# Patient Record
Sex: Female | Born: 1960 | Race: White | Hispanic: No | Smoking: Never smoker
Health system: Southern US, Community
[De-identification: ages and names within clinical notes are randomized; demographics above are authoritative.]

## PROBLEM LIST (undated history)

## (undated) DIAGNOSIS — M810 Age-related osteoporosis without current pathological fracture: Secondary | ICD-10-CM

## (undated) DIAGNOSIS — M1990 Unspecified osteoarthritis, unspecified site: Secondary | ICD-10-CM

## (undated) DIAGNOSIS — F332 Major depressive disorder, recurrent severe without psychotic features: Secondary | ICD-10-CM

## (undated) DIAGNOSIS — D649 Anemia, unspecified: Secondary | ICD-10-CM

## (undated) HISTORY — PX: KNEE SURGERY: SHX244

## (undated) HISTORY — PX: HAND SURGERY: SHX662

## (undated) HISTORY — PX: NECK SURGERY: SHX720

## (undated) HISTORY — PX: FOOT SURGERY: SHX648

## (undated) HISTORY — PX: HIP FRACTURE SURGERY: SHX118

---

## 2008-05-29 ENCOUNTER — Other Ambulatory Visit: Payer: Self-pay | Admitting: Emergency Medicine

## 2008-05-29 ENCOUNTER — Ambulatory Visit: Payer: Self-pay | Admitting: Psychiatry

## 2008-05-29 ENCOUNTER — Inpatient Hospital Stay (HOSPITAL_COMMUNITY): Admission: RE | Admit: 2008-05-29 | Discharge: 2008-06-05 | Payer: Self-pay | Admitting: Psychiatry

## 2008-08-20 DIAGNOSIS — F1120 Opioid dependence, uncomplicated: Secondary | ICD-10-CM | POA: Insufficient documentation

## 2009-03-14 ENCOUNTER — Emergency Department (HOSPITAL_BASED_OUTPATIENT_CLINIC_OR_DEPARTMENT_OTHER): Admission: EM | Admit: 2009-03-14 | Discharge: 2009-03-14 | Payer: Self-pay | Admitting: Emergency Medicine

## 2009-03-14 ENCOUNTER — Ambulatory Visit: Payer: Self-pay | Admitting: Radiology

## 2009-08-10 ENCOUNTER — Ambulatory Visit: Payer: Self-pay | Admitting: Diagnostic Radiology

## 2009-08-10 ENCOUNTER — Emergency Department (HOSPITAL_BASED_OUTPATIENT_CLINIC_OR_DEPARTMENT_OTHER): Admission: EM | Admit: 2009-08-10 | Discharge: 2009-08-10 | Payer: Self-pay | Admitting: Emergency Medicine

## 2010-05-05 ENCOUNTER — Ambulatory Visit: Payer: Self-pay | Admitting: Diagnostic Radiology

## 2010-05-05 ENCOUNTER — Emergency Department (HOSPITAL_BASED_OUTPATIENT_CLINIC_OR_DEPARTMENT_OTHER): Admission: EM | Admit: 2010-05-05 | Discharge: 2010-05-06 | Payer: Self-pay | Admitting: Emergency Medicine

## 2010-05-14 ENCOUNTER — Emergency Department (HOSPITAL_BASED_OUTPATIENT_CLINIC_OR_DEPARTMENT_OTHER): Admission: EM | Admit: 2010-05-14 | Discharge: 2010-05-14 | Payer: Self-pay | Admitting: Emergency Medicine

## 2011-01-24 NOTE — H&P (Signed)
NAMEJAMMI, Tara Brown             ACCOUNT NO.:  [id number]   MEDICAL RECORD NO.:  [id number]          PATIENT TYPE:  IPS   LOCATION:  0302                          FACILITY:  [facility]   PHYSICIAN:  Anselm Jungling, MD  DATE OF BIRTH:  10-13-1960   DATE OF ADMISSION:  05/29/2008  DATE OF DISCHARGE:                       PSYCHIATRIC ADMISSION ASSESSMENT   IDENTIFYING INFORMATION:  This is a 50 year old divorced white female.  She presented at [facility] yesterday. She reports that she  started taking her Percocet too often. She has gotten several refills.  She doesn't trust herself and she is requesting a detox. The patient has  severe rheumatoid arthritis.  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   PAST PSYCHIATRIC HISTORY:  She denies having any.   SOCIAL HISTORY:  She reports having two bachelors and an associates  degree. She has been married and divorced once. She has no children.  She was last employed 3/41/2009.  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 She  does get SSI. She states that she is in the process of reworking with  vocational rehab.   FAMILY HISTORY:  Her father drank alcohol. Her own alcohol and drug  history; she was being given Vicodin. She did have a total knee  replacement in June of 2009. She was switched to Percocet and she  reports abusing the Percocet of late.   PRIMARY CARE [place]:  She has just moved to this area. She does not  have a primary care person yet.   MEDICAL PROBLEMS:  She does have severe rheumatoid arthritis. She is  status post right total knee replacement June of 2009 at [facility] in  [place].  She also has osteoporosis and currently some depression.   MEDICATIONS:  As already stated she was prescribed Percocet 5/325 one to  two q8 hours prn. We are going to stop that and Fosamax 70 mg po weekly.  She takes it on  Fridays. She was last due yesterday and so we will need  to give her that.   DRUG ALLERGIES:  No known drug allergies.   POSITIVE PHYSICAL FINDINGS:  She does have multiple joint involvement  with her rheumatoid arthritis in her hands and feet. She has an old scar  in her left hip area and her right knee. She is also status post a  cervical fusion and right knee replacement. She has had an iliac repair  and she has had a broken hip times three, the last being in 1996.  She  is also anemic and hemoglobin today is 9. Her vital signs on admission  to our unit shows she is 67 tall, weighs 142, temperature is 98.4,  blood pressure 126/81 to 134/80, pulse is 69 to 78, respirations are 16.   MENTAL STATUS EXAM:  Today she is alert and oriented.  She is casually  but appropriately groomed, dressed and appears to be adequately hydrated  and nourished. Her speech was soft and slow. Her mood  is appropriate to  the situation. Her affect had a normal range. Her thought processes are  clear, rational and goal oriented. She wants to get back to work.  Judgment and insight are good. Concentration and memory are intact.  Intelligence is at least average.  She denies being suicidal or  homicidal. She denies auditory or visual hallucinations.   Axis I:  Opioid abuse, rule out dependence.  Grief reaction, mother just  died.  Depression from chronic medical illnesses.  Axis II:  Deferred.  Axis III:  Recent total knee surgery June of 2009 at [place] in  [place]. Rheumatoid arthritis with severe joint involvement of hands  and feet.  Osteoporosis. Fracture of left hip times three and cervical  fusion.  Axis IV:  Moderate. She has just moved to this area so she doesn't have  a lot of support although she does have a sister that lives in [place].  Axis V:  60.   The plan is to admit for help to support through detox from opiates.  Toward that end she was started on the Clonidine protocol. She had   already spoken with Dr. Dub Mikes and they had agreed to start some Cymbalta  to help with her depression and pain. Estimated length of stay is five  days.      Mickie Leonarda Salon, P.A.-C.      Anselm Jungling, MD  Electronically Signed    MD/MEDQ  D:  05/30/2008  T:  05/30/2008  Job:  [id number]

## 2011-01-27 NOTE — Discharge Summary (Signed)
NAMEBRISSIA, Tara NO.:  [id number]   MEDICAL RECORD NO.:  [id number]          PATIENT TYPE:  IPS   LOCATION:  0302                          FACILITY:  [facility]   PHYSICIAN:  Anselm Jungling, MD  DATE OF BIRTH:  [birth year]   DATE OF ADMISSION:  05/29/2008  DATE OF DISCHARGE:  06/05/2008                               DISCHARGE SUMMARY   IDENTIFYING DATA/REASON FOR ADMISSION:  The patient is a 50 year old  divorced Caucasian female who had presented at [facility],  reporting that she was overusing her prescription Percocet.  She  requested detoxification.  She had been taking Percocet for rheumatoid  arthritis.  Please refer to the admission note for further details  pertaining to the symptoms, circumstances and history that led to  hospitalization.  She was given an initial Axis I diagnosis of opiate  abuse, rule out dependence.   MEDICAL AND LABORATORY:  The patient was medically and physically  assessed by the psychiatric physician's assistant.  She was continued on  her usual Fosamax, but instructed to stop taking Indocin because of  concern about gastrointestinal bleeding.  At the time of discharge, the  patient was instructed to follow up with her rheumatologist in [facility], with an appointment on November 2.  She was also referred to an  orthopedist, Dr. Lequita Halt at [facility] for followup care  regarding orthopedic issues.   HOSPITAL COURSE:  The patient was admitted to the adult inpatient  psychiatric service.  She presented as a well-nourished, normally-  developed adult female who is alert and oriented.  Her mood was  appropriate and her affect showed normal range.  Her thought processes  were clear and rational.  Judgment and insight were felt to be good.   She was placed on a withdrawal protocol based upon [place].  She was  involved in the therapeutic milieu.  She was a good participant in the  treatment program.  She was  initially seen by Dr. Geoffery Lyons, and  following this, the undersigned assumed her care.  She was continued on  Cymbalta, and tolerated this without side effects.  Case management  worked with the patient closely, and family session occurred involving  the patient and her sister.   By the sixth hospital day, the patient stated that she felt a lot  better.  She also felt encouraged because her sister had been very  supportive in the family meeting.  Sleep and appetite were improving.   The patient was discharged on the eighth hospital day.  At that time she  had completed the detoxification process, and was stable with respect to  mood and thinking.   She was determined to have some low blood indices suggesting anemia, and  the possibility of GI bleeding.  As such, she was instructed to stop  Indocin and referred to appropriate medical followup as referred to  above.   AFTERCARE:  The patient was to follow up for mental health issues with  the [facility] in [facility] with an appointment on September 28.  She was also referred to  [facility], appointment to  be arranged by the patient.   DISCHARGE MEDICATIONS:  1. Cymbalta 30 mg daily.  2. Fosamax 70 mg every Friday.  3. Iron and vitamin supplements daily.  4. Ambien 10 mg at bedtime as needed for sleep.  The patient was      instructed to stop Indocin.   DISCHARGE DIAGNOSES:  Axis I: Opioid abuse, status post opioid  withdrawal.  Axis II: Deferred.  Axis III: Rheumatoid arthritis, status post joint replacement, chronic  pain NOS and anemia, NOS.  Axis IV: Stressors severe.  Axis V: GAF on discharge 60.      Anselm Jungling, MD  Electronically Signed     SPB/MEDQ  D:  06/17/2008  T:  06/17/2008  Job:  [id number]

## 2011-02-23 ENCOUNTER — Ambulatory Visit: Payer: Self-pay | Admitting: Physical Therapy

## 2011-02-28 ENCOUNTER — Ambulatory Visit: Payer: Self-pay | Admitting: Physical Therapy

## 2011-03-02 ENCOUNTER — Emergency Department (INDEPENDENT_AMBULATORY_CARE_PROVIDER_SITE_OTHER): Payer: Medicare Other

## 2011-03-02 ENCOUNTER — Emergency Department (HOSPITAL_BASED_OUTPATIENT_CLINIC_OR_DEPARTMENT_OTHER)
Admission: EM | Admit: 2011-03-02 | Discharge: 2011-03-02 | Disposition: A | Discharge status: Home / Self Care | Payer: Medicare Other | Attending: Emergency Medicine | Admitting: Emergency Medicine

## 2011-03-02 DIAGNOSIS — M79609 Pain in unspecified limb: Secondary | ICD-10-CM

## 2011-03-02 DIAGNOSIS — F341 Dysthymic disorder: Secondary | ICD-10-CM | POA: Insufficient documentation

## 2011-03-02 DIAGNOSIS — M84369A Stress fracture, unspecified tibia and fibula, initial encounter for fracture: Secondary | ICD-10-CM

## 2011-03-02 DIAGNOSIS — X58XXXA Exposure to other specified factors, initial encounter: Secondary | ICD-10-CM

## 2011-03-02 DIAGNOSIS — Z8739 Personal history of other diseases of the musculoskeletal system and connective tissue: Secondary | ICD-10-CM | POA: Insufficient documentation

## 2011-03-02 DIAGNOSIS — M810 Age-related osteoporosis without current pathological fracture: Secondary | ICD-10-CM | POA: Insufficient documentation

## 2011-03-02 DIAGNOSIS — S82209A Unspecified fracture of shaft of unspecified tibia, initial encounter for closed fracture: Secondary | ICD-10-CM | POA: Insufficient documentation

## 2011-06-12 LAB — DIFFERENTIAL
Basophils Absolute: 0
Basophils Relative: 0
Eosinophils Absolute: 0
Eosinophils Relative: 0
Lymphocytes Relative: 15
Lymphs Abs: 1.1
Monocytes Absolute: 0.4
Monocytes Relative: 5
Neutro Abs: 6.1
Neutrophils Relative %: 80 — ABNORMAL HIGH

## 2011-06-12 LAB — BASIC METABOLIC PANEL WITH GFR
CO2: 25
Calcium: 9.5
Chloride: 105
Creatinine, Ser: 0.8
Glucose, Bld: 109 — ABNORMAL HIGH
Sodium: 141

## 2011-06-12 LAB — BASIC METABOLIC PANEL
BUN: 11
GFR calc Af Amer: >60
GFR calc non Af Amer: >60
Potassium: 4.1

## 2011-06-12 LAB — CBC
HCT: 27.3 — ABNORMAL LOW
Hemoglobin: 8.9 — ABNORMAL LOW
MCHC: 32.5
MCV: 70.6 — ABNORMAL LOW
Platelets: 383
RBC: 3.86 — ABNORMAL LOW
RDW: 18 — ABNORMAL HIGH
WBC: 7.6

## 2011-06-12 LAB — POCT TOXICOLOGY PANEL: Opiates: POSITIVE

## 2011-06-12 LAB — ETHANOL: Alcohol, Ethyl (B): <10

## 2013-10-07 ENCOUNTER — Emergency Department (HOSPITAL_BASED_OUTPATIENT_CLINIC_OR_DEPARTMENT_OTHER): Payer: Medicare Other

## 2013-10-07 ENCOUNTER — Emergency Department (HOSPITAL_BASED_OUTPATIENT_CLINIC_OR_DEPARTMENT_OTHER)
Admission: EM | Admit: 2013-10-07 | Discharge: 2013-10-07 | Disposition: A | Discharge status: Home / Self Care | Payer: Medicare Other | Attending: Emergency Medicine | Admitting: Emergency Medicine

## 2013-10-07 ENCOUNTER — Encounter (HOSPITAL_BASED_OUTPATIENT_CLINIC_OR_DEPARTMENT_OTHER): Payer: Self-pay | Admitting: Emergency Medicine

## 2013-10-07 DIAGNOSIS — Z8781 Personal history of (healed) traumatic fracture: Secondary | ICD-10-CM | POA: Insufficient documentation

## 2013-10-07 DIAGNOSIS — M25469 Effusion, unspecified knee: Secondary | ICD-10-CM | POA: Insufficient documentation

## 2013-10-07 DIAGNOSIS — M1990 Unspecified osteoarthritis, unspecified site: Secondary | ICD-10-CM

## 2013-10-07 DIAGNOSIS — M25569 Pain in unspecified knee: Secondary | ICD-10-CM | POA: Insufficient documentation

## 2013-10-07 DIAGNOSIS — M25562 Pain in left knee: Secondary | ICD-10-CM

## 2013-10-07 DIAGNOSIS — M069 Rheumatoid arthritis, unspecified: Secondary | ICD-10-CM | POA: Insufficient documentation

## 2013-10-07 HISTORY — DX: Unspecified osteoarthritis, unspecified site: M19.90

## 2013-10-07 HISTORY — DX: Age-related osteoporosis without current pathological fracture: M81.0

## 2013-10-07 MED ORDER — OXYCODONE-ACETAMINOPHEN 5-325 MG PO TABS: 1.0000 | ORAL_TABLET | ORAL | Status: DC | PRN

## 2013-10-07 MED ORDER — MORPHINE SULFATE 4 MG/ML IJ SOLN
4.0000 mg | Freq: Once | INTRAMUSCULAR | Status: AC
Start: 2013-10-07 — End: 2013-10-07
  Administered 2013-10-07: 4 mg via INTRAMUSCULAR
  Filled 2013-10-07: qty 1

## 2013-10-07 NOTE — ED Notes (Signed)
C/o pain to left knee while walking-feels possible fractured due to hx severe osteoporosis

## 2013-10-07 NOTE — ED Provider Notes (Signed)
TIME SEEN: 4:15 PM  CHIEF COMPLAINT: Left knee pain  HPI: Patient is a 53 y.o. female with a history of severe osteoporosis and rheumatoid arthritis who has had multiple fractures requiring surgery who presents the emergency room with sudden onset left knee pain while walking. No history of injury. She's had swelling to the knee. No redness or warmth. No fevers or chills. No numbness, tingling or focal weakness. She's able to bear weight but it is painful. Pain radiates to the posterior knee. Pain is worse with walking. Better with rest. Described a sharp, throbbing, moderate.  ROS: See HPI Constitutional: no fever  Eyes: no drainage  ENT: no runny nose   Cardiovascular:  no chest pain  Resp: no SOB  GI: no vomiting GU: no dysuria Integumentary: no rash  Allergy: no hives  Musculoskeletal: no leg swelling  Neurological: no slurred speech ROS otherwise negative  PAST MEDICAL HISTORY/PAST SURGICAL HISTORY:  Past Medical History  Diagnosis Date  . Osteoporosis   . Arthritis   . Rheumatoid arthritis     MEDICATIONS:  Prior to Admission medications   Medication Sig Start Date End Date Taking? Authorizing Provider  Escitalopram Oxalate (LEXAPRO PO) Take by mouth.   Yes Historical Provider, MD  Etanercept (ENBREL [place]) Inject into the skin.   Yes Historical Provider, MD  LISINOPRIL PO Take by mouth.   Yes Historical Provider, MD  Omeprazole (PRILOSEC PO) Take by mouth.   Yes Historical Provider, MD  PREDNISONE PO Take by mouth.   Yes Historical Provider, MD  TRAMADOL HCL PO Take by mouth.   Yes Historical Provider, MD    ALLERGIES:  No Known Allergies  SOCIAL HISTORY:  History  Substance Use Topics  . Smoking status: Never Smoker   . Smokeless tobacco: Not on file  . Alcohol Use: No    FAMILY HISTORY: No family history on file.  EXAM: BP 127/66  Pulse 64  Temp(Src) 98.2 F (36.8 C) (Oral)  Resp 18  Ht 5\' 7"  (1.702 m)  Wt 170 lb (77.111 kg)  BMI 26.62 kg/m2  SpO2  97% CONSTITUTIONAL: Alert and oriented and responds appropriately to questions. Well-appearing; well-nourished HEAD: Normocephalic EYES: Conjunctivae clear, PERRL ENT: normal nose; no rhinorrhea; moist mucous membranes; pharynx without lesions noted NECK: Supple, no meningismus, no LAD  CARD: RRR; S1 and S2 appreciated; no murmurs, no clicks, no rubs, no gallops RESP: Normal chest excursion without splinting or tachypnea; breath sounds clear and equal bilaterally; no wheezes, no rhonchi, no rales,  ABD/GI: Normal bowel sounds; non-distended; soft, non-tender, no rebound, no guarding BACK:  The back appears normal and is non-tender to palpation, there is no CVA tenderness EXT: Vision is tenderness palpation of the left anterior knee with moderate joint effusion with no erythema or warmth or induration or fluctuance, no ligamentous laxity, 2+ DP pulses bilaterally, sensation to light touch intact diffusely, patient has full range of motion in the left knee but full extension and flexion cause significant pain, otherwise Normal ROM in all joints; non-tender to palpation; no edema; normal capillary refill; no cyanosis    SKIN: Normal color for age and race; warm NEURO: Moves all extremities equally PSYCH: The patient's mood and manner are appropriate. Grooming and personal hygiene are appropriate.  MEDICAL DECISION MAKING: Patient here with left knee pain. She has severe arthritis and joint effusion but no signs of septic arthritis. X-ray shows no fracture. Patient is very concerned that she may have an acute fracture not seen on x-ray  given her severe osteoporosis. We'll give pain medication and obtain CT scan to evaluate for fracture  ED PROGRESS: Patient CT scan shows tricompartmental also arthritis but no fracture or dislocation. There is also moderate joint effusion. No subcutaneous air. Patient is very pleased she doesn't have a fracture. We'll discharge him with return precautions. She has an  orthopedist for followup. She has crutches at home. We'll give pain medication. Given return precautions. Patient verbalizes understanding and is comfortable with plan.     Layla Maw Rashana Andrew, DO 10/07/13 1724

## 2013-10-07 NOTE — Discharge Instructions (Signed)
Arthritis, Nonspecific Arthritis is inflammation of a joint. This usually means pain, redness, warmth or swelling are present. One or more joints may be involved. There are a number of types of arthritis. Your caregiver may not be able to tell what type of arthritis you have right away. CAUSES  The most common cause of arthritis is the wear and tear on the joint (osteoarthritis). This causes damage to the cartilage, which can break down over time. The knees, hips, back and neck are most often affected by this type of arthritis. Other types of arthritis and common causes of joint pain include:  Sprains and other injuries near the joint. Sometimes minor sprains and injuries cause pain and swelling that develop hours later.  Rheumatoid arthritis. This affects hands, feet and knees. It usually affects both sides of your body at the same time. It is often associated with chronic ailments, fever, weight loss and general weakness.  Crystal arthritis. Gout and pseudo gout can cause occasional acute severe pain, redness and swelling in the foot, ankle, or knee.  Infectious arthritis. Bacteria can get into a joint through a break in overlying skin. This can cause infection of the joint. Bacteria and viruses can also spread through the blood and affect your joints.  Drug, infectious and allergy reactions. Sometimes joints can become mildly painful and slightly swollen with these types of illnesses. SYMPTOMS   Pain is the main symptom.  Your joint or joints can also be red, swollen and warm or hot to the touch.  You may have a fever with certain types of arthritis, or even feel overall ill.  The joint with arthritis will hurt with movement. Stiffness is present with some types of arthritis. DIAGNOSIS  Your caregiver will suspect arthritis based on your description of your symptoms and on your exam. Testing may be needed to find the type of arthritis:  Blood and sometimes urine tests.  X-ray tests  and sometimes CT or MRI scans.  Removal of fluid from the joint (arthrocentesis) is done to check for bacteria, crystals or other causes. Your caregiver (or a specialist) will numb the area over the joint with a local anesthetic, and use a needle to remove joint fluid for examination. This procedure is only minimally uncomfortable.  Even with these tests, your caregiver may not be able to tell what kind of arthritis you have. Consultation with a specialist (rheumatologist) may be helpful. TREATMENT  Your caregiver will discuss with you treatment specific to your type of arthritis. If the specific type cannot be determined, then the following general recommendations may apply. Treatment of severe joint pain includes:  Rest.  Elevation.  Anti-inflammatory medication (for example, ibuprofen) may be prescribed. Avoiding activities that cause increased pain.  Only take over-the-counter or prescription medicines for pain and discomfort as recommended by your caregiver.  Cold packs over an inflamed joint may be used for 10 to 15 minutes every hour. Hot packs sometimes feel better, but do not use overnight. Do not use hot packs if you are diabetic without your caregiver's permission.  A cortisone shot into arthritic joints may help reduce pain and swelling.  Any acute arthritis that gets worse over the next 1 to 2 days needs to be looked at to be sure there is no joint infection. Long-term arthritis treatment involves modifying activities and lifestyle to reduce joint stress jarring. This can include weight loss. Also, exercise is needed to nourish the joint cartilage and remove waste. This helps keep the muscles  around the joint strong. HOME CARE INSTRUCTIONS   Do not take aspirin to relieve pain if gout is suspected. This elevates uric acid levels.  Only take over-the-counter or prescription medicines for pain, discomfort or fever as directed by your caregiver.  Rest the joint as much as  possible.  If your joint is swollen, keep it elevated.  Use crutches if the painful joint is in your leg.  Drinking plenty of fluids may help for certain types of arthritis.  Follow your caregiver's dietary instructions.  Try low-impact exercise such as:  Swimming.  Water aerobics.  Biking.  Walking.  Morning stiffness is often relieved by a warm shower.  Put your joints through regular range-of-motion. SEEK MEDICAL CARE IF:   You do not feel better in 24 hours or are getting worse.  You have side effects to medications, or are not getting better with treatment. SEEK IMMEDIATE MEDICAL CARE IF:   You have a fever.  You develop severe joint pain, swelling or redness.  Many joints are involved and become painful and swollen.  There is severe back pain and/or leg weakness.  You have loss of bowel or bladder control. Document Released: 10/05/2004 Document Revised: 11/20/2011 Document Reviewed: 10/21/2008 [facility] Patient Information 2014 [place].  Knee Effusion The medical term for having fluid in your knee is effusion. This is often due to an internal derangement of the knee. This means something is wrong inside the knee. Some of the causes of fluid in the knee may be torn cartilage, a torn ligament, or bleeding into the joint from an injury. Your knee is likely more difficult to bend and move. This is often because there is increased pain and pressure in the joint. The time it takes for recovery from a knee effusion depends on different factors, including:   Type of injury.  Your age.  Physical and medical conditions.  Rehabilitation Strategies. How long you will be away from your normal activities will depend on what kind of knee problem you have and how much damage is present. Your knee has two types of cartilage. Articular cartilage covers the bone ends and lets your knee bend and move smoothly. Two menisci, thick pads of cartilage that form a rim inside  the joint, help absorb shock and stabilize your knee. Ligaments bind the bones together and support your knee joint. Muscles move the joint, help support your knee, and take stress off the joint itself. CAUSES  Often an effusion in the knee is caused by an injury to one of the menisci. This is often a tear in the cartilage. Recovery after a meniscus injury depends on how much meniscus is damaged and whether you have damaged other knee tissue. Small tears may heal on their own with conservative treatment. Conservative means rest, limited weight bearing activity and muscle strengthening exercises. Your recovery may take up to 6 weeks.  TREATMENT  Larger tears may require surgery. Meniscus injuries may be treated during arthroscopy. Arthroscopy is a procedure in which your surgeon uses a small telescope like instrument to look in your knee. Your caregiver can make a more accurate diagnosis (learning what is wrong) by performing an arthroscopic procedure. If your injury is on the inner margin of the meniscus, your surgeon may trim the meniscus back to a smooth rim. In other cases your surgeon will try to repair a damaged meniscus with stitches (sutures). This may make rehabilitation take longer, but may provide better long term result by helping your knee  keep its shock absorption capabilities. Ligaments which are completely torn usually require surgery for repair. HOME CARE INSTRUCTIONS  Use crutches as instructed.  If a brace is applied, use as directed.  Once you are home, an ice pack applied to your swollen knee may help with discomfort and help decrease swelling.  Keep your knee raised (elevated) when you are not up and around or on crutches.  Only take over-the-counter or prescription medicines for pain, discomfort, or fever as directed by your caregiver.  Your caregivers will help with instructions for rehabilitation of your knee. This often includes strengthening exercises.  You may resume a  normal diet and activities as directed. SEEK MEDICAL CARE IF:   There is increased swelling in your knee.  You notice redness, swelling, or increasing pain in your knee.  An unexplained oral temperature above 102 F (38.9 C) develops. SEEK IMMEDIATE MEDICAL CARE IF:   You develop a rash.  You have difficulty breathing.  You have any allergic reactions from medications you may have been given.  There is severe pain with any motion of the knee. MAKE SURE YOU:   Understand these instructions.  Will watch your condition.  Will get help right away if you are not doing well or get worse. Document Released: 11/18/2003 Document Revised: 11/20/2011 Document Reviewed: 01/22/2008 [facility] Patient Information 2014 [place], [place].  Knee Pain Knee pain can be a result of an injury or other medical conditions. Treatment will depend on the cause of your pain. HOME CARE  Only take medicine as told by your doctor.  Keep a healthy weight. Being overweight can make the knee hurt more.  Stretch before exercising or playing sports.  If there is constant knee pain, change the way you exercise. Ask your doctor for advice.  Make sure shoes fit well. Choose the right shoe for the sport or activity.  Protect your knees. Wear kneepads if needed.  Rest when you are tired. GET HELP RIGHT AWAY IF:   Your knee pain does not stop.  Your knee pain does not get better.  Your knee joint feels hot to the touch.  You have a fever. MAKE SURE YOU:   Understand these instructions.  Will watch this condition.  Will get help right away if you are not doing well or get worse. Document Released: 11/24/2008 Document Revised: 11/20/2011 Document Reviewed: 11/24/2008 [facility] Patient Information 2014 [place], [place]. RICE: Routine Care for Injuries The routine care of many injuries includes Rest, Ice, Compression, and Elevation (RICE). HOME CARE INSTRUCTIONS  Rest is needed to allow your body to  heal. Routine activities can usually be resumed when comfortable. Injured tendons and bones can take up to 6 weeks to heal. Tendons are the cord-like structures that attach muscle to bone.  Ice following an injury helps keep the swelling down and reduces pain.  Put ice in a plastic bag.  Place a towel between your skin and the bag.  Leave the ice on for 15-20 minutes, 03-04 times a day. Do this while awake, for the first 24 to 48 hours. After that, continue as directed by your caregiver.  Compression helps keep swelling down. It also gives support and helps with discomfort. If an elastic bandage has been applied, it should be removed and reapplied every 3 to 4 hours. It should not be applied tightly, but firmly enough to keep swelling down. Watch fingers or toes for swelling, bluish discoloration, coldness, numbness, or excessive pain. If any of these problems occur,  remove the bandage and reapply loosely. Contact your caregiver if these problems continue.  Elevation helps reduce swelling and decreases pain. With extremities, such as the arms, hands, legs, and feet, the injured area should be placed near or above the level of the heart, if possible. SEEK IMMEDIATE MEDICAL CARE IF:  You have persistent pain and swelling.  You develop redness, numbness, or unexpected weakness.  Your symptoms are getting worse rather than improving after several days. These symptoms may indicate that further evaluation or further X-rays are needed. Sometimes, X-rays may not show a small broken bone (fracture) until 1 week or 10 days later. Make a follow-up appointment with your caregiver. Ask when your X-ray results will be ready. Make sure you get your X-ray results. Document Released: 12/10/2000 Document Revised: 11/20/2011 Document Reviewed: 01/27/2011 [facility] Patient Information 2014 [place], [place].

## 2013-12-08 DIAGNOSIS — Z79899 Other long term (current) drug therapy: Secondary | ICD-10-CM | POA: Insufficient documentation

## 2013-12-08 DIAGNOSIS — M65939 Unspecified synovitis and tenosynovitis, unspecified forearm: Secondary | ICD-10-CM | POA: Insufficient documentation

## 2013-12-08 DIAGNOSIS — L4050 Arthropathic psoriasis, unspecified: Secondary | ICD-10-CM | POA: Insufficient documentation

## 2013-12-08 DIAGNOSIS — M659 Synovitis and tenosynovitis, unspecified: Secondary | ICD-10-CM | POA: Insufficient documentation

## 2015-01-08 DIAGNOSIS — M75101 Unspecified rotator cuff tear or rupture of right shoulder, not specified as traumatic: Secondary | ICD-10-CM | POA: Insufficient documentation

## 2015-02-03 DIAGNOSIS — M19011 Primary osteoarthritis, right shoulder: Secondary | ICD-10-CM | POA: Insufficient documentation

## 2015-03-07 ENCOUNTER — Inpatient Hospital Stay (HOSPITAL_BASED_OUTPATIENT_CLINIC_OR_DEPARTMENT_OTHER)
Admission: EM | Admit: 2015-03-07 | Discharge: 2015-03-11 | DRG: 871 | Disposition: A | Discharge status: Other Acute Inpatient Hospital | Payer: Medicare Other | Attending: Internal Medicine | Admitting: Internal Medicine

## 2015-03-07 ENCOUNTER — Encounter (HOSPITAL_BASED_OUTPATIENT_CLINIC_OR_DEPARTMENT_OTHER): Payer: Self-pay | Admitting: Emergency Medicine

## 2015-03-07 ENCOUNTER — Inpatient Hospital Stay (HOSPITAL_BASED_OUTPATIENT_CLINIC_OR_DEPARTMENT_OTHER): Payer: Medicare Other

## 2015-03-07 DIAGNOSIS — M810 Age-related osteoporosis without current pathological fracture: Secondary | ICD-10-CM | POA: Diagnosis present

## 2015-03-07 DIAGNOSIS — D638 Anemia in other chronic diseases classified elsewhere: Secondary | ICD-10-CM | POA: Diagnosis present

## 2015-03-07 DIAGNOSIS — N390 Urinary tract infection, site not specified: Secondary | ICD-10-CM | POA: Diagnosis present

## 2015-03-07 DIAGNOSIS — N3000 Acute cystitis without hematuria: Secondary | ICD-10-CM

## 2015-03-07 DIAGNOSIS — F332 Major depressive disorder, recurrent severe without psychotic features: Secondary | ICD-10-CM | POA: Diagnosis present

## 2015-03-07 DIAGNOSIS — R45851 Suicidal ideations: Secondary | ICD-10-CM | POA: Diagnosis present

## 2015-03-07 DIAGNOSIS — I959 Hypotension, unspecified: Secondary | ICD-10-CM | POA: Diagnosis not present

## 2015-03-07 DIAGNOSIS — Z79899 Other long term (current) drug therapy: Secondary | ICD-10-CM | POA: Diagnosis not present

## 2015-03-07 DIAGNOSIS — F322 Major depressive disorder, single episode, severe without psychotic features: Secondary | ICD-10-CM | POA: Diagnosis not present

## 2015-03-07 DIAGNOSIS — A419 Sepsis, unspecified organism: Secondary | ICD-10-CM | POA: Diagnosis not present

## 2015-03-07 DIAGNOSIS — N179 Acute kidney failure, unspecified: Secondary | ICD-10-CM | POA: Diagnosis present

## 2015-03-07 DIAGNOSIS — L4050 Arthropathic psoriasis, unspecified: Secondary | ICD-10-CM | POA: Diagnosis not present

## 2015-03-07 DIAGNOSIS — R531 Weakness: Secondary | ICD-10-CM | POA: Diagnosis not present

## 2015-03-07 DIAGNOSIS — E43 Unspecified severe protein-calorie malnutrition: Secondary | ICD-10-CM | POA: Diagnosis present

## 2015-03-07 DIAGNOSIS — F329 Major depressive disorder, single episode, unspecified: Secondary | ICD-10-CM | POA: Diagnosis not present

## 2015-03-07 DIAGNOSIS — I10 Essential (primary) hypertension: Secondary | ICD-10-CM | POA: Diagnosis present

## 2015-03-07 DIAGNOSIS — Z6820 Body mass index (BMI) 20.0-20.9, adult: Secondary | ICD-10-CM | POA: Diagnosis not present

## 2015-03-07 DIAGNOSIS — D72819 Decreased white blood cell count, unspecified: Secondary | ICD-10-CM | POA: Diagnosis present

## 2015-03-07 DIAGNOSIS — M19019 Primary osteoarthritis, unspecified shoulder: Secondary | ICD-10-CM | POA: Diagnosis present

## 2015-03-07 DIAGNOSIS — E860 Dehydration: Secondary | ICD-10-CM | POA: Diagnosis present

## 2015-03-07 DIAGNOSIS — G8929 Other chronic pain: Secondary | ICD-10-CM | POA: Diagnosis present

## 2015-03-07 HISTORY — DX: Anemia, unspecified: D64.9

## 2015-03-07 LAB — COMPREHENSIVE METABOLIC PANEL
ALT: 5 U/L — AB (ref 14–54)
ANION GAP: 15 (ref 5–15)
AST: 11 U/L — AB (ref 15–41)
Albumin: 3 g/dL — ABNORMAL LOW (ref 3.5–5.0)
Alkaline Phosphatase: 76 U/L (ref 38–126)
BUN: 9 mg/dL (ref 6–20)
CO2: 24 mmol/L (ref 22–32)
Calcium: 9 mg/dL (ref 8.9–10.3)
Chloride: 95 mmol/L — ABNORMAL LOW (ref 101–111)
Creatinine, Ser: 1.13 mg/dL — ABNORMAL HIGH (ref 0.44–1.00)
GFR calc Af Amer: >60 mL/min (ref >60)
GFR, EST NON AFRICAN AMERICAN: 54 mL/min — AB (ref >60)
GLUCOSE: 113 mg/dL — AB (ref 65–99)
POTASSIUM: 3.6 mmol/L (ref 3.5–5.1)
Sodium: 134 mmol/L — ABNORMAL LOW (ref 135–145)
Total Bilirubin: 0.8 mg/dL (ref 0.3–1.2)
Total Protein: 7.9 g/dL (ref 6.5–8.1)

## 2015-03-07 LAB — I-STAT CG4 LACTIC ACID, ED: LACTIC ACID, VENOUS: 0.85 mmol/L (ref 0.5–2.0)

## 2015-03-07 LAB — URINE MICROSCOPIC-ADD ON

## 2015-03-07 LAB — RAPID URINE DRUG SCREEN, HOSP PERFORMED
Amphetamines: NOT DETECTED
BENZODIAZEPINES: NOT DETECTED
Barbiturates: NOT DETECTED
Cocaine: NOT DETECTED
OPIATES: NOT DETECTED
Tetrahydrocannabinol: NOT DETECTED

## 2015-03-07 LAB — CBC WITH DIFFERENTIAL/PLATELET
Basophils Absolute: 0 10*3/uL (ref 0.0–0.1)
Basophils Relative: 1 % (ref 0–1)
Eosinophils Absolute: 0 10*3/uL (ref 0.0–0.7)
Eosinophils Relative: 0 % (ref 0–5)
HCT: 29.2 % — ABNORMAL LOW (ref 36.0–46.0)
Hemoglobin: 9 g/dL — ABNORMAL LOW (ref 12.0–15.0)
LYMPHS ABS: 1.1 10*3/uL (ref 0.7–4.0)
LYMPHS PCT: 22 % (ref 12–46)
MCH: 25.9 pg — AB (ref 26.0–34.0)
MCHC: 30.8 g/dL (ref 30.0–36.0)
MCV: 83.9 fL (ref 78.0–100.0)
MONO ABS: 0.9 10*3/uL (ref 0.1–1.0)
MONOS PCT: 17 % — AB (ref 3–12)
NEUTROS ABS: 2.9 10*3/uL (ref 1.7–7.7)
Neutrophils Relative %: 60 % (ref 43–77)
Platelets: 486 10*3/uL — ABNORMAL HIGH (ref 150–400)
RBC: 3.48 MIL/uL — ABNORMAL LOW (ref 3.87–5.11)
RDW: 17.5 % — ABNORMAL HIGH (ref 11.5–15.5)
WBC: 4.9 10*3/uL (ref 4.0–10.5)

## 2015-03-07 LAB — URINALYSIS, ROUTINE W REFLEX MICROSCOPIC
GLUCOSE, UA: NEGATIVE mg/dL
Ketones, ur: 15 mg/dL — AB
NITRITE: NEGATIVE
PH: 6 (ref 5.0–8.0)
PROTEIN: 30 mg/dL — AB
Specific Gravity, Urine: 1.017 (ref 1.005–1.030)
UROBILINOGEN UA: 2 mg/dL — AB (ref 0.0–1.0)

## 2015-03-07 LAB — SALICYLATE LEVEL: Salicylate Lvl: <4 mg/dL (ref 2.8–30.0)

## 2015-03-07 LAB — ACETAMINOPHEN LEVEL

## 2015-03-07 LAB — ETHANOL: Alcohol, Ethyl (B): <5 mg/dL (ref <5)

## 2015-03-07 LAB — PREGNANCY, URINE: Preg Test, Ur: NEGATIVE

## 2015-03-07 LAB — PROCALCITONIN: PROCALCITONIN: 0.16 ng/mL

## 2015-03-07 MED ORDER — ACETAMINOPHEN 650 MG RE SUPP: 650.0000 mg | Freq: Four times a day (QID) | RECTAL | Status: DC | PRN

## 2015-03-07 MED ORDER — ZOLPIDEM TARTRATE 5 MG PO TABS
5.0000 mg | ORAL_TABLET | Freq: Every evening | ORAL | Status: DC | PRN
  Administered 2015-03-08 – 2015-03-10 (×4): 5 mg via ORAL
  Filled 2015-03-07 (×4): qty 1

## 2015-03-07 MED ORDER — ENOXAPARIN SODIUM 40 MG/0.4ML ~~LOC~~ SOLN
40.0000 mg | SUBCUTANEOUS | Status: DC
  Administered 2015-03-08 – 2015-03-11 (×4): 40 mg via SUBCUTANEOUS
  Filled 2015-03-07 (×5): qty 0.4

## 2015-03-07 MED ORDER — ACETAMINOPHEN 325 MG PO TABS
650.0000 mg | ORAL_TABLET | Freq: Four times a day (QID) | ORAL | Status: DC | PRN
  Administered 2015-03-08: 650 mg via ORAL
  Filled 2015-03-07: qty 2

## 2015-03-07 MED ORDER — HYDROCORTISONE NA SUCCINATE PF 100 MG IJ SOLR
100.0000 mg | Freq: Once | INTRAMUSCULAR | Status: AC
  Administered 2015-03-07: 100 mg via INTRAVENOUS
  Filled 2015-03-07: qty 2

## 2015-03-07 MED ORDER — CEFTRIAXONE SODIUM 1 G IJ SOLR
INTRAMUSCULAR | Status: AC
  Filled 2015-03-07: qty 10

## 2015-03-07 MED ORDER — PREDNISONE 10 MG PO TABS
10.0000 mg | ORAL_TABLET | Freq: Every day | ORAL | Status: DC
  Administered 2015-03-08 – 2015-03-11 (×4): 10 mg via ORAL
  Filled 2015-03-07 (×4): qty 1

## 2015-03-07 MED ORDER — SODIUM CHLORIDE 0.9 % IV SOLN
INTRAVENOUS | Status: AC
  Administered 2015-03-08: 1000 mL via INTRAVENOUS
  Administered 2015-03-08 (×2): via INTRAVENOUS

## 2015-03-07 MED ORDER — ONDANSETRON HCL 4 MG PO TABS: 4.0000 mg | ORAL_TABLET | Freq: Four times a day (QID) | ORAL | Status: DC | PRN

## 2015-03-07 MED ORDER — HYDROCORTISONE NA SUCCINATE PF 100 MG IJ SOLR
INTRAMUSCULAR | Status: AC
  Filled 2015-03-07: qty 2

## 2015-03-07 MED ORDER — SODIUM CHLORIDE 0.9 % IV BOLUS (SEPSIS)
1000.0000 mL | Freq: Once | INTRAVENOUS | Status: AC
  Administered 2015-03-07: 1000 mL via INTRAVENOUS

## 2015-03-07 MED ORDER — DEXTROSE 5 % IV SOLN
1.0000 g | Freq: Once | INTRAVENOUS | Status: AC
  Administered 2015-03-07: 1 g via INTRAVENOUS

## 2015-03-07 MED ORDER — ONDANSETRON HCL 4 MG/2ML IJ SOLN: 4.0000 mg | Freq: Four times a day (QID) | INTRAMUSCULAR | Status: DC | PRN

## 2015-03-07 MED ORDER — PREDNISONE 10 MG PO TABS
10.0000 mg | ORAL_TABLET | Freq: Once | ORAL | Status: AC
  Administered 2015-03-07: 10 mg via ORAL
  Filled 2015-03-07: qty 1

## 2015-03-07 MED ORDER — OXYCODONE-ACETAMINOPHEN 5-325 MG PO TABS
1.0000 | ORAL_TABLET | Freq: Once | ORAL | Status: AC
  Administered 2015-03-07: 1 via ORAL
  Filled 2015-03-07: qty 1

## 2015-03-07 MED ORDER — OXYCODONE-ACETAMINOPHEN 5-325 MG PO TABS
1.0000 | ORAL_TABLET | ORAL | Status: DC | PRN
  Administered 2015-03-08 – 2015-03-11 (×13): 1 via ORAL
  Filled 2015-03-07 (×13): qty 1

## 2015-03-07 MED ORDER — HYDROCORTISONE NA SUCCINATE PF 100 MG IJ SOLR
50.0000 mg | Freq: Three times a day (TID) | INTRAMUSCULAR | Status: AC
  Administered 2015-03-08 – 2015-03-09 (×4): 50 mg via INTRAVENOUS
  Filled 2015-03-07 (×4): qty 1
  Filled 2015-03-07: qty 2

## 2015-03-07 MED ORDER — FOLIC ACID 1 MG PO TABS
1.0000 mg | ORAL_TABLET | Freq: Every day | ORAL | Status: DC
  Administered 2015-03-08 – 2015-03-11 (×4): 1 mg via ORAL
  Filled 2015-03-07 (×4): qty 1

## 2015-03-07 MED ORDER — LIP MEDEX EX OINT
TOPICAL_OINTMENT | CUTANEOUS | Status: AC
  Administered 2015-03-07: 1
  Filled 2015-03-07: qty 7

## 2015-03-07 MED ORDER — ESCITALOPRAM OXALATE 20 MG PO TABS
20.0000 mg | ORAL_TABLET | Freq: Every day | ORAL | Status: DC
  Administered 2015-03-08 – 2015-03-11 (×4): 20 mg via ORAL
  Filled 2015-03-07 (×4): qty 1

## 2015-03-07 NOTE — ED Provider Notes (Signed)
CSN: [id number]     Arrival date & time 03/07/15  1656 History   First MD Initiated Contact with Patient 03/07/15 1727     Chief Complaint  Patient presents with  . Suicidal     The history is provided by the patient. No language interpreter was used.   Tara Brown presents for evaluation of suicidal thoughts. She has a history of psoriatic arthritis since age of 68. She developed shoulder pain about 2 months ago and has had decreased ability to care for herself since then. She is no longer able to lift her arms over her head and this has resulted in her decreased ability to bathe due to arm pain.  She reports feeling depressed with decreased appetite. She can't encourage herself to eat. If she lost about 20 pounds the last several weeks. She stopped taking her medications a week ago. She states she has suicidal thoughts. She has plans to overdose or hang herself from an normal on her house. She lives alone. She comes in because her sisters brought her in today.  Past Medical History  Diagnosis Date  . Osteoporosis   . Psoriatic arthritis   . Anemia    Past Surgical History  Procedure Laterality Date  . Hip fracture surgery    . Knee surgery    . Foot surgery    . Hand surgery    . Neck surgery     History reviewed. No pertinent family history. History  Substance Use Topics  . Smoking status: Never Smoker   . Smokeless tobacco: Not on file  . Alcohol Use: No   OB History    No data available     Review of Systems  All other systems reviewed and are negative.     Allergies  Review of patient's allergies indicates no known allergies.  Home Medications   Prior to Admission medications   Medication Sig Start Date End Date Taking? Authorizing Provider  Escitalopram Oxalate (LEXAPRO PO) Take by mouth.    Historical Provider, MD  Etanercept (ENBREL [place]) Inject into the skin.    Historical Provider, MD  LISINOPRIL PO Take by mouth.    Historical Provider, MD  Omeprazole  (PRILOSEC PO) Take by mouth.    Historical Provider, MD  oxyCODONE-acetaminophen (PERCOCET/ROXICET) 5-325 MG per tablet Take 1-2 tablets by mouth every 4 (four) hours as needed. 10/07/13   Kristen N Ward, DO  PREDNISONE PO Take by mouth.    Historical Provider, MD  TRAMADOL HCL PO Take by mouth.    Historical Provider, MD   BP 86/58 mmHg  Pulse 102  Temp(Src) 98.6 F (37 C) (Oral)  Resp 18  Ht 5\' 6"  (1.676 m)  Wt 125 lb (56.7 kg)  BMI 20.19 kg/m2  SpO2 100% Physical Exam  Constitutional: She is oriented to person, place, and time. She appears well-developed and well-nourished.  HENT:  Head: Normocephalic and atraumatic.  Cardiovascular: Normal rate and regular rhythm.   No murmur heard. Pulmonary/Chest: Effort normal and breath sounds normal. No respiratory distress.  Abdominal: Soft. There is no tenderness. There is no rebound and no guarding.  Musculoskeletal:  Deformities of bilateral hands, wrists  Neurological: She is alert and oriented to person, place, and time.  Skin: Skin is warm and dry. There is pallor.  Psychiatric:  depressed  Nursing note and vitals reviewed.   ED Course  Procedures (including critical care time) Labs Review Labs Reviewed  CBC WITH DIFFERENTIAL/PLATELET - Abnormal; Notable for the following:  RBC 3.48 (*)    Hemoglobin 9.0 (*)    HCT 29.2 (*)    MCH 25.9 (*)    RDW 17.5 (*)    Platelets 486 (*)    Monocytes Relative 17 (*)    All other components within normal limits  COMPREHENSIVE METABOLIC PANEL - Abnormal; Notable for the following:    Sodium 134 (*)    Chloride 95 (*)    Glucose, Bld 113 (*)    Creatinine, Ser 1.13 (*)    Albumin 3.0 (*)    AST 11 (*)    ALT 5 (*)    GFR calc non Af Amer 54 (*)    All other components within normal limits  URINALYSIS, ROUTINE W REFLEX MICROSCOPIC (NOT AT [facility]) - Abnormal; Notable for the following:    Color, Urine AMBER (*)    APPearance CLOUDY (*)    Hgb urine dipstick LARGE (*)     Bilirubin Urine LARGE (*)    Ketones, ur 15 (*)    Protein, ur 30 (*)    Urobilinogen, UA 2.0 (*)    Leukocytes, UA SMALL (*)    All other components within normal limits  ACETAMINOPHEN LEVEL - Abnormal; Notable for the following:    Acetaminophen (Tylenol), Serum <10 (*)    All other components within normal limits  URINE MICROSCOPIC-ADD ON - Abnormal; Notable for the following:    Squamous Epithelial / LPF FEW (*)    Bacteria, UA MANY (*)    Casts HYALINE CASTS (*)    All other components within normal limits  URINE CULTURE  CULTURE, BLOOD (ROUTINE X 2)  CULTURE, BLOOD (ROUTINE X 2)  MRSA PCR SCREENING  URINE RAPID DRUG SCREEN, HOSP PERFORMED  ETHANOL  PREGNANCY, URINE  SALICYLATE LEVEL  PROCALCITONIN  I-STAT CG4 LACTIC ACID, ED    Imaging Review Dg Chest 2 View  03/07/2015   CLINICAL DATA:  Decreased appetite, depression common 20 lb weight loss in 6 weeks  EXAM: CHEST  2 VIEW  COMPARISON:  Shoulder radiograph 12/23/2014  FINDINGS: Mild cardiac enlargement. Moderate to severe sigmoid scoliosis thoracolumbar spine. Vascular pattern normal. Lungs clear. Multiple bilateral rib deformities suggesting prior fractures. 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 Evidence of bone infarct versus enchondroma proximal right humerus stable.  IMPRESSION: Multiple nonacute findings   Electronically Signed   By: Esperanza Heir M.D.   On: 03/07/2015 21:15     EKG Interpretation None      MDM   Final diagnoses:  Suicidal thoughts  Acute UTI    Patient here for evaluation of suicidal thoughts with plan. Patient has significant depressive symptoms with decreased oral intake, stopped taking her medication a week ago which include prednisone. She was height though tented on ED arrival, repeat blood pressure before intervention was improved. Patient was given her him steroid dose. During her ED stay she did spike a fever. UA is concerning for urinary tract infection,  treated with Rocephin and cultures sent. She does appear mildly dehydrated looking at her BMP. CBC with stable anemia. Discussed with hospitalist regarding admission for dehydration and urinary tract infection. Patient will need psychiatry evaluation given her ongoing suicidal ideation with plan.    Tilden Fossa, MD 03/07/15 [phone]

## 2015-03-07 NOTE — Progress Notes (Signed)
Per pt's request, pt's sister Genevie Cheshire was called and updated on plan of care. Danna Hefty, RN

## 2015-03-07 NOTE — H&P (Signed)
[facility] History and Physical  Odesser Tourangeau VVO:[id number] DOB: [birth year] DOA: 03/07/2015  Referring physician: Dr.Rees. PCP: [facility]. Specialists: Rheumatologist.  Chief Complaint: Depression and suicidal.  HPI: Tara Brown is a 54 y.o. female with history of psoriatic arthritis, hypertension and chronic anemia was brought to the ER at [facility] because of depression and suicidal ideation. As per the patient patient has not been eating well over the last 1 week because of depression as patient states her general medical condition has not been improving with regard to her joints. Patient states last week she stopped taking her Lexapro due to lack of confidence and restarted taking it again 2 days ago. Patient has not been taking her other medications and has not been eating well. Today when patient's family made a visit to her house patient was found to be depressed and weak. Patient was brought to the ER and was found to be hypotensive febrile and urine was showing features consistent with UTI. Patient otherwise denies any chest pain or shortness of breath had mild nausea and one episode of vomiting while coming to the ER. Patient did receive 2 L of normal saline bolus in the ER. Patient also got hydrocortisone stress dose as patient had not taken her prednisone for last 1 week or so. Patient states she has depression and suicidal thoughts but has not attempted or taken any overdose of her medications or any other medications.   Review of Systems: As presented in the history of presenting illness, rest negative.  Past Medical History  Diagnosis Date  . Osteoporosis   . Psoriatic arthritis   . Anemia    Past Surgical History  Procedure Laterality Date  . Hip fracture surgery    . Knee surgery    . Foot surgery    . Hand surgery    . Neck surgery     Social History:  reports that she has never smoked. She does not have any smokeless tobacco  history on file. She reports that she does not drink alcohol or use illicit drugs. Where does patient live home. Can patient participate in ADLs? Yes.  No Known Allergies  Family History:  Family History  Problem Relation Age of Onset  . Stroke Neg Hx       Prior to Admission medications   Medication Sig Start Date End Date Taking? Authorizing Provider  escitalopram (LEXAPRO) 20 MG tablet Take 20 mg by mouth daily.   Yes Historical Provider, MD  folic acid (FOLVITE) 1 MG tablet Take 1 mg by mouth daily.   Yes Historical Provider, MD  lisinopril (PRINIVIL,ZESTRIL) 40 MG tablet Take 40 mg by mouth daily.   Yes Historical Provider, MD  methotrexate (RHEUMATREX) 2.5 MG tablet Take 10 mg by mouth once a week. Caution:Chemotherapy. Protect from light.   Yes Historical Provider, MD  oxyCODONE-acetaminophen (PERCOCET/ROXICET) 5-325 MG per tablet Take 1 tablet by mouth every 4 (four) hours as needed for moderate pain or severe pain.   Yes Historical Provider, MD  predniSONE (DELTASONE) 10 MG tablet Take 10 mg by mouth daily with breakfast.   Yes Historical Provider, MD  traMADol (ULTRAM) 50 MG tablet Take 50 mg by mouth every 6 (six) hours as needed for moderate pain.   Yes Historical Provider, MD  zolpidem (AMBIEN) 5 MG tablet Take 5 mg by mouth at bedtime as needed for sleep.   Yes Historical Provider, MD  Escitalopram Oxalate (LEXAPRO PO) Take by mouth.    Historical  Provider, MD  Etanercept (ENBREL [place]) Inject into the skin.    Historical Provider, MD  LISINOPRIL PO Take by mouth.    Historical Provider, MD  Omeprazole (PRILOSEC PO) Take by mouth.    Historical Provider, MD  oxyCODONE-acetaminophen (PERCOCET/ROXICET) 5-325 MG per tablet Take 1-2 tablets by mouth every 4 (four) hours as needed. 10/07/13   Kristen N Ward, DO  PREDNISONE PO Take by mouth.    Historical Provider, MD  TRAMADOL HCL PO Take by mouth.    Historical Provider, MD    Physical Exam: Filed Vitals:   03/07/15 1920  03/07/15 2103 03/07/15 2155 03/07/15 2237  BP: 99/48 109/62 126/75 119/60  Pulse: 86 75 71 73  Temp: 99.3 F (37.4 C) 100.1 F (37.8 C)  98.8 F (37.1 C)  TempSrc: Oral Oral  Oral  Resp: 18 18 16 20   Height:    5\' 6"  (1.676 m)  Weight:    56.7 kg (125 lb)  SpO2: 97% 97% 95% 95%     General:  Moderately built and nourished.  Eyes: Anicteric no pallor.  ENT: No discharge from the ears eyes nose and mouth.  Neck: No mass felt.  Cardiovascular: S1 and S2 heard.  Respiratory: No rhonchi or crepitations.  Abdomen: Soft nontender bowel sounds present.  Skin: Small skin changes on the left first metacarpal phalangeal joint and lower extremities.  Musculoskeletal: Deformed joints from her psoriatic arthritis.  Psychiatric: Depressed and suicidal.  Neurologic: Alert awake oriented to time place and person. Moves all extremities.  Labs on Admission:  Basic Metabolic Panel:  Recent Labs Lab 03/07/15 1800  NA 134*  K 3.6  CL 95*  CO2 24  GLUCOSE 113*  BUN 9  CREATININE 1.13*  CALCIUM 9.0   Liver Function Tests:  Recent Labs Lab 03/07/15 1800  AST 11*  ALT 5*  ALKPHOS 76  BILITOT 0.8  PROT 7.9  ALBUMIN 3.0*   No results for input(s): LIPASE, AMYLASE in the last 168 hours. No results for input(s): AMMONIA in the last 168 hours. CBC:  Recent Labs Lab 03/07/15 1800  WBC 4.9  NEUTROABS 2.9  HGB 9.0*  HCT 29.2*  MCV 83.9  PLT 486*   Cardiac Enzymes: No results for input(s): CKTOTAL, CKMB, CKMBINDEX, TROPONINI in the last 168 hours.  BNP (last 3 results) No results for input(s): BNP in the last 8760 hours.  ProBNP (last 3 results) No results for input(s): PROBNP in the last 8760 hours.  CBG: No results for input(s): GLUCAP in the last 168 hours.  Radiological Exams on Admission: Dg Chest 2 View  03/07/2015   CLINICAL DATA:  Decreased appetite, depression common 20 lb weight loss in 6 weeks  EXAM: CHEST  2 VIEW  COMPARISON:  Shoulder radiograph  12/23/2014  FINDINGS: Mild cardiac enlargement. Moderate to severe sigmoid scoliosis thoracolumbar spine. Vascular pattern normal. Lungs clear. Multiple bilateral rib deformities suggesting prior fractures. 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 Evidence of bone infarct versus enchondroma proximal right humerus stable.  IMPRESSION: Multiple nonacute findings   Electronically Signed   By: 03/09/2015 M.D.   On: 03/07/2015 21:15     Assessment/Plan Active Problems:   UTI (urinary tract infection)   Suicidal thoughts   Psoriatic arthritis   Hypotension   Chronic anemia   1. Early sepsis from UTI - patient has been placed on ceftriaxone. Follow blood cultures urine cultures procalcitonin levels and lactic acid levels and continue with hydration and hold  antihypertensives for now. I have ordered formal dose of stress dose steroids. 2. Acute renal failure - probably from poor oral intake and hypotension. Continue with hydration and hold lisinopril. Follow metabolic panel closely.  3. Depression with suicidal ideation - patient has been placed with suicide precautions. Consult psychiatry in a.m. 4. Chronic anemia probably from chronic disease - follow CBC. 5. Psoriatic arthritis - holding methotrexate due to #1. Patient is on stress dose steroids followed by prednisone.   DVT Prophylaxis Lovenox.  Code Status: Full code.  Family Communication: Discussed with patient.  Disposition Plan: Admit to inpatient. Likely stay 2 days.    Claudia Greenley N. [facility] Pager [phone].  If 7PM-7AM, please contact night-coverage [email or web address] Password [facility] 03/07/2015, 11:56 PM

## 2015-03-07 NOTE — ED Notes (Addendum)
Pt reports decreased appetite and feeling "depressed about my health".  Reports difficulty with both arms and was recently told she needed shoulder replacement.  Reports difficulties with ADLs.  Reports currently living by herself.  Per sister patient has had 20 lb weight loss in the past 6 weeks.  Reports anorexia.  Patient reports currently plan to hurt herself but that she isn't going to tell anyone.  Patient states she doesn't have thoughts of hurting herself today.  Reports SI for months.

## 2015-03-07 NOTE — ED Notes (Signed)
Pt watching TV, alert, NAD, calm, cooperative, polite, interactive, resps e/u, speaking in clear complete sentences, skin W&D, "feel much better", no dyspnea noted, up to b/r with steady gait with sitter present,  VSS, [facility] here for transport.

## 2015-03-07 NOTE — ED Notes (Signed)
MD at bedside. 

## 2015-03-08 DIAGNOSIS — D72819 Decreased white blood cell count, unspecified: Secondary | ICD-10-CM

## 2015-03-08 DIAGNOSIS — N390 Urinary tract infection, site not specified: Secondary | ICD-10-CM

## 2015-03-08 DIAGNOSIS — A419 Sepsis, unspecified organism: Principal | ICD-10-CM

## 2015-03-08 DIAGNOSIS — E43 Unspecified severe protein-calorie malnutrition: Secondary | ICD-10-CM

## 2015-03-08 DIAGNOSIS — R45851 Suicidal ideations: Secondary | ICD-10-CM

## 2015-03-08 DIAGNOSIS — D638 Anemia in other chronic diseases classified elsewhere: Secondary | ICD-10-CM | POA: Diagnosis present

## 2015-03-08 DIAGNOSIS — F322 Major depressive disorder, single episode, severe without psychotic features: Secondary | ICD-10-CM

## 2015-03-08 LAB — CBC WITH DIFFERENTIAL/PLATELET
BASOS ABS: 0 10*3/uL (ref 0.0–0.1)
Basophils Relative: 0 % (ref 0–1)
Eosinophils Absolute: 0 10*3/uL (ref 0.0–0.7)
Eosinophils Relative: 0 % (ref 0–5)
HCT: 26.1 % — ABNORMAL LOW (ref 36.0–46.0)
Hemoglobin: 8.1 g/dL — ABNORMAL LOW (ref 12.0–15.0)
LYMPHS ABS: 0.7 10*3/uL (ref 0.7–4.0)
Lymphocytes Relative: 25 % (ref 12–46)
MCH: 26 pg (ref 26.0–34.0)
MCHC: 31 g/dL (ref 30.0–36.0)
MCV: 83.9 fL (ref 78.0–100.0)
MONO ABS: 0.1 10*3/uL (ref 0.1–1.0)
MONOS PCT: 4 % (ref 3–12)
Neutro Abs: 1.8 10*3/uL (ref 1.7–7.7)
Neutrophils Relative %: 71 % (ref 43–77)
PLATELETS: 468 10*3/uL — AB (ref 150–400)
RBC: 3.11 MIL/uL — ABNORMAL LOW (ref 3.87–5.11)
RDW: 17.3 % — AB (ref 11.5–15.5)
WBC: 2.6 10*3/uL — ABNORMAL LOW (ref 4.0–10.5)

## 2015-03-08 LAB — COMPREHENSIVE METABOLIC PANEL
ALBUMIN: 2.9 g/dL — AB (ref 3.5–5.0)
ALT: 5 U/L — AB (ref 14–54)
AST: 12 U/L — ABNORMAL LOW (ref 15–41)
Alkaline Phosphatase: 74 U/L (ref 38–126)
Anion gap: 11 (ref 5–15)
BILIRUBIN TOTAL: 0.6 mg/dL (ref 0.3–1.2)
BUN: 8 mg/dL (ref 6–20)
CHLORIDE: 102 mmol/L (ref 101–111)
CO2: 24 mmol/L (ref 22–32)
Calcium: 8.3 mg/dL — ABNORMAL LOW (ref 8.9–10.3)
Creatinine, Ser: 0.96 mg/dL (ref 0.44–1.00)
GFR calc non Af Amer: >60 mL/min (ref >60)
Glucose, Bld: 157 mg/dL — ABNORMAL HIGH (ref 65–99)
POTASSIUM: 4 mmol/L (ref 3.5–5.1)
Sodium: 137 mmol/L (ref 135–145)
Total Protein: 7.3 g/dL (ref 6.5–8.1)

## 2015-03-08 LAB — CBC
HEMATOCRIT: 25.3 % — AB (ref 36.0–46.0)
Hemoglobin: 7.9 g/dL — ABNORMAL LOW (ref 12.0–15.0)
MCH: 26.1 pg (ref 26.0–34.0)
MCHC: 31.2 g/dL (ref 30.0–36.0)
MCV: 83.5 fL (ref 78.0–100.0)
PLATELETS: 427 10*3/uL — AB (ref 150–400)
RBC: 3.03 MIL/uL — ABNORMAL LOW (ref 3.87–5.11)
RDW: 17.5 % — AB (ref 11.5–15.5)
WBC: 3.1 10*3/uL — AB (ref 4.0–10.5)

## 2015-03-08 LAB — CREATININE, SERUM
Creatinine, Ser: 0.98 mg/dL (ref 0.44–1.00)
GFR calc non Af Amer: >60 mL/min (ref >60)

## 2015-03-08 LAB — LACTIC ACID, PLASMA: Lactic Acid, Venous: 0.7 mmol/L (ref 0.5–2.0)

## 2015-03-08 LAB — MRSA PCR SCREENING: MRSA by PCR: NEGATIVE

## 2015-03-08 MED ORDER — ENSURE ENLIVE PO LIQD
237.0000 mL | Freq: Two times a day (BID) | ORAL | Status: DC
  Administered 2015-03-08 – 2015-03-11 (×5): 237 mL via ORAL

## 2015-03-08 MED ORDER — CEFTRIAXONE SODIUM 1 G IJ SOLR
1.0000 g | INTRAMUSCULAR | Status: DC
  Administered 2015-03-08 – 2015-03-10 (×3): 1 g via INTRAVENOUS
  Filled 2015-03-08 (×4): qty 10

## 2015-03-08 NOTE — Care Management Note (Signed)
Case Management Note  Patient Details  Name: Tara Brown MRN: [id number] Date of Birth: 1961-05-23  Subjective/Objective:               overdose     Action/Plan: tbd   Expected Discharge Date:         [id number]         [facility]  [facility]  In-House Referral:  Clinical Social Work  Discharge planning Services  CM Consult  Post Acute Care Choice:  NA Choice offered to:  NA  DME Arranged:    DME Agency:     HH Arranged:    HH Agency:     Status of Service:  In process, will continue to follow  Medicare Important Message Given:    Date Medicare IM Given:    Medicare IM give by:    Date Additional Medicare IM Given:    Additional Medicare Important Message give by:     If discussed at Long Length of Stay Meetings, dates discussed:    Additional Comments:  Golda Acre, RN 03/08/2015, 12:22 PM

## 2015-03-08 NOTE — Consult Note (Addendum)
[name]   Reason for Consult:  Depression and suicidal thoughts Referring Physician:  Dr Charlies Silvers Patient Identification: Tara Brown MRN:  [id number] Principal Diagnosis: Sepsis secondary to UTI, major depressive disorder, severe Diagnosis:   Patient Active Problem List   Diagnosis Date Noted  . Protein-calorie malnutrition, severe [E43] 03/08/2015  . Sepsis secondary to UTI [A41.9, N39.0] 03/08/2015  . Leukopenia [D72.819] 03/08/2015  . Suicidal ideation [R45.851] 03/08/2015  . Depression [F32.9] 03/08/2015  . Anemia of chronic disease [D63.8] 03/08/2015  . Psoriatic arthritis [L40.50] 03/07/2015    Total Time spent with patient: 30 minutes  Subjective:   Tara Brown is a 54 y.o. female patient admitted with depression, suicidal thoughts, generalized weakness and unable to do her ADLs.  She has been not eating and drinking very well.  She was found UTI.  HPI:  Patient seen chart reviewed.  Patient is 54 year old Caucasian, divorced, unemployed female who was admitted on the medical floor after she was brought into the emergency room from [facility].  She was severely depressed and having suicidal thoughts.  She has been not taking her medication, not eating well and not drinking well for past one week.  She is very concerned because of her multiple health issues.  She has severe osteoporosis and arthritis and lately she has noticed more limitation due to pain and health issues.  She lives by herself.  She does not want to be burden to her family.  She admitted lately crying spells, feeling hopeless, helpless and worthless.  Her symptoms started to get worse in recent months.  She expressed that if she cannot handle herself then she rather die.  Though she denies any hallucination or any paranoia but endorsed suicidal plan to hang herself or take overdose on her medication.  She reported that she does not want to live like this.  She has  multiple bone fracture and chronic pain.  Posterior her nephew killed in [place] after jumping from the [employer].  She has been thinking a lot about death.  She endorse poor appetite, lack of energy, crying spells, feeling hopeless, helpless and anhedonia.  She denies any mania, psychosis, hallucination or any aggressive behavior.  She lives by herself.  She has a sister who lives in [place].  She has another sister who is a traveling nurse and some time visit her.  Patient is not seeing any psychiatrist at this time.  She has been on Lexapro for more than 5 years prescribed by her primary care physician.  She is not seeing any therapist.  Patient denies drinking or using any illegal substances.   Past Medical History:  Past Medical History  Diagnosis Date  . Osteoporosis   . Psoriatic arthritis   . Anemia     Past Surgical History  Procedure Laterality Date  . Hip fracture surgery    . Knee surgery    . Foot surgery    . Hand surgery    . Neck surgery     Family History:  Family History  Problem Relation Age of Onset  . Stroke Neg Hx    Social History:  Patient lives by herself.  She married however her marriage and last only for 2 years.  She has no children.  She has a sister who lives in [place]. History  Alcohol Use No     History  Drug Use No    History   Social History  . Marital  Status: Divorced    Spouse Name: N/A  . Number of Children: N/A  . Years of Education: N/A   Social History Main Topics  . Smoking status: Never Smoker   . Smokeless tobacco: Not on file  . Alcohol Use: No  . Drug Use: No  . Sexual Activity: Not on file   Other Topics Concern  . None   Social History Narrative   Additional Social History:    patient has one psychiatric hospitalization in 2009 at [facility] when her mother died.  She did not cope well and remember being in the hospital for 1 week.  Patient denies any history of suicidal attempt in the  past.  She denies any history of drug use.  Patient denies any history of mania or psychosis.   Allergies:  No Known Allergies  Labs:  Results for orders placed or performed during the hospital encounter of 03/07/15 (from the past 48 hour(s))  CBC WITH DIFFERENTIAL     Status: Abnormal   Collection Time: 03/07/15  6:00 PM  Result Value Ref Range   WBC 4.9 4.0 - 10.5 K/uL   RBC 3.48 (L) 3.87 - 5.11 MIL/uL   Hemoglobin 9.0 (L) 12.0 - 15.0 g/dL   HCT 29.2 (L) 36.0 - 46.0 %   MCV 83.9 78.0 - 100.0 fL   MCH 25.9 (L) 26.0 - 34.0 pg   MCHC 30.8 30.0 - 36.0 g/dL   RDW 17.5 (H) 11.5 - 15.5 %   Platelets 486 (H) 150 - 400 K/uL   Neutrophils Relative % 60 43 - 77 %   Neutro Abs 2.9 1.7 - 7.7 K/uL   Lymphocytes Relative 22 12 - 46 %   Lymphs Abs 1.1 0.7 - 4.0 K/uL   Monocytes Relative 17 (H) 3 - 12 %   Monocytes Absolute 0.9 0.1 - 1.0 K/uL   Eosinophils Relative 0 0 - 5 %   Eosinophils Absolute 0.0 0.0 - 0.7 K/uL   Basophils Relative 1 0 - 1 %   Basophils Absolute 0.0 0.0 - 0.1 K/uL  Comprehensive metabolic panel     Status: Abnormal   Collection Time: 03/07/15  6:00 PM  Result Value Ref Range   Sodium 134 (L) 135 - 145 mmol/L   Potassium 3.6 3.5 - 5.1 mmol/L   Chloride 95 (L) 101 - 111 mmol/L   CO2 24 22 - 32 mmol/L   Glucose, Bld 113 (H) 65 - 99 mg/dL   BUN 9 6 - 20 mg/dL   Creatinine, Ser 1.13 (H) 0.44 - 1.00 mg/dL   Calcium 9.0 8.9 - 10.3 mg/dL   Total Protein 7.9 6.5 - 8.1 g/dL   Albumin 3.0 (L) 3.5 - 5.0 g/dL   AST 11 (L) 15 - 41 U/L   ALT 5 (L) 14 - 54 U/L   Alkaline Phosphatase 76 38 - 126 U/L   Total Bilirubin 0.8 0.3 - 1.2 mg/dL   GFR calc non Af Amer 54 (L) >60 mL/min   GFR calc Af Amer >60 >60 mL/min    Comment: (NOTE) The eGFR has been calculated using the CKD EPI equation. This calculation has not been validated in all clinical situations. eGFR's persistently <60 mL/min signify possible Chronic Kidney Disease.    Anion gap 15 5 - 15  Ethanol     Status: None    Collection Time: 03/07/15  6:00 PM  Result Value Ref Range   Alcohol, Ethyl (B) <5 <5 mg/dL  Comment:        LOWEST DETECTABLE LIMIT FOR SERUM ALCOHOL IS 5 mg/dL FOR MEDICAL PURPOSES ONLY   Acetaminophen level     Status: Abnormal   Collection Time: 03/07/15  6:00 PM  Result Value Ref Range   Acetaminophen (Tylenol), Serum <10 (L) 10 - 30 ug/mL    Comment:        THERAPEUTIC CONCENTRATIONS VARY SIGNIFICANTLY. A RANGE OF 10-30 ug/mL MAY BE AN EFFECTIVE CONCENTRATION FOR MANY PATIENTS. HOWEVER, SOME ARE BEST TREATED AT CONCENTRATIONS OUTSIDE THIS RANGE. ACETAMINOPHEN CONCENTRATIONS >150 ug/mL AT 4 HOURS AFTER INGESTION AND >50 ug/mL AT 12 HOURS AFTER INGESTION ARE OFTEN ASSOCIATED WITH TOXIC REACTIONS.   Salicylate level     Status: None   Collection Time: 03/07/15  6:00 PM  Result Value Ref Range   Salicylate Lvl <1.6 2.8 - 30.0 mg/dL  Urine rapid drug screen (hosp performed)not at [facility]     Status: None   Collection Time: 03/07/15  6:32 PM  Result Value Ref Range   Opiates NONE DETECTED NONE DETECTED   Cocaine NONE DETECTED NONE DETECTED   Benzodiazepines NONE DETECTED NONE DETECTED   Amphetamines NONE DETECTED NONE DETECTED   Tetrahydrocannabinol NONE DETECTED NONE DETECTED   Barbiturates NONE DETECTED NONE DETECTED    Comment:        DRUG SCREEN FOR MEDICAL PURPOSES ONLY.  IF CONFIRMATION IS NEEDED FOR ANY PURPOSE, NOTIFY LAB WITHIN 5 DAYS.        LOWEST DETECTABLE LIMITS FOR URINE DRUG SCREEN Drug Class       Cutoff (ng/mL) Amphetamine      1000 Barbiturate      200 Benzodiazepine   109 Tricyclics       604 Opiates          300 Cocaine          300 THC              50   Pregnancy, urine     Status: None   Collection Time: 03/07/15  6:32 PM  Result Value Ref Range   Preg Test, Ur NEGATIVE NEGATIVE    Comment:        THE SENSITIVITY OF THIS METHODOLOGY IS >20 mIU/mL.   Urinalysis, Routine w reflex microscopic (not at [facility])     Status: Abnormal    Collection Time: 03/07/15  6:32 PM  Result Value Ref Range   Color, Urine AMBER (A) YELLOW    Comment: BIOCHEMICALS MAY BE AFFECTED BY COLOR   APPearance CLOUDY (A) CLEAR   Specific Gravity, Urine 1.017 1.005 - 1.030   pH 6.0 5.0 - 8.0   Glucose, UA NEGATIVE NEGATIVE mg/dL   Hgb urine dipstick LARGE (A) NEGATIVE   Bilirubin Urine LARGE (A) NEGATIVE   Ketones, ur 15 (A) NEGATIVE mg/dL   Protein, ur 30 (A) NEGATIVE mg/dL   Urobilinogen, UA 2.0 (H) 0.0 - 1.0 mg/dL   Nitrite NEGATIVE NEGATIVE   Leukocytes, UA SMALL (A) NEGATIVE  Urine microscopic-add on     Status: Abnormal   Collection Time: 03/07/15  6:32 PM  Result Value Ref Range   Squamous Epithelial / LPF FEW (A) RARE   WBC, UA 3-6 <3 WBC/hpf   RBC / HPF 21-50 <3 RBC/hpf   Bacteria, UA MANY (A) RARE   Casts HYALINE CASTS (A) NEGATIVE    Comment: GRANULAR CAST WBC CAST   Urine culture     Status: None (Preliminary result)   Collection Time: 03/07/15  6:32 PM  Result Value Ref Range   Specimen Description URINE, CLEAN CATCH    Special Requests NONE    Culture      TOO YOUNG TO READ Performed at [facility]    Report Status PENDING   I-Stat CG4 Lactic Acid, ED     Status: None   Collection Time: 03/07/15  7:38 PM  Result Value Ref Range   Lactic Acid, Venous 0.85 0.5 - 2.0 mmol/L  Procalcitonin - Baseline     Status: None   Collection Time: 03/07/15  8:37 PM  Result Value Ref Range   Procalcitonin 0.16 ng/mL    Comment:        Interpretation: PCT (Procalcitonin) <= 0.5 ng/mL: Systemic infection (sepsis) is not likely. Local bacterial infection is possible. (NOTE)         ICU PCT Algorithm               Non ICU PCT Algorithm    ----------------------------     ------------------------------         PCT < 0.25 ng/mL                 PCT < 0.1 ng/mL     Stopping of antibiotics            Stopping of antibiotics       strongly encouraged.               strongly encouraged.    ----------------------------      ------------------------------       PCT level decrease by               PCT < 0.25 ng/mL       >= 80% from peak PCT       OR PCT 0.25 - 0.5 ng/mL          Stopping of antibiotics                                             encouraged.     Stopping of antibiotics           encouraged.    ----------------------------     ------------------------------       PCT level decrease by              PCT >= 0.25 ng/mL       < 80% from peak PCT        AND PCT >= 0.5 ng/mL            Continuin g antibiotics                                              encouraged.       Continuing antibiotics            encouraged.    ----------------------------     ------------------------------     PCT level increase compared          PCT > 0.5 ng/mL         with peak PCT AND          PCT >= 0.5 ng/mL             Escalation of antibiotics  strongly encouraged.      Escalation of antibiotics        strongly encouraged. Performed at [facility]   MRSA PCR Screening     Status: None   Collection Time: 03/07/15 10:42 PM  Result Value Ref Range   MRSA by PCR NEGATIVE NEGATIVE    Comment:        The GeneXpert MRSA Assay (FDA approved for NASAL specimens only), is one component of a comprehensive MRSA colonization surveillance program. It is not intended to diagnose MRSA infection nor to guide or monitor treatment for MRSA infections.   Lactic acid, plasma     Status: None   Collection Time: 03/08/15 12:14 AM  Result Value Ref Range   Lactic Acid, Venous 0.7 0.5 - 2.0 mmol/L  CBC     Status: Abnormal   Collection Time: 03/08/15 12:14 AM  Result Value Ref Range   WBC 3.1 (L) 4.0 - 10.5 K/uL   RBC 3.03 (L) 3.87 - 5.11 MIL/uL   Hemoglobin 7.9 (L) 12.0 - 15.0 g/dL   HCT 25.3 (L) 36.0 - 46.0 %   MCV 83.5 78.0 - 100.0 fL   MCH 26.1 26.0 - 34.0 pg   MCHC 31.2 30.0 - 36.0 g/dL   RDW 17.5 (H) 11.5 - 15.5 %   Platelets 427 (H) 150 - 400 K/uL  Creatinine,  serum     Status: None   Collection Time: 03/08/15 12:14 AM  Result Value Ref Range   Creatinine, Ser 0.98 0.44 - 1.00 mg/dL   GFR calc non Af Amer >60 >60 mL/min   GFR calc Af Amer >60 >60 mL/min    Comment: (NOTE) The eGFR has been calculated using the CKD EPI equation. This calculation has not been validated in all clinical situations. eGFR's persistently <60 mL/min signify possible Chronic Kidney Disease.   Comprehensive metabolic panel     Status: Abnormal   Collection Time: 03/08/15  3:50 AM  Result Value Ref Range   Sodium 137 135 - 145 mmol/L   Potassium 4.0 3.5 - 5.1 mmol/L   Chloride 102 101 - 111 mmol/L   CO2 24 22 - 32 mmol/L   Glucose, Bld 157 (H) 65 - 99 mg/dL   BUN 8 6 - 20 mg/dL   Creatinine, Ser 0.96 0.44 - 1.00 mg/dL   Calcium 8.3 (L) 8.9 - 10.3 mg/dL   Total Protein 7.3 6.5 - 8.1 g/dL   Albumin 2.9 (L) 3.5 - 5.0 g/dL   AST 12 (L) 15 - 41 U/L   ALT 5 (L) 14 - 54 U/L   Alkaline Phosphatase 74 38 - 126 U/L   Total Bilirubin 0.6 0.3 - 1.2 mg/dL   GFR calc non Af Amer >60 >60 mL/min   GFR calc Af Amer >60 >60 mL/min    Comment: (NOTE) The eGFR has been calculated using the CKD EPI equation. This calculation has not been validated in all clinical situations. eGFR's persistently <60 mL/min signify possible Chronic Kidney Disease.    Anion gap 11 5 - 15  CBC WITH DIFFERENTIAL     Status: Abnormal   Collection Time: 03/08/15  3:50 AM  Result Value Ref Range   WBC 2.6 (L) 4.0 - 10.5 K/uL   RBC 3.11 (L) 3.87 - 5.11 MIL/uL   Hemoglobin 8.1 (L) 12.0 - 15.0 g/dL   HCT 26.1 (L) 36.0 - 46.0 %   MCV 83.9 78.0 - 100.0 fL   MCH 26.0 26.0 - 34.0 pg  MCHC 31.0 30.0 - 36.0 g/dL   RDW 17.3 (H) 11.5 - 15.5 %   Platelets 468 (H) 150 - 400 K/uL   Neutrophils Relative % 71 43 - 77 %   Neutro Abs 1.8 1.7 - 7.7 K/uL   Lymphocytes Relative 25 12 - 46 %   Lymphs Abs 0.7 0.7 - 4.0 K/uL   Monocytes Relative 4 3 - 12 %   Monocytes Absolute 0.1 0.1 - 1.0 K/uL   Eosinophils  Relative 0 0 - 5 %   Eosinophils Absolute 0.0 0.0 - 0.7 K/uL   Basophils Relative 0 0 - 1 %   Basophils Absolute 0.0 0.0 - 0.1 K/uL    Vitals: Blood pressure 112/82, pulse 77, temperature 98.2 F (36.8 C), temperature source Oral, resp. rate 18, height 5' 6"  (1.676 m), weight 58.7 kg (129 lb 6.6 oz), SpO2 97 %.  Risk to Self: Is patient at risk for suicide?: Yes Risk to Others:   Prior Inpatient Therapy:   Prior Outpatient Therapy:    Current Facility-Administered Medications  Medication Dose Route Frequency Provider Last Rate Last Dose  . 0.9 %  sodium chloride infusion   Intravenous Continuous Rise Patience, MD 100 mL/hr at 03/08/15 [phone]    . acetaminophen (TYLENOL) tablet 650 mg  650 mg Oral Q6H PRN Rise Patience, MD       Or  . acetaminophen (TYLENOL) suppository 650 mg  650 mg Rectal Q6H PRN Rise Patience, MD      . cefTRIAXone (ROCEPHIN) 1 g in dextrose 5 % 50 mL IVPB  1 g Intravenous Q24H Rise Patience, MD      . enoxaparin (LOVENOX) injection 40 mg  40 mg Subcutaneous Q24H Rise Patience, MD   40 mg at 03/08/15 0914  . escitalopram (LEXAPRO) tablet 20 mg  20 mg Oral Daily Rise Patience, MD   20 mg at 03/08/15 0914  . feeding supplement (ENSURE ENLIVE) (ENSURE ENLIVE) liquid 237 mL  237 mL Oral BID BM Rise Patience, MD   237 mL at 03/08/15 0914  . folic acid (FOLVITE) tablet 1 mg  1 mg Oral Daily Rise Patience, MD   1 mg at 03/08/15 0914  . hydrocortisone sodium succinate (SOLU-CORTEF) 100 MG injection 50 mg  50 mg Intravenous Q8H Rise Patience, MD   50 mg at 03/08/15 0556  . ondansetron (ZOFRAN) tablet 4 mg  4 mg Oral Q6H PRN Rise Patience, MD       Or  . ondansetron [facility]) injection 4 mg  4 mg Intravenous Q6H PRN Rise Patience, MD      . oxyCODONE-acetaminophen (PERCOCET/ROXICET) 5-325 MG per tablet 1 tablet  1 tablet Oral Q4H PRN Rise Patience, MD   1 tablet at 03/08/15 1355  . predniSONE (DELTASONE)  tablet 10 mg  10 mg Oral Q breakfast Rise Patience, MD   10 mg at 03/08/15 9563  . zolpidem (AMBIEN) tablet 5 mg  5 mg Oral QHS PRN Rise Patience, MD   5 mg at 03/08/15 0044    Musculoskeletal: Strength & Muscle Tone: decreased and atrophy Gait & Station: Unable to assess as patient was lying on the bed Patient leans: N/A  Psychiatric Specialty Exam: Physical Exam  Constitutional: She is oriented to person, place, and time.  Musculoskeletal:  Joint deformity on her both hand  Neurological: She is alert and oriented to person, place, and time.  Psychiatric:  Suicidal thoughts  and plan to kill herself by hanging or taking overdose    Review of Systems  Constitutional: Positive for malaise/fatigue. Negative for weight loss.  Cardiovascular: Negative for chest pain.  Gastrointestinal: Positive for nausea.  Musculoskeletal: Positive for joint pain.  Skin: Negative for itching and rash.  Neurological: Negative for dizziness, tingling, tremors and headaches.  Psychiatric/Behavioral: Positive for depression and suicidal ideas. Negative for hallucinations and substance abuse. The patient is nervous/anxious and has insomnia.     Blood pressure 112/82, pulse 77, temperature 98.2 F (36.8 C), temperature source Oral, resp. rate 18, height 5' 6"  (1.676 m), weight 58.7 kg (129 lb 6.6 oz), SpO2 97 %.Body mass index is 20.9 kg/(m^2).  General Appearance: Tearful, tired looking  Eye Contact::  Minimal  Speech:  Slow  Volume:  Decreased  Mood:  Depressed, Dysphoric, Hopeless and Worthless  Affect:  Constricted, Depressed and Flat  Thought Process:  Intact  Orientation:  Full (Time, Place, and Person)  Thought Content:  Rumination  Suicidal Thoughts:  Yes.  with intent/plan  Homicidal Thoughts:  No  Memory:  Immediate;   Fair Recent;   Fair Remote;   Fair  Judgement:  Impaired  Insight:  Shallow  Psychomotor Activity:  Decreased  Concentration:  Fair  Recall:  [employer] of  Knowledge:Fair  Language: Fair  Akathisia:  No  Handed:  Right  AIMS (if indicated):     Assets:  Communication Skills Housing  ADL's:  Impaired  Cognition: WNL  Sleep:      Medical Decision Making: New problem, with additional work up planned, Review of Psycho-Social Stressors (1), Review or order clinical lab tests (1), Review and summation of old records (2), Established Problem, Worsening (2), Review of Medication Regimen & Side Effects (2) and Review of New Medication or Change in Dosage (2)  Treatment Plan Summary: Medication management and Plan Continue Lexapro 20 mg daily, add low-dose Remeron 15 mg at bedtime to help sleep, depression and appetite.  Plan:  Recommend psychiatric Inpatient admission when medically cleared., continue sitter for safety. Disposition: Patient will require inpatient psychiatric treatment when she is medically stable.  Ronal Maybury T. 03/08/2015 2:40 PM

## 2015-03-08 NOTE — Progress Notes (Addendum)
Patient ID: Tara Brown, female   DOB: 1961-09-07, 54 y.o.   MRN: [id number] [facility] PROGRESS NOTE  Brylinn Teaney UKG:[id number] DOB: February 20, 1961 DOA: 03/07/2015 [facility]  Brief narrative:    54 y.o. female with history of psoriatic arthritis, hypertension and chronic anemia was presented to [facility] with a depression and suicidal ideations. Per patient, she had a poor by mouth intake for past one week prior to this admission because of depression. She apparently stopped taking Lexapro because of depression. In ER, patient was found to be hypotensive, febrile and urinalysis significant for small leukocytes, negative nitrates and many bacteria. She was started on empiric Rocephin. Please note that the patient was started on stress dose hydrocortisone because she did not take prednisone for last 1 week prior to this admission and usually she takes it for psoriatic arthritis. She was admitted to stepdown unit because sepsis criteria met on the admission.  Assessment/Plan:    Principal problem: Sepsis secondary to urinary tract infection - Sepsis criteria met on the admission with fever, tachycardia, slight tachypnea, hypotension, leukopenia. Source of infection thought to be UTI. Urinalysis on admission showed leukocytes and many bacteria. Lactic acid was within normal limits. - Patient was started on empiric Rocephin. - Patient is currently hemodynamically stable and does not require pressor support. - Follow up urine culture results. - Follow blood culture results. - Patient is medically stable to be transferred out of stepdown unit to telemetry unit.  Active Problems: Depression / suicidal ideations - We resume Lexapro 20 mg daily. - Sitter at bedside. - Psych consulted  Anemia of chronic disease - Secondary to history of psoriatic arthritis - Hemoglobin stable  Psoriatic arthritis - Started on stress dose spirits because patient did  not take prednisone for about 1 week which she normally takes for psoriatic arthritis - She is also on methotrexate but this was placed on hold at the time of the admission.  Essential hypertension - Lisinopril and hold because of hypotension on the admission.  Acute renal failure - Probably from prerenal causes, dehydration versus lisinopril. - Creatinine has improved with IV fluids  Severe protein calorie malnutrition - In the context of depression - Ensure supplements ordered     DVT Prophylaxis  - Lovenox subcutaneous ordered   Code Status: Full.  Family Communication:  plan of care discussed with the patient Disposition Plan: Transfer to telemetry floor today  IV access:  Peripheral IV  Procedures and diagnostic studies:    Dg Chest 2 View 03/07/2015   Multiple bilateral rib deformities suggesting prior fractures. 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 Evidence of bone infarct versus enchondroma proximal right humerus stable.  IMPRESSION: Multiple nonacute findings     Medical Consultants:  Psychiatry  Other Consultants:  None   IAnti-Infectives:   Rocephin 03/07/2015 -->    DEVINE, ALMA, MD  [facility] Pager [phone]  Time spent in minutes: 25 minutes  If 7PM-7AM, please contact night-coverage [email or web address] Password [facility] 03/08/2015, 10:25 AM   LOS: 1 day    HPI/Subjective: No acute overnight events. Patient reports mild headache.  Objective: Filed Vitals:   03/08/15 0500 03/08/15 0600 03/08/15 0753 03/08/15 0800  BP:  128/64  135/70  Pulse: 56 59 73 67  Temp:    97.4 F (36.3 C)  TempSrc:    Oral  Resp: 15 14 19 20   Height:      Weight:      SpO2: 97% 96%  99% 98%    Intake/Output Summary (Last 24 hours) at 03/08/15 1025 Last data filed at 03/08/15 1000  Gross per 24 hour  Intake 3556.67 ml  Output      0 ml  Net 3556.67 ml    Exam:   General:  Pt is alert, follows commands appropriately, not in  acute distress  Cardiovascular: Regular rate and rhythm, S1/S2, no murmurs  Respiratory: Clear to auscultation bilaterally, no wheezing, no crackles, no rhonchi  Abdomen: Soft, non tender, non distended, bowel sounds present  Extremities: No edema, pulses DP and PT palpable bilaterally  Neuro: Grossly nonfocal  Data Reviewed: Basic Metabolic Panel:  Recent Labs Lab 03/07/15 1800 03/08/15 0014 03/08/15 0350  NA 134*  --  137  K 3.6  --  4.0  CL 95*  --  102  CO2 24  --  24  GLUCOSE 113*  --  157*  BUN 9  --  8  CREATININE 1.13* 0.98 0.96  CALCIUM 9.0  --  8.3*   Liver Function Tests:  Recent Labs Lab 03/07/15 1800 03/08/15 0350  AST 11* 12*  ALT 5* 5*  ALKPHOS 76 74  BILITOT 0.8 0.6  PROT 7.9 7.3  ALBUMIN 3.0* 2.9*   No results for input(s): LIPASE, AMYLASE in the last 168 hours. No results for input(s): AMMONIA in the last 168 hours. CBC:  Recent Labs Lab 03/07/15 1800 03/08/15 0014 03/08/15 0350  WBC 4.9 3.1* 2.6*  NEUTROABS 2.9  --  1.8  HGB 9.0* 7.9* 8.1*  HCT 29.2* 25.3* 26.1*  MCV 83.9 83.5 83.9  PLT 486* 427* 468*   Cardiac Enzymes: No results for input(s): CKTOTAL, CKMB, CKMBINDEX, TROPONINI in the last 168 hours. BNP: Invalid input(s): POCBNP CBG: No results for input(s): GLUCAP in the last 168 hours.  Recent Results (from the past 240 hour(s))  MRSA PCR Screening     Status: None   Collection Time: 03/07/15 10:42 PM  Result Value Ref Range Status   MRSA by PCR NEGATIVE NEGATIVE Final     Scheduled Meds: . cefTRIAXone   1 g Intravenous Q24H  . enoxaparin (LOVENOX)   40 mg Subcutaneous Q24H  . escitalopram  20 mg Oral Daily  . feeding supplement   237 mL Oral BID BM  . folic acid  1 mg Oral Daily  . hydrocortisone sod   50 mg Intravenous Q8H  . predniSONE  10 mg Oral Q breakfast   Continuous Infusions: . sodium chloride 100 mL/hr at 03/08/15 [phone]

## 2015-03-08 NOTE — Progress Notes (Signed)
ANTIBIOTIC CONSULT NOTE - INITIAL  Pharmacy Consult for Ceftriaxone Indication: UTI  No Known Allergies  Patient Measurements: Height: 5\' 6"  (167.6 cm) Weight: 125 lb (56.7 kg) IBW/kg (Calculated) : 59.3 Adjusted Body Weight:   Vital Signs: Temp: 98.8 F (37.1 C) (06/26 2237) Temp Source: Oral (06/26 2237) BP: 119/60 mmHg (06/26 2237) Pulse Rate: 73 (06/26 2237) Intake/Output from previous day: 06/26 0701 - 06/27 0700 In: 2050 [IV Piggyback:2050] Out: -  Intake/Output from this shift: Total I/O In: 2050 [IV Piggyback:2050] Out: -   Labs:  Recent Labs  03/07/15 1800  WBC 4.9  HGB 9.0*  PLT 486*  CREATININE 1.13*   Estimated Creatinine Clearance: 50.4 mL/min (by C-G formula based on Cr of 1.13). No results for input(s): VANCOTROUGH, VANCOPEAK, VANCORANDOM, GENTTROUGH, GENTPEAK, GENTRANDOM, TOBRATROUGH, TOBRAPEAK, TOBRARND, AMIKACINPEAK, AMIKACINTROU, AMIKACIN in the last 72 hours.   Microbiology: Recent Results (from the past 720 hour(s))  MRSA PCR Screening     Status: None   Collection Time: 03/07/15 10:42 PM  Result Value Ref Range Status   MRSA by PCR NEGATIVE NEGATIVE Final    Comment:        The GeneXpert MRSA Assay (FDA approved for NASAL specimens only), is one component of a comprehensive MRSA colonization surveillance program. It is not intended to diagnose MRSA infection nor to guide or monitor treatment for MRSA infections.     Medical History: Past Medical History  Diagnosis Date  . Osteoporosis   . Psoriatic arthritis   . Anemia     Medications:  Anti-infectives    Start     Dose/Rate Route Frequency Ordered Stop   03/08/15 1800  cefTRIAXone (ROCEPHIN) 1 g in dextrose 5 % 50 mL IVPB     1 g 100 mL/hr over 30 Minutes Intravenous Every 24 hours 03/08/15 0025     03/07/15 1945  cefTRIAXone (ROCEPHIN) 1 g in dextrose 5 % 50 mL IVPB     1 g 100 mL/hr over 30 Minutes Intravenous  Once 03/07/15 1932 03/07/15 2023   03/07/15 1937   cefTRIAXone (ROCEPHIN) 1 G injection    Comments:  03/09/15   : cabinet override      03/07/15 1937 03/07/15 1949     Assessment: Patient with UTI.  First dose of antibiotics already given.   Goal of Therapy:  Rocephin based on manufacturer dosing recommendations.  Plan: Ceftriaxone 1gm iv q24hr Follow up culture results  03/09/15 [place] 03/08/2015,12:27 AM

## 2015-03-08 NOTE — Progress Notes (Signed)
Initial Nutrition Assessment  DOCUMENTATION CODES:  Severe malnutrition in context of social or environmental circumstances  INTERVENTION: - Continue Ensure Enlive BID, each supplement provides 350 kcal and 20 grams of protein - RD will continue to monitor for needs  NUTRITION DIAGNOSIS:  Malnutrition related to social / environmental circumstances as evidenced by moderate depletions of muscle mass, moderate depletion of body fat, severe depletion of body fat, severe depletion of muscle mass.  GOAL:  Patient will meet greater than or equal to 90% of their needs  MONITOR:  PO intake, Supplement acceptance, Weight trends, Labs  REASON FOR ASSESSMENT:  Malnutrition Screening Tool  ASSESSMENT: 54 y.o. female with history of psoriatic arthritis, hypertension and chronic anemia was brought to the ER at [facility] because of depression and suicidal ideation. As per the patient patient has not been eating well over the last 1 week because of depression as patient states her general medical condition has not been improving with regard to her joints. Patient states last week she stopped taking her Lexapro due to lack of confidence and restarted taking it again 2 days ago. Patient has not been taking her other medications and has not been eating well.   Pt seen for MST. BMI indicates normal weight status. Pt reports UBW of 170 lbs and that she last weighed this at the end of 2015 (December 2015). This indicates 45 lb weight loss (26% body weight) in 7 months which is significant for time frame. Moderate to severe muscle and moderate to severe fat wasting noted.  Pt reports she ate [place] toast, eggs, and had orange juice for breakfast with 50% completion of all items. She was drinking Ensure at time of visit and indicates that she enjoys this supplement. Pt denies any abdominal pain or nausea. She states that PTA she had a poor appetite for a few weeks, then appetite returned and she  "ate everything in sight" and that for the past 1-2 weeks she has again had poor appetite.  Likely not meeting needs PTA. Medications reviewed. Labs reviewed; Ca: 8.3 mg/dL.  Height:  Ht Readings from Last 1 Encounters:  03/07/15 5\' 6"  (1.676 m)    Weight:  Wt Readings from Last 1 Encounters:  03/07/15 125 lb (56.7 kg)    Ideal Body Weight:  59.1 kg (kg)  Wt Readings from Last 10 Encounters:  03/07/15 125 lb (56.7 kg)  10/07/13 170 lb (77.111 kg)    BMI:  Body mass index is 20.19 kg/(m^2).  Estimated Nutritional Needs:  Kcal:  10/09/13  Protein:  65-75 grams  Fluid:  2.2-2.5 L/day  Skin:  Wound (see comment) (L leg abrasion)  Diet Order:  Diet Heart Room service appropriate?: Yes; Fluid consistency:: Thin  EDUCATION NEEDS:  No education needs identified at this time   Intake/Output Summary (Last 24 hours) at 03/08/15 1006 Last data filed at 03/08/15 1000  Gross per 24 hour  Intake 3556.67 ml  Output      0 ml  Net 3556.67 ml    Last BM:  PTA    03/10/15, RD, LDN Inpatient Clinical Dietitian Pager # [phone] After hours/weekend pager # [phone]

## 2015-03-08 NOTE — Progress Notes (Signed)
Date:  March 08, 2015 U.R. performed for needs and level of care. Will continue to follow for Case Management needs.  Anamari Galeas, RN, BSN, CCM   [phone] 

## 2015-03-09 DIAGNOSIS — D638 Anemia in other chronic diseases classified elsewhere: Secondary | ICD-10-CM

## 2015-03-09 DIAGNOSIS — F329 Major depressive disorder, single episode, unspecified: Secondary | ICD-10-CM

## 2015-03-09 LAB — PROCALCITONIN: PROCALCITONIN: 0.26 ng/mL

## 2015-03-09 MED ORDER — CIPROFLOXACIN HCL 500 MG PO TABS: 500.0000 mg | ORAL_TABLET | Freq: Two times a day (BID) | ORAL | Status: DC

## 2015-03-09 NOTE — Discharge Summary (Addendum)
Physician Discharge Summary  Tara Brown QTM:[id number] DOB: 02-26-1961 DOA: 03/07/2015  PCP: [facility]   Admit date: 03/07/2015 Discharge date: 03/09/2015  Recommendations for Outpatient Follow-up:  1. Pt will continue cipro for 5 more days on discharge for UTI  Discharge Diagnoses:  Principal Problem:   Sepsis secondary to UTI Active Problems:   Psoriatic arthritis   Protein-calorie malnutrition, severe   Leukopenia   Suicidal ideation   Depression   Anemia of chronic disease   MDD (major depressive disorder), recurrent episode, severe    Discharge Condition: stable   Diet recommendation: as tolerated   History of present illness:  54 y.o. female with history of psoriatic arthritis, hypertension and chronic anemia was presented to [facility] with a depression and suicidal ideations. Per patient, she had a poor by mouth intake for past one week prior to this admission because of depression. She apparently stopped taking Lexapro because of depression.  In ER, patient was found to be hypotensive, febrile and urinalysis significant for small leukocytes, negative nitrates and many bacteria. She was started on empiric Rocephin. Please note that the patient was started on stress dose hydrocortisone because she did not take prednisone for last 1 week prior to this admission and usually she takes it for psoriatic arthritis.  She was admitted to stepdown unit because sepsis criteria met on the admission. She was transferred out of SDU 03/08/2015.  Hospital Course:   Assessment/Plan:    Principal problem: Sepsis secondary to urinary tract infection / Leukopenia - Sepsis criteria met on the admission with fever, tachycardia, slight tachypnea, hypotension, leukopenia. Source of infection UTI since urinalysis on admission showed leukocytes and many bacteria. Lactic acid was within normal limits. Procalcitonin was mildly elevated at 0.16. - Urine culture  result still no available - Will place on cipro for 5 days on discharge. - She already completed 2 days of IV Rocephin  - Blood cultures obtained on the admission are in process at this time. Will follow up on final results.  - Pt is stable for [facility] placement/discharge today.   Active Problems: Depression / suicidal ideations - Continue Lexapro 20 mg daily. - Sitter at bedside. - Per Psych pt to go to [facility]  Anemia of chronic disease - Secondary to history of psoriatic arthritis - Stable  Psoriatic arthritis - Started on stress dose spirits because patient did not take prednisone for about 1 week which she normally takes for psoriatic arthritis - On discharge she will resume her regular prednisone dose - May resume once a week MTX  Essential hypertension - Lisinopril and hold because of hypotension on the admission. - May resume lisinopril on discharge since BP improved, 140/85  Acute renal failure - Probably from prerenal causes, dehydration versus lisinopril. - Creatinine has normalized with IV fluids  Severe protein calorie malnutrition - In the context of depression - Continue ensure supplements    DVT Prophylaxis  - Lovenox subcutaneous ordered while pt in hospital    Code Status: Full.  Family Communication: plan of care discussed with the patient   IV access:  Peripheral IV  Procedures and diagnostic studies:   Dg Chest 2 View 03/07/2015 Multiple bilateral rib deformities suggesting prior fractures. 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 Evidence of bone infarct versus enchondroma proximal right humerus stable. IMPRESSION: Multiple nonacute findings   Medical Consultants:  Psychiatry  Other Consultants:  None   IAnti-Infectives:   Rocephin 03/07/2015 --> 03/09/2015 Cipro for 5  days on discharge     Signed:  Leisa Lenz, MD  [facility] 03/09/2015, 9:59 AM  Pager #: [phone]  Time spent  in minutes: more than 30 minutes   Discharge Exam: Filed Vitals:   03/09/15 0621  BP: 140/85  Pulse: 69  Temp: 98 F (36.7 C)  Resp: 16   Filed Vitals:   03/08/15 1209 03/08/15 1458 03/08/15 2054 03/09/15 0621  BP: 112/82 110/70 134/57 140/85  Pulse: 77 69 66 69  Temp: 98.2 F (36.8 C) 97.9 F (36.6 C) 98 F (36.7 C) 98 F (36.7 C)  TempSrc: Oral Oral Oral Oral  Resp: 18 18 18 16   Height: 5' 6"  (1.676 m)     Weight: 58.7 kg (129 lb 6.6 oz)     SpO2: 97% 98% 97% 98%    General: Pt is alert, follows commands appropriately, not in acute distress Cardiovascular: Regular rate and rhythm, S1/S2 +, no murmurs Respiratory: Clear to auscultation bilaterally, no wheezing, no crackles, no rhonchi Abdominal: Soft, non tender, non distended, bowel sounds +, no guarding Extremities: no edema, no cyanosis, pulses palpable bilaterally DP and PT Neuro: Grossly nonfocal  Discharge Instructions  Discharge Instructions    Call MD for:  difficulty breathing, headache or visual disturbances    Complete by:  As directed      Call MD for:  persistant nausea and vomiting    Complete by:  As directed      Call MD for:  severe uncontrolled pain    Complete by:  As directed      Diet - low sodium heart healthy    Complete by:  As directed      Discharge instructions    Complete by:  As directed   1. Continue ciprofloxacin on discharge for 5 days for UTI     Increase activity slowly    Complete by:  As directed             Medication List    STOP taking these medications        oxyCODONE-acetaminophen 5-325 MG per tablet  Commonly known as:  PERCOCET/ROXICET     traMADol 50 MG tablet  Commonly known as:  ULTRAM     TRAMADOL HCL PO     zolpidem 5 MG tablet  Commonly known as:  AMBIEN      TAKE these medications        ciprofloxacin 500 MG tablet  Commonly known as:  CIPRO  Take 1 tablet (500 mg total) by mouth 2 (two) times daily.     ENBREL [place]  Inject into the skin.      escitalopram 20 MG tablet  Commonly known as:  LEXAPRO  Take 20 mg by mouth daily.     folic acid 1 MG tablet  Commonly known as:  FOLVITE  Take 1 mg by mouth daily.     lisinopril 40 MG tablet  Commonly known as:  PRINIVIL,ZESTRIL  Take 40 mg by mouth daily.     methotrexate 2.5 MG tablet  Commonly known as:  RHEUMATREX  Take 10 mg by mouth once a week. Caution:Chemotherapy. Protect from light.     predniSONE 10 MG tablet  Commonly known as:  DELTASONE  Take 10 mg by mouth daily with breakfast.     PRILOSEC PO  Take by mouth.           Follow-up Information    Follow up with REESE,BETTI D, MD. Schedule an appointment as  soon as possible for a visit in 2 weeks.   Specialty:  Family Medicine   Why:  Follow up appt after recent hospitalization   Contact information:   [address] [address] [phone]        The results of significant diagnostics from this hospitalization (including imaging, microbiology, ancillary and laboratory) are listed below for reference.    Significant Diagnostic Studies: Dg Chest 2 View  03/07/2015   CLINICAL DATA:  Decreased appetite, depression common 20 lb weight loss in 6 weeks  EXAM: CHEST  2 VIEW  COMPARISON:  Shoulder radiograph 12/23/2014  FINDINGS: Mild cardiac enlargement. Moderate to severe sigmoid scoliosis thoracolumbar spine. Vascular pattern normal. Lungs clear. Multiple bilateral rib deformities suggesting prior fractures. 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 Evidence of bone infarct versus enchondroma proximal right humerus stable.  IMPRESSION: Multiple nonacute findings   Electronically Signed   By: Skipper Cliche M.D.   On: 03/07/2015 21:15    Microbiology: Recent Results (from the past 240 hour(s))  Urine culture     Status: None (Preliminary result)   Collection Time: 03/07/15  6:32 PM  Result Value Ref Range Status   Specimen Description URINE, CLEAN  CATCH  Final   Special Requests NONE  Final   Culture   Final    TOO YOUNG TO READ Performed at [facility]    Report Status PENDING  Incomplete  MRSA PCR Screening     Status: None   Collection Time: 03/07/15 10:42 PM  Result Value Ref Range Status   MRSA by PCR NEGATIVE NEGATIVE Final    Comment:        The GeneXpert MRSA Assay (FDA approved for NASAL specimens only), is one component of a comprehensive MRSA colonization surveillance program. It is not intended to diagnose MRSA infection nor to guide or monitor treatment for MRSA infections.      Labs: Basic Metabolic Panel:  Recent Labs Lab 03/07/15 1800 03/08/15 0014 03/08/15 0350  NA 134*  --  137  K 3.6  --  4.0  CL 95*  --  102  CO2 24  --  24  GLUCOSE 113*  --  157*  BUN 9  --  8  CREATININE 1.13* 0.98 0.96  CALCIUM 9.0  --  8.3*   Liver Function Tests:  Recent Labs Lab 03/07/15 1800 03/08/15 0350  AST 11* 12*  ALT 5* 5*  ALKPHOS 76 74  BILITOT 0.8 0.6  PROT 7.9 7.3  ALBUMIN 3.0* 2.9*   No results for input(s): LIPASE, AMYLASE in the last 168 hours. No results for input(s): AMMONIA in the last 168 hours. CBC:  Recent Labs Lab 03/07/15 1800 03/08/15 0014 03/08/15 0350  WBC 4.9 3.1* 2.6*  NEUTROABS 2.9  --  1.8  HGB 9.0* 7.9* 8.1*  HCT 29.2* 25.3* 26.1*  MCV 83.9 83.5 83.9  PLT 486* 427* 468*   Cardiac Enzymes: No results for input(s): CKTOTAL, CKMB, CKMBINDEX, TROPONINI in the last 168 hours. BNP: BNP (last 3 results) No results for input(s): BNP in the last 8760 hours.  ProBNP (last 3 results) No results for input(s): PROBNP in the last 8760 hours.  CBG: No results for input(s): GLUCAP in the last 168 hours.

## 2015-03-09 NOTE — Care Management (Signed)
Important Message  Patient Details  Name: Tara Brown MRN: [id number] Date of Birth: Jun 06, 1961   Medicare Important Message Given:       Haskell Flirt, [place] 03/09/2015, 3:31 PM

## 2015-03-09 NOTE — Consult Note (Signed)
Follow Up Psychiatry Note    Patient Identification: Tara Brown MRN:  [id number] Principal Diagnosis: Sepsis secondary to UTI, major depressive disorder, severe Diagnosis:   Patient Active Problem List   Diagnosis Date Noted  . Protein-calorie malnutrition, severe [E43] 03/08/2015  . Sepsis secondary to UTI [A41.9, N39.0] 03/08/2015  . Leukopenia [D72.819] 03/08/2015  . Suicidal ideation [R45.851] 03/08/2015  . Depression [F32.9] 03/08/2015  . Anemia of chronic disease [D63.8] 03/08/2015  . MDD (major depressive disorder), recurrent episode, severe [F33.2] 03/08/2015  . Psoriatic arthritis [L40.50] 03/07/2015    Total Time spent with patient: 20 minutes  Subjective:   Tara Brown is a 54 y.o. female patient admitted with depression, suicidal thoughts, generalized weakness and unable to do her ADLs.  She has been not eating and drinking very well.  She was found UTI.  HPI:  Patient seen chart reviewed.  Patient is 54 year old Caucasian, divorced, unemployed female who was admitted on the medical floor after she was brought into the emergency room from [facility].  She was severely depressed and having suicidal thoughts.  She has been not taking her medication, not eating well and not drinking well for past one week.  She is very concerned because of her multiple health issues.  She has severe osteoporosis and arthritis and lately she has noticed more limitation due to pain and health issues.  She lives by herself.  She does not want to be burden to her family.  She admitted lately crying spells, feeling hopeless, helpless and worthless.  Her symptoms started to get worse in recent months.  She expressed that if she cannot handle herself then she rather die.  Though she denies any hallucination or any paranoia but endorsed suicidal plan to hang herself or take overdose on her medication.  She reported that she does not want to live like this.  She has multiple bone  fracture and chronic pain.  Posterior her nephew killed in [place] after jumping from the [place].  She has been thinking a lot about death.  She endorse poor appetite, lack of energy, crying spells, feeling hopeless, helpless and anhedonia.  She denies any mania, psychosis, hallucination or any aggressive behavior.  She lives by herself.  She has a sister who lives in [place].  She has another sister who is a traveling nurse and some time visit her.  Patient is not seeing any psychiatrist at this time.  She has been on Lexapro for more than 5 years prescribed by her primary care physician.  She is not seeing any therapist.  Patient denies drinking or using any illegal substances.   Has the patient been a risk to self in the past 6 months?  Yes.   Has the patient been a risk to self within the distant past?  No. Is the patient a risk to others?  No. Has the patient been a risk to others in the past 6 months?  No. Has the patient been a risk to others within the distant past?  No.  Interval History; (03/09/15) Patient seen chart reviewed.  Patient remains very depressed sad and continued to endorse suicidal thoughts and plan to take overdose or hang herself when she leaves from the hospital.  She admitted racing thoughts and poor sleep.  She feels hopeless, helpless and worthless.  She is concerned about her health issues.  She concerned about her ADLs.  She denies any paranoia or any hallucination but admitted poor self-esteem, anhedonia.  She cannot contract for safety.  She is taking Lexapro and denies any side effects.  She has nausea.  We recommended to add low-dose Remeron if not medically contraindicated however it is not started yet.  There has been no aggressive behavior.  Past Medical History:  Past Medical History  Diagnosis Date  . Osteoporosis   . Psoriatic arthritis   . Anemia     Past Surgical History  Procedure Laterality Date  . Hip fracture surgery    . Knee  surgery    . Foot surgery    . Hand surgery    . Neck surgery     Family History:  Family History  Problem Relation Age of Onset  . Stroke Neg Hx    Social History:  Patient lives by herself.  She married however her marriage and last only for 2 years.  She has no children.  She has a sister who lives in [place]. History  Alcohol Use No     History  Drug Use No    History   Social History  . Marital Status: Divorced    Spouse Name: N/A  . Number of Children: N/A  . Years of Education: N/A   Social History Main Topics  . Smoking status: Never Smoker   . Smokeless tobacco: Not on file  . Alcohol Use: No  . Drug Use: No  . Sexual Activity: Not on file   Other Topics Concern  . None   Social History Narrative   Additional Social History:    patient has one psychiatric hospitalization in 2009 at [place] when her mother died.  She did not cope well and remember being in the hospital for 1 week.  Patient denies any history of suicidal attempt in the past.  She denies any history of drug use.  Patient denies any history of mania or psychosis.   Allergies:  No Known Allergies  Labs:  Results for orders placed or performed during the hospital encounter of 03/07/15 (from the past 48 hour(s))  CBC WITH DIFFERENTIAL     Status: Abnormal   Collection Time: 03/07/15  6:00 PM  Result Value Ref Range   WBC 4.9 4.0 - 10.5 K/uL   RBC 3.48 (L) 3.87 - 5.11 MIL/uL   Hemoglobin 9.0 (L) 12.0 - 15.0 g/dL   HCT 29.2 (L) 36.0 - 46.0 %   MCV 83.9 78.0 - 100.0 fL   MCH 25.9 (L) 26.0 - 34.0 pg   MCHC 30.8 30.0 - 36.0 g/dL   RDW 17.5 (H) 11.5 - 15.5 %   Platelets 486 (H) 150 - 400 K/uL   Neutrophils Relative % 60 43 - 77 %   Neutro Abs 2.9 1.7 - 7.7 K/uL   Lymphocytes Relative 22 12 - 46 %   Lymphs Abs 1.1 0.7 - 4.0 K/uL   Monocytes Relative 17 (H) 3 - 12 %   Monocytes Absolute 0.9 0.1 - 1.0 K/uL   Eosinophils Relative 0 0 - 5 %   Eosinophils Absolute 0.0 0.0  - 0.7 K/uL   Basophils Relative 1 0 - 1 %   Basophils Absolute 0.0 0.0 - 0.1 K/uL  Comprehensive metabolic panel     Status: Abnormal   Collection Time: 03/07/15  6:00 PM  Result Value Ref Range   Sodium 134 (L) 135 - 145 mmol/L   Potassium 3.6 3.5 - 5.1 mmol/L   Chloride 95 (L) 101 - 111 mmol/L   CO2 24 22 -  32 mmol/L   Glucose, Bld 113 (H) 65 - 99 mg/dL   BUN 9 6 - 20 mg/dL   Creatinine, Ser 1.13 (H) 0.44 - 1.00 mg/dL   Calcium 9.0 8.9 - 10.3 mg/dL   Total Protein 7.9 6.5 - 8.1 g/dL   Albumin 3.0 (L) 3.5 - 5.0 g/dL   AST 11 (L) 15 - 41 U/L   ALT 5 (L) 14 - 54 U/L   Alkaline Phosphatase 76 38 - 126 U/L   Total Bilirubin 0.8 0.3 - 1.2 mg/dL   GFR calc non Af Amer 54 (L) >60 mL/min   GFR calc Af Amer >60 >60 mL/min    Comment: (NOTE) The eGFR has been calculated using the CKD EPI equation. This calculation has not been validated in all clinical situations. eGFR's persistently <60 mL/min signify possible Chronic Kidney Disease.    Anion gap 15 5 - 15  Ethanol     Status: None   Collection Time: 03/07/15  6:00 PM  Result Value Ref Range   Alcohol, Ethyl (B) <5 <5 mg/dL    Comment:        LOWEST DETECTABLE LIMIT FOR SERUM ALCOHOL IS 5 mg/dL FOR MEDICAL PURPOSES ONLY   Acetaminophen level     Status: Abnormal   Collection Time: 03/07/15  6:00 PM  Result Value Ref Range   Acetaminophen (Tylenol), Serum <10 (L) 10 - 30 ug/mL    Comment:        THERAPEUTIC CONCENTRATIONS VARY SIGNIFICANTLY. A RANGE OF 10-30 ug/mL MAY BE AN EFFECTIVE CONCENTRATION FOR MANY PATIENTS. HOWEVER, SOME ARE BEST TREATED AT CONCENTRATIONS OUTSIDE THIS RANGE. ACETAMINOPHEN CONCENTRATIONS >150 ug/mL AT 4 HOURS AFTER INGESTION AND >50 ug/mL AT 12 HOURS AFTER INGESTION ARE OFTEN ASSOCIATED WITH TOXIC REACTIONS.   Salicylate level     Status: None   Collection Time: 03/07/15  6:00 PM  Result Value Ref Range   Salicylate Lvl <9.5 2.8 - 30.0 mg/dL  Urine rapid drug screen (hosp performed)not at  [facility]     Status: None   Collection Time: 03/07/15  6:32 PM  Result Value Ref Range   Opiates NONE DETECTED NONE DETECTED   Cocaine NONE DETECTED NONE DETECTED   Benzodiazepines NONE DETECTED NONE DETECTED   Amphetamines NONE DETECTED NONE DETECTED   Tetrahydrocannabinol NONE DETECTED NONE DETECTED   Barbiturates NONE DETECTED NONE DETECTED    Comment:        DRUG SCREEN FOR MEDICAL PURPOSES ONLY.  IF CONFIRMATION IS NEEDED FOR ANY PURPOSE, NOTIFY LAB WITHIN 5 DAYS.        LOWEST DETECTABLE LIMITS FOR URINE DRUG SCREEN Drug Class       Cutoff (ng/mL) Amphetamine      1000 Barbiturate      200 Benzodiazepine   638 Tricyclics       756 Opiates          300 Cocaine          300 THC              50   Pregnancy, urine     Status: None   Collection Time: 03/07/15  6:32 PM  Result Value Ref Range   Preg Test, Ur NEGATIVE NEGATIVE    Comment:        THE SENSITIVITY OF THIS METHODOLOGY IS >20 mIU/mL.   Urinalysis, Routine w reflex microscopic (not at [facility])     Status: Abnormal   Collection Time: 03/07/15  6:32 PM  Result Value Ref  Range   Color, Urine AMBER (A) YELLOW    Comment: BIOCHEMICALS MAY BE AFFECTED BY COLOR   APPearance CLOUDY (A) CLEAR   Specific Gravity, Urine 1.017 1.005 - 1.030   pH 6.0 5.0 - 8.0   Glucose, UA NEGATIVE NEGATIVE mg/dL   Hgb urine dipstick LARGE (A) NEGATIVE   Bilirubin Urine LARGE (A) NEGATIVE   Ketones, ur 15 (A) NEGATIVE mg/dL   Protein, ur 30 (A) NEGATIVE mg/dL   Urobilinogen, UA 2.0 (H) 0.0 - 1.0 mg/dL   Nitrite NEGATIVE NEGATIVE   Leukocytes, UA SMALL (A) NEGATIVE  Urine microscopic-add on     Status: Abnormal   Collection Time: 03/07/15  6:32 PM  Result Value Ref Range   Squamous Epithelial / LPF FEW (A) RARE   WBC, UA 3-6 <3 WBC/hpf   RBC / HPF 21-50 <3 RBC/hpf   Bacteria, UA MANY (A) RARE   Casts HYALINE CASTS (A) NEGATIVE    Comment: GRANULAR CAST WBC CAST   Urine culture     Status: None (Preliminary result)   Collection  Time: 03/07/15  6:32 PM  Result Value Ref Range   Specimen Description URINE, CLEAN CATCH    Special Requests NONE    Culture      TOO YOUNG TO READ Performed at [facility]    Report Status PENDING   I-Stat CG4 Lactic Acid, ED     Status: None   Collection Time: 03/07/15  7:38 PM  Result Value Ref Range   Lactic Acid, Venous 0.85 0.5 - 2.0 mmol/L  Procalcitonin - Baseline     Status: None   Collection Time: 03/07/15  8:37 PM  Result Value Ref Range   Procalcitonin 0.16 ng/mL    Comment:        Interpretation: PCT (Procalcitonin) <= 0.5 ng/mL: Systemic infection (sepsis) is not likely. Local bacterial infection is possible. (NOTE)         ICU PCT Algorithm               Non ICU PCT Algorithm    ----------------------------     ------------------------------         PCT < 0.25 ng/mL                 PCT < 0.1 ng/mL     Stopping of antibiotics            Stopping of antibiotics       strongly encouraged.               strongly encouraged.    ----------------------------     ------------------------------       PCT level decrease by               PCT < 0.25 ng/mL       >= 80% from peak PCT       OR PCT 0.25 - 0.5 ng/mL          Stopping of antibiotics                                             encouraged.     Stopping of antibiotics           encouraged.    ----------------------------     ------------------------------       PCT level decrease by  PCT >= 0.25 ng/mL       < 80% from peak PCT        AND PCT >= 0.5 ng/mL            Continuin g antibiotics                                              encouraged.       Continuing antibiotics            encouraged.    ----------------------------     ------------------------------     PCT level increase compared          PCT > 0.5 ng/mL         with peak PCT AND          PCT >= 0.5 ng/mL             Escalation of antibiotics                                          strongly encouraged.      Escalation of  antibiotics        strongly encouraged. Performed at [facility]   MRSA PCR Screening     Status: None   Collection Time: 03/07/15 10:42 PM  Result Value Ref Range   MRSA by PCR NEGATIVE NEGATIVE    Comment:        The GeneXpert MRSA Assay (FDA approved for NASAL specimens only), is one component of a comprehensive MRSA colonization surveillance program. It is not intended to diagnose MRSA infection nor to guide or monitor treatment for MRSA infections.   Lactic acid, plasma     Status: None   Collection Time: 03/08/15 12:14 AM  Result Value Ref Range   Lactic Acid, Venous 0.7 0.5 - 2.0 mmol/L  CBC     Status: Abnormal   Collection Time: 03/08/15 12:14 AM  Result Value Ref Range   WBC 3.1 (L) 4.0 - 10.5 K/uL   RBC 3.03 (L) 3.87 - 5.11 MIL/uL   Hemoglobin 7.9 (L) 12.0 - 15.0 g/dL   HCT 25.3 (L) 36.0 - 46.0 %   MCV 83.5 78.0 - 100.0 fL   MCH 26.1 26.0 - 34.0 pg   MCHC 31.2 30.0 - 36.0 g/dL   RDW 17.5 (H) 11.5 - 15.5 %   Platelets 427 (H) 150 - 400 K/uL  Creatinine, serum     Status: None   Collection Time: 03/08/15 12:14 AM  Result Value Ref Range   Creatinine, Ser 0.98 0.44 - 1.00 mg/dL   GFR calc non Af Amer >60 >60 mL/min   GFR calc Af Amer >60 >60 mL/min    Comment: (NOTE) The eGFR has been calculated using the CKD EPI equation. This calculation has not been validated in all clinical situations. eGFR's persistently <60 mL/min signify possible Chronic Kidney Disease.   Comprehensive metabolic panel     Status: Abnormal   Collection Time: 03/08/15  3:50 AM  Result Value Ref Range   Sodium 137 135 - 145 mmol/L   Potassium 4.0 3.5 - 5.1 mmol/L   Chloride 102 101 - 111 mmol/L   CO2 24 22 - 32 mmol/L   Glucose, Bld 157 (H) 65 - 99  mg/dL   BUN 8 6 - 20 mg/dL   Creatinine, Ser 0.96 0.44 - 1.00 mg/dL   Calcium 8.3 (L) 8.9 - 10.3 mg/dL   Total Protein 7.3 6.5 - 8.1 g/dL   Albumin 2.9 (L) 3.5 - 5.0 g/dL   AST 12 (L) 15 - 41 U/L   ALT 5 (L) 14 - 54 U/L    Alkaline Phosphatase 74 38 - 126 U/L   Total Bilirubin 0.6 0.3 - 1.2 mg/dL   GFR calc non Af Amer >60 >60 mL/min   GFR calc Af Amer >60 >60 mL/min    Comment: (NOTE) The eGFR has been calculated using the CKD EPI equation. This calculation has not been validated in all clinical situations. eGFR's persistently <60 mL/min signify possible Chronic Kidney Disease.    Anion gap 11 5 - 15  CBC WITH DIFFERENTIAL     Status: Abnormal   Collection Time: 03/08/15  3:50 AM  Result Value Ref Range   WBC 2.6 (L) 4.0 - 10.5 K/uL   RBC 3.11 (L) 3.87 - 5.11 MIL/uL   Hemoglobin 8.1 (L) 12.0 - 15.0 g/dL   HCT 26.1 (L) 36.0 - 46.0 %   MCV 83.9 78.0 - 100.0 fL   MCH 26.0 26.0 - 34.0 pg   MCHC 31.0 30.0 - 36.0 g/dL   RDW 17.3 (H) 11.5 - 15.5 %   Platelets 468 (H) 150 - 400 K/uL   Neutrophils Relative % 71 43 - 77 %   Neutro Abs 1.8 1.7 - 7.7 K/uL   Lymphocytes Relative 25 12 - 46 %   Lymphs Abs 0.7 0.7 - 4.0 K/uL   Monocytes Relative 4 3 - 12 %   Monocytes Absolute 0.1 0.1 - 1.0 K/uL   Eosinophils Relative 0 0 - 5 %   Eosinophils Absolute 0.0 0.0 - 0.7 K/uL   Basophils Relative 0 0 - 1 %   Basophils Absolute 0.0 0.0 - 0.1 K/uL  Procalcitonin     Status: None   Collection Time: 03/09/15  4:34 AM  Result Value Ref Range   Procalcitonin 0.26 ng/mL    Comment:        Interpretation: PCT (Procalcitonin) <= 0.5 ng/mL: Systemic infection (sepsis) is not likely. Local bacterial infection is possible. (NOTE)         ICU PCT Algorithm               Non ICU PCT Algorithm    ----------------------------     ------------------------------         PCT < 0.25 ng/mL                 PCT < 0.1 ng/mL     Stopping of antibiotics            Stopping of antibiotics       strongly encouraged.               strongly encouraged.    ----------------------------     ------------------------------       PCT level decrease by               PCT < 0.25 ng/mL       >= 80% from peak PCT       OR PCT 0.25 - 0.5 ng/mL           Stopping of antibiotics  encouraged.     Stopping of antibiotics           encouraged.    ----------------------------     ------------------------------       PCT level decrease by              PCT >= 0.25 ng/mL       < 80% from peak PCT        AND PCT >= 0.5 ng/mL            Continuin g antibiotics                                              encouraged.       Continuing antibiotics            encouraged.    ----------------------------     ------------------------------     PCT level increase compared          PCT > 0.5 ng/mL         with peak PCT AND          PCT >= 0.5 ng/mL             Escalation of antibiotics                                          strongly encouraged.      Escalation of antibiotics        strongly encouraged.     Vitals: Blood pressure 140/85, pulse 69, temperature 98 F (36.7 C), temperature source Oral, resp. rate 16, height _0  (1.676 m), weight 58.7 kg (129 lb 6.6 oz), SpO2 98 %.  Risk to Self: Is patient at risk for suicide?: Yes Risk to Others:   Prior Inpatient Therapy:   Prior Outpatient Therapy:    Current Facility-Administered Medications  Medication Dose Route Frequency Provider Last Rate Last Dose  . acetaminophen (TYLENOL) tablet 650 mg  650 mg Oral Q6H PRN Rise Patience, MD   650 mg at 03/08/15 2003   Or  . acetaminophen (TYLENOL) suppository 650 mg  650 mg Rectal Q6H PRN Rise Patience, MD      . cefTRIAXone (ROCEPHIN) 1 g in dextrose 5 % 50 mL IVPB  1 g Intravenous Q24H Rise Patience, MD   1 g at 03/08/15 1714  . enoxaparin (LOVENOX) injection 40 mg  40 mg Subcutaneous Q24H Rise Patience, MD   40 mg at 03/09/15 0944  . escitalopram (LEXAPRO) tablet 20 mg  20 mg Oral Daily Rise Patience, MD   20 mg at 03/09/15 0944  . feeding supplement (ENSURE ENLIVE) (ENSURE ENLIVE) liquid 237 mL  237 mL Oral BID BM Rise Patience, MD   237 mL at 03/08/15 0914  .  folic acid (FOLVITE) tablet 1 mg  1 mg Oral Daily Rise Patience, MD   1 mg at 03/09/15 0944  . ondansetron (ZOFRAN) tablet 4 mg  4 mg Oral Q6H PRN Rise Patience, MD       Or  . ondansetron [facility]) injection 4 mg  4 mg Intravenous Q6H PRN Rise Patience, MD      . oxyCODONE-acetaminophen (PERCOCET/ROXICET) 5-325 MG per tablet 1 tablet  1 tablet Oral Q4H  PRN Rise Patience, MD   1 tablet at 03/09/15 [phone]  . predniSONE (DELTASONE) tablet 10 mg  10 mg Oral Q breakfast Rise Patience, MD   10 mg at 03/09/15 0944  . zolpidem (AMBIEN) tablet 5 mg  5 mg Oral QHS PRN Rise Patience, MD   5 mg at 03/08/15 2157    Musculoskeletal: Strength & Muscle Tone: decreased and atrophy Gait & Station: Unable to assess as patient was lying on the bed Patient leans: N/A  Psychiatric Specialty Exam: Physical Exam  Constitutional: She is oriented to person, place, and time.  Musculoskeletal:  Joint deformity on her both hand  Neurological: She is alert and oriented to person, place, and time.  Psychiatric:  Suicidal thoughts and plan to kill herself by hanging or taking overdose    Review of Systems  Constitutional: Positive for malaise/fatigue. Negative for weight loss.  Cardiovascular: Negative for chest pain and palpitations.  Gastrointestinal: Positive for nausea.  Musculoskeletal: Positive for joint pain.  Neurological: Negative for dizziness, tingling and tremors.  Psychiatric/Behavioral: Positive for depression and suicidal ideas. Negative for hallucinations and substance abuse. The patient has insomnia.     Blood pressure 140/85, pulse 69, temperature 98 F (36.7 C), temperature source Oral, resp. rate 16, height _0  (1.676 m), weight 58.7 kg (129 lb 6.6 oz), SpO2 98 %.Body mass index is 20.9 kg/(m^2).  General Appearance: Tearful  Eye Contact::  Minimal  Speech:  Slow  Volume:  Decreased  Mood:  Depressed, Dysphoric, Hopeless and Worthless  Affect:   Constricted, Depressed and Flat  Thought Process:  Intact  Orientation:  Full (Time, Place, and Person)  Thought Content:  Rumination  Suicidal Thoughts:  Yes.  with intent/plan  Homicidal Thoughts:  No  Memory:  Immediate;   Fair Recent;   Fair Remote;   Fair  Judgement:  Impaired  Insight:  Shallow  Psychomotor Activity:  Decreased  Concentration:  Fair  Recall:  [employer] of Knowledge:Fair  Language: Fair  Akathisia:  No  Handed:  Right  AIMS (if indicated):     Assets:  Communication Skills Housing  ADL's:  Impaired  Cognition: WNL  Sleep:      Medical Decision Making: Review of Psycho-Social Stressors (1), Review or order clinical lab tests (1), Established Problem, Worsening (2), Review of Last Therapy Session (1), Review of Medication Regimen & Side Effects (2) and Review of New Medication or Change in Dosage (2)  Treatment Plan Summary: Medication management and Plan Continue Lexapro 20 mg daily, start Remeron 15 mg at bedtime if not medically contraindicated to help sleep, depression and appetite.  Plan:  Recommend psychiatric Inpatient admission when medically cleared., continue sitter for safety. Disposition: Patient is waiting transfer to inpatient psychiatric facility for further treatment and stabilization.    Tara Brown. 03/09/2015 10:23 AM

## 2015-03-09 NOTE — Progress Notes (Addendum)
Clinical Social Work  Per MD, patient medically stable to DC. CSW contacted the following facilities in search for bed availability.  [facility]- admissions is reviewing and will contact CSW if they can accept. (Addendum 1600: Per admissions no available beds)  [facility]) reviewing and will contact CSW if they can accept. (Addendum 1600: Per [facility]) no available beds)  [facility]- no available beds  [facility]- message left with admissions  [facility]- admissions unsure about bed availability. Referral faxed. (Addendum 1600: left message with admissions)  [facility]- available beds. Referral faxed. (Addendum 1600: admissions reports referral was misplaced referral. CSW re-faxed)  CSW will continue to follow.  [place], [place] [phone]

## 2015-03-09 NOTE — Discharge Instructions (Signed)

## 2015-03-10 LAB — URINE CULTURE

## 2015-03-10 NOTE — Progress Notes (Signed)
Please refer to discharge summary completed 03/09/2015. No changes in medical management since 03/09/2015. Appreciate social work assistance with discharge plan.  Manson Passey [facility] [phone]

## 2015-03-10 NOTE — Progress Notes (Signed)
Patient states Percocet decreased pain moderately. Offered lavender eo for aromatherapy. Patient agreed. 2 drops lavender eo placed on 2x2 gauze in medication cup and given to patient for inhalation.  Delford Field, RN

## 2015-03-10 NOTE — Care Management Note (Signed)
Case Management Note  Patient Details  Name: Tara Brown MRN: [id number] Date of Birth: July 28, 1961  Subjective/Objective: Psych-inpt rehab.                  Action/Plan:d/c inpt rehab.   Expected Discharge Date:                  Expected Discharge Plan:  Psychiatric Hospital  In-House Referral:  Clinical Social Work  Discharge planning Services  CM Consult  Post Acute Care Choice:  NA Choice offered to:  NA  DME Arranged:    DME Agency:     HH Arranged:    HH Agency:     Status of Service:  Completed, signed off  Medicare Important Message Given:    Date Medicare IM Given:    Medicare IM give by:    Date Additional Medicare IM Given:    Additional Medicare Important Message give by:     If discussed at Long Length of Stay Meetings, dates discussed:    Additional Comments:  Lanier Clam, RN 03/10/2015, 11:53 AM

## 2015-03-10 NOTE — Progress Notes (Signed)
Clinical Social Work  Update on bed search:  [facility]- admissions discussing case with medical dr to determine if they can admit  [facility]- per [facility]) reports no anticipated DC to be able to accept patient today  [facility]- no available beds  [facility]- no available beds  [facility]- message left with admissions to determine if they can accept  [facility]- admissions reports referral has not been reviewed at this time  [facility]- no available beds.  [facility]- Referral faxed 6/28. CSW left another message with admissions  [facility]- no available beds  [facility]- denied on 6/29  Patient requested update on DC plans. Patient tearful and reports she does not have the money to afford hospital stay and wants to leave. CSW explained psych MD requesting inpatient stay due to patient being unable to contract safety. Patient reports it does not matter if she is still suicidal because she is worried about money. CSW explained that search has been completing and waiting on a bed to become available. Patient understanding of plans and CSW will continue to keep patient updated on plans.  [place], [place] [phone]

## 2015-03-10 NOTE — Progress Notes (Signed)
Clinical Social Work  CSW continues to search for bed placement and contacted the following facilities:  [facility]- admissions will call CSW back  [facility]- per [facility] Inetta Fermo) no beds available at this time but patient on waiting list  [facility]- no available beds  [facility]- no available beds  [facility]- possible beds available this afternoon. Referral faxed.  - unsure of bed status but encouraged CSW to send referral. Referral faxed.  [facility]- no available beds.  [facility]- Referral faxed 6/28. CSW left another message with admissions  [facility]- no available beds  [facility]- denied on 6/29  CSW will continue to follow.  [place], [place] [phone]

## 2015-03-10 NOTE — Clinical Social Work Psych Assess (Signed)
Clinical Social Work Nature conservation officer  Clinical Social Worker:  Boone Master, [place] Date/Time:  03/10/2015, 10:38 AM Referred By:  Physician Date Referred:  03/10/15 Reason for Referral:  Behavioral Health Issues   Presenting Symptoms/Problems  Presenting Symptoms/Problems(in person's/family's own words):  Psych consulted due to SI and depression.   Abuse/Neglect/Trauma History  Abuse/Neglect/Trauma History:  Denies History Abuse/Neglect/Trauma History Comments (indicate dates):  N/A   Psychiatric History  Psychiatric History:  Denies History Psychiatric Medication:  N/A   Current Mental Health Hospitalizations/Previous Mental Health History:  Patient reports she saw a therapist in the past when her mother passed away in 11-21-2007 but no current treatment.   Current Provider:  None currently Place and Date:  N/A  Current Medications:   Scheduled Meds: . cefTRIAXone (ROCEPHIN)  IV  1 g Intravenous Q24H  . enoxaparin (LOVENOX) injection  40 mg Subcutaneous Q24H  . escitalopram  20 mg Oral Daily  . feeding supplement (ENSURE ENLIVE)  237 mL Oral BID BM  . folic acid  1 mg Oral Daily  . predniSONE  10 mg Oral Q breakfast   Continuous Infusions:  PRN Meds:.acetaminophen **OR** acetaminophen, ondansetron **OR** ondansetron (ZOFRAN) IV, oxyCODONE-acetaminophen, zolpidem     Previous Inpatient Admission/Date/Reason:  None reported   Emotional Health/Current Symptoms  Suicide/Self Harm: Has a Plan for Suicide, Suicidal Ideation (ex. "I can't take anymore, I wish I could disappear") Suicide Attempt in Past (date/description):  Patient reports she feels depressed and suicidal. Patient has a plan and is unable to contract for safety.  Other Harmful Behavior (ex. homicidal ideation) (describe):  None reported   Psychotic/Dissociative Symptoms  Psychotic/Dissociative Symptoms: None Reported Other Psychotic/Dissociative Symptoms:   N/A   Attention/Behavioral Symptoms  Attention/Behavioral Symptoms: Within Normal Limits Other Attention/Behavioral Symptoms:  N/A   Cognitive Impairment  Cognitive Impairment:  Within Normal Limits Other Cognitive Impairment:  Patient alert and oriented.   Mood and Adjustment  Mood and Adjustment:  Depression, Flat, Guarded   Stress, Anxiety, Trauma, Any Recent Loss/Stressor  Stress, Anxiety, Trauma, Any Recent Loss/Stressor: Grief/Loss (recent or history) Anxiety (frequency):  N/A  Phobia (specify):  N/A  Compulsive Behavior (specify):  N/A  Obsessive Behavior (specify):  N/A  Other Stress, Anxiety, Trauma, Any Recent Loss/Stressor:  Patient's mother passed away in 2007-11-21.   Substance Abuse/Use  Substance Abuse/Use: None SBIRT Completed (please refer for detailed history): No Self-reported Substance Use (last use and frequency):  Patient denies any substance use.  Urinary Drug Screen Completed: Yes Alcohol Level:  <5   Environment/Housing/Living Arrangement  Environmental/Housing/Living Arrangement: Stable Housing Who is in the Home:  Alone  Emergency Contact:  Sisters   Financial  Financial: Medicare   Patient's Strengths and Goals  Patient's Strengths and Goals (patient's own words):  Patient is aware of depression and requests assistance with managing her symptoms.   Clinical Social Worker's Interpretive Summary  Clinical Social Workers Interpretive Summary:  CSW received referral due to depression and staffed case with psych MD (Dr. Adele Schilder). Medical MD reports that patient is medically stable to DC and psych MD recommending inpatient hospitalization.  CSW reviewed chart and started inpatient placement search.  CSW met with patient at bedside and explained process. Patient open to discussing history of depression and treatment but denies any current treatment. Patient is unable to contract for safety and feels that she needs inpatient placement  prior to returning home. Patient has limited support but does speak of sisters who live in [place]. Patient interested  in placement in [place] but [facility] does not have any beds available and [facility] declined patient. CSW explained process and that [facility] would not be an option at this time.  CSW spoke with [facility] Otila Kluver) at [facility] who requested additional information re: patient completing her own ADLs. Per RN and patient, patient independent with ambulating and does not require any equipment. [facility] will alert CSW if bed becomes available.  Patient has flat affect and reports feeling depressed. Patient reports feeling relieved that she will get some help and is hopeful to have LT care in the community where she can have consistent visits with psychiatrist and therapist.    Disposition  Disposition: Inpatient Referral Made [facility], [facility], [place])   [place], [place] [phone]

## 2015-03-10 NOTE — Consult Note (Signed)
Follow Up Psychiatry Note    Patient Identification: Geniene List MRN:  [id number] Principal Diagnosis: Depression, major depressive disorder, severe Diagnosis:   Patient Active Problem List   Diagnosis Date Noted  . Protein-calorie malnutrition, severe [E43] 03/08/2015  . Sepsis secondary to UTI [A41.9, N39.0] 03/08/2015  . Leukopenia [D72.819] 03/08/2015  . Suicidal ideation [R45.851] 03/08/2015  . Depression [F32.9] 03/08/2015  . Anemia of chronic disease [D63.8] 03/08/2015  . MDD (major depressive disorder), recurrent episode, severe [F33.2] 03/08/2015  . Psoriatic arthritis [L40.50] 03/07/2015    Total Time spent with patient: 20 minutes  Subjective:   Shaya Reddick is a 54 y.o. female patient admitted with depression, suicidal thoughts, generalized weakness and unable to do her ADLs.  She has been not eating and drinking very well.  She was found UTI.  HPI:  Patient seen chart reviewed.  Patient is 54 year old Caucasian, divorced, unemployed female who was admitted on the medical floor after she was brought into the emergency room from [facility].  She was severely depressed and having suicidal thoughts.  She has been not taking her medication, not eating well and not drinking well for past one week.  She is very concerned because of her multiple health issues.  She has severe osteoporosis and arthritis and lately she has noticed more limitation due to pain and health issues.  She lives by herself.  She does not want to be burden to her family.  She admitted lately crying spells, feeling hopeless, helpless and worthless.  Her symptoms started to get worse in recent months.  She expressed that if she cannot handle herself then she rather die.  Though she denies any hallucination or any paranoia but endorsed suicidal plan to hang herself or take overdose on her medication.  She reported that she does not want to live like this.  She has multiple bone fracture and  chronic pain.  Posterior her nephew killed in [place] after jumping from the [place].  She has been thinking a lot about death.  She endorse poor appetite, lack of energy, crying spells, feeling hopeless, helpless and anhedonia.  She denies any mania, psychosis, hallucination or any aggressive behavior.  She lives by herself.  She has a sister who lives in [place].  She has another sister who is a traveling nurse and some time visit her.  Patient is not seeing any psychiatrist at this time.  She has been on Lexapro for more than 5 years prescribed by her primary care physician.  She is not seeing any therapist.  Patient denies drinking or using any illegal substances.   Has the patient been a risk to self in the past 6 months?  Yes.   Has the patient been a risk to self within the distant past?  No. Is the patient a risk to others?  No. Has the patient been a risk to others in the past 6 months?  No. Has the patient been a risk to others within the distant past?  No.  Interval History; (03/09/15) Patient seen chart reviewed.  Patient remains very depressed sad and continued to endorse suicidal thoughts and plan to take overdose or hang herself when she leaves from the hospital.  She admitted racing thoughts and poor sleep.  She feels hopeless, helpless and worthless.  She is concerned about her health issues.  She concerned about her ADLs.  She denies any paranoia or any hallucination but admitted poor self-esteem, anhedonia.  She  cannot contract for safety.  She is taking Lexapro and denies any side effects.  She has nausea.  We recommended to add low-dose Remeron if not medically contraindicated however it is not started yet.  There has been no aggressive behavior.  Interval history; (03/10/15) Patient seen chart reviewed.  She remains depressed however her affect is improved from the past.  She was able to sleep a few hours.  She did not took Remeron because she was concerned about the  Ambien.  She's been taking Ambien for quite some time however it is not helping her sleep.  She still endorsed suicidal thoughts with plan to hang herself or take overdose on her medication.  However she mentioned that she will not do in the hospital but she is more concerned when she leaves the hospital.  She is taking Lexapro and denies any side effects.  She denies any paranoia or any hallucination.  Her energy level remains poor.  She still feels hopeless, helpless and worthless.  There has been no aggression or violence in past 24 hours.  Patient is still waiting for the bed availability in psychiatric facility.  Past Medical History:  Past Medical History  Diagnosis Date  . Osteoporosis   . Psoriatic arthritis   . Anemia     Past Surgical History  Procedure Laterality Date  . Hip fracture surgery    . Knee surgery    . Foot surgery    . Hand surgery    . Neck surgery     Family History:  Family History  Problem Relation Age of Onset  . Stroke Neg Hx    Social History:  Patient lives by herself.  She married however her marriage and last only for 2 years.  She has no children.  She has a sister who lives in [place]. History  Alcohol Use No     History  Drug Use No    History   Social History  . Marital Status: Divorced    Spouse Name: N/A  . Number of Children: N/A  . Years of Education: N/A   Social History Main Topics  . Smoking status: Never Smoker   . Smokeless tobacco: Not on file  . Alcohol Use: No  . Drug Use: No  . Sexual Activity: Not on file   Other Topics Concern  . None   Social History Narrative   Additional Social History:    patient has one psychiatric hospitalization in 2009 at behavioral [facility] when her mother died.  She did not cope well and remember being in the hospital for 1 week.  Patient denies any history of suicidal attempt in the past.  She denies any history of drug use.  Patient denies any history of mania or  psychosis.   Allergies:  No Known Allergies  Labs:  Results for orders placed or performed during the hospital encounter of 03/07/15 (from the past 48 hour(s))  Procalcitonin     Status: None   Collection Time: 03/09/15  4:34 AM  Result Value Ref Range   Procalcitonin 0.26 ng/mL    Comment:        Interpretation: PCT (Procalcitonin) <= 0.5 ng/mL: Systemic infection (sepsis) is not likely. Local bacterial infection is possible. (NOTE)         ICU PCT Algorithm               Non ICU PCT Algorithm    ----------------------------     ------------------------------  PCT < 0.25 ng/mL                 PCT < 0.1 ng/mL     Stopping of antibiotics            Stopping of antibiotics       strongly encouraged.               strongly encouraged.    ----------------------------     ------------------------------       PCT level decrease by               PCT < 0.25 ng/mL       >= 80% from peak PCT       OR PCT 0.25 - 0.5 ng/mL          Stopping of antibiotics                                             encouraged.     Stopping of antibiotics           encouraged.    ----------------------------     ------------------------------       PCT level decrease by              PCT >= 0.25 ng/mL       < 80% from peak PCT        AND PCT >= 0.5 ng/mL            Continuin g antibiotics                                              encouraged.       Continuing antibiotics            encouraged.    ----------------------------     ------------------------------     PCT level increase compared          PCT > 0.5 ng/mL         with peak PCT AND          PCT >= 0.5 ng/mL             Escalation of antibiotics                                          strongly encouraged.      Escalation of antibiotics        strongly encouraged.     Vitals: Blood pressure 111/81, pulse 67, temperature 98.1 F (36.7 C), temperature source Oral, resp. rate 20, height 5\' 6"  (1.676 m), weight 58.7 kg (129 lb 6.6 oz),  SpO2 98 %.  Risk to Self: Is patient at risk for suicide?: Yes Risk to Others:   Prior Inpatient Therapy:   Prior Outpatient Therapy:    Current Facility-Administered Medications  Medication Dose Route Frequency Provider Last Rate Last Dose  . acetaminophen (TYLENOL) tablet 650 mg  650 mg Oral Q6H PRN , MD   650 mg at 03/08/15 2003   Or  . acetaminophen (TYLENOL) suppository 650 mg  650 mg Rectal Q6H PRN 2004, MD      .  cefTRIAXone (ROCEPHIN) 1 g in dextrose 5 % 50 mL IVPB  1 g Intravenous Q24H Eduard Clos, MD   1 g at 03/09/15 1807  . enoxaparin (LOVENOX) injection 40 mg  40 mg Subcutaneous Q24H Eduard Clos, MD   40 mg at 03/10/15 0921  . escitalopram (LEXAPRO) tablet 20 mg  20 mg Oral Daily Eduard Clos, MD   20 mg at 03/10/15 2876  . feeding supplement (ENSURE ENLIVE) (ENSURE ENLIVE) liquid 237 mL  237 mL Oral BID BM Eduard Clos, MD   237 mL at 03/10/15 0921  . folic acid (FOLVITE) tablet 1 mg  1 mg Oral Daily Eduard Clos, MD   1 mg at 03/10/15 8115  . ondansetron (ZOFRAN) tablet 4 mg  4 mg Oral Q6H PRN Eduard Clos, MD       Or  . ondansetron [facility]) injection 4 mg  4 mg Intravenous Q6H PRN Eduard Clos, MD      . oxyCODONE-acetaminophen (PERCOCET/ROXICET) 5-325 MG per tablet 1 tablet  1 tablet Oral Q4H PRN Eduard Clos, MD   1 tablet at 03/10/15 0805  . predniSONE (DELTASONE) tablet 10 mg  10 mg Oral Q breakfast Eduard Clos, MD   10 mg at 03/10/15 0800  . zolpidem (AMBIEN) tablet 5 mg  5 mg Oral QHS PRN Eduard Clos, MD   5 mg at 03/09/15 2341    Musculoskeletal: Strength & Muscle Tone: decreased and atrophy Gait & Station: Unable to assess as patient was lying on the bed Patient leans: N/A  Psychiatric Specialty Exam: Physical Exam  Constitutional: She is oriented to person, place, and time.  Musculoskeletal:  Joint deformity on her both hand  Neurological: She is  alert and oriented to person, place, and time.  Psychiatric:  Suicidal thoughts and plan to kill herself by hanging or taking overdose    Review of Systems  Constitutional: Positive for malaise/fatigue. Negative for weight loss.  Cardiovascular: Negative for chest pain and palpitations.  Gastrointestinal: Positive for nausea.  Musculoskeletal: Positive for joint pain.  Neurological: Negative for dizziness, tingling and tremors.  Psychiatric/Behavioral: Positive for depression and suicidal ideas. Negative for hallucinations and substance abuse. The patient has insomnia.     Blood pressure 111/81, pulse 67, temperature 98.1 F (36.7 C), temperature source Oral, resp. rate 20, height 5\' 6"  (1.676 m), weight 58.7 kg (129 lb 6.6 oz), SpO2 98 %.Body mass index is 20.9 kg/(m^2).  General Appearance: Tearful  Eye Contact::  Minimal  Speech:  Slow  Volume:  Decreased  Mood:  Depressed, Dysphoric, Hopeless and Worthless  Affect:  Constricted, Depressed and Flat  Thought Process:  Intact  Orientation:  Full (Time, Place, and Person)  Thought Content:  Rumination  Suicidal Thoughts:  Yes.  with intent/plan  Homicidal Thoughts:  No  Memory:  Immediate;   Fair Recent;   Fair Remote;   Fair  Judgement:  Impaired  Insight:  Shallow  Psychomotor Activity:  Decreased  Concentration:  Fair  Recall:  [employer] of Knowledge:Fair  Language: Fair  Akathisia:  No  Handed:  Right  AIMS (if indicated):     Assets:  Communication Skills Housing  ADL's:  Impaired  Cognition: WNL  Sleep:      Medical Decision Making: Review of Psycho-Social Stressors (1), Review or order clinical lab tests (1), Established Problem, Worsening (2), Review of Last Therapy Session (1), Review of Medication Regimen & Side Effects (2) and  Review of New Medication or Change in Dosage (2)  Treatment Plan Summary: Medication management and Plan Continue Lexapro 20 mg daily, consider discontinue Ambien since it is not  working.  Start Remeron 15 mg at bedtime if not medically contraindicated to help sleep, depression and appetite.  Plan:  Recommend psychiatric Inpatient admission when medically cleared., continue sitter for safety. Disposition: Patient is waiting transfer to inpatient psychiatric facility for further treatment and stabilization.    Tully Mcinturff T. 03/10/2015 10:03 AM

## 2015-03-10 NOTE — Progress Notes (Signed)
Patient states the lavender eo was effective in relaxing her. Delford Field, RN

## 2015-03-11 ENCOUNTER — Inpatient Hospital Stay
Admit: 2015-03-11 | Discharge: 2015-03-16 | DRG: 885 | Disposition: A | Discharge status: Home / Self Care | Payer: Medicare Other | Source: Intra-hospital | Attending: Psychiatry | Admitting: Psychiatry

## 2015-03-11 DIAGNOSIS — Z79899 Other long term (current) drug therapy: Secondary | ICD-10-CM

## 2015-03-11 DIAGNOSIS — Z9889 Other specified postprocedural states: Secondary | ICD-10-CM | POA: Diagnosis not present

## 2015-03-11 DIAGNOSIS — D638 Anemia in other chronic diseases classified elsewhere: Secondary | ICD-10-CM | POA: Diagnosis present

## 2015-03-11 DIAGNOSIS — N390 Urinary tract infection, site not specified: Secondary | ICD-10-CM | POA: Diagnosis present

## 2015-03-11 DIAGNOSIS — Z823 Family history of stroke: Secondary | ICD-10-CM

## 2015-03-11 DIAGNOSIS — I10 Essential (primary) hypertension: Secondary | ICD-10-CM | POA: Diagnosis present

## 2015-03-11 DIAGNOSIS — M810 Age-related osteoporosis without current pathological fracture: Secondary | ICD-10-CM | POA: Diagnosis present

## 2015-03-11 DIAGNOSIS — L4050 Arthropathic psoriasis, unspecified: Secondary | ICD-10-CM | POA: Diagnosis present

## 2015-03-11 DIAGNOSIS — G4700 Insomnia, unspecified: Secondary | ICD-10-CM | POA: Diagnosis present

## 2015-03-11 DIAGNOSIS — Z808 Family history of malignant neoplasm of other organs or systems: Secondary | ICD-10-CM

## 2015-03-11 DIAGNOSIS — Z803 Family history of malignant neoplasm of breast: Secondary | ICD-10-CM | POA: Diagnosis not present

## 2015-03-11 DIAGNOSIS — F332 Major depressive disorder, recurrent severe without psychotic features: Principal | ICD-10-CM

## 2015-03-11 DIAGNOSIS — Z96659 Presence of unspecified artificial knee joint: Secondary | ICD-10-CM | POA: Diagnosis present

## 2015-03-11 DIAGNOSIS — D72819 Decreased white blood cell count, unspecified: Secondary | ICD-10-CM | POA: Diagnosis present

## 2015-03-11 DIAGNOSIS — R45851 Suicidal ideations: Secondary | ICD-10-CM | POA: Diagnosis present

## 2015-03-11 HISTORY — DX: Major depressive disorder, recurrent severe without psychotic features: F33.2

## 2015-03-11 LAB — PROCALCITONIN: Procalcitonin: <0.1 ng/mL

## 2015-03-11 MED ORDER — ZOLPIDEM TARTRATE 5 MG PO TABS
5.0000 mg | ORAL_TABLET | Freq: Every day | ORAL | Status: DC
  Administered 2015-03-11 – 2015-03-14 (×4): 5 mg via ORAL
  Filled 2015-03-11 (×4): qty 1

## 2015-03-11 MED ORDER — CALCIUM CARBONATE ANTACID 500 MG PO CHEW
1000.0000 mg | CHEWABLE_TABLET | Freq: Every day | ORAL | Status: DC
  Administered 2015-03-12 – 2015-03-16 (×5): 1000 mg via ORAL
  Filled 2015-03-11 (×10): qty 5

## 2015-03-11 MED ORDER — FOLIC ACID 1 MG PO TABS
1.0000 mg | ORAL_TABLET | Freq: Every day | ORAL | Status: DC
  Administered 2015-03-12 – 2015-03-16 (×5): 1 mg via ORAL
  Filled 2015-03-11 (×7): qty 1

## 2015-03-11 MED ORDER — ALUM & MAG HYDROXIDE-SIMETH 200-200-20 MG/5ML PO SUSP
30.0000 mL | ORAL | Status: DC | PRN
Start: 2015-03-11 — End: 2015-03-16

## 2015-03-11 MED ORDER — PREDNISONE 10 MG PO TABS
10.0000 mg | ORAL_TABLET | Freq: Every day | ORAL | Status: DC
  Administered 2015-03-12 – 2015-03-16 (×5): 10 mg via ORAL
  Filled 2015-03-11 (×5): qty 1

## 2015-03-11 MED ORDER — NICOTINE 10 MG IN INHA: 1.0000 | RESPIRATORY_TRACT | Status: DC | PRN

## 2015-03-11 MED ORDER — METHOTREXATE 2.5 MG PO TABS
10.0000 mg | ORAL_TABLET | ORAL | Status: DC
  Administered 2015-03-12: 10 mg via ORAL
  Filled 2015-03-11: qty 4

## 2015-03-11 MED ORDER — TRAZODONE HCL 50 MG PO TABS: 50.0000 mg | ORAL_TABLET | Freq: Every evening | ORAL | Status: DC | PRN

## 2015-03-11 MED ORDER — ENSURE ENLIVE PO LIQD
237.0000 mL | Freq: Three times a day (TID) | ORAL | Status: DC
  Administered 2015-03-11 – 2015-03-16 (×15): 237 mL via ORAL

## 2015-03-11 MED ORDER — TRAMADOL HCL 50 MG PO TABS
100.0000 mg | ORAL_TABLET | Freq: Three times a day (TID) | ORAL | Status: DC
  Administered 2015-03-11 (×2): 100 mg via ORAL
  Filled 2015-03-11 (×2): qty 2

## 2015-03-11 MED ORDER — CIPROFLOXACIN HCL 500 MG PO TABS
500.0000 mg | ORAL_TABLET | Freq: Two times a day (BID) | ORAL | Status: AC
  Administered 2015-03-11 – 2015-03-15 (×9): 500 mg via ORAL
  Filled 2015-03-11 (×9): qty 1

## 2015-03-11 MED ORDER — VENLAFAXINE HCL ER 37.5 MG PO CP24
37.5000 mg | ORAL_CAPSULE | Freq: Every day | ORAL | Status: DC
  Administered 2015-03-12 – 2015-03-13 (×2): 37.5 mg via ORAL
  Filled 2015-03-11 (×4): qty 1

## 2015-03-11 MED ORDER — ACETAMINOPHEN 650 MG RE SUPP
650.0000 mg | Freq: Four times a day (QID) | RECTAL | Status: DC | PRN
  Filled 2015-03-11: qty 1

## 2015-03-11 MED ORDER — POLYETHYLENE GLYCOL 3350 17 G PO PACK: 17.0000 g | PACK | Freq: Every day | ORAL | Status: DC | PRN

## 2015-03-11 MED ORDER — ACETAMINOPHEN 325 MG PO TABS: 650.0000 mg | ORAL_TABLET | Freq: Four times a day (QID) | ORAL | Status: DC | PRN

## 2015-03-11 MED ORDER — MAGNESIUM HYDROXIDE 400 MG/5ML PO SUSP: 30.0000 mL | Freq: Every day | ORAL | Status: DC | PRN

## 2015-03-11 MED ORDER — FERROUS SULFATE 325 (65 FE) MG PO TABS
325.0000 mg | ORAL_TABLET | Freq: Two times a day (BID) | ORAL | Status: DC
  Administered 2015-03-11 – 2015-03-16 (×10): 325 mg via ORAL
  Filled 2015-03-11 (×10): qty 1

## 2015-03-11 MED ORDER — LIDOCAINE 5 % EX PTCH
1.0000 | MEDICATED_PATCH | CUTANEOUS | Status: DC
  Administered 2015-03-11: 1 via TRANSDERMAL
  Filled 2015-03-11 (×2): qty 1

## 2015-03-11 MED ORDER — ENSURE ENLIVE PO LIQD: 237.0000 mL | Freq: Every morning | ORAL | Status: DC

## 2015-03-11 MED ORDER — ESCITALOPRAM OXALATE 10 MG PO TABS
20.0000 mg | ORAL_TABLET | Freq: Every day | ORAL | Status: DC
Start: 2015-03-12 — End: 2015-03-11

## 2015-03-11 MED ORDER — LISINOPRIL 20 MG PO TABS
20.0000 mg | ORAL_TABLET | Freq: Every day | ORAL | Status: DC
  Administered 2015-03-11 – 2015-03-16 (×6): 20 mg via ORAL
  Filled 2015-03-11 (×6): qty 1

## 2015-03-11 MED ORDER — IBUPROFEN 600 MG PO TABS: 600.0000 mg | ORAL_TABLET | Freq: Four times a day (QID) | ORAL | Status: DC | PRN

## 2015-03-11 NOTE — Progress Notes (Signed)
Called report Amy Tiburcio Pea at [facility]. Awaiting transport.  Lenox Ponds, RN

## 2015-03-11 NOTE — [facility] Group Notes (Signed)
[facility] Group Notes:  (Nursing/MHT/Case Management/Adjunct)  Date:  03/11/2015  Time:  2:02PM  Type of Therapy:  Group Therapy  Participation Level:  Did Not Attend  Summary of Progress/Problems:  Tara Brown 03/11/2015, 5:08 PM

## 2015-03-11 NOTE — [facility] Counselor (Signed)
Pt. accepted to [facility]. Accepting physician is Dr. Ardyth Harps. Attending Physician will be Dr. Ardyth Harps. Pt. has been assigned to room 309, by [facility] Charge Nurse Gwynn F. Call report to [phone]. Social Work Heritage manager) have been made aware of it.

## 2015-03-11 NOTE — Progress Notes (Signed)
Received from [facility], a 54 year old female with a diagnosis of depression with suicidal ideation. Patient reports decline in health with decreased ability to perform ADLs independently with resultant increased depressed and anxious mood, suicidal thoughts with vague plans, increased hopelessness, and loss of appetite.  See admission database for complete clinical data. Patient was cooperative with all nursing interventions. A search was done and there was no contraband found. Verifying nurse was Marylu Lund, Charity fundraiser.

## 2015-03-11 NOTE — Progress Notes (Signed)
Clinical Social Work  CSW spoke with [facility] Inetta Fermo) at [facility] who reports unsure if they can accept patient due to staffing issues. CSW spoke with Jerilynn Som at [facility] who reports he will call CSW back within a couple of hours with determination. CSW will continue to follow.  [place], [place] [phone]

## 2015-03-11 NOTE — Progress Notes (Signed)
Clinical Social Work  Patient accepted to [facility] to room 309. RN to call report to [phone]. Patient signed voluntary form which was faxed to [facility] and original copy placed on chart.   CSW met with patient at bedside who reports she is happy to DC to receive [facility] treatment. Patient reports she was tired of waiting and feels she needs more intensive therapy. Patient asked CSW to assist calling her sister Nira Conn) to inform her of DC plans. CSW and patient spoke with sister who is aware of plans and will visit patient after transfer.  CSW arranged transportation via [facility] is signing off but available if needed.  [place], [place] [phone]

## 2015-03-11 NOTE — H&P (Signed)
Psychiatric Admission Assessment Adult  Patient Identification: Tara Brown MRN:  [id number] Date of Evaluation:  03/11/2015 Chief Complaint:  Depression Principal Diagnosis: MDD (major depressive disorder), recurrent episode, severe Diagnosis:   Patient Active Problem List   Diagnosis Date Noted  . Psoriatic arthritis [L40.50] 03/11/2015  . Osteoporosis [M81.0] 03/11/2015  . Sepsis secondary to UTI [A41.9, N39.0] 03/08/2015  . Leukopenia [D72.819] 03/08/2015  . Anemia of chronic disease [D63.8] 03/08/2015  . MDD (major depressive disorder), recurrent episode, severe [F33.2] 03/08/2015   History of Present Illness:  The patient is a 54 year old divorced Caucasian female from [facility] [place]. This patient has a history of depression and severe psoriatic arthritis.  Patient was referred for psychiatric admission by [facility]. The patient was admitted there earlier this week due to UTI and sepsis. Per notes the patient was found in her apartment by her sisters very weak and dehydrated therefore she was taking to the emergency department in [place]. Patient reported that she had not been taking her medications and had not been eating or drinking for a few days due to severe depression. As patient voiced suicidal ideation psychiatry got consulted.  While at [facility] patient reported thoughts of wanting to hang herself or overdose on her medications.  Patient reports a long history of depression for which she has been receiving care by her primary care provider with Lexapro. The patient has been taking Lexapro 20 mg for about 5 years with good response. She explains that 2 months ago she started having severe right shoulder pain.  The pain has limited significantly her ability to perform ADLs as she is right-handed. In addition her left arm has been affected by psoriatic arthritis and in a past fracture.  Patient complains of not being able to watch or brushing her  hair, she is unable to drive however she sees she forces herself to drive short distances but she is aware that she should not be driving as she cannot lift her arms to grab the wheel. Patient has family around her but she does not want to live in her independence and become a burden for her relatives.  Patient currently only has Medicare which is barely enough to cover her for rent and other bills. She states she has a friend who works at [employer] and has told her she does not qualify for any additional services at this time.  Substance abuse history: Patient denies any history of alcohol or drug use patient denies abusing any nicotine products.  HPI Elements:  Severity: sevre Timing: chronic with acute exacerbation Duration: 2 months Context: physical limitation, inability  to perform ADL, worsening pain  Past psychiatry History: This patient had behavior health in [place] in 2009 for depression. Patient states she was hospitalized there after her mother passed away. She was kept in the hospital for about 5 days. She denies any history of suicidal attempts or self-injurious behaviors. Patient reports taking Lexapro prescribed by her primary care provider for about 5 years. In the past she tried Prozac and citalopram and she does not remember what her reaction was to these 2 agents.  Past Medical History: Severe osteoporosis, psoriatic arthritis, anemia of chronic illness.  Patient has had multiple surgical repair of fractures on hip, knee, foot, hand and neck.  Currently she is awaiting conduction status he on her arm due to severe shoulder pain. This is that he will determine whether or not this injury is surgical. Patient just discharged from [place],  due to UTI and sepsis. Past Medical History  Diagnosis Date  . Osteoporosis   . Psoriatic arthritis   . Anemia   . Depression     Past Surgical History  Procedure Laterality Date  . Hip fracture surgery    . Knee surgery    .  Foot surgery    . Hand surgery    . Neck surgery     Family History: Patient has a nephew who committed suicide at age 81 about 2 years ago. His nephew was diagnosed with schizophrenia. The patient also reports having a means (status sister of the nephew who committed suicide) who has attempted suicide as well.  Her mother died of bone and breast cancer. Family History  Problem Relation Age of Onset  . Stroke Neg Hx    Social History: Patient has been divorced since 2009. She does not have any children. 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 She is currently on disability due to her chronic medical conditions.. Patient receives $1140/m on disability. Patient has 3 sisters, 2 sisters live in [place] one of them in [place].   History  Alcohol Use No     History  Drug Use No    History   Social History  . Marital Status: Divorced    Spouse Name: N/A  . Number of Children: N/A  . Years of Education: N/A   Social History Main Topics  . Smoking status: Never Smoker   . Smokeless tobacco: Never Used  . Alcohol Use: No  . Drug Use: No  . Sexual Activity: No   Other Topics Concern  . None   Social History Narrative    Musculoskeletal: Strength & Muscle Tone: spastic Gait & Station: normal Patient leans: N/A  Psychiatric Specialty Exam: Physical Exam  Review of Systems  Constitutional: Negative.   HENT: Negative.   Eyes: Negative.   Respiratory: Negative.   Cardiovascular: Negative.   Gastrointestinal: Negative.   Genitourinary: Positive for frequency.  Musculoskeletal: Positive for joint pain.  Skin: Negative.   Neurological: Negative.   Endo/Heme/Allergies: Negative.   Psychiatric/Behavioral: Positive for depression. Negative for hallucinations, memory loss and substance abuse. The patient has insomnia.        Passive suicidal thoughts    Blood pressure 100/60, pulse 103,  temperature 98 F (36.7 C), temperature source Oral, resp. rate 20, height 5\' 6"  (1.676 m), weight 56.7 kg (125 lb), SpO2 96 %.Body mass index is 20.19 kg/(m^2).  General Appearance: Well Groomed  ::  Good  Speech:  Clear and Coherent  Volume:  Normal  Mood:  Dysphoric  Affect:  Appropriate  Thought Process:  Linear and Logical  Orientation:  Full (Time, Place, and Person)  Thought Content:  Hallucinations: None  Suicidal Thoughts:  No  Homicidal Thoughts:  No  Memory:  Immediate;   Good Recent;   Good Remote;   Good  Judgement:  Fair  Insight:  Good  Psychomotor Activity:  Decreased  Concentration:  Good  Recall:  NA  Fund of Knowledge:Good  Language: Good  Akathisia:  No  Handed:    AIMS (if indicated):     Assets:  [phone] Housing Intimacy Social Support Vocational/Educational  ADL's:  Impaired  Cognition: WNL  Sleep:       Physical examination completed by the hospitalist at [facility]: Pt is alert, follows  commands appropriately, not in acute distress Cardiovascular: Regular rate and rhythm, S1/S2 +, no murmurs Respiratory: Clear to auscultation bilaterally, no wheezing, no crackles, no rhonchi Abdominal: Soft, non tender, non distended, bowel sounds +, no guarding Extremities: no edema, no cyanosis, pulses palpable bilaterally DP and PT Neuro: Grossly nonfocal  Allergies:  KNDA  Lab Results:  Results for orders placed or performed during the hospital encounter of 03/07/15 (from the past 48 hour(s))  Procalcitonin     Status: None   Collection Time: 03/11/15  4:33 AM  Result Value Ref Range   Procalcitonin <0.10 ng/mL    Comment:        Interpretation: PCT (Procalcitonin) <= 0.5 ng/mL: Systemic infection (sepsis) is not likely. Local bacterial infection is possible. (NOTE)         ICU PCT Algorithm               Non ICU PCT Algorithm    ----------------------------      ------------------------------         PCT < 0.25 ng/mL                 PCT < 0.1 ng/mL     Stopping of antibiotics            Stopping of antibiotics       strongly encouraged.               strongly encouraged.    ----------------------------     ------------------------------       PCT level decrease by               PCT < 0.25 ng/mL       >= 80% from peak PCT       OR PCT 0.25 - 0.5 ng/mL          Stopping of antibiotics                                             encouraged.     Stopping of antibiotics           encouraged.    ----------------------------     ------------------------------       PCT level decrease by              PCT >= 0.25 ng/mL       < 80% from peak PCT        AND PCT >= 0.5 ng/mL            Continuin g antibiotics                                              encouraged.       Continuing antibiotics            encouraged.    ----------------------------     ------------------------------     PCT level increase compared          PCT > 0.5 ng/mL         with peak PCT AND          PCT >= 0.5 ng/mL             Escalation of antibiotics  strongly encouraged.      Escalation of antibiotics        strongly encouraged.    Current Medications: Current Facility-Administered Medications  Medication Dose Route Frequency Provider Last Rate Last Dose  . acetaminophen (TYLENOL) tablet 650 mg  650 mg Oral Q6H PRN Jimmy Footman, MD      . alum & mag hydroxide-simeth (MAALOX/MYLANTA) 200-200-20 MG/5ML suspension 30 mL  30 mL Oral Q4H PRN Jimmy Footman, MD      . ciprofloxacin (CIPRO) tablet 500 mg  500 mg Oral BID Jimmy Footman, MD      . feeding supplement (ENSURE ENLIVE) (ENSURE ENLIVE) liquid 237 mL  237 mL Oral TID BM Jimmy Footman, MD      . Melene Muller ON 03/12/2015] folic acid (FOLVITE) tablet 1 mg  1 mg Oral Daily Jimmy Footman, MD      . ibuprofen (ADVIL,MOTRIN) tablet 600  mg  600 mg Oral Q6H PRN Jimmy Footman, MD      . lidocaine (LIDODERM) 5 % 1 patch  1 patch Transdermal Q24H Jimmy Footman, MD      . lisinopril (PRINIVIL,ZESTRIL) tablet 20 mg  20 mg Oral Daily Jimmy Footman, MD      . magnesium hydroxide (MILK OF MAGNESIA) suspension 30 mL  30 mL Oral Daily PRN Jimmy Footman, MD      . Melene Muller ON 03/12/2015] methotrexate (RHEUMATREX) tablet 10 mg  10 mg Oral Q Fri Jimmy Footman, MD      . nicotine (NICOTROL) 10 MG inhaler 1 continuous puffing  1 continuous puffing Inhalation PRN Jimmy Footman, MD      . Melene Muller ON 03/12/2015] predniSONE (DELTASONE) tablet 10 mg  10 mg Oral Q breakfast Jimmy Footman, MD      . traMADol Janean Sark) tablet 100 mg  100 mg Oral TID Jimmy Footman, MD      . venlafaxine XR (EFFEXOR-XR) 24 hr capsule 37.5 mg  37.5 mg Oral Q breakfast Jimmy Footman, MD      . zolpidem (AMBIEN) tablet 5 mg  5 mg Oral QHS Jimmy Footman, MD       PTA Medications: Prescriptions prior to admission  Medication Sig Dispense Refill Last Dose  . ciprofloxacin (CIPRO) 500 MG tablet Take 1 tablet (500 mg total) by mouth 2 (two) times daily. 10 tablet 0 03/11/2015 at Unknown time  . escitalopram (LEXAPRO) 20 MG tablet Take 20 mg by mouth daily.   03/11/2015 at Unknown time  . Etanercept (ENBREL [place]) Inject into the skin.     . folic acid (FOLVITE) 1 MG tablet Take 1 mg by mouth daily.   03/11/2015 at Unknown time  . lisinopril (PRINIVIL,ZESTRIL) 40 MG tablet Take 40 mg by mouth daily.   03/11/2015 at Unknown time  . methotrexate (RHEUMATREX) 2.5 MG tablet Take 10 mg by mouth once a week. Caution:Chemotherapy. Protect from light.   03/05/2015 at unknown  . Omeprazole (PRILOSEC PO) Take by mouth.   03/11/2015 at Unknown time  . predniSONE (DELTASONE) 10 MG tablet Take 10 mg by mouth daily with breakfast.   03/11/2015 at Unknown time      Results for orders  placed or performed during the hospital encounter of 03/07/15 (from the past 72 hour(s))  Procalcitonin     Status: None   Collection Time: 03/09/15  4:34 AM  Result Value Ref Range   Procalcitonin 0.26 ng/mL    Comment:        Interpretation: PCT (Procalcitonin) <= 0.5 ng/mL: Systemic infection (sepsis)  is not likely. Local bacterial infection is possible. (NOTE)         ICU PCT Algorithm               Non ICU PCT Algorithm    ----------------------------     ------------------------------         PCT < 0.25 ng/mL                 PCT < 0.1 ng/mL     Stopping of antibiotics            Stopping of antibiotics       strongly encouraged.               strongly encouraged.    ----------------------------     ------------------------------       PCT level decrease by               PCT < 0.25 ng/mL       >= 80% from peak PCT       OR PCT 0.25 - 0.5 ng/mL          Stopping of antibiotics                                             encouraged.     Stopping of antibiotics           encouraged.    ----------------------------     ------------------------------       PCT level decrease by              PCT >= 0.25 ng/mL       < 80% from peak PCT        AND PCT >= 0.5 ng/mL            Continuin g antibiotics                                              encouraged.       Continuing antibiotics            encouraged.    ----------------------------     ------------------------------     PCT level increase compared          PCT > 0.5 ng/mL         with peak PCT AND          PCT >= 0.5 ng/mL             Escalation of antibiotics                                          strongly encouraged.      Escalation of antibiotics        strongly encouraged.   Procalcitonin     Status: None   Collection Time: 03/11/15  4:33 AM  Result Value Ref Range   Procalcitonin <0.10 ng/mL    Comment:        Interpretation: PCT (Procalcitonin) <= 0.5 ng/mL: Systemic infection (sepsis) is not likely. Local  bacterial infection is possible. (NOTE)         ICU PCT Algorithm  Non ICU PCT Algorithm    ----------------------------     ------------------------------         PCT < 0.25 ng/mL                 PCT < 0.1 ng/mL     Stopping of antibiotics            Stopping of antibiotics       strongly encouraged.               strongly encouraged.    ----------------------------     ------------------------------       PCT level decrease by               PCT < 0.25 ng/mL       >= 80% from peak PCT       OR PCT 0.25 - 0.5 ng/mL          Stopping of antibiotics                                             encouraged.     Stopping of antibiotics           encouraged.    ----------------------------     ------------------------------       PCT level decrease by              PCT >= 0.25 ng/mL       < 80% from peak PCT        AND PCT >= 0.5 ng/mL            Continuin g antibiotics                                              encouraged.       Continuing antibiotics            encouraged.    ----------------------------     ------------------------------     PCT level increase compared          PCT > 0.5 ng/mL         with peak PCT AND          PCT >= 0.5 ng/mL             Escalation of antibiotics                                          strongly encouraged.      Escalation of antibiotics        strongly encouraged.    Treatment Plan Summary: Daily contact with patient to assess and evaluate symptoms and progress in treatment and Medication management   MDD: Patient reports not currently benefiting from Lexapro therefore this medication will be discontinued. I will start the patient on Effexor XR 37.5 mg by mouth daily.  Insomnia: I will continue the patient on Ambien 5 mg by mouth daily at bedtime  Severe shoulder pain: Continue tramadol 100 mg by mouth 3 times a day. I will start the patient on a Lidoderm patch daily.  I will add ibuprofen 400 mg every 6 hours when necessary  UTI:  Continue Cipro 500 mg by mouth twice a day for 4 more days  Hypertension: Continue lisinopril however I will decrease the dose to 20 mg by mouth daily as patient has not been taking her home dose of 40 mg since she was admitted at [facility]  Psoriatic arthritis: Continue prednisone 10 mg by mouth daily and methotrexate 10 mg every weekly (continue folic acid).    Anemia: Patient will be reassessed started on iron supplementation.  Osteoporosis: Continue the patient on calcium   Precautions: Every 15 minute checks  Discharge disposition: Once stable the patient will be discharged back home. Social worker will attempt to connect the patient with home health services.   Medical Decision Making:  Established Problem, Worsening (2)  I certify that inpatient services furnished can reasonably be expected to improve the patient's condition.   Jimmy Footman 6/30/20164:01 PM

## 2015-03-11 NOTE — [facility] Group Notes (Signed)
[facility] Group Notes:  (Nursing/MHT/Case Management/Adjunct)  Date:  03/11/2015  Time:  11:27 PM  Type of Therapy:  Evening Wrap-up Group  Participation Level:  Minimal  Participation Quality:  Appropriate  Affect:  Appropriate  Cognitive:  Appropriate  Insight:  Good  Engagement in Group:  Good  Modes of Intervention:  Activity  Summary of Progress/Problems:  Tomasita Morrow 03/11/2015, 11:27 PM

## 2015-03-11 NOTE — Progress Notes (Signed)
Nutrition Follow-up  DOCUMENTATION CODES:  Severe malnutrition in context of social or environmental circumstances  INTERVENTION: - Will decrease Ensure Enlive to once/day per pt request - RD will continue to monitor for needs  NUTRITION DIAGNOSIS:  Malnutrition related to social / environmental circumstances as evidenced by moderate depletions of muscle mass, moderate depletion of body fat, severe depletion of body fat, severe depletion of muscle mass. -ongoing  GOAL:  Patient will meet greater than or equal to 90% of their needs -met on average  MONITOR:  PO intake, Supplement acceptance, Weight trends, Labs  ASSESSMENT: 53 y.o. female with history of psoriatic arthritis, hypertension and chronic anemia was brought to the ER at [facility] because of depression and suicidal ideation. As per the patient patient has not been eating well over the last 1 week because of depression as patient states her general medical condition has not been improving with regard to her joints. Patient states last week she stopped taking her Lexapro due to lack of confidence and restarted taking it again 2 days ago. Patient has not been taking her other medications and has not been eating well.   Pt eating 100% on average since assessment 6/27. She reports good appetite and eating without difficulty. She drinks a few sips of Ensure Enlive each time she receives it but is interested in order being decreased to once/day.  Meeting needs. Medications reviewed. Labs reviewed; Ca: 8.3 mg/dL.  Height:  Ht Readings from Last 1 Encounters:  03/08/15 5' 6" (1.676 m)    Weight:  Wt Readings from Last 1 Encounters:  03/08/15 129 lb 6.6 oz (58.7 kg)    Ideal Body Weight:  59.1 kg (kg)  Wt Readings from Last 10 Encounters:  03/08/15 129 lb 6.6 oz (58.7 kg)  10/07/13 170 lb (77.111 kg)    BMI:  Body mass index is 20.9 kg/(m^2).  Estimated Nutritional Needs:  Kcal:  1425-1625  Protein:   65-75 grams  Fluid:  2.2-2.5 L/day  Skin:  Wound (see comment) (L leg abrasion)  Diet Order:  Diet - low sodium heart healthy Diet regular Room service appropriate?: Yes; Fluid consistency:: Thin  EDUCATION NEEDS:  No education needs identified at this time   Intake/Output Summary (Last 24 hours) at 03/11/15 0937 Last data filed at 03/11/15 0513  Gross per 24 hour  Intake    770 ml  Output      0 ml  Net    770 ml    Last BM:  PTA     , RD, LDN Inpatient Clinical Dietitian Pager # [phone] After hours/weekend pager # [phone]  

## 2015-03-11 NOTE — Progress Notes (Signed)
ANTIBIOTIC CONSULT NOTE - FOLLOW UP  Pharmacy Consult for ceftriaxone Indication: UTI  No Known Allergies  Patient Measurements: Height: 5\' 6"  (167.6 cm) Weight: 129 lb 6.6 oz (58.7 kg) IBW/kg (Calculated) : 59.3   Vital Signs: Temp: 97.7 F (36.5 C) (06/30 0631) Temp Source: Oral (06/30 0631) BP: 151/80 mmHg (06/30 0631) Pulse Rate: 68 (06/30 0631) Intake/Output from previous day: 06/29 0701 - 06/30 0700 In: 1010 [P.O.:960; IV Piggyback:50] Out: -  Intake/Output from this shift:    Labs: No results for input(s): WBC, HGB, PLT, LABCREA, CREATININE in the last 72 hours. Estimated Creatinine Clearance: 61.4 mL/min (by C-G formula based on Cr of 0.96). No results for input(s): VANCOTROUGH, VANCOPEAK, VANCORANDOM, GENTTROUGH, GENTPEAK, GENTRANDOM, TOBRATROUGH, TOBRAPEAK, TOBRARND, AMIKACINPEAK, AMIKACINTROU, AMIKACIN in the last 72 hours.   Microbiology: Recent Results (from the past 720 hour(s))  Urine culture     Status: None   Collection Time: 03/07/15  6:32 PM  Result Value Ref Range Status   Specimen Description URINE, CLEAN CATCH  Final   Special Requests NONE  Final   Culture   Final    30,000 COLONIES/mL ESCHERICHIA COLI Performed at [facility]    Report Status 03/10/2015 FINAL  Final   Organism ID, Bacteria ESCHERICHIA COLI  Final      Susceptibility   Escherichia coli - MIC*    AMPICILLIN >=32 RESISTANT Resistant     CEFAZOLIN 16 SENSITIVE Sensitive     CEFTRIAXONE <=1 SENSITIVE Sensitive     CIPROFLOXACIN <=0.25 SENSITIVE Sensitive     GENTAMICIN <=1 SENSITIVE Sensitive     IMIPENEM <=0.25 SENSITIVE Sensitive     NITROFURANTOIN <=16 SENSITIVE Sensitive     TRIMETH/SULFA >=320 RESISTANT Resistant     AMPICILLIN/SULBACTAM >=32 RESISTANT Resistant     PIP/TAZO >=128 RESISTANT Resistant     * 30,000 COLONIES/mL ESCHERICHIA COLI  Culture, blood (routine x 2)     Status: None (Preliminary result)   Collection Time: 03/07/15  9:00 PM  Result Value  Ref Range Status   Specimen Description BLOOD RIGHT ANTECUBITAL  Final   Special Requests BOTTLES DRAWN AEROBIC AND ANAEROBIC 10CC EA  Final   Culture   Final    NO GROWTH 2 DAYS Performed at [facility]    Report Status PENDING  Incomplete  Culture, blood (routine x 2)     Status: None (Preliminary result)   Collection Time: 03/07/15  9:20 PM  Result Value Ref Range Status   Specimen Description BLOOD RIGHT FOREARM  Final   Special Requests BOTTLES DRAWN AEROBIC AND ANAEROBIC 10CC EA  Final   Culture   Final    NO GROWTH 2 DAYS Performed at [facility]    Report Status PENDING  Incomplete  MRSA PCR Screening     Status: None   Collection Time: 03/07/15 10:42 PM  Result Value Ref Range Status   MRSA by PCR NEGATIVE NEGATIVE Final    Comment:        The GeneXpert MRSA Assay (FDA approved for NASAL specimens only), is one component of a comprehensive MRSA colonization surveillance program. It is not intended to diagnose MRSA infection nor to guide or monitor treatment for MRSA infections.     Anti-infectives    Start     Dose/Rate Route Frequency Ordered Stop   03/09/15 0000  ciprofloxacin (CIPRO) 500 MG tablet     500 mg Oral 2 times daily 03/09/15 0959     03/08/15 1800  cefTRIAXone (  ROCEPHIN) 1 g in dextrose 5 % 50 mL IVPB     1 g 100 mL/hr over 30 Minutes Intravenous Every 24 hours 03/08/15 0025     03/07/15 1945  cefTRIAXone (ROCEPHIN) 1 g in dextrose 5 % 50 mL IVPB     1 g 100 mL/hr over 30 Minutes Intravenous  Once 03/07/15 1932 03/07/15 2023   03/07/15 1937  cefTRIAXone (ROCEPHIN) 1 G injection    Comments:  Alfonzo Feller   : cabinet override      03/07/15 1937 03/07/15 1949      Assessment: Patient's a  54 y.o F on ceftriaxone day #4 for Ecoli UTI.  Patient remains afebrile.  PCT <0.10   6/27 >>ceftriaxone  >>  6/26 blood:x2: NGTD 6/ 26 urine: 30K e. Coli (R to amp, amp/sulb, pip/tazo, tmp/smz; S to cefazolin, ceftriaxone)  Plan:  -  continue ceftriaxone 1gm IV q24h - pharmacy will sign off since no renal adjustment is needed with ceftriaxone  Leler Brion P 03/11/2015,8:36 AM

## 2015-03-11 NOTE — Care Management (Signed)
Important Message  Patient Details  Name: Tara Brown MRN: [id number] Date of Birth: [birth year]   Medicare Important Message Given:  Yes-second notification given    Renie Ora, LCSW 03/11/2015, 3:09 PM

## 2015-03-11 NOTE — Progress Notes (Addendum)
ANTIBIOTIC CONSULT NOTE - INITIAL  Pharmacy Consult for Cipro Indication: E coli UTI  Not on File  Patient Measurements: Height: 5\' 6"  (167.6 cm) Weight: 125 lb (56.7 kg) IBW/kg (Calculated) : 59.3   Vital Signs: Temp: 98 F (36.7 C) (06/30 1300) Temp Source: Oral (06/30 1300) BP: 100/60 mmHg (06/30 1300) Pulse Rate: 103 (06/30 1300) Intake/Output from previous day:   Intake/Output from this shift:    Labs: No results for input(s): WBC, HGB, PLT, LABCREA, CREATININE in the last 72 hours. Estimated Creatinine Clearance: 59.3 mL/min (by C-G formula based on Cr of 0.96). No results for input(s): VANCOTROUGH, VANCOPEAK, VANCORANDOM, GENTTROUGH, GENTPEAK, GENTRANDOM, TOBRATROUGH, TOBRAPEAK, TOBRARND, AMIKACINPEAK, AMIKACINTROU, AMIKACIN in the last 72 hours.   Microbiology: Recent Results (from the past 720 hour(s))  Urine culture     Status: None   Collection Time: 03/07/15  6:32 PM  Result Value Ref Range Status   Specimen Description URINE, CLEAN CATCH  Final   Special Requests NONE  Final   Culture   Final    30,000 COLONIES/mL ESCHERICHIA COLI Performed at [facility]    Report Status 03/10/2015 FINAL  Final   Organism ID, Bacteria ESCHERICHIA COLI  Final      Susceptibility   Escherichia coli - MIC*    AMPICILLIN >=32 RESISTANT Resistant     CEFAZOLIN 16 SENSITIVE Sensitive     CEFTRIAXONE <=1 SENSITIVE Sensitive     CIPROFLOXACIN <=0.25 SENSITIVE Sensitive     GENTAMICIN <=1 SENSITIVE Sensitive     IMIPENEM <=0.25 SENSITIVE Sensitive     NITROFURANTOIN <=16 SENSITIVE Sensitive     TRIMETH/SULFA >=320 RESISTANT Resistant     AMPICILLIN/SULBACTAM >=32 RESISTANT Resistant     PIP/TAZO >=128 RESISTANT Resistant     * 30,000 COLONIES/mL ESCHERICHIA COLI  Culture, blood (routine x 2)     Status: None (Preliminary result)   Collection Time: 03/07/15  9:00 PM  Result Value Ref Range Status   Specimen Description BLOOD RIGHT ANTECUBITAL  Final   Special  Requests BOTTLES DRAWN AEROBIC AND ANAEROBIC 10CC EA  Final   Culture   Final    NO GROWTH 2 DAYS Performed at [facility]    Report Status PENDING  Incomplete  Culture, blood (routine x 2)     Status: None (Preliminary result)   Collection Time: 03/07/15  9:20 PM  Result Value Ref Range Status   Specimen Description BLOOD RIGHT FOREARM  Final   Special Requests BOTTLES DRAWN AEROBIC AND ANAEROBIC 10CC EA  Final   Culture   Final    NO GROWTH 2 DAYS Performed at [facility]    Report Status PENDING  Incomplete  MRSA PCR Screening     Status: None   Collection Time: 03/07/15 10:42 PM  Result Value Ref Range Status   MRSA by PCR NEGATIVE NEGATIVE Final    Comment:        The GeneXpert MRSA Assay (FDA approved for NASAL specimens only), is one component of a comprehensive MRSA colonization surveillance program. It is not intended to diagnose MRSA infection nor to guide or monitor treatment for MRSA infections.     Medical History: Past Medical History  Diagnosis Date  . Osteoporosis   . Psoriatic arthritis   . Anemia   . Depression     Medications:  Scheduled:  . ciprofloxacin  500 mg Oral BID  . feeding supplement (ENSURE ENLIVE)  237 mL Oral TID BM  . [START ON 03/12/2015]  folic acid  1 mg Oral Daily  . lidocaine  1 patch Transdermal Q24H  . lisinopril  20 mg Oral Daily  . [START ON 03/12/2015] methotrexate  10 mg Oral Q Fri  . [START ON 03/12/2015] predniSONE  10 mg Oral Q breakfast  . traMADol  100 mg Oral TID  . venlafaxine XR  37.5 mg Oral Q breakfast  . zolpidem  5 mg Oral QHS   Infusions:   PRN: acetaminophen **OR** [DISCONTINUED] acetaminophen, alum & mag hydroxide-simeth, ibuprofen, magnesium hydroxide, nicotine Assessment: Patient with E. Coli UTI previously on ceftriaxone at [facility] now admitted to [facility] to begin ciprofloxacin to which the organism is sensitive per micro.   Goal of Therapy:  Resolution of infection  Plan:  Will begin  ciprofloxacin 500 mg po bid x 4 more days per MD note.   Luisa Hart D 03/11/2015,3:55 PM

## 2015-03-11 NOTE — Tx Team (Signed)
Initial Interdisciplinary Treatment Plan   PATIENT STRESSORS: Financial difficulties Health problems   PATIENT STRENGTHS: Average or above average intelligence Supportive family/friends   PROBLEM LIST: Problem List/Patient Goals Date to be addressed Date deferred Reason deferred Estimated date of resolution  depression      Suicidal ideation                                                 DISCHARGE CRITERIA:  Ability to meet basic life and health needs Improved stabilization in mood, thinking, and/or behavior  PRELIMINARY DISCHARGE PLAN: Outpatient therapy Return to previous living arrangement  PATIENT/FAMIILY INVOLVEMENT: This treatment plan has been presented to and reviewed with the patient, Tara Brown, and/or family member. The patient and family have been given the opportunity to ask questions and make suggestions.  Ericberto Padget [place] 03/11/2015, 11:57 AM

## 2015-03-11 NOTE — BHH Suicide Risk Assessment (Signed)
[facility] Admission Suicide Risk Assessment   Nursing information obtained from:  Patient Demographic factors:  Living alone Current Mental Status:  Suicidal ideation indicated by patient Loss Factors:  Decline in physical health Historical Factors:  NA Risk Reduction Factors:  Positive social support Total Time spent with patient: 1 hour Principal Problem: MDD (major depressive disorder), recurrent episode, severe Diagnosis:   Patient Active Problem List   Diagnosis Date Noted  . Psoriatic arthritis [L40.50] 03/11/2015  . Osteoporosis [M81.0] 03/11/2015  . Sepsis secondary to UTI [A41.9, N39.0] 03/08/2015  . Leukopenia [D72.819] 03/08/2015  . Anemia of chronic disease [D63.8] 03/08/2015  . MDD (major depressive disorder), recurrent episode, severe [F33.2] 03/08/2015     Continued Clinical Symptoms:  Alcohol Use Disorder Identification Test Final Score (AUDIT): 0 The "Alcohol Use Disorders Identification Test", Guidelines for Use in Primary Care, Second Edition.  World Science writer [facility]). Score between 0-7:  no or low risk or alcohol related problems. Score between 8-15:  moderate risk of alcohol related problems. Score between 16-19:  high risk of alcohol related problems. Score 20 or above:  warrants further diagnostic evaluation for alcohol dependence and treatment.   CLINICAL FACTORS:   Depression:   Anhedonia Severe Previous Psychiatric Diagnoses and Treatments Medical Diagnoses and Treatments/Surgeries    Psychiatric Specialty Exam: Physical Exam  ROS    COGNITIVE FEATURES THAT CONTRIBUTE TO RISK:  None    SUICIDE RISK:   Moderate:  Frequent suicidal ideation with limited intensity, and duration, some specificity in terms of plans, no associated intent, good self-control, limited dysphoria/symptomatology, some risk factors present, and identifiable protective factors, including available and accessible social support.  PLAN OF CARE: admit to [facility]  Medical  Decision Making:  Established Problem, Worsening (2)  I certify that inpatient services furnished can reasonably be expected to improve the patient's condition.   Jimmy Footman 03/11/2015, 3:59 PM

## 2015-03-11 NOTE — Progress Notes (Signed)
Patient is medically stable for discharge today  She will continue abx as prescribed Please refer to D/C summary done 03/09/2015. No changes in medical management since 6/282016.   Manson Passey [facility] [phone]

## 2015-03-12 LAB — TSH: TSH: 1.337 u[IU]/mL (ref 0.350–4.500)

## 2015-03-12 LAB — VITAMIN B12: Vitamin B-12: 288 pg/mL (ref 180–914)

## 2015-03-12 MED ORDER — WHITE PETROLATUM GEL
Status: DC | PRN
  Filled 2015-03-12: qty 5

## 2015-03-12 MED ORDER — LIDOCAINE 5 % EX PTCH
2.0000 | MEDICATED_PATCH | CUTANEOUS | Status: DC
  Administered 2015-03-12 – 2015-03-13 (×2): 1 via TRANSDERMAL
  Administered 2015-03-14 – 2015-03-15 (×2): 2 via TRANSDERMAL
  Filled 2015-03-12 (×6): qty 2

## 2015-03-12 MED ORDER — TRAMADOL HCL 50 MG PO TABS
100.0000 mg | ORAL_TABLET | Freq: Three times a day (TID) | ORAL | Status: DC
  Administered 2015-03-12 – 2015-03-16 (×17): 100 mg via ORAL
  Filled 2015-03-12 (×17): qty 2

## 2015-03-12 NOTE — Evaluation (Signed)
Occupational Therapy Evaluation Patient Details Name: Clela Hagadorn MRN: [id number] DOB: [birth year] Today's Date: 03/12/2015    History of Present Illness  This patient is a 54 year old female who came to [facility] to the psychiatric floor. She has psoriatic arthritis.    Clinical Impression   This patient is a 54 year old female who came to [facility] to the psychiatric floor. She has psoriatic arthritis. She lives in an apartment and has adapted pretty well but has trouble with the below listed areas. Patient taught regarding assistive devices that may aid in helping her stay independent listed below. Patient has a catalog at home and reports she will look for those items. No further Occupational Therapy  Needed.    Follow Up Recommendations  No OT follow up    Equipment Recommendations   (below suggestions)    Recommendations for Other Services       Precautions / Restrictions Precautions Precautions: None Restrictions Weight Bearing Restrictions: No      Mobility Bed Mobility                  Transfers                      Balance Overall balance assessment: Independent                                          ADL                                         General ADL Comments: Patient reports difficulty with opening jars and cans, door knobs, stove knobs, washing and combing hair. Suggested builty up handles, adaptable knobs, extended sponge and comb/brush, Patient has a catalog at home and reports she will look for those things.     Vision     Perception     Praxis      Pertinent Vitals/Pain       Hand Dominance     Extremity/Trunk Assessment Upper Extremity Assessment Upper Extremity Assessment:  (Severe psoriatic arthritis and severe limitations in ROM)           Communication Communication Communication: No difficulties   Cognition  Arousal/Alertness: Awake/alert Behavior During Therapy: WFL for tasks assessed/performed Overall Cognitive Status: Within Functional Limits for tasks assessed                     General Comments       Exercises       Shoulder Instructions      Home Living Family/patient expects to be discharged to:: Private residence (Appartment) Living Arrangements: Alone                 Bathroom Shower/Tub: Tub/shower unit                    Prior Functioning/Environment Level of Independence:  (mostly Independent, needed some help from neibor to open a can can't reach head to wash or comb hair, diffiuclty with nobs ect.)             OT Diagnosis:  (Psoriatic Arthritis)   OT Problem List: Decreased range of motion;Decreased activity tolerance;Decreased coordination;Decreased knowledge of use of DME or AE  OT Treatment/Interventions:      OT Goals(Current goals can be found in the care plan section) Acute Rehab OT Goals Patient Stated Goal: To go back home - discussed possibly getting a smaller appartment. OT Goal Formulation: With patient Potential to Achieve Goals: Good  OT Frequency:     Barriers to D/C:            Co-evaluation              End of Session Equipment Utilized During Treatment:  (Suggested above listed equipment)  Activity Tolerance: Patient tolerated treatment well Patient left: Other (comment) (Patient left in locked unit with staff all around)   Time: 3557-3220 OT Time Calculation (min): 24 min Charges:  OT General Charges $OT Visit: 1 Procedure OT Evaluation $Initial OT Evaluation Tier I: 1 Procedure OT Treatments $Self Care/Home Management : 8-22 mins G-Codes:    Gwyndolyn Kaufman, MS/OTR/L  03/12/2015, 11:38 AM

## 2015-03-12 NOTE — Tx Team (Signed)
Interdisciplinary Treatment Plan Update (Adult)  Date:  03/12/2015  Time Reviewed: 3:55 PM   Progress in Treatment: Attending groups: Yes. Participating in groups:  Yes. Taking medication as prescribed:  Yes. Tolerating medication:  Yes. Family/Significant othe contact made:  No, not yet  Patient understands diagnosis:  Yes. Discussing patient identified problems/goals with staff: Yes Medical problems stabilized or resolved:  Yes. Denies suicidal/homicidal ideation:  Yes  Issues/concerns per patient self-inventory:  Other:  New problem(s) identified: N/A  Discharge Plan or Barriers: CSW assessing.   Reason for Continuation of Hospitalization: Depression  Suicidal Ideation   Comments: The patient is a 54 year old divorced Caucasian female from [facility]: [place] Tara Brown. This patient has a history of depression and severe psoriatic arthritis. Patient was referred for psychiatric admission by [facility]. The patient was admitted there earlier this week due to UTI and sepsis. Per notes the patient was found in her apartment by her sisters very weak and dehydrated therefore she was taking to the emergency department in [place]. Patient reported that she had not been taking her medications and had not been eating or drinking for a few days due to severe depression. As patient voiced suicidal ideation psychiatry got consulted.While at [facility] patient reported thoughts of wanting to hang herself or overdose on her medications.Patient reports a long history of depression for which she has been receiving care by her primary care provider with Lexapro. The patient has been taking Lexapro 20 mg for about 5 years with good response. She explains that 2 months ago she started having severe right shoulder pain. The pain has limited significantly her ability to perform ADLs as she is right-handed. In addition her left arm has been affected by psoriatic arthritis and in a past fracture.    Estimated length of stay: 5-7 days   New goal(s): NA  Review of initial/current patient goals per problem list:   Attendees: Patient: Tara Brown  6/23/20162:27 PM  Family:   6/23/20162:27 PM  Physician:  Tara Journey, MD 6/23/20162:27 PM  Nursing:     6/23/20162:27 PM  Clinical Social Work:Tara Brown, [place] 6/23/20162:27 PM  Other:  Tara Grist, LCSW 6/23/20162:27 PM  Other:   6/23/20162:27 PM  Other:   6/23/20162:27 PM  Other:   6/23/20162:27 PM  Other:  6/23/20162:27 PM  Other:  6/23/20162:27 PM  Other:  6/23/20162:27 PM  Other:  6/23/20162:27 PM  Other:  6/23/20162:27 PM  Other:  6/23/20162:27 PM  Other:   6/23/20162:27 PM   Scribe for Treatment Team:   Tara Brown, MSW, [facility] 03/12/2015 3:55 PM

## 2015-03-12 NOTE — Plan of Care (Signed)
Problem: Acute Rehab OT Goals (only OT should resolve) Goal: OT Additional ADL Goal #1 Patient to understand assistive devices available to aid in ADL

## 2015-03-12 NOTE — Plan of Care (Signed)
Problem: Alteration in mood Goal: STG-Patient reports thoughts of self-harm to staff Outcome: Progressing Pt endorses passive SI but contracts for safety with staff

## 2015-03-12 NOTE — BHH Group Notes (Signed)
BHH Group Notes:  (Nursing/MHT/Case Management/Adjunct)  Date:  03/12/2015  Time:  11:47 AM  Type of Therapy:  Psychoeducational Skills  Participation Level:  Active  Participation Quality:  Appropriate and Sharing  Affect:  Appropriate  Cognitive:  Appropriate and Oriented  Insight:  Good and Limited  Engagement in Group:  Improving and Limited  Modes of Intervention:  Activity, Discussion, Education and Exploration  Summary of Progress/Problems:  Foy Guadalajara 03/12/2015, 11:47 AM

## 2015-03-12 NOTE — Evaluation (Signed)
Physical Therapy Evaluation Patient Details Name: Tara Brown MRN: [id number] DOB: November 01, 1960 Today's Date: 03/12/2015   History of Present Illness  Patient is a pleasant 54 y/o female that presents to [facility] with major depressive episode and some indications of ideations of self harm. Patient has severe psoriatic arthritis limiting her grip strength and UE functional use.   Clinical Impression  Patient is a 54 y/o female that presents to the behavioral health unit after a depressive episode. Patient lives at home alone and has been able to drive herself recently, and functionally appears at her "baseline". Patient has psoriatic/rheumatoid? Arthritis which has caused significant ulnar drifting and decreased functioning of her digits. Patient has sufficient grip strength for an AD if needed, but does not display any balance deficits today and has not had any reported falls. At this time patient would benefit more from outpatient PT services to address her UE mobility and functional deficits and really does not display any mobility safety deficits that could be addressed in the inpatient setting. As per OT note, patient may benefit more from adaptive equipment, and during this session adaptive steering wheel was discussed. Patient displays adequate RTC strength in neutral flexion at this time, so concern of subluxation is low. Patient was provided with scapular retractions as a preliminary UE strengthening exercise, but to increase her function will need more adaptive equipment and outpatient PT services where more equipment would be available to address her UE functional deficits. Patient has no needs identified from an acute mobility safety perspective and acute PT will sign off.     Follow Up Recommendations Outpatient PT    Equipment Recommendations       Recommendations for Other Services       Precautions / Restrictions Precautions Precautions: None Restrictions Weight Bearing  Restrictions: No      Mobility  Bed Mobility               General bed mobility comments: Patient received in hallway.   Transfers Overall transfer level: Independent                  Ambulation/Gait Ambulation/Gait assistance: Independent Ambulation Distance (Feet): 150 Feet   Gait Pattern/deviations: Drifts right/left;Staggering left     General Gait Details: Patient displays mild staggering/drifting to her left as she is unable to keep her shoulders level (left is below right) secondary to scapulo-thoracic/trunk extensor weakness.   Stairs            Wheelchair Mobility    Modified Rankin (Stroke Patients Only)       Balance Overall balance assessment: Independent                                           Pertinent Vitals/Pain Pain Assessment:  (She reports some lower back pain, but this is mild today. )    Home Living Family/patient expects to be discharged to:: Private residence Living Arrangements: Alone   Type of Home: House Home Access: Level entry     Home Layout: One level Home Equipment: None      Prior Function Level of Independence:  (mostly Independent, needed some help from neibor to open a can can't reach head to wash or comb hair, diffiuclty with nobs ect.)         Comments: Mostly independent though she does require some assistance from a neighbor with hair  hygeine given her limited UE mobility. She is still driving, but fears she may not be able to for much longer given her UE weakness.      Hand Dominance        Extremity/Trunk Assessment   Upper Extremity Assessment:  (Patient is limited in UE to roughly 45 degrees of flexion  secondary to pain/weakness and displays significant UT substitution pattern. Patient displays appropriate WFL rotator cuff strength in shoulder flexion, abd, ext, IR, in 0 degrees of flexion. )           Lower Extremity Assessment: Overall WFL for tasks assessed       Cervical / Trunk Assessment: Normal  Communication   Communication: No difficulties  Cognition Arousal/Alertness: Awake/alert Behavior During Therapy: WFL for tasks assessed/performed Overall Cognitive Status: Within Functional Limits for tasks assessed                      General Comments      Exercises Other Exercises Other Exercises: Scapular retractions x 5 in sitting.       Assessment/Plan    PT Assessment Patient needs continued PT services  PT Diagnosis Difficulty walking   PT Problem List Pain;Decreased mobility;Decreased balance  PT Treatment Interventions     PT Goals (Current goals can be found in the Care Plan section) Acute Rehab PT Goals Patient Stated Goal: To return home safely and continue driving.  PT Goal Formulation: With patient Time For Goal Achievement: 03/26/15 Potential to Achieve Goals: Good    Frequency  (DC in house )   Barriers to discharge        Co-evaluation               End of Session   Activity Tolerance: Patient tolerated treatment well Patient left: in chair           Time: 1345-1400 PT Time Calculation (min) (ACUTE ONLY): 15 min   Charges:   PT Evaluation $Initial PT Evaluation Tier I: 1 Procedure     PT G Codes:       Kerin Ransom, PT, DPT   03/12/2015, 2:15 PM

## 2015-03-12 NOTE — Progress Notes (Signed)
Patient ID: Tara Brown, female   DOB: 1961-05-12, 54 y.o.   MRN: [id number] PER STATE REGULATIONS 482.30  THIS CHART WAS REVIEWED FOR MEDICAL NECESSITY WITH RESPECT TO THE PATIENT'S ADMISSION/DURATION OF STAY.  NEXT REVIEW DATE: 03/15/15  Loura Halt, RN, BSN CASE MANAGER

## 2015-03-12 NOTE — BHH Group Notes (Signed)
[facility] LCSW Aftercare Discharge Planning Group Note   03/12/2015 3:40 PM  Participation Quality:  Did not attend.   Tara Brown L Denny Lave MSW, [address]

## 2015-03-12 NOTE — Progress Notes (Signed)
D: Pt is awake in the dayroom this evening watching TV. Pt mood is depressed and her affect is sad, but she is interacting with her peers and is pleasant and cooperative with staff. Pt endorses passive SI but does contract for safety.  A: Writer provided emotional support and administered medications as prescribed.   R: Pt is taking medications and attending groups.

## 2015-03-12 NOTE — Progress Notes (Signed)
Patient has been awake and alert today.  Affect has been flat. She denies suicidal or homicidal thoughts.  Complains of pain in her lower back a few times and was medicated as ordered.  Will cont to monitor for safety.

## 2015-03-12 NOTE — Progress Notes (Signed)
[facility] MD Progress Note  03/12/2015 10:45 AM Tara Brown  MRN:  [id number]  Subjective:  Patient reports still feeling depressed. She has been reporting passive suicidal ideation to nursing. Today she said she will never hurt herself as she does not want to hurt her family again, patient explains that her nephew committed suicide about 2 years ago and the family had a very difficult time dealing with that. Patient feels she is not gaining any benefit from being in the hospital. "There's nothing to do here". Patient states she did not attend any groups yesterday because she did not enjoy community meeting and also because the chairs were too hard and she couldn't sit on them for very long.  As far as pain patient still complains of having moderate to severe pain on her shoulder and lower back. She continues to request increase the dose of tramadol to a total of 400 mg a day. She also requested another Lidoderm patch to lower back.  Patient is sleep was good last night and she reports good appetite and energy as well.  Patient has restarted treatment with Effexor at this morning she denies any side effects.  Principal Problem: Major depressive disorder, recurrent, severe without psychotic features Diagnosis:   Patient Active Problem List   Diagnosis Date Noted  . Psoriatic arthritis [L40.50] 03/11/2015  . Osteoporosis [M81.0] 03/11/2015  . Major depressive disorder, recurrent, severe without psychotic features [F33.2] 03/11/2015  . Sepsis secondary to UTI [A41.9, N39.0] 03/08/2015  . Leukopenia [D72.819] 03/08/2015  . Anemia of chronic disease [D63.8] 03/08/2015   Total Time spent with patient: 30 minutes   Past Medical History:  Past Medical History  Diagnosis Date  . Osteoporosis   . Psoriatic arthritis   . Anemia   . Depression     Past Surgical History  Procedure Laterality Date  . Hip fracture surgery    . Knee surgery    . Foot surgery    . Hand surgery    . Neck surgery      Family History:  Family History  Problem Relation Age of Onset  . Stroke Neg Hx    Social History:  History  Alcohol Use No     History  Drug Use No    History   Social History  . Marital Status: Divorced    Spouse Name: N/A  . Number of Children: N/A  . Years of Education: N/A   Social History Main Topics  . Smoking status: Never Smoker   . Smokeless tobacco: Never Used  . Alcohol Use: No  . Drug Use: No  . Sexual Activity: No   Other Topics Concern  . None   Social History Narrative   Additional History:    Sleep: Good  Appetite:  Good   Assessment:   Musculoskeletal: Strength & Muscle Tone: spastic Gait & Station: normal Patient leans: N/A   Psychiatric Specialty Exam: Physical Exam  Review of Systems  Constitutional: Negative.   HENT: Negative.   Eyes: Negative.   Respiratory: Negative.   Cardiovascular: Negative.   Gastrointestinal: Negative.   Genitourinary: Negative.   Musculoskeletal: Positive for back pain and joint pain.  Skin: Negative.   Neurological: Negative.   Endo/Heme/Allergies: Negative.   Psychiatric/Behavioral: Positive for depression.       Passive suicidal thoughts    Blood pressure 131/100, pulse 79, temperature 97.9 F (36.6 C), temperature source Oral, resp. rate 20, height 5\' 6"  (1.676 m), weight 56.7 kg (125 lb), SpO2 96 %.  Body mass index is 20.19 kg/(m^2).  General Appearance: Fairly Groomed  Patent attorney::  Fair  Speech:  Normal Rate  Volume:  Normal  Mood:  Dysphoric and Irritable  Affect:  Constricted  Thought Process:  Linear and Logical  Orientation:  Full (Time, Place, and Person)  Thought Content:  Hallucinations: None  Suicidal Thoughts:  No  Homicidal Thoughts:  No  Memory:  Immediate;   Good Recent;   Good Remote;   Good  Judgement:  Fair  Insight:  Good  Psychomotor Activity:  Normal  Concentration:  Good  Recall:  NA  Fund of Knowledge:Good  Language: Good  Akathisia:  no  Handed:  Right  handed  AIMS (if indicated):     Assets:  Communication Skills Desire for Improvement Financial Resources/Insurance Housing Intimacy Social Support Vocational/Educational  ADL's:  Intact  Cognition: WNL  Sleep:  Number of Hours: 6.25     Current Medications: Current Facility-Administered Medications  Medication Dose Route Frequency Provider Last Rate Last Dose  . acetaminophen (TYLENOL) tablet 650 mg  650 mg Oral Q6H PRN Jimmy Footman, MD      . alum & mag hydroxide-simeth (MAALOX/MYLANTA) 200-200-20 MG/5ML suspension 30 mL  30 mL Oral Q4H PRN Jimmy Footman, MD      . calcium carbonate (TUMS - dosed in mg elemental calcium) chewable tablet 1,000 mg of elemental calcium  1,000 mg of elemental calcium Oral Q breakfast Jimmy Footman, MD   1,000 mg of elemental calcium at 03/12/15 0833  . ciprofloxacin (CIPRO) tablet 500 mg  500 mg Oral BID Jimmy Footman, MD   500 mg at 03/12/15 2924  . feeding supplement (ENSURE ENLIVE) (ENSURE ENLIVE) liquid 237 mL  237 mL Oral TID BM Jimmy Footman, MD   237 mL at 03/12/15 0930  . ferrous sulfate tablet 325 mg  325 mg Oral BID WC Jimmy Footman, MD   325 mg at 03/12/15 4628  . folic acid (FOLVITE) tablet 1 mg  1 mg Oral Daily Jimmy Footman, MD      . ibuprofen (ADVIL,MOTRIN) tablet 600 mg  600 mg Oral Q6H PRN Jimmy Footman, MD      . lidocaine (LIDODERM) 5 % 2 patch  2 patch Transdermal Q24H Jimmy Footman, MD      . lisinopril (PRINIVIL,ZESTRIL) tablet 20 mg  20 mg Oral Daily Jimmy Footman, MD   20 mg at 03/12/15 1000  . magnesium hydroxide (MILK OF MAGNESIA) suspension 30 mL  30 mL Oral Daily PRN Jimmy Footman, MD      . methotrexate (RHEUMATREX) tablet 10 mg  10 mg Oral Q Fri Jimmy Footman, MD   10 mg at 03/12/15 1000  . nicotine (NICOTROL) 10 MG inhaler 1 continuous puffing  1 continuous puffing Inhalation PRN  Jimmy Footman, MD      . polyethylene glycol (MIRALAX / GLYCOLAX) packet 17 g  17 g Oral Daily PRN Jimmy Footman, MD      . predniSONE (DELTASONE) tablet 10 mg  10 mg Oral Q breakfast Jimmy Footman, MD   10 mg at 03/12/15 6381  . traMADol (ULTRAM) tablet 100 mg  100 mg Oral TID WC & HS Jimmy Footman, MD      . venlafaxine XR (EFFEXOR-XR) 24 hr capsule 37.5 mg  37.5 mg Oral Q breakfast Jimmy Footman, MD   37.5 mg at 03/12/15 7711  . white petrolatum (VASELINE) gel   Topical PRN Jimmy Footman, MD      . zolpidem [facility]) tablet 5  mg  5 mg Oral QHS Jimmy Footman, MD   5 mg at 03/11/15 2133    Lab Results:  Results for orders placed or performed during the hospital encounter of 03/07/15 (from the past 48 hour(s))  Procalcitonin     Status: None   Collection Time: 03/11/15  4:33 AM  Result Value Ref Range   Procalcitonin <0.10 ng/mL    Comment:        Interpretation: PCT (Procalcitonin) <= 0.5 ng/mL: Systemic infection (sepsis) is not likely. Local bacterial infection is possible. (NOTE)         ICU PCT Algorithm               Non ICU PCT Algorithm    ----------------------------     ------------------------------         PCT < 0.25 ng/mL                 PCT < 0.1 ng/mL     Stopping of antibiotics            Stopping of antibiotics       strongly encouraged.               strongly encouraged.    ----------------------------     ------------------------------       PCT level decrease by               PCT < 0.25 ng/mL       >= 80% from peak PCT       OR PCT 0.25 - 0.5 ng/mL          Stopping of antibiotics                                             encouraged.     Stopping of antibiotics           encouraged.    ----------------------------     ------------------------------       PCT level decrease by              PCT >= 0.25 ng/mL       < 80% from peak PCT        AND PCT >= 0.5 ng/mL             Continuin g antibiotics                                              encouraged.       Continuing antibiotics            encouraged.    ----------------------------     ------------------------------     PCT level increase compared          PCT > 0.5 ng/mL         with peak PCT AND          PCT >= 0.5 ng/mL             Escalation of antibiotics                                          strongly encouraged.  Escalation of antibiotics        strongly encouraged.     Physical Findings: AIMS:  , ,  ,  ,    CIWA:    COWS:     Treatment Plan Summary: Daily contact with patient to assess and evaluate symptoms and progress in treatment and Medication management   MDD: Patient reports not currently benefiting from Lexapro therefore this medication will be discontinued. Patient has been is started on  Effexor XR 37.5 mg by mouth daily.  Insomnia: Continue Ambien 5 mg by mouth daily at bedtime  Severe shoulder pain: Continue tramadol 100 mg but due to severe pain we will change from 3 times a day to 4 times a day. Continue Lidoderm patch but will increase to 2 patches a day one to shoulder in 1 to lower back. I will add ibuprofen 400 mg every 6 hours when necessary  UTI: Continue Cipro 500 mg by mouth twice a day for 3 more days  Hypertension: Continue lisinopril 40 mg by mouth daily.  Psoriatic arthritis: Continue prednisone 10 mg by mouth daily and methotrexate 10 mg every weekly (continue folic acid).   Anemia: Patient will be reassessed started on iron supplementation.  Osteoporosis: Continue the patient on calcium   Precautions: Every 15 minute checks  Labs : will order b12 and TSH today.  PT and OT have been consulted.  Discharge disposition: Once stable the patient will be discharged back home. Social worker will attempt to connect the patient with home health services.   Medical Decision Making:  Established Problem, Stable/Improving (1)     Jimmy Footman 03/12/2015, 10:45 AM

## 2015-03-13 LAB — CULTURE, BLOOD (ROUTINE X 2)
Culture: NO GROWTH
Culture: NO GROWTH

## 2015-03-13 MED ORDER — VENLAFAXINE HCL ER 75 MG PO CP24
75.0000 mg | ORAL_CAPSULE | Freq: Every day | ORAL | Status: DC
  Administered 2015-03-14 – 2015-03-15 (×2): 75 mg via ORAL
  Filled 2015-03-13 (×2): qty 1

## 2015-03-13 NOTE — [facility] Group Notes (Signed)
[facility] LCSW Group Therapy  03/13/2015 3:08 PM  Type of Therapy:  Group Therapy  Participation Level:  Active  Participation Quality:  Attentive  Affect:  Appropriate  Cognitive:  Alert and Appropriate  Insight:  Engaged  Engagement in Therapy:  Engaged  Modes of Intervention:  Discussion, Education, Socialization and Support  Summary of Progress/Problems: Patients were encouraged to define what community means to them. They were encouraged to identify formal and informal support within their community.  Tara Brown states she feels better today compared to yesterday. She states she is more optimistic today. She identified her sisters and a neighbor as her support system. She stated she has found support groups helpful in the past.   Rondall Allegra MSW, LCSWA  03/13/2015, 3:08 PM
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
## 2015-03-13 NOTE — [facility] Group Notes (Signed)
[facility] Group Notes:  (Nursing/MHT/Case Management/Adjunct)  Date:  03/13/2015  Time:  11:54 PM  Type of Therapy:  Group Therapy  Participation Level:  Active  Participation Quality:  Appropriate  Affect:  Appropriate  Cognitive:  Appropriate  Insight:  Appropriate  Engagement in Group:  Engaged  Modes of Intervention:  n/a  Summary of Progress/Problems:  Ekta Dancer Imani Ellijah Leffel 03/13/2015, 11:54 PM 

## 2015-03-13 NOTE — Progress Notes (Signed)
Patient improving in structured environment. She reports decrease in depressed mood and feels more hopeful about the future. Denies SI.  Patient has been visible within the milieu and social with select peers. She has been attending all groups and taking medications as ordered. She has been receptive to teaching, and open to discussing issues with staff.  Will continue current treatment plan as written. Monitor mood, safety, ability to perform ADLs independently. Maintain on unit safety checks.

## 2015-03-13 NOTE — [facility] Group Notes (Signed)
[facility] Group Notes:  (Nursing/MHT/Case Management/Adjunct)  Date:  03/13/2015  Time:  3:30 AM  Type of Therapy:  Group Therapy  Participation Level:  Active  Participation Quality:  Appropriate  Affect:  Appropriate  Cognitive:  Appropriate  Insight:  Good  Engagement in Group:  Engaged  Modes of Intervention:  n/a  Summary of Progress/Problems:  Veva Holes 03/13/2015, 3:30 AM

## 2015-03-13 NOTE — Progress Notes (Signed)
[facility] MD Progress Note  03/13/2015 4:23 PM Ura Yingling  MRN:  [id number]  Subjective:  Ms. Dresden reports much improvement. Her mood is improving, affect is brighter. Her appetite has improved and she is been eating at least 50% of her meals. This is encouraging since the patient reports losing 50 pounds recently. Her sleep is good. She tolerates medications well. She participates in groups. She tells me that on Tuesday at 2:00 she has an appointment with a neurologist in [place] which she would like to keep. This is to establish whether damage in her right shoulder is neurological in nature or something that would require surgery. She had knee replacement surgery in the past and even though she is not thrilled about rehabilitation shoulder dysfunction has been a real problem. She would go for short replacement. She no longer feels suicidal. She's been talking to her family. She understands how suicide of her nephew a year and a half ago affected the family. She would like to start them and is suffering. She is forward thinking and much more optimistic about the future  Principal Problem: Major depressive disorder, recurrent, severe without psychotic features Diagnosis:   Patient Active Problem List   Diagnosis Date Noted  . Psoriatic arthritis [L40.50] 03/11/2015  . Osteoporosis [M81.0] 03/11/2015  . Major depressive disorder, recurrent, severe without psychotic features [F33.2] 03/11/2015  . Sepsis secondary to UTI [A41.9, N39.0] 03/08/2015  . Leukopenia [D72.819] 03/08/2015  . Anemia of chronic disease [D63.8] 03/08/2015   Total Time spent with patient: 20 minutes   Past Medical History:  Past Medical History  Diagnosis Date  . Osteoporosis   . Psoriatic arthritis   . Anemia   . Depression     Past Surgical History  Procedure Laterality Date  . Hip fracture surgery    . Knee surgery    . Foot surgery    . Hand surgery    . Neck surgery     Family History:  Family  History  Problem Relation Age of Onset  . Stroke Neg Hx    Social History:  History  Alcohol Use No     History  Drug Use No    History   Social History  . Marital Status: Divorced    Spouse Name: N/A  . Number of Children: N/A  . Years of Education: N/A   Social History Main Topics  . Smoking status: Never Smoker   . Smokeless tobacco: Never Used  . Alcohol Use: No  . Drug Use: No  . Sexual Activity: No   Other Topics Concern  . None   Social History Narrative   Additional History:    Sleep: Good  Appetite:  Fair   Assessment:   Musculoskeletal: Strength & Muscle Tone: within normal limits Gait & Station: normal Patient leans: N/A   Psychiatric Specialty Exam: Physical Exam  Nursing note and vitals reviewed.   Review of Systems  Musculoskeletal: Positive for joint pain.  All other systems reviewed and are negative.   Blood pressure 138/83, pulse 86, temperature 98.7 F (37.1 C), temperature source Oral, resp. rate 20, height 5\' 6"  (1.676 m), weight 56.7 kg (125 lb), SpO2 96 %.Body mass index is 20.19 kg/(m^2).  General Appearance: Casual  Eye Contact::  Good  Speech:  Clear and Coherent  Volume:  Normal  Mood:  Depressed  Affect:  Constricted  Thought Process:  Goal Directed  Orientation:  Full (Time, Place, and Person)  Thought Content:  WDL  Suicidal  Thoughts:  No  Homicidal Thoughts:  No  Memory:  Immediate;   Fair Recent;   Fair Remote;   Fair  Judgement:  Fair  Insight:  Fair  Psychomotor Activity:  Normal  Concentration:  Fair  Recall:  [employer] of Knowledge:Fair  Language: Fair  Akathisia:  No  Handed:  Right  AIMS (if indicated):     Assets:  Communication Skills Desire for Improvement Financial Resources/Insurance Housing Resilience Social Support  ADL's:  Intact  Cognition: WNL  Sleep:  Number of Hours: 4.25     Current Medications: Current Facility-Administered Medications  Medication Dose Route Frequency  Provider Last Rate Last Dose  . acetaminophen (TYLENOL) tablet 650 mg  650 mg Oral Q6H PRN Jimmy Footman, MD      . alum & mag hydroxide-simeth (MAALOX/MYLANTA) 200-200-20 MG/5ML suspension 30 mL  30 mL Oral Q4H PRN Jimmy Footman, MD      . calcium carbonate (TUMS - dosed in mg elemental calcium) chewable tablet 1,000 mg of elemental calcium  1,000 mg of elemental calcium Oral Q breakfast Jimmy Footman, MD   1,000 mg of elemental calcium at 03/13/15 0846  . ciprofloxacin (CIPRO) tablet 500 mg  500 mg Oral BID Jimmy Footman, MD   500 mg at 03/13/15 0847  . feeding supplement (ENSURE ENLIVE) (ENSURE ENLIVE) liquid 237 mL  237 mL Oral TID BM Jimmy Footman, MD   237 mL at 03/13/15 1422  . ferrous sulfate tablet 325 mg  325 mg Oral BID WC Jimmy Footman, MD   325 mg at 03/13/15 0847  . folic acid (FOLVITE) tablet 1 mg  1 mg Oral Daily Jimmy Footman, MD   1 mg at 03/13/15 0847  . ibuprofen (ADVIL,MOTRIN) tablet 600 mg  600 mg Oral Q6H PRN Jimmy Footman, MD      . lidocaine (LIDODERM) 5 % 2 patch  2 patch Transdermal Q24H Jimmy Footman, MD   1 patch at 03/12/15 1850  . lisinopril (PRINIVIL,ZESTRIL) tablet 20 mg  20 mg Oral Daily Jimmy Footman, MD   20 mg at 03/13/15 0847  . magnesium hydroxide (MILK OF MAGNESIA) suspension 30 mL  30 mL Oral Daily PRN Jimmy Footman, MD      . methotrexate (RHEUMATREX) tablet 10 mg  10 mg Oral Q Fri Jimmy Footman, MD   10 mg at 03/12/15 1000  . nicotine (NICOTROL) 10 MG inhaler 1 continuous puffing  1 continuous puffing Inhalation PRN Jimmy Footman, MD      . polyethylene glycol (MIRALAX / GLYCOLAX) packet 17 g  17 g Oral Daily PRN Jimmy Footman, MD      . predniSONE (DELTASONE) tablet 10 mg  10 mg Oral Q breakfast Jimmy Footman, MD   10 mg at 03/13/15 0847  . traMADol (ULTRAM) tablet 100 mg  100 mg  Oral TID WC & HS Jimmy Footman, MD   100 mg at 03/13/15 1216  . venlafaxine XR (EFFEXOR-XR) 24 hr capsule 37.5 mg  37.5 mg Oral Q breakfast Jimmy Footman, MD   37.5 mg at 03/13/15 0847  . white petrolatum (VASELINE) gel   Topical PRN Jimmy Footman, MD      . zolpidem Remus Loffler) tablet 5 mg  5 mg Oral QHS Jimmy Footman, MD   5 mg at 03/12/15 2158    Lab Results:  Results for orders placed or performed during the hospital encounter of 03/11/15 (from the past 48 hour(s))  TSH     Status: None   Collection Time:  03/12/15 11:27 AM  Result Value Ref Range   TSH 1.337 0.350 - 4.500 uIU/mL  Vitamin B12     Status: None   Collection Time: 03/12/15 11:27 AM  Result Value Ref Range   Vitamin B-12 288 180 - 914 pg/mL    Comment: (NOTE) This assay is not validated for testing neonatal or myeloproliferative syndrome specimens for Vitamin B12 levels. Performed at [facility]     Physical Findings: AIMS:  , ,  ,  ,    CIWA:    COWS:     Treatment Plan Summary: Daily contact with patient to assess and evaluate symptoms and progress in treatment and Medication management   Medical Decision Making:  Established Problem, Stable/Improving (1), Review of Psycho-Social Stressors (1), Review or order clinical lab tests (1), Review of Medication Regimen & Side Effects (2) and Review of New Medication or Change in Dosage (2)   MDD: Patient reports not currently benefiting from Lexapro therefore this medication will be discontinued. Patient has been is started on Effexor XR 37.5 mg by mouth daily. Will increase to 75 mg.  Insomnia: Continue Ambien 5 mg by mouth daily at bedtime. She slept 4.5 hours only.  Severe shoulder pain: Continue tramadol 100 mg but due to severe pain we will change from 3 times a day to 4 times a day. Continue Lidoderm patch but will increase to 2 patches a day one to shoulder in 1 to lower back. I will add ibuprofen 400 mg  every 6 hours when necessary  UTI: Continue Cipro 500 mg by mouth twice a day for 3 more days  Hypertension: Continue lisinopril 40 mg by mouth daily.  Psoriatic arthritis: Continue prednisone 10 mg by mouth daily and methotrexate 10 mg every weekly (continue folic acid).   Anemia: Patient will be reassessed started on iron supplementation.  Osteoporosis: Continue the patient on calcium  Precautions: Every 15 minute checks  Labs : will order b12 and TSH today.  PT and OT have been consulted.  Discharge disposition: Once stable the patient will be discharged back home. Social worker will attempt to connect the patient with home health services.     Zsazsa Bahena 03/13/2015, 4:23 PM

## 2015-03-14 MED ORDER — TEMAZEPAM 15 MG PO CAPS
15.0000 mg | ORAL_CAPSULE | Freq: Once | ORAL | Status: AC
  Administered 2015-03-15: 15 mg via ORAL
  Filled 2015-03-14: qty 1

## 2015-03-14 NOTE — Progress Notes (Signed)
[facility] MD Progress Note  03/14/2015 1:48 PM Tara Brown  MRN:  [id number]  Subjective:  Ms. Olvera reports further improvement. Her mood is good with full affect. She did not sleep last night as she is used to 10 mg of Ambien and only 5 mg are aloud in our hospital.she is forward thinking and much more optimistic about the future. She seems to have a good plan following discharge. She feels much more positive about the upcoming shoulder surgery. She spoke with her sister last night and has plenty of support from the family. She tolerates medications well. She participates in groups. Her appetite has much improved although she has not been taking her ensure. She feels that it's not cold enough and doesn't like the taste. There are no somatic complaints except for the shoulder pain. She would like to be discharged tomorrow so she can attend an appointment with neurologist on Tuesday. She will contact her family for a ride. She denies any suicidal or homicidal thoughts.  Principal Problem: Major depressive disorder, recurrent, severe without psychotic features Diagnosis:   Patient Active Problem List   Diagnosis Date Noted  . Psoriatic arthritis [L40.50] 03/11/2015  . Osteoporosis [M81.0] 03/11/2015  . Major depressive disorder, recurrent, severe without psychotic features [F33.2] 03/11/2015  . Sepsis secondary to UTI [A41.9, N39.0] 03/08/2015  . Leukopenia [D72.819] 03/08/2015  . Anemia of chronic disease [D63.8] 03/08/2015   Total Time spent with patient: 20 minutes   Past Medical History:  Past Medical History  Diagnosis Date  . Osteoporosis   . Psoriatic arthritis   . Anemia   . Depression     Past Surgical History  Procedure Laterality Date  . Hip fracture surgery    . Knee surgery    . Foot surgery    . Hand surgery    . Neck surgery     Family History:  Family History  Problem Relation Age of Onset  . Stroke Neg Hx    Social History:  History  Alcohol Use No      History  Drug Use No    History   Social History  . Marital Status: Divorced    Spouse Name: N/A  . Number of Children: N/A  . Years of Education: N/A   Social History Main Topics  . Smoking status: Never Smoker   . Smokeless tobacco: Never Used  . Alcohol Use: No  . Drug Use: No  . Sexual Activity: No   Other Topics Concern  . None   Social History Narrative   Additional History:    Sleep: Poor  Appetite:  Fair   Assessment:   Musculoskeletal: Strength & Muscle Tone: within normal limits Gait & Station: normal Patient leans: N/A   Psychiatric Specialty Exam: Physical Exam  Nursing note and vitals reviewed.   Review of Systems  Musculoskeletal: Positive for joint pain.    Blood pressure 109/76, pulse 86, temperature 97.5 F (36.4 C), temperature source Oral, resp. rate 20, height 5\' 6"  (1.676 m), weight 56.7 kg (125 lb), SpO2 96 %.Body mass index is 20.19 kg/(m^2).  General Appearance: Casual  Eye Contact::  Good  Speech:  Clear and Coherent  Volume:  Normal  Mood:  Euthymic  Affect:  Appropriate  Thought Process:  Goal Directed, Linear and Logical  Orientation:  Full (Time, Place, and Person)  Thought Content:  WDL  Suicidal Thoughts:  No  Homicidal Thoughts:  No  Memory:  Immediate;   Fair Recent;   Fair  Remote;   Fair  Judgement:  Fair  Insight:  Fair  Psychomotor Activity:  Normal  Concentration:  Fair  Recall:  [employer] of Knowledge:Fair  Language: Fair  Akathisia:  No  Handed:  Right  AIMS (if indicated):     Assets:  Communication Skills Desire for Improvement Financial Resources/Insurance Housing Resilience Social Support  ADL's:  Intact  Cognition: WNL  Sleep:  Number of Hours: 2.5     Current Medications: Current Facility-Administered Medications  Medication Dose Route Frequency Provider Last Rate Last Dose  . acetaminophen (TYLENOL) tablet 650 mg  650 mg Oral Q6H PRN Jimmy Footman, MD      . alum & mag  hydroxide-simeth (MAALOX/MYLANTA) 200-200-20 MG/5ML suspension 30 mL  30 mL Oral Q4H PRN Jimmy Footman, MD      . calcium carbonate (TUMS - dosed in mg elemental calcium) chewable tablet 1,000 mg of elemental calcium  1,000 mg of elemental calcium Oral Q breakfast Jimmy Footman, MD   1,000 mg of elemental calcium at 03/14/15 0853  . ciprofloxacin (CIPRO) tablet 500 mg  500 mg Oral BID Jimmy Footman, MD   500 mg at 03/14/15 0851  . feeding supplement (ENSURE ENLIVE) (ENSURE ENLIVE) liquid 237 mL  237 mL Oral TID BM Jimmy Footman, MD   237 mL at 03/14/15 1319  . ferrous sulfate tablet 325 mg  325 mg Oral BID WC Jimmy Footman, MD   325 mg at 03/14/15 0851  . folic acid (FOLVITE) tablet 1 mg  1 mg Oral Daily Jimmy Footman, MD   1 mg at 03/14/15 0852  . ibuprofen (ADVIL,MOTRIN) tablet 600 mg  600 mg Oral Q6H PRN Jimmy Footman, MD      . lidocaine (LIDODERM) 5 % 2 patch  2 patch Transdermal Q24H Jimmy Footman, MD   1 patch at 03/13/15 1627  . lisinopril (PRINIVIL,ZESTRIL) tablet 20 mg  20 mg Oral Daily Jimmy Footman, MD   20 mg at 03/14/15 0853  . magnesium hydroxide (MILK OF MAGNESIA) suspension 30 mL  30 mL Oral Daily PRN Jimmy Footman, MD      . methotrexate (RHEUMATREX) tablet 10 mg  10 mg Oral Q Fri Jimmy Footman, MD   10 mg at 03/12/15 1000  . nicotine (NICOTROL) 10 MG inhaler 1 continuous puffing  1 continuous puffing Inhalation PRN Jimmy Footman, MD      . polyethylene glycol (MIRALAX / GLYCOLAX) packet 17 g  17 g Oral Daily PRN Jimmy Footman, MD      . predniSONE (DELTASONE) tablet 10 mg  10 mg Oral Q breakfast Jimmy Footman, MD   10 mg at 03/14/15 5400  . traMADol (ULTRAM) tablet 100 mg  100 mg Oral TID WC & HS Jimmy Footman, MD   100 mg at 03/14/15 0852  . venlafaxine XR (EFFEXOR-XR) 24 hr capsule 75 mg  75 mg Oral Q  breakfast Kyesha Balla B Aerial Dilley, MD   75 mg at 03/14/15 0852  . white petrolatum (VASELINE) gel   Topical PRN Jimmy Footman, MD      . zolpidem (AMBIEN) tablet 5 mg  5 mg Oral QHS Shari Prows, MD   5 mg at 03/13/15 2104    Lab Results: No results found for this or any previous visit (from the past 48 hour(s)).  Physical Findings: AIMS:  , ,  ,  ,    CIWA:    COWS:     Treatment Plan Summary: Daily contact with patient to assess and  evaluate symptoms and progress in treatment and Medication management   Medical Decision Making:  Established Problem, Stable/Improving (1), Review of Psycho-Social Stressors (1), Review or order clinical lab tests (1), Review of Medication Regimen & Side Effects (2) and Review of New Medication or Change in Dosage (2)   MDD: Patient reports not currently benefiting from Lexapro therefore this medication will be discontinued. Patient has been is started on Effexor XR 37.5 mg by mouth daily. Will increase to 75 mg.  Insomnia: Continue Ambien 5 mg by mouth daily at bedtime. She slept 4.5 hours only.  Severe shoulder pain: Continue tramadol 100 mg but due to severe pain we will change from 3 times a day to 4 times a day. Continue Lidoderm patch but will increase to 2 patches a day one to shoulder in 1 to lower back. I will add ibuprofen 400 mg every 6 hours when necessary  UTI: Continue Cipro 500 mg by mouth twice a day for 3 more days  Hypertension: Continue lisinopril 40 mg by mouth daily.  Psoriatic arthritis: Continue prednisone 10 mg by mouth daily and methotrexate 10 mg every weekly (continue folic acid).   Anemia: Patient will be reassessed started on iron supplementation.  Osteoporosis: Continue the patient on calcium  Precautions: Every 15 minute checks  Labs : will order b12 and TSH today.  PT and OT have been consulted.  Discharge disposition: Once stable the patient will be discharged back home. Social worker will  attempt to connect the patient with home health services.  03/14/2015 treatment plan and medication list was reviewed with the patient. No medication changes offered today.     Lashona Schaaf 03/14/2015, 1:48 PM

## 2015-03-14 NOTE — [facility] Group Notes (Signed)
[facility] Group Notes:  (Nursing/MHT/Case Management/Adjunct)  Date:  03/14/2015  Time:  11:49 PM  Type of Therapy:  Group Therapy  Participation Level:  None  Participation Quality:  no participation  Affect:  Appropriate  Cognitive:  Appropriate  Insight:  Appropriate  Engagement in Group:  None  Modes of Intervention:  Discussion  Summary of Progress/Problems:  Tara Brown Tara Brown 03/14/2015, 11:49 PM

## 2015-03-14 NOTE — Progress Notes (Signed)
Pt visible on the unit. Attended outside group. Denies suicidal thoughts at this time. Mood less depressed. Med compliant.

## 2015-03-14 NOTE — [facility] Group Notes (Signed)
[facility] LCSW Group Therapy  03/14/2015 3:29 PM  Type of Therapy:  Group Therapy  Participation Level:  Active  Participation Quality:  Appropriate and Attentive  Affect:  Appropriate  Cognitive:  Alert and Appropriate  Insight:  Engaged  Engagement in Therapy:  Engaged  Modes of Intervention:  Discussion, Education, Role-play, Socialization and Support  Summary of Progress/Problems:Positive thoughts: Pt was encouraged to identify distorted thoughts. They will discuss the emotions and behaviors influenced by these thoughts. Pt will discuss ways to recognize distorted thoughts and how to refute them. Lynnley identified "Nothing is ever going to get better" as her negative thought. When she has this thought, she becomes depressed and isolates herself. During group she shared positive things about her life to refute this thought. She was very supportive of other group members.   Kyesha Balla L Mukhtar Shams MSW, LCSWA  03/14/2015, 3:29 PM

## 2015-03-14 NOTE — Progress Notes (Signed)
Patient has been calm and cooperative with unit routine. Compliant with medications. Has not exhibited any inappropriate behaviors. Denies si/hi or avh. Reports that she is feeling much better. No falls or injuries noted. Will cont to monitor and support.

## 2015-03-15 LAB — CREATININE, SERUM
CREATININE: 1 mg/dL (ref 0.44–1.00)
GFR calc non Af Amer: >60 mL/min (ref >60)

## 2015-03-15 MED ORDER — TEMAZEPAM 15 MG PO CAPS
30.0000 mg | ORAL_CAPSULE | Freq: Every evening | ORAL | Status: DC | PRN
Start: 2015-03-15 — End: 2015-03-16
  Administered 2015-03-15: 30 mg via ORAL
  Filled 2015-03-15: qty 2

## 2015-03-15 MED ORDER — VENLAFAXINE HCL ER 75 MG PO CP24
150.0000 mg | ORAL_CAPSULE | Freq: Every day | ORAL | Status: DC
  Administered 2015-03-16: 150 mg via ORAL
  Filled 2015-03-15: qty 2

## 2015-03-15 NOTE — Progress Notes (Signed)
Patient alert and oriented. Visible in milieu and has been  Observed playing cards and presents with a bright affect. She states that she is feeling much better and is looking forward to going home tomorrow.  Denies suicidal or homicidal thoughts.  Will cont to monitor for safety.

## 2015-03-15 NOTE — [facility] Group Notes (Signed)
[facility] LCSW Group Therapy  03/15/2015 1:56 PM  Type of Therapy:  Group Therapy  Participation Level:  Minimal  Participation Quality:  Appropriate and Attentive  Affect:  Appropriate  Cognitive:  Alert, Appropriate and Oriented  Insight:  Engaged  Engagement in Therapy:  Improving  Modes of Intervention:  Education, Socialization and Support  Summary of Progress/Problems:  Patient attended and participated in group discussion minimally. Patient shared that her best Independence Day Celebration memory is "my sister has a big block party and the neighborhood hires guys to do the fireworks and entertain".   Lulu Riding, MSW, LCSWA 03/15/2015, 1:56 PM

## 2015-03-15 NOTE — Progress Notes (Signed)
Pt visible on the unit. Denies suicidal thoughts . Feeling more hopeful. Attended group. Med compliant. Pt unable to sleep. Dr. Jennet Maduro notified orders rec'd. Restoril 15 mg po given as ordered.

## 2015-03-15 NOTE — Progress Notes (Signed)
Pt unable to sleep after taking restoril. Pt states she takes ambien 10 mg at home and it helps her sleep.

## 2015-03-15 NOTE — Progress Notes (Signed)
[facility] MD Progress Note  03/15/2015 1:41 PM Tara Brown  MRN:  [id number]  Subjective:  Tara Brown reports further improvement. She believes that symptoms of depression have resolved. He is not suicidal or homicidal.. There is no excessive anxiety. She accepts medications and tolerates them well. Insomnia remains a problem. At home she takes 10 mg Ambien with excellent results. She is only allowed 5 mg in the hospital and has not been able to sleep. We will substitute Ambien with Restoril tonight. At the patient will likely be discharged tomorrow. Her sister will come to pick her up from [facility]. The patient has neurology appointment at 2:00 on Tuesday. Her appetite is adequate but she refuses to drink ensure. Very good group participation.  Principal Problem: Major depressive disorder, recurrent, severe without psychotic features Diagnosis:   Patient Active Problem List   Diagnosis Date Noted  . Psoriatic arthritis [L40.50] 03/11/2015  . Osteoporosis [M81.0] 03/11/2015  . Major depressive disorder, recurrent, severe without psychotic features [F33.2] 03/11/2015  . Sepsis secondary to UTI [A41.9, N39.0] 03/08/2015  . Leukopenia [D72.819] 03/08/2015  . Anemia of chronic disease [D63.8] 03/08/2015   Total Time spent with patient: 20 minutes   Past Medical History:  Past Medical History  Diagnosis Date  . Osteoporosis   . Psoriatic arthritis   . Anemia   . Depression     Past Surgical History  Procedure Laterality Date  . Hip fracture surgery    . Knee surgery    . Foot surgery    . Hand surgery    . Neck surgery     Family History:  Family History  Problem Relation Age of Onset  . Stroke Neg Hx    Social History:  History  Alcohol Use No     History  Drug Use No    History   Social History  . Marital Status: Divorced    Spouse Name: N/A  . Number of Children: N/A  . Years of Education: N/A   Social History Main Topics  . Smoking status: Never Smoker   .  Smokeless tobacco: Never Used  . Alcohol Use: No  . Drug Use: No  . Sexual Activity: No   Other Topics Concern  . None   Social History Narrative   Additional History:    Sleep: Poor  Appetite:  Fair   Assessment:   Musculoskeletal: Strength & Muscle Tone: within normal limits Gait & Station: normal Patient leans: N/A   Psychiatric Specialty Exam: Physical Exam  Nursing note and vitals reviewed.   Review of Systems  Musculoskeletal: Positive for joint pain.  All other systems reviewed and are negative.   Blood pressure 131/92, pulse 70, temperature 98 F (36.7 C), temperature source Oral, resp. rate 20, height 5' 6"  (1.676 m), weight 56.7 kg (125 lb), SpO2 96 %.Body mass index is 20.19 kg/(m^2).  General Appearance: Casual  Eye Contact::  Good  Speech:  Clear and Coherent  Volume:  Normal  Mood:  Euthymic  Affect:  Appropriate  Thought Process:  Goal Directed  Orientation:  Full (Time, Place, and Person)  Thought Content:  WDL  Suicidal Thoughts:  No  Homicidal Thoughts:  No  Memory:  Immediate;   Fair Recent;   Fair Remote;   Fair  Judgement:  Fair  Insight:  Fair  Psychomotor Activity:  Normal  Concentration:  Fair  Recall:  [employer] of Knowledge:Fair  Language: Fair  Akathisia:  No  Handed:  Right  AIMS (if indicated):     Assets:  Communication Skills Desire for Improvement Financial Resources/Insurance Housing Resilience Social Support  ADL's:  Intact  Cognition: WNL  Sleep:  Number of Hours: 0.25     Current Medications: Current Facility-Administered Medications  Medication Dose Route Frequency Provider Last Rate Last Dose  . acetaminophen (TYLENOL) tablet 650 mg  650 mg Oral Q6H PRN Hildred Priest, MD      . alum & mag hydroxide-simeth (MAALOX/MYLANTA) 200-200-20 MG/5ML suspension 30 mL  30 mL Oral Q4H PRN Hildred Priest, MD      . calcium carbonate (TUMS - dosed in mg elemental calcium) chewable tablet 1,000  mg of elemental calcium  1,000 mg of elemental calcium Oral Q breakfast Hildred Priest, MD   1,000 mg of elemental calcium at 03/15/15 0945  . ciprofloxacin (CIPRO) tablet 500 mg  500 mg Oral BID Hildred Priest, MD   500 mg at 03/15/15 0945  . feeding supplement (ENSURE ENLIVE) (ENSURE ENLIVE) liquid 237 mL  237 mL Oral TID BM Hildred Priest, MD   237 mL at 03/15/15 1307  . ferrous sulfate tablet 325 mg  325 mg Oral BID WC Hildred Priest, MD   325 mg at 03/15/15 0947  . folic acid (FOLVITE) tablet 1 mg  1 mg Oral Daily Hildred Priest, MD   1 mg at 03/15/15 0946  . ibuprofen (ADVIL,MOTRIN) tablet 600 mg  600 mg Oral Q6H PRN Hildred Priest, MD      . lidocaine (LIDODERM) 5 % 2 patch  2 patch Transdermal Q24H Hildred Priest, MD   2 patch at 03/14/15 1652  . lisinopril (PRINIVIL,ZESTRIL) tablet 20 mg  20 mg Oral Daily Hildred Priest, MD   20 mg at 03/15/15 0947  . magnesium hydroxide (MILK OF MAGNESIA) suspension 30 mL  30 mL Oral Daily PRN Hildred Priest, MD      . methotrexate (RHEUMATREX) tablet 10 mg  10 mg Oral Q Fri Hildred Priest, MD   10 mg at 03/12/15 1000  . nicotine (NICOTROL) 10 MG inhaler 1 continuous puffing  1 continuous puffing Inhalation PRN Hildred Priest, MD      . polyethylene glycol (MIRALAX / GLYCOLAX) packet 17 g  17 g Oral Daily PRN Hildred Priest, MD      . predniSONE (DELTASONE) tablet 10 mg  10 mg Oral Q breakfast Hildred Priest, MD   10 mg at 03/15/15 0946  . traMADol (ULTRAM) tablet 100 mg  100 mg Oral TID WC & HS Hildred Priest, MD   100 mg at 03/15/15 1307  . venlafaxine XR (EFFEXOR-XR) 24 hr capsule 75 mg  75 mg Oral Q breakfast Jamariyah Johannsen B Lyal Husted, MD   75 mg at 03/15/15 0948  . white petrolatum (VASELINE) gel   Topical PRN Hildred Priest, MD      . zolpidem (AMBIEN) tablet 5 mg  5 mg Oral QHS Clovis Fredrickson, MD   5 mg at 03/14/15 2110    Lab Results:  Results for orders placed or performed during the hospital encounter of 03/11/15 (from the past 48 hour(s))  Creatinine, serum     Status: None   Collection Time: 03/15/15  6:12 AM  Result Value Ref Range   Creatinine, Ser 1.00 0.44 - 1.00 mg/dL   GFR calc non Af Amer >60 >60 mL/min   GFR calc Af Amer >60 >60 mL/min    Comment: (NOTE) The eGFR has been calculated using the CKD EPI equation. This calculation has not been  validated in all clinical situations. eGFR's persistently <60 mL/min signify possible Chronic Kidney Disease.     Physical Findings: AIMS:  , ,  ,  ,    CIWA:    COWS:     Treatment Plan Summary: Daily contact with patient to assess and evaluate symptoms and progress in treatment and Medication management   Medical Decision Making:  Established Problem, Stable/Improving (1), Review of Psycho-Social Stressors (1), Review or order clinical lab tests (1), Review of Medication Regimen & Side Effects (2) and Review of New Medication or Change in Dosage (2)   MDD: Patient reports not currently benefiting from Lexapro therefore this medication will be discontinued. Patient has been is started on Effexor XR 37.5 mg by mouth daily. Will increase to 150 mg.  Insomnia: We discontinue Ambien and offer Restoril tonight.   Severe shoulder pain: Continue tramadol 100 mg but due to severe pain we will change from 3 times a day to 4 times a day. Continue Lidoderm patch but will increase to 2 patches a day one to shoulder in 1 to lower back. I will add ibuprofen 400 mg every 6 hours when necessary  UTI: Continue Cipro 500 mg by mouth twice a day for 3 more days  Hypertension: Continue lisinopril 40 mg by mouth daily.  Psoriatic arthritis: Continue prednisone 10 mg by mouth daily and methotrexate 10 mg every weekly (continue folic acid).   Anemia: Patient will be reassessed started on iron  supplementation.  Osteoporosis: Continue the patient on calcium  Precautions: Every 15 minute checks  Labs : will order b12 and TSH today.  PT and OT have been consulted.  Discharge disposition: Once stable the patient will be discharged back home. Social worker will attempt to connect the patient with home health services.  03/14/2015 treatment plan and medication list was reviewed with the patient. Anticipated discharge tomorrow.      Juleen Sorrels 03/15/2015, 1:41 PM

## 2015-03-15 NOTE — [facility] Group Notes (Signed)
[facility] Group Notes:  (Nursing/MHT/Case Management/Adjunct)  Date:  03/15/2015  Time:  10:18 PM  Type of Therapy:  Psychoeducational Skills  Participation Level:  Active  Participation Quality:  Appropriate, Attentive and Sharing  Affect:  Appropriate  Cognitive:  Alert, Appropriate and Oriented  Insight:  Good  Engagement in Group:  Engaged  Modes of Intervention:  Discussion and Exploration  Summary of Progress/Problems:  Tara Brown 03/15/2015, 10:18 PM

## 2015-03-16 MED ORDER — LISINOPRIL 20 MG PO TABS: 20.0000 mg | ORAL_TABLET | Freq: Every day | ORAL | Status: DC

## 2015-03-16 MED ORDER — ZOLPIDEM TARTRATE 5 MG PO TABS: 10.0000 mg | ORAL_TABLET | Freq: Every evening | ORAL | Status: DC | PRN

## 2015-03-16 MED ORDER — LIDOCAINE HCL 4 % EX SOLN: Freq: Every day | CUTANEOUS | Status: DC

## 2015-03-16 MED ORDER — CALCIUM CARBONATE ANTACID 500 MG PO CHEW: 1.0000 | CHEWABLE_TABLET | Freq: Every day | ORAL | Status: DC

## 2015-03-16 MED ORDER — VENLAFAXINE HCL ER 150 MG PO CP24: 150.0000 mg | ORAL_CAPSULE | Freq: Every day | ORAL | Status: DC

## 2015-03-16 MED ORDER — ZOLPIDEM TARTRATE 10 MG PO TABS: 10.0000 mg | ORAL_TABLET | Freq: Every evening | ORAL | Status: DC | PRN

## 2015-03-16 MED ORDER — FERROUS SULFATE 325 (65 FE) MG PO TABS
325.0000 mg | ORAL_TABLET | Freq: Two times a day (BID) | ORAL | Status: DC
Start: 2015-03-16 — End: 2015-04-01

## 2015-03-16 MED ORDER — TRAMADOL HCL 50 MG PO TABS: 100.0000 mg | ORAL_TABLET | Freq: Three times a day (TID) | ORAL | Status: DC

## 2015-03-16 MED ORDER — ZOLPIDEM TARTRATE 5 MG PO TABS: 5.0000 mg | ORAL_TABLET | Freq: Every evening | ORAL | Status: DC | PRN

## 2015-03-16 NOTE — [facility] Suicide Risk Assessment (Signed)
[facility] Discharge Suicide Risk Assessment   Demographic Factors:  Caucasian and Living alone  Total Time spent with patient: 30 minutes  Musculoskeletal: Strength & Muscle Tone: spastic Gait & Station: normal Patient leans: N/A  Psychiatric Specialty Exam: Physical Exam  ROS  Blood pressure 104/68, pulse 83, temperature 97.8 F (36.6 C), temperature source Oral, resp. rate 20, height 5\' 6"  (1.676 m), weight 56.7 kg (125 lb), SpO2 96 %.Body mass index is 20.19 kg/(m^2).  General Appearance: Well Groomed  ::  Good  Speech:  Clear and Coherent409  Volume:  Normal  Mood:  Euthymic  Affect:  Constricted  Thought Process:  Linear and Logical  Orientation:  Full (Time, Place, and Person)  Thought Content:  Hallucinations: None  Suicidal Thoughts:  No  Homicidal Thoughts:  No  Memory:  Immediate;   Good Recent;   Good Remote;   Good  Judgement:  Good  Insight:  Good  Psychomotor Activity:  Normal  Concentration:  Good  Recall:  NA  Fund of Knowledge:Good  Language: Good  Akathisia:  No  Handed:    AIMS (if indicated):     Assets:  Communication Skills Desire for Improvement Financial Resources/Insurance Housing Intimacy Social Support Vocational/Educational  Sleep:  Number of Hours: 1  Cognition: WNL  ADL's:  Intact   Have you used any form of tobacco in the last 30 days? (Cigarettes, Smokeless Tobacco, Cigars, and/or Pipes): No  Has this patient used any form of tobacco in the last 30 days? (Cigarettes, Smokeless Tobacco, Cigars, and/or Pipes) No  Mental Status Per Nursing Assessment::   On Admission:  Suicidal ideation indicated by patient  Current Mental Status by Physician: denies SI, HI or A/V. Mooe euthymic, affect is brighter. Attending groups and participating.  Tolerating well meds.  Supportive family  Loss Factors: Decline in physical health  Historical Factors: Family history of suicide  Risk Reduction Factors:   Sense of responsibility  to family and Positive social support  Continued Clinical Symptoms:  Depression:   Impulsivity  Cognitive Features That Contribute To Risk:  None    Suicide Risk:  Minimal: No identifiable suicidal ideation.  Patients presenting with no risk factors but with morbid ruminations; may be classified as minimal risk based on the severity of the depressive symptoms  Principal Problem: Major depressive disorder, recurrent, severe without psychotic features Discharge Diagnoses:  Patient Active Problem List   Diagnosis Date Noted  . Psoriatic arthritis [L40.50] 03/11/2015  . Osteoporosis [M81.0] 03/11/2015  . Major depressive disorder, recurrent, severe without psychotic features [F33.2] 03/11/2015  . Sepsis secondary to UTI [A41.9, N39.0] 03/08/2015  . Leukopenia [D72.819] 03/08/2015  . Anemia of chronic disease [D63.8] 03/08/2015      Plan Of Care/Follow-up recommendations:  Other:  f/u with outpt psychiatry  Is patient on multiple antipsychotic therapies at discharge:  No   Has Patient had three or more failed trials of antipsychotic monotherapy by history:  No  Recommended Plan for Multiple Antipsychotic Therapies: NA    03/10/2015 03/16/2015, 10:41 AM

## 2015-03-16 NOTE — Plan of Care (Signed)
Problem: Ineffective individual coping Goal: LTG: Patient will report a decrease in negative feelings Outcome: Progressing Patient states he has less negative feelings and is prepared to be discharged.  Goal: STG: Patient will remain free from self harm Outcome: Progressing No self-harm.

## 2015-03-16 NOTE — Progress Notes (Signed)
[facility] H&P Discharge Summary faxed to [facility] for hospital follow-up

## 2015-03-16 NOTE — Discharge Summary (Signed)
Physician Discharge Summary Note  Patient:  Tara Brown is an 54 y.o., female MRN:  [id number] DOB:  [birth year] Patient phone:  [phone] (home)  Patient address:   [address] [place] [address],  Total Time spent with patient: 30 minutes  Date of Admission:  03/11/2015 Date of Discharge: 03/16/2015  Reason for Admission:  Depression and SI  Principal Problem: Major depressive disorder, recurrent, severe without psychotic features Discharge Diagnoses: Patient Active Problem List   Diagnosis Date Noted  . Psoriatic arthritis [L40.50] 03/11/2015  . Osteoporosis [M81.0] 03/11/2015  . Major depressive disorder, recurrent, severe without psychotic features [F33.2] 03/11/2015  . Sepsis secondary to UTI [A41.9, N39.0] 03/08/2015  . Leukopenia [D72.819] 03/08/2015  . Anemia of chronic disease [D63.8] 03/08/2015    Musculoskeletal: Strength & Muscle Tone: spastic Gait & Station: normal Patient leans: N/A  Psychiatric Specialty Exam: Physical Exam  Review of Systems  Constitutional: Negative.   HENT: Negative.   Eyes: Negative.   Respiratory: Negative.   Cardiovascular: Negative.   Gastrointestinal: Negative.   Genitourinary: Negative.   Musculoskeletal: Positive for back pain and joint pain.  Skin: Negative.   Neurological: Negative.   Endo/Heme/Allergies: Negative.   Psychiatric/Behavioral: Negative.     Blood pressure 104/68, pulse 83, temperature 97.8 F (36.6 C), temperature source Oral, resp. rate 20, height _0  (1.676 m), weight 56.7 kg (125 lb), SpO2 96 %.Body mass index is 20.19 kg/(m^2).  General Appearance: Well Groomed  Engineer, water::  Good  Speech:  Clear and Coherent and Normal Rate  Volume:  Normal  Mood:  Euthymic  Affect:  Appropriate and Congruent  Thought Process:  Linear and Logical  Orientation:  Full (Time, Place, and Person)  Thought Content:  Hallucinations: None  Suicidal Thoughts:  No  Homicidal Thoughts:  No  Memory:   Immediate;   Good Recent;   Good Remote;   Good  Judgement:  Good  Insight:  Good  Psychomotor Activity:  Normal  Concentration:  Good  Recall:  NA  Fund of Knowledge:Good  Language: Good  Akathisia:  No  Handed:    AIMS (if indicated):     Assets:  Communication Skills Desire for Improvement Financial Resources/Insurance Housing Social Support Talents/Skills Vocational/Educational  ADL's:  Intact  Cognition: WNL  Sleep:  Number of Hours: 1   Have you used any form of tobacco in the last 30 days? (Cigarettes, Smokeless Tobacco, Cigars, and/or Pipes): No  Has this patient used any form of tobacco in the last 30 days? (Cigarettes, Smokeless Tobacco, Cigars, and/or Pipes) No  History of Present Illness:  The patient is a 54 year old divorced Caucasian female from [place]. This patient has a history of depression and severe psoriatic arthritis. Patient was referred for psychiatric admission by [facility]. The patient was admitted there earlier this week due to UTI and sepsis. Per notes the patient was found in her apartment by her sisters very weak and dehydrated therefore she was taking to the emergency department in [place]. Patient reported that she had not been taking her medications and had not been eating or drinking for a few days due to severe depression. As patient voiced suicidal ideation psychiatry got consulted.  While at [facility] patient reported thoughts of wanting to hang herself or overdose on her medications.  Patient reports a long history of depression for which she has been receiving care by her primary care provider with Lexapro. The patient has been taking Lexapro 20  mg for about 5 years with good response. She explains that 2 months ago she started having severe right shoulder pain. The pain has limited significantly her ability to perform ADLs as she is right-handed. In addition her left arm has been affected by psoriatic  arthritis and in a past fracture. Patient complains of not being able to watch or brushing her hair, she is unable to drive however she sees she forces herself to drive short distances but she is aware that she should not be driving as she cannot lift her arms to grab the wheel. Patient has family around her but she does not want to live in her independence and become a burden for her relatives. Patient currently only has Medicare which is barely enough to cover her for rent and other bills. She states she has a friend who works at [employer] and has told her she does not qualify for any additional services at this time.  Substance abuse history: Patient denies any history of alcohol or drug use patient denies abusing any nicotine products.  HPI Elements:  Severity: sevre Timing: chronic with acute exacerbation Duration: 2 months Context: physical limitation, inability to perform ADL, worsening pain  Past psychiatry History: This patient had behavior health in [place] in 2009 for depression. Patient states she was hospitalized there after her mother passed away. She was kept in the hospital for about 5 days. She denies any history of suicidal attempts or self-injurious behaviors. Patient reports taking Lexapro prescribed by her primary care provider for about 5 years. In the past she tried Prozac and citalopram and she does not remember what her reaction was to these 2 agents.  Past Medical History: Severe osteoporosis, psoriatic arthritis, anemia of chronic illness. Patient has had multiple surgical repair of fractures on hip, knee, foot, hand and neck. Currently she is awaiting conduction status he on her arm due to severe shoulder pain. This is that he Brown determine whether or not this injury is surgical. Patient just discharged from [facility], due to UTI and sepsis. Past Medical History  Diagnosis Date  . Osteoporosis   . Psoriatic arthritis   . Anemia   .  Depression     Past Surgical History  Procedure Laterality Date  . Hip fracture surgery    . Knee surgery    . Foot surgery    . Hand surgery    . Neck surgery     Family History: Patient has a nephew who committed suicide at age 43 about 2 years ago. His nephew was diagnosed with schizophrenia. The patient also reports having a means (status sister of the nephew who committed suicide) who has attempted suicide as well. Her mother died of bone and breast cancer. Family History  Problem Relation Age of Onset  . Stroke Neg Hx    Social History: Patient has been divorced since 2009. She does not have any children. 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 She is currently on disability due to her chronic medical conditions.. Patient receives $1140/m on disability. Patient has 3 sisters, 2 sisters live in [place] one of them in [place].  History  Alcohol Use No    History  Drug Use No    History   Social History  . Marital Status: Divorced    Spouse Name: N/A  . Number of Children: N/A  . Years of Education: N/A  Social History Main Topics  . Smoking status: Never Smoker   . Smokeless tobacco: Never Used  . Alcohol Use: No  . Drug Use: No  . Sexual Activity: No   Other Topics Concern  . None         Hospital Course:    MDD: lexapro was d/c due to lack of benefit. She was started on effexor XR. Dose was titrated up to 150 mg po q day which she tolerated well  Insomnia: Continue Ambien 10 mg po qhs prn  Severe shoulder pain: Continue tramadol 100 mg but due to severe pain we Brown change from 3 times a day to 4 times a day. Continue Lidoderm patch but Brown increase to 2 patches a day one to shoulder in 1 to lower back.   UTI: completed cipro  Hypertension: Continue lisinopril 20 mg by mouth  daily.  Psoriatic arthritis: Continue prednisone 10 mg by mouth daily and methotrexate 10 mg every weekly (continue folic acid).   Anemia: Patient Brown be reassessed started on iron supplementation.  Osteoporosis: Continue the patient on calcium  On the day of the discharge the patient appeared much improved. She was no longer hopeless or helpless. Her mood was euthymic 8 year affect was reactive and brighter. During her stay the patient participated in groups and felt that they were beneficial. At the time of the discharge she denied any thoughts of suicide, homicide or having any psychotic symptoms. Patient has a supportive family that is involving her care.  No prior history of suicidal attempts. No access to lethal weapons. Patient has a family member who committed suicide and has reported that she Brown never make her family go through the same pain they went trough with her nephew.  Consults:  OT "General ADL Comments: Patient reports difficulty with opening jars and cans, door knobs, stove knobs, washing and combing hair. Suggested builty up handles, adaptable knobs, extended sponge and comb/brush, Patient has a catalog at home and reports she Brown look for those things."  Significant Diagnostic Studies:  labs: see below  Discharge Vitals:   Blood pressure 104/68, pulse 83, temperature 97.8 F (36.6 C), temperature source Oral, resp. rate 20, height _0  (1.676 m), weight 56.7 kg (125 lb), SpO2 96 %. Body mass index is 20.19 kg/(m^2).  Lab Results:    Results for NYESHIA, MYSLIWIEC (MRN [id number]) as of 03/16/2015 12:04  Ref. Range 03/08/2015 03:50 03/09/2015 04:34 03/11/2015 04:33 03/12/2015 11:27 03/15/2015 06:12  Sodium Latest Ref Range: 135-145 mmol/L 137      Potassium Latest Ref Range: 3.5-5.1 mmol/L 4.0      Chloride Latest Ref Range: 101-111 mmol/L 102      CO2 Latest Ref Range: 22-32 mmol/L 24      BUN Latest Ref Range: 6-20 mg/dL 8      Creatinine Latest Ref Range: 0.44-1.00 mg/dL  0.96    1.00  Calcium Latest Ref Range: 8.9-10.3 mg/dL 8.3 (L)      EGFR (Non-African Amer.) Latest Ref Range: >60 mL/min >60    >60  EGFR (African American) Latest Ref Range: >60 mL/min >60    >60  Glucose Latest Ref Range: 65-99 mg/dL 157 (H)      Anion gap Latest Ref Range: 5-15  11      Alkaline Phosphatase Latest Ref Range: 38-126 U/L 74      Albumin Latest Ref Range: 3.5-5.0 g/dL 2.9 (L)      AST Latest Ref Range: 15-41 U/L 12 (L)  ALT Latest Ref Range: 14-54 U/L 5 (L)      Total Protein Latest Ref Range: 6.5-8.1 g/dL 7.3      Total Bilirubin Latest Ref Range: 0.3-1.2 mg/dL 0.6      Procalcitonin Latest Units: ng/mL  0.26 <0.10    Vitamin B-12 Latest Ref Range: 180-914 pg/mL    288   WBC Latest Ref Range: 4.0-10.5 K/uL 2.6 (L)      RBC Latest Ref Range: 3.87-5.11 MIL/uL 3.11 (L)      Hemoglobin Latest Ref Range: 12.0-15.0 g/dL 8.1 (L)      HCT Latest Ref Range: 36.0-46.0 % 26.1 (L)      MCV Latest Ref Range: 78.0-100.0 fL 83.9      MCH Latest Ref Range: 26.0-34.0 pg 26.0      MCHC Latest Ref Range: 30.0-36.0 g/dL 31.0      RDW Latest Ref Range: 11.5-15.5 % 17.3 (H)      Platelets Latest Ref Range: 150-400 K/uL 468 (H)      Neutrophils Latest Ref Range: 43-77 % 71      Lymphocytes Latest Ref Range: 12-46 % 25      Monocytes Relative Latest Ref Range: 3-12 % 4      Eosinophil Latest Ref Range: 0-5 % 0      Basophil Latest Ref Range: 0-1 % 0      NEUT# Latest Ref Range: 1.7-7.7 K/uL 1.8      Lymphocyte # Latest Ref Range: 0.7-4.0 K/uL 0.7      Monocyte # Latest Ref Range: 0.1-1.0 K/uL 0.1      Eosinophils Absolute Latest Ref Range: 0.0-0.7 K/uL 0.0      Basophils Absolute Latest Ref Range: 0.0-0.1 K/uL 0.0      TSH Latest Ref Range: 0.350-4.500 uIU/mL    1.337    See Psychiatric Specialty Exam and Suicide Risk Assessment completed by Attending Physician prior to discharge.  Discharge destination:  Home  Is patient on multiple antipsychotic therapies at discharge:  No    Has Patient had three or more failed trials of antipsychotic monotherapy by history:  No    Recommended Plan for Multiple Antipsychotic Therapies: NA  Discharge Instructions    Diet - low sodium heart healthy    Complete by:  As directed      Diet - low sodium heart healthy    Complete by:  As directed      Increase activity slowly    Complete by:  As directed             Medication List    STOP taking these medications        ciprofloxacin 500 MG tablet  Commonly known as:  CIPRO     ENBREL [place]     escitalopram 20 MG tablet  Commonly known as:  LEXAPRO      TAKE these medications      Indication   calcium carbonate 500 MG chewable tablet  Commonly known as:  TUMS - dosed in mg elemental calcium  Chew 1 tablet (200 mg of elemental calcium total) by mouth daily.  Notes to Patient:  Low calcium      ferrous sulfate 325 (65 FE) MG tablet  Take 1 tablet (325 mg total) by mouth 2 (two) times daily with a meal.  Notes to Patient:  anemia      folic acid 1 MG tablet  Commonly known as:  FOLVITE  Take 1 mg by mouth daily.  Notes to  Patient:  Nutritional supplement      lidocaine 4 % external solution  Commonly known as:  XYLOCAINE  Apply topically daily. Apply to shoulder and lower back  Notes to Patient:  Pain       lisinopril 20 MG tablet  Commonly known as:  PRINIVIL,ZESTRIL  Take 1 tablet (20 mg total) by mouth daily.  Notes to Patient:  Blood pressure      methotrexate 2.5 MG tablet  Commonly known as:  RHEUMATREX  Take 10 mg by mouth once a week. Caution:Chemotherapy. Protect from light.  Notes to Patient:  arthritis      predniSONE 10 MG tablet  Commonly known as:  DELTASONE  Take 10 mg by mouth daily with breakfast.  Notes to Patient:  arthritis      PRILOSEC PO  Take by mouth.  Notes to Patient:  GERD      traMADol 50 MG tablet  Commonly known as:  ULTRAM  Take 2 tablets (100 mg total) by mouth 4 (four) times daily -  with meals and at  bedtime.  Notes to Patient:  pain      venlafaxine XR 150 MG 24 hr capsule  Commonly known as:  EFFEXOR-XR  Take 1 capsule (150 mg total) by mouth daily with breakfast.  Notes to Patient:  depression   Indication:  Major Depressive Disorder     zolpidem 10 MG tablet  Commonly known as:  AMBIEN  Take 1 tablet (10 mg total) by mouth at bedtime as needed for sleep.  Notes to Patient:  insomnia            Follow-up Information    Schedule an appointment as soon as possible for a visit with [facility].   Why:  Unable to get appointment for Hospital Follow up, Outpatient Medication Management, Therapy prior to discharge.  Please follow up and schedule with them within the next 2 weeks.   Contact information:   [place] or [phone]       Follow-up recommendations:  Other:  f/u with oupt psychiatry  Comments:    Total Discharge Time: 30 minutes  Signed: Hildred Priest 03/16/2015, 12:01 PM

## 2015-03-16 NOTE — Progress Notes (Signed)
Patient is pleasant and cooperative. She notes her only concern as her insomnia. She states she slept well when she had Ambien. Despite an increase in dosage the patient has been unable to successfully sleep on the Remeron. She noted no other distress or needs and notes she is looking forward to going home. She denies current SI, HI, and AVH.

## 2015-03-16 NOTE — [facility] Suicide Risk Assessment (Signed)
[facility] INPATIENT:  Family/Significant Other Suicide Prevention Education  Suicide Prevention Education:  Education Completed; Danielle, Pt's sister,  (name of family member/significant other) has been identified by the patient as the family member/significant other with whom the patient will be residing, and identified as the person(s) who will aid the patient in the event of a mental health crisis (suicidal ideations/suicide attempt).  With written consent from the patient, the family member/significant other has been provided the following suicide prevention education, prior to the and/or following the discharge of the patient.  The suicide prevention education provided includes the following:  Suicide risk factors  Suicide prevention and interventions  National Suicide Hotline telephone number  [facility] assessment telephone number  [facility] Emergency Assistance 911  [facility] and/or Residential Mobile Crisis Unit telephone number  Request made of family/significant other to:  Remove weapons (e.g., guns, rifles, knives), all items previously/currently identified as safety concern.    Remove drugs/medications (over-the-counter, prescriptions, illicit drugs), all items previously/currently identified as a safety concern.  The family member/significant other verbalizes understanding of the suicide prevention education information provided.  The family member/significant other agrees to remove the items of safety concern listed above.  Glennon Mac, MSW, LCSW 03/16/2015, 11:51 AM

## 2015-03-16 NOTE — Progress Notes (Signed)
Pt discharged home. DC instructions provided and explained. Medications reviewed. Rx given. All questions answered. Pt stable at discharged. Denies SI, HI, AVH.

## 2015-03-16 NOTE — [facility] Group Notes (Signed)
[facility] Group Notes:  (Nursing/MHT/Case Management/Adjunct)  Date:  03/16/2015  Time:  12:08 PM  Type of Therapy:  Psychoeducational Skills  Participation Level:  Active  Participation Quality:  Appropriate  Affect:  Appropriate  Cognitive:  Appropriate  Insight:  Appropriate  Engagement in Group:  Engaged and Improving  Modes of Intervention:  Discussion and Education  Summary of Progress/Problems:  Mickey Farber 03/16/2015, 12:08 PM

## 2015-03-26 ENCOUNTER — Encounter: Payer: Self-pay | Admitting: *Deleted

## 2015-03-26 ENCOUNTER — Emergency Department
Admission: EM | Admit: 2015-03-26 | Discharge: 2015-03-26 | Disposition: A | Discharge status: Other Acute Inpatient Hospital | Payer: Medicare Other | Attending: Emergency Medicine | Admitting: Emergency Medicine

## 2015-03-26 ENCOUNTER — Inpatient Hospital Stay
Admission: EM | Admit: 2015-03-26 | Discharge: 2015-04-01 | DRG: 885 | Disposition: A | Discharge status: Home / Self Care | Payer: Medicare Other | Source: Intra-hospital | Attending: Psychiatry | Admitting: Psychiatry

## 2015-03-26 DIAGNOSIS — Z9889 Other specified postprocedural states: Secondary | ICD-10-CM

## 2015-03-26 DIAGNOSIS — F332 Major depressive disorder, recurrent severe without psychotic features: Principal | ICD-10-CM | POA: Diagnosis present

## 2015-03-26 DIAGNOSIS — Z823 Family history of stroke: Secondary | ICD-10-CM | POA: Diagnosis not present

## 2015-03-26 DIAGNOSIS — D638 Anemia in other chronic diseases classified elsewhere: Secondary | ICD-10-CM | POA: Diagnosis present

## 2015-03-26 DIAGNOSIS — F329 Major depressive disorder, single episode, unspecified: Secondary | ICD-10-CM | POA: Diagnosis present

## 2015-03-26 DIAGNOSIS — Z79899 Other long term (current) drug therapy: Secondary | ICD-10-CM | POA: Diagnosis not present

## 2015-03-26 DIAGNOSIS — D72819 Decreased white blood cell count, unspecified: Secondary | ICD-10-CM | POA: Diagnosis present

## 2015-03-26 DIAGNOSIS — F1310 Sedative, hypnotic or anxiolytic abuse, uncomplicated: Secondary | ICD-10-CM | POA: Insufficient documentation

## 2015-03-26 DIAGNOSIS — L4050 Arthropathic psoriasis, unspecified: Secondary | ICD-10-CM | POA: Diagnosis present

## 2015-03-26 DIAGNOSIS — R45851 Suicidal ideations: Secondary | ICD-10-CM | POA: Diagnosis present

## 2015-03-26 DIAGNOSIS — A419 Sepsis, unspecified organism: Secondary | ICD-10-CM | POA: Diagnosis present

## 2015-03-26 DIAGNOSIS — F1610 Hallucinogen abuse, uncomplicated: Secondary | ICD-10-CM | POA: Diagnosis not present

## 2015-03-26 DIAGNOSIS — M810 Age-related osteoporosis without current pathological fracture: Secondary | ICD-10-CM | POA: Diagnosis present

## 2015-03-26 DIAGNOSIS — N390 Urinary tract infection, site not specified: Secondary | ICD-10-CM | POA: Diagnosis present

## 2015-03-26 LAB — COMPREHENSIVE METABOLIC PANEL
ALT: 9 U/L — ABNORMAL LOW (ref 14–54)
AST: 17 U/L (ref 15–41)
Albumin: 3.6 g/dL (ref 3.5–5.0)
Alkaline Phosphatase: 71 U/L (ref 38–126)
Anion gap: 9 (ref 5–15)
BILIRUBIN TOTAL: 0.6 mg/dL (ref 0.3–1.2)
BUN: 15 mg/dL (ref 6–20)
CO2: 23 mmol/L (ref 22–32)
Calcium: 8.8 mg/dL — ABNORMAL LOW (ref 8.9–10.3)
Chloride: 100 mmol/L — ABNORMAL LOW (ref 101–111)
Creatinine, Ser: 1.09 mg/dL — ABNORMAL HIGH (ref 0.44–1.00)
GFR, EST NON AFRICAN AMERICAN: 56 mL/min — AB (ref >60)
Glucose, Bld: 175 mg/dL — ABNORMAL HIGH (ref 65–99)
POTASSIUM: 3.6 mmol/L (ref 3.5–5.1)
Sodium: 132 mmol/L — ABNORMAL LOW (ref 135–145)
TOTAL PROTEIN: 7.9 g/dL (ref 6.5–8.1)

## 2015-03-26 LAB — CBC
HEMATOCRIT: 29.4 % — AB (ref 35.0–47.0)
HEMOGLOBIN: 9.5 g/dL — AB (ref 12.0–16.0)
MCH: 26.2 pg (ref 26.0–34.0)
MCHC: 32.5 g/dL (ref 32.0–36.0)
MCV: 80.5 fL (ref 80.0–100.0)
Platelets: 317 10*3/uL (ref 150–440)
RBC: 3.65 MIL/uL — ABNORMAL LOW (ref 3.80–5.20)
RDW: 19.1 % — AB (ref 11.5–14.5)
WBC: 8.2 10*3/uL (ref 3.6–11.0)

## 2015-03-26 LAB — URINE DRUG SCREEN, QUALITATIVE (ARMC ONLY)
AMPHETAMINES, UR SCREEN: NOT DETECTED
BARBITURATES, UR SCREEN: NOT DETECTED
Benzodiazepine, Ur Scrn: POSITIVE — AB
COCAINE METABOLITE, UR ~~LOC~~: NOT DETECTED
Cannabinoid 50 Ng, Ur ~~LOC~~: NOT DETECTED
MDMA (Ecstasy)Ur Screen: NOT DETECTED
METHADONE SCREEN, URINE: NOT DETECTED
OPIATE, UR SCREEN: NOT DETECTED
Phencyclidine (PCP) Ur S: POSITIVE — AB
TRICYCLIC, UR SCREEN: NOT DETECTED

## 2015-03-26 LAB — ACETAMINOPHEN LEVEL

## 2015-03-26 LAB — SALICYLATE LEVEL: Salicylate Lvl: <4 mg/dL (ref 2.8–30.0)

## 2015-03-26 LAB — ETHANOL: Alcohol, Ethyl (B): <5 mg/dL (ref <5)

## 2015-03-26 MED ORDER — LISINOPRIL 20 MG PO TABS
20.0000 mg | ORAL_TABLET | Freq: Every day | ORAL | Status: DC
  Administered 2015-03-27 – 2015-04-01 (×6): 20 mg via ORAL
  Filled 2015-03-26 (×6): qty 1

## 2015-03-26 MED ORDER — FERROUS SULFATE 325 (65 FE) MG PO TABS
325.0000 mg | ORAL_TABLET | Freq: Every day | ORAL | Status: DC
  Administered 2015-03-27 – 2015-04-01 (×6): 325 mg via ORAL
  Filled 2015-03-26 (×6): qty 1

## 2015-03-26 MED ORDER — PREDNISONE 10 MG PO TABS
10.0000 mg | ORAL_TABLET | Freq: Every day | ORAL | Status: DC
  Administered 2015-03-27 – 2015-04-01 (×6): 10 mg via ORAL
  Filled 2015-03-26 (×6): qty 1

## 2015-03-26 MED ORDER — METHOTREXATE 2.5 MG PO TABS
10.0000 mg | ORAL_TABLET | ORAL | Status: DC
  Administered 2015-03-27: 10 mg via ORAL
  Filled 2015-03-26: qty 4

## 2015-03-26 MED ORDER — TRAMADOL HCL 50 MG PO TABS
50.0000 mg | ORAL_TABLET | Freq: Four times a day (QID) | ORAL | Status: DC | PRN
  Administered 2015-03-26: 50 mg via ORAL
  Filled 2015-03-26 (×2): qty 1

## 2015-03-26 MED ORDER — VENLAFAXINE HCL ER 75 MG PO CP24
150.0000 mg | ORAL_CAPSULE | Freq: Every day | ORAL | Status: DC
  Administered 2015-03-27 – 2015-04-01 (×6): 150 mg via ORAL
  Filled 2015-03-26 (×6): qty 2

## 2015-03-26 MED ORDER — MAGNESIUM HYDROXIDE 400 MG/5ML PO SUSP
30.0000 mL | Freq: Every day | ORAL | Status: DC | PRN
Start: 2015-03-26 — End: 2015-04-01

## 2015-03-26 MED ORDER — ZOLPIDEM TARTRATE 5 MG PO TABS
5.0000 mg | ORAL_TABLET | Freq: Every evening | ORAL | Status: DC | PRN
Start: 2015-03-26 — End: 2015-04-01
  Administered 2015-03-26 – 2015-03-31 (×6): 5 mg via ORAL
  Filled 2015-03-26 (×6): qty 1

## 2015-03-26 MED ORDER — ACETAMINOPHEN 325 MG PO TABS
650.0000 mg | ORAL_TABLET | Freq: Four times a day (QID) | ORAL | Status: DC | PRN
  Administered 2015-03-27: 650 mg via ORAL
  Filled 2015-03-26: qty 2

## 2015-03-26 MED ORDER — ALUM & MAG HYDROXIDE-SIMETH 200-200-20 MG/5ML PO SUSP
30.0000 mL | ORAL | Status: DC | PRN
Start: 2015-03-26 — End: 2015-04-01

## 2015-03-26 NOTE — ED Notes (Signed)
Report called to [facility], RN

## 2015-03-26 NOTE — ED Provider Notes (Signed)
[facility] Emergency Department Provider Note  ____________________________________________  Time seen: 1759  I have reviewed the triage vital signs and the nursing notes.   HISTORY  Chief Complaint Depression     HPI Tara Brown is a 54 y.o. female who has psoriatic arthritis but presents with anxiety and depression. She reports that her physical condition is difficult and worrisome. She feels that she cannot take care of herself and she is anxious about what is to come of her. She feels she cannot burden her family. She has fleeting thoughts of self-harm. She was recently admitted to the hospital, spinning 5 days on a medical floor and 5 days in the psychiatric area. She now returns reporting she thinks she needs to be readmitted.  She reports her usual level of discomfort. She is not in any acute distress. She denies any chest pain or shortness of breath.   Past Medical History  Diagnosis Date  . Osteoporosis   . Psoriatic arthritis   . Anemia   . Depression     Patient Active Problem List   Diagnosis Date Noted  . Psoriatic arthritis 03/11/2015  . Osteoporosis 03/11/2015  . Major depressive disorder, recurrent, severe without psychotic features 03/11/2015  . Sepsis secondary to UTI 03/08/2015  . Leukopenia 03/08/2015  . Anemia of chronic disease 03/08/2015    Past Surgical History  Procedure Laterality Date  . Hip fracture surgery    . Knee surgery    . Foot surgery    . Hand surgery    . Neck surgery      Current Outpatient Rx  Name  Route  Sig  Dispense  Refill  . calcium carbonate (TUMS - DOSED IN MG ELEMENTAL CALCIUM) 500 MG chewable tablet   Oral   Chew 1 tablet (200 mg of elemental calcium total) by mouth daily.   30 tablet   0   . ferrous sulfate 325 (65 FE) MG tablet   Oral   Take 1 tablet (325 mg total) by mouth 2 (two) times daily with a meal.   60 tablet   0   . folic acid (FOLVITE) 1 MG tablet   Oral   Take  1 mg by mouth daily.         Marland Kitchen lidocaine (XYLOCAINE) 4 % external solution   Topical   Apply topically daily. Apply to shoulder and lower back   60 mL   0   . lisinopril (PRINIVIL,ZESTRIL) 20 MG tablet   Oral   Take 1 tablet (20 mg total) by mouth daily.   30 tablet   0   . methotrexate (RHEUMATREX) 2.5 MG tablet   Oral   Take 10 mg by mouth once a week. Caution:Chemotherapy. Protect from light.         . Omeprazole (PRILOSEC PO)   Oral   Take by mouth.         . predniSONE (DELTASONE) 10 MG tablet   Oral   Take 10 mg by mouth daily with breakfast.         . traMADol (ULTRAM) 50 MG tablet   Oral   Take 2 tablets (100 mg total) by mouth 4 (four) times daily -  with meals and at bedtime.   1 tablet   0   . venlafaxine XR (EFFEXOR-XR) 150 MG 24 hr capsule   Oral   Take 1 capsule (150 mg total) by mouth daily with breakfast.   30 capsule   0   .  zolpidem (AMBIEN) 10 MG tablet   Oral   Take 1 tablet (10 mg total) by mouth at bedtime as needed for sleep.   1 tablet   0     Allergies Review of patient's allergies indicates no known allergies.  Family History  Problem Relation Age of Onset  . Stroke Neg Hx     Social History History  Substance Use Topics  . Smoking status: Never Smoker   . Smokeless tobacco: Never Used  . Alcohol Use: No    Review of Systems  Constitutional: Negative for fever. ENT: Negative for sore throat. Cardiovascular: Negative for chest pain. Respiratory: Negative for shortness of breath. Gastrointestinal: Negative for abdominal pain, vomiting and diarrhea. Genitourinary: Negative for dysuria. Musculoskeletal: Complex history of psoriatic arthritis. No acute changes. Skin: Negative for rash. Neurological: Negative for headaches   10-point ROS otherwise negative.  ____________________________________________   PHYSICAL EXAM:  VITAL SIGNS: ED Triage Vitals  Enc Vitals Group     BP 03/26/15 1632 106/68 mmHg      Pulse Rate 03/26/15 1632 85     Resp 03/26/15 1632 16     Temp 03/26/15 1632 98.3 F (36.8 C)     Temp Source 03/26/15 1632 Oral     SpO2 03/26/15 1632 97 %     Weight 03/26/15 1632 125 lb (56.7 kg)     Height 03/26/15 1632 5\' 6"  (1.676 m)     Head Cir --      Peak Flow --      Pain Score 03/26/15 1635 6     Pain Loc --      Pain Edu? --      Excl. in GC? --     Constitutional:  Alert and oriented. Well appearing and in no distress. ENT   Head: Normocephalic and atraumatic.   Nose: No congestion/rhinnorhea.   Mouth/Throat: Mucous membranes are moist. Cardiovascular: Normal rate, regular rhythm, no murmur noted Respiratory:  Normal respiratory effort, no tachypnea.    Breath sounds are clear and equal bilaterally.  Gastrointestinal: Soft and nontender. No distention.  Back: No muscle spasm, no tenderness, no CVA tenderness. Musculoskeletal: Deformity consistent with psoriatic arthritis in both hands. .  No noted edema. Neurologic:  Normal speech and language. No gross focal neurologic deficits are appreciated.  Skin:  Skin is warm, dry. No rash noted. Psychiatric: Pleasant, communicative, interactive, good insight. Somewhat depressed affect. Does report thoughts of self-harm that are fleeting. ____________________________________________    LABS (pertinent positives/negatives)  Labs Reviewed  COMPREHENSIVE METABOLIC PANEL - Abnormal; Notable for the following:    Sodium 132 (*)    Chloride 100 (*)    Glucose, Bld 175 (*)    Creatinine, Ser 1.09 (*)    Calcium 8.8 (*)    ALT 9 (*)    GFR calc non Af Amer 56 (*)    All other components within normal limits  ACETAMINOPHEN LEVEL - Abnormal; Notable for the following:    Acetaminophen (Tylenol), Serum <10 (*)    All other components within normal limits  CBC - Abnormal; Notable for the following:    RBC 3.65 (*)    Hemoglobin 9.5 (*)    HCT 29.4 (*)    RDW 19.1 (*)    All other components within normal limits   URINE DRUG SCREEN, QUALITATIVE ([facility] ONLY) - Abnormal; Notable for the following:    Phencyclidine (PCP) Ur S POSITIVE (*)    Benzodiazepine, Ur Scrn POSITIVE (*)  All other components within normal limits  ETHANOL  SALICYLATE LEVEL     ____________________________________________  INITIAL IMPRESSION / ASSESSMENT AND PLAN / ED COURSE  Pertinent labs & imaging results that were available during my care of the patient were reviewed by me and considered in my medical decision making (see chart for details).  Patient with thoughts of self-harm and feeling overwhelmed. I discussed the case with Dr. Toni Amend will see the patient emergency department.  ----------------------------------------- 7:26 PM on 03/26/2015 -----------------------------------------  Patient has been seen by Dr. Toni Amend. She will be admitted to the behavioral health unit in the hospital.  ____________________________________________   FINAL CLINICAL IMPRESSION(S) / ED DIAGNOSES  Final diagnoses:  Major depressive disorder, recurrent, severe without psychotic features  Psoriatic arthritis      Darien Ramus, MD 03/26/15 1928

## 2015-03-26 NOTE — ED Notes (Signed)
BEHAVIORAL HEALTH ROUNDING Patient sleeping: No. Patient alert and oriented: yes Behavior appropriate: Yes.  ; If no, describe:  Nutrition and fluids offered: Yes  Toileting and hygiene offered: Yes  Sitter present: not applicable Law enforcement present: Yes  

## 2015-03-26 NOTE — BH Assessment (Signed)
Assessment Note  Tara Brown is an 54 y.o. female who presents to the ER via her sister due to increase depression an ongoing thoughts of SI. Per the report of the patient, she was recently inpatient at [facility] due to some of the same symptoms. She states, she was discharged on 03/16/2015 and hasn't seen any improvement. She stop taking her medications and have no motivation to do things. She states her sister helps her with things around her home. Patient lives alone. She currently have no appetite but reports of having no weight lost. Other symptoms of depression includes, tearfulness, isolating, feeling helpless, hopeless, worthless and in disappear.   Her thoughts of suicide includes overdosing on medications. When asked about attempts, she denies having any. She further explains, the thoughts have increased and she is unsure if she will follow through with it. She is unable to contract for safety at this time.  The main reason for her depression, is her failing health. She states, she has to have several surgeries for her arm and hand but the recovery time is too long. The last surgery she had, was on her hand and she states, it didn't heal as fast and as good as it should have.  She denies HI and AV/H. She also denies a history of being involved in the legal system and the use of any mind-altering substances.  Highest level of education, includes two bachelor degrees.  Axis I: Major Depression, Recurrent severe Axis III:  Past Medical History  Diagnosis Date  . Osteoporosis   . Psoriatic arthritis   . Anemia   . Depression    Axis IV: other psychosocial or environmental problems, problems related to social environment and problems with access to health care services  Past Medical History:  Past Medical History  Diagnosis Date  . Osteoporosis   . Psoriatic arthritis   . Anemia   . Depression     Past Surgical History  Procedure Laterality Date  . Hip fracture surgery    .  Knee surgery    . Foot surgery    . Hand surgery    . Neck surgery      Family History:  Family History  Problem Relation Age of Onset  . Stroke Neg Hx     Social History:  reports that she has never smoked. She has never used smokeless tobacco. She reports that she does not drink alcohol or use illicit drugs.  Additional Social History:  Alcohol / Drug Use Pain Medications: None Reported Prescriptions: Tramadol, Percocet - physician prescribed for chronic pain, no abuse, per last assessment Over the Counter: None Reported History of alcohol / drug use?: Yes (None Reported) Longest period of sobriety (when/how long): None Reported Negative Consequences of Use:  (None Reported) Withdrawal Symptoms:  (None Reported)  CIWA: CIWA-Ar BP: 106/68 mmHg Pulse Rate: 85 COWS:    Allergies: No Known Allergies  Home Medications:  (Not in a hospital admission)  OB/GYN Status:  No LMP recorded. Patient is postmenopausal.  General Assessment Data Location of Assessment: [facility] ED TTS Assessment: In system Is this a Tele or Face-to-Face Assessment?: Face-to-Face Is this an Initial Assessment or a Re-assessment for this encounter?: Initial Assessment Marital status: Divorced [place] name: n/a Is patient pregnant?: No Pregnancy Status: No Living Arrangements: Alone Can pt return to current living arrangement?: Yes Admission Status: Voluntary Is patient capable of signing voluntary admission?: Yes Referral Source: Self/Family/Friend Insurance type: Medicare  Medical Screening Exam [facility] Walk-in ONLY)  Medical Exam completed: Yes  Crisis Care Plan Living Arrangements: Alone Name of Psychiatrist: n/a Name of Therapist: n/a  Education Status Is patient currently in school?: No Current Grade: n/a Highest grade of school patient has completed: Two Bachelor Degrees  Name of school: n/a Contact person: n/a  Risk to self with the past 6 months Suicidal Ideation: Yes-Currently  Present Has patient been a risk to self within the past 6 months prior to admission? : Yes Suicidal Intent: Yes-Currently Present Has patient had any suicidal intent within the past 6 months prior to admission? : Yes Is patient at risk for suicide?: Yes Suicidal Plan?: Yes-Currently Present Has patient had any suicidal plan within the past 6 months prior to admission? : Yes Specify Current Suicidal Plan: Overdose on medication Access to Means: Yes Specify Access to Suicidal Means: Patient has medications What has been your use of drugs/alcohol within the last 12 months?: None Reported Previous Attempts/Gestures: No How many times?: 0 Other Self Harm Risks: 0 Triggers for Past Attempts: Other (Comment) (Worsening Health) Intentional Self Injurious Behavior: None (None Reported) Family Suicide History: Unknown Recent stressful life event(s): Other (Comment) (Worsening Health) Persecutory voices/beliefs?: No Depression: Yes Depression Symptoms: Feeling worthless/self pity, Loss of interest in usual pleasures, Guilt, Fatigue, Isolating, Tearfulness, Insomnia, Despondent Substance abuse history and/or treatment for substance abuse?: No Suicide prevention information given to non-admitted patients: Not applicable  Risk to Others within the past 6 months Homicidal Ideation: No Does patient have any lifetime risk of violence toward others beyond the six months prior to admission? : No Thoughts of Harm to Others: No Current Homicidal Intent: No Current Homicidal Plan: No Access to Homicidal Means: No Identified Victim: None Reported History of harm to others?: No Assessment of Violence: None Noted Violent Behavior Description: None Reported Does patient have access to weapons?: No Criminal Charges Pending?: No Does patient have a court date: No Is patient on probation?: No  Psychosis Hallucinations: None noted Delusions: None noted  Mental Status Report Appearance/Hygiene: In  hospital gown, In scrubs, Unremarkable Eye Contact: Good Motor Activity: Freedom of movement, Unremarkable Speech: Logical/coherent Level of Consciousness: Alert Mood: Depressed, Anxious Affect: Appropriate to circumstance Anxiety Level: Minimal Thought Processes: Coherent, Relevant Judgement: Unimpaired Orientation: Person, Place, Time, Situation, Appropriate for developmental age Obsessive Compulsive Thoughts/Behaviors: Severe  Cognitive Functioning Concentration: Normal Memory: Recent Intact, Remote Intact IQ: Average Insight: Poor Impulse Control: Poor Appetite: Fair Weight Loss: 0 Weight Gain: 0 Sleep: No Change Total Hours of Sleep: 8 Vegetative Symptoms: None  ADLScreening [facility] Assessment Services) Patient's cognitive ability adequate to safely complete daily activities?: Yes Patient able to express need for assistance with ADLs?: Yes Independently performs ADLs?: Yes (appropriate for developmental age) (With some assistance)  Prior Inpatient Therapy Prior Inpatient Therapy: Yes Prior Therapy Dates: 03/2015 Prior Therapy Facilty/Provider(s): [facility] Reason for Treatment: Depression  Prior Outpatient Therapy Prior Outpatient Therapy: No Prior Therapy Dates: n/a Prior Therapy Facilty/Provider(s): n/a Reason for Treatment: n/a Does patient have an ACCT team?: No Does patient have Intensive In-House Services?  : No Does patient have [facility] services? : No Does patient have P4CC services?: No  ADL Screening (condition at time of admission) Patient's cognitive ability adequate to safely complete daily activities?: Yes Patient able to express need for assistance with ADLs?: Yes Independently performs ADLs?: Yes (appropriate for developmental age) (With some assistance)       Abuse/Neglect Assessment (Assessment to be complete while patient is alone) Physical Abuse: Denies Verbal Abuse: Denies Sexual  Abuse: Denies Exploitation of patient/patient's resources:  Denies Self-Neglect: Denies Values / Beliefs Cultural Requests During Hospitalization: None Spiritual Requests During Hospitalization: None Consults Spiritual Care Consult Needed: No Social Work Consult Needed: No Merchant navy officer (For Healthcare) Does patient have an advance directive?: No Would patient like information on creating an advanced directive?: No - patient declined information    Additional Information 1:1 In Past 12 Months?: No CIRT Risk: No Elopement Risk: No Does patient have medical clearance?: Yes  Child/Adolescent Assessment Running Away Risk: Denies (Patient is an adult)  Disposition:  Disposition Initial Assessment Completed for this Encounter: Yes Disposition of Patient: Other dispositions (Psych MD to see) Other disposition(s): Other (Comment) (Psych MD to see)  On Site Evaluation by:   Reviewed with Physician:    Lilyan Gilford 03/26/2015 6:58 PM

## 2015-03-26 NOTE — ED Notes (Signed)
Pt states "I am having problems with my depression", related to her health issues. She reports "fleeting thoughts" of hurting herself, no specific plan of SI at this time.

## 2015-03-26 NOTE — ED Notes (Signed)
BEHAVIORAL HEALTH ROUNDING Patient sleeping: No. Patient alert and oriented: yes Behavior appropriate: Yes.  ; If no, describe:   Nutrition and fluids offered: Yes  Toileting and hygiene offered: Yes  Sitter present: not applicable Law enforcement present: Yes  and ODS

## 2015-03-26 NOTE — ED Notes (Signed)
Report called to [facility], RN

## 2015-03-26 NOTE — ED Notes (Signed)
ED BHU PLACEMENT JUSTIFICATION Is the patient under IVC or is there intent for IVC: No. Is the patient medically cleared: Yes.   Is there vacancy in the ED BHU: Yes.   Is the population mix appropriate for patient: Yes.   Is the patient awaiting placement in inpatient or outpatient setting: No. Has the patient had a psychiatric consult: No. Survey of unit performed for contraband, proper placement and condition of furniture, tampering with fixtures in bathroom, shower, and each patient room: Yes.  ; Findings:  APPEARANCE/BEHAVIOR calm, cooperative and adequate rapport can be established NEURO ASSESSMENT Orientation: time, place and person Hallucinations: No.None noted (Hallucinations) Speech: Normal Gait: normal RESPIRATORY ASSESSMENT Normal expansion.  Clear to auscultation.  No rales, rhonchi, or wheezing. CARDIOVASCULAR ASSESSMENT regular rate and rhythm, S1, S2 normal, no murmur, click, rub or gallop GASTROINTESTINAL ASSESSMENT soft, nontender, BS WNL, no r/g EXTREMITIES normal strength, tone, and muscle mass PLAN OF CARE Provide calm/safe environment. Vital signs assessed twice daily. ED BHU Assessment once each 12-hour shift. Collaborate with intake RN daily or as condition indicates. Assure the ED provider has rounded once each shift. Provide and encourage hygiene. Provide redirection as needed. Assess for escalating behavior; address immediately and inform ED provider.  Assess family dynamic and appropriateness for visitation as needed: Yes.  ; If necessary, describe findings:  Educate the patient/family about BHU procedures/visitation: Yes.  ; If necessary, describe findings:  

## 2015-03-26 NOTE — ED Notes (Addendum)

## 2015-03-26 NOTE — ED Notes (Signed)
Dr Clapacs with pt  

## 2015-03-26 NOTE — ED Notes (Addendum)
BEHAVIORAL HEALTH ROUNDING Patient sleeping: No. Patient alert and oriented: yes Behavior appropriate: Yes.  ; If no, describe:   Nutrition and fluids offered: Yes  Toileting and hygiene offered: Yes  Sitter present: not applicable Law enforcement present: Yes  and ODS  ENVIRONMENTAL ASSESSMENT Potentially harmful objects out of patient reach: Yes.   Personal belongings secured: Yes.   Patient dressed in hospital provided attire only: Yes.   Plastic bags out of patient reach: Yes.   Patient care equipment (cords, cables, call bells, lines, and drains) shortened, removed, or accounted for: Yes.   Equipment and supplies removed from bottom of stretcher: Yes.   Potentially toxic materials out of patient reach: Yes.   Sharps container removed or out of patient reach: Yes.

## 2015-03-26 NOTE — ED Notes (Signed)
Pt is here because her sister is "concerned" about pt.  Pt was hospitalized in the BMU from a Thursday through Tuesday about 10 days ago.  Pt was released on medication the pt says "doesn't do anything" for her.  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  She cannot lift her right arm but half normal.  Orthopedic surgeon has told pt that she needs a shoulder replacement, but pt is refusing because she says she has had so many surgeries that she just "can't go through it."  Pt is depressed.

## 2015-03-26 NOTE — Consult Note (Signed)
[facility] Consult   Reason for Consult:  On sulfa this 54 year old woman with major depression and comes back in the hospital because of complaints about suicidality Referring Physician:  Thomasene Lot Patient Identification: Tara Brown MRN:  [id number] Principal Diagnosis: Major depressive disorder, recurrent, severe without psychotic features Diagnosis:   Patient Active Problem List   Diagnosis Date Noted  . Psoriatic arthritis [L40.50] 03/11/2015  . Osteoporosis [M81.0] 03/11/2015  . Major depressive disorder, recurrent, severe without psychotic features [F33.2] 03/11/2015  . Sepsis secondary to UTI [A41.9, N39.0] 03/08/2015  . Leukopenia [D72.819] 03/08/2015  . Anemia of chronic disease [D63.8] 03/08/2015    Total Time spent with patient: 1 hour  Subjective:   Tara Brown is a 54 y.o. female patient admitted with "I feel like a burden to my family". Patient admits to multiple severe symptoms of depression.Tara Brown  HPI:  Patient from the patient and the chart. This 54 year old woman was brought to the emergency room by her sister who came to visit her and found that the patient had not been taking any of her medicines are taking care of herself. Patient states that since she was discharged from the psychiatric ward recently she has become even more hopeless. She intentionally stopped taking all of her medications because she feels like there is no point in trying to treat herself anymore. She is having concerns that she is going to be a chronic burden to her family. She's been having active suicidal thoughts with thoughts of hanging herself or overdosing on medicine. She denies any hallucinations. She is not abusing any substances. She has not been following up with any outpatient treatment. Patient states that her current depression has only been really severe for about 2 months now. She has lost all interest in normal activities. She relates quite a bit of it to her medical  disability but it seems clear that the depression came on after she already had medical disability.  Past psychiatric history: One previous psychiatric hospitalization just recently was discharged only a week or so ago. Diagnosed with severe major depression. Prior to that she had been treated with Lexapro by her primary care doctor for years with probably dubious benefit. No prior suicide attempts. No history of psychosis. No history of mania.  Medical history: Patient has a diagnosis of psoriatic arthritis but she has rather remarkable deformities of her wrists and hands and shoulders. It's clear that she has a lot of difficulty doing even basic tasks. She looks like she hasn't been eating well and appears to be cachectic. Has chronic anemia probably from poor nutrition. She has not been compliant with her treatment for her arthritis.  Social history: Lives by herself. She was married once but divorced 20 years ago never remarried. No children. Closest relative is her sister. Patient has little social life.  Substance abuse history: No alcohol or drug abuse currently or in the past.  Current medications: Noncompliant   HPI Elements:   Quality:  Severe depression with suicidal ideation. Severity:  Very severe life-threatening. Timing:  Depression present for the last couple months worse even now than it was on her last admission. Duration:  Going on for months getting worse. Context:  Chronic medical disability.  Past Medical History:  Past Medical History  Diagnosis Date  . Osteoporosis   . Psoriatic arthritis   . Anemia   . Depression     Past Surgical History  Procedure Laterality Date  . Hip fracture surgery    .  Knee surgery    . Foot surgery    . Hand surgery    . Neck surgery     Family History:  Family History  Problem Relation Age of Onset  . Stroke Neg Hx    Social History:  History  Alcohol Use No     History  Drug Use No    History   Social History  .  Marital Status: Divorced    Spouse Name: N/A  . Number of Children: N/A  . Years of Education: N/A   Social History Main Topics  . Smoking status: Never Smoker   . Smokeless tobacco: Never Used  . Alcohol Use: No  . Drug Use: No  . Sexual Activity: No   Other Topics Concern  . None   Social History Narrative   Additional Social History:    Pain Medications: None Reported Prescriptions: Tramadol, Percocet - physician prescribed for chronic pain, no abuse, per last assessment Over the Counter: None Reported History of alcohol / drug use?: Yes (None Reported) Longest period of sobriety (when/how long): None Reported Negative Consequences of Use:  (None Reported) Withdrawal Symptoms:  (None Reported)                     Allergies:  No Known Allergies  Labs:  Results for orders placed or performed during the hospital encounter of 03/26/15 (from the past 48 hour(s))  Comprehensive metabolic panel     Status: Abnormal   Collection Time: 03/26/15  4:50 PM  Result Value Ref Range   Sodium 132 (L) 135 - 145 mmol/L   Potassium 3.6 3.5 - 5.1 mmol/L   Chloride 100 (L) 101 - 111 mmol/L   CO2 23 22 - 32 mmol/L   Glucose, Bld 175 (H) 65 - 99 mg/dL   BUN 15 6 - 20 mg/dL   Creatinine, Ser 1.09 (H) 0.44 - 1.00 mg/dL   Calcium 8.8 (L) 8.9 - 10.3 mg/dL   Total Protein 7.9 6.5 - 8.1 g/dL   Albumin 3.6 3.5 - 5.0 g/dL   AST 17 15 - 41 U/L   ALT 9 (L) 14 - 54 U/L   Alkaline Phosphatase 71 38 - 126 U/L   Total Bilirubin 0.6 0.3 - 1.2 mg/dL   GFR calc non Af Amer 56 (L) >60 mL/min   GFR calc Af Amer >60 >60 mL/min    Comment: (NOTE) The eGFR has been calculated using the CKD EPI equation. This calculation has not been validated in all clinical situations. eGFR's persistently <60 mL/min signify possible Chronic Kidney Disease.    Anion gap 9 5 - 15  Ethanol (ETOH)     Status: None   Collection Time: 03/26/15  4:50 PM  Result Value Ref Range   Alcohol, Ethyl (B) <5 <5 mg/dL     Comment:        LOWEST DETECTABLE LIMIT FOR SERUM ALCOHOL IS 5 mg/dL FOR MEDICAL PURPOSES ONLY   Salicylate level     Status: None   Collection Time: 03/26/15  4:50 PM  Result Value Ref Range   Salicylate Lvl <1.5 2.8 - 30.0 mg/dL  Acetaminophen level     Status: Abnormal   Collection Time: 03/26/15  4:50 PM  Result Value Ref Range   Acetaminophen (Tylenol), Serum <10 (L) 10 - 30 ug/mL    Comment:        THERAPEUTIC CONCENTRATIONS VARY SIGNIFICANTLY. A RANGE OF 10-30 ug/mL MAY BE AN EFFECTIVE CONCENTRATION FOR MANY  PATIENTS. HOWEVER, SOME ARE BEST TREATED AT CONCENTRATIONS OUTSIDE THIS RANGE. ACETAMINOPHEN CONCENTRATIONS >150 ug/mL AT 4 HOURS AFTER INGESTION AND >50 ug/mL AT 12 HOURS AFTER INGESTION ARE OFTEN ASSOCIATED WITH TOXIC REACTIONS.   CBC     Status: Abnormal   Collection Time: 03/26/15  4:50 PM  Result Value Ref Range   WBC 8.2 3.6 - 11.0 K/uL   RBC 3.65 (L) 3.80 - 5.20 MIL/uL   Hemoglobin 9.5 (L) 12.0 - 16.0 g/dL   HCT 29.4 (L) 35.0 - 47.0 %   MCV 80.5 80.0 - 100.0 fL   MCH 26.2 26.0 - 34.0 pg   MCHC 32.5 32.0 - 36.0 g/dL   RDW 19.1 (H) 11.5 - 14.5 %   Platelets 317 150 - 440 K/uL  Urine Drug Screen, Qualitative ([facility] only)     Status: Abnormal   Collection Time: 03/26/15  4:50 PM  Result Value Ref Range   Tricyclic, Ur Screen NONE DETECTED NONE DETECTED   Amphetamines, Ur Screen NONE DETECTED NONE DETECTED   MDMA (Ecstasy)Ur Screen NONE DETECTED NONE DETECTED   Cocaine Metabolite,Ur [place] NONE DETECTED NONE DETECTED   Opiate, Ur Screen NONE DETECTED NONE DETECTED   Phencyclidine (PCP) Ur S POSITIVE (A) NONE DETECTED   Cannabinoid 50 Ng, Ur [place] NONE DETECTED NONE DETECTED   Barbiturates, Ur Screen NONE DETECTED NONE DETECTED   Benzodiazepine, Ur Scrn POSITIVE (A) NONE DETECTED   Methadone Scn, Ur NONE DETECTED NONE DETECTED    Comment: (NOTE) 003  Tricyclics, urine               Cutoff 1000 ng/mL 200  Amphetamines, urine             Cutoff 1000  ng/mL 300  MDMA (Ecstasy), urine           Cutoff 500 ng/mL 400  Cocaine Metabolite, urine       Cutoff 300 ng/mL 500  Opiate, urine                   Cutoff 300 ng/mL 600  Phencyclidine (PCP), urine      Cutoff 25 ng/mL 700  Cannabinoid, urine              Cutoff 50 ng/mL 800  Barbiturates, urine             Cutoff 200 ng/mL 900  Benzodiazepine, urine           Cutoff 200 ng/mL 1000 Methadone, urine                Cutoff 300 ng/mL 1100 1200 The urine drug screen provides only a preliminary, unconfirmed 1300 analytical test result and should not be used for non-medical 1400 purposes. Clinical consideration and professional judgment should 1500 be applied to any positive drug screen result due to possible 1600 interfering substances. A more specific alternate chemical method 1700 must be used in order to obtain a confirmed analytical result.  1800 Gas chromato graphy / mass spectrometry (GC/MS) is the preferred 1900 confirmatory method.     Vitals: Blood pressure 106/68, pulse 85, temperature 98.3 F (36.8 C), temperature source Oral, resp. rate 16, height 5' 6"  (1.676 m), weight 56.7 kg (125 lb), SpO2 97 %.  Risk to Self: Suicidal Ideation: Yes-Currently Present Suicidal Intent: Yes-Currently Present Is patient at risk for suicide?: Yes Suicidal Plan?: Yes-Currently Present Specify Current Suicidal Plan: Overdose on medication Access to Means: Yes Specify Access to Suicidal Means: Patient has medications What has  been your use of drugs/alcohol within the last 12 months?: None Reported How many times?: 0 Other Self Harm Risks: 0 Triggers for Past Attempts: Other (Comment) (Worsening Health) Intentional Self Injurious Behavior: None (None Reported) Risk to Others: Homicidal Ideation: No Thoughts of Harm to Others: No Current Homicidal Intent: No Current Homicidal Plan: No Access to Homicidal Means: No Identified Victim: None Reported History of harm to others?: No Assessment  of Violence: None Noted Violent Behavior Description: None Reported Does patient have access to weapons?: No Criminal Charges Pending?: No Does patient have a court date: No Prior Inpatient Therapy: Prior Inpatient Therapy: Yes Prior Therapy Dates: 03/2015 Prior Therapy Facilty/Provider(s): [facility] Reason for Treatment: Depression Prior Outpatient Therapy: Prior Outpatient Therapy: No Prior Therapy Dates: n/a Prior Therapy Facilty/Provider(s): n/a Reason for Treatment: n/a Does patient have an ACCT team?: No Does patient have Intensive In-House Services?  : No Does patient have [facility] services? : No Does patient have [facility] services?: No  No current facility-administered medications for this encounter.   Current Outpatient Prescriptions  Medication Sig Dispense Refill  . calcium carbonate (TUMS - DOSED IN MG ELEMENTAL CALCIUM) 500 MG chewable tablet Chew 1 tablet (200 mg of elemental calcium total) by mouth daily. 30 tablet 0  . ferrous sulfate 325 (65 FE) MG tablet Take 1 tablet (325 mg total) by mouth 2 (two) times daily with a meal. 60 tablet 0  . folic acid (FOLVITE) 1 MG tablet Take 1 mg by mouth daily.    Tara Brown lidocaine (XYLOCAINE) 4 % external solution Apply topically daily. Apply to shoulder and lower back 60 mL 0  . lisinopril (PRINIVIL,ZESTRIL) 20 MG tablet Take 1 tablet (20 mg total) by mouth daily. 30 tablet 0  . methotrexate (RHEUMATREX) 2.5 MG tablet Take 10 mg by mouth once a week. Caution:Chemotherapy. Protect from light.    . Omeprazole (PRILOSEC PO) Take by mouth.    . predniSONE (DELTASONE) 10 MG tablet Take 10 mg by mouth daily with breakfast.    . traMADol (ULTRAM) 50 MG tablet Take 2 tablets (100 mg total) by mouth 4 (four) times daily -  with meals and at bedtime. 1 tablet 0  . venlafaxine XR (EFFEXOR-XR) 150 MG 24 hr capsule Take 1 capsule (150 mg total) by mouth daily with breakfast. 30 capsule 0  . zolpidem (AMBIEN) 10 MG tablet Take 1 tablet (10 mg total) by  mouth at bedtime as needed for sleep. 1 tablet 0    Musculoskeletal: Strength & Muscle Tone: abnormal and decreased Gait & Station: unsteady Patient leans: N/A  Psychiatric Specialty Exam: Physical Exam  Constitutional: She appears well-developed and well-nourished. She appears lethargic. She appears cachectic. She has a sickly appearance.  HENT:  Head: Normocephalic and atraumatic.  Eyes: Conjunctivae are normal. Pupils are equal, round, and reactive to light.  Neck: Normal range of motion.  Cardiovascular: Normal heart sounds.   Respiratory: Effort normal.  GI: Soft.  Musculoskeletal: Normal range of motion.       Arms: Neurological: She appears lethargic.  Skin: Skin is warm and dry.  Psychiatric: Her affect is blunt. Her speech is delayed. She is withdrawn. Cognition and memory are impaired. She expresses inappropriate judgment. She exhibits a depressed mood. She expresses suicidal ideation.  Chronically ill-appearing woman who is cooperative with the evaluation. Affect is flat and dysphoric. Mood is stated as depressed. Patient having recent suicidal ideation with intent. Feelings of overwhelming hopelessness. Denies hallucinations.    Review of Systems  Constitutional: Positive for malaise/fatigue.  HENT: Negative.   Eyes: Negative.   Respiratory: Negative.   Cardiovascular: Negative.   Gastrointestinal: Negative.   Musculoskeletal: Positive for joint pain and falls.  Skin: Negative.   Neurological: Positive for focal weakness and weakness.  Psychiatric/Behavioral: Positive for depression and suicidal ideas. The patient is nervous/anxious and has insomnia.     Blood pressure 106/68, pulse 85, temperature 98.3 F (36.8 C), temperature source Oral, resp. rate 16, height 5' 6"  (1.676 m), weight 56.7 kg (125 lb), SpO2 97 %.Body mass index is 20.19 kg/(m^2).  General Appearance: Disheveled  Eye Sport and exercise psychologist::  Fair  Speech:  Slow  Volume:  Decreased  Mood:  Depressed  Affect:   Blunt  Thought Process:  Goal Directed  Orientation:  Full (Time, Place, and Person)  Thought Content:  Negative  Suicidal Thoughts:  Yes.  with intent/plan  Homicidal Thoughts:  No  Memory:  Immediate;   Good Recent;   Good Remote;   Fair  Judgement:  Impaired  Insight:  Shallow  Psychomotor Activity:  Decreased  Concentration:  Poor  Recall:  Poor  Fund of Knowledge:Poor  Language: Fair  Akathisia:  No  Handed:  Right  AIMS (if indicated):     Assets:  Armed forces logistics/support/administrative officer Social Support  ADL's:  Impaired  Cognition: WNL  Sleep:      Medical Decision Making: Review of Psycho-Social Stressors (1), Review or order clinical lab tests (1), Established Problem, Worsening (2), Review of Medication Regimen & Side Effects (2) and Review of New Medication or Change in Dosage (2)  Treatment Plan Summary: Medication management and Plan Patient with active suicidal ideation and major depression requires readmission to the hospital. She was prescribed Effexor 150 mg per day as her primary treatment but has been noncompliant. Patient could be a candidate for ECT but I have not brought this up with her at this time. I will restart all of her medicines including her methotrexate and prednisone that she was on at discharge. Patient is agreeable to the plan. I'm not sure if she is on a commitment but I believe she will be voluntarily admitted to the hospital. Case discussed with emergency room doctor and psychiatry staff.  Plan:  Recommend psychiatric Inpatient admission when medically cleared. Supportive therapy provided about ongoing stressors. Disposition: Admit to psychiatry ward  Alethia Berthold 03/26/2015 7:26 PM

## 2015-03-27 DIAGNOSIS — F332 Major depressive disorder, recurrent severe without psychotic features: Principal | ICD-10-CM

## 2015-03-27 MED ORDER — TRAMADOL HCL 50 MG PO TABS
50.0000 mg | ORAL_TABLET | Freq: Four times a day (QID) | ORAL | Status: DC
  Administered 2015-03-27: 50 mg via ORAL
  Filled 2015-03-27: qty 1

## 2015-03-27 MED ORDER — TRAMADOL HCL 50 MG PO TABS
100.0000 mg | ORAL_TABLET | Freq: Four times a day (QID) | ORAL | Status: DC
  Administered 2015-03-27 – 2015-04-01 (×20): 100 mg via ORAL
  Filled 2015-03-27 (×20): qty 2

## 2015-03-27 MED ORDER — WHITE PETROLATUM GEL
Status: AC
  Administered 2015-03-27: 14:00:00
  Filled 2015-03-27: qty 5

## 2015-03-27 NOTE — Plan of Care (Signed)
Problem: Ineffective individual coping Goal: LTG: Patient will report a decrease in negative feelings Outcome: Not Progressing Continues to profess to depression.  Unable to verbalize any coping mechanisms.  Further states that she does not know what she is gong to do because she lives alone and having difficulty taking care of herself.

## 2015-03-27 NOTE — Progress Notes (Signed)
Patient ID: Tara Brown, female   DOB: Oct 25, 1960, 54 y.o.   MRN: [id number]

## 2015-03-27 NOTE — H&P (Signed)
Tara Brown is an 54 y.o. female.   Chief Complaint: Suicidal Thoughts  HPI:   Patient is a 54 year old female who was admitted due to having suicidal ideations. She  was recently discharged on July 5. She was brought by her sister who found that the patient had not been taking any of her medicines are taking care of herself. Patient states that since she was discharged from the psychiatric ward recently she has become even more hopeless. She intentionally stopped taking all of her medications because she feels like there is no point in trying to treat herself anymore. She is having concerns that she is going to be a chronic burden to her family. She's been having active suicidal thoughts with thoughts of hanging herself or overdosing on medicine. She denies any hallucinations. She is not abusing any substances. She has not been following up with any outpatient treatment. Patient states that her current depression has only been really severe for about 2 months now. She has lost all interest in normal activities. She relates quite a bit of it to her medical disability but it seems clear that the depression came on after she already had medical disability.   She was d/c on July 5th and readmitted due to suicidal thoughts. She stated that she had possible plans of OD on medications. She never followed on outpt. She stated that she has been depressed, hopeless, helpess, and she was started on effexor last time and no change was noted.   Patient reports a long history of depression for which she has been receiving care by her primary care provider with Lexapro.   Substance abuse history: Patient denies any history of alcohol or drug use patient denies abusing any nicotine products.  HPI Elements:  Severity: sevre Timing: chronic with acute exacerbation Duration: 2 months Context: physical limitation, inability to perform ADL, worsening pain  Past psychiatry History: This patient had  behavior health in [place] in 2009 for depression. Patient states she was hospitalized there after her mother passed away. She was kept in the hospital for about 5 days. She denies any history of suicidal attempts or self-injurious behaviors. Patient reports taking Lexapro prescribed by her primary care provider for about 5 years. In the past she tried Prozac and citalopram and she does not remember what her reaction was to these 2 agents.    Past Medical History  Diagnosis Date  . Osteoporosis   . Psoriatic arthritis   . Anemia   . Depression     Past Surgical History  Procedure Laterality Date  . Hip fracture surgery    . Knee surgery    . Foot surgery    . Hand surgery    . Neck surgery      Family History  Problem Relation Age of Onset  . Stroke Neg Hx    Social History:  reports that she has never smoked. She has never used smokeless tobacco. She reports that she does not drink alcohol or use illicit drugs.  Allergies: No Known Allergies  Medications Prior to Admission  Medication Sig Dispense Refill  . calcium carbonate (TUMS - DOSED IN MG ELEMENTAL CALCIUM) 500 MG chewable tablet Chew 1 tablet (200 mg of elemental calcium total) by mouth daily. 30 tablet 0  . ferrous sulfate 325 (65 FE) MG tablet Take 1 tablet (325 mg total) by mouth 2 (two) times daily with a meal. 60 tablet 0  . folic acid (FOLVITE) 1 MG tablet Take 1 mg by  mouth daily.    Marland Kitchen lidocaine (XYLOCAINE) 4 % external solution Apply topically daily. Apply to shoulder and lower back 60 mL 0  . lisinopril (PRINIVIL,ZESTRIL) 20 MG tablet Take 1 tablet (20 mg total) by mouth daily. 30 tablet 0  . methotrexate (RHEUMATREX) 2.5 MG tablet Take 10 mg by mouth once a week. Caution:Chemotherapy. Protect from light.    . Omeprazole (PRILOSEC PO) Take by mouth.    . predniSONE (DELTASONE) 10 MG tablet Take 10 mg by mouth daily with breakfast.    . traMADol (ULTRAM) 50 MG tablet Take 2 tablets (100 mg total) by mouth 4  (four) times daily -  with meals and at bedtime. 1 tablet 0  . venlafaxine XR (EFFEXOR-XR) 150 MG 24 hr capsule Take 1 capsule (150 mg total) by mouth daily with breakfast. 30 capsule 0  . zolpidem (AMBIEN) 10 MG tablet Take 1 tablet (10 mg total) by mouth at bedtime as needed for sleep. 1 tablet 0    Results for orders placed or performed during the hospital encounter of 03/26/15 (from the past 48 hour(s))  Comprehensive metabolic panel     Status: Abnormal   Collection Time: 03/26/15  4:50 PM  Result Value Ref Range   Sodium 132 (L) 135 - 145 mmol/L   Potassium 3.6 3.5 - 5.1 mmol/L   Chloride 100 (L) 101 - 111 mmol/L   CO2 23 22 - 32 mmol/L   Glucose, Bld 175 (H) 65 - 99 mg/dL   BUN 15 6 - 20 mg/dL   Creatinine, Ser 1.09 (H) 0.44 - 1.00 mg/dL   Calcium 8.8 (L) 8.9 - 10.3 mg/dL   Total Protein 7.9 6.5 - 8.1 g/dL   Albumin 3.6 3.5 - 5.0 g/dL   AST 17 15 - 41 U/L   ALT 9 (L) 14 - 54 U/L   Alkaline Phosphatase 71 38 - 126 U/L   Total Bilirubin 0.6 0.3 - 1.2 mg/dL   GFR calc non Af Amer 56 (L) >60 mL/min   GFR calc Af Amer >60 >60 mL/min    Comment: (NOTE) The eGFR has been calculated using the CKD EPI equation. This calculation has not been validated in all clinical situations. eGFR's persistently <60 mL/min signify possible Chronic Kidney Disease.    Anion gap 9 5 - 15  Ethanol (ETOH)     Status: None   Collection Time: 03/26/15  4:50 PM  Result Value Ref Range   Alcohol, Ethyl (B) <5 <5 mg/dL    Comment:        LOWEST DETECTABLE LIMIT FOR SERUM ALCOHOL IS 5 mg/dL FOR MEDICAL PURPOSES ONLY   Salicylate level     Status: None   Collection Time: 03/26/15  4:50 PM  Result Value Ref Range   Salicylate Lvl <8.5 2.8 - 30.0 mg/dL  Acetaminophen level     Status: Abnormal   Collection Time: 03/26/15  4:50 PM  Result Value Ref Range   Acetaminophen (Tylenol), Serum <10 (L) 10 - 30 ug/mL    Comment:        THERAPEUTIC CONCENTRATIONS VARY SIGNIFICANTLY. A RANGE OF 10-30 ug/mL  MAY BE AN EFFECTIVE CONCENTRATION FOR MANY PATIENTS. HOWEVER, SOME ARE BEST TREATED AT CONCENTRATIONS OUTSIDE THIS RANGE. ACETAMINOPHEN CONCENTRATIONS >150 ug/mL AT 4 HOURS AFTER INGESTION AND >50 ug/mL AT 12 HOURS AFTER INGESTION ARE OFTEN ASSOCIATED WITH TOXIC REACTIONS.   CBC     Status: Abnormal   Collection Time: 03/26/15  4:50 PM  Result Value Ref  Range   WBC 8.2 3.6 - 11.0 K/uL   RBC 3.65 (L) 3.80 - 5.20 MIL/uL   Hemoglobin 9.5 (L) 12.0 - 16.0 g/dL   HCT 29.4 (L) 35.0 - 47.0 %   MCV 80.5 80.0 - 100.0 fL   MCH 26.2 26.0 - 34.0 pg   MCHC 32.5 32.0 - 36.0 g/dL   RDW 19.1 (H) 11.5 - 14.5 %   Platelets 317 150 - 440 K/uL  Urine Drug Screen, Qualitative ([facility] only)     Status: Abnormal   Collection Time: 03/26/15  4:50 PM  Result Value Ref Range   Tricyclic, Ur Screen NONE DETECTED NONE DETECTED   Amphetamines, Ur Screen NONE DETECTED NONE DETECTED   MDMA (Ecstasy)Ur Screen NONE DETECTED NONE DETECTED   Cocaine Metabolite,Ur [place] NONE DETECTED NONE DETECTED   Opiate, Ur Screen NONE DETECTED NONE DETECTED   Phencyclidine (PCP) Ur S POSITIVE (A) NONE DETECTED   Cannabinoid 50 Ng, Ur [place] NONE DETECTED NONE DETECTED   Barbiturates, Ur Screen NONE DETECTED NONE DETECTED   Benzodiazepine, Ur Scrn POSITIVE (A) NONE DETECTED   Methadone Scn, Ur NONE DETECTED NONE DETECTED    Comment: (NOTE) 854  Tricyclics, urine               Cutoff 1000 ng/mL 200  Amphetamines, urine             Cutoff 1000 ng/mL 300  MDMA (Ecstasy), urine           Cutoff 500 ng/mL 400  Cocaine Metabolite, urine       Cutoff 300 ng/mL 500  Opiate, urine                   Cutoff 300 ng/mL 600  Phencyclidine (PCP), urine      Cutoff 25 ng/mL 700  Cannabinoid, urine              Cutoff 50 ng/mL 800  Barbiturates, urine             Cutoff 200 ng/mL 900  Benzodiazepine, urine           Cutoff 200 ng/mL 1000 Methadone, urine                Cutoff 300 ng/mL 1100 1200 The urine drug screen provides only a  preliminary, unconfirmed 1300 analytical test result and should not be used for non-medical 1400 purposes. Clinical consideration and professional judgment should 1500 be applied to any positive drug screen result due to possible 1600 interfering substances. A more specific alternate chemical method 1700 must be used in order to obtain a confirmed analytical result.  1800 Gas chromato graphy / mass spectrometry (GC/MS) is the preferred 1900 confirmatory method.    No results found.  Review of Systems  Constitutional: Positive for weight loss and malaise/fatigue.  Eyes: Negative.   Respiratory: Negative.   Gastrointestinal: Negative.   Genitourinary: Negative.   Musculoskeletal: Positive for back pain, joint pain and neck pain.  Neurological: Positive for weakness. Negative for headaches.  Psychiatric/Behavioral: Positive for depression and suicidal ideas. The patient is nervous/anxious and has insomnia.     Blood pressure 98/55, pulse 86, temperature 98.4 F (36.9 C), temperature source Oral, resp. rate 18, height 5' 6"  (1.676 m), weight 119 lb (53.978 kg), SpO2 99 %. Physical Exam    Psychiatric Specialty Exam: Physical Exam  Constitutional: She appears well-developed and well-nourished. She appears lethargic. She appears cachectic. She has a sickly appearance.  HENT:  Head:  Normocephalic and atraumatic.  Eyes: Conjunctivae are normal. Pupils are equal, round, and reactive to light.  Neck: Normal range of motion.  Cardiovascular: Normal heart sounds.  Respiratory: Effort normal.  GI: Soft.  Musculoskeletal: Normal range of motion.   Arms: Neurological: She appears lethargic.  Skin: Skin is warm and dry.  Psychiatric: Her affect is blunt. Her speech is delayed. She is withdrawn. Cognition and memory are impaired. She expresses inappropriate judgment. She exhibits a depressed mood. She expresses suicidal ideation.  Chronically ill-appearing woman who is cooperative  with the evaluation. Affect is flat and dysphoric. Mood is stated as depressed. Patient having recent suicidal ideation with intent. Feelings of overwhelming hopelessness. Denies hallucinations.    Review of Systems  Constitutional: Positive for malaise/fatigue.  HENT: Negative.  Eyes: Negative.  Respiratory: Negative.  Cardiovascular: Negative.  Gastrointestinal: Negative.  Musculoskeletal: Positive for joint pain and falls.  Skin: Negative.  Neurological: Positive for focal weakness and weakness.  Psychiatric/Behavioral: Positive for depression and suicidal ideas. The patient is nervous/anxious and has insomnia.    Blood pressure 106/68, pulse 85, temperature 98.3 F (36.8 C), temperature source Oral, resp. rate 16, height 5' 6"  (1.676 m), weight 56.7 kg (125 lb), SpO2 97 %.Body mass index is 20.19 kg/(m^2).  General Appearance: Disheveled  Eye Sport and exercise psychologist:: Fair  Speech: Slow  Volume: Decreased  Mood: Depressed  Affect: Blunt  Thought Process: Goal Directed  Orientation: Full (Time, Place, and Person)  Thought Content: Negative  Suicidal Thoughts: Yes. with intent/plan  Homicidal Thoughts: No  Memory: Immediate; Good Recent; Good Remote; Fair  Judgement: Impaired  Insight: Shallow  Psychomotor Activity: Decreased  Concentration: Poor  Recall: Poor  Fund of Knowledge:Poor  Language: Fair  Akathisia: No  Handed: Right  AIMS (if indicated):    Assets: Armed forces logistics/support/administrative officer Social Support  ADL's: Impaired  Cognition: WNL  Sleep:     Medical Decision Making: Review of Psycho-Social Stressors (1), Review or order clinical lab tests (1), Established Problem, Worsening (2), Review of Medication Regimen & Side Effects (2) and Review of New Medication or Change in Dosage (2)  Treatment Plan Summary: Medication management and Plan Patient with active suicidal ideation and major depression requires readmission to the  hospital. She was prescribed Effexor 150 mg per day as her primary treatment but has been noncompliant. Patient could be a candidate for ECT but I have not brought this up with her at this time.   I will restart all of her medicines including her methotrexate and prednisone that she was on at discharge.   Patient is agreeable to the plan.         Assessment/Plan Pt will be admitted to inpatient [place] [facility] for stabilization and safety. 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  I will continue with the medications and will adjust them to target the symptoms.  Closely monitor the adverse effects, efficacy and therapeutic response of medication. Pt will attend the group and milieu therapy.  Pt will be evaluated by the treatment team on a regular basis to discuss treatment plan and discharge planning.  SW and other staff to help with disposition.     Rainey Pines 03/27/2015, 12:36 PM

## 2015-03-27 NOTE — [facility] Group Notes (Signed)
[facility] LCSW Group Therapy  03/27/2015 3:35 PM  Type of Therapy: Group Therapy  Participation Level: Minimal  Participation Quality: Attentive  Affect: Appropriate  Cognitive: Alert  Insight:Improving   Engagement in Therapy: Improving  Modes of Intervention: Discussion, Education, Socialization and Support  Summary of Progress/Problems: Patients identify obstacles, self-sabotaging and enabling behaviors. Patients explore aspects of self sabotage and enabling and how to limit these self-destructive behaviors in everyday life. Tara Brown attended group and stayed the entire time. She sat quietly and listened to other group members.   Tara Brown L Lindalou Soltis MSW, LCSWA  03/27/2015, 3:35 PM

## 2015-03-27 NOTE — Progress Notes (Signed)
Patient arrives from the ED after voluntarily arriving at the hospital. Patient notes she continues to be depressed and has become hopeless to the point of having a suicide plan in which she will take multiple medications then hang herself. She notes not having enough energy or desire to maintain med compliance, activities of daily living, or any other healthy activity. She is helpless, hopeless, and feels worthless. She notes she does not want to have surgery because she cannot handle the recovery process. She feels unwanted and a burden to her family. She denies HI, AVH, and does contract for safety.

## 2015-03-27 NOTE — Progress Notes (Signed)
A visual assessment of the patient was conducted upon her arrival to the unit with Tresa Moore, RN, as a witness. The skin was observed, noting scabs on her shins and right top of foot. In addition she has a surgical scar on her right knee. She has multiple freckles. No skin breakdown noted. No contraband was noted during the assessment.

## 2015-03-27 NOTE — [facility] Group Notes (Signed)
[facility] Group Notes:  (Nursing/MHT/Case Management/Adjunct)  Date:  03/27/2015  Time:  10:22 AM  Type of Therapy:  Group Therapy  Participation Level:  Did Not Attend  Summary of Progress/Problems:  Tara Brown 03/27/2015, 10:22 AM

## 2015-03-27 NOTE — Progress Notes (Signed)
Denies SI at this time.  Continues to profess to depression.  Rates mood as a 7.  Continues to struggle with pain due to arthritis.  Medication compliant.  No interaction noted with peers.  Safe environment maintained.

## 2015-03-27 NOTE — Progress Notes (Signed)
Initial Nutrition Assessment       INTERVENTION:   Nutrition Supplement therapy: Will add mightyshake BID for added kcals and protein.   NUTRITION DIAGNOSIS:   Inadequate oral intake related to social / environmental circumstances as evidenced by percent weight loss.    GOAL:   Patient will meet greater than or equal to 90% of their needs    MONITOR:    (Energy intake, Anthropometric)  REASON FOR ASSESSMENT:   Malnutrition Screening Tool    ASSESSMENT:   Pt admitted with major depression  Past Medical History  Diagnosis Date  . Osteoporosis   . Psoriatic arthritis   . Anemia   . Depression     Current Nutrition: eating on average 60% of meals per I and O sheet  Food/Nutrition-Related History: poor po intake prior to admission   Medications: fe sulfate, predinsone  Electrolyte/Renal Profile and Glucose Profile:   Recent Labs Lab 03/26/15 1650  NA 132*  K 3.6  CL 100*  CO2 23  BUN 15  CREATININE 1.09*  CALCIUM 8.8*  GLUCOSE 175*   Protein Profile:  Recent Labs Lab 03/26/15 1650  ALBUMIN 3.6      Nutrition-Focused Physical Exam Findings:  Unable to complete Nutrition-Focused physical exam at this time.     Weight Change: 7% weight loss in the last month (wt encounters reviewed 30% weight loss in the past 7 months   Diet Order:  Diet regular Room service appropriate?: Yes; Fluid consistency:: Thin  Skin:  Reviewed, no issues   Height:   Ht Readings from Last 1 Encounters:  03/26/15 5\' 6"  (1.676 m)    Weight:   Wt Readings from Last 1 Encounters:  03/26/15 119 lb (53.978 kg)    Ideal Body Weight:     Wt Readings from Last 10 Encounters:  03/26/15 119 lb (53.978 kg)  03/26/15 125 lb (56.7 kg)  03/11/15 125 lb (56.7 kg)  03/08/15 129 lb 6.6 oz (58.7 kg)  10/07/13 170 lb (77.111 kg)    BMI:  Body mass index is 19.22 kg/(m^2).  Estimated Nutritional Needs:   Kcal:  1350-1620 kcals/d (25-30 kcals/kg)   Protein:   (1.0-1.2 gm/d) 54-65 gm/d  Fluid:  (25-40ml/kg) 1350-1620 kcals/d  EDUCATION NEEDS:   No education needs identified at this time  MODERATE Care Level  Althia Egolf B. 31m, RD, LDN [phone] (pager)

## 2015-03-27 NOTE — Outcomes Assessment (Signed)
Patient notes she has lost approximately 30 pounds in 6 weeks due to lack of interest in food, along with the inability to get motivated to eat when she thinks about it. She notes she is supposed to have a right shoulder replacement but feels the recovery period is too extensive and she cannot tolerate it. She feels lonely and feels she is / will be a burden to her family. Her sister Tara Brown brought her to the ED because of a decline in mood and thoughts of self harm. She notes home health would be helpful, but she does not have those services. She notes she would hopes to explore that option more. She gets Tree surgeon, but notes she barely has enough money to cover her bills.  She is having trouble sleeping, noting she sleeps rarely, and when she does, it is broken sleep. Her affect is flat, and although she contracts for safety she states she thinks it would be easier if she "was just not here".

## 2015-03-27 NOTE — Tx Team (Addendum)
Initial Interdisciplinary Treatment Plan   PATIENT STRESSORS: Health problems Medication change or noncompliance   PATIENT STRENGTHS: Average or above average intelligence Supportive family/friends   PROBLEM LIST: Problem List/Patient Goals Date to be addressed Date deferred Reason deferred Estimated date of resolution  Suicidal 03/27/15   04/03/15  Depression 03/27/15   04/03/15  Chronic Health Problems 03/27/15   04/03/15  Financial limitations 03/27/15   04/03/15                                 DISCHARGE CRITERIA:  Improved stabilization in mood, thinking, and/or behavior Motivation to continue treatment in a less acute level of care  PRELIMINARY DISCHARGE PLAN: Attend aftercare/continuing care group Outpatient therapy  PATIENT/FAMIILY INVOLVEMENT: This treatment plan has been presented to and reviewed with the patient, Tara Brown, and/or family member.  The patient and family have been given the opportunity to ask questions and make suggestions.  Tara Brown 03/27/2015, 5:25 AM
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
## 2015-03-28 NOTE — Plan of Care (Signed)
Problem: Diagnosis: Increased Risk For Suicide Attempt Goal: STG-Patient Will Comply With Medication Regime Outcome: Progressing Pt is taking medications as prescribed  Problem: Spiritual Needs Goal: Ability to function at adequate level Outcome: Progressing Encouraged pt to utilize Child psychotherapist as a means of finding adequate support/accomodations in the community.

## 2015-03-28 NOTE — [facility] Group Notes (Signed)
[facility] Group Notes:  (Nursing/MHT/Case Management/Adjunct)  Date:  03/28/2015  Time:  9:10 AM  Type of Therapy:  goal setting   Participation Level:  Active  Participation Quality:  Appropriate and Supportive  Affect:  Flat  Cognitive:  Appropriate  Insight:  Appropriate  Engagement in Group:  Supportive  Modes of Intervention:  goal setting   Summary of Progress/Problems:  Tara Brown 03/28/2015, 9:10 AM

## 2015-03-28 NOTE — Progress Notes (Signed)
Patient is alert and oriented x 4. She reports depressed mood. Denies SI and feels safe in the hospital. Says that plan to kill herself was "stupid." No evidence of psychotic thinking. Speech is clear and coherent, thoughts are organized and tight.  Patient has been attending groups and is social with select peers. Taking all medications as directed.  Discussed importance of follow up after discharge with patient. She states she knows to take her medications. She says her problem is mostly her inability to manage ADLs independently and being unable to afford a home care aide. Social work is involved with discharge planning. Plan- continue with current treatment plan, monitor mood, safety. Educate coping skills. Encourage independence as permitted by disability. Maintain 15 minute checks for safety.

## 2015-03-28 NOTE — Progress Notes (Signed)
D: Pt is awake and activ ein the milieu this evening. Pt mood is depressed and her affect is sad. Pt denies SI/HI and AVH. Pt is worried about inability to meet ADL's independently.  A: Writer provided emotional support and encouraged her to work closely with her social worker to find an adequate living arraignment after discharge.  R: Pt states that she is motivated to do what she can to participate in tx plan. Pt would also like her Tramadol rescheduled so that all 4 doses occur before bedtime.

## 2015-03-28 NOTE — Progress Notes (Signed)
[facility] MD Progress Note  03/28/2015 6:24 PM Tara Brown  MRN:  [id number] Subjective:    Patient is a 54 year old female who was admitted due to having suicidal ideations. She was recently discharged on July 5. Patient was seen for follow-up. She reported that she is waiting for the Effexor to start working as she wants to be discharged again from the hospital. She reported that she came in as she was becoming hopeless but now she is feeling that there is no point in coming back to this hospital. She reported that she was able to sleep well with the help of the trazodone. She currently denied having any adverse effects of the medication. She reported that she has sisters who are very supportive and she would like to get help from them. She is also taking tramadol 4 times a day for her pain and reported that it has been helpful.   She appeared very much focused on the doses of the tramadol and reported that they are not being given at the right time   Principal Problem: <principal problem not specified> Diagnosis:   Patient Active Problem List   Diagnosis Date Noted  . Major depression [F32.2] 03/26/2015  . Psoriatic arthritis [L40.50] 03/11/2015  . Osteoporosis [M81.0] 03/11/2015  . Major depressive disorder, recurrent, severe without psychotic features [F33.2] 03/11/2015  . Sepsis secondary to UTI [A41.9, N39.0] 03/08/2015  . Leukopenia [D72.819] 03/08/2015  . Anemia of chronic disease [D63.8] 03/08/2015   Total Time spent with patient: 30 minutes   Past Medical History:  Past Medical History  Diagnosis Date  . Osteoporosis   . Psoriatic arthritis   . Anemia   . Depression     Past Surgical History  Procedure Laterality Date  . Hip fracture surgery    . Knee surgery    . Foot surgery    . Hand surgery    . Neck surgery     Family History:  Family History  Problem Relation Age of Onset  . Stroke Neg Hx    Social History:  History  Alcohol Use No     History  Drug  Use No    History   Social History  . Marital Status: Divorced    Spouse Name: N/A  . Number of Children: N/A  . Years of Education: N/A   Social History Main Topics  . Smoking status: Never Smoker   . Smokeless tobacco: Never Used  . Alcohol Use: No  . Drug Use: No  . Sexual Activity: No   Other Topics Concern  . None   Social History Narrative   Additional History:    Sleep: Fair  Appetite:  Fair   Assessment:   Musculoskeletal: Strength & Muscle Tone: decreased Gait & Station: normal Patient leans: N/A   Psychiatric Specialty Exam: Physical Exam  Review of Systems  Constitutional: Negative.   HENT: Negative.   Eyes: Negative.   Cardiovascular: Negative.   Gastrointestinal: Negative.   Musculoskeletal: Positive for myalgias, back pain and joint pain.  Neurological: Negative.   Endo/Heme/Allergies: Negative.   Psychiatric/Behavioral: Positive for depression. The patient is nervous/anxious and has insomnia.     Blood pressure 100/75, pulse 78, temperature 97.9 F (36.6 C), temperature source Oral, resp. rate 20, height 5\' 6"  (1.676 m), weight 119 lb (53.978 kg), SpO2 99 %.Body mass index is 19.22 kg/(m^2).  General Appearance: Casual and Fairly Groomed  ::  Fair  Speech:  Slow  Volume:  Decreased  Mood:  Anxious and Depressed  Affect:  Congruent  Thought Process:  Coherent  Orientation:  Full (Time, Place, and Person)  Thought Content:  WDL  Suicidal Thoughts:  No  Homicidal Thoughts:  No  Memory:  Immediate;   Fair  Judgement:  Impaired  Insight:  Shallow  Psychomotor Activity:  Psychomotor Retardation  Concentration:  Fair  Recall:  [employer] of Knowledge:Fair  Language: Fair  Akathisia:  No  Handed:  Right  AIMS (if indicated):     Assets:  Communication Skills Desire for Improvement Social Support  ADL's:  Intact  Cognition: WNL  Sleep:  Number of Hours: 4.25     Current Medications: Current Facility-Administered  Medications  Medication Dose Route Frequency Provider Last Rate Last Dose  . acetaminophen (TYLENOL) tablet 650 mg  650 mg Oral Q6H PRN Audery Amel, MD   650 mg at 03/27/15 2213  . alum & mag hydroxide-simeth (MAALOX/MYLANTA) 200-200-20 MG/5ML suspension 30 mL  30 mL Oral Q4H PRN Audery Amel, MD      . ferrous sulfate tablet 325 mg  325 mg Oral Q breakfast Audery Amel, MD   325 mg at 03/28/15 0819  . lisinopril (PRINIVIL,ZESTRIL) tablet 20 mg  20 mg Oral Daily Audery Amel, MD   20 mg at 03/28/15 0933  . magnesium hydroxide (MILK OF MAGNESIA) suspension 30 mL  30 mL Oral Daily PRN Audery Amel, MD      . methotrexate (RHEUMATREX) tablet 10 mg  10 mg Oral Weekly Audery Amel, MD   10 mg at 03/27/15 1000  . predniSONE (DELTASONE) tablet 10 mg  10 mg Oral Q breakfast Audery Amel, MD   10 mg at 03/28/15 0819  . traMADol (ULTRAM) tablet 100 mg  100 mg Oral 4 times per day Brandy Hale, MD   100 mg at 03/28/15 1741  . venlafaxine XR (EFFEXOR-XR) 24 hr capsule 150 mg  150 mg Oral Q breakfast Audery Amel, MD   150 mg at 03/28/15 0819  . zolpidem (AMBIEN) tablet 5 mg  5 mg Oral QHS PRN Audery Amel, MD   5 mg at 03/27/15 2119    Lab Results: No results found for this or any previous visit (from the past 48 hour(s)).  Physical Findings: AIMS: Facial and Oral Movements Muscles of Facial Expression: None, normal Lips and Perioral Area: None, normal Jaw: None, normal Tongue: None, normal,Extremity Movements Upper (arms, wrists, hands, fingers): None, normal Lower (legs, knees, ankles, toes): None, normal, Trunk Movements Neck, shoulders, hips: None, normal, Overall Severity Severity of abnormal movements (highest score from questions above): None, normal Incapacitation due to abnormal movements: None, normal Patient's awareness of abnormal movements (rate only patient's report): No Awareness, Dental Status Current problems with teeth and/or dentures?: No Does patient usually  wear dentures?: No  CIWA:    COWS:     Treatment Plan Summary: Daily contact with patient to assess and evaluate symptoms and progress in treatment and Medication management   Medical Decision Making:  Review of Psycho-Social Stressors (1)   Patient will continue on her medications as prescribed.  I will continue with the medications and will adjust them to target the symptoms.  Closely monitor the adverse effects, efficacy and therapeutic response of medication. Pt will attend the group and milieu therapy.  Pt will be evaluated by the treatment team on a regular basis to discuss treatment plan and discharge planning.  SW and other staff to help  with disposition.   Thank you for allowing me to participate in care of this pt.      Brandy Hale 03/28/2015, 6:24 PM

## 2015-03-28 NOTE — [facility] Group Notes (Signed)
[facility] LCSW Group Therapy  03/28/2015 6:52 PM  Type of Therapy:  Group Therapy  Participation Level:  Active   Participation Quality:  Attentive  Affect:  Appropriate  Cognitive:  Alert  Insight:  Improving  Engagement in Therapy:  Improving  Modes of Intervention:  Discussion, Education, Role-play, Socialization and Support  Summary of Progress/Problems:Communications: Patients identify how individuals communicate with one another appropriately and inappropriately. Patients will be guided to discuss their thoughts, feelings, and behaviors related to barriers when communicating. The group will process together ways to execute positive and appropriate communications. Tara Brown attended group and stayed the entire time. She sat quietly and listened to other group members. She discussed how communication has impacted her relationships.  She was supportive of other group members.   [employer] MSW, LCSWA  03/28/2015, 6:52 PM

## 2015-03-29 NOTE — Progress Notes (Signed)
Denies SI.  Verbalizes that she feels much better.  Further states that she is going to start her water exercises once again to help her with her pain.  Medication and group compliant.  Safety maintained.

## 2015-03-29 NOTE — [facility] Group Notes (Signed)
[facility] Group Notes:  (Nursing/MHT/Case Management/Adjunct)  Date:  03/29/2015  Time:  6:07 PM  Type of Therapy:  Psychoeducational Skills  Participation Level:  Active  Participation Quality:  Appropriate, Attentive, Sharing and Supportive  Affect:  Appropriate and Excited  Cognitive:  Alert, Appropriate and Oriented  Insight:  Good  Engagement in Group:  Engaged  Modes of Intervention:  Discussion and Exploration  Summary of Progress/Problems:  Tara Brown 03/29/2015, 6:07 PM

## 2015-03-29 NOTE — Plan of Care (Signed)
Problem: Potential for Suicide Goal: Demonstrates healthy coping skills Outcome: Progressing More visible in the milieu.  Attending groups.  Up to dayroom watching TV with peer.

## 2015-03-29 NOTE — [facility] Group Notes (Signed)
[facility] LCSW Group Therapy  03/29/2015 3:08 PM  Type of Therapy:  Group Therapy  Participation Level:  Active  Participation Quality:  Appropriate  Affect:  Appropriate  Cognitive:  Appropriate  Insight:  Improving  Engagement in Therapy:  Engaged  Modes of Intervention:  Discussion, Education, Exploration and Support  Summary of Progress/Problems::LCSW introduced group rules. The topic of emotional self regulation was introduced. Patients were asked to reflect on negative experiences and outcomes. A group exercise of "If I was five" what would I do to be happy. Patients shared a lot of personal stories and supported each other. Patients were then asked to reflect on healthier alternatives to cope with their negative emotions. This patient shared the story that day dreaming and thinking about winning the lottery was a great stress reducer for her and always took her to that happy place when she was sad.  Tara Brown M 03/29/2015, 3:08 PM

## 2015-03-29 NOTE — [facility] Group Notes (Signed)
[facility] Group Notes:  (Nursing/MHT/Case Management/Adjunct)  Date:  03/29/2015  Time:  3:45 PM  Type of Therapy:  Psychoeducational Skills  Participation Level:  Active  Participation Quality:  Appropriate, Attentive and Sharing  Affect:  Appropriate  Cognitive:  Appropriate and Oriented  Insight:  Good  Engagement in Group:  Engaged  Modes of Intervention:  Discussion, Exploration and Problem-solving  Summary of Progress/Problems:  Foy Guadalajara 03/29/2015, 3:45 PM

## 2015-03-29 NOTE — Plan of Care (Signed)
Problem: Ineffective individual coping Goal: LTG: Patient will report a decrease in negative feelings Outcome: Progressing Verbalizes that she is feeling much better is ready to go home.

## 2015-03-29 NOTE — [facility] Group Notes (Signed)
[facility] Group Notes:  (Nursing/MHT/Case Management/Adjunct)  Date:  03/29/2015  Time:  1:28 AM  Type of Therapy:  Group Therapy  Participation Level:  Active  Participation Quality:  Appropriate and Attentive  Affect:  Appropriate  Cognitive:  Alert and Appropriate  Insight:  Appropriate and Good  Engagement in Group:  Engaged  Modes of Intervention:  Discussion  Summary of Progress/Problems:  Tara Brown Tara Brown 03/29/2015, 1:28 AM

## 2015-03-29 NOTE — [facility] Group Notes (Signed)
[facility] LCSW Aftercare Discharge Planning Group Note  03/29/2015 10:31 AM  Participation Quality:  Appropriate  Affect:  Appropriate  Cognitive:  Appropriate  Insight:  Developing/Improving  Engagement in Group:  Developing/Improving  Modes of Intervention:  Discussion, Education and Support  Summary of Progress/Problems:Patient wants to come to group and to get well and be discharged  Tara Brown 03/29/2015, 10:31 AM

## 2015-03-29 NOTE — Progress Notes (Signed)
D: Pt is awake and active in the milieu this evening. Pt mood is depressed and her affect is flat. Pt is attending groups and is pleasant and cooperative with staff. Pt denies SI/HI and AVH. Pt is expressing some anxiety regarding discharge plan.  A: Writer provided emotional support and administered medications as prescribed.   R: Pt behavior is appropriate on the unit and she is interacting well with staff and peers. Pt went to bed shortly after sevening snack.

## 2015-03-29 NOTE — Progress Notes (Signed)
[facility] MD Progress Note  03/29/2015 11:59 AM Tara Brown  MRN:  [id number] Subjective:    Patient is a 54 year old female who was admitted due to having suicidal ideations.Pt was seen in her room. Today she stated that she is glad that she came to Hospital and that it was the best thing that ever happened to her. She was recently discharged on July 5. Patient was seen for follow-up. She reported that she is waiting for the Effexor to start working as she wants to be discharged again from the hospital. She reported that she came in as she was becoming hopeless but now she is feeling that there is no point in coming back to this hospital. She reported that she was able to sleep well with the help of the trazodone. She currently denied having any adverse effects of the medication. She reported that she has sisters who are very supportive and she would like to get help from them. She is also taking tramadol 4 times a day for her pain and reported that it has been helpful.   She appeared very much focused on the doses of the tramadol and reported that they are not being given at the right time   Principal Problem: <principal problem not specified> Diagnosis:   Patient Active Problem List   Diagnosis Date Noted  . Major depression [F32.2] 03/26/2015  . Psoriatic arthritis [L40.50] 03/11/2015  . Osteoporosis [M81.0] 03/11/2015  . Major depressive disorder, recurrent, severe without psychotic features [F33.2] 03/11/2015  . Sepsis secondary to UTI [A41.9, N39.0] 03/08/2015  . Leukopenia [D72.819] 03/08/2015  . Anemia of chronic disease [D63.8] 03/08/2015   Total Time spent with patient: 30 minutes   Past Medical History:  Past Medical History  Diagnosis Date  . Osteoporosis   . Psoriatic arthritis   . Anemia   . Depression     Past Surgical History  Procedure Laterality Date  . Hip fracture surgery    . Knee surgery    . Foot surgery    . Hand surgery    . Neck surgery     Family  History:  Family History  Problem Relation Age of Onset  . Stroke Neg Hx    Social History:  History  Alcohol Use No     History  Drug Use No    History   Social History  . Marital Status: Divorced    Spouse Name: N/A  . Number of Children: N/A  . Years of Education: N/A   Social History Main Topics  . Smoking status: Never Smoker   . Smokeless tobacco: Never Used  . Alcohol Use: No  . Drug Use: No  . Sexual Activity: No   Other Topics Concern  . None   Social History Narrative   Additional History:    Sleep: Fair  Appetite:  Fair   Assessment:   Musculoskeletal: Strength & Muscle Tone: decreased Gait & Station: normal Patient leans: N/A   Psychiatric Specialty Exam: Physical Exam  Review of Systems  Constitutional: Negative.   HENT: Negative.   Eyes: Negative.   Cardiovascular: Negative.   Gastrointestinal: Negative.   Musculoskeletal: Positive for myalgias, back pain and joint pain.  Neurological: Negative.   Endo/Heme/Allergies: Negative.   Psychiatric/Behavioral: Positive for depression. The patient is nervous/anxious and has insomnia.     Blood pressure 102/70, pulse 76, temperature 97.5 F (36.4 C), temperature source Oral, resp. rate 20, height 5\' 6"  (1.676 m), weight 53.978 kg (119 lb), SpO2 99 %.  Body mass index is 19.22 kg/(m^2).  General Appearance: Casual and Fairly Groomed  Patent attorney::  Fair  Speech:  Slow  Volume:  Decreased  Mood:  Anxious and Depressed  Affect:  Congruent  Thought Process:  Coherent  Orientation:  Full (Time, Place, and Person)  Thought Content:  WDL  Suicidal Thoughts:  No  Homicidal Thoughts:  No  Memory:  Immediate;   Fair  Judgement:  Impaired  Insight:  Shallow  Psychomotor Activity:  Psychomotor Retardation  Concentration:  Fair  Recall:  [employer] of Knowledge:Fair  Language: Fair  Akathisia:  No  Handed:  Right  AIMS (if indicated):     Assets:  Communication Skills Desire for  Improvement Social Support  ADL's:  Intact  Cognition: WNL  Sleep:  Number of Hours: 6     Current Medications: Current Facility-Administered Medications  Medication Dose Route Frequency Provider Last Rate Last Dose  . acetaminophen (TYLENOL) tablet 650 mg  650 mg Oral Q6H PRN Audery Amel, MD   650 mg at 03/27/15 2213  . alum & mag hydroxide-simeth (MAALOX/MYLANTA) 200-200-20 MG/5ML suspension 30 mL  30 mL Oral Q4H PRN Audery Amel, MD      . ferrous sulfate tablet 325 mg  325 mg Oral Q breakfast Audery Amel, MD   325 mg at 03/29/15 0925  . lisinopril (PRINIVIL,ZESTRIL) tablet 20 mg  20 mg Oral Daily Audery Amel, MD   20 mg at 03/29/15 0926  . magnesium hydroxide (MILK OF MAGNESIA) suspension 30 mL  30 mL Oral Daily PRN Audery Amel, MD      . methotrexate (RHEUMATREX) tablet 10 mg  10 mg Oral Weekly Audery Amel, MD   10 mg at 03/27/15 1000  . predniSONE (DELTASONE) tablet 10 mg  10 mg Oral Q breakfast Audery Amel, MD   10 mg at 03/29/15 0926  . traMADol (ULTRAM) tablet 100 mg  100 mg Oral 4 times per day Brandy Hale, MD   100 mg at 03/29/15 0641  . venlafaxine XR (EFFEXOR-XR) 24 hr capsule 150 mg  150 mg Oral Q breakfast Audery Amel, MD   150 mg at 03/29/15 0925  . zolpidem (AMBIEN) tablet 5 mg  5 mg Oral QHS PRN Audery Amel, MD   5 mg at 03/28/15 2114    Lab Results: No results found for this or any previous visit (from the past 48 hour(s)).  Physical Findings: AIMS: Facial and Oral Movements Muscles of Facial Expression: None, normal Lips and Perioral Area: None, normal Jaw: None, normal Tongue: None, normal,Extremity Movements Upper (arms, wrists, hands, fingers): None, normal Lower (legs, knees, ankles, toes): None, normal, Trunk Movements Neck, shoulders, hips: None, normal, Overall Severity Severity of abnormal movements (highest score from questions above): None, normal Incapacitation due to abnormal movements: None, normal Patient's awareness of  abnormal movements (rate only patient's report): No Awareness, Dental Status Current problems with teeth and/or dentures?: No Does patient usually wear dentures?: No  CIWA:    COWS:     Treatment Plan Summary: Daily contact with patient to assess and evaluate symptoms and progress in treatment and Medication management   Medical Decision Making:  Review of Psycho-Social Stressors (1) Continue current meds and consider discharge in the next dew days.  Patient will continue on her medications as prescribed.  I will continue with the medications and will adjust them to target the symptoms.  Closely monitor the adverse effects, efficacy and  therapeutic response of medication. Pt will attend the group and milieu therapy.  Pt will be evaluated by the treatment team on a regular basis to discuss treatment plan and discharge planning.  SW and other staff to help with disposition.   Thank you for allowing me to participate in care of this pt.      Varsha Knock K 03/29/2015, 11:59 AM

## 2015-03-29 NOTE — Plan of Care (Signed)
Problem: Ineffective individual coping Goal: STG: Patient will remain free from self harm Outcome: Progressing Denies suicidal thoughts.

## 2015-03-30 NOTE — Progress Notes (Signed)
Patient alert and oriented x 4. Patient reports depressed mood significantly improved. Denies SI. No evidence of psychotic thinking. Speech is clear and coherent, thoughts are organized and tight.  Patient has been visible in the milieu and is social with select peers. Compliant with all nursing interventions. Taking medications as ordered without adverse effect. Chronic pain being managed with scheduled Tramadol. Patient anxious for discharge, working on discharge plans with family and social work.

## 2015-03-30 NOTE — Progress Notes (Signed)
[facility] MD Progress Note  03/30/2015 11:32 AM Tara Brown  MRN:  [id number] Subjective:  Pt was seen in her room .  Patient is a 54 year old female who was admitted due to having suicidal ideations.Pt was seen in her room. Today she stated that she is glad that she came to Hospital and that it was the best thing that ever happened to her and reports that she lives by herself and is in touch with her sister who comes and visits her sometimes. She was recently discharged on July 5 and came back for re- admission. Patient was seen for follow-up. She reported that she is waiting for the Effexor to start working as she wants to be discharged again from the hospital after dose is adjusted. She reported that she came in as she was becoming hopeless but now she is feeling that there is no point in coming back to this hospital. She reported that she was able to sleep well with the help of the trazodone. She currently denied having any adverse effects of the medication. She reported that she has sisters who are very supportive and she would like to get help from them. She is also taking tramadol 4 times a day for her pain and reported that it has been helpful.  Pt reports that she lives by herself ad  Her sister comes and helps her and is supportive of her.  She appeared very much focused on the doses of the tramadol and reported that they are not being given at the right time   Principal Problem: <principal problem not specified> Diagnosis:   Patient Active Problem List   Diagnosis Date Noted  . Major depression [F32.2] 03/26/2015  . Psoriatic arthritis [L40.50] 03/11/2015  . Osteoporosis [M81.0] 03/11/2015  . Major depressive disorder, recurrent, severe without psychotic features [F33.2] 03/11/2015  . Sepsis secondary to UTI [A41.9, N39.0] 03/08/2015  . Leukopenia [D72.819] 03/08/2015  . Anemia of chronic disease [D63.8] 03/08/2015   Total Time spent with patient: 30 minutes   Past Medical History:   Past Medical History  Diagnosis Date  . Osteoporosis   . Psoriatic arthritis   . Anemia   . Depression     Past Surgical History  Procedure Laterality Date  . Hip fracture surgery    . Knee surgery    . Foot surgery    . Hand surgery    . Neck surgery     Family History:  Family History  Problem Relation Age of Onset  . Stroke Neg Hx    Social History:  History  Alcohol Use No     History  Drug Use No    History   Social History  . Marital Status: Divorced    Spouse Name: N/A  . Number of Children: N/A  . Years of Education: N/A   Social History Main Topics  . Smoking status: Never Smoker   . Smokeless tobacco: Never Used  . Alcohol Use: No  . Drug Use: No  . Sexual Activity: No   Other Topics Concern  . None   Social History Narrative   Additional History:    Sleep: Fair  Appetite:  Fair   Assessment:   Musculoskeletal: Strength & Muscle Tone: decreased Gait & Station: normal Patient leans: N/A   Psychiatric Specialty Exam: Physical Exam  Nursing note and vitals reviewed.   Review of Systems  Constitutional: Negative.   HENT: Negative.   Eyes: Negative.   Cardiovascular: Negative.   Gastrointestinal: Negative.  Musculoskeletal: Positive for myalgias, back pain and joint pain.  Neurological: Negative.   Endo/Heme/Allergies: Negative.   Psychiatric/Behavioral: Positive for depression. The patient is nervous/anxious and has insomnia.     Blood pressure 109/75, pulse 76, temperature 97.2 F (36.2 C), temperature source Oral, resp. rate 20, height 5\' 6"  (1.676 m), weight 53.978 kg (119 lb), SpO2 99 %.Body mass index is 19.22 kg/(m^2).  General Appearance: Casual and Fairly Groomed  ::  Fair  Speech:  Slow  Volume:  Decreased  Mood:  Anxious and Depressed  Affect:  Congruent  Thought Process:  Coherent  Orientation:  Full (Time, Place, and Person)  Thought Content:  WDL  Suicidal Thoughts:  No  Homicidal Thoughts:  No   Memory:  Immediate;   Fair  Judgement:  Impaired  Insight:  Shallow  Psychomotor Activity:  Psychomotor Retardation  Concentration:  Fair  Recall:  [id number] of Knowledge:Fair  Language: Fair  Akathisia:  No  Handed:  Right  AIMS (if indicated):     Assets:  Communication Skills Desire for Improvement Social Support  ADL's:  Intact  Cognition: WNL  Sleep:  Number of Hours: 5.5     Current Medications: Current Facility-Administered Medications  Medication Dose Route Frequency Provider Last Rate Last Dose  . acetaminophen (TYLENOL) tablet 650 mg  650 mg Oral Q6H PRN [employer], MD   650 mg at 03/27/15 2213  . alum & mag hydroxide-simeth (MAALOX/MYLANTA) 200-200-20 MG/5ML suspension 30 mL  30 mL Oral Q4H PRN 03-20-2001, MD      . ferrous sulfate tablet 325 mg  325 mg Oral Q breakfast Audery Amel, MD   325 mg at 03/30/15 0755  . lisinopril (PRINIVIL,ZESTRIL) tablet 20 mg  20 mg Oral Daily 04/01/15, MD   20 mg at 03/30/15 0755  . magnesium hydroxide (MILK OF MAGNESIA) suspension 30 mL  30 mL Oral Daily PRN 04/01/15, MD      . methotrexate (RHEUMATREX) tablet 10 mg  10 mg Oral Weekly Audery Amel, MD   10 mg at 03/27/15 1000  . predniSONE (DELTASONE) tablet 10 mg  10 mg Oral Q breakfast 03/29/15, MD   10 mg at 03/30/15 0755  . traMADol (ULTRAM) tablet 100 mg  100 mg Oral 4 times per day 04/01/15, MD   100 mg at 03/30/15 04/01/15  . venlafaxine XR (EFFEXOR-XR) 24 hr capsule 150 mg  150 mg Oral Q breakfast 3419, MD   150 mg at 03/30/15 0755  . zolpidem (AMBIEN) tablet 5 mg  5 mg Oral QHS PRN 04/01/15, MD   5 mg at 03/29/15 2136    Lab Results: No results found for this or any previous visit (from the past 48 hour(s)).  Physical Findings: AIMS: Facial and Oral Movements Muscles of Facial Expression: None, normal Lips and Perioral Area: None, normal Jaw: None, normal Tongue: None, normal,Extremity Movements Upper (arms, wrists, hands,  fingers): None, normal Lower (legs, knees, ankles, toes): None, normal, Trunk Movements Neck, shoulders, hips: None, normal, Overall Severity Severity of abnormal movements (highest score from questions above): None, normal Incapacitation due to abnormal movements: None, normal Patient's awareness of abnormal movements (rate only patient's report): No Awareness, Dental Status Current problems with teeth and/or dentures?: No Does patient usually wear dentures?: No  CIWA:    COWS:     Treatment Plan Summary: Daily contact with patient to assess and evaluate symptoms  and progress in treatment and Medication management   Medical Decision Making:  Review of Psycho-Social Stressors (1) Continue current meds and consider discharge in the next dew days. Consider adjusting meds after TT today.  Patient will continue on her medications as prescribed.  I will continue with the medications and will adjust them to target the symptoms.  Closely monitor the adverse effects, efficacy and therapeutic response of medication. Pt will attend the group and milieu therapy.  Pt will be evaluated by the treatment team on a regular basis to discuss treatment plan and discharge planning.  SW and other staff to help with disposition.   Thank you for allowing me to participate in care of this pt and supportive therapy and coping skills discussed.      Margarita Rana K 03/30/2015, 11:32 AM

## 2015-03-30 NOTE — Progress Notes (Signed)
Pt napping at change of shifts. Pt up later states she is feeling fine denies suicidal thoughts and wants to go home and plans never to come back. Med compliant. ambien for sleep. Appears to be resting well.

## 2015-03-30 NOTE — [facility] Group Notes (Signed)
[facility] LCSW Group Therapy  03/30/2015 1:27 PM  Type of Therapy:  Group Therapy  Participation Level:  Active  Participation Quality:  Appropriate  Affect:  Appropriate  Cognitive:  Appropriate  Insight:  Engaged  Engagement in Therapy:  Engaged  Modes of Intervention:  Discussion, Education, Exploration and Support  Summary of Progress/Problems:LCSW introduced group rules and  group topic today was introducing Feelings about their diagnosis. Patients were asked to identify their diagnosis and reflect their feelings and reporting 2 symptoms they experience. With peer to peer role plays patients were asked to explain how they would answer questions to friends and neighbors/co-workers and family once they were discharged. Patients were given scenarios and answered which ever fit best for themselves. This patient was able to recognize her symptoms and stated she suffers with depression. Peers were supportive and patient was clear in her thoughts and feeling   Johnella Moloney, Eloy Fehl M 03/30/2015, 1:27 PM

## 2015-03-31 NOTE — Progress Notes (Signed)
Recreation Therapy Notes  Date: 07.20.16 Time: 3:00 pm Location: Craft Room  Group Topic: Self-expression  Goal Area(s) Addresses: Patient will be able to identify a color that represents each emotion. Patient will verbalize benefit of using art as a means of self-expression. Patient will verbalize one positive emotion experienced while participating in activity.   Behavioral Response: Attentive, Interactive  Intervention: The Colors Within Me  Activity: Patients were given a blank face worksheet and instructed to analyze what emotions they are experiencing, pick a color for each emotion, and color how much of that emotion they are experiencing.  Education: LRT educated patients on different forms of self-expression.   Education Outcome: Acknowledges education/In group clarification offered  Clinical Observations/Feedback: Patient completed the worksheet. Patient contributed to group discussion by stating what emotions she was experiencing and that her emotions are dynamic.  [name], LRT/CTRS 03/31/2015 4:19 PM

## 2015-03-31 NOTE — Plan of Care (Signed)
Problem: Potential for Suicide Goal: Contracts not to harm self Outcome: Progressing Denies SI/HI. Contracts for safety, 15 minutes checks maintained.

## 2015-03-31 NOTE — [facility] Group Notes (Signed)
[facility] Group Notes:  (Nursing/MHT/Case Management/Adjunct)  Date:  03/31/2015  Time:  1:47 PM  Type of Therapy:  Psychoeducational Skills  Participation Level:  Active  Participation Quality:  Appropriate, Attentive and Sharing  Affect:  Appropriate  Cognitive:  Alert and Appropriate  Insight:  Appropriate and Good  Engagement in Group:  Engaged  Modes of Intervention:  Discussion, Education and Support  Summary of Progress/Problems:  Tara Brown 03/31/2015, 1:47 PM

## 2015-03-31 NOTE — Plan of Care (Signed)
Problem: Diagnosis: Increased Risk For Suicide Attempt Goal: LTG-Patient Will Show Positive Response to Medication LTG (by discharge) : Patient will show positive response to medication and will participate in the development of the discharge plan.  Outcome: Progressing Denies SI Goal: LTG-Patient Will Report Improved Mood and Deny Suicidal LTG (by discharge) Patient will report improved mood and deny suicidal ideation.  Outcome: Progressing Pt states shes happy, denies SI. Affect brighter.

## 2015-03-31 NOTE — Progress Notes (Signed)
D: Pt denies SI/HI/AVH. Pt is pleasant and cooperative. Pt appears less anxious and is interacting with peers and staff appropriately.  A: Pt was offered support and encouragement. Pt was given scheduled medications. Pt was encouraged to attend groups. Q 15 minute checks were done for safety.  R:Pt attends groups and interacts well with peers and staff. Pt is taking medication. Pt has no complaints.Pt receptive to treatment and safety maintained on unit.

## 2015-03-31 NOTE — Progress Notes (Signed)
Patient alert and oriented x 4. Patient denies SI. No evidence of psychotic thinking. Speech is clear and coherent, thoughts are organized. Patient has been visible in the milieu. Med and group compliant. Taking medications as ordered without adverse effect. Chronic pain being managed with scheduled Tramadol. Encouragement and support offered. Pt continues to be monitored for safety.

## 2015-03-31 NOTE — Progress Notes (Signed)
[facility] MD Progress Note  03/31/2015 12:33 PM Tara Brown  MRN:  [id number] Subjective:  Pt was seen in her room .  Patient is a 54 year old female who was admitted due to having suicidal ideations.Pt was seen in her room. Today she stated that she is glad that she came to Hospital and that it was the best thing that ever happened to her and reports that she lives by herself and is in touch with her sister who comes and visits her sometimes. She was recently discharged on July 5 and came back for re- admission. Patient was discussed in TT and will probably be considered for D/C before end of this week and she was glad to know that.She reported that she came in as she was becoming hopeless but now she is feeling that there is no point in coming back to this hospital. She reported that she was able to sleep well with the help of the trazodone. She currently denied having any adverse effects of the medication. She reported that she has sisters who are very supportive and she would like to get help from them. She is also taking tramadol 4 times a day for her pain and reported that it has been helpful.  Pt reports that she lives by herself ad  Her sister comes and helps her and is supportive of her.  She appeared very much focused on the doses of the tramadol and reported that they are not being given at the right time   Principal Problem: <principal problem not specified> Diagnosis:   Patient Active Problem List   Diagnosis Date Noted  . Major depression [F32.2] 03/26/2015  . Psoriatic arthritis [L40.50] 03/11/2015  . Osteoporosis [M81.0] 03/11/2015  . Major depressive disorder, recurrent, severe without psychotic features [F33.2] 03/11/2015  . Sepsis secondary to UTI [A41.9, N39.0] 03/08/2015  . Leukopenia [D72.819] 03/08/2015  . Anemia of chronic disease [D63.8] 03/08/2015   Total Time spent with patient: 30 minutes   Past Medical History:  Past Medical History  Diagnosis Date  .  Osteoporosis   . Psoriatic arthritis   . Anemia   . Depression     Past Surgical History  Procedure Laterality Date  . Hip fracture surgery    . Knee surgery    . Foot surgery    . Hand surgery    . Neck surgery     Family History:  Family History  Problem Relation Age of Onset  . Stroke Neg Hx    Social History:  History  Alcohol Use No     History  Drug Use No    History   Social History  . Marital Status: Divorced    Spouse Name: N/A  . Number of Children: N/A  . Years of Education: N/A   Social History Main Topics  . Smoking status: Never Smoker   . Smokeless tobacco: Never Used  . Alcohol Use: No  . Drug Use: No  . Sexual Activity: No   Other Topics Concern  . None   Social History Narrative   Additional History:    Sleep: Fair  Appetite:  Fair   Assessment: Stable and improving.  Musculoskeletal: Strength & Muscle Tone: decreased Gait & Station: normal Patient leans: N/A   Psychiatric Specialty Exam: Physical Exam  Nursing note and vitals reviewed.   Review of Systems  Constitutional: Negative.   HENT: Negative.   Eyes: Negative.   Cardiovascular: Negative.   Gastrointestinal: Negative.   Musculoskeletal: Positive for myalgias,  back pain and joint pain.  Neurological: Negative.   Endo/Heme/Allergies: Negative.   Psychiatric/Behavioral: Positive for depression. The patient is nervous/anxious and has insomnia.   All other systems reviewed and are negative.   Blood pressure 112/76, pulse 72, temperature 98.2 F (36.8 C), temperature source Oral, resp. rate 20, height 5\' 6"  (1.676 m), weight 53.978 kg (119 lb), SpO2 99 %.Body mass index is 19.22 kg/(m^2).  General Appearance: Casual and Fairly Groomed  ::  Fair  Speech:  Slow  Volume:  Decreased  Mood:  Anxious and Depressed  Affect:  Congruent  Thought Process:  Coherent  Orientation:  Full (Time, Place, and Person)  Thought Content:  WDL  Suicidal Thoughts:  No   Homicidal Thoughts:  No  Memory:  Immediate;   Fair  Judgement:  Impaired  Insight:  Shallow  Psychomotor Activity:  Psychomotor Retardation  Concentration:  Fair  Recall:  [id number] of Knowledge:Fair  Language: Fair  Akathisia:  No  Handed:  Right  AIMS (if indicated):     Assets:  Communication Skills Desire for Improvement Social Support  ADL's:  Intact  Cognition: WNL  Sleep:  Number of Hours: 7.25     Current Medications: Current Facility-Administered Medications  Medication Dose Route Frequency Provider Last Rate Last Dose  . acetaminophen (TYLENOL) tablet 650 mg  650 mg Oral Q6H PRN [employer], MD   650 mg at 03/27/15 2213  . alum & mag hydroxide-simeth (MAALOX/MYLANTA) 200-200-20 MG/5ML suspension 30 mL  30 mL Oral Q4H PRN 03-20-2001, MD      . ferrous sulfate tablet 325 mg  325 mg Oral Q breakfast Audery Amel, MD   325 mg at 03/31/15 0827  . lisinopril (PRINIVIL,ZESTRIL) tablet 20 mg  20 mg Oral Daily 04/02/15, MD   20 mg at 03/31/15 0827  . magnesium hydroxide (MILK OF MAGNESIA) suspension 30 mL  30 mL Oral Daily PRN 04/02/15, MD      . methotrexate (RHEUMATREX) tablet 10 mg  10 mg Oral Weekly Audery Amel, MD   10 mg at 03/27/15 1000  . predniSONE (DELTASONE) tablet 10 mg  10 mg Oral Q breakfast 03/29/15, MD   10 mg at 03/31/15 0827  . traMADol (ULTRAM) tablet 100 mg  100 mg Oral 4 times per day 04/02/15, MD   100 mg at 03/31/15 0651  . venlafaxine XR (EFFEXOR-XR) 24 hr capsule 150 mg  150 mg Oral Q breakfast 04/02/15, MD   150 mg at 03/31/15 0827  . zolpidem (AMBIEN) tablet 5 mg  5 mg Oral QHS PRN 04/02/15, MD   5 mg at 03/30/15 2132    Lab Results: No results found for this or any previous visit (from the past 48 hour(s)).  Physical Findings: AIMS: Facial and Oral Movements Muscles of Facial Expression: None, normal Lips and Perioral Area: None, normal Jaw: None, normal Tongue: None, normal,Extremity  Movements Upper (arms, wrists, hands, fingers): None, normal Lower (legs, knees, ankles, toes): None, normal, Trunk Movements Neck, shoulders, hips: None, normal, Overall Severity Severity of abnormal movements (highest score from questions above): None, normal Incapacitation due to abnormal movements: None, normal Patient's awareness of abnormal movements (rate only patient's report): No Awareness, Dental Status Current problems with teeth and/or dentures?: No Does patient usually wear dentures?: No  CIWA:    COWS:     Treatment Plan Summary: Daily contact with patient to assess  and evaluate symptoms and progress in treatment and Medication management   Medical Decision Making:  Review of Psycho-Social Stressors (1) Continue current meds and consider discharge in the next dew days. Consider adjusting meds after TT today.  Patient will continue on her medications as prescribed.  I will continue with the medications and will adjust them to target the symptoms.  Closely monitor the adverse effects, efficacy and therapeutic response of medication. Pt will attend the group and milieu therapy.  Pt will be evaluated by the treatment team on a regular basis to discuss treatment plan and discharge planning.  SW and other staff to help with disposition.  Pt is being considered for D/C in the next few days.     Beau Fanny 03/31/2015, 12:33 PM

## 2015-04-01 ENCOUNTER — Encounter: Payer: Self-pay | Admitting: Psychiatry

## 2015-04-01 MED ORDER — VENLAFAXINE HCL ER 150 MG PO CP24: 150.0000 mg | ORAL_CAPSULE | Freq: Every day | ORAL | Status: DC

## 2015-04-01 MED ORDER — LISINOPRIL 20 MG PO TABS
20.0000 mg | ORAL_TABLET | Freq: Every day | ORAL | Status: DC
Start: 2015-04-01 — End: 2015-08-07

## 2015-04-01 MED ORDER — PREDNISONE 10 MG PO TABS: 10.0000 mg | ORAL_TABLET | Freq: Every day | ORAL | Status: DC

## 2015-04-01 MED ORDER — ZOLPIDEM TARTRATE 5 MG PO TABS: 5.0000 mg | ORAL_TABLET | Freq: Every evening | ORAL | Status: DC | PRN

## 2015-04-01 NOTE — Progress Notes (Signed)
AVS H&P Discharge Summary faxed to [facility] for hospital follow-up °

## 2015-04-01 NOTE — Plan of Care (Signed)
Problem: Ineffective individual coping Goal: STG: Patient will remain free from self harm Outcome: Progressing Denies SI/HI.

## 2015-04-01 NOTE — BHH Suicide Risk Assessment (Signed)
[facility] Discharge Suicide Risk Assessment   Demographic Factors:  Caucasian female  lives by herself in an apt and sister vists her often.  Total Time spent with patient: 45 minutes  Musculoskeletal: Strength & Muscle Tone: Arthritic. Gait & Station: normal Patient leans: N/A  Psychiatric Specialty Exam: Physical Exam  Nursing note and vitals reviewed.   ROS  Blood pressure 136/64, pulse 61, temperature 97.9 F (36.6 C), temperature source Oral, resp. rate 20, height 5\' 6"  (1.676 m), weight 53.978 kg (119 lb), SpO2 99 %.Body mass index is 19.22 kg/(m^2).  General Appearance: Casual  Eye Contact::  Good  Speech:  Normal Rate409  Volume:  Normal  Mood:  Euthymic  Affect:  Appropriate  Thought Process:  Negative  Orientation:  Full (Time, Place, and Person)  Thought Content:  WDL  Suicidal Thoughts:  No  Homicidal Thoughts:  No  Memory:  Immediate;   Fair Recent;   Fair Remote;   Fair adequate  Judgement:  Intact  Insight:  Fair  Psychomotor Activity:  Normal  Concentration:  Fair  Recall:  [id number] of Knowledge:Fair  Language: Fair  Akathisia:  No  Handed:  Right  AIMS (if indicated):     Assets:  Communication Skills Desire for Improvement Housing Social Support Vocational/Educational  Sleep:  Number of Hours: 5.5  Cognition: WNL  ADL's:  Intact   Have you used any form of tobacco in the last 30 days? (Cigarettes, Smokeless Tobacco, Cigars, and/or Pipes): No  Has this patient used any form of tobacco in the last 30 days? (Cigarettes, Smokeless Tobacco, Cigars, and/or Pipes) No  Mental Status Per Nursing Assessment::   On Admission:  Suicide plan  Current Mental Status by Physician: Pt is alert and ox3 Calm and co-operative. No agitation. No psychosis. Denies s/h ideas ior plans and contracts for safetry and is eager to go home and get help from home health care and sister will vist her often and help her as needed.  Loss Factors: NA  Historical  Factors: Please review H/P.  Risk Reduction Factors:   Positive coping skills or problem solving skills  Continued Clinical Symptoms: Resolved and pt is doing better and feeling better.   Cognitive Features That Contribute To Risk:  None    Suicide Risk:  Minimal: No identifiable suicidal ideation.  Patients presenting with no risk factors but with morbid ruminations; may be classified as minimal risk based on the severity of the depressive symptoms  Principal Problem: <principal problem not specified> Discharge Diagnoses:  Patient Active Problem List   Diagnosis Date Noted  . Major depression [F32.2] 03/26/2015  . Psoriatic arthritis [L40.50] 03/11/2015  . Osteoporosis [M81.0] 03/11/2015  . Major depressive disorder, recurrent, severe without psychotic features [F33.2] 03/11/2015  . Sepsis secondary to UTI [A41.9, N39.0] 03/08/2015  . Leukopenia [D72.819] 03/08/2015  . Anemia of chronic disease [D63.8] 03/08/2015    Follow-up Information    Follow up with [facility]. Go on 04/08/2015.   Why:  11:30am , Hospital Follow up, Outpatient Medication Management, Therapy, Please take your photo ID and Insurance card, Please reschedule if unable to make appointment.   Contact information:   [address] [place] Phone:[phone] or [phone] [phone]      Plan Of Care/Follow-up recommendations:  Activity:  as tolerated  Is patient on multiple antipsychotic therapies at discharge:  No   Has Patient had three or more failed trials of antipsychotic monotherapy by history:  No  Recommended Plan for Multiple Antipsychotic Therapies: NA    Hendel Gatliff K 04/01/2015, 11:37 AM

## 2015-04-01 NOTE — Progress Notes (Signed)
D: Pt denies SI/HI/AVH. Pt is pleasant and cooperative. Pt stated she feels better from interacting with peers and attending group meeting   A: Pt was offered support and encouragement. Pt was given scheduled medications. Pt was encouraged to attend groups. Q 15 minute checks were done for safety.  R:Pt attends groups and interacts well with peers and staff. Pt is taking medication. Pt has no complaints.Pt receptive to treatment and safety maintained on unit.

## 2015-04-01 NOTE — BHH Group Notes (Signed)
BHH Group Notes:  (Nursing/MHT/Case Management/Adjunct)  Date:  04/01/2015  Time:  2:26 AM  Type of Therapy:  Group Therapy  Participation Level:  Active  Participation Quality:  Appropriate and Attentive  Affect:  Appropriate  Cognitive:  Alert and Appropriate  Insight:  Appropriate  Engagement in Group:  Engaged  Modes of Intervention:  Discussion  Summary of Progress/Problems:  Jayle Solarz Joy Jalene Lacko 04/01/2015, 2:26 AM

## 2015-04-01 NOTE — Progress Notes (Signed)
Pt discharged home. DC instructions provided and explained, medications reviewed. Rx given. All questions answered. Pt stable at discharge. Denies SI, HI, AVH.

## 2015-04-01 NOTE — Discharge Summary (Signed)
Physician Discharge Summary Note  Patient:  Tara Brown is an 54 y.o., female MRN:  [id number] DOB:  11-Jun-1961 Patient phone:  [phone] (home)  Patient address:   [place] [address],  Total Time spent with patient: 45 minutes  Date of Admission:  03/26/2015 Date of Discharge: 04/01/2015  Reason for Admission:  Pt lives by herself and was getting depressed with suicidal ideas.  Principal Problem: <principal problem not specified> Discharge Diagnoses: Patient Active Problem List   Diagnosis Date Noted  . Major depression [F32.2] 03/26/2015  . Psoriatic arthritis [L40.50] 03/11/2015  . Osteoporosis [M81.0] 03/11/2015  . Major depressive disorder, recurrent, severe without psychotic features [F33.2] 03/11/2015  . Sepsis secondary to UTI [A41.9, N39.0] 03/08/2015  . Leukopenia [D72.819] 03/08/2015  . Anemia of chronic disease [D63.8] 03/08/2015    Musculoskeletal: Strength & Muscle Tone: decreased due to arthritis. Gait & Station: normal Patient leans: N/A  Psychiatric Specialty Exam: Physical Exam  Nursing note and vitals reviewed.   ROS  Blood pressure 136/64, pulse 61, temperature 97.9 F (36.6 C), temperature source Oral, resp. rate 20, height 5' 6"  (1.676 m), weight 53.978 kg (119 lb), SpO2 99 %.Body mass index is 19.22 kg/(m^2).  Have you used any form of tobacco in the last 30 days? (Cigarettes, Smokeless Tobacco, Cigars, and/or Pipes): No  Has this patient used any form of tobacco in the last 30 days? (Cigarettes, Smokeless Tobacco, Cigars, and/or Pipes) See H/P. Please review SRA. ie Suicide Risk Assessment.  Past Medical History:  Past Medical History  Diagnosis Date  . Osteoporosis   . Psoriatic arthritis   . Anemia   . Depression     Past Surgical History  Procedure Laterality Date  . Hip fracture surgery    . Knee surgery    . Foot surgery    . Hand surgery    . Neck surgery     Family History:  Family History   Problem Relation Age of Onset  . Stroke Neg Hx    Social History:  History  Alcohol Use No     History  Drug Use No    History   Social History  . Marital Status: Divorced    Spouse Name: N/A  . Number of Children: N/A  . Years of Education: N/A   Social History Main Topics  . Smoking status: Never Smoker   . Smokeless tobacco: Never Used  . Alcohol Use: No  . Drug Use: No  . Sexual Activity: No   Other Topics Concern  . None   Social History Narrative    Past Psychiatric History: Hospitalizations:  Outpatient Care:  Substance Abuse Care:  Self-Mutilation:  Suicidal Attempts:  Violent Behaviors:   Risk to Self: Is patient at risk for suicide?: Yes Risk to Others:   Prior Inpatient Therapy:   Prior Outpatient Therapy:    Level of Care:  OP  Hospital Course:  Improved.  Consults:  None  Significant Diagnostic Studies:  None  Discharge Vitals:   Blood pressure 136/64, pulse 61, temperature 97.9 F (36.6 C), temperature source Oral, resp. rate 20, height 5' 6"  (1.676 m), weight 53.978 kg (119 lb), SpO2 99 %. Body mass index is 19.22 kg/(m^2). Lab Results:   No results found for this or any previous visit (from the past 72 hour(s)).  Physical Findings: AIMS: Facial and Oral Movements Muscles of Facial Expression: None, normal Lips and Perioral Area: None, normal Jaw: None, normal Tongue: None,  normal,Extremity Movements Upper (arms, wrists, hands, fingers): None, normal Lower (legs, knees, ankles, toes): None, normal, Trunk Movements Neck, shoulders, hips: None, normal, Overall Severity Severity of abnormal movements (highest score from questions above): None, normal Incapacitation due to abnormal movements: None, normal Patient's awareness of abnormal movements (rate only patient's report): No Awareness, Dental Status Current problems with teeth and/or dentures?: No Does patient usually wear dentures?: No  CIWA:    COWS:      See Psychiatric  Specialty Exam and Suicide Risk Assessment completed by Attending Physician prior to discharge.  Discharge destination:  Home  Is patient on multiple antipsychotic therapies at discharge:  No   Has Patient had three or more failed trials of antipsychotic monotherapy by history:  No    Recommended Plan for Multiple Antipsychotic Therapies: NA     Medication List    STOP taking these medications        calcium carbonate 500 MG chewable tablet  Commonly known as:  TUMS - dosed in mg elemental calcium     CIMZIA PREFILLED 2 X 200 MG/ML Kit  Generic drug:  Certolizumab Pegol     ferrous sulfate 325 (65 FE) MG tablet     lidocaine 4 % external solution  Commonly known as:  XYLOCAINE      TAKE these medications      Indication   escitalopram 20 MG tablet  Commonly known as:  LEXAPRO  Take 20 mg by mouth daily.      folic acid 1 MG tablet  Commonly known as:  FOLVITE  Take 1 mg by mouth daily.      lisinopril 20 MG tablet  Commonly known as:  PRINIVIL,ZESTRIL  Take 1 tablet (20 mg total) by mouth daily.      predniSONE 10 MG tablet  Commonly known as:  DELTASONE  Take 1 tablet (10 mg total) by mouth daily with breakfast.      venlafaxine XR 150 MG 24 hr capsule  Commonly known as:  EFFEXOR-XR  Take 1 capsule (150 mg total) by mouth daily with breakfast.      zolpidem 5 MG tablet  Commonly known as:  AMBIEN  Take 1 tablet (5 mg total) by mouth at bedtime as needed for sleep.       ASK your doctor about these medications      Indication   methotrexate 2.5 MG tablet  Commonly known as:  RHEUMATREX  Take 10 mg by mouth once a week. Caution:Chemotherapy. Protect from light.      PRILOSEC PO  Take by mouth.      traMADol 50 MG tablet  Commonly known as:  ULTRAM  Take 2 tablets (100 mg total) by mouth 4 (four) times daily -  with meals and at bedtime.            Follow-up Information    Follow up with [facility]. Go on 04/08/2015.    Why:  11:30am , Hospital Follow up, Outpatient Medication Management, Therapy, Please take your photo ID and Insurance card, Please reschedule if unable to make appointment.   Contact information:   [place] or [phone] Fax:[phone]      Follow-up recommendations:  Other:  as tolerated  Comments:  Pt will get help from [facility] and will be complaint with meds and follow up appts.  Total Discharge Time: 45 minutes  Signed: Dewain Penning 04/01/2015, 11:44 AM

## 2015-04-01 NOTE — Progress Notes (Signed)
  [facility] Adult Case Management Discharge Plan :  Will you be returning to the same living situation after discharge:  Yes,  Home  At discharge, do you have transportation home?: Yes,  Sister  Do you have the ability to pay for your medications: Yes,  Insurance   Release of information consent forms completed and in the chart;  Patient's signature needed at discharge.  Patient to Follow up at: Follow-up Information    Follow up with [facility]. Go on 04/08/2015.   Why:  11:30am , Hospital Follow up, Outpatient Medication Management, Therapy, Please take your photo ID and Insurance card, Please reschedule if unable to make appointment.   Contact information:   [address] [place] Phone:[phone] or [phone] [phone]      Patient denies SI/HI: Yes,  Yes ]    Safety Planning and Suicide Prevention discussed: Yes,  With sister and patient   Have you used any form of tobacco in the last 30 days? (Cigarettes, Smokeless Tobacco, Cigars, and/or Pipes): No  Has patient been referred to the Quitline?: N/A patient is not a smoker  [employer] MSW, LCSWA  04/01/2015, 4:21 PM

## 2015-04-02 NOTE — Progress Notes (Signed)
Patient ID: Tara Brown, female   DOB: 08/24/1961, 54 y.o.   MRN: [id number]  CSW accessed Pt's chart to obtain information needed for home health needs. Information faxed to [facility].   Rondall Allegra, MSW, [facility]  04/02/2015 1:46 PM

## 2015-08-07 ENCOUNTER — Encounter (HOSPITAL_BASED_OUTPATIENT_CLINIC_OR_DEPARTMENT_OTHER): Payer: Self-pay | Admitting: Emergency Medicine

## 2015-08-07 ENCOUNTER — Emergency Department (EMERGENCY_DEPARTMENT_HOSPITAL)
Admission: EM | Admit: 2015-08-07 | Discharge: 2015-08-09 | Disposition: A | Discharge status: Other Acute Inpatient Hospital | Payer: Medicare Other | Source: Home / Self Care | Attending: Emergency Medicine | Admitting: Emergency Medicine

## 2015-08-07 DIAGNOSIS — N390 Urinary tract infection, site not specified: Secondary | ICD-10-CM

## 2015-08-07 DIAGNOSIS — M79672 Pain in left foot: Secondary | ICD-10-CM | POA: Diagnosis not present

## 2015-08-07 DIAGNOSIS — E876 Hypokalemia: Secondary | ICD-10-CM

## 2015-08-07 DIAGNOSIS — F332 Major depressive disorder, recurrent severe without psychotic features: Secondary | ICD-10-CM | POA: Insufficient documentation

## 2015-08-07 DIAGNOSIS — L4050 Arthropathic psoriasis, unspecified: Secondary | ICD-10-CM

## 2015-08-07 DIAGNOSIS — D649 Anemia, unspecified: Secondary | ICD-10-CM

## 2015-08-07 DIAGNOSIS — R45851 Suicidal ideations: Secondary | ICD-10-CM

## 2015-08-07 LAB — PREGNANCY, URINE: PREG TEST UR: NEGATIVE

## 2015-08-07 LAB — CBC WITH DIFFERENTIAL/PLATELET
Basophils Absolute: 0 10*3/uL (ref 0.0–0.1)
Basophils Relative: 0 %
EOS PCT: 1 %
Eosinophils Absolute: 0.1 10*3/uL (ref 0.0–0.7)
HCT: 27.8 % — ABNORMAL LOW (ref 36.0–46.0)
Hemoglobin: 8.3 g/dL — ABNORMAL LOW (ref 12.0–15.0)
LYMPHS ABS: 1.6 10*3/uL (ref 0.7–4.0)
LYMPHS PCT: 23 %
MCH: 22.4 pg — AB (ref 26.0–34.0)
MCHC: 29.9 g/dL — ABNORMAL LOW (ref 30.0–36.0)
MCV: 75.1 fL — AB (ref 78.0–100.0)
MONOS PCT: 7 %
Monocytes Absolute: 0.5 10*3/uL (ref 0.1–1.0)
Neutro Abs: 4.7 10*3/uL (ref 1.7–7.7)
Neutrophils Relative %: 69 %
PLATELETS: 387 10*3/uL (ref 150–400)
RBC: 3.7 MIL/uL — AB (ref 3.87–5.11)
RDW: 17.4 % — ABNORMAL HIGH (ref 11.5–15.5)
WBC: 6.9 10*3/uL (ref 4.0–10.5)

## 2015-08-07 LAB — RAPID URINE DRUG SCREEN, HOSP PERFORMED
Amphetamines: NOT DETECTED
Barbiturates: NOT DETECTED
Benzodiazepines: NOT DETECTED
Cocaine: NOT DETECTED
Opiates: NOT DETECTED
Tetrahydrocannabinol: NOT DETECTED

## 2015-08-07 LAB — URINE MICROSCOPIC-ADD ON

## 2015-08-07 LAB — COMPREHENSIVE METABOLIC PANEL
ALBUMIN: 3 g/dL — AB (ref 3.5–5.0)
ALT: 6 U/L — ABNORMAL LOW (ref 14–54)
AST: 16 U/L (ref 15–41)
Alkaline Phosphatase: 73 U/L (ref 38–126)
Anion gap: 11 (ref 5–15)
BUN: 10 mg/dL (ref 6–20)
CHLORIDE: 101 mmol/L (ref 101–111)
CO2: 23 mmol/L (ref 22–32)
CREATININE: 0.96 mg/dL (ref 0.44–1.00)
Calcium: 9 mg/dL (ref 8.9–10.3)
GFR calc Af Amer: >60 mL/min (ref >60)
GFR calc non Af Amer: >60 mL/min (ref >60)
Glucose, Bld: 192 mg/dL — ABNORMAL HIGH (ref 65–99)
POTASSIUM: 3.1 mmol/L — AB (ref 3.5–5.1)
Sodium: 135 mmol/L (ref 135–145)
Total Bilirubin: 0.5 mg/dL (ref 0.3–1.2)
Total Protein: 7.8 g/dL (ref 6.5–8.1)

## 2015-08-07 LAB — URINALYSIS, ROUTINE W REFLEX MICROSCOPIC
Glucose, UA: NEGATIVE mg/dL
KETONES UR: 15 mg/dL — AB
NITRITE: NEGATIVE
Protein, ur: 100 mg/dL — AB
SPECIFIC GRAVITY, URINE: 1.021 (ref 1.005–1.030)
pH: 6 (ref 5.0–8.0)

## 2015-08-07 LAB — ETHANOL: Alcohol, Ethyl (B): <5 mg/dL (ref <5)

## 2015-08-07 LAB — TROPONIN I

## 2015-08-07 MED ORDER — ZOLPIDEM TARTRATE 5 MG PO TABS
5.0000 mg | ORAL_TABLET | Freq: Every evening | ORAL | Status: DC | PRN
  Administered 2015-08-07 – 2015-08-09 (×2): 5 mg via ORAL
  Filled 2015-08-07 (×3): qty 1

## 2015-08-07 MED ORDER — POTASSIUM CHLORIDE CRYS ER 20 MEQ PO TBCR
40.0000 meq | EXTENDED_RELEASE_TABLET | Freq: Two times a day (BID) | ORAL | Status: DC
  Administered 2015-08-07 – 2015-08-08 (×3): 40 meq via ORAL
  Filled 2015-08-07 (×4): qty 2

## 2015-08-07 MED ORDER — TRAMADOL HCL 50 MG PO TABS
50.0000 mg | ORAL_TABLET | Freq: Four times a day (QID) | ORAL | Status: DC | PRN
  Administered 2015-08-07 – 2015-08-08 (×5): 50 mg via ORAL
  Filled 2015-08-07 (×5): qty 1

## 2015-08-07 MED ORDER — CEPHALEXIN 500 MG PO CAPS
500.0000 mg | ORAL_CAPSULE | Freq: Three times a day (TID) | ORAL | Status: DC
  Administered 2015-08-07 – 2015-08-08 (×5): 500 mg via ORAL
  Filled 2015-08-07 (×4): qty 1
  Filled 2015-08-07: qty 2

## 2015-08-07 MED ORDER — ESCITALOPRAM OXALATE 10 MG PO TABS
20.0000 mg | ORAL_TABLET | Freq: Every day | ORAL | Status: DC
  Administered 2015-08-07 – 2015-08-08 (×2): 20 mg via ORAL
  Filled 2015-08-07 (×2): qty 2
  Filled 2015-08-07: qty 1

## 2015-08-07 MED ORDER — LISINOPRIL 10 MG PO TABS
10.0000 mg | ORAL_TABLET | Freq: Every day | ORAL | Status: DC
  Administered 2015-08-08: 10 mg via ORAL
  Filled 2015-08-07 (×2): qty 1

## 2015-08-07 NOTE — ED Notes (Signed)
Pt on monitor with TTS Emily at present time.

## 2015-08-07 NOTE — ED Notes (Signed)
Report called to Georganna Skeans RN at [facility] ED .

## 2015-08-07 NOTE — ED Notes (Signed)
Called staffing around 11:04am to attempt to get a sitter for patient. There was no sitter available per patient.

## 2015-08-07 NOTE — ED Provider Notes (Signed)
Filed Vitals:   08/07/15 1744 08/07/15 1901  BP: 113/65 98/76  Pulse: 77 99  Temp:  99.2 F (37.3 C)  Resp: 16 15   Patient is transferred from [facility] [place]. She presents with suicidal ideations. She is currently awaiting inpatient placement. She also has some chronic health issues including chronic anemia. 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 She states this is making it difficult for her to home. She is also having difficulty with ambulation. She lives at home by herself in an apartment. 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 The wounds are currently dressed. She is on Keflex for UTI. She currently is denying any complaints.  Rolan Bucco, MD 08/07/15 [phone]

## 2015-08-07 NOTE — Progress Notes (Signed)
Disposition CSW has completed patient referrals to the following inpatient Geri[facility]Psych facilities:  [facility], [place] [facility]. [facility]  CSW will continue to follow patient for placement needs.  Seward Speck [facility] Disposition CSW [phone]

## 2015-08-07 NOTE — ED Notes (Signed)
Pt reports with foot ulcer to left followed by dr. Sherilyn Cooter in [place], pt states that she plans to just starve herself to death, per family she is half the size she was 6 months ago

## 2015-08-07 NOTE — ED Notes (Signed)
Patient pleasant and cooperative on assessment. Pt states she is depressed about her declining health status. Pt states "I quit eating because I don't want to live like this". Pt with dull, flat affect and endorsing depression. Pt compliant with HS medications. Dr. Gilmore Laroche in room to speak to patient.

## 2015-08-07 NOTE — ED Provider Notes (Signed)
CSN: [id number]     Arrival date & time 08/07/15  0907 History   First MD Initiated Contact with Patient 08/07/15 [phone]     Chief Complaint  Patient presents with  . Depression  . Suicidal     (Consider location/radiation/quality/duration/timing/severity/associated sxs/prior Treatment) Patient is a 54 y.o. female presenting with mental health disorder.  Mental Health Problem Presenting symptoms: suicidal thoughts   Presenting symptoms: no hallucinations, no homicidal ideas and no suicide attempt   Degree of incapacity (severity):  Severe Onset quality:  Gradual Duration:  4 months Timing:  Constant Progression:  Worsening Chronicity:  Recurrent Context: not noncompliant, not recent medication change and not stressful life event   Context comment:  Worsening psoriatic arthritis and ability to care for self at home Treatment compliance:  All of the time Relieved by:  Nothing Exacerbated by: realization she is having more difficulty raising arms  Associated symptoms: no abdominal pain, no chest pain and no headaches     Past Medical History  Diagnosis Date  . Osteoporosis   . Psoriatic arthritis (HCC)   . Anemia   . Depression    Past Surgical History  Procedure Laterality Date  . Hip fracture surgery    . Knee surgery    . Foot surgery    . Hand surgery    . Neck surgery     Family History  Problem Relation Age of Onset  . Stroke Neg Hx    Social History  Substance Use Topics  . Smoking status: Never Smoker   . Smokeless tobacco: Never Used  . Alcohol Use: No   OB History    No data available     Review of Systems  Constitutional: Negative for fever.  HENT: Negative for sore throat.   Eyes: Negative for visual disturbance.  Respiratory: Negative for cough and shortness of breath.   Cardiovascular: Negative for chest pain.  Gastrointestinal: Negative for nausea, vomiting, abdominal pain and diarrhea.  Genitourinary: Negative for difficulty urinating.   Musculoskeletal: Positive for arthralgias (chronic). Negative for back pain and neck pain.  Skin: Positive for wound (chronic, has been seeing podiatry, last appt was 3 wks ago). Negative for rash.  Neurological: Negative for syncope and headaches.  Psychiatric/Behavioral: Positive for suicidal ideas and dysphoric mood. Negative for homicidal ideas and hallucinations.      Allergies  Review of patient's allergies indicates no known allergies.  Home Medications   Prior to Admission medications   Medication Sig Start Date End Date Taking? Authorizing Provider  escitalopram (LEXAPRO) 20 MG tablet Take 20 mg by mouth daily.   Yes Historical Provider, MD  traMADol (ULTRAM) 50 MG tablet Take by mouth every 6 (six) hours as needed.   Yes Historical Provider, MD  zolpidem (AMBIEN) 5 MG tablet Take 1 tablet (5 mg total) by mouth at bedtime as needed for sleep. 04/01/15   Beau Fanny, MD   BP 104/61 mmHg  Pulse 70  Temp(Src) 97.6 F (36.4 C) (Oral)  Resp 16  Ht 5\' 6"  (1.676 m)  Wt 101 lb 4.8 oz (45.949 kg)  BMI 16.36 kg/m2  SpO2 98% Physical Exam  Constitutional: She is oriented to person, place, and time. She appears well-developed and well-nourished. No distress.  HENT:  Head: Normocephalic and atraumatic.  Eyes: Conjunctivae and EOM are normal.  Neck: Normal range of motion.  Cardiovascular: Normal rate, regular rhythm, normal heart sounds and intact distal pulses.  Exam reveals no gallop and no friction rub.  No murmur heard. Pulmonary/Chest: Effort normal and breath sounds normal. No respiratory distress. She has no wheezes. She has no rales.  Abdominal: Soft. She exhibits no distension. There is no tenderness. There is no guarding.  Musculoskeletal: She exhibits no edema or tenderness.  Chronic bilateral deformities secondary to inflammatory arthritis Ulcer MTP of left foot, no surrounding cellulitis, no fluctuance, no purulent drainage  Neurological: She is alert and  oriented to person, place, and time.  Skin: Skin is warm and dry. No rash noted. She is not diaphoretic. No erythema.  Nursing note and vitals reviewed.   ED Course  Procedures (including critical care time) Labs Review Labs Reviewed  COMPREHENSIVE METABOLIC PANEL - Abnormal; Notable for the following:    Potassium 3.1 (*)    Glucose, Bld 192 (*)    Albumin 3.0 (*)    ALT 6 (*)    All other components within normal limits  CBC WITH DIFFERENTIAL/PLATELET - Abnormal; Notable for the following:    RBC 3.70 (*)    Hemoglobin 8.3 (*)    HCT 27.8 (*)    MCV 75.1 (*)    MCH 22.4 (*)    MCHC 29.9 (*)    RDW 17.4 (*)    All other components within normal limits  URINALYSIS, ROUTINE W REFLEX MICROSCOPIC (NOT AT [facility]) - Abnormal; Notable for the following:    Color, Urine AMBER (*)    APPearance CLOUDY (*)    Hgb urine dipstick LARGE (*)    Bilirubin Urine MODERATE (*)    Ketones, ur 15 (*)    Protein, ur 100 (*)    Leukocytes, UA LARGE (*)    All other components within normal limits  URINE MICROSCOPIC-ADD ON - Abnormal; Notable for the following:    Squamous Epithelial / LPF 6-30 (*)    Bacteria, UA MANY (*)    Casts HYALINE CASTS (*)    All other components within normal limits  URINE CULTURE  ETHANOL  URINE RAPID DRUG SCREEN, HOSP PERFORMED  PREGNANCY, URINE  TROPONIN I    Imaging Review No results found. I have personally reviewed and evaluated these images and lab results as part of my medical decision-making.   EKG Interpretation   Date/Time:  Saturday August 07 2015 09:56:52 EST Ventricular Rate:  76 PR Interval:  140 QRS Duration: 84 QT Interval:  440 QTC Calculation: 495 R Axis:   38 Text Interpretation:  Normal sinus rhythm ST \\T \ Twave  abnormalitiy   Prolonged QT Abnormal ECG No previous ECGs available Confirmed by  [facility] MD, Sunjai Levandoski ([id number]) on 08/07/2015 3:46:12 PM      MDM   Final diagnoses:  Severe episode of recurrent major depressive  disorder, without psychotic features (HCC)  Suicidal ideation  Chronic anemia  Hypokalemia  UTI (lower urinary tract infection)  Psoriatic arthritis (HCC)   54 year old female with a history of psoriatic arthritis, anemia of chronic disease, major depression, chronic ulcer of left foot seen by podiatry, presents with concern of worsening depression and suicidal ideation with plan for possible overdose.  Patient reports worsening depression in setting of worsening chronic medical issues and inability to use her arms secondary to complications of psoriatic arthritis.  She has a chronic foot ulcer, without signs of overlying cellulitis or abscess, which she reports is unchanged, and is appropriate for continued outpatient evaluation by podiatry.  She has difficulty moving both arms, and has been seeing orthopedics and is scheduled to see an neurology, however no hx of  acute pathology.  Labs showed a hemoglobin of 8.3, which is consistent with patient's prior hemoglobin levels with history of anemia of chronic disease.  CMP showed a potassium of 3.1, and patient was ordered by mouth potassium to be taken today and tomorrow. EKG shows prolonged QTC and ST abnormalities (no prior) and troponin checked and negative. No cp/sob and doubt ACS.   Urinalysis is concerning for possible urinary tract infection, and patient was ordered Keflex 3 times a day for 1 week.  Patient is hemodynamically stable, afebrile, and appropriate for outpt treatment of UTI, continued outpt evaluation of psoriatic arthritis and chronic foot ulcer.  She is medically cleared.  Patient's acute problem today is suicidal ideation and worsening depression for which TTS was consulted and recommend inpt treatment. Pt currently awaiting placement. Home medications ordered (except Cimzia which pt reports she takes every 2wks and is not on formulary.)   Alvira Monday, MD 08/07/15 1728

## 2015-08-07 NOTE — ED Notes (Signed)
TTS in process 

## 2015-08-07 NOTE — BH Assessment (Addendum)
Tele Assessment Note   Tara Brown is an 54 y.o. female who presents brought in by her sister reporting symptoms of depression and suicidal ideation. Pt has a history of depression since she has been physically declining with her arthritis and osteoporosis, and she is realizing she can not live on her own much longer. She recently lost the ability to raise her arms up above her shoulders, and states she had her first panic attack this week because she was so upset and frustrated when she could not get a DVD in the DVD player. 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 Pt states that she has lost 70 lbs in the past year and denies trying to starve herself, but states she has no appetite. Pt's sister was very concerned about pt not eating, so she brought her in.  Pt reports medication compliance with Lexapro, prescribed by her PCP, But had to stop Effexor after she was unable to F/U on OP treatment after her DC from [place]. Pt reports current suicidal ideation with plans of overdosing, "I just think that things would be so much easier if I was not around--my family can't take care of me because my sister is the only one who lives here, and she has a family of her own".   Pt denies past attempts.  Pt acknowledges symptoms including crying spells, social withdrawal, loss of interest in usual pleasures, decreased concentration, fatigue, irritability, increased sleep, decreased appetite and feelings of hopelessness. PT denies homicidal ideation or history of violence. Pt denies auditory or visual hallucinations or other psychotic symptoms. Pt denies alcohol or substance abuse.  Pt states current stressors include her physical consdition and financial problems due to inability to work. Pt lives alone, and supports include her siblings and a neighbor . Pt denies history of abuse and trauma. Pt reports there is a family history of suicide--her 69 yo  nephew committed suicide a couple of years ago. Pt has fair insight and judgement. Pt denies memory problems.   IP history includes 2 admissions at [facility] this past summer (July), and one admission to [facility] in 15-Jan-2008 after her mother died.  Pt is casually dressed, alert, oriented x4 with normal speech and normal motor behavior. Eye contact is good.  Pt's mood is depressed and affect is depressed and blunted. Affect is congruent with mood. Thought process is coherent and relevant. There is no indication Pt is currently responding to internal stimuli or experiencing delusional thought content. Pt was cooperative throughout assessment. Pt is currently unable to contract for safety outside the hospital and wants inpatient psychiatric treatment.  Disposition: Fransisca Kaufmann, NP recommends IP. TTS will seek placement. Dr. Dalene Seltzer, EDP agrees with disposition.  Diagnosis: MDD, recurrent, severe  Past Medical History:  Past Medical History  Diagnosis Date  . Osteoporosis   . Psoriatic arthritis (HCC)   . Anemia   . Depression     Past Surgical History  Procedure Laterality Date  . Hip fracture surgery    . Knee surgery    . Foot surgery    . Hand surgery    . Neck surgery      Family History:  Family History  Problem Relation Age of Onset  . Stroke Neg Hx     Social History:  reports that she has never smoked. She has never used smokeless tobacco. She reports that she does not drink alcohol or  use illicit drugs.  Additional Social History:  Alcohol / Drug Use Pain Medications: denies Prescriptions: denies Over the Counter: denies History of alcohol / drug use?: No history of alcohol / drug abuse Longest period of sobriety (when/how long): denies Negative Consequences of Use:  (denies)  CIWA: CIWA-Ar BP: 106/74 mmHg Pulse Rate: 73 COWS:    PATIENT STRENGTHS: (choose at least two) Ability for insight Average or above average intelligence Capable of independent  living Communication skills General fund of knowledge Motivation for treatment/growth Supportive family/friends  Allergies: No Known Allergies  Home Medications:  (Not in a hospital admission)  OB/GYN Status:  No LMP recorded. Patient is postmenopausal.  General Assessment Data Location of Assessment:  ([facility]) TTS Assessment: In system Is this a Tele or Face-to-Face Assessment?: Tele Assessment Is this an Initial Assessment or a Re-assessment for this encounter?: Initial Assessment Marital status: Divorced Maiden name: Leavelle Is patient pregnant?: No Pregnancy Status: No Living Arrangements: Alone Can pt return to current living arrangement?: Yes (wants assisted living) Admission Status: Voluntary Is patient capable of signing voluntary admission?: Yes Referral Source: Self/Family/Friend Insurance type:  [facility])     Crisis Care Plan Living Arrangements: Alone Name of Psychiatrist: none Name of Therapist: none  Education Status Is patient currently in school?: No  Risk to self with the past 6 months Suicidal Ideation: Yes-Currently Present Has patient been a risk to self within the past 6 months prior to admission? : No Suicidal Intent:  (possibly) Has patient had any suicidal intent within the past 6 months prior to admission? : No Is patient at risk for suicide?: Yes Suicidal Plan?: Yes-Currently Present Has patient had any suicidal plan within the past 6 months prior to admission? : Yes Specify Current Suicidal Plan: overdose Access to Means: Yes Specify Access to Suicidal Means: OTC medication What has been your use of drugs/alcohol within the last 12 months?: denies Previous Attempts/Gestures: No Other Self Harm Risks: none known Triggers for Past Attempts: None known Intentional Self Injurious Behavior: None Family Suicide History: Yes (nephew committed suicide) Recent stressful life event(s): Recent negative physical changes, Financial Problems Persecutory  voices/beliefs?: No Depression: Yes Depression Symptoms: Tearfulness, Despondent, Fatigue, Isolating, Loss of interest in usual pleasures, Guilt, Feeling worthless/self pity, Feeling angry/irritable Substance abuse history and/or treatment for substance abuse?: No Suicide prevention information given to non-admitted patients: Not applicable  Risk to Others within the past 6 months Homicidal Ideation: No Does patient have any lifetime risk of violence toward others beyond the six months prior to admission? : No Thoughts of Harm to Others: No Current Homicidal Intent: No Current Homicidal Plan: No Access to Homicidal Means: No History of harm to others?: No Assessment of Violence: None Noted Does patient have access to weapons?: No Criminal Charges Pending?: No Does patient have a court date: No Is patient on probation?: No  Psychosis Hallucinations: None noted Delusions: None noted  Mental Status Report Appearance/Hygiene: In scrubs, Unremarkable Eye Contact: Good Motor Activity: Unremarkable Speech: Logical/coherent Level of Consciousness: Alert Mood: Depressed, Sad Affect: Depressed, Sad Anxiety Level: Panic Attacks Panic attack frequency:  (had her first panic attack this week) Most recent panic attack:  (this week) Thought Processes: Coherent, Relevant Judgement: Unimpaired Orientation: Person, Place, Time, Situation, Appropriate for developmental age, Not oriented Obsessive Compulsive Thoughts/Behaviors: None  Cognitive Functioning Concentration: Fair Memory: Recent Intact, Remote Intact IQ: Average Insight: Good Impulse Control: Good Appetite: Poor Weight Loss:  (70 lbs in past year) Weight Gain: 0 Sleep: Increased Total  Hours of Sleep: 12 Vegetative Symptoms: Decreased grooming (due to physical limitations)  ADLScreening [facility] Assessment Services) Patient's cognitive ability adequate to safely complete daily activities?: Yes Patient able to express need for  assistance with ADLs?: Yes Independently performs ADLs?: No  Prior Inpatient Therapy Prior Inpatient Therapy: Yes Prior Therapy Dates: 2x in 01-20-15, 1x in 20-Jan-2008 Prior Therapy Facilty/Provider(s): [place] Jan 20, 2015, [facility] 01-20-08 Reason for Treatment: depression 01/20/15. mom died, 01-20-2008  Prior Outpatient Therapy Prior Outpatient Therapy: No Does patient have an ACCT team?: No Does patient have Intensive In-House Services?  : No Does patient have [facility] services? : No Does patient have P4CC services?: No  ADL Screening (condition at time of admission) Patient's cognitive ability adequate to safely complete daily activities?: Yes Is the patient deaf or have difficulty hearing?: No Does the patient have difficulty seeing, even when wearing glasses/contacts?: No Does the patient have difficulty concentrating, remembering, or making decisions?: No Patient able to express need for assistance with ADLs?: Yes Does the patient have difficulty dressing or bathing?: Yes Independently performs ADLs?: No Communication: Independent Dressing (OT): Needs assistance (possibly) Is this a change from baseline?: Pre-admission baseline Grooming: Needs assistance (brushing hair) Feeding: Independent Bathing: Needs assistance (shampooing hair) Toileting: Independent In/Out Bed: Independent Walks in Home: Independent (sometimes unsteady due to weakness and ulcer) Does the patient have difficulty walking or climbing stairs?: No Weakness of Legs: Both Weakness of Arms/Hands: Both  Home Assistive Devices/Equipment Home Assistive Devices/Equipment: Shower chair without back, Grab bars around toilet, Grab bars in shower, Hand-held shower hose    Abuse/Neglect Assessment (Assessment to be complete while patient is alone) Physical Abuse: Denies Verbal Abuse: Denies Sexual Abuse: Denies Exploitation of patient/patient's resources: Denies Self-Neglect: Denies Values / Beliefs Cultural Requests During Hospitalization:  None Spiritual Requests During Hospitalization: None   Advance Directives (For Healthcare) Does patient have an advance directive?: No Would patient like information on creating an advanced directive?: No - patient declined information    Additional Information 1:1 In Past 12 Months?: No CIRT Risk: No Elopement Risk: No Does patient have medical clearance?: Yes     Disposition:  Disposition Initial Assessment Completed for this Encounter: Yes Disposition of Patient: Inpatient treatment program  [facility] 08/07/2015 12:12 PM

## 2015-08-07 NOTE — ED Notes (Signed)
Patient ambulated without difficulty to the bathroom. Pateint reports she can perform her on ADLs at home.

## 2015-08-08 DIAGNOSIS — F332 Major depressive disorder, recurrent severe without psychotic features: Secondary | ICD-10-CM

## 2015-08-08 MED ORDER — ACETAMINOPHEN 325 MG PO TABS
650.0000 mg | ORAL_TABLET | Freq: Four times a day (QID) | ORAL | Status: DC | PRN
  Administered 2015-08-08: 650 mg via ORAL

## 2015-08-08 MED ORDER — FERROUS SULFATE 325 (65 FE) MG PO TABS
325.0000 mg | ORAL_TABLET | Freq: Every day | ORAL | Status: DC
Start: 2015-08-09 — End: 2015-08-09
  Administered 2015-08-08: 325 mg via ORAL
  Filled 2015-08-08: qty 1

## 2015-08-08 MED ORDER — CHOLECALCIFEROL 10 MCG (400 UNIT) PO TABS
400.0000 [IU] | ORAL_TABLET | Freq: Every day | ORAL | Status: DC
  Administered 2015-08-08: 400 [IU] via ORAL
  Filled 2015-08-08: qty 1

## 2015-08-08 MED ORDER — ACETAMINOPHEN 325 MG PO TABS
650.0000 mg | ORAL_TABLET | Freq: Once | ORAL | Status: DC
  Filled 2015-08-08: qty 2

## 2015-08-08 MED ORDER — FOLIC ACID 1 MG PO TABS
1.0000 mg | ORAL_TABLET | Freq: Every day | ORAL | Status: DC
  Administered 2015-08-08: 1 mg via ORAL
  Filled 2015-08-08: qty 1

## 2015-08-08 MED ORDER — PREDNISONE 20 MG PO TABS
10.0000 mg | ORAL_TABLET | Freq: Every day | ORAL | Status: DC
  Administered 2015-08-08: 10 mg via ORAL
  Filled 2015-08-08: qty 1

## 2015-08-08 MED ORDER — SILVER SULFADIAZINE 1 % EX CREA
1.0000 "application " | TOPICAL_CREAM | Freq: Every day | CUTANEOUS | Status: DC
  Filled 2015-08-08: qty 85

## 2015-08-08 NOTE — ED Notes (Signed)
Pt is pleasant and cooperative this morning.  She contracts for safety.  Walked to bathroom with patient and she was steady on her feet.  She does however c/o weakness.  15 minute checks and video monitoring continue.

## 2015-08-08 NOTE — ED Notes (Signed)
Report given to [facility].  Pt is alert and oriented.  Denies SI and HI and pt. Contracts for safety. 15 minute checks and video monitoring continue.

## 2015-08-08 NOTE — BH Assessment (Signed)
Patient accepted to [facility] adult unit by Dr. Tenny Craw and Julieanne Cotton, NP. Room assignment is 405-1. Nursing report [phone]. Support paperwork completed and patient to be transported to [facility] via [place].

## 2015-08-08 NOTE — Consult Note (Signed)
[facility] Face-to-Face Psychiatry Consult   Reason for Consult:  Major Depressive disorder, recurrent  Severe, feeling hopelessness, helplessness Referring Physician:  EDP Patient Identification: Tara Brown MRN:  [id number] Principal Diagnosis: Major depressive disorder, recurrent, severe without psychotic features ([place]) Diagnosis:   Patient Active Problem List   Diagnosis Date Noted  . Major depressive disorder, recurrent, severe without psychotic features ([place]) [F33.2] 03/11/2015    Priority: High  . Major depression ([place]) [F32.9] 03/26/2015  . Psoriatic arthritis ([place]) [L40.50] 03/11/2015  . Osteoporosis [M81.0] 03/11/2015  . Sepsis secondary to UTI ([place]) [A41.9, N39.0] 03/08/2015  . Leukopenia [D72.819] 03/08/2015  . Anemia of chronic disease [D63.8] 03/08/2015    Total Time spent with patient: 45 minutes  Subjective:   Tara Brown is a 54 y.o. female patient admitted with Major Depressive disorder, recurrent  Severe, feeling hopelessness, helplessness  HPI:  Caucasian female, 54 years old was evaluated for severe depression, hopelessness and helplessness.   She was brought in by her sister for concern that she is starving herself to death.  Patient reports that she is very depressed due to her chronic medical issues including crippling Arthritis.  Patient reports that she is unable to perform her ADLS due to pain.  Patient has been hospitalized twice for treatment of depression.  Patient was hospitalized last at [facility] in July this year.  Patient stopped taking medications or eating food.   She has lost about 70 lbs weight in 6 months and her family is concerned about her. Patient reports that life is no longer worth living.  Patient is not able to contract for safety.  Patient denies HI/AVH.  She has been accepted for admission and we will be seeking placement at any facility with available bed.  Past Psychiatric History: MDD  Risk to Self: Suicidal Ideation:  Yes-Currently Present Suicidal Intent:  (possibly) Is patient at risk for suicide?: Yes Suicidal Plan?: Yes-Currently Present Specify Current Suicidal Plan: overdose Access to Means: Yes Specify Access to Suicidal Means: OTC medication What has been your use of drugs/alcohol within the last 12 months?: denies Other Self Harm Risks: none known Triggers for Past Attempts: None known Intentional Self Injurious Behavior: None Risk to Others: Homicidal Ideation: No Thoughts of Harm to Others: No Current Homicidal Intent: No Current Homicidal Plan: No Access to Homicidal Means: No History of harm to others?: No Assessment of Violence: None Noted Does patient have access to weapons?: No Criminal Charges Pending?: No Does patient have a court date: No Prior Inpatient Therapy: Prior Inpatient Therapy: Yes Prior Therapy Dates: 2x in 2014/11/27, 1x in November 28, 2007 Prior Therapy Facilty/Provider(s): [place] 2014/11/27, [facility] 2007-11-28 Reason for Treatment: depression 2014/11/27. mom died, 2007/11/28 Prior Outpatient Therapy: Prior Outpatient Therapy: No Does patient have an ACCT team?: No Does patient have Intensive In-House Services?  : No Does patient have [facility] services? : No Does patient have P4CC services?: No  Past Medical History:  Past Medical History  Diagnosis Date  . Osteoporosis   . Psoriatic arthritis ([place])   . Anemia   . Depression     Past Surgical History  Procedure Laterality Date  . Hip fracture surgery    . Knee surgery    . Foot surgery    . Hand surgery    . Neck surgery     Family History:  Family History  Problem Relation Age of Onset  . Stroke Neg Hx    Family Psychiatric  History:  Denies Social History:  History  Alcohol Use No     History  Drug Use No    Social History   Social History  . Marital Status: Divorced    Spouse Name: N/A  . Number of Children: N/A  . Years of Education: N/A   Social History Main Topics  . Smoking status: Never Smoker   . Smokeless tobacco:  Never Used  . Alcohol Use: No  . Drug Use: No  . Sexual Activity: No   Other Topics Concern  . None   Social History Narrative   Additional Social History:    Pain Medications: denies Prescriptions: denies Over the Counter: denies History of alcohol / drug use?: No history of alcohol / drug abuse Longest period of sobriety (when/how long): denies Negative Consequences of Use:  (denies)   Allergies:  No Known Allergies  Labs:  Results for orders placed or performed during the hospital encounter of 08/07/15 (from the past 48 hour(s))  Comprehensive metabolic panel     Status: Abnormal   Collection Time: 08/07/15  9:50 AM  Result Value Ref Range   Sodium 135 135 - 145 mmol/L   Potassium 3.1 (L) 3.5 - 5.1 mmol/L   Chloride 101 101 - 111 mmol/L   CO2 23 22 - 32 mmol/L   Glucose, Bld 192 (H) 65 - 99 mg/dL   BUN 10 6 - 20 mg/dL   Creatinine, Ser 0.96 0.44 - 1.00 mg/dL   Calcium 9.0 8.9 - 10.3 mg/dL   Total Protein 7.8 6.5 - 8.1 g/dL   Albumin 3.0 (L) 3.5 - 5.0 g/dL   AST 16 15 - 41 U/L   ALT 6 (L) 14 - 54 U/L   Alkaline Phosphatase 73 38 - 126 U/L   Total Bilirubin 0.5 0.3 - 1.2 mg/dL   GFR calc non Af Amer >60 >60 mL/min   GFR calc Af Amer >60 >60 mL/min    Comment: (NOTE) The eGFR has been calculated using the CKD EPI equation. This calculation has not been validated in all clinical situations. eGFR's persistently <60 mL/min signify possible Chronic Kidney Disease.    Anion gap 11 5 - 15  Ethanol     Status: None   Collection Time: 08/07/15  9:50 AM  Result Value Ref Range   Alcohol, Ethyl (B) <5 <5 mg/dL    Comment:        LOWEST DETECTABLE LIMIT FOR SERUM ALCOHOL IS 5 mg/dL FOR MEDICAL PURPOSES ONLY   CBC with Diff     Status: Abnormal   Collection Time: 08/07/15  9:50 AM  Result Value Ref Range   WBC 6.9 4.0 - 10.5 K/uL   RBC 3.70 (L) 3.87 - 5.11 MIL/uL   Hemoglobin 8.3 (L) 12.0 - 15.0 g/dL   HCT 27.8 (L) 36.0 - 46.0 %   MCV 75.1 (L) 78.0 - 100.0 fL    MCH 22.4 (L) 26.0 - 34.0 pg   MCHC 29.9 (L) 30.0 - 36.0 g/dL   RDW 17.4 (H) 11.5 - 15.5 %   Platelets 387 150 - 400 K/uL   Neutrophils Relative % 69 %   Neutro Abs 4.7 1.7 - 7.7 K/uL   Lymphocytes Relative 23 %   Lymphs Abs 1.6 0.7 - 4.0 K/uL   Monocytes Relative 7 %   Monocytes Absolute 0.5 0.1 - 1.0 K/uL   Eosinophils Relative 1 %   Eosinophils Absolute 0.1 0.0 - 0.7 K/uL   Basophils Relative 0 %   Basophils Absolute 0.0 0.0 - 0.1  K/uL  Troponin I     Status: None   Collection Time: 08/07/15  9:50 AM  Result Value Ref Range   Troponin I <0.03 <0.031 ng/mL    Comment:        NO INDICATION OF MYOCARDIAL INJURY.   Urine rapid drug screen (hosp performed)not at [facility]     Status: None   Collection Time: 08/07/15 10:20 AM  Result Value Ref Range   Opiates NONE DETECTED NONE DETECTED   Cocaine NONE DETECTED NONE DETECTED   Benzodiazepines NONE DETECTED NONE DETECTED   Amphetamines NONE DETECTED NONE DETECTED   Tetrahydrocannabinol NONE DETECTED NONE DETECTED   Barbiturates NONE DETECTED NONE DETECTED    Comment:        DRUG SCREEN FOR MEDICAL PURPOSES ONLY.  IF CONFIRMATION IS NEEDED FOR ANY PURPOSE, NOTIFY LAB WITHIN 5 DAYS.        LOWEST DETECTABLE LIMITS FOR URINE DRUG SCREEN Drug Class       Cutoff (ng/mL) Amphetamine      1000 Barbiturate      200 Benzodiazepine   417 Tricyclics       408 Opiates          300 Cocaine          300 THC              50   Urinalysis, Routine w reflex microscopic (not at [facility])     Status: Abnormal   Collection Time: 08/07/15 10:20 AM  Result Value Ref Range   Color, Urine AMBER (A) YELLOW    Comment: BIOCHEMICALS MAY BE AFFECTED BY COLOR   APPearance CLOUDY (A) CLEAR   Specific Gravity, Urine 1.021 1.005 - 1.030   pH 6.0 5.0 - 8.0   Glucose, UA NEGATIVE NEGATIVE mg/dL   Hgb urine dipstick LARGE (A) NEGATIVE   Bilirubin Urine MODERATE (A) NEGATIVE   Ketones, ur 15 (A) NEGATIVE mg/dL   Protein, ur 100 (A) NEGATIVE mg/dL   Nitrite  NEGATIVE NEGATIVE   Leukocytes, UA LARGE (A) NEGATIVE  Pregnancy, urine     Status: None   Collection Time: 08/07/15 10:20 AM  Result Value Ref Range   Preg Test, Ur NEGATIVE NEGATIVE    Comment:        THE SENSITIVITY OF THIS METHODOLOGY IS >20 mIU/mL.   Urine microscopic-add on     Status: Abnormal   Collection Time: 08/07/15 10:20 AM  Result Value Ref Range   Squamous Epithelial / LPF 6-30 (A) NONE SEEN    Comment: Please note change in reference range.   WBC, UA 6-30 0 - 5 WBC/hpf    Comment: Please note change in reference range.   RBC / HPF 6-30 0 - 5 RBC/hpf    Comment: Please note change in reference range.   Bacteria, UA MANY (A) NONE SEEN    Comment: Please note change in reference range.   Casts HYALINE CASTS (A) NEGATIVE    Comment: GRANULAR CAST WBC CAST    Urine-Other MUCOUS PRESENT     Comment: YEAST PRESENT  Urine culture     Status: None (Preliminary result)   Collection Time: 08/07/15 10:20 AM  Result Value Ref Range   Specimen Description URINE, RANDOM    Special Requests NONE    Culture      CULTURE REINCUBATED FOR BETTER GROWTH Performed at [facility]    Report Status PENDING     Current Facility-Administered Medications  Medication Dose Route Frequency Provider Last  Rate Last Dose  . cephALEXin (KEFLEX) capsule 500 mg  500 mg Oral 3 times per day Gareth Morgan, MD   500 mg at 08/08/15 0841  . cholecalciferol (VITAMIN D) tablet 400 Units  400 Units Oral Daily Sherwood Gambler, MD      . escitalopram (LEXAPRO) tablet 20 mg  20 mg Oral Daily Gareth Morgan, MD   20 mg at 08/08/15 1020  . [START ON 08/09/2015] ferrous sulfate tablet 325 mg  325 mg Oral Q breakfast Sherwood Gambler, MD      . folic acid (FOLVITE) tablet 1 mg  1 mg Oral Daily Sherwood Gambler, MD      . lisinopril (PRINIVIL,ZESTRIL) tablet 10 mg  10 mg Oral Daily Gareth Morgan, MD      . potassium chloride SA (K-DUR,KLOR-CON) CR tablet 40 mEq  40 mEq Oral BID Gareth Morgan, MD    40 mEq at 08/08/15 1019  . [START ON 08/09/2015] predniSONE (DELTASONE) tablet 10 mg  10 mg Oral Q breakfast Sherwood Gambler, MD      . silver sulfADIAZINE (SILVADENE) 1 % cream 1 application  1 application Topical Daily Sherwood Gambler, MD      . traMADol (ULTRAM) tablet 50 mg  50 mg Oral Q6H PRN Gareth Morgan, MD   50 mg at 08/08/15 0200  . zolpidem (AMBIEN) tablet 5 mg  5 mg Oral QHS PRN Gareth Morgan, MD   5 mg at 08/07/15 2006   Current Outpatient Prescriptions  Medication Sig Dispense Refill  . Cholecalciferol (VITAMIN D PO) Take 1 capsule by mouth daily.    Marland Kitchen CIMZIA PREFILLED 2 X 200 MG/ML KIT Inject 1 Dose as directed every 14 (fourteen) days.  0  . escitalopram (LEXAPRO) 20 MG tablet Take 20 mg by mouth daily.    . ferrous sulfate 325 (65 FE) MG tablet Take 325 mg by mouth daily with breakfast.    . folic acid (FOLVITE) 1 MG tablet Take 1 mg by mouth daily.    Marland Kitchen lisinopril (PRINIVIL,ZESTRIL) 40 MG tablet Take 40 mg by mouth daily.    . methotrexate (RHEUMATREX) 2.5 MG tablet Take 10 mg by mouth once a week.     . predniSONE (DELTASONE) 10 MG tablet Take 10 mg by mouth daily with breakfast.    . SSD 1 % cream Apply 1 application topically daily.  0  . traMADol (ULTRAM) 50 MG tablet Take 100 mg by mouth every 6 (six) hours as needed for moderate pain.     Marland Kitchen zolpidem (AMBIEN) 10 MG tablet Take 10 mg by mouth at bedtime as needed for sleep.      Musculoskeletal: Strength & Muscle Tone: seen in bed lying down Gait & Station: was seen in bed Patient leans: see above  Psychiatric Specialty Exam: Review of Systems  Constitutional: Negative.   HENT: Negative.   Eyes: Negative.   Respiratory: Negative.   Cardiovascular: Negative.   Gastrointestinal: Negative.   Musculoskeletal: Positive for back pain and joint pain.       Hx of Arthritis with severe mobility issues and pain.  Skin: Negative.   Neurological: Negative.   Endo/Heme/Allergies: Negative.     Blood pressure  119/72, pulse 67, temperature 98.7 F (37.1 C), temperature source Oral, resp. rate 16, height 5' 6"  (1.676 m), weight 45.949 kg (101 lb 4.8 oz), SpO2 98 %.Body mass index is 16.36 kg/(m^2).  General Appearance: Casual and Disheveled  Eye Contact::  Fair  Speech:  Clear and Coherent and Normal  Rate  Volume:  Normal  Mood:  Anxious, Depressed and Dysphoric  Affect:  Congruent, Depressed, Flat and Tearful  Thought Process:  Coherent, Goal Directed and Intact  Orientation:  Full (Time, Place, and Person)  Thought Content:  WDL  Suicidal Thoughts:  No  Homicidal Thoughts:  No  Memory:  Immediate;   Good Recent;   Good Remote;   Good  Judgement:  Poor  Insight:  Shallow  Psychomotor Activity:  Psychomotor Retardation  Concentration:  Good  Recall:  [place] of Knowledge:Good  Language: Good  Akathisia:  NA  Handed:  Right  AIMS (if indicated):     Assets:  Desire for Improvement  ADL's:  Impaired  Cognition: WNL  Sleep:      Treatment Plan Summary: Daily contact with patient to assess and evaluate symptoms and progress in treatment and Medication management  Disposition:  Accepted for admission and we will be seeking placement at any facility with available  Bed.  We have resumed all home medications.  Delfin Gant   PMHNP-BC 08/08/2015 12:31 PM   Patient seen and I agree with treatment and plan  Griffin Dakin.D.

## 2015-08-09 ENCOUNTER — Inpatient Hospital Stay (HOSPITAL_COMMUNITY)
Admission: AD | Admit: 2015-08-09 | Discharge: 2015-08-12 | DRG: 885 | Disposition: A | Discharge status: Other Acute Inpatient Hospital | Payer: Medicare Other | Source: Intra-hospital | Attending: Internal Medicine | Admitting: Internal Medicine

## 2015-08-09 ENCOUNTER — Encounter (HOSPITAL_COMMUNITY): Payer: Self-pay

## 2015-08-09 DIAGNOSIS — G8929 Other chronic pain: Secondary | ICD-10-CM | POA: Diagnosis present

## 2015-08-09 DIAGNOSIS — M86172 Other acute osteomyelitis, left ankle and foot: Secondary | ICD-10-CM

## 2015-08-09 DIAGNOSIS — D638 Anemia in other chronic diseases classified elsewhere: Secondary | ICD-10-CM | POA: Diagnosis present

## 2015-08-09 DIAGNOSIS — Z8249 Family history of ischemic heart disease and other diseases of the circulatory system: Secondary | ICD-10-CM

## 2015-08-09 DIAGNOSIS — M1990 Unspecified osteoarthritis, unspecified site: Secondary | ICD-10-CM | POA: Diagnosis present

## 2015-08-09 DIAGNOSIS — R45851 Suicidal ideations: Secondary | ICD-10-CM | POA: Diagnosis present

## 2015-08-09 DIAGNOSIS — D509 Iron deficiency anemia, unspecified: Secondary | ICD-10-CM | POA: Diagnosis present

## 2015-08-09 DIAGNOSIS — M869 Osteomyelitis, unspecified: Secondary | ICD-10-CM | POA: Diagnosis not present

## 2015-08-09 DIAGNOSIS — E876 Hypokalemia: Secondary | ICD-10-CM | POA: Diagnosis present

## 2015-08-09 DIAGNOSIS — N390 Urinary tract infection, site not specified: Secondary | ICD-10-CM | POA: Diagnosis present

## 2015-08-09 DIAGNOSIS — M810 Age-related osteoporosis without current pathological fracture: Secondary | ICD-10-CM | POA: Diagnosis present

## 2015-08-09 DIAGNOSIS — L4050 Arthropathic psoriasis, unspecified: Secondary | ICD-10-CM | POA: Diagnosis present

## 2015-08-09 DIAGNOSIS — M659 Synovitis and tenosynovitis, unspecified: Secondary | ICD-10-CM | POA: Diagnosis present

## 2015-08-09 DIAGNOSIS — G4700 Insomnia, unspecified: Secondary | ICD-10-CM | POA: Diagnosis present

## 2015-08-09 DIAGNOSIS — L089 Local infection of the skin and subcutaneous tissue, unspecified: Secondary | ICD-10-CM | POA: Diagnosis not present

## 2015-08-09 DIAGNOSIS — F332 Major depressive disorder, recurrent severe without psychotic features: Principal | ICD-10-CM

## 2015-08-09 DIAGNOSIS — I10 Essential (primary) hypertension: Secondary | ICD-10-CM | POA: Diagnosis present

## 2015-08-09 DIAGNOSIS — M86272 Subacute osteomyelitis, left ankle and foot: Secondary | ICD-10-CM

## 2015-08-09 DIAGNOSIS — R8299 Other abnormal findings in urine: Secondary | ICD-10-CM | POA: Diagnosis not present

## 2015-08-09 LAB — URINE CULTURE

## 2015-08-09 MED ORDER — MIRTAZAPINE 7.5 MG PO TABS
7.5000 mg | ORAL_TABLET | Freq: Every day | ORAL | Status: DC
  Administered 2015-08-09 – 2015-08-10 (×2): 7.5 mg via ORAL
  Filled 2015-08-09 (×5): qty 1

## 2015-08-09 MED ORDER — PREDNISONE 10 MG PO TABS
10.0000 mg | ORAL_TABLET | Freq: Every day | ORAL | Status: DC
  Administered 2015-08-09 – 2015-08-12 (×4): 10 mg via ORAL
  Filled 2015-08-09 (×2): qty 1
  Filled 2015-08-09: qty 2
  Filled 2015-08-09 (×4): qty 1

## 2015-08-09 MED ORDER — TRAMADOL HCL 50 MG PO TABS
100.0000 mg | ORAL_TABLET | Freq: Three times a day (TID) | ORAL | Status: DC | PRN
  Administered 2015-08-09: 50 mg via ORAL
  Administered 2015-08-10 – 2015-08-12 (×7): 100 mg via ORAL
  Filled 2015-08-09 (×5): qty 2
  Filled 2015-08-09: qty 1
  Filled 2015-08-09 (×2): qty 2

## 2015-08-09 MED ORDER — CHOLECALCIFEROL 10 MCG (400 UNIT) PO TABS
400.0000 [IU] | ORAL_TABLET | Freq: Every day | ORAL | Status: DC
  Administered 2015-08-09 – 2015-08-12 (×4): 400 [IU] via ORAL
  Filled 2015-08-09 (×7): qty 1

## 2015-08-09 MED ORDER — CEPHALEXIN 500 MG PO CAPS
500.0000 mg | ORAL_CAPSULE | Freq: Three times a day (TID) | ORAL | Status: DC
  Administered 2015-08-09 – 2015-08-12 (×11): 500 mg via ORAL
  Filled 2015-08-09: qty 2
  Filled 2015-08-09 (×16): qty 1

## 2015-08-09 MED ORDER — LISINOPRIL 10 MG PO TABS
10.0000 mg | ORAL_TABLET | Freq: Every day | ORAL | Status: DC
  Administered 2015-08-09 – 2015-08-12 (×4): 10 mg via ORAL
  Filled 2015-08-09 (×6): qty 1

## 2015-08-09 MED ORDER — FERROUS SULFATE 325 (65 FE) MG PO TABS
325.0000 mg | ORAL_TABLET | Freq: Every day | ORAL | Status: DC
  Administered 2015-08-09 – 2015-08-12 (×4): 325 mg via ORAL
  Filled 2015-08-09 (×7): qty 1

## 2015-08-09 MED ORDER — FOLIC ACID 1 MG PO TABS
1.0000 mg | ORAL_TABLET | Freq: Every day | ORAL | Status: DC
  Administered 2015-08-09 – 2015-08-12 (×4): 1 mg via ORAL
  Filled 2015-08-09 (×6): qty 1

## 2015-08-09 MED ORDER — ACETAMINOPHEN 325 MG PO TABS
650.0000 mg | ORAL_TABLET | Freq: Four times a day (QID) | ORAL | Status: DC | PRN
  Administered 2015-08-10: 650 mg via ORAL
  Filled 2015-08-09: qty 2

## 2015-08-09 MED ORDER — POTASSIUM CHLORIDE CRYS ER 20 MEQ PO TBCR
40.0000 meq | EXTENDED_RELEASE_TABLET | Freq: Two times a day (BID) | ORAL | Status: AC
  Administered 2015-08-09: 40 meq via ORAL
  Filled 2015-08-09 (×2): qty 2

## 2015-08-09 MED ORDER — ESCITALOPRAM OXALATE 20 MG PO TABS
20.0000 mg | ORAL_TABLET | Freq: Every day | ORAL | Status: DC
  Administered 2015-08-09: 20 mg via ORAL
  Filled 2015-08-09 (×3): qty 1
  Filled 2015-08-09: qty 2

## 2015-08-09 MED ORDER — TRAMADOL HCL 50 MG PO TABS
50.0000 mg | ORAL_TABLET | Freq: Four times a day (QID) | ORAL | Status: DC | PRN
  Administered 2015-08-09: 50 mg via ORAL
  Filled 2015-08-09: qty 1

## 2015-08-09 MED ORDER — ZOLPIDEM TARTRATE 5 MG PO TABS
5.0000 mg | ORAL_TABLET | Freq: Every evening | ORAL | Status: DC | PRN
  Administered 2015-08-10 – 2015-08-11 (×2): 5 mg via ORAL
  Filled 2015-08-09 (×2): qty 1

## 2015-08-09 MED ORDER — SILVER SULFADIAZINE 1 % EX CREA
1.0000 | TOPICAL_CREAM | Freq: Every day | CUTANEOUS | Status: DC
Start: 2015-08-09 — End: 2015-08-12
  Administered 2015-08-09 – 2015-08-12 (×3): 1 via TOPICAL
  Filled 2015-08-09: qty 85

## 2015-08-09 MED ORDER — MIRTAZAPINE 15 MG PO TABS
15.0000 mg | ORAL_TABLET | Freq: Every day | ORAL | Status: DC
  Filled 2015-08-09: qty 1
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
## 2015-08-09 NOTE — Tx Team (Signed)
Initial Interdisciplinary Treatment Plan   PATIENT STRESSORS: Health problems Loss of strength in hands from rheumatoid arthritis   PATIENT STRENGTHS: Ability for insight Supportive family/friends   PROBLEM LIST: Problem List/Patient Goals Date to be addressed Date deferred Reason deferred Estimated date of resolution  Depression 08/09/2015     SI 08/09/2015     Declining health 08/09/2015                 "Help myself to learn to adapt physically to taking care of myself."                          DISCHARGE CRITERIA:  Adequate post-discharge living arrangements Improved stabilization in mood, thinking, and/or behavior  PRELIMINARY DISCHARGE PLAN: Return to previous living arrangement  PATIENT/FAMIILY INVOLVEMENT: This treatment plan has been presented to and reviewed with the patient, Eldonna Neuenfeldt, and/or family member.  The patient and family have been given the opportunity to ask questions and make suggestions.  Floyce Stakes 08/09/2015, 3:18 AM

## 2015-08-09 NOTE — [facility] Group Notes (Signed)
[facility] LCSW Group Therapy  08/09/2015 1:15pm  Type of Therapy:  Group Therapy vercoming Obstacles  Pt did not attend, declined invitation.   Chad Cordial, LCSWA 08/09/2015 3:26 PM

## 2015-08-09 NOTE — H&P (Addendum)
Psychiatric Admission Assessment Adult  Patient Identification: Tara Brown MRN:  [id number] Date of Evaluation:  08/09/2015 Chief Complaint:   " I just can't function the way I used to " Principal Diagnosis:  Major Depression, Recurrent, without psychotic features, versus Depression secondary to Chronic Illness  Diagnosis:   Patient Active Problem List   Diagnosis Date Noted  . Severe episode of recurrent major depressive disorder, without psychotic features ([place]) [F33.2]   . Major depression ([place]) [F32.9] 03/26/2015  . Psoriatic arthritis ([place]) [L40.50] 03/11/2015  . Osteoporosis [M81.0] 03/11/2015  . Major depressive disorder, recurrent, severe without psychotic features ([place]) [F33.2] 03/11/2015  . Sepsis secondary to UTI ([place]) [A41.9, N39.0] 03/08/2015  . Leukopenia [D72.819] 03/08/2015  . Anemia of chronic disease [D63.8] 03/08/2015   History of Present Illness:: 54 year old divorced female. Patient reports worsening depression in the context of increased difficulties in daily functioning and being able to live independently, due to ongoing progression of her chronic /debilitating psoriatic  arthritis. This illness has been severe, and has led to worsening joint pain, deformity, decreased range of motion. Patient states she has had arthritis for many years, but that it has tended to worsen over recent months, leading to difficulties functioning independently. This , in turn, has led to increased depression, sadness, and patient states she feels she is becoming a burden to family/ others. She reports significant depression, and neuro-vegetative symptoms of depression as below. Patient presented to ED with sister due to depression, passive suicidal ideations. As per initial ED notes, patient had developed suicidal ideations of starving self , and she  has lost very significant weight over recent months to year ( up to 70 lbs ) . At this time, however, denies intent to hurt self this  way, and states her anorexia is related to depression and medical illness / lack of appetite. Denies any psychotic symptoms  Associated Signs/Symptoms: Depression Symptoms:  depressed mood, anhedonia, insomnia, recurrent thoughts of death, loss of energy/fatigue, weight loss, decreased appetite, decreased sense of self esteem (Hypo) Manic Symptoms:  denies  Anxiety Symptoms:  some anxious ruminations about not being able to function as she used to and describes recent panic type episode when unable to use DVD player as she had in the past . Overall, however, denies panic, agoraphobia, social anxiety Psychotic Symptoms:  denies  PTSD Symptoms: denies  Total Time spent with patient: 45 minutes   Past Psychiatric History: this is her first psychiatric admission, has not attempted suicide in the past, denies history of self cutting , denies history of psychosis, denies history of mania. States she has been on Lexapro for a period of time, denies side effects.   Risk to Self: Is patient at risk for suicide?: Yes Risk to Others:   Prior Inpatient Therapy:   Prior Outpatient Therapy:    Alcohol Screening:   Substance Abuse History in the last 12 months:   Denies  Consequences of Substance Abuse:  denies  Previous Psychotropic Medications: has been on Lexapro for a period of months to years  Psychological Evaluations: No  Past Medical History: on Keflex for a UTI. 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 Of note, had surgery to correct toe deviation/deformity at that site years ago, as per her report.  * Of note, patient has been on Ultram 100 mgrs Q 6 hours for years,(  in combination with Lexapro over a period of months to years) - she denies  any seizures , denies any symptoms of serotonin syndrome, denies any other side effects Past Medical History  Diagnosis Date  . Osteoporosis   . Psoriatic arthritis ([place])   . Anemia   . Depression     Past  Surgical History  Procedure Laterality Date  . Hip fracture surgery    . Knee surgery    . Foot surgery    . Hand surgery    . Neck surgery     Family History:  Mother deceased, father alive , has younger sisters, states father may be alcoholic, does not endorse mental illness in family, but a nephew committed suicide . Family History  Problem Relation Age of Onset  . Stroke Neg Hx    Family Psychiatric  History:see above . Social History:  Patient is divorced, has no children, is on disability, lives alone . No legal issues. As noted, major stressor at this time is worsening ability to function independently due to progression of her rheumatologic disease . History  Alcohol Use No     History  Drug Use No    Social History   Social History  . Marital Status: Divorced    Spouse Name: N/A  . Number of Children: N/A  . Years of Education: N/A   Social History Main Topics  . Smoking status: Never Smoker   . Smokeless tobacco: Never Used  . Alcohol Use: No  . Drug Use: No  . Sexual Activity: No   Other Topics Concern  . None   Social History Narrative   Additional Social History:  Allergies:  No Known Allergies Lab Results: No results found for this or any previous visit (from the past 48 hour(s)).  Metabolic Disorder Labs:  No results found for: HGBA1C, MPG No results found for: PROLACTIN No results found for: CHOL, TRIG, HDL, CHOLHDL, VLDL, LDLCALC  Current Medications: Current Facility-Administered Medications  Medication Dose Route Frequency Provider Last Rate Last Dose  . acetaminophen (TYLENOL) tablet 650 mg  650 mg Oral Q6H PRN Delfin Gant, NP      . cephALEXin (KEFLEX) capsule 500 mg  500 mg Oral 3 times per day Delfin Gant, NP   500 mg at 08/09/15 5726  . cholecalciferol (VITAMIN D) tablet 400 Units  400 Units Oral Daily Delfin Gant, NP   400 Units at 08/09/15 2035  . escitalopram (LEXAPRO) tablet 20 mg  20 mg Oral Daily Delfin Gant, NP   20 mg at 08/09/15 0931  . ferrous sulfate tablet 325 mg  325 mg Oral Q breakfast Delfin Gant, NP   325 mg at 08/09/15 5974  . folic acid (FOLVITE) tablet 1 mg  1 mg Oral Daily Delfin Gant, NP   1 mg at 08/09/15 0939  . lisinopril (PRINIVIL,ZESTRIL) tablet 10 mg  10 mg Oral Daily Delfin Gant, NP   10 mg at 08/09/15 1638  . predniSONE (DELTASONE) tablet 10 mg  10 mg Oral Q breakfast Delfin Gant, NP   10 mg at 08/09/15 0936  . silver sulfADIAZINE (SILVADENE) 1 % cream 1 application  1 application Topical Daily Delfin Gant, NP   1 application at 45/36/46 0940  . traMADol (ULTRAM) tablet 50 mg  50 mg Oral Q6H PRN Delfin Gant, NP      . zolpidem (AMBIEN) tablet 5 mg  5 mg Oral QHS PRN Delfin Gant, NP       PTA Medications: Prescriptions prior to admission  Medication Sig Dispense Refill Last Dose  . Cholecalciferol (VITAMIN D PO) Take 1 capsule by mouth daily.   08/08/2015 at Unknown time  . CIMZIA PREFILLED 2 X 200 MG/ML KIT Inject 1 Dose as directed every 14 (fourteen) days.  0 Past Week at Unknown time  . escitalopram (LEXAPRO) 20 MG tablet Take 20 mg by mouth daily.   08/09/2015 at Unknown time  . ferrous sulfate 325 (65 FE) MG tablet Take 325 mg by mouth daily with breakfast.   08/09/2015 at Unknown time  . folic acid (FOLVITE) 1 MG tablet Take 1 mg by mouth daily.   08/09/2015 at Unknown time  . lisinopril (PRINIVIL,ZESTRIL) 40 MG tablet Take 40 mg by mouth daily.   08/09/2015 at Unknown time  . predniSONE (DELTASONE) 10 MG tablet Take 10 mg by mouth daily with breakfast.   08/09/2015 at Unknown time  . SSD 1 % cream Apply 1 application topically daily.  0 Past Week at Unknown time  . traMADol (ULTRAM) 50 MG tablet Take 100 mg by mouth every 6 (six) hours as needed for moderate pain.    08/09/2015 at Unknown time  . zolpidem (AMBIEN) 10 MG tablet Take 10 mg by mouth at bedtime as needed for sleep.   08/09/2015 at Unknown time     Musculoskeletal: Strength & Muscle Tone: within normal limits- impaired range of motion related to chronic arthritis  Gait & Station: normal Patient leans: N/A  Psychiatric Specialty Exam: Physical Exam  Review of Systems  Constitutional: Positive for weight loss and malaise/fatigue.  HENT: Negative.   Eyes: Negative.   Respiratory: Negative.   Cardiovascular: Negative.   Gastrointestinal: Negative for nausea and vomiting.  Genitourinary: Negative.   Musculoskeletal: Positive for joint pain.       Chronic arthritis affecting multiple joints    Skin: Negative.   Neurological: Negative for seizures.  Endo/Heme/Allergies: Negative.   Psychiatric/Behavioral: Positive for depression and suicidal ideas.    Blood pressure 133/83, pulse 80, temperature 98.1 F (36.7 C), temperature source Oral, resp. rate 16, height _0  (1.676 m), weight 104 lb (47.174 kg).Body mass index is 16.79 kg/(m^2).  General Appearance: Fairly Groomed  Engineer, water::  Good  Speech:  Normal Rate  Volume:  soft   Mood:  Depressed  Affect:  constricted but does smile at times appropriately  Thought Process:  Linear  Orientation:  Full (Time, Place, and Person)  Thought Content:  no hallucinations, no delusions, ruminative about decline in functional level related to her chronic illness   Suicidal Thoughts:  No- today denies any suicidal plan or intention and contracts for safety on the unit   Homicidal Thoughts:  No  Memory:  recent and remote grossly intact   Judgement:  Fair  Insight:  Present  Psychomotor Activity:  Normal-  Concentration:  Good  Recall:  Good  Fund of Knowledge:Good  Language: Good  Akathisia:  Negative  Handed:  Right  AIMS (if indicated):     Assets:  Communication Skills Resilience  ADL's:   Fair   Cognition: WNL  Sleep:  Number of Hours: 3.25     Treatment Plan Summary: Daily contact with patient to assess and evaluate symptoms and progress in treatment, Medication  management, Plan inpatient admission and medications as below   Observation Level/Precautions:  15 minute checks  Laboratory:  as needed - recheck BMP , follow K+ level  Psychotherapy:  Milieu, support   Medications:  For now discontinue  Lexapro 20 mgrs  QDAY- due to concern about elevated QTc - will repeat EKG to monitor and reassess  Add Remeron 7.5 mgrs QHS initially to further address depression and also help with insomnia and appetite- side effects and rationale discussed . Continue Ultram PRNs - as noted, patient denies having side effects or drug drug interactions   Consultations:  Will request hospitalist consultation regarding best antibiotic management   Discharge Concerns:  Patient interested in going to an [place]  Estimated LOS: 5-6 days   Other:     I certify that inpatient services furnished can reasonably be expected to improve the patient's condition.   COBOS, FERNANDO 11/28/201611:46 AM

## 2015-08-09 NOTE — BHH Suicide Risk Assessment (Signed)
[facility] Admission Suicide Risk Assessment   Nursing information obtained from:  Patient Demographic factors:  Caucasian, Living alone, Unemployed Current Mental Status:  Self-harm thoughts Loss Factors:  Decline in physical health Historical Factors:  NA Risk Reduction Factors:  Positive social support Total Time spent with patient: 45 minutes Principal Problem:  Major Depression, Severe, No psychotic features  Diagnosis:   Patient Active Problem List   Diagnosis Date Noted  . Severe episode of recurrent major depressive disorder, without psychotic features ([facility]) [F33.2]   . Major depression ([facility]) [F32.9] 03/26/2015  . Psoriatic arthritis ([facility]) [L40.50] 03/11/2015  . Osteoporosis [M81.0] 03/11/2015  . Major depressive disorder, recurrent, severe without psychotic features ([facility]) [F33.2] 03/11/2015  . Sepsis secondary to UTI ([facility]) [A41.9, N39.0] 03/08/2015  . Leukopenia [D72.819] 03/08/2015  . Anemia of chronic disease [D63.8] 03/08/2015     Continued Clinical Symptoms:    The "Alcohol Use Disorders Identification Test", Guidelines for Use in Primary Care, Second Edition.  World Science writer [facility]). Score between 0-7:  no or low risk or alcohol related problems. Score between 8-15:  moderate risk of alcohol related problems. Score between 16-19:  high risk of alcohol related problems. Score 20 or above:  warrants further diagnostic evaluation for alcohol dependence and treatment.   CLINICAL FACTORS:   54 year old female, chronically medically ill, with severe psoriatic arthritis, which has caused increased difficulty in her ability to function independently. Presents depressed, sad, with significant neuro-vegetative symptoms and weight loss, and passive SI.     Psychiatric Specialty Exam: Physical Exam  ROS  Blood pressure 133/83, pulse 80, temperature 98.1 F (36.7 C), temperature source Oral, resp. rate 16, height 5\' 6"  (1.676 m), weight 104 lb (47.174 kg).Body mass  index is 16.79 kg/(m^2).   see admit note MSE                                                        COGNITIVE FEATURES THAT CONTRIBUTE TO RISK:  Closed-mindedness and Loss of executive function    SUICIDE RISK:   Moderate:  Frequent suicidal ideation with limited intensity, and duration, some specificity in terms of plans, no associated intent, good self-control, limited dysphoria/symptomatology, some risk factors present, and identifiable protective factors, including available and accessible social support.  PLAN OF CARE: Patient will be admitted to inpatient psychiatric unit for stabilization and safety. Will provide and encourage milieu participation. Provide medication management and maked adjustments as needed.  Will follow daily.    Medical Decision Making:  Review of Psycho-Social Stressors (1), Review or order clinical lab tests (1), Established Problem, Worsening (2) and Review of New Medication or Change in Dosage (2)  I certify that inpatient services furnished can reasonably be expected to improve the patient's condition.   COBOS, FERNANDO 08/09/2015, 4:01 PM

## 2015-08-09 NOTE — Progress Notes (Signed)
Adult Psychoeducational Group Note  Date:  08/09/2015 Time:  9:39 PM  Group Topic/Focus:  Wrap-Up Group:   The focus of this group is to help patients review their daily goal of treatment and discuss progress on daily workbooks.  Participation Level:  Active  Participation Quality:  Appropriate and Attentive  Affect:  Appropriate  Cognitive:  Alert and Appropriate  Insight: Appropriate and Good  Engagement in Group:  Engaged  Modes of Intervention:  Education  Additional Comments:  Pt states she had a good day. Pt goal for tomorrow is to get some xray's taken and get ready for discharge.   Merlinda Frederick 08/09/2015, 9:39 PM

## 2015-08-09 NOTE — BHH Group Notes (Signed)
[facility] LCSW Aftercare Discharge Planning Group Note  08/09/2015 8:45 AM  Pt did not attend, declined invitation.   Chad Cordial, LCSWA 08/09/2015 10:01 AM

## 2015-08-09 NOTE — Progress Notes (Signed)
Patient ID: Tara Brown, female   DOB: 02-18-1961, 54 y.o.   MRN: [id number] D: Patient's main complaint is physical pain from her arthritis.  Per Dr. Jama Flavors, her tramadol was increased to 100 mg.  Gave patient a 2nd 50 mg when order was placed and explained to patient she can have another dose in 8 hours.  Patient has a CT order for the left foot, however, CT was unable to schedule it for today.  Advised staff to call back tomorrow to schedule appointment.  Patient rates her depression as a 6; hopelessness as a 5; anxiety as a 3.  She denies SI/HI/AVH.  She presents with sad, depressed mood.  She is calm and cooperative.  Her goal is "to feel better."  Patient also has order for EKGs daily starting in the am. A: Continue to monitor medication management and MD orders.  Safety checks completed every 15 minutes per protocol.  Offer support and encouragement as needed. R: Patient's is receptive to staff and pleasant upon approach.

## 2015-08-09 NOTE — Tx Team (Signed)
Interdisciplinary Treatment Plan Update (Adult) Date: 08/09/2015   Date: 08/09/2015 4:02 PM  Progress in Treatment:  Attending groups: Pt is new to milieu, continuing to assess  Participating in groups: Pt is new to milieu, continuing to assess  Taking medication as prescribed: Yes  Tolerating medication: Yes  Family/Significant othe contact made: No, CSW attempting to make contact with family Patient understands diagnosis: Yes Discussing patient identified problems/goals with staff: Yes  Medical problems stabilized or resolved: Yes  Denies suicidal/homicidal ideation: Pt recently admitted with passive SI Patient has not harmed self or Others: Yes   New problem(s) identified: None identified at this time.   Discharge Plan or Barriers: CSW will assess for appropriate discharge plan and relevant barriers.   Additional comments: n/a   Reason for Continuation of Hospitalization:  Depression Medical Issues Medication stabilization Suicidal ideation  Estimated length of stay: 3-5 days  Review of initial/current patient goals per problem list:   1.  Goal(s): Patient will participate in aftercare plan  Met:  No  Target date: 3-5 days from date of admission   As evidenced by: Patient will participate within aftercare plan AEB aftercare provider and housing plan at discharge being identified.  08/09/15: CSW to work with Pt to assess for appropriate discharge plan and faciliate appointments and referrals as needed prior to d/c.  2.  Goal (s): Patient will exhibit decreased depressive symptoms and suicidal ideations.  Met:  No  Target date: 3-5 days from date of admission   As evidenced by: Patient will utilize self rating of depression at 3 or below and demonstrate decreased signs of depression or be deemed stable for discharge by MD. 08/09/15: Pt was admitted with symptoms of depression, rating 10/10. Pt continues to present with flat affect and depressive symptoms.  Pt will  demonstrate decreased symptoms of depression and rate depression at 3/10 or lower prior to discharge. Endorsing passive SI.  Attendees:  Patient:    Family:    Physician: Dr. Parke Poisson, MD  08/09/2015 4:02 PM  Nursing: Lars Pinks, RN Case manager  08/09/2015 4:02 PM  Clinical Social Worker Peri Maris, [place] 08/09/2015 4:02 PM  Other: Tilden Fossa, LCSWA 08/09/2015 4:02 PM  Clinical:  Ventura Bruns, RN 08/09/2015 4:02 PM  Other: , RN Charge Nurse 08/09/2015 4:02 PM  Other: Hilda Lias, [place], [place] Social Work [phone]

## 2015-08-09 NOTE — Progress Notes (Signed)
Patient ID: Tara Brown, female   DOB: Jan 10, 1961, 54 y.o.   MRN: [id number] PER STATE REGULATIONS 482.30  THIS CHART WAS REVIEWED FOR MEDICAL NECESSITY WITH RESPECT TO THE PATIENT'S ADMISSION/DURATION OF STAY.  NEXT REVIEW DATE:08/13/15  Loura Halt, RN, BSN CASE MANAGER

## 2015-08-09 NOTE — Consult Note (Signed)
[facility] Medical Consultation  Tara Brown ZPH:[id number] DOB: 01-29-1961 DOA: 08/09/2015 PCP: Tara Medicus, MD   Requesting physician: Dr. Parke Poisson Date of consultation: 08/09/2015 Reason for consultation: toe infection   Impression/Recommendations Principal Problem:   Major depressive disorder, recurrent, severe without psychotic features [facility])  Chief Complaint: toe infection   HPI:  54 year old female with past medical history of hypertension, depression, has had hardware place for fracture in ankle in past. She has followed with podiatrist in [place] and was told back in July that her hardware needs to get out but once the infection on left great toe heals. She has apparently followed up regularly and has noticed pus draining from the wound on the left great toe. No fever or chills. No loss of sensation. No other complaints.   Assessment and Plan:  Depression - Per psychiatry   UTI - Urine culture with multiple species none predominant - Would recollect if symptoms of dysuria or hematuria or if pt has fever  - Now on cephalexin   Left toe infection - Concerning for osteomyelitis however she has no fevers or leukocytosis  - Will get CT scan for further evaluation - Currently on cephalexin which was started for UTI - WOC input appreciated   Iron deficiency anemia - Continue ferrous sulfate supplementation   Hypokalemia - Supplemented  Essential hypertension - Continue lisinopril    I will followup again tomorrow. Please contact me if I can be of assistance in the meanwhile. Thank you for this consultation.   Tara Brown [facility] [phone]    Review of Systems:  Constitutional: Negative for fever, chills, diaphoresis, activity change, appetite change and fatigue.  HENT: Negative for ear pain, nosebleeds, congestion, facial swelling, rhinorrhea, neck pain, neck stiffness and ear discharge.   Eyes: Negative for pain, discharge, redness, itching and  visual disturbance.  Respiratory: Negative for cough, choking, chest tightness, shortness of breath, wheezing and stridor.   Cardiovascular: Negative for chest pain, palpitations and leg swelling.  Gastrointestinal: Negative for abdominal distention.  Genitourinary: Negative for dysuria, urgency, frequency, hematuria, flank pain, decreased urine volume, difficulty urinating and dyspareunia.  Musculoskeletal: Negative for back pain, joint swelling, arthralgias and gait problem.  Neurological: Negative for dizziness, tremors, seizures, syncope, facial asymmetry, speech difficulty, weakness, light-headedness, numbness and headaches.  Hematological: Negative for adenopathy. Does not bruise/bleed easily.  Psychiatric/Behavioral: Negative for hallucinations, behavioral problems, confusion, dysphoric mood, decreased concentration and agitation.     Past Medical History  Diagnosis Date  . Osteoporosis   . Psoriatic arthritis ([place])   . Anemia   . Depression    Past Surgical History  Procedure Laterality Date  . Hip fracture surgery    . Knee surgery    . Foot surgery    . Hand surgery    . Neck surgery     Social History:  reports that she has never smoked. She has never used smokeless tobacco. She reports that she does not drink alcohol or use illicit drugs.  No Known Allergies Family History  Problem Relation Age of Onset  . Hypertension  Mother      Prior to Admission medications   Medication Sig Start Date End Date Taking? Authorizing Provider  Cholecalciferol (VITAMIN D PO) Take 1 capsule by mouth daily.   Yes Historical Provider, MD  CIMZIA PREFILLED 2 X 200 MG/ML KIT Inject 1 Dose as directed every 14 (fourteen) days. 06/15/15  Yes Historical Provider, MD  escitalopram (LEXAPRO) 20 MG tablet Take 20 mg by  mouth daily.   Yes Historical Provider, MD  ferrous sulfate 325 (65 FE) MG tablet Take 325 mg by mouth daily with breakfast.   Yes Historical Provider, MD  folic acid (FOLVITE)  1 MG tablet Take 1 mg by mouth daily.   Yes Historical Provider, MD  lisinopril (PRINIVIL,ZESTRIL) 40 MG tablet Take 40 mg by mouth daily. 02/03/15  Yes Historical Provider, MD  predniSONE (DELTASONE) 10 MG tablet Take 10 mg by mouth daily with breakfast.   Yes Historical Provider, MD  SSD 1 % cream Apply 1 application topically daily. 05/25/15  Yes Historical Provider, MD  traMADol (ULTRAM) 50 MG tablet Take 100 mg by mouth every 6 (six) hours as needed for moderate pain.    Yes Historical Provider, MD  zolpidem (AMBIEN) 10 MG tablet Take 10 mg by mouth at bedtime as needed for sleep.   Yes Historical Provider, MD   Physical Exam: Blood pressure 133/83, pulse 80, temperature 98.1 F (36.7 C), temperature source Oral, resp. rate 16, height 5' 6"  (1.676 m), weight 47.174 kg (104 lb). Filed Vitals:   08/09/15 0036 08/09/15 0925  BP: 101/69 133/83  Pulse: 101 80  Temp:    Resp: 16     Physical Exam  Constitutional: Appears well-developed and well-nourished. No distress.  HENT: Normocephalic. External right and left ear normal. Oropharynx is clear and moist.  Eyes: Conjunctivae are normal. No scleral icterus.  Neck: Normal ROM. Neck supple. No JVD. No tracheal deviation. No thyromegaly.  CVS: RRR, S1/S2 +, no murmurs, no gallops, no carotid bruit.  Pulmonary: Effort and breath sounds normal, no stridor, rhonchi, wheezes, rales.  Abdominal: Soft. BS +,  no distension, tenderness, rebound or guarding.  Musculoskeletal: Normal range of motion. No edema and no tenderness.  Lymphadenopathy: No lymphadenopathy noted, cervical, inguinal. Neuro: Alert. Normal reflexes, muscle tone coordination. No cranial nerve deficit. Skin: Skin is warm and dry. Left great toe wound (+) Psychiatric: Normal mood and affect.     Labs on Admission:  Basic Metabolic Panel:  Recent Labs Lab 08/07/15 0950  NA 135  K 3.1*  CL 101  CO2 23  GLUCOSE 192*  BUN 10  CREATININE 0.96  CALCIUM 9.0   Liver  Function Tests:  Recent Labs Lab 08/07/15 0950  AST 16  ALT 6*  ALKPHOS 73  BILITOT 0.5  PROT 7.8  ALBUMIN 3.0*   No results for input(s): LIPASE, AMYLASE in the last 168 hours. No results for input(s): AMMONIA in the last 168 hours. CBC:  Recent Labs Lab 08/07/15 0950  WBC 6.9  NEUTROABS 4.7  HGB 8.3*  HCT 27.8*  MCV 75.1*  PLT 387   Cardiac Enzymes:  Recent Labs Lab 08/07/15 0950  TROPONINI <0.03   BNP: Invalid input(s): POCBNP CBG: No results for input(s): GLUCAP in the last 168 hours.  Radiological Exams on Admission: No results found.  Time spent: 50 minutes  Tara Brown [facility] Pager [phone]  If 7PM-7AM, please contact night-coverage [email or web address] Password [facility] 08/09/2015, 4:56 PM

## 2015-08-09 NOTE — Progress Notes (Signed)
Patient voluntarily admitted due to depression resulting from her declining health. Patient has arthritis which is affecting her strength in her hands which is causing difficulty for her to care for self. She has a wound on her left upper great toe she reports is caused from hardware that has been in her toe for years that has become infected. She reports that she has lost a significant amount of weight in a short period of time. She reports fleeting thoughts of suicide but contracts for safety, denies hi/a/v hallucinations. Medical hx include osteoporosis, leukopenia, anemic, and arthritis. Patient oriented to unit, meal offered but declined. Patient will need to be moved closer to nursing station in handicap room. Safety maintained on unit with 15 min checks.

## 2015-08-10 ENCOUNTER — Ambulatory Visit (HOSPITAL_COMMUNITY): Admit: 2015-08-10 | Payer: Medicare Other

## 2015-08-10 ENCOUNTER — Inpatient Hospital Stay (HOSPITAL_COMMUNITY)
Admission: AD | Admit: 2015-08-10 | Discharge: 2015-08-10 | Disposition: A | Discharge status: Home / Self Care | Payer: Medicare Other | Source: Intra-hospital | Attending: Internal Medicine | Admitting: Internal Medicine

## 2015-08-10 ENCOUNTER — Other Ambulatory Visit (HOSPITAL_COMMUNITY): Payer: Self-pay

## 2015-08-10 DIAGNOSIS — M86172 Other acute osteomyelitis, left ankle and foot: Secondary | ICD-10-CM

## 2015-08-10 LAB — BASIC METABOLIC PANEL
ANION GAP: 9 (ref 5–15)
BUN: 14 mg/dL (ref 6–20)
CALCIUM: 10 mg/dL (ref 8.9–10.3)
CHLORIDE: 106 mmol/L (ref 101–111)
CO2: 27 mmol/L (ref 22–32)
CREATININE: 1.16 mg/dL — AB (ref 0.44–1.00)
GFR calc Af Amer: 60 mL/min — ABNORMAL LOW (ref >60)
GFR calc non Af Amer: 52 mL/min — ABNORMAL LOW (ref >60)
GLUCOSE: 90 mg/dL (ref 65–99)
Potassium: 4.6 mmol/L (ref 3.5–5.1)
Sodium: 142 mmol/L (ref 135–145)

## 2015-08-10 MED ORDER — IOHEXOL 300 MG/ML  SOLN
100.0000 mL | Freq: Once | INTRAMUSCULAR | Status: AC | PRN
  Administered 2015-08-10: 100 mL via INTRAVENOUS

## 2015-08-10 NOTE — Consult Note (Signed)
WOC wound consult note Reason for Consult: chronic wounds (2) to left toe.  Patient has hardware in this foot from a surgical attempt to correct the alignment of toes due to arthritis several years ago.  Two wounds have not healed and are chronic, non-healing. Suspicious for osteomyelitis. Wound type: Infectious Pressure Ulcer POA: No Measurement: anterior aspect measures 1.5cm x 1.5cm x 0.2cm and presents as light pink, moist and non-granulating.  Posterior aspect measures 0.4cm round x 0.2cm deep and light pink, non-granulating. Wound bed:As described above. Drainage (amount, consistency, odor) small amount of dried serum on wounds and surrounding skin from old dressing Periwound:intact, erythematous, no induration, no warmth Dressing procedure/placement/frequency: I will implement a conservative POC for topical care that includes cleansing daily and placement of a moisture retentive dressing (white petrolatum).  Wound dressing will be secured with a gauze wrap and tape and changed daily. WOC nursing team will not follow, but will remain available to this patient, the nursing and medical teams.  Please re-consult if needed. Thanks, Ladona Mow, MSN, RN, GNP, [place]  Pager# [phone]

## 2015-08-10 NOTE — Progress Notes (Signed)
D:Affet is flat/sad,mood is depressed. Pt was seen by and was cooperative with WOC Services RN Ladona Mow for consult regarding chronic wounds to left toe. (see consult note) Pt with no further complaints and is NPO at this time awaiting CT scan scheduled for 16:30 today. [facility] called for transport and will be here at 16:15 for pt. A:Support and encouragement offered. R:Receptive. No complaints of pain or problems at this time.

## 2015-08-10 NOTE — Consult Note (Addendum)
  [facility] PROGRESS NOTE  Ashara Lounsbury GJF:[id number] DOB: July 07, 1961 DOA: 08/09/2015 PCP: Francee Gentile, MD  Brief narrative:    54 year old female with past medical history of hypertension, depression, has had hardware place for fracture in ankle in past. She has followed with podiatrist in [place] and was told back in July that her hardware needs to get out but once the infection on left great toe heals. She has apparently followed up regularly and has noticed pus draining from the wound on the left great toe.   Assessment/Plan:    Left toe infection - Awaiting results from CT scan  - Seen by WOC. Pt has chronic wounds (2) to left toe. She has hardware in this foot from a surgical attempt to correct the alignment of toes due to arthritis several years ago.Two wounds have not healed and are chronic, non-healing. Suspicious for osteomyelitis. - Dressing changes per WOC recommendations.  - If this is the case on CT scan she needs referal to ortho and would then consult ID for recommendations on abx choice   UTI - Urine culture with multiple species none predominant - On cephalexin   Essential hypertension - Continue lisinopril    Manson Passey, MD  [facility] Pager [phone]

## 2015-08-10 NOTE — Progress Notes (Signed)
[facility] MD Progress Note  08/10/2015 4:59 PM Tara Brown  MRN:  [id number] Subjective:  Patient reports some improvement compared to admission. Reports chronic pain associated with her arthritis, for which she takes Ultram. Does not endorse medication side effects at this time. Objective : I have discussed case with treatment team and patient seen. Patient has continued to present with some depression, sad affect, but has been more visible on unit, and has been going to some groups. Appreciate Hospitalist and Wound Care evaluations, regarding her chronic ulcer on L toe . Today scheduled for CT scan for further diagnostic work up. She is not endorsing medication side effects- tolerated Remeron trial well thus far   Principal Problem: Major depressive disorder, recurrent, severe without psychotic features ([place]) Diagnosis:   Patient Active Problem List   Diagnosis Date Noted  . Osteomyelitis ([place]) [M86.9]   . Hypokalemia [E87.6]   . Anemia, iron deficiency [D50.9]   . Essential hypertension [I10]   . Severe episode of recurrent major depressive disorder, without psychotic features ([place]) [F33.2]   . Major depression ([place]) [F32.9] 03/26/2015  . Psoriatic arthritis ([place]) [L40.50] 03/11/2015  . Osteoporosis [M81.0] 03/11/2015  . Major depressive disorder, recurrent, severe without psychotic features ([place]) [F33.2] 03/11/2015  . Sepsis secondary to UTI ([place]) [A41.9, N39.0] 03/08/2015  . Leukopenia [D72.819] 03/08/2015  . Anemia of chronic disease [D63.8] 03/08/2015   Total Time spent with patient: 20 minutes     Past Medical History:  Past Medical History  Diagnosis Date  . Osteoporosis   . Psoriatic arthritis ([place])   . Anemia   . Depression     Past Surgical History  Procedure Laterality Date  . Hip fracture surgery    . Knee surgery    . Foot surgery    . Hand surgery    . Neck surgery     Family History:  Family History  Problem Relation Age of Onset  . Stroke Neg Hx      Social History:  History  Alcohol Use No     History  Drug Use No    Social History   Social History  . Marital Status: Divorced    Spouse Name: N/A  . Number of Children: N/A  . Years of Education: N/A   Social History Main Topics  . Smoking status: Never Smoker   . Smokeless tobacco: Never Used  . Alcohol Use: No  . Drug Use: No  . Sexual Activity: No   Other Topics Concern  . None   Social History Narrative   Additional Social History:   Sleep: improved   Appetite:  Fair  Current Medications: Current Facility-Administered Medications  Medication Dose Route Frequency Provider Last Rate Last Dose  . acetaminophen (TYLENOL) tablet 650 mg  650 mg Oral Q6H PRN Delfin Gant, NP      . cephALEXin (KEFLEX) capsule 500 mg  500 mg Oral 3 times per day Delfin Gant, NP   500 mg at 08/10/15 4235  . cholecalciferol (VITAMIN D) tablet 400 Units  400 Units Oral Daily Delfin Gant, NP   400 Units at 08/10/15 0843  . ferrous sulfate tablet 325 mg  325 mg Oral Q breakfast Delfin Gant, NP   325 mg at 08/10/15 0844  . folic acid (FOLVITE) tablet 1 mg  1 mg Oral Daily Delfin Gant, NP   1 mg at 08/10/15 0844  . lisinopril (PRINIVIL,ZESTRIL) tablet 10 mg  10 mg Oral Daily Josephine C  Onuoha, NP   10 mg at 08/10/15 0843  . mirtazapine (REMERON) tablet 7.5 mg  7.5 mg Oral QHS Myer Peer Cobos, MD   7.5 mg at 08/09/15 2104  . predniSONE (DELTASONE) tablet 10 mg  10 mg Oral Q breakfast Delfin Gant, NP   10 mg at 08/10/15 0844  . silver sulfADIAZINE (SILVADENE) 1 % cream 1 application  1 application Topical Daily Delfin Gant, NP   Stopped at 08/10/15 0844  . traMADol (ULTRAM) tablet 100 mg  100 mg Oral Q8H PRN Jenne Campus, MD   100 mg at 08/10/15 1937  . zolpidem (AMBIEN) tablet 5 mg  5 mg Oral QHS PRN Delfin Gant, NP        Lab Results:  Results for orders placed or performed during the hospital encounter of 08/09/15 (from the  past 48 hour(s))  Basic metabolic panel     Status: Abnormal   Collection Time: 08/10/15  6:50 AM  Result Value Ref Range   Sodium 142 135 - 145 mmol/L   Potassium 4.6 3.5 - 5.1 mmol/L   Chloride 106 101 - 111 mmol/L   CO2 27 22 - 32 mmol/L   Glucose, Bld 90 65 - 99 mg/dL   BUN 14 6 - 20 mg/dL   Creatinine, Ser 1.16 (H) 0.44 - 1.00 mg/dL   Calcium 10.0 8.9 - 10.3 mg/dL   GFR calc non Af Amer 52 (L) >60 mL/min   GFR calc Af Amer 60 (L) >60 mL/min    Comment: (NOTE) The eGFR has been calculated using the CKD EPI equation. This calculation has not been validated in all clinical situations. eGFR's persistently <60 mL/min signify possible Chronic Kidney Disease.    Anion gap 9 5 - 15    Comment: Performed at [facility]    Physical Findings: AIMS: Facial and Oral Movements Muscles of Facial Expression: None, normal Lips and Perioral Area: None, normal Jaw: None, normal Tongue: None, normal,Extremity Movements Upper (arms, wrists, hands, fingers): None, normal Lower (legs, knees, ankles, toes): None, normal, Trunk Movements Neck, shoulders, hips: None, normal, Overall Severity Severity of abnormal movements (highest score from questions above): None, normal Incapacitation due to abnormal movements: None, normal Patient's awareness of abnormal movements (rate only patient's report): No Awareness, Dental Status Current problems with teeth and/or dentures?: No Does patient usually wear dentures?: No  CIWA:    COWS:     Musculoskeletal: Strength & Muscle Tone: within normal limits- range of motion  Affected by arthritis  Gait & Station: normal Patient leans: N/A  Psychiatric Specialty Exam: ROS chronic joint pain related to chronic arthritis, chronic ulceration with drainage and erythema on L toe, no fever, no chills   Blood pressure 131/78, pulse 86, temperature 97.5 F (36.4 C), temperature source Oral, resp. rate 16, height 5' 6"  (1.676 m), weight 104 lb  (47.174 kg).Body mass index is 16.79 kg/(m^2).  General Appearance: Fairly Groomed  Engineer, water::  Good  Speech:  Normal Rate  Volume:  Normal  Mood:  Depressed  Affect:  constricted but reactive affect   Thought Process:  Linear  Orientation:  Other:  fully alert and attentive   Thought Content:  no hallucinations, no delusions, not internally preoccupied   Suicidal Thoughts:  No denies plan or intention of hurting self or of SI  Homicidal Thoughts:  No  Memory:  recent and remote grossly intact   Judgement:  Other:  improving  Insight:  Present  Psychomotor Activity:  Decreased  Concentration:  Good  Recall:  good  Fund of Knowledge:Good  Language: Good  Akathisia:  Negative  Handed:  Right  AIMS (if indicated):     Assets:  Communication Skills Desire for Improvement Resilience  ADL's:   Fair   Cognition: WNL  Sleep:  Number of Hours: 6.75  Assessment - patient remains depressed, sad, but not suicidal , not psychotic, and has tolerated Remeron trial well . She is being worked up for chronic infection/ulceration on L foot .  Treatment Plan Summary: Daily contact with patient to assess and evaluate symptoms and progress in treatment, Medication management, Plan inpatient admission and medications as below Continue to encourage milieu, group participation to work on coping skills and symptom reduction Continue Remeron 7.5 mgrs QHS for depression, and to help with insomnia Continue Ultram  100 mgrs Q 8 hours PRNs for chronic pain Continue work up, management for chronic infection COBOS, [place] 08/10/2015, 4:59 PM

## 2015-08-10 NOTE — Progress Notes (Signed)
Recreation Therapy Notes Animal-Assisted Activity (AAA) Program Checklist/Progress Notes Patient Eligibility Criteria Checklist & Daily Group note for Rec Tx Intervention  Date: 11.29.2016 Time: 2:45pm Location: [address], applications    AAA/T Program Assumption of Risk Form signed by Patient/ or Parent Legal Guardian yes  Patient is free of allergies or sever asthma yes  Patient reports no fear of animals yes  Patient reports no history of cruelty to animals yes  Patient understands his/her participation is voluntary yes  Patient washes hands before animal contact yes  Patient washes hands after animal contact yes  Behavioral Response: Appropriate   Education: Hand Washing, Appropriate Animal Interaction   Education Outcome: Acknowledges education.   Clinical Observations/Feedback: Patient engaged appropriately with therapy dog and peers in session.    [name] Karleigh Bunte, LRT/CTRS  Orissa Arreaga L 08/10/2015 3:14 PM

## 2015-08-10 NOTE — [facility] Group Notes (Signed)
[facility] LCSW Group Therapy 08/10/2015 1:15 PM  Type of Therapy: Group Therapy- Feelings about Diagnosis  Pt did not attend, declined invitation.  Chad Cordial, LCSWA 08/10/2015 3:33 PM

## 2015-08-10 NOTE — [facility] Group Notes (Signed)
Adult Psychoeducational Group Note  Date:  08/10/2015 Time:  9:15 PM  Group Topic/Focus:  Wrap-Up Group:   The focus of this group is to help patients review their daily goal of treatment and discuss progress on daily workbooks.  Participation Level:  Minimal  Participation Quality:  Attentive  Affect:  Flat  Cognitive:  Alert  Insight: Good  Engagement in Group:  Limited  Modes of Intervention:  Discussion  Additional Comments:  Patient stated her day was okay.  She had "a bunch of tests done and took a shower".  She expressed that she will start discharge planning tomorrow.  Caroll Rancher A 08/10/2015, 9:15 PM

## 2015-08-10 NOTE — [facility] Suicide Risk Assessment (Signed)
[facility] INPATIENT:  Family/Significant Other Suicide Prevention Education  Suicide Prevention Education:  Education Completed; Tara Brown, Tara Brown, [phone],  (name of family member/significant other) has been identified by the patient as the family member/significant other with whom the patient will be residing, and identified as the person(s) who will aid the patient in the event of a mental health crisis (suicidal ideations/suicide attempt).  With written consent from the patient, the family member/significant other has been provided the following suicide prevention education, prior to the and/or following the discharge of the patient.  The suicide prevention education provided includes the following:  Suicide risk factors  Suicide prevention and interventions  National Suicide Hotline telephone number  [facility] assessment telephone number  [facility] Emergency Assistance 911  [facility] and/or Residential Mobile Crisis Unit telephone number  Request made of family/significant other to:  Remove weapons (e.g., guns, rifles, knives), all items previously/currently identified as safety concern.    Remove drugs/medications (over-the-counter, prescriptions, illicit drugs), all items previously/currently identified as a safety concern.  The family member/significant other verbalizes understanding of the suicide prevention education information provided.  The family member/significant other agrees to remove the items of safety concern listed above.  CSW spoke w Tara Brown, feels pt needs to enter "some kind of assisted living", wants patient to apply for Special Assistance Medicaid in order to qualify.  Does not see that patient will be able to return home to apartment.  Says patient "will not get out of bed", has discussed realities of budget w patient "repeatedly."  Says patient's finances are strained.  CSW reviewed SPE w Tara Brown, Tara Brown concerned that patient is  facing loss of independence and needs to make plan to transition to living arrangement w additional support, family cannot provide level of support patient currently requires.  Tara Brown aware of risk factors, warning signs and procedures for suicidal crisis.  CSW provided support and encouragement to Tara Brown - validated her concerns re increased care needs of patient, outlined possible plan for referrals to [facility] for disability housing assistance.  Tara Brown 08/10/2015, 2:43 PM

## 2015-08-10 NOTE — Progress Notes (Signed)
NUTRITION ASSESSMENT  Pt identified as at risk on the Malnutrition Screen Tool  INTERVENTION: 1. Supplements: Ensure Enlive po BID, each supplement provides 350 kcal and 20 grams of protein 2. Encourage PO intake  NUTRITION DIAGNOSIS: Unintentional weight loss related to sub-optimal intake as evidenced by pt report.   Goal: Pt to meet >/= 90% of their estimated nutrition needs.  Monitor:  PO intake  Assessment:  Pt admitted with depression. Pt with history of osteomyelitis. Pt reports 70 lb weight loss over the past year. Pt reports really poor appetite from her depression. Pt is underweight. Pt willing to try Ensure supplements, RD to order.   Height: Ht Readings from Last 1 Encounters:  08/09/15 5\' 6"  (1.676 m)    Weight: Wt Readings from Last 1 Encounters:  08/09/15 104 lb (47.174 kg)    Weight Hx: Wt Readings from Last 10 Encounters:  08/09/15 104 lb (47.174 kg)  08/07/15 101 lb 4.8 oz (45.949 kg)  03/26/15 119 lb (53.978 kg)  03/26/15 125 lb (56.7 kg)  03/11/15 125 lb (56.7 kg)  03/08/15 129 lb 6.6 oz (58.7 kg)  10/07/13 170 lb (77.111 kg)    BMI:  Body mass index is 16.79 kg/(m^2). Pt meets criteria for underweight based on current BMI.  Estimated Nutritional Needs: Kcal: 25-30 kcal/kg Protein: > 1 gram protein/kg Fluid: 1 ml/kcal  Diet Order:   Pt is also offered choice of unit snacks mid-morning and mid-afternoon.  Pt is eating as desired.   Lab results and medications reviewed.   10/09/13, MS, RD, LDN Pager: [phone] After Hours Pager: [phone]

## 2015-08-10 NOTE — Progress Notes (Signed)
D:  Tara Brown reports that she had an ok day, but continues to have depression.  She reports that she is concerned with medication for sleep and asks multiple questions regarding medications and procedures.  She denies SI/HI/AVH and is attending groups.  A:  Safety checks q 15 minutes.  Emotional support provided.  Medications administered as ordered.  R:  Safety maintained on unit.

## 2015-08-11 MED ORDER — MIRTAZAPINE 15 MG PO TABS
15.0000 mg | ORAL_TABLET | Freq: Every day | ORAL | Status: DC
  Administered 2015-08-11: 15 mg via ORAL
  Filled 2015-08-11 (×3): qty 1

## 2015-08-11 NOTE — B[facility] Group Notes (Signed)
[facility] LCSW Aftercare Discharge Planning Group Note  08/11/2015 8:45 AM  Participation Quality: Alert, Appropriate and Oriented  Mood/Affect: Flat  Depression Rating: 5   Anxiety Rating: 5  Thoughts of Suicide: Pt denies SI/HI  Will you contract for safety? Yes  Current AVH: Pt denies  Plan for Discharge/Comments: Pt attended discharge planning group and actively participated in group. CSW discussed suicide prevention education with the group and encouraged them to discuss discharge planning and any relevant barriers. Pt is pleasant in group and inquired about community resources. CSW provided her with a list of ALF facilities in [facility]  Transportation Means: Pt reports access to transportation  Supports: No supports mentioned at this time  Chad Cordial, LCSWA 08/11/2015 9:16 AM

## 2015-08-11 NOTE — Progress Notes (Signed)
Patient ID: Tara Brown, female   DOB: [birth year], 54 y.o.   MRN: [id number] [facility] MD Progress Note  08/11/2015 1:54 PM Tara Brown  MRN:  [id number] Subjective:  Patient states she is improving , feels better compared to admission. Still depressed, but to lesser degree. Denies suicidal ideations , does not endorse medication side effects. Objective : I have discussed case with treatment team and patient seen. Patient reporting partial improvement of mood , less  Severely depressed. At this time states she is feeling better, and denies any suicidal ideations. She is more future oriented, and states her hope is to go home with home services , or to an ALF type environment where she can get the help she needs for daily functioning . She has difficulty with some ADLs, such as showering, bathing, dressing , due to severity of arthritis and range of motion compromise . She had foot imaging, results noted, Hospitalist following to determine best antibiotherapy/ treatment course .  Pain is chronic , but currently adequately controlled with ultram, which she tolerates well. Denies medication side effects. Visible on unit, going to groups, behavior calm, in good control.   Principal Problem: Major depressive disorder, recurrent, severe without psychotic features ([place]) Diagnosis:   Patient Active Problem List   Diagnosis Date Noted  . Osteomyelitis (B and E) [M86.9]   . Hypokalemia [E87.6]   . Anemia, iron deficiency [D50.9]   . Essential hypertension [I10]   . Severe episode of recurrent major depressive disorder, without psychotic features ([place]) [F33.2]   . Major depression ([place]) [F32.9] 03/26/2015  . Psoriatic arthritis ([name]) [L40.50] 03/11/2015  . Osteoporosis [M81.0] 03/11/2015  . Major depressive disorder, recurrent, severe without psychotic features ([place]) [F33.2] 03/11/2015  . Sepsis secondary to UTI ([place]) [A41.9, N39.0] 03/08/2015  . Leukopenia [D72.819] 03/08/2015  . Anemia of chronic  disease [D63.8] 03/08/2015   Total Time spent with patient: 20 minutes     Past Medical History:  Past Medical History  Diagnosis Date  . Osteoporosis   . Psoriatic arthritis ([place])   . Anemia   . Depression     Past Surgical History  Procedure Laterality Date  . Hip fracture surgery    . Knee surgery    . Foot surgery    . Hand surgery    . Neck surgery     Family History:  Family History  Problem Relation Age of Onset  . Stroke Neg Hx     Social History:  History  Alcohol Use No     History  Drug Use No    Social History   Social History  . Marital Status: Divorced    Spouse Name: N/A  . Number of Children: N/A  . Years of Education: N/A   Social History Main Topics  . Smoking status: Never Smoker   . Smokeless tobacco: Never Used  . Alcohol Use: No  . Drug Use: No  . Sexual Activity: No   Other Topics Concern  . None   Social History Narrative   Additional Social History:   Sleep: improved   Appetite:  Fair  Current Medications: Current Facility-Administered Medications  Medication Dose Route Frequency Provider Last Rate Last Dose  . acetaminophen (TYLENOL) tablet 650 mg  650 mg Oral Q6H PRN Delfin Gant, NP   650 mg at 08/10/15 2118  . cephALEXin (KEFLEX) capsule 500 mg  500 mg Oral 3 times per day Delfin Gant, NP   500 mg at 08/11/15 1761  . cholecalciferol (  VITAMIN D) tablet 400 Units  400 Units Oral Daily Delfin Gant, NP   400 Units at 08/11/15 0910  . ferrous sulfate tablet 325 mg  325 mg Oral Q breakfast Delfin Gant, NP   325 mg at 08/11/15 0910  . folic acid (FOLVITE) tablet 1 mg  1 mg Oral Daily Delfin Gant, NP   1 mg at 08/11/15 0911  . lisinopril (PRINIVIL,ZESTRIL) tablet 10 mg  10 mg Oral Daily Delfin Gant, NP   10 mg at 08/11/15 0910  . mirtazapine (REMERON) tablet 15 mg  15 mg Oral QHS Floreine Kingdon A Blu Mcglaun, MD      . predniSONE (DELTASONE) tablet 10 mg  10 mg Oral Q breakfast Delfin Gant,  NP   10 mg at 08/11/15 0910  . silver sulfADIAZINE (SILVADENE) 1 % cream 1 application  1 application Topical Daily Delfin Gant, NP   1 application at 78/29/56 0911  . traMADol (ULTRAM) tablet 100 mg  100 mg Oral Q8H PRN Jenne Campus, MD   100 mg at 08/11/15 2130  . zolpidem (AMBIEN) tablet 5 mg  5 mg Oral QHS PRN Delfin Gant, NP   5 mg at 08/10/15 2118    Lab Results:  Results for orders placed or performed during the hospital encounter of 08/09/15 (from the past 48 hour(s))  Basic metabolic panel     Status: Abnormal   Collection Time: 08/10/15  6:50 AM  Result Value Ref Range   Sodium 142 135 - 145 mmol/L   Potassium 4.6 3.5 - 5.1 mmol/L   Chloride 106 101 - 111 mmol/L   CO2 27 22 - 32 mmol/L   Glucose, Bld 90 65 - 99 mg/dL   BUN 14 6 - 20 mg/dL   Creatinine, Ser 1.16 (H) 0.44 - 1.00 mg/dL   Calcium 10.0 8.9 - 10.3 mg/dL   GFR calc non Af Amer 52 (L) >60 mL/min   GFR calc Af Amer 60 (L) >60 mL/min    Comment: (NOTE) The eGFR has been calculated using the CKD EPI equation. This calculation has not been validated in all clinical situations. eGFR's persistently <60 mL/min signify possible Chronic Kidney Disease.    Anion gap 9 5 - 15    Comment: Performed at [facility]    Physical Findings: AIMS: Facial and Oral Movements Muscles of Facial Expression: None, normal Lips and Perioral Area: None, normal Jaw: None, normal Tongue: None, normal,Extremity Movements Upper (arms, wrists, hands, fingers): None, normal Lower (legs, knees, ankles, toes): None, normal, Trunk Movements Neck, shoulders, hips: None, normal, Overall Severity Severity of abnormal movements (highest score from questions above): None, normal Incapacitation due to abnormal movements: None, normal Patient's awareness of abnormal movements (rate only patient's report): No Awareness, Dental Status Current problems with teeth and/or dentures?: No Does patient usually wear  dentures?: No  CIWA:    COWS:     Musculoskeletal: Strength & Muscle Tone: within normal limits- range of motion  Affected by arthritis  Gait & Station: normal Patient leans: N/A  Psychiatric Specialty Exam: ROS chronic joint pain related to chronic arthritis, chronic ulceration with drainage and erythema on L toe, no fever, no chills   Blood pressure 128/91, pulse 85, temperature 97.2 F (36.2 C), temperature source Oral, resp. rate 16, height _0  (1.676 m), weight 104 lb (47.174 kg).Body mass index is 16.79 kg/(m^2).  General Appearance: Fairly Groomed  Engineer, water::  Good  Speech:  Normal  Rate  Volume:  Normal  Mood:  Depressed, but improving compared to admission  Affect:  Still constricted, but does smile briefly at times   Thought Process:  Linear  Orientation:  Other:  fully alert and attentive   Thought Content:  no hallucinations, no delusions, not internally preoccupied   Suicidal Thoughts:  No denies plan or intention of hurting self or of SI  Homicidal Thoughts:  No  Memory:  recent and remote grossly intact   Judgement:  Other:  improving  Insight:  Present  Psychomotor Activity:  More visible on unit, motor activity difficulted by her severe arthritis   Concentration:  Good  Recall:  good  Fund of Knowledge:Good  Language: Good  Akathisia:  Negative  Handed:  Right  AIMS (if indicated):     Assets:  Communication Skills Desire for Improvement Resilience  ADL's:   Fair   Cognition: WNL  Sleep:  Number of Hours: 6.75  Assessment - partial improvement of mood and affect. Still depressed, but to lesser degree, and at this time not suicidal and future oriented, focusing more on disposition options. Hospitalist following for management of toe / foot infection. Pain generally well controlled on Ultram, which she tolerates well. Slept better on Remeron and is tolerating this medication well thus far.  Treatment Plan Summary: Daily contact with patient to assess  and evaluate symptoms and progress in treatment, Medication management, Plan inpatient admission and medications as below Continue to encourage milieu, group participation to work on coping skills and symptom reduction Increase  Remeron to 15  mgrs QHS for depression, and to help with insomnia Continue Ultram  100 mgrs Q 8 hours PRNs for chronic pain Continue work up, management for chronic infection As per CSW, will obtain PT / OT consult to help determine functional capacity/ best level of care post discharge  Ameya Kutz, [place] 08/11/2015, 1:54 PM

## 2015-08-11 NOTE — NC FL2 (Signed)
[place] MEDICAID FL2 LEVEL OF CARE SCREENING TOOL     IDENTIFICATION  Patient Name: Tara Brown Birthdate: 1961/02/28 Sex: female Admission Date (Current Location): 08/09/2015  [facility] and [place] Number: Producer, television/film/video and Address:  [facility], [address] [place], [place] [address]      Provider Number: [id number]  Attending Physician Name and Address:  Craige Cotta, MD  Relative Name and Phone Number:       Current Level of Care: Hospital Recommended Level of Care: Skilled Nursing Facility Prior Approval Number:    Date Approved/Denied:   PASRR Number:    Discharge Plan: SNF    Current Diagnoses: Patient Active Problem List   Diagnosis Date Noted  . Osteomyelitis (HCC)   . Hypokalemia   . Anemia, iron deficiency   . Essential hypertension   . Severe episode of recurrent major depressive disorder, without psychotic features (HCC)   . Major depression (HCC) 03/26/2015  . Psoriatic arthritis (HCC) 03/11/2015  . Osteoporosis 03/11/2015  . 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 Sepsis secondary to UTI (HCC) 03/08/2015  . Leukopenia 03/08/2015  . Anemia of chronic disease 03/08/2015    Orientation ACTIVITIES/SOCIAL BLADDER RESPIRATION    Self, Time, Situation, Place    Continent Normal  BEHAVIORAL SYMPTOMS/MOOD NEUROLOGICAL BOWEL NUTRITION STATUS      Continent Diet  PHYSICIAN VISITS COMMUNICATION OF NEEDS Height & Weight Skin    Verbally 5\' 6"  (167.6 cm) 104 lbs. Bruising          AMBULATORY STATUS RESPIRATION    Assist independent Normal      Personal Care Assistance Level of Assistance  Bathing Bathing Assistance: Limited assistance          Functional Limitations Info  Contractures       Contractures Info: Adequate     SPECIAL CARE FACTORS FREQUENCY  OT (By licensed OT), PT (By licensed PT)     PT Frequency: 5x/week OT Frequency:  5x/week           Additional Factors Info  Psychotropic     Psychotropic Info: see MAR         Current Medications (08/11/2015):  This is the current hospital active medication list Current Facility-Administered Medications  Medication Dose Route Frequency Provider Last Rate Last Dose  . acetaminophen (TYLENOL) tablet 650 mg  650 mg Oral Q6H PRN 08/13/2015, NP   650 mg at 08/10/15 2118  . cephALEXin (KEFLEX) capsule 500 mg  500 mg Oral 3 times per day 2119, NP   500 mg at 08/11/15 08/13/15  . cholecalciferol (VITAMIN D) tablet 400 Units  400 Units Oral Daily 0076, NP   400 Units at 08/11/15 0910  . ferrous sulfate tablet 325 mg  325 mg Oral Q breakfast 08/13/15, NP   325 mg at 08/11/15 0910  . folic acid (FOLVITE) tablet 1 mg  1 mg Oral Daily 08/13/15, NP   1 mg at 08/11/15 0911  . lisinopril (PRINIVIL,ZESTRIL) tablet 10 mg  10 mg Oral Daily 08/13/15, NP   10 mg at 08/11/15 0910  . mirtazapine (REMERON) tablet 7.5 mg  7.5 mg Oral QHS 08/13/15 Cobos, MD   7.5 mg at 08/10/15 2118  . predniSONE (DELTASONE) tablet 10 mg  10 mg Oral Q breakfast 2119, NP   10 mg at 08/11/15 0910  . silver  sulfADIAZINE (SILVADENE) 1 % cream 1 application  1 application Topical Daily Earney Navy, NP   1 application at 08/11/15 0911  . traMADol (ULTRAM) tablet 100 mg  100 mg Oral Q8H PRN Craige Cotta, MD   100 mg at 08/11/15 5465  . zolpidem (AMBIEN) tablet 5 mg  5 mg Oral QHS PRN Earney Navy, NP   5 mg at 08/10/15 2118     Discharge Medications: Please see discharge summary for a list of discharge medications.  Relevant Imaging Results:  Relevant Lab Results:  Recent Labs    Additional Information    Sallee Lange, LCSW

## 2015-08-11 NOTE — [facility] Group Notes (Signed)
[facility] LCSW Group Therapy 08/11/2015 1:15 PM  Type of Therapy: Group Therapy- Emotion Regulation  Participation Level: Minimal  Participation Quality:  Reserved  Affect: Appropriate  Cognitive: Alert and Oriented   Insight:  Developing/Improving  Engagement in Therapy: Developing/Improving and Engaged   Modes of Intervention: Clarification, Confrontation, Discussion, Education, Exploration, Limit-setting, Orientation, Problem-solving, Rapport Building, Dance movement psychotherapist, Socialization and Support  Summary of Progress/Problems: The topic for group today was emotional regulation. This group focused on both positive and negative emotion identification and allowed group members to process ways to identify feelings, regulate negative emotions, and find healthy ways to manage internal/external emotions. Group members were asked to reflect on a time when their reaction to an emotion led to a negative outcome and explored how alternative responses using emotion regulation would have benefited them. Group members were also asked to discuss a time when emotion regulation was utilized when a negative emotion was experienced. Pt was reserved in group discussion but did identify journaling as a coping skill she hasn't used because she is always concerned that someone will find it and read it. Otherwise, Pt did not contribute to discussion but was observed to be attentive.    Chad Cordial, LCSWA 08/11/2015 3:34 PM

## 2015-08-11 NOTE — Plan of Care (Signed)
Problem: Diagnosis: Increased Risk For Suicide Attempt Goal: LTG-Patient Will Report Improved Mood and Deny Suicidal LTG (by discharge) Patient will report improved mood and deny suicidal ideation.  Outcome: Progressing Pt denied SI and contracted for safety.     

## 2015-08-11 NOTE — Progress Notes (Signed)
D: Patient alert and oriented x 4. Patient denies SI/HI/AVH. Patient complained of pain 8/10 Tylenol was given with effective results.  A: Staff to monitor Q 15 mins for safety. Encouragement and support offered. Scheduled medications administered per orders. R: Patient remains safe on the unit. Patient attended group tonight. Patient visible on hte unit and interacting with peers. Patient taking administered medications.

## 2015-08-11 NOTE — Progress Notes (Signed)
Recreation Therapy Notes  Date: 11.30.2016 Time: 9:30am Location: 300 Hall Group Room  Group Topic: Stress Management  Goal Area(s) Addresses:  Patient will actively participate in stress management techniques presented during session.   Behavioral Response: Did not attend.   [name] Retina Bernardy, LRT/CTRS        Jearl Klinefelter 08/11/2015 7:28 PM

## 2015-08-11 NOTE — Progress Notes (Signed)
D. Pt present with flat affect and depressed mood on the unit today. Pt stated that she had a good night sleep, but had a hard time getting up in the morning due to arthritis. Pt complained of pain but did not want anything for pain this morning.  Her depression is 4, hopelessness is a 0 and anxiety is a 4. Pt has been cooperative with care and her medications, her goal is to work towards her discharge. Pt's safety ensured with 15 minute and environmental checks. Pt currently denies SI/HI and A/V hallucinations. Pt verbally agrees to seek staff if SI/HI or A/VH occurs and to consult with staff before acting on these thoughts. Will continue POC.

## 2015-08-12 ENCOUNTER — Inpatient Hospital Stay (HOSPITAL_COMMUNITY)
Admission: AD | Admit: 2015-08-12 | Discharge: 2015-08-13 | DRG: 546 | Disposition: A | Discharge status: Home / Self Care | Payer: Medicare Other | Source: Ambulatory Visit | Attending: Internal Medicine | Admitting: Internal Medicine

## 2015-08-12 DIAGNOSIS — N183 Chronic kidney disease, stage 3 unspecified: Secondary | ICD-10-CM | POA: Diagnosis present

## 2015-08-12 DIAGNOSIS — I129 Hypertensive chronic kidney disease with stage 1 through stage 4 chronic kidney disease, or unspecified chronic kidney disease: Secondary | ICD-10-CM | POA: Diagnosis present

## 2015-08-12 DIAGNOSIS — M869 Osteomyelitis, unspecified: Secondary | ICD-10-CM | POA: Diagnosis not present

## 2015-08-12 DIAGNOSIS — E876 Hypokalemia: Secondary | ICD-10-CM | POA: Diagnosis present

## 2015-08-12 DIAGNOSIS — D509 Iron deficiency anemia, unspecified: Secondary | ICD-10-CM | POA: Diagnosis present

## 2015-08-12 DIAGNOSIS — L4050 Arthropathic psoriasis, unspecified: Secondary | ICD-10-CM | POA: Diagnosis present

## 2015-08-12 DIAGNOSIS — M1990 Unspecified osteoarthritis, unspecified site: Secondary | ICD-10-CM | POA: Diagnosis present

## 2015-08-12 DIAGNOSIS — Z79899 Other long term (current) drug therapy: Secondary | ICD-10-CM | POA: Diagnosis not present

## 2015-08-12 DIAGNOSIS — F332 Major depressive disorder, recurrent severe without psychotic features: Secondary | ICD-10-CM | POA: Diagnosis present

## 2015-08-12 DIAGNOSIS — M659 Synovitis and tenosynovitis, unspecified: Secondary | ICD-10-CM

## 2015-08-12 DIAGNOSIS — N390 Urinary tract infection, site not specified: Secondary | ICD-10-CM | POA: Diagnosis present

## 2015-08-12 DIAGNOSIS — M810 Age-related osteoporosis without current pathological fracture: Secondary | ICD-10-CM | POA: Diagnosis present

## 2015-08-12 DIAGNOSIS — L97529 Non-pressure chronic ulcer of other part of left foot with unspecified severity: Secondary | ICD-10-CM | POA: Diagnosis present

## 2015-08-12 DIAGNOSIS — D638 Anemia in other chronic diseases classified elsewhere: Secondary | ICD-10-CM | POA: Diagnosis present

## 2015-08-12 DIAGNOSIS — R45851 Suicidal ideations: Secondary | ICD-10-CM | POA: Diagnosis present

## 2015-08-12 DIAGNOSIS — S91332D Puncture wound without foreign body, left foot, subsequent encounter: Secondary | ICD-10-CM

## 2015-08-12 DIAGNOSIS — M79672 Pain in left foot: Secondary | ICD-10-CM | POA: Diagnosis present

## 2015-08-12 DIAGNOSIS — M86172 Other acute osteomyelitis, left ankle and foot: Secondary | ICD-10-CM | POA: Diagnosis not present

## 2015-08-12 DIAGNOSIS — L089 Local infection of the skin and subcutaneous tissue, unspecified: Secondary | ICD-10-CM | POA: Diagnosis not present

## 2015-08-12 DIAGNOSIS — R8299 Other abnormal findings in urine: Secondary | ICD-10-CM | POA: Diagnosis not present

## 2015-08-12 DIAGNOSIS — I10 Essential (primary) hypertension: Secondary | ICD-10-CM | POA: Diagnosis not present

## 2015-08-12 HISTORY — DX: Major depressive disorder, recurrent severe without psychotic features: F33.2

## 2015-08-12 LAB — CBC WITH DIFFERENTIAL/PLATELET
Basophils Absolute: 0 10*3/uL (ref 0.0–0.1)
Basophils Relative: 0 %
Eosinophils Absolute: 0 10*3/uL (ref 0.0–0.7)
Eosinophils Relative: 0 %
HEMATOCRIT: 26.5 % — AB (ref 36.0–46.0)
HEMOGLOBIN: 7.9 g/dL — AB (ref 12.0–15.0)
LYMPHS ABS: 2 10*3/uL (ref 0.7–4.0)
Lymphocytes Relative: 35 %
MCH: 22.8 pg — AB (ref 26.0–34.0)
MCHC: 29.8 g/dL — AB (ref 30.0–36.0)
MCV: 76.6 fL — AB (ref 78.0–100.0)
MONO ABS: 0.4 10*3/uL (ref 0.1–1.0)
MONOS PCT: 7 %
NEUTROS ABS: 3.3 10*3/uL (ref 1.7–7.7)
NEUTROS PCT: 58 %
Platelets: 409 10*3/uL — ABNORMAL HIGH (ref 150–400)
RBC: 3.46 MIL/uL — ABNORMAL LOW (ref 3.87–5.11)
RDW: 17.9 % — AB (ref 11.5–15.5)
WBC: 5.8 10*3/uL (ref 4.0–10.5)

## 2015-08-12 LAB — COMPREHENSIVE METABOLIC PANEL
ALBUMIN: 3.1 g/dL — AB (ref 3.5–5.0)
ALK PHOS: 71 U/L (ref 38–126)
ALT: 7 U/L — ABNORMAL LOW (ref 14–54)
ANION GAP: 5 (ref 5–15)
AST: 12 U/L — ABNORMAL LOW (ref 15–41)
BILIRUBIN TOTAL: 0.4 mg/dL (ref 0.3–1.2)
BUN: 25 mg/dL — ABNORMAL HIGH (ref 6–20)
CALCIUM: 8.6 mg/dL — AB (ref 8.9–10.3)
CO2: 29 mmol/L (ref 22–32)
Chloride: 104 mmol/L (ref 101–111)
Creatinine, Ser: 1.24 mg/dL — ABNORMAL HIGH (ref 0.44–1.00)
GFR, EST AFRICAN AMERICAN: 55 mL/min — AB (ref >60)
GFR, EST NON AFRICAN AMERICAN: 48 mL/min — AB (ref >60)
GLUCOSE: 109 mg/dL — AB (ref 65–99)
POTASSIUM: 3.7 mmol/L (ref 3.5–5.1)
Sodium: 138 mmol/L (ref 135–145)
TOTAL PROTEIN: 7.6 g/dL (ref 6.5–8.1)

## 2015-08-12 LAB — APTT: APTT: 34 s (ref 24–37)

## 2015-08-12 LAB — TROPONIN I

## 2015-08-12 LAB — TYPE AND SCREEN
ABO/RH(D): O POS
Antibody Screen: NEGATIVE

## 2015-08-12 LAB — C-REACTIVE PROTEIN: CRP: 5.7 mg/dL — ABNORMAL HIGH (ref <1.0)

## 2015-08-12 LAB — SEDIMENTATION RATE: SED RATE: 90 mm/h — AB (ref 0–22)

## 2015-08-12 LAB — PROTIME-INR
INR: 1.06 (ref 0.00–1.49)
PROTHROMBIN TIME: 14 s (ref 11.6–15.2)

## 2015-08-12 MED ORDER — DOXYCYCLINE HYCLATE 100 MG PO TABS
100.0000 mg | ORAL_TABLET | Freq: Two times a day (BID) | ORAL | Status: DC
  Administered 2015-08-12: 100 mg via ORAL
  Filled 2015-08-12 (×3): qty 1

## 2015-08-12 MED ORDER — SODIUM CHLORIDE 0.9 % IJ SOLN
3.0000 mL | Freq: Two times a day (BID) | INTRAMUSCULAR | Status: DC
  Administered 2015-08-12: 3 mL via INTRAVENOUS

## 2015-08-12 MED ORDER — ALUM & MAG HYDROXIDE-SIMETH 200-200-20 MG/5ML PO SUSP: 30.0000 mL | Freq: Four times a day (QID) | ORAL | Status: DC | PRN

## 2015-08-12 MED ORDER — AMLODIPINE BESYLATE 5 MG PO TABS
5.0000 mg | ORAL_TABLET | Freq: Every day | ORAL | Status: DC
  Administered 2015-08-12 – 2015-08-13 (×2): 5 mg via ORAL
  Filled 2015-08-12 (×2): qty 1

## 2015-08-12 MED ORDER — SODIUM CHLORIDE 0.9 % IV SOLN: 250.0000 mL | INTRAVENOUS | Status: DC | PRN

## 2015-08-12 MED ORDER — ONDANSETRON HCL 4 MG PO TABS: 4.0000 mg | ORAL_TABLET | Freq: Four times a day (QID) | ORAL | Status: DC | PRN

## 2015-08-12 MED ORDER — ENOXAPARIN SODIUM 40 MG/0.4ML ~~LOC~~ SOLN
40.0000 mg | SUBCUTANEOUS | Status: DC
  Filled 2015-08-12: qty 0.4

## 2015-08-12 MED ORDER — HYDRALAZINE HCL 20 MG/ML IJ SOLN: 5.0000 mg | INTRAMUSCULAR | Status: DC | PRN

## 2015-08-12 MED ORDER — HYDROXYZINE HCL 50 MG/ML IM SOLN
25.0000 mg | Freq: Four times a day (QID) | INTRAMUSCULAR | Status: DC | PRN
  Filled 2015-08-12: qty 0.5

## 2015-08-12 MED ORDER — SILVER SULFADIAZINE 1 % EX CREA
1.0000 "application " | TOPICAL_CREAM | Freq: Every day | CUTANEOUS | Status: DC
  Administered 2015-08-13: 1 via TOPICAL
  Filled 2015-08-12: qty 85

## 2015-08-12 MED ORDER — SODIUM CHLORIDE 0.9 % IV SOLN
INTRAVENOUS | Status: DC
  Administered 2015-08-12: 23:00:00 via INTRAVENOUS

## 2015-08-12 MED ORDER — HEPARIN SODIUM (PORCINE) 5000 UNIT/ML IJ SOLN
5000.0000 [IU] | Freq: Three times a day (TID) | INTRAMUSCULAR | Status: DC
  Administered 2015-08-12 – 2015-08-13 (×3): 5000 [IU] via SUBCUTANEOUS
  Filled 2015-08-12 (×5): qty 1

## 2015-08-12 MED ORDER — VANCOMYCIN HCL IN DEXTROSE 1-5 GM/200ML-% IV SOLN
1000.0000 mg | Freq: Once | INTRAVENOUS | Status: AC
  Administered 2015-08-12: 1000 mg via INTRAVENOUS
  Filled 2015-08-12: qty 200

## 2015-08-12 MED ORDER — FERROUS SULFATE 325 (65 FE) MG PO TABS
325.0000 mg | ORAL_TABLET | Freq: Every day | ORAL | Status: DC
  Filled 2015-08-12 (×2): qty 1

## 2015-08-12 MED ORDER — ZOLPIDEM TARTRATE 10 MG PO TABS: 10.0000 mg | ORAL_TABLET | Freq: Every evening | ORAL | Status: DC | PRN

## 2015-08-12 MED ORDER — TRAMADOL HCL 50 MG PO TABS
100.0000 mg | ORAL_TABLET | Freq: Four times a day (QID) | ORAL | Status: DC | PRN
  Administered 2015-08-12 – 2015-08-13 (×2): 100 mg via ORAL
  Filled 2015-08-12 (×2): qty 2

## 2015-08-12 MED ORDER — POLYETHYLENE GLYCOL 3350 17 G PO PACK
17.0000 g | PACK | Freq: Every day | ORAL | Status: DC
  Filled 2015-08-12: qty 1

## 2015-08-12 MED ORDER — ACETAMINOPHEN 650 MG RE SUPP: 650.0000 mg | Freq: Four times a day (QID) | RECTAL | Status: DC | PRN

## 2015-08-12 MED ORDER — ONDANSETRON HCL 4 MG/2ML IJ SOLN: 4.0000 mg | Freq: Four times a day (QID) | INTRAMUSCULAR | Status: DC | PRN

## 2015-08-12 MED ORDER — ACETAMINOPHEN 325 MG PO TABS: 650.0000 mg | ORAL_TABLET | Freq: Four times a day (QID) | ORAL | Status: DC | PRN

## 2015-08-12 MED ORDER — ZOLPIDEM TARTRATE 5 MG PO TABS
5.0000 mg | ORAL_TABLET | Freq: Every evening | ORAL | Status: DC | PRN
  Administered 2015-08-13: 5 mg via ORAL
  Filled 2015-08-12: qty 1

## 2015-08-12 MED ORDER — MIRTAZAPINE 15 MG PO TBDP
15.0000 mg | ORAL_TABLET | Freq: Every day | ORAL | Status: DC
  Administered 2015-08-12: 15 mg via ORAL
  Filled 2015-08-12 (×2): qty 1

## 2015-08-12 MED ORDER — ESCITALOPRAM OXALATE 20 MG PO TABS
20.0000 mg | ORAL_TABLET | Freq: Every day | ORAL | Status: DC
Start: 2015-08-12 — End: 2015-08-13
  Administered 2015-08-12 – 2015-08-13 (×2): 20 mg via ORAL
  Filled 2015-08-12 (×2): qty 1

## 2015-08-12 MED ORDER — SODIUM CHLORIDE 0.9 % IJ SOLN: 3.0000 mL | Freq: Two times a day (BID) | INTRAMUSCULAR | Status: DC

## 2015-08-12 MED ORDER — POLYETHYLENE GLYCOL 3350 17 G PO PACK: 17.0000 g | PACK | Freq: Every day | ORAL | Status: DC | PRN

## 2015-08-12 MED ORDER — PIPERACILLIN-TAZOBACTAM 3.375 G IVPB
3.3750 g | Freq: Three times a day (TID) | INTRAVENOUS | Status: DC
  Administered 2015-08-12 – 2015-08-13 (×2): 3.375 g via INTRAVENOUS
  Filled 2015-08-12 (×3): qty 50

## 2015-08-12 MED ORDER — VANCOMYCIN HCL 500 MG IV SOLR
500.0000 mg | Freq: Two times a day (BID) | INTRAVENOUS | Status: DC
  Administered 2015-08-13: 500 mg via INTRAVENOUS
  Filled 2015-08-12: qty 500

## 2015-08-12 MED ORDER — FOLIC ACID 1 MG PO TABS
1.0000 mg | ORAL_TABLET | Freq: Every day | ORAL | Status: DC
  Administered 2015-08-13: 1 mg via ORAL
  Filled 2015-08-12: qty 1

## 2015-08-12 MED ORDER — SODIUM CHLORIDE 0.9 % IJ SOLN: 3.0000 mL | INTRAMUSCULAR | Status: DC | PRN

## 2015-08-12 MED ORDER — VITAMIN D3 25 MCG (1000 UNIT) PO TABS
1000.0000 [IU] | ORAL_TABLET | Freq: Every day | ORAL | Status: DC
  Administered 2015-08-13: 1000 [IU] via ORAL
  Filled 2015-08-12: qty 1

## 2015-08-12 NOTE — Progress Notes (Addendum)
Progress Note   Tara Brown VPX:[id number] DOB: 02/16/1961 DOA: 08/09/2015 PCP: Hermelinda Medicus, MD   Brief Narrative:   Tara Brown is an 54 y.o. female with past medical history of hypertension, depression, has had hardware place for fracture in ankle in past. She has followed with podiatrist in [place] and was told back in July that her hardware needs to come out once the infection of left great toe heals. She has apparently followed up regularly and has noticed pus draining from the wound on the left great toe.   Assessment/Plan:   Principal Problem:   Major depressive disorder, recurrent, severe without psychotic features ([place]) - Management per psychiatry.  Active Problems:   Cellulitis and synovitis of left first MTP joint with associated arthropathy, r/o osteomyelitis ([place]) - CT findings as noted below. Will transfer to [facility] for orthopedic consult. - Continue wound care per wound RN recommendations. - Recommend orthopedic consultation to determine if I&D needed. ID already consulted per Dr. Parke Poisson. - On Keflex. Would change to IV antibiotics with Zosyn/Vancomycin to include MRSA coverage.  - Check ESR/CRP.    Hypokalemia - Repleted.    Anemia, iron deficiency - Continue iron supplementation.    Essential hypertension - Continue Lisinopril.   Procedures and diagnostic studies:   Ct Foot Left W Contrast  08/11/2015  CLINICAL DATA:  Left great toe open sores and erythema for 1 week. Possible hardware rejection. Evaluate for osteomyelitis. Initial encounter. EXAM: CT OF THE LEFT FOOT WITH CONTRAST TECHNIQUE: Multidetector CT imaging was performed following the standard protocol during bolus administration of intravenous contrast. CONTRAST:  176m OMNIPAQUE IOHEXOL 300 MG/ML  SOLN COMPARISON:  None. FINDINGS: The entire foot and ankle were imaged. The bones are diffusely demineralized. 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 There is some bone resorption surrounding the prosthetic components within the first metatarsal head and proximal phalangeal base. In addition, there is dorsal and lateral dislocation at the first MTP joint. The interphalangeal joint of the great toe may be partially ankylosed. Advanced arthropathic changes are present at the additional metatarsal phalangeal heads with flattening. There has been possible previous resection or erosion of the second metatarsal head. There is also dorsal lateral dislocation at the second MTP joint. Milder arthropathic changes are present throughout the midfoot without subluxation at the Lisfranc joint. The subtalar and tibiotalar joints demonstrate no significant findings. There is possible chronic posttraumatic deformity of the distal fibula. There is generalized atrophy of the visualized foot and lower leg musculature. The ankle tendons appear intact. There is nonspecific soft tissue swelling in the great toe without evidence of focal fluid collection, foreign body or soft tissue emphysema. There is an effusion and probable synovial thickening at the first MTP joint. IMPRESSION: 1. Nonspecific soft tissue thickening, fusion and probable synovitis of the first MTP joint. No focal soft tissue abscess identified. 2. Diffuse arthropathic changes throughout the forefoot status post 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 dislocated. There is bony resorption around the first MTP prosthetic components which is nonspecific, although more likely due to particle disease or the patient's underlying arthropathy than superimposed osteomyelitis. Correlation with prior radiographs would be helpful to assess the chronicity of these findings. Electronically Signed   By: WRichardean SaleM.D.   On: 08/11/2015 07:54    Anti-Infectives:   Anti-infectives    Start     Dose/Rate Route Frequency Ordered Stop  08/09/15  0115  cephALEXin (KEFLEX) capsule 500 mg     500 mg Oral 3 times per day 08/09/15 0114 08/14/15 1359      Subjective:   Tara Brown reports drainage to superior and inferior aspect of left great toe MTP joint.  The joint is tender.  No N/V/D.  Objective:    Filed Vitals:   08/10/15 0601 08/10/15 0843 08/11/15 0629 08/11/15 0630  BP: 129/89 131/78 138/90 128/91  Pulse: 86  74 85  Temp:   97.2 F (36.2 C)   TempSrc:   Oral   Resp:      Height:      Weight:       No intake or output data in the 24 hours ending 08/12/15 0710 Filed Weights   08/09/15 0035  Weight: 47.174 kg (104 lb)    Exam: Gen:  NAD Cardiovascular:  RRR, No M/R/G Respiratory:  Lungs CTAB Gastrointestinal:  Abdomen soft, NT/ND, + BS Extremities:  Left MTP with intense erythema and purulent drainage superior and inferior aspects of joint highly suspicious of underlying osteomyelitis given retained hardware.   Data Reviewed:    Labs: Basic Metabolic Panel:  Recent Labs Lab 08/07/15 0950 08/10/15 0650  NA 135 142  K 3.1* 4.6  CL 101 106  CO2 23 27  GLUCOSE 192* 90  BUN 10 14  CREATININE 0.96 1.16*  CALCIUM 9.0 10.0   GFR Estimated Creatinine Clearance: 40.4 mL/min (by C-G formula based on Cr of 1.16). Liver Function Tests:  Recent Labs Lab 08/07/15 0950  AST 16  ALT 6*  ALKPHOS 73  BILITOT 0.5  PROT 7.8  ALBUMIN 3.0*   CBC:  Recent Labs Lab 08/07/15 0950  WBC 6.9  NEUTROABS 4.7  HGB 8.3*  HCT 27.8*  MCV 75.1*  PLT 387   Cardiac Enzymes:  Recent Labs Lab 08/07/15 0950  TROPONINI <0.03   Microbiology Recent Results (from the past 240 hour(s))  Urine culture     Status: None   Collection Time: 08/07/15 10:20 AM  Result Value Ref Range Status   Specimen Description URINE, RANDOM  Final   Special Requests NONE  Final   Culture   Final    MULTIPLE SPECIES PRESENT, SUGGEST RECOLLECTION Performed at [facility]    Report Status 08/09/2015 FINAL  Final      Medications:   . cephALEXin  500 mg Oral 3 times per day  . cholecalciferol  400 Units Oral Daily  . ferrous sulfate  325 mg Oral Q breakfast  . folic acid  1 mg Oral Daily  . lisinopril  10 mg Oral Daily  . mirtazapine  15 mg Oral QHS  . predniSONE  10 mg Oral Q breakfast  . silver sulfADIAZINE  1 application Topical Daily   Continuous Infusions:   Time spent: 1 hour.   LOS: 3 days   [facility] Pager [phone]. If unable to reach me by pager, please call my cell phone at [phone].  *Please refer to [email or web address], password [email or web address] to get updated schedule on who will round on this patient, as hospitalists switch teams weekly. If 7PM-7AM, please contact night-coverage at [email or web address], password [email or web address] for any overnight needs.  08/12/2015, 7:10 AM

## 2015-08-12 NOTE — Progress Notes (Signed)
D: Patient alert and oriented x 4. Patient denies SI/HI/AVH. Patient states pain at the time of assessment was a 4 and refused medication.  Patient spoke to this writer about possibility getting a PICC line to start receiving abt treatment here in the near future. This Clinical research associate reasurred patient was it would be ok.  A: Staff to monitor Q 15 mins for safety. Encouragement and support offered. Scheduled medications administered per orders. R: Patient remains safe on the unit. Patient attended group tonight. Patient visible on hte unit and interacting with peers. Patient taking administered medications.

## 2015-08-12 NOTE — H&P (Addendum)
[facility] History and Physical  Tara Brown GGY:[id number] DOB: 05/15/1961 DOA: 08/12/2015  Referring physician: ED physician PCP: Hermelinda Medicus, MD  Specialists:   Chief Complaint: left foot pain  HPI: Tara Brown is a 54 y.o. 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  Patient is transfered from [facility] unit. She was recently hospitalized to [facility] on 11/27 due to severe recurrent depression and suicidal ideation. She was treated with Lexapro and Remeron with significant improvement. Because of chronic left foot infection, patient is transferred to medical service.  Patient reports that she had hardware placed to left first MTP joint 30 years ago. She developed infection in that joint in July. She has been followed by Dr. Claiborne Billings in [facility]. She was treated with 4 antibiotics without improvement. She still has redness, tenderness and warmth in left 1st MTP. She was seen in [facility] in July and culture swab showed MSSA. She has small openings in anterior and posterior of left 1st MTP with purulent discharge. Patient does not have fever, chills, nausea, vomiting. No chest pain, shortness of breath, abdominal pain, diarrhea, unilateral weakness. She denies symptoms of UTI.   She had CT-left foot which did not show osteomyelitis.ID was consulted. Dr. Linus Salmons thought that more antibiotics will not cure this, particularly with hardware in that foot.He recommended surgergical approach for healing and continue on Keflex for 7 more days pending evaluation by surgeon.There is no lab test were done today.  CT-left foot 08/10/15:  1. Nonspecific soft tissue thickening, fusion and probable synovitis of the first MTP joint. No focal soft tissue abscess identified. 2. Diffuse arthropathic changes throughout the forefoot status post  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 dislocated. There is bony resorption around the first MTP prosthetic components which is nonspecific, although more likely due to particle disease or the patient's underlying arthropathy than superimposed osteomyelitis.  Where does patient live?   At home   Can patient participate in ADLs?  Yes   Review of Systems:   General: no fevers, chills, no changes in body weight, has fatigue HEENT: no blurry vision, hearing changes or sore throat Pulm: no dyspnea, coughing, wheezing CV: no chest pain, palpitations Abd: no nausea, vomiting, abdominal pain, diarrhea, constipation GU: no dysuria, burning on urination, increased urinary frequency, hematuria  Ext: no leg edema Neuro: no unilateral weakness, numbness, or tingling, no vision change or hearing loss Skin: There are small openings in the left first MTP anteriorly and posteriorly.  MSK: No muscle spasm, no deformity, no limitation of range of movement in spin Heme: No easy bruising.  Travel history: No recent long distant travel.  Allergy: No Known Allergies  Past Medical History  Diagnosis Date  . Osteoporosis   . Psoriatic arthritis ([place])   . Anemia   . Depression     Past Surgical History  Procedure Laterality Date  . Hip fracture surgery    . Knee surgery    . Foot surgery    . Hand surgery    . Neck surgery      Social History:  reports that she has never smoked. She has never used smokeless tobacco. She reports that she does not drink alcohol or use illicit drugs.  Family History:  Family History  Problem Relation Age of Onset  . Stroke Neg Hx  Prior to Admission medications   Medication Sig Start Date End Date Taking? Authorizing Provider  Cholecalciferol (VITAMIN D PO) Take 1 capsule by mouth daily.    Historical Provider, MD  ferrous sulfate 325 (65 FE) MG tablet Take 325 mg by mouth daily with breakfast.    Historical Provider, MD  folic  acid (FOLVITE) 1 MG tablet Take 1 mg by mouth daily.    Historical Provider, MD  lisinopril (PRINIVIL,ZESTRIL) 40 MG tablet Take 40 mg by mouth daily. 02/03/15   Historical Provider, MD  predniSONE (DELTASONE) 10 MG tablet Take 10 mg by mouth daily with breakfast.    Historical Provider, MD  SSD 1 % cream Apply 1 application topically daily. 05/25/15   Historical Provider, MD  traMADol (ULTRAM) 50 MG tablet Take 100 mg by mouth every 6 (six) hours as needed for moderate pain.     Historical Provider, MD  zolpidem (AMBIEN) 10 MG tablet Take 10 mg by mouth at bedtime as needed for sleep.    Historical Provider, MD    Physical Exam: Filed Vitals:   08/12/15 2022 08/13/15 0507  BP: 142/80 140/75  Pulse: 89 62  Temp: 98 F (36.7 C) 98 F (36.7 C)  TempSrc: Oral Oral  Resp: 20 17  SpO2: 100% 98%   General: Not in acute distress HEENT:       Eyes: PERRL, EOMI, no scleral icterus.       ENT: No discharge from the ears and nose, no pharynx injection, no tonsillar enlargement.        Neck: No JVD, no bruit, no mass felt. Heme: No neck lymph node enlargement. Cardiac: S1/S2, RRR, No murmurs, No gallops or rubs. Pulm: No rales, wheezing, rhonchi or rubs. Abd: Soft, nondistended, nontender, no rebound pain, no organomegaly, BS present. Ext: No pitting leg edema bilaterally. 2+DP/PT pulse bilaterally. Musculoskeletal: No joint deformities, No joint redness or warmth, no limitation of ROM in spin. Skin: No rashes. There are a small openings in the left first MTP anteriorly and posteriorly. The left first MTP is tender, red, warm. Neuro: Alert, oriented X3, cranial nerves II-XII grossly intact, muscle strength 5/5 in all extremities, sensation to light touch intact. Psych: Patient is not psychotic, no suicidal or hemocidal ideation.  Labs on Admission:  Basic Metabolic Panel:  Recent Labs Lab 08/07/15 0950 08/10/15 0650 08/12/15 2254 08/13/15 0326  NA 135 142 138 140  K 3.1* 4.6 3.7 3.7   CL 101 106 104 107  CO2 23 27 29 25   GLUCOSE 192* 90 109* 99  BUN 10 14 25* 23*  CREATININE 0.96 1.16* 1.24* 1.11*  CALCIUM 9.0 10.0 8.6* 8.4*   Liver Function Tests:  Recent Labs Lab 08/07/15 0950 08/12/15 2254 08/13/15 0326  AST 16 12* 12*  ALT 6* 7* 6*  ALKPHOS 73 71 58  BILITOT 0.5 0.4 0.4  PROT 7.8 7.6 7.0  ALBUMIN 3.0* 3.1* 2.8*   No results for input(s): LIPASE, AMYLASE in the last 168 hours. No results for input(s): AMMONIA in the last 168 hours. CBC:  Recent Labs Lab 08/07/15 0950 08/12/15 2254 08/13/15 0326  WBC 6.9 5.8 5.4  NEUTROABS 4.7 3.3  --   HGB 8.3* 7.9* 7.7*  HCT 27.8* 26.5* 25.7*  MCV 75.1* 76.6* 76.5*  PLT 387 409* 350   Cardiac Enzymes:  Recent Labs Lab 08/07/15 0950 08/12/15 2254 08/13/15 0326  TROPONINI <0.03 <0.03 <0.03    BNP (last 3 results) No results for input(s): BNP in the  last 8760 hours.  ProBNP (last 3 results) No results for input(s): PROBNP in the last 8760 hours.  CBG: No results for input(s): GLUCAP in the last 168 hours.  Radiological Exams on Admission: No results found.  EKG: Independently reviewed.  QTC 477, no ischemic change.  Assessment/Plan Principal Problem:   Left foot infection Active Problems:   Severe episode of recurrent major depressive disorder, without psychotic features (HCC)   Anemia, iron deficiency   Essential hypertension   UTI (urinary tract infection)   Left foot infection in 1st MTP joint in the setting of presence of hardware : CT scan did not show osteomyelitis. Cannot do MRI due to presence of hardware. Patient is not septic on admission. ID was consulted, Dr. Linus Salmons recommended surgical management. ESR 90.  - will admit to tele bed - pt was started with vancomycin and Zosyn per pharmacy by accepting doctor, will continue. - PRN hydroxyzine for nausea, tramadol for pain - Blood cultures x 2  - CRP - wound care consult - INR/PTT/type & screen - CBC and CMP -Consulted  ortho in AM, Dr. Percell Miller will see pt.  Severe episode of recurrent major depressive disorder, without psychotic features ([place]) and suicidal ideation: Admitted to [facility] on 08/07/18. Patient was treated with Remeron and Lexapro with significant improvement. Patient feels much better. No suicidal or homicidal ideations. -Continue Remeron and Lexapro  Anemia, iron deficiency: Hgb 8.3. -continue iron supplement -Follow-up with CBC  Essential hypertension: -Switched lisinopril to amlodipine since her BMP showed mild mild AKi with cre 1.16 on 08/10/15.  -IV hydralazine when necessary -Follow-up renal function may be met  Possible UTI (urinary tract infection): Patient denies symptoms of UTI, but her urinalysis has large amount of leukocyte on 08/07/15.  -On vancomycin and Zosyn  -Follow-up urine culture.    DVT ppx: SCD  Code Status: Full code Family Communication: None at bed side.  Disposition Plan: Admit to inpatient   Date of Service 08/13/2015    Ivor Costa [facility] Pager [phone]  If 7PM-7AM, please contact night-coverage [email or web address] Password [email or web address] 08/13/2015, 5:09 AM

## 2015-08-12 NOTE — [facility] Group Notes (Signed)
[facility] Group Notes:  (Nursing/MHT/Case Management/Adjunct)  Date:  08/12/2015  Time:  4:03 PM Type of Therapy:  Psychoeducational Skills  Participation Level:  Did Not Attend  Participation Quality:  DID NOT ATTEND  Affect:  DID NOT ATTEND  Cognitive:  DID NOT ATTEND  Insight:  None  Engagement in Group:  DID NOT ATTEND  Modes of Intervention:  DID NOT ATTEND  Summary of Progress/Problems: Pt did not attend patient self inventory group.     Bethann Punches 08/12/2015, 4:03 PM

## 2015-08-12 NOTE — Progress Notes (Addendum)
Patient ID: Tara Brown, female   DOB: [birth year], 54 y.o.   MRN: [id number] [facility] MD Progress Note  08/12/2015 5:42 PM Tara Brown  MRN:  [id number] Subjective:  Patient reports mood is improving, feels less depressed, does not endorse medication side effects. Objective : I have discussed case with treatment team and patient seen. Compared to admission, her mood is improved, and affect is fuller in range, she denies any suicidal ideations. At this time her focus is on her foot infection, and on disposition planning . As noted, patient feels she can no longer function independently at home due to the limitations related to her Arthritis . She would be willing to go to an Assisted Living Facility. In the event that she needs chronic IV antibiotics for foot infection CSW reports that she could be referred to a SNF. Patient aware and in agreement . Pain is well controlled on Ultram. Patient visible on unit, behavior in good control.    Principal Problem: Major depressive disorder, recurrent, severe without psychotic features (HCC) Diagnosis:   Patient Active Problem List   Diagnosis Date Noted  . Penetrating foot wound [S91.339A] 08/12/2015  . Osteomyelitis (HCC) [M86.9]   . Hypokalemia [E87.6]   . Anemia, iron deficiency [D50.9]   . Essential hypertension [I10]   . Severe episode of recurrent major depressive disorder, without psychotic features (HCC) [F33.2]   . Major depression (HCC) [F32.9] 03/26/2015  . Psoriatic arthritis (HCC) [L40.50] 03/11/2015  . Osteoporosis [M81.0] 03/11/2015  . Major depressive disorder, recurrent, severe without psychotic features (HCC) [F33.2] 03/11/2015  . Sepsis secondary to UTI (HCC) [A41.9, N39.0] 03/08/2015  . Leukopenia [D72.819] 03/08/2015  . Anemia of chronic disease [D63.8] 03/08/2015   Total Time spent with patient: 20 minutes     Past Medical History:  Past Medical History  Diagnosis Date  . Osteoporosis   . Psoriatic arthritis  (HCC)   . Anemia   . Depression     Past Surgical History  Procedure Laterality Date  . Hip fracture surgery    . Knee surgery    . Foot surgery    . Hand surgery    . Neck surgery     Family History:  Family History  Problem Relation Age of Onset  . Stroke Neg Hx     Social History:  History  Alcohol Use No     History  Drug Use No    Social History   Social History  . Marital Status: Divorced    Spouse Name: N/A  . Number of Children: N/A  . Years of Education: N/A   Social History Main Topics  . Smoking status: Never Smoker   . Smokeless tobacco: Never Used  . Alcohol Use: No  . Drug Use: No  . Sexual Activity: No   Other Topics Concern  . None   Social History Narrative   Additional Social History:   Sleep: improved   Appetite:   Improving   Current Medications: Current Facility-Administered Medications  Medication Dose Route Frequency Provider Last Rate Last Dose  . acetaminophen (TYLENOL) tablet 650 mg  650 mg Oral Q6H PRN Earney Navy, NP   650 mg at 08/10/15 2118  . cephALEXin (KEFLEX) capsule 500 mg  500 mg Oral 3 times per day Earney Navy, NP   500 mg at 08/12/15 1440  . cholecalciferol (VITAMIN D) tablet 400 Units  400 Units Oral Daily Earney Navy, NP   400 Units at 08/12/15 0800  .  doxycycline (VIBRA-TABS) tablet 100 mg  100 mg Oral Q12H Christina P Rama, MD      . ferrous sulfate tablet 325 mg  325 mg Oral Q breakfast Earney Navy, NP   325 mg at 08/12/15 0800  . folic acid (FOLVITE) tablet 1 mg  1 mg Oral Daily Earney Navy, NP   1 mg at 08/12/15 0800  . lisinopril (PRINIVIL,ZESTRIL) tablet 10 mg  10 mg Oral Daily Earney Navy, NP   10 mg at 08/12/15 0800  . mirtazapine (REMERON) tablet 15 mg  15 mg Oral QHS Craige Cotta, MD   15 mg at 08/11/15 2109  . predniSONE (DELTASONE) tablet 10 mg  10 mg Oral Q breakfast Earney Navy, NP   10 mg at 08/12/15 0800  . silver sulfADIAZINE (SILVADENE) 1 %  cream 1 application  1 application Topical Daily Earney Navy, NP   1 application at 08/12/15 0800  . traMADol (ULTRAM) tablet 100 mg  100 mg Oral Q8H PRN Craige Cotta, MD   100 mg at 08/12/15 1441  . zolpidem (AMBIEN) tablet 5 mg  5 mg Oral QHS PRN Earney Navy, NP   5 mg at 08/11/15 2109    Lab Results:  No results found for this or any previous visit (from the past 48 hour(s)).  Physical Findings: AIMS: Facial and Oral Movements Muscles of Facial Expression: None, normal Lips and Perioral Area: None, normal Jaw: None, normal Tongue: None, normal,Extremity Movements Upper (arms, wrists, hands, fingers): None, normal Lower (legs, knees, ankles, toes): None, normal, Trunk Movements Neck, shoulders, hips: None, normal, Overall Severity Severity of abnormal movements (highest score from questions above): None, normal Incapacitation due to abnormal movements: None, normal Patient's awareness of abnormal movements (rate only patient's report): No Awareness, Dental Status Current problems with teeth and/or dentures?: No Does patient usually wear dentures?: No  CIWA:    COWS:     Musculoskeletal: Strength & Muscle Tone: within normal limits- range of motion  Affected by arthritis  Gait & Station: normal Patient leans: N/A  Psychiatric Specialty Exam: ROS chronic joint pain related to chronic arthritis, chronic ulceration with drainage and erythema on L toe, no fever, no chills   Blood pressure 119/84, pulse 98, temperature 97.5 F (36.4 C), temperature source Oral, resp. rate 20, height 5\' 6"  (1.676 m), weight 104 lb (47.174 kg).Body mass index is 16.79 kg/(m^2).  General Appearance: Fairly Groomed  ::  Good  Speech:  Normal Rate  Volume:  Normal  Mood:   Improving and today states she feels better, less depressed   Affect:   More reactive, brighter   Thought Process:  Linear  Orientation:  Other:  fully alert and attentive   Thought Content:  no  hallucinations, no delusions, not internally preoccupied   Suicidal Thoughts:  No denies plan or intention of hurting self or of SI  Homicidal Thoughts:  No  Memory:  recent and remote grossly intact   Judgement:  Other:  improving  Insight:  Present  Psychomotor Activity:  More visible on unit, motor activity difficulted by her severe arthritis   Concentration:  Good  Recall:  good  Fund of Knowledge:Good  Language: Good  Akathisia:  Negative  Handed:  Right  AIMS (if indicated):     Assets:  Communication Skills Desire for Improvement Resilience  ADL's:   Fair   Cognition: WNL  Sleep:  Number of Hours: 6.75  Assessment - presents with  ongoing improvement of mood and affect.  The focus at this time relates to best disposition plan, and this in turn may hinge on best treatment for her foot infection. She is tolerating Remeron well, and it does seem to be helping to improve sleep, appetite. Chronic foot / toe infection , as above  * Of note, have reviewed EKG report with Dr. Darnelle Catalan- patient has no chest pain, no SOB, no diaphoresis, no pallor, no cardiac symptoms, and does not appear to be in any discomfort or distress. Dr. Darnelle Catalan requested repeat EKG for follow up but it is not felt patient is having an acute cardiac event at this time.   Treatment Plan Summary: Daily contact with patient to assess and evaluate symptoms and progress in treatment, Medication management, Plan inpatient admission and medications as below Continue to encourage milieu, group participation to work on coping skills and symptom reduction Continue Remeron 15  mgrs QHS for depression, and to help with insomnia Continue Ultram  100 mgrs Q 8 hours PRNs for chronic pain  I have discussed case with hospitalist and with ID consultant  Regarding ongoing management of toe infection. As reviewed with Hospitalist, Dr. Darnelle Catalan, patient may need transfer to medical unit in order to get appropriate treatment and orthopedic  evaluation.  COBOS, FERNANDO 08/12/2015, 5:42 PM

## 2015-08-12 NOTE — Progress Notes (Signed)
ANTIBIOTIC CONSULT NOTE - INITIAL  Pharmacy Consult for vancomycin/zosyn Indication: osteomyelitis  No Known Allergies  Patient Measurements:   Adjusted Body Weight:   Vital Signs: Temp: 98 F (36.7 C) (12/01 2022) Temp Source: Oral (12/01 2022) BP: 142/80 mmHg (12/01 2022) Pulse Rate: 89 (12/01 2022) Intake/Output from previous day:   Intake/Output from this shift:   Labs:  Recent Labs  08/10/15 0650  CREATININE 1.16*   Estimated Creatinine Clearance: 40.4 mL/min (by C-G formula based on Cr of 1.16). No results for input(s): VANCOTROUGH, VANCOPEAK, VANCORANDOM, GENTTROUGH, GENTPEAK, GENTRANDOM, TOBRATROUGH, TOBRAPEAK, TOBRARND, AMIKACINPEAK, AMIKACINTROU, AMIKACIN in the last 72 hours.   Microbiology: Recent Results (from the past 720 hour(s))  Urine culture     Status: None   Collection Time: 08/07/15 10:20 AM  Result Value Ref Range Status   Specimen Description URINE, RANDOM  Final   Special Requests NONE  Final   Culture   Final    MULTIPLE SPECIES PRESENT, SUGGEST RECOLLECTION Performed at [facility]    Report Status 08/09/2015 FINAL  Final    Medical History: Past Medical History  Diagnosis Date  . Osteoporosis   . Psoriatic arthritis (HCC)   . Anemia   . Depression    Assessment: 59 YOF transferred from [facility], has osteomyelitis of R toe with hardware. Seen by ID who recommended continue cephalexin and consult orthopedic surgery for hardware removal and debridement.  July culture was MSSA at [facility].   Upon transfer to [facility], antibiotics were changed to vancomycin/zosyn.  12/1 >>vancomycin  >> 12/1 >> pip/tazo >>    No cultures  Renal: SCr slightly elevated 11/29, normalized CrCl = 23ml/min WBC WNL No fevers  Dose changes/levels:  Goal of Therapy:  Vancomycin trough level 15-20 mcg/ml  Plan:   Vancomycin 1gm x 1 then 500mg  IV q12h  Check vancomycin trough  Zosyn 3.375gm IV q8h over 4h infusion  , PharmD, BCPS.    Pager: Juliette Alcide 08/12/2015 8:57 PM

## 2015-08-12 NOTE — Progress Notes (Signed)
Met with pt at beginning of shift.  She denies SI/HI and verbally contracts for safety.  She denies needs and concerns prior to transfer.  She was aware that she would be transferring to [facility].  Report given to RN on 5E at [facility].  Belongings returned to pt from locker 28.  Pt transferred to [place] with MHT via [place].

## 2015-08-12 NOTE — Progress Notes (Signed)
DAR NOTE: Patient presents with bright affect and jovial  mood.  Denies pain, auditory and visual hallucinations.  Rates depression at 5, hopelessness at 5, and anxiety at 2.  Maintained on routine safety checks.  Medications given as prescribed.  Support and encouragement offered as needed. Did not  Attended the group.  States goal for today is "talk to the Child psychotherapist."  Patient observed socializing with peers in the dayroom. Pt's safety ensured with 15 minute and environmental checks. Pt currently denies SI/HI and A/V hallucinations. Pt verbally agrees to seek staff if SI/HI or A/VH occurs and to consult with staff before acting on these thoughts. Will continue POC.

## 2015-08-12 NOTE — Evaluation (Signed)
Physical Therapy Evaluation Patient Details Name: Tara Brown MRN: [id number] DOB: 08-15-1961 Today's Date: 08/12/2015   History of Present Illness  54 year old divorced female. Patient reports worsening depression in the context of increased difficulties in daily functioning and being able to live independently, due to ongoing progression of her chronic /debilitating psoriatic arthritis  Clinical Impression  Patient evaluated by Physical Therapy with no further acute PT needs identified. All education has been completed and the patient has no further questions.  See below for any follow-up Physical Therapy or equipment needs. PT is signing off. Thank you for this referral. Will messenger over foam grips for pt to use with utensils, toothbrush etc;     Follow Up Recommendations  (will send over foam grips for pt utensils as needed)    Equipment Recommendations  None recommended by PT    Recommendations for Other Services       Precautions / Restrictions        Mobility  Bed Mobility Overal bed mobility: Independent                Transfers Overall transfer level: Independent                  Ambulation/Gait Ambulation/Gait assistance: Independent Ambulation Distance (Feet): 250 Feet            Stairs            Wheelchair Mobility    Modified Rankin (Stroke Patients Only)       Balance     Sitting balance-Leahy Scale: Normal Sitting balance - Comments: pt dons socks I'ly, reaches to floor from sitting without difficulty     Standing balance-Leahy Scale: Good               High level balance activites: Side stepping;Backward walking;Direction changes;Turns High Level Balance Comments: no LOB with above; pt does state some days she feels that she needs to use a cane, which she has at home             Pertinent Vitals/Pain Pain Assessment: No/denies pain    Home Living Family/patient expects to be discharged to::  Private residence Living Arrangements: Alone   Type of Home: Apartment Home Access: Level entry     Home Layout: One level Home Equipment: Cane - single point;Bedside commode      Prior Function           Comments: has difficulty with stove, has made many compensations on her own as well as having had PT/OT in past      Hand Dominance        Extremity/Trunk Assessment   Upper Extremity Assessment: RUE deficits/detail;LUE deficits/detail RUE Deficits / Details: multiple flexion deformities bil fingers/hands/wrists; shoulder flexion to ~ 70* bil with a great deal of crepitus     LUE Deficits / Details: see above   Lower Extremity Assessment: Generalized weakness (diffuse atrophy but AROM WFL; grossly at least 3+/5)         Communication      Cognition Arousal/Alertness: Awake/alert Behavior During Therapy: WFL for tasks assessed/performed Overall Cognitive Status: Within Functional Limits for tasks assessed                      General Comments      Exercises        Assessment/Plan    PT Assessment Patent does not need any further PT services  PT Diagnosis Difficulty walking  PT Problem List    PT Treatment Interventions     PT Goals (Current goals can be found in the Care Plan section) Acute Rehab PT Goals Patient Stated Goal: to do as much as possible for herself PT Goal Formulation: All assessment and education complete, DC therapy    Frequency     Barriers to discharge        Co-evaluation               End of Session   Activity Tolerance: Patient tolerated treatment well Patient left: in bed      Functional Assessment Tool Used: clinical judgement Functional Limitation: Mobility: Walking and moving around Mobility: Walking and Moving Around Current Status (F0932): 0 percent impaired, limited or restricted Mobility: Walking and Moving Around Goal Status [phone]): 0 percent impaired, limited or restricted Mobility:  Walking and Moving Around Discharge Status [phone]): 0 percent impaired, limited or restricted    Time: 4270-6237 PT Time Calculation (min) (ACUTE ONLY): 21 min   Charges:   PT Evaluation $Initial PT Evaluation Tier I: 1 Procedure     PT G Codes:   PT G-Codes **NOT FOR INPATIENT CLASS** Functional Assessment Tool Used: clinical judgement Functional Limitation: Mobility: Walking and moving around Mobility: Walking and Moving Around Current Status (S2831): 0 percent impaired, limited or restricted Mobility: Walking and Moving Around Goal Status (D1761): 0 percent impaired, limited or restricted Mobility: Walking and Moving Around Discharge Status (Y0737): 0 percent impaired, limited or restricted    [id number] 08/12/2015, 10:13 AM

## 2015-08-12 NOTE — [facility] Group Notes (Signed)
[facility] Group Therapy 08/12/2015 1:15pm  Type of Therapy: [facility] Presentation  Pt did not attend, declined invitation.   Chad Cordial, LCSWA 08/12/2015 1:31 PM

## 2015-08-12 NOTE — Consult Note (Addendum)
[facility]       Reason for Consult: right MTP synovitis    Referring Physician: Dr. Jama Flavors  Principal Problem:   Major depressive disorder, recurrent, severe without psychotic features (HCC) Active Problems:   Osteomyelitis (HCC)   Hypokalemia   Anemia, iron deficiency   Essential hypertension   . cephALEXin  500 mg Oral 3 times per day  . cholecalciferol  400 Units Oral Daily  . doxycycline  100 mg Oral Q12H  . ferrous sulfate  325 mg Oral Q breakfast  . folic acid  1 mg Oral Daily  . lisinopril  10 mg Oral Daily  . mirtazapine  15 mg Oral QHS  . predniSONE  10 mg Oral Q breakfast  . silver sulfADIAZINE  1 application Topical Daily    Recommendations: She needs surgical management with her podiatrist or an orthopedic surgeon, likely needs hardware removed, debridement She can continue on Keflex for 7 more days pending evaluation by surgeon  Routine HIV and HCV testing  Assessment: She has pus and synovitis of joint without osteomyelitis noted on CT.  Needs surgergical approach for healing, more antibiotics will not cure this, particularly with hardware in joint.    No dysuria and UA c/w contaminated sample.  No indication to treat.  Also noted a history in Care Everywhere of psoriatic arthritis so this may be non-infectious.   Antibiotics: keflex  HPI: Tara Brown is a 54 y.o. female with HTN, depression and hardware in toe done 30 years ago who is found in [facility] due to depression and noted to have an erythematous right MTP, some warmth and a small opening anterior and posterior.  This has been an ongoing problem since July and is followed by Dr. Tresa Endo in [facility]. No fever, no chills.  She was seen by IM and CT was done and not c/w osteomyelitis.  She has been on multiple courses of antibiotics with no improvement.  She was seen in [facility] in July and culture swab done with MSSA.  Some pain but able to walk on it.   CT  independently reviewed, no pus noted.   No dysuria  Review of Systems:  Constitutional: negative for fatigue and malaise Gastrointestinal: negative for diarrhea Musculoskeletal: positive for arthralgias All other systems reviewed and are negative   Past Medical History  Diagnosis Date  . Osteoporosis   . Psoriatic arthritis (HCC)   . Anemia   . Depression     Social History  Substance Use Topics  . Smoking status: Never Smoker   . Smokeless tobacco: Never Used  . Alcohol Use: No    Family History  Problem Relation Age of Onset  . Stroke Neg Hx     No Known Allergies  Physical Exam: Constitutional: in no apparent distress and alert  Filed Vitals:   08/12/15 1200 08/12/15 1201  BP: 135/86 119/84  Pulse: 88 98  Temp: 97.5 F (36.4 C)   Resp: 20    EYES: anicteric Cardiovascular: Cor RRR Respiratory: clear; normal respiratory effort GI: Bowel sounds are normal, liver is not enlarged, spleen is not enlarged Musculoskeletal: right MTP with deformity, erythema, minimal warmth, two area open with minimal drainage.  Minimal tenderness Skin: negatives: no rash Hematologic: no cervical lad  Lab Results  Component Value Date   WBC 6.9 08/07/2015   HGB 8.3* 08/07/2015   HCT 27.8* 08/07/2015   MCV 75.1* 08/07/2015   PLT 387 08/07/2015  Lab Results  Component Value Date   CREATININE 1.16* 08/10/2015   BUN 14 08/10/2015   NA 142 08/10/2015   K 4.6 08/10/2015   CL 106 08/10/2015   CO2 27 08/10/2015    Lab Results  Component Value Date   ALT 6* 08/07/2015   AST 16 08/07/2015   ALKPHOS 73 08/07/2015     Microbiology: Recent Results (from the past 240 hour(s))  Urine culture     Status: None   Collection Time: 08/07/15 10:20 AM  Result Value Ref Range Status   Specimen Description URINE, RANDOM  Final   Special Requests NONE  Final   Culture   Final    MULTIPLE SPECIES PRESENT, SUGGEST RECOLLECTION Performed at [facility]    Report Status  08/09/2015 FINAL  Final    Staci Righter, MD [email or web address] [phone] pager  [phone] cell 08/12/2015, 4:21 PM

## 2015-08-13 ENCOUNTER — Encounter (HOSPITAL_COMMUNITY): Payer: Self-pay | Admitting: *Deleted

## 2015-08-13 DIAGNOSIS — N183 Chronic kidney disease, stage 3 unspecified: Secondary | ICD-10-CM | POA: Diagnosis present

## 2015-08-13 DIAGNOSIS — R8299 Other abnormal findings in urine: Secondary | ICD-10-CM

## 2015-08-13 DIAGNOSIS — F332 Major depressive disorder, recurrent severe without psychotic features: Secondary | ICD-10-CM

## 2015-08-13 DIAGNOSIS — M86172 Other acute osteomyelitis, left ankle and foot: Secondary | ICD-10-CM

## 2015-08-13 DIAGNOSIS — M659 Synovitis and tenosynovitis, unspecified: Secondary | ICD-10-CM

## 2015-08-13 DIAGNOSIS — L4050 Arthropathic psoriasis, unspecified: Principal | ICD-10-CM

## 2015-08-13 DIAGNOSIS — D509 Iron deficiency anemia, unspecified: Secondary | ICD-10-CM

## 2015-08-13 DIAGNOSIS — L089 Local infection of the skin and subcutaneous tissue, unspecified: Secondary | ICD-10-CM

## 2015-08-13 DIAGNOSIS — I10 Essential (primary) hypertension: Secondary | ICD-10-CM

## 2015-08-13 LAB — CBC
HCT: 25.7 % — ABNORMAL LOW (ref 36.0–46.0)
HEMOGLOBIN: 7.7 g/dL — AB (ref 12.0–15.0)
MCH: 22.9 pg — AB (ref 26.0–34.0)
MCHC: 30 g/dL (ref 30.0–36.0)
MCV: 76.5 fL — ABNORMAL LOW (ref 78.0–100.0)
PLATELETS: 350 10*3/uL (ref 150–400)
RBC: 3.36 MIL/uL — AB (ref 3.87–5.11)
RDW: 17.8 % — ABNORMAL HIGH (ref 11.5–15.5)
WBC: 5.4 10*3/uL (ref 4.0–10.5)

## 2015-08-13 LAB — COMPREHENSIVE METABOLIC PANEL
ALK PHOS: 58 U/L (ref 38–126)
ALT: 6 U/L — AB (ref 14–54)
ANION GAP: 8 (ref 5–15)
AST: 12 U/L — ABNORMAL LOW (ref 15–41)
Albumin: 2.8 g/dL — ABNORMAL LOW (ref 3.5–5.0)
BILIRUBIN TOTAL: 0.4 mg/dL (ref 0.3–1.2)
BUN: 23 mg/dL — ABNORMAL HIGH (ref 6–20)
CALCIUM: 8.4 mg/dL — AB (ref 8.9–10.3)
CO2: 25 mmol/L (ref 22–32)
CREATININE: 1.11 mg/dL — AB (ref 0.44–1.00)
Chloride: 107 mmol/L (ref 101–111)
GFR, EST NON AFRICAN AMERICAN: 54 mL/min — AB (ref >60)
Glucose, Bld: 99 mg/dL (ref 65–99)
Potassium: 3.7 mmol/L (ref 3.5–5.1)
SODIUM: 140 mmol/L (ref 135–145)
TOTAL PROTEIN: 7 g/dL (ref 6.5–8.1)

## 2015-08-13 LAB — GLUCOSE, CAPILLARY: GLUCOSE-CAPILLARY: 80 mg/dL (ref 65–99)

## 2015-08-13 LAB — ABO/RH: ABO/RH(D): O POS

## 2015-08-13 LAB — TROPONIN I: Troponin I: <0.03 ng/mL (ref <0.031)

## 2015-08-13 LAB — C-REACTIVE PROTEIN: CRP: 5.8 mg/dL — ABNORMAL HIGH (ref <1.0)

## 2015-08-13 MED ORDER — MIRTAZAPINE 15 MG PO TBDP: 15.0000 mg | ORAL_TABLET | Freq: Every day | ORAL | Status: DC

## 2015-08-13 MED ORDER — ESCITALOPRAM OXALATE 20 MG PO TABS: 20.0000 mg | ORAL_TABLET | Freq: Every day | ORAL | Status: DC

## 2015-08-13 MED ORDER — ZOLPIDEM TARTRATE 10 MG PO TABS: 10.0000 mg | ORAL_TABLET | Freq: Every evening | ORAL | Status: DC | PRN

## 2015-08-13 NOTE — Care Management Note (Signed)
Case Management Note  Patient Details  Name: Puneet Selden MRN: [id number] Date of Birth: [birth year]  Subjective/Objective:          osteomyelitis of the left foot          Action/Plan:Date: August 13, 2015 Chart reviewed for concurrent status and case management needs. Will continue to follow patient for changes and needs: Marcelle Smiling, RN, BSN, [place]   [phone]  Expected Discharge Date:                  [facility]  In-House Referral:  Clinical Social Work  Discharge planning Services  CM Consult  Post Acute Care Choice:  NA Choice offered to:  NA  DME Arranged:  N/A DME Agency:  NA  HH Arranged:  NA HH Agency:  NA  Status of Service:  In process, will continue to follow  Medicare Important Message Given:    Date Medicare IM Given:    Medicare IM give by:    Date Additional Medicare IM Given:    Additional Medicare Important Message give by:     If discussed at Long Length of Stay Meetings, dates discussed:    Additional Comments:  Golda Acre, RN 08/13/2015, 10:47 AM

## 2015-08-13 NOTE — Progress Notes (Signed)
Jolayne Rohleder to be D/C'd Home per MD order.  Discussed prescriptions and follow up appointments with the patient. Prescriptions given to patient, medication list explained in detail. Pt verbalized understanding.    Medication List    TAKE these medications        escitalopram 20 MG tablet  Commonly known as:  LEXAPRO  Take 1 tablet (20 mg total) by mouth daily.     ferrous sulfate 325 (65 FE) MG tablet  Take 325 mg by mouth daily with breakfast.     folic acid 1 MG tablet  Commonly known as:  FOLVITE  Take 1 mg by mouth daily.     lisinopril 40 MG tablet  Commonly known as:  PRINIVIL,ZESTRIL  Take 40 mg by mouth daily.     mirtazapine 15 MG disintegrating tablet  Commonly known as:  REMERON SOL-TAB  Take 1 tablet (15 mg total) by mouth at bedtime.     predniSONE 10 MG tablet  Commonly known as:  DELTASONE  Take 10 mg by mouth daily with breakfast.     SSD 1 % cream  Generic drug:  silver sulfADIAZINE  Apply 1 application topically daily.     traMADol 50 MG tablet  Commonly known as:  ULTRAM  Take 100 mg by mouth every 6 (six) hours as needed for moderate pain.     VITAMIN D PO  Take 1 capsule by mouth daily.     zolpidem 10 MG tablet  Commonly known as:  AMBIEN  Take 1 tablet (10 mg total) by mouth at bedtime as needed for sleep.        Filed Vitals:   08/13/15 0507 08/13/15 1530  BP: 140/75 128/62  Pulse: 62 60  Temp: 98 F (36.7 C) 98.6 F (37 C)  Resp: 17 18    Skin clean, dry and intact without evidence of skin break down, no evidence of skin tears noted. IV catheter discontinued intact. Site without signs and symptoms of complications. Dressing and pressure applied. Pt denies pain at this time. No complaints noted.  An After Visit Summary was printed and given to the patient. Patient escorted via WC, and D/C home via private auto.  Rondel Jumbo 08/13/2015 7:19 PM

## 2015-08-13 NOTE — Progress Notes (Signed)
CSW met with the Pt at the bedside to discuss d/c plans.   Pt has difficulty with transportation. CSW spoke with the Pt about the importance of Pt returning to her opt appt with Dr. Sharol Given on Monday at 2:15 pm. Pt voiced understanding of appt time and the need for contacting family to assist with transportation to the appt.   Pt explained "she plans on coming to the appt if she can get a ride."   CSW relayed all information to the covering PA and MD. Pt's sister will be picking the Pt up at the time of d/c.   No further needs at this time.   Pete Pelt [facility]  [phone]

## 2015-08-13 NOTE — Consult Note (Signed)
ORTHOPAEDIC CONSULTATION  REQUESTING PHYSICIAN: Maryruth Bun Rama, MD  Chief Complaint: L great toe chronic infection  HPI: Tara Brown is a 54 y.o. female who complains of pain in the L great toe from a chronic infection.  Per the patient she had hardware placed in the toe years ago by a podiatrist.  In July she started developing an ulcer on the plantar aspect of the toe.  The podiatrist has treated her with a number of antibiotics, but the ulcer continues to drain. She has also developed a second ulcer on the the toe in the past month.  Culture of the wound has come back positive for MSSA. Vanc and zosyn have been started this admission.  CT scan does not suggest osteomyelitis.  ID was consulted and they do not feel there is any indication for further indication for abx.    The patient has a hx of psoriatic arthritis that severely limits her mobility.  She has been able to continue to ambulate, but has considerable pain due to the toe infection.  Given her limited ROM and use of her extremities, she is unable to safely drive a car.  Her only family is in [place], and is limited in ability to provide assistance.  The patient has stated that she will not be able to follow up with doctors as outpatient or get transportation to outpatient surgery.  She "needs to have her toe handled while hospitalize".   Past Medical History  Diagnosis Date  . Osteoporosis   . Psoriatic arthritis (HCC)   . Anemia   . Depression   . Major depressive disorder, recurrent, severe without psychotic features (HCC) 03/11/2015   Past Surgical History  Procedure Laterality Date  . Hip fracture surgery    . Knee surgery    . Foot surgery    . Hand surgery    . Neck surgery     Social History   Social History  . Marital Status: Divorced    Spouse Name: N/A  . Number of Children: N/A  . Years of Education: N/A   Social History Main Topics  . Smoking status: Never Smoker   . Smokeless tobacco:  Never Used  . Alcohol Use: No  . Drug Use: No  . Sexual Activity: No   Other Topics Concern  . None   Social History Narrative   Family History  Problem Relation Age of Onset  . Stroke Neg Hx    No Known Allergies Prior to Admission medications   Medication Sig Start Date End Date Taking? Authorizing Provider  Cholecalciferol (VITAMIN D PO) Take 1 capsule by mouth daily.    Historical Provider, MD  ferrous sulfate 325 (65 FE) MG tablet Take 325 mg by mouth daily with breakfast.    Historical Provider, MD  folic acid (FOLVITE) 1 MG tablet Take 1 mg by mouth daily.    Historical Provider, MD  lisinopril (PRINIVIL,ZESTRIL) 40 MG tablet Take 40 mg by mouth daily. 02/03/15   Historical Provider, MD  predniSONE (DELTASONE) 10 MG tablet Take 10 mg by mouth daily with breakfast.    Historical Provider, MD  SSD 1 % cream Apply 1 application topically daily. 05/25/15   Historical Provider, MD  traMADol (ULTRAM) 50 MG tablet Take 100 mg by mouth every 6 (six) hours as needed for moderate pain.     Historical Provider, MD  zolpidem (AMBIEN) 10 MG tablet Take 10 mg by mouth at bedtime as needed for sleep.  Historical Provider, MD   No results found.  Positive ROS: All other systems have been reviewed and were otherwise negative with the exception of those mentioned in the HPI and as above.  Labs cbc  Recent Labs  08/12/15 2254 08/13/15 0326  WBC 5.8 5.4  HGB 7.9* 7.7*  HCT 26.5* 25.7*  PLT 409* 350    Labs inflam  Recent Labs  08/12/15 1835 08/12/15 2243  CRP 5.7* 5.8*    Labs coag  Recent Labs  08/12/15 2243  INR 1.06     Recent Labs  08/12/15 2254 08/13/15 0326  NA 138 140  K 3.7 3.7  CL 104 107  CO2 29 25  GLUCOSE 109* 99  BUN 25* 23*  CREATININE 1.24* 1.11*  CALCIUM 8.6* 8.4*    Physical Exam: Filed Vitals:   08/12/15 2022 08/13/15 0507  BP: 142/80 140/75  Pulse: 89 62  Temp: 98 F (36.7 C) 98 F (36.7 C)  Resp: 20 17   General: Alert, no  acute distress Cardiovascular: No pedal edema Respiratory: No cyanosis, no use of accessory musculature GI: No organomegaly, abdomen is soft and non-tender Skin: No lesions in the area of chief complaint other than those listed below in MSK exam.  Neurologic: Sensation intact distally Psychiatric: Patient is competent for consent with normal mood and affect Lymphatic: No axillary or cervical lymphadenopathy  MUSCULOSKELETAL:  Patient is resting comfortably in the bed.  She has limited ROM and use of her upper extremities due to the psoriatic arthritis.  Left great toe is swollen and erythematous.  Warmth to the touch.  Two actively draining ulcers of the toe that can express purulent fluid.  Pain with ROM.Sensation intact. Palpable pulses.   Other extremities are atraumatic with painless ROM and NVI.  Assessment: L great toe chronic ulcer that has been draining since July with a hx of hardware placement >10years ago.  Condition is complicated by psoriatic arthritis that limits ROM of her joints.   Plan: Discussed condition with the patient at length.  Patient likely has infection of the great toe that has failed to respond to oral abx.  She will likely require hardware removal with possible fusion of the toe.  No indication for urgent surgical intervention, as the patient is not septic.  Have discussed this case with Dr. Lajoyce Corners, and he is agreeable to seeing the patient for evaluation as outpatient early next week. The patient has stated that she has limited access to transportation, so she needs to have this handled while she is admitted.   After speaking with the medicine team, they agree that there is no indication to hold discharge.  I have arranged for the patient to be seen by Dr. Lajoyce Corners at 2:15pm on 08/16/15.  She acknowledges the importance of this appointment and states she will make arrangements to be seen.  She can continue to be WBAT in the L foot. No abx at discharge per ID recommendation.     Ishmael Holter Cell [phone]   08/13/2015 3:40 PM

## 2015-08-13 NOTE — Progress Notes (Signed)
OT Cancellation Note  Patient Details Name: Lynnda Wiersma MRN: [id number] DOB: 06-May-1961   Cancelled Treatment:    Reason Eval/Treat Not Completed: Other (comment).  Ortho was consulted/possible sx.    Will check back.  Claudina Oliphant 08/13/2015, 7:57 AM  Marica Otter, OTR/L [phone] 08/13/2015

## 2015-08-13 NOTE — Progress Notes (Signed)
PT Cancellation Note  Patient Details Name: Tara Brown MRN: [id number] DOB: Jun 18, 1961   Cancelled Treatment:    Reason Eval/Treat Not Completed: Other (comment) (ortho consulted, possible surgery, will follow. )   Tamala Ser 08/13/2015, 8:36 AM [phone]

## 2015-08-13 NOTE — Discharge Summary (Signed)
Physician Discharge Summary  Tara Brown DBZ:[id number] DOB: 05-Dec-1960 DOA: 08/12/2015  PCP: Hermelinda Medicus, MD  Admit date: 08/12/2015 Discharge date: 08/13/2015   Recommendations for Outpatient Follow-Up:   1. The patient will follow-up with Dr. Sharol Given for her non-healing left foot wound. 2. PCP: Please follow-up on final blood culture results.   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    Discharge Condition: Stable.  Diet recommendation: Regular.  Wound care: Keep wounds clean and dry.   History of Present Illness:   Tara Brown is an 54 y.o. female with PMH of depression, hypertension, osteoporosis, s/p of arthroplasty of the first metatarsal phalangeal joint, chronic left foot infection in 1st MTP, who was admitted from [facility] with concerns for osteomyelitis. Patient reports that she had hardware placed to left first MTP joint 30 years ago. She developed infection in that joint in July. She has been followed by Dr. Claiborne Billings (podiatrist) in [facility]. She was treated with 4 antibiotics without improvement. She still has redness, tenderness and warmth in left 1st MTP. She was seen in [facility] in July and culture swab showed MSSA. She has small openings in anterior and posterior of left 1st MTP with purulent discharge. She had CT-left foot which did not show osteomyelitis.ID was consulted. Dr. Linus Salmons thought that more antibiotics will not cure this, particularly with hardware in that foot.   Hospital Course by Problem:   Principal problem:  Left foot infection in 1st MTP joint in the setting of  presence of hardware worrisome for osteomyelitis - Although CT scan did not show osteomyelitis, persistent infection in the setting of hardware suspicious for early osteo. - Cannot do MRI due to presence of hardware. - Dr. Linus Salmons of ID evaluated the patient 08/12/15, recommended surgical management. ESR 90. CRP pending. - Initially placed on vancomycin/Zosyn but ID feels no further antibiotics needed. - F/U Blood cultures x 2.  - Evaluated by Dr. Percell Miller who will arrange outpatient follow up with Dr. Sharol Given.  Active problems:  Psoriatic arthritis - On chronic prednisone.  Stage III chronic kidney disease - Creatinine stable.  Severe episode of recurrent major depressive disorder, without psychotic features ([place]) and suicidal ideation - Admitted to [facility] on 08/07/18. Patient was treated with Remeron and Lexapro with significant improvement.  - Patient feels much better. No suicidal or homicidal ideations. - Continue Remeron and Lexapro.  Anemia, iron deficiency - Hgb 7.7. Continue iron supplementation.  Essential hypertension: - Resume lisinopril at discharge.    Medical Consultants:    Dr. Linus Salmons, ID  Dr. Percell Miller, Orthopedic Surgery   Discharge Exam:   Filed Vitals:   08/13/15 0507 08/13/15 1530  BP: 140/75 128/62  Pulse: 62 60  Temp: 98 F (36.7 C) 98.6 F (37 C)  Resp: 17 18   Filed Vitals:   08/12/15 2022 08/13/15 0507 08/13/15 1530  BP: 142/80 140/75 128/62  Pulse: 89 62 60  Temp: 98 F (36.7 C) 98 F (36.7 C) 98.6 F (37 C)  TempSrc: Oral Oral Oral  Resp: 20 17 18   SpO2: 100% 98% 100%    Gen:  NAD Cardiovascular:  RRR,  No M/R/G Respiratory: Lungs CTAB Gastrointestinal: Abdomen soft, NT/ND with normal active bowel sounds. Extremities: Foot wound left MTP as pictured below:       The results of significant diagnostics from this hospitalization (including imaging, microbiology, ancillary and laboratory) are listed below for reference.      Procedures and Diagnostic Studies:   No results found.   Labs:   Basic Metabolic Panel:  Recent Labs Lab 08/07/15 0950 08/10/15 0650 08/12/15 2254 08/13/15 0326  NA 135 142 138 140  K 3.1* 4.6 3.7 3.7  CL 101 106 104 107  CO2 23 27 29 25   GLUCOSE 192* 90 109* 99  BUN 10 14 25* 23*  CREATININE 0.96 1.16* 1.24* 1.11*  CALCIUM 9.0 10.0 8.6* 8.4*   GFR Estimated Creatinine Clearance: 42.2 mL/min (by C-G formula based on Cr of 1.11). Liver Function Tests:  Recent Labs Lab 08/07/15 0950 08/12/15 2254 08/13/15 0326  AST 16 12* 12*  ALT 6* 7* 6*  ALKPHOS 73 71 58  BILITOT 0.5 0.4 0.4  PROT 7.8 7.6 7.0  ALBUMIN 3.0* 3.1* 2.8*   No results for input(s): LIPASE, AMYLASE in the last 168 hours. No results for input(s): AMMONIA in the last 168 hours. Coagulation profile  Recent Labs Lab 08/12/15 2243  INR 1.06    CBC:  Recent Labs Lab 08/07/15 0950 08/12/15 2254 08/13/15 0326  WBC 6.9 5.8 5.4  NEUTROABS 4.7 3.3  --   HGB 8.3* 7.9* 7.7*  HCT 27.8* 26.5* 25.7*  MCV 75.1* 76.6* 76.5*  PLT 387 409* 350   Cardiac Enzymes:  Recent Labs Lab 08/07/15 0950 08/12/15 2254 08/13/15 0326 08/13/15 0936  TROPONINI <0.03 <0.03 <0.03 <0.03   BNP: Invalid input(s): POCBNP CBG:  Recent Labs Lab 08/13/15 0743  GLUCAP 80   Microbiology Recent Results (from the past 240 hour(s))  Urine culture     Status: None   Collection Time: 08/07/15 10:20 AM  Result Value Ref Range Status   Specimen Description URINE, RANDOM  Final   Special Requests NONE  Final   Culture   Final    MULTIPLE SPECIES PRESENT, SUGGEST RECOLLECTION Performed at [facility]    Report Status 08/09/2015 FINAL  Final  Culture, blood (routine x 2)     Status: None (Preliminary result)   Collection Time: 08/12/15 10:43 PM  Result Value Ref Range Status   Specimen Description BLOOD RIGHT ARM  Final   Special Requests   Final    BOTTLES DRAWN AEROBIC ONLY 5CC Performed at [facility]    Culture PENDING  Incomplete   Report Status PENDING  Incomplete  Culture, blood (routine x 2)     Status: None (Preliminary result)   Collection Time: 08/12/15 10:54 PM  Result Value Ref Range Status   Specimen Description BLOOD RIGHT HAND  Final   Special Requests   Final    BOTTLES DRAWN AEROBIC AND ANAEROBIC 5CC Performed at [facility]    Culture PENDING  Incomplete   Report Status PENDING  Incomplete     Discharge Instructions:   Discharge Instructions    Call MD for:  temperature >100.4    Complete by:  As directed      Diet general    Complete by:  As directed      Discharge wound care:    Complete by:  As directed   Cover wounds with clean, dry dressings.     Increase activity slowly    Complete  by:  As directed             Medication List    TAKE these medications        escitalopram 20 MG tablet  Commonly known as:  LEXAPRO  Take 1 tablet (20 mg total) by mouth daily.     ferrous sulfate 325 (65 FE) MG tablet  Take 325 mg by mouth daily with breakfast.     folic acid 1 MG tablet  Commonly known as:  FOLVITE  Take 1 mg by mouth daily.     lisinopril 40 MG tablet  Commonly known as:  PRINIVIL,ZESTRIL  Take 40 mg by mouth daily.     mirtazapine 15 MG disintegrating tablet  Commonly known as:  REMERON SOL-TAB  Take 1 tablet (15 mg total) by mouth at bedtime.     predniSONE 10 MG tablet  Commonly known as:  DELTASONE  Take 10 mg by mouth daily with breakfast.     SSD 1 % cream  Generic drug:  silver sulfADIAZINE  Apply 1 application topically daily.     traMADol 50 MG tablet  Commonly known as:  ULTRAM  Take 100 mg by mouth every 6 (six) hours as needed for moderate pain.     VITAMIN D PO  Take 1 capsule by mouth daily.     zolpidem 10 MG tablet  Commonly known as:  AMBIEN  Take 1 tablet (10 mg total) by mouth at bedtime as needed for sleep.           Follow-up Information    Follow up with Newt Minion,  MD.   Specialty:  Orthopedic Surgery   Contact information:   [place] [address] [phone]        Time coordinating discharge: 35 minutes.  Signed:  Nikitia Asbill  Pager [phone] [facility] 08/13/2015, 4:08 PM

## 2015-08-13 NOTE — Progress Notes (Signed)
Progress Note   Tara Brown KNL:[id number] DOB: 03-17-1961 DOA: 08/12/2015 PCP: Hermelinda Medicus, MD   Brief Narrative:   Tara Brown is an 54 y.o. female with PMH of depression, hypertension, osteoporosis, s/p of arthroplasty of the first metatarsal phalangeal joint, chronic left foot infection in 1st MTP, who was admitted from [facility] with concerns for osteomyelitis. Patient reports that she had hardware placed to left first MTP joint 30 years ago. She developed infection in that joint in July. She has been followed by Dr. Claiborne Billings (podiatrist) in [facility]. She was treated with 4 antibiotics without improvement. She still has redness, tenderness and warmth in left 1st MTP. She was seen in [facility] in July and culture swab showed MSSA. She has small openings in anterior and posterior of left 1st MTP with purulent discharge. She had CT-left foot which did not show osteomyelitis.ID was consulted. Dr. Linus Salmons thought that more antibiotics will not cure this, particularly with hardware in that foot.  Assessment/Plan:   Principal problem:  Left foot infection in 1st MTP joint in the setting of presence of hardware worrisome for osteomyelitis - Although CT scan did not show osteomyelitis, persistent infection in the setting of hardware suspicious for early osteo. - Cannot do MRI due to presence of hardware. - Dr. Linus Salmons of ID evaluated the patient 08/12/15, recommended surgical management. ESR 90. CRP pending. - Continue vancomycin and Zosyn per pharmacy for now. - F/U Blood cultures x 2.  - Wound care consult requested. - Consulted ortho, Dr. Percell Miller will see pt.  Active problems:  Psoriatic arthritis - On chronic prednisone.  Stage III chronic kidney disease - Monitor creatinine periodically while on vancomycin.  Severe episode of recurrent major depressive disorder, without psychotic features ([place]) and suicidal ideation - Admitted to [facility] on 08/07/18. Patient was treated  with Remeron and Lexapro with significant improvement.  - Patient feels much better. No suicidal or homicidal ideations. - Continue Remeron and Lexapro.  Anemia, iron deficiency - Hgb 7.7. Continue iron supplementation.  Essential hypertension: - Switched lisinopril to amlodipine since her BMP showed mild mild AKI with cre 1.16 on 08/10/15.  - Continue IV hydralazine when necessary. - Creatinine trending down.  DVT prophylaxis - SCDs ordered.   Family Communication/Anticipated D/C date and plan/Code Status   Family Communication: No family at the bedside. Disposition Plan: Home when stable. Anticipated D/C date:   Depends on whether or not orthopedic surgery plans to operate now or at a later date. Code Status: Full code.   IV Access:    Peripheral IV   Procedures and diagnostic studies:   Ct Foot Left W Contrast  08/11/2015  CLINICAL DATA:  Left great toe open sores and erythema for 1 week. Possible hardware rejection. Evaluate for osteomyelitis. Initial encounter. EXAM: CT OF THE LEFT FOOT WITH CONTRAST TECHNIQUE: Multidetector CT imaging was performed following the standard protocol during bolus administration of intravenous contrast. CONTRAST:  170m OMNIPAQUE IOHEXOL 300 MG/ML  SOLN COMPARISON:  None. FINDINGS: The entire foot and ankle were imaged. The bones are diffusely demineralized. There are arthropathic change throughout the forefoot status post total arthroplasty of the first metatarsal phalangeal joint. There is some bone resorption surrounding the prosthetic components within the first metatarsal head and proximal phalangeal base. In addition, there is dorsal and lateral dislocation at the first MTP joint. The interphalangeal joint of the great toe may be partially ankylosed. Advanced arthropathic changes are present at the additional metatarsal phalangeal heads with flattening.  There has been possible previous resection or erosion of the second metatarsal head.  There is also dorsal lateral dislocation at the second MTP joint. Milder arthropathic changes are present throughout the midfoot without subluxation at the Lisfranc joint. The subtalar and tibiotalar joints demonstrate no significant findings. There is possible chronic posttraumatic deformity of the distal fibula. There is generalized atrophy of the visualized foot and lower leg musculature. The ankle tendons appear intact. There is nonspecific soft tissue swelling in the great toe without evidence of focal fluid collection, foreign body or soft tissue emphysema. There is an effusion and probable synovial thickening at the first MTP joint. IMPRESSION: 1. Nonspecific soft tissue thickening, fusion and probable synovitis of the first MTP joint. No focal soft tissue abscess identified. 2. Diffuse arthropathic changes throughout the forefoot status post 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 dislocated. There is bony resorption around the first MTP prosthetic components which is nonspecific, although more likely due to particle disease or the patient's underlying arthropathy than superimposed osteomyelitis. Correlation with prior radiographs would be helpful to assess the chronicity of these findings. Electronically Signed   By: Richardean Sale M.D.   On: 08/11/2015 07:54     Medical Consultants:    Orthopedic Surgery  Anti-Infectives:    Vancomycin 08/12/15--->  Zosyn 08/12/15--->  Subjective:   Tara Brown denies any fever or chills. Appetite is good. No dyspnea or cough.  Objective:    Filed Vitals:   08/12/15 2022 08/13/15 0507  BP: 142/80 140/75  Pulse: 89 62  Temp: 98 F (36.7 C) 98 F (36.7 C)  TempSrc: Oral Oral  Resp: 20 17  SpO2: 100% 98%    Intake/Output Summary (Last 24 hours) at 08/13/15 8841 Last data filed at 08/13/15 6606  Gross per 24 hour  Intake      0 ml  Output   2150 ml  Net  -2150 ml   There were no vitals filed for this  visit.  Exam: Gen:  NAD Cardiovascular:  RRR, No M/R/G Respiratory:  Lungs CTAB Gastrointestinal:  Abdomen soft, NT/ND, + BS Extremities:  No C/E/C, left great toe wounds as pictured below       Data Reviewed:    Labs: Basic Metabolic Panel:  Recent Labs Lab 08/07/15 0950 08/10/15 0650 08/12/15 2254 08/13/15 0326  NA 135 142 138 140  K 3.1* 4.6 3.7 3.7  CL 101 106 104 107  CO2 23 27 29 25   GLUCOSE 192* 90 109* 99  BUN 10 14 25* 23*  CREATININE 0.96 1.16* 1.24* 1.11*  CALCIUM 9.0 10.0 8.6* 8.4*   GFR Estimated Creatinine Clearance: 42.2 mL/min (by C-G formula based on Cr of 1.11). Liver Function Tests:  Recent Labs Lab 08/07/15 0950 08/12/15 2254 08/13/15 0326  AST 16 12* 12*  ALT 6* 7* 6*  ALKPHOS 73 71 58  BILITOT 0.5 0.4 0.4  PROT 7.8 7.6 7.0  ALBUMIN 3.0* 3.1* 2.8*   Coagulation profile  Recent Labs Lab 08/12/15 2243  INR 1.06    CBC:  Recent Labs Lab 08/07/15 0950 08/12/15 2254 08/13/15 0326  WBC 6.9 5.8 5.4  NEUTROABS 4.7 3.3  --   HGB 8.3* 7.9* 7.7*  HCT 27.8* 26.5* 25.7*  MCV 75.1* 76.6* 76.5*  PLT 387 409* 350   Cardiac Enzymes:  Recent Labs Lab 08/07/15 0950 08/12/15 2254 08/13/15 0326  TROPONINI <0.03 <0.03 <0.03   CBG:  Recent Labs Lab 08/13/15 0743  GLUCAP 80  Microbiology Recent Results (from the past 240 hour(s))  Urine culture     Status: None   Collection Time: 08/07/15 10:20 AM  Result Value Ref Range Status   Specimen Description URINE, RANDOM  Final   Special Requests NONE  Final   Culture   Final    MULTIPLE SPECIES PRESENT, SUGGEST RECOLLECTION Performed at [facility]    Report Status 08/09/2015 FINAL  Final     Medications:   . amLODipine  5 mg Oral Daily  . cholecalciferol  1,000 Units Oral Daily  . escitalopram  20 mg Oral Daily  . ferrous sulfate  325 mg Oral Q breakfast  . folic acid  1 mg Oral Daily  . heparin  5,000 Units Subcutaneous 3 times per day  . mirtazapine  15  mg Oral QHS  . piperacillin-tazobactam (ZOSYN)  IV  3.375 g Intravenous 3 times per day  . polyethylene glycol  17 g Oral Daily  . silver sulfADIAZINE  1 application Topical Daily  . sodium chloride  3 mL Intravenous Q12H  . vancomycin  500 mg Intravenous Q12H   Continuous Infusions: . sodium chloride 125 mL/hr at 08/12/15 2302    Time spent: 25 minutes.   LOS: 1 day   RAMA,CHRISTINA  [facility] Pager [phone]. If unable to reach me by pager, please call my cell phone at [phone].  *Please refer to [email or web address], password [email or web address] to get updated schedule on who will round on this patient, as hospitalists switch teams weekly. If 7PM-7AM, please contact night-coverage at [email or web address], password [email or web address] for any overnight needs.  08/13/2015, 8:07 AM

## 2015-08-13 NOTE — Progress Notes (Signed)
    [facility]   Reason for visit: Follow up on synovitis  Interval History: I saw her yesterday in [facility] and recommended evaluation by her podiatrist or orthopedist so she was sent her for admission.  She was placed on vancomycin and zosyn by the primary team and orthopedics is going to see her today.  No fever, no chills.   she has a history of psoriatic arthritis and previously has been on remicaid, humira, methotrexate, gold, prednisone.   Physical Exam: Constitutional:  Filed Vitals:   08/12/15 2022 08/13/15 0507  BP: 142/80 140/75  Pulse: 89 62  Temp: 98 F (36.7 C) 98 F (36.7 C)  Resp: 20 17   patient appears in NAD Respiratory: Normal respiratory effort; CTA B Cardiovascular: RRR MS: right great toe with erythema, mild warmth, no significant pain  Review of Systems: Constitutional: negative for fevers, chills and fatigue Gastrointestinal: negative for diarrhea Musculoskeletal: negative for myalgias, arthralgias in other joints  Lab Results  Component Value Date   WBC 5.4 08/13/2015   HGB 7.7* 08/13/2015   HCT 25.7* 08/13/2015   MCV 76.5* 08/13/2015   PLT 350 08/13/2015    Lab Results  Component Value Date   CREATININE 1.11* 08/13/2015   BUN 23* 08/13/2015   NA 140 08/13/2015   K 3.7 08/13/2015   CL 107 08/13/2015   CO2 25 08/13/2015    Lab Results  Component Value Date   ALT 6* 08/13/2015   AST 12* 08/13/2015   ALKPHOS 58 08/13/2015     Microbiology: Recent Results (from the past 240 hour(s))  Urine culture     Status: None   Collection Time: 08/07/15 10:20 AM  Result Value Ref Range Status   Specimen Description URINE, RANDOM  Final   Special Requests NONE  Final   Culture   Final    MULTIPLE SPECIES PRESENT, SUGGEST RECOLLECTION Performed at [facility]    Report Status 08/09/2015 FINAL  Final  Culture, blood (routine x 2)     Status: None (Preliminary result)   Collection Time: 08/12/15 10:43 PM  Result  Value Ref Range Status   Specimen Description BLOOD RIGHT ARM  Final   Special Requests   Final    BOTTLES DRAWN AEROBIC ONLY 5CC Performed at [facility]    Culture PENDING  Incomplete   Report Status PENDING  Incomplete  Culture, blood (routine x 2)     Status: None (Preliminary result)   Collection Time: 08/12/15 10:54 PM  Result Value Ref Range Status   Specimen Description BLOOD RIGHT HAND  Final   Special Requests   Final    BOTTLES DRAWN AEROBIC AND ANAEROBIC 5CC Performed at [facility]    Culture PENDING  Incomplete   Report Status PENDING  Incomplete    Impression/Plan:  1. Synovitis - unclear if this is inflammatory or infectious.  She does have two small openings and drainage but I do not see any obvious pus.  CT does not suggest osteomyelitis.  Did not synovitis. She has a history of psoriatic arthritis. No indication for antibiotics and I stopped the vancomycin and zosyn.    2. Asymptomatic pyuria - no indication for treatment for leukocytes in urine since she is asymptomatic.  Likely a poor urinary collection

## 2015-08-14 LAB — URINE CULTURE: Culture: NO GROWTH

## 2015-08-18 LAB — CULTURE, BLOOD (ROUTINE X 2)
CULTURE: NO GROWTH
Culture: NO GROWTH

## 2015-08-24 NOTE — Discharge Summary (Signed)
  Patient evaluated by Internal Medicine , Hospitalist Service due to chronic foot infection. It was felt by consultant that infection required transfer to medical unit for appropriate antibiotic management and orthopedic consultation, management. As such patient was transferred from [facility] to [facility] under the care of Dr. Darnelle Catalan for ongoing medical care. Ongoing Psychiatric care to be managed by Psychiatric Consultant .   Nehemiah Massed, MD

## 2015-08-31 ENCOUNTER — Encounter (HOSPITAL_BASED_OUTPATIENT_CLINIC_OR_DEPARTMENT_OTHER): Payer: Self-pay | Admitting: *Deleted

## 2015-08-31 ENCOUNTER — Emergency Department (HOSPITAL_BASED_OUTPATIENT_CLINIC_OR_DEPARTMENT_OTHER)
Admission: EM | Admit: 2015-08-31 | Discharge: 2015-09-02 | Disposition: A | Discharge status: Other Acute Inpatient Hospital | Payer: Medicare Other | Attending: Emergency Medicine | Admitting: Emergency Medicine

## 2015-08-31 DIAGNOSIS — F332 Major depressive disorder, recurrent severe without psychotic features: Secondary | ICD-10-CM | POA: Diagnosis present

## 2015-08-31 DIAGNOSIS — N183 Chronic kidney disease, stage 3 (moderate): Secondary | ICD-10-CM | POA: Diagnosis not present

## 2015-08-31 DIAGNOSIS — I129 Hypertensive chronic kidney disease with stage 1 through stage 4 chronic kidney disease, or unspecified chronic kidney disease: Secondary | ICD-10-CM | POA: Diagnosis not present

## 2015-08-31 DIAGNOSIS — Z01818 Encounter for other preprocedural examination: Secondary | ICD-10-CM

## 2015-08-31 DIAGNOSIS — R45851 Suicidal ideations: Secondary | ICD-10-CM | POA: Diagnosis not present

## 2015-08-31 LAB — CBC WITH DIFFERENTIAL/PLATELET
BASOS PCT: 0 %
Basophils Absolute: 0 10*3/uL (ref 0.0–0.1)
EOS ABS: 0 10*3/uL (ref 0.0–0.7)
EOS PCT: 1 %
HCT: 27.7 % — ABNORMAL LOW (ref 36.0–46.0)
Hemoglobin: 8.2 g/dL — ABNORMAL LOW (ref 12.0–15.0)
LYMPHS PCT: 35 %
Lymphs Abs: 1.6 10*3/uL (ref 0.7–4.0)
MCH: 21.9 pg — ABNORMAL LOW (ref 26.0–34.0)
MCHC: 29.6 g/dL — ABNORMAL LOW (ref 30.0–36.0)
MCV: 74.1 fL — ABNORMAL LOW (ref 78.0–100.0)
MONOS PCT: 10 %
Monocytes Absolute: 0.5 10*3/uL (ref 0.1–1.0)
NEUTROS PCT: 54 %
Neutro Abs: 2.4 10*3/uL (ref 1.7–7.7)
PLATELETS: 341 10*3/uL (ref 150–400)
RBC: 3.74 MIL/uL — ABNORMAL LOW (ref 3.87–5.11)
RDW: 18.5 % — AB (ref 11.5–15.5)
WBC: 4.5 10*3/uL (ref 4.0–10.5)

## 2015-08-31 LAB — COMPREHENSIVE METABOLIC PANEL
ALBUMIN: 3.1 g/dL — AB (ref 3.5–5.0)
ALT: 10 U/L — AB (ref 14–54)
AST: 19 U/L (ref 15–41)
Alkaline Phosphatase: 69 U/L (ref 38–126)
Anion gap: 8 (ref 5–15)
BUN: 14 mg/dL (ref 6–20)
CHLORIDE: 102 mmol/L (ref 101–111)
CO2: 24 mmol/L (ref 22–32)
CREATININE: 1.04 mg/dL — AB (ref 0.44–1.00)
Calcium: 8.9 mg/dL (ref 8.9–10.3)
GFR calc Af Amer: >60 mL/min (ref >60)
GFR calc non Af Amer: 59 mL/min — ABNORMAL LOW (ref >60)
GLUCOSE: 111 mg/dL — AB (ref 65–99)
POTASSIUM: 3.9 mmol/L (ref 3.5–5.1)
Sodium: 134 mmol/L — ABNORMAL LOW (ref 135–145)
Total Bilirubin: 0.3 mg/dL (ref 0.3–1.2)
Total Protein: 7.9 g/dL (ref 6.5–8.1)

## 2015-08-31 LAB — RAPID URINE DRUG SCREEN, HOSP PERFORMED
Amphetamines: NOT DETECTED
BENZODIAZEPINES: NOT DETECTED
Barbiturates: NOT DETECTED
COCAINE: NOT DETECTED
OPIATES: NOT DETECTED
Tetrahydrocannabinol: NOT DETECTED

## 2015-08-31 LAB — ACETAMINOPHEN LEVEL

## 2015-08-31 LAB — ETHANOL: Alcohol, Ethyl (B): <5 mg/dL (ref <5)

## 2015-08-31 LAB — SALICYLATE LEVEL: Salicylate Lvl: <4 mg/dL (ref 2.8–30.0)

## 2015-08-31 MED ORDER — ZOLPIDEM TARTRATE 10 MG PO TABS
10.0000 mg | ORAL_TABLET | Freq: Every evening | ORAL | Status: DC | PRN
  Administered 2015-08-31 – 2015-09-01 (×2): 10 mg via ORAL
  Filled 2015-08-31 (×3): qty 1

## 2015-08-31 MED ORDER — FERROUS SULFATE 325 (65 FE) MG PO TABS
325.0000 mg | ORAL_TABLET | Freq: Every day | ORAL | Status: DC
  Administered 2015-09-01 – 2015-09-02 (×2): 325 mg via ORAL
  Filled 2015-08-31 (×3): qty 1

## 2015-08-31 MED ORDER — TRAMADOL HCL 50 MG PO TABS
100.0000 mg | ORAL_TABLET | Freq: Once | ORAL | Status: AC
  Administered 2015-08-31: 100 mg via ORAL
  Filled 2015-08-31: qty 2

## 2015-08-31 MED ORDER — FOLIC ACID 1 MG PO TABS
1.0000 mg | ORAL_TABLET | Freq: Every day | ORAL | Status: DC
  Administered 2015-08-31 – 2015-09-02 (×3): 1 mg via ORAL
  Filled 2015-08-31 (×4): qty 1

## 2015-08-31 MED ORDER — CHOLECALCIFEROL 10 MCG (400 UNIT) PO TABS
400.0000 [IU] | ORAL_TABLET | Freq: Every day | ORAL | Status: DC
  Administered 2015-08-31 – 2015-09-02 (×3): 400 [IU] via ORAL
  Filled 2015-08-31 (×4): qty 1

## 2015-08-31 MED ORDER — LORAZEPAM 1 MG PO TABS
1.0000 mg | ORAL_TABLET | Freq: Three times a day (TID) | ORAL | Status: DC | PRN
  Administered 2015-09-01: 1 mg via ORAL
  Filled 2015-08-31: qty 1

## 2015-08-31 MED ORDER — LISINOPRIL 40 MG PO TABS
40.0000 mg | ORAL_TABLET | Freq: Every day | ORAL | Status: DC
  Administered 2015-08-31 – 2015-09-02 (×2): 40 mg via ORAL
  Filled 2015-08-31: qty 1
  Filled 2015-08-31: qty 4
  Filled 2015-08-31: qty 1

## 2015-08-31 MED ORDER — ESCITALOPRAM OXALATE 10 MG PO TABS
20.0000 mg | ORAL_TABLET | Freq: Every day | ORAL | Status: DC
  Administered 2015-08-31 – 2015-09-02 (×3): 20 mg via ORAL
  Filled 2015-08-31 (×2): qty 2
  Filled 2015-08-31: qty 1
  Filled 2015-08-31 (×2): qty 2

## 2015-08-31 NOTE — ED Notes (Signed)
Sitter at bedside,   Micro meal given

## 2015-08-31 NOTE — ED Notes (Signed)
[facility] called for transport. 

## 2015-08-31 NOTE — ED Notes (Signed)
Patient arrive to [facility] unit via ambulatory with a unsteady gait escorted by [employer].

## 2015-08-31 NOTE — ED Provider Notes (Signed)
CSN: [id number]     Arrival date & time 08/31/15  1650 History   First MD Initiated Contact with Patient 08/31/15 1706     Chief Complaint  Patient presents with  . Medical Clearance     (Consider location/radiation/quality/duration/timing/severity/associated sxs/prior Treatment) HPI  54 year old female with a history of depression, osteoporosis, and arthritis presents with worsening depression. She is having suicidal ideation and wants to kill herself with an overdose of medicine. Patient has chronic pain and contractures from her arthritis since she was age 33 and states there is no point living anymore. She has chronic foot problems from a prior infection, states this is not worsening but not improving either. No fevers. No new injuries or new pain. She states she is eating and drinking much less because she has no appetite. She has been losing weight for months.  Past Medical History  Diagnosis Date  . Osteoporosis   . Psoriatic arthritis (HCC)   . Anemia   . Depression   . Major depressive disorder, recurrent, severe without psychotic features (HCC) 03/11/2015   Past Surgical History  Procedure Laterality Date  . Hip fracture surgery    . Knee surgery    . Foot surgery    . Hand surgery    . Neck surgery     Family History  Problem Relation Age of Onset  . Stroke Neg Hx    Social History  Substance Use Topics  . Smoking status: Never Smoker   . Smokeless tobacco: Never Used  . Alcohol Use: No   OB History    No data available     Review of Systems  Constitutional: Negative for fever.  Gastrointestinal: Negative for vomiting.  Musculoskeletal: Positive for arthralgias.  Psychiatric/Behavioral: Positive for suicidal ideas. Negative for self-injury.  All other systems reviewed and are negative.     Allergies  Review of patient's allergies indicates no known allergies.  Home Medications   Prior to Admission medications   Medication Sig Start Date End Date  Taking? Authorizing Provider  Cholecalciferol (VITAMIN D PO) Take 1 capsule by mouth daily.    Historical Provider, MD  escitalopram (LEXAPRO) 20 MG tablet Take 1 tablet (20 mg total) by mouth daily. 08/13/15   Maryruth Bun Rama, MD  ferrous sulfate 325 (65 FE) MG tablet Take 325 mg by mouth daily with breakfast.    Historical Provider, MD  folic acid (FOLVITE) 1 MG tablet Take 1 mg by mouth daily.    Historical Provider, MD  lisinopril (PRINIVIL,ZESTRIL) 40 MG tablet Take 40 mg by mouth daily. 02/03/15   Historical Provider, MD  mirtazapine (REMERON SOL-TAB) 15 MG disintegrating tablet Take 1 tablet (15 mg total) by mouth at bedtime. 08/13/15   Maryruth Bun Rama, MD  predniSONE (DELTASONE) 10 MG tablet Take 10 mg by mouth daily with breakfast.    Historical Provider, MD  SSD 1 % cream Apply 1 application topically daily. 05/25/15   Historical Provider, MD  traMADol (ULTRAM) 50 MG tablet Take 100 mg by mouth every 6 (six) hours as needed for moderate pain.     Historical Provider, MD  zolpidem (AMBIEN) 10 MG tablet Take 1 tablet (10 mg total) by mouth at bedtime as needed for sleep. 08/13/15   Maryruth Bun Rama, MD   BP 101/79 mmHg  Pulse 82  Temp(Src) 98 F (36.7 C) (Oral)  Resp 18  Ht 5\' 6"  (1.676 m)  Wt 101 lb (45.813 kg)  BMI 16.31 kg/m2  SpO2 100% Physical  Exam  Constitutional: She is oriented to person, place, and time. She appears well-developed. She appears cachectic.  Contractures in all 4 extremities.  HENT:  Head: Normocephalic and atraumatic.  Right Ear: External ear normal.  Left Ear: External ear normal.  Nose: Nose normal.  Eyes: Right eye exhibits no discharge. Left eye exhibits no discharge.  Cardiovascular: Normal rate, regular rhythm and normal heart sounds.   Pulmonary/Chest: Effort normal and breath sounds normal.  Abdominal: Soft. There is no tenderness.  Neurological: She is alert and oriented to person, place, and time.  Skin: Skin is warm and dry.  Psychiatric: She  expresses suicidal ideation. She expresses suicidal plans.  Nursing note and vitals reviewed.   ED Course  Procedures (including critical care time) Labs Review Labs Reviewed  ACETAMINOPHEN LEVEL - Abnormal; Notable for the following:    Acetaminophen (Tylenol), Serum <10 (*)    All other components within normal limits  COMPREHENSIVE METABOLIC PANEL - Abnormal; Notable for the following:    Sodium 134 (*)    Glucose, Bld 111 (*)    Creatinine, Ser 1.04 (*)    Albumin 3.1 (*)    ALT 10 (*)    GFR calc non Af Amer 59 (*)    All other components within normal limits  CBC WITH DIFFERENTIAL/PLATELET - Abnormal; Notable for the following:    RBC 3.74 (*)    Hemoglobin 8.2 (*)    HCT 27.7 (*)    MCV 74.1 (*)    MCH 21.9 (*)    MCHC 29.6 (*)    RDW 18.5 (*)    All other components within normal limits  ETHANOL  SALICYLATE LEVEL  URINE RAPID DRUG SCREEN, HOSP PERFORMED    Imaging Review No results found. I have personally reviewed and evaluated these images and lab results as part of my medical decision-making.   EKG Interpretation None      MDM   Final diagnoses:  Suicidal ideation    Patient with recurrent SI from her chronic arthritis. Patient has a plan to overdose herself. No acute emergent medical conditions are apparent at this time, thus her disposition will be determined by psychiatry. They're recommending admission but currently have no beds available. Due to staffing issues she will be transferred to [facility] long for behavioral health monitoring and waiting placement. Discussed with EDP at [facility], Dr. Clarice Pole, who accepts in transfer.    Pricilla Loveless, MD 08/31/15 [phone]

## 2015-08-31 NOTE — BH Assessment (Signed)
Tele Assessment Note   Sharline Lehane is an 54 y.o. female, Caucasian, divorced, who presents to [facility] ER with a history of depression, osteoporosis, and arthritis presents with worsening depression. She is having suicidal ideation and wants to kill herself with an overdose of medicine. Patient has chronic pain and contractures from her arthritis since she was age 39 and states there is no point living anymore. She has chronic foot problems from a prior infection, states this is not worsening but not improving either. No fevers. No new injuries or new pain. She states she is eating and drinking much less because she has no appetite. She has been losing weight for months.   Patient acknowledges current SI with plan [unspecified, but acknowledges means at home], and history of depression. Patient denies substance abuse history. Patient denies current or past history of AVH. Patient acknowledges past history of inpatient psychiatric care with most recent in 2016 at [facility] for depression. Patient denies current or past history of outpatient psychiatric care. Patient reports not eating and losing up to 70 lbs. In past months, and reports sleeping up to 3 hours per night.  Patient is dressed in scrubs and is alert and oriented x4. Patient speech was within normal limits and motor behavior appeared normal. Patient thought process is coherent. Patient does not appear to be responding to internal stimuli. Patient was cooperative throughout the assessment and states that she is agreeable to inpatient psychiatric treatment.   Diagnosis: 296.33 [F33.2] Major Depressive Disorder, Recurrent Episode, Severe; F50.1 Anorexia Nervosa, Restricting Type  Past Medical History:  Past Medical History  Diagnosis Date  . Osteoporosis   . Psoriatic arthritis (HCC)   . Anemia   . Depression   . Major depressive disorder, recurrent, severe without psychotic features (HCC) 03/11/2015    Past Surgical  History  Procedure Laterality Date  . Hip fracture surgery    . Knee surgery    . Foot surgery    . Hand surgery    . Neck surgery      Family History:  Family History  Problem Relation Age of Onset  . Stroke Neg Hx     Social History:  reports that she has never smoked. She has never used smokeless tobacco. She reports that she does not drink alcohol or use illicit drugs.  Additional Social History:  Alcohol / Drug Use Pain Medications: SEE MAR Prescriptions: SEE MAR Over the Counter: SEE MAR History of alcohol / drug use?: No history of alcohol / drug abuse Longest period of sobriety (when/how long): NA  CIWA: CIWA-Ar BP: 95/69 mmHg Pulse Rate: 70 COWS:    PATIENT STRENGTHS: (choose at least two) Ability for insight Average or above average intelligence Communication skills  Allergies: No Known Allergies  Home Medications:  (Not in a hospital admission)  OB/GYN Status:  No LMP recorded. Patient is postmenopausal.  General Assessment Data Location of Assessment: [facility] ([facility]) TTS Assessment: In system Is this a Tele or Face-to-Face Assessment?: Tele Assessment Is this an Initial Assessment or a Re-assessment for this encounter?: Initial Assessment Marital status: Divorced Horace name: unspecified Is patient pregnant?: No Pregnancy Status: No Living Arrangements: Alone Can pt return to current living arrangement?: Yes Admission Status: Voluntary Is patient capable of signing voluntary admission?: Yes Referral Source: Self/Family/Friend Insurance type: Medicare     Crisis Care Plan Living Arrangements: Alone Name of Psychiatrist: none Name of Therapist: none  Education Status Is patient  currently in school?: No Current Grade: NA Highest grade of school patient has completed: AA in Paralegal, 2 Bachelors degrees in criminal justice related fields Name of school: unknown Contact person: unspecified  Risk to self  with the past 6 months Suicidal Ideation: Yes-Currently Present Has patient been a risk to self within the past 6 months prior to admission? : Yes (depressive episode with anorexia) Suicidal Intent: Yes-Currently Present Has patient had any suicidal intent within the past 6 months prior to admission? : No Is patient at risk for suicide?: Yes Suicidal Plan?: Yes-Currently Present Has patient had any suicidal plan within the past 6 months prior to admission? : Yes Specify Current Suicidal Plan: not specicifed, but pt. expresses unspecified means at home Access to Means: Yes Specify Access to Suicidal Means: unknown What has been your use of drugs/alcohol within the last 12 months?: none Previous Attempts/Gestures: No How many times?: 0 Other Self Harm Risks: self-neglect via not eating Triggers for Past Attempts: None known Intentional Self Injurious Behavior: None Family Suicide History: Yes Recent stressful life event(s): Loss (Comment), Recent negative physical changes (death of friend x1 month ago) Persecutory voices/beliefs?: No Depression: Yes Depression Symptoms: Despondent, Insomnia, Tearfulness, Isolating, Fatigue, Guilt, Loss of interest in usual pleasures, Feeling worthless/self pity Substance abuse history and/or treatment for substance abuse?: No Suicide prevention information given to non-admitted patients: Yes  Risk to Others within the past 6 months Homicidal Ideation: No Does patient have any lifetime risk of violence toward others beyond the six months prior to admission? : No Thoughts of Harm to Others: No Current Homicidal Intent: No Current Homicidal Plan: No Access to Homicidal Means: No Identified Victim: NA History of harm to others?: No Assessment of Violence: None Noted Violent Behavior Description: NA Does patient have access to weapons?: No Criminal Charges Pending?: No Does patient have a court date: No Is patient on probation?:  No  Psychosis Hallucinations: None noted Delusions: None noted  Mental Status Report Appearance/Hygiene: Disheveled Eye Contact: Fair Motor Activity: Unremarkable Speech: Soft Level of Consciousness: Alert Mood: Depressed, Apprehensive, Despair, Fearful, Guilty, Helpless, Sad Affect: Depressed Anxiety Level: Moderate Panic attack frequency: unknown Most recent panic attack: 2 days ago, 1st occurence Thought Processes: Coherent, Relevant Judgement: Unimpaired Orientation: Person, Place, Time, Situation, Appropriate for developmental age, Not oriented Obsessive Compulsive Thoughts/Behaviors: None  Cognitive Functioning Concentration: Normal Memory: Recent Intact, Remote Intact IQ: Average Insight: Fair Impulse Control: Good Appetite: Poor Weight Loss: 70 Weight Gain: 0 Sleep: Decreased Total Hours of Sleep: 2 Vegetative Symptoms: Staying in bed, Decreased grooming  ADLScreening [facility] Assessment Services) Patient's cognitive ability adequate to safely complete daily activities?: Yes Patient able to express need for assistance with ADLs?: Yes Independently performs ADLs?: No (SEE MAR health issues)  Prior Inpatient Therapy Prior Inpatient Therapy: Yes Prior Therapy Dates: 2x in Jan 14, 2015, 1x in 01-14-08 Prior Therapy Facilty/Provider(s): [facility] 2015-01-14, [facility] 01-14-08 Reason for Treatment: depression Jan 14, 2015. mom died, Jan 14, 2008  Prior Outpatient Therapy Prior Outpatient Therapy: No Prior Therapy Dates: NA Prior Therapy Facilty/Provider(s): NA Reason for Treatment: NA Does patient have an ACCT team?: No Does patient have Intensive In-House Services?  : No Does patient have [facility] services? : Unknown Does patient have P4CC services?: Unknown  ADL Screening (condition at time of admission) Patient's cognitive ability adequate to safely complete daily activities?: Yes Is the patient deaf or have difficulty hearing?: No Does the patient have difficulty seeing, even when wearing  glasses/contacts?: No Does the patient have difficulty concentrating, remembering, or  making decisions?: No Patient able to express need for assistance with ADLs?: Yes Does the patient have difficulty dressing or bathing?: Yes Independently performs ADLs?: No (SEE MAR health issues) Communication: Independent Dressing (OT): Needs assistance Is this a change from baseline?: Pre-admission baseline Grooming: Needs assistance Is this a change from baseline?: Pre-admission baseline Feeding: Independent Bathing: Needs assistance Is this a change from baseline?: Pre-admission baseline Toileting: Needs assistance Is this a change from baseline?: Pre-admission baseline In/Out Bed: Needs assistance Is this a change from baseline?: Pre-admission baseline Walks in Home: Needs assistance Is this a change from baseline?: Pre-admission baseline Does the patient have difficulty walking or climbing stairs?: Yes Weakness of Legs: Both (SEE MAR health issues) Weakness of Arms/Hands: Both (SEE MAR health issues)  Home Assistive Devices/Equipment Home Assistive Devices/Equipment: Cane (specify quad or straight) (cane present unsure type)    Abuse/Neglect Assessment (Assessment to be complete while patient is alone) Physical Abuse: Denies Verbal Abuse: Denies Sexual Abuse: Denies Exploitation of patient/patient's resources: Denies Self-Neglect: Yes, present (Comment) (anorexia/ recently pt has ate but mostly does not / loss of weight more than 50 lbs.) Values / Beliefs Cultural Requests During Hospitalization: None Spiritual Requests During Hospitalization: None   Advance Directives (For Healthcare) Does patient have an advance directive?: No Would patient like information on creating an advanced directive?: No - patient declined information Nutrition Screen- [facility] Adult/WL/AP Patient's home diet: Regular  Additional Information 1:1 In Past 12 Months?: No CIRT Risk: No Elopement Risk: No Does  patient have medical clearance?: Yes     Disposition: Per Karleen Hampshire, PA meets inpatient psychiatric care criteria. Disposition Initial Assessment Completed for this Encounter: Yes Disposition of Patient: Inpatient treatment program Type of inpatient treatment program: Adult  Hipolito Bayley 08/31/2015 9:09 PM

## 2015-08-31 NOTE — Progress Notes (Addendum)
Patient meets inpatient criteria, per [facility] PA.  Patient was referred at: [facility] - demographics given to [facility]. Vernona Rieger care coordinator unavailable to inquire about beds.Education officer, environmental for Vernona Rieger at [facility] about patient and provided call back number.   [facility] - per [place], fax referral for review. [facility] - left voicemail.  Melbourne Abts, LCSWA Disposition staff 08/31/2015 10:44 PM

## 2015-08-31 NOTE — ED Notes (Signed)
Bed: SHU83 Expected date:  Expected time:  Means of arrival:  Comments: Hold for patient from [facility]

## 2015-08-31 NOTE — ED Notes (Signed)
States she cannot physically do any thing to care for herself. She feels no point in living. She plans to overdose on her medication. Denies suicidal ideations. A friend dropped her off.

## 2015-09-01 ENCOUNTER — Emergency Department (HOSPITAL_COMMUNITY): Payer: Medicare Other

## 2015-09-01 DIAGNOSIS — R45851 Suicidal ideations: Secondary | ICD-10-CM | POA: Diagnosis not present

## 2015-09-01 DIAGNOSIS — F332 Major depressive disorder, recurrent severe without psychotic features: Secondary | ICD-10-CM

## 2015-09-01 LAB — C-REACTIVE PROTEIN: CRP: 12.3 mg/dL — ABNORMAL HIGH (ref <1.0)

## 2015-09-01 LAB — SEDIMENTATION RATE: SED RATE: 92 mm/h — AB (ref 0–22)

## 2015-09-01 MED ORDER — TRAMADOL HCL 50 MG PO TABS
50.0000 mg | ORAL_TABLET | Freq: Four times a day (QID) | ORAL | Status: DC | PRN
  Administered 2015-09-01 (×2): 50 mg via ORAL
  Filled 2015-09-01 (×2): qty 1

## 2015-09-01 NOTE — Progress Notes (Signed)
Tara Brown at [facility] eating disorder clinic states she received a referral for pt last night, however, pt is out of age range requirement to be considered at [facility] (accepts ages 15-30).   Ilean Skill, MSW, LCSW Clinical Social Work, Disposition  09/01/2015 [phone]

## 2015-09-01 NOTE — BH Assessment (Signed)
[facility] Assessment Progress Note  The following facilities have been contacted to seek placement for this pt, with results as noted:  Beds available, information sent, decision pending:  [facility] [place] [facility] [place]. [facility] [place]   At capacity but accepting referrals for future consideration; information sent:  [facility]   At capacity:  [facility] [place], [place] Triage Specialist [phone]

## 2015-09-01 NOTE — ED Notes (Addendum)
Patient denies SI, HI and AVH at this time. Patients admits to severe depression due to the decline in her health. Patient reports increase in symptoms a week ago. Patient oriented to unit. Patient given apple juice. Plan of care discussed with patient. Patient voices no complaints or concerns at this time. Encouragement and support provided and safety maintain. Q 15 min safety checks in place.

## 2015-09-01 NOTE — ED Notes (Signed)
Dr. Iva Knapp at bedside. 

## 2015-09-01 NOTE — ED Notes (Signed)
Patient requesting pain medication.  One time order of tramadol complete.  No other pain meds ordered at this time.  Informed patient that provider will be rounding soon and we can address it then.

## 2015-09-01 NOTE — ED Provider Notes (Signed)
By signing my name below, I, Emmanuella Mensah, attest that this documentation has been prepared under the direction and in the presence of Devoria Albe, MD. Electronically Signed: Angelene Giovanni, ED Scribe. 09/01/2015. 12:31 AM.   HPI Comments: Tara Brown is a 54 y.o. female who presents to the Emergency Department complaining of gradually worsening depression onset several weeks ago. She reports that she is unable to perform activities of daily living due to her arthritis and osteoporosis and that makes her frustrated. She states that she was seen here 3 week ago for depression and she was supposed to have surgery on her foot but the Orthopedics was unable to come in. She adds that she was given resources to follow up with the orthopedics but cancelled the appointment because she was unable to get a ride. She denies getting any medication for her foot. Patient states "I need medication to make me feel better about my situation".  PCP: Dr. Sharmon Revere. Pt states that she does not plan on seeing her anymore but will need a referral for another PCP.    Patient has flat affect, she is alert and cooperative. Left foot: redness ans welling on the NTP joint if her great toe with an area of drainage on the dorsum and the palmar side.  Extremities: a lot of deformities of fingers and toes.   Review of patient's prior visit shows she was seen by an orthopedic PA and was discussed with Dr. Lajoyce Corners. She had a follow-up appointment with him on December 5 which she states she missed. They were going to discuss removing her hardware. She also was seen by Dr. Luciana Axe, infectious disease on December 2, he stopped her antibiotics because he did not feel like she had an active infection.   Results for orders placed or performed during the hospital encounter of 08/31/15  Acetaminophen level  Result Value Ref Range   Acetaminophen (Tylenol), Serum <10 (L) 10 - 30 ug/mL  Comprehensive metabolic panel  Result Value  Ref Range   Sodium 134 (L) 135 - 145 mmol/L   Potassium 3.9 3.5 - 5.1 mmol/L   Chloride 102 101 - 111 mmol/L   CO2 24 22 - 32 mmol/L   Glucose, Bld 111 (H) 65 - 99 mg/dL   BUN 14 6 - 20 mg/dL   Creatinine, Ser 9.41 (H) 0.44 - 1.00 mg/dL   Calcium 8.9 8.9 - 74.0 mg/dL   Total Protein 7.9 6.5 - 8.1 g/dL   Albumin 3.1 (L) 3.5 - 5.0 g/dL   AST 19 15 - 41 U/L   ALT 10 (L) 14 - 54 U/L   Alkaline Phosphatase 69 38 - 126 U/L   Total Bilirubin 0.3 0.3 - 1.2 mg/dL   GFR calc non Af Amer 59 (L) >60 mL/min   GFR calc Af Amer >60 >60 mL/min   Anion gap 8 5 - 15  Ethanol  Result Value Ref Range   Alcohol, Ethyl (B) <5 <5 mg/dL  Salicylate level  Result Value Ref Range   Salicylate Lvl <4.0 2.8 - 30.0 mg/dL  CBC with Differential  Result Value Ref Range   WBC 4.5 4.0 - 10.5 K/uL   RBC 3.74 (L) 3.87 - 5.11 MIL/uL   Hemoglobin 8.2 (L) 12.0 - 15.0 g/dL   HCT 81.4 (L) 48.1 - 85.6 %   MCV 74.1 (L) 78.0 - 100.0 fL   MCH 21.9 (L) 26.0 - 34.0 pg   MCHC 29.6 (L) 30.0 - 36.0 g/dL   RDW  18.5 (H) 11.5 - 15.5 %   Platelets 341 150 - 400 K/uL   Neutrophils Relative % 54 %   Lymphocytes Relative 35 %   Monocytes Relative 10 %   Eosinophils Relative 1 %   Basophils Relative 0 %   Neutro Abs 2.4 1.7 - 7.7 K/uL   Lymphs Abs 1.6 0.7 - 4.0 K/uL   Monocytes Absolute 0.5 0.1 - 1.0 K/uL   Eosinophils Absolute 0.0 0.0 - 0.7 K/uL   Basophils Absolute 0.0 0.0 - 0.1 K/uL   RBC Morphology ELLIPTOCYTES   Urine rapid drug screen (hosp performed)  Result Value Ref Range   Opiates NONE DETECTED NONE DETECTED   Cocaine NONE DETECTED NONE DETECTED   Benzodiazepines NONE DETECTED NONE DETECTED   Amphetamines NONE DETECTED NONE DETECTED   Tetrahydrocannabinol NONE DETECTED NONE DETECTED   Barbiturates NONE DETECTED NONE DETECTED  Sedimentation rate  Result Value Ref Range   Sed Rate 92 (H) 0 - 22 mm/hr    Dg Foot Complete Left  09/01/2015  CLINICAL DATA:  54 year old female with redness and swelling and  drainage around the first MTP joint. EXAM: LEFT FOOT - COMPLETE 3+ VIEW COMPARISON:  CT dated 08/10/2015 FINDINGS: There is total arthroplasty of the first MTP joint with stable appearing lateral subluxation of the first and second MTP joints. There is severe arthropathy at the first MTP joint with resorption of the bone adjacent to the orthopedic hardware. There is advanced diffuse osteopenia with degenerative changes of the joints. No definite acute fracture identified. There is soft tissue swelling at the first MTP joint which may represent synovitis or superimposed infection. No soft tissue gas identified. IMPRESSION: No acute fracture. First MTP arthroplasty with stable subluxation and arthropathy. There is soft tissue swelling surrounding the first MTP joint. Clinical correlation is recommended to evaluate for synovitis or infection. No soft tissue gas identified. Electronically Signed   By: Elgie Collard M.D.   On: 09/01/2015 01:27   Ct Foot Left W Contrast  08/11/2015  CLINICAL DATA:  Left great toe open sores and erythema for 1 week. Possible hardware rejection. Evaluate for osteomyelitis. Initial encounter. Marland Kitchen IMPRESSION: 1. Nonspecific soft tissue thickening, fusion and probable synovitis of the first MTP joint. No focal soft tissue abscess identified. 2. Diffuse arthropathic changes throughout the forefoot status post 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 dislocated. There is bony resorption around the first MTP prosthetic components which is nonspecific, although more likely due to particle disease or the patient's underlying arthropathy than superimposed osteomyelitis. Correlation with prior radiographs would be helpful to assess the chronicity of these findings. Electronically Signed   By: Carey Bullocks M.D.   On: 08/11/2015 07:54     Diagnoses that have been ruled out:  None  Diagnoses that are still under consideration:  None  Final diagnoses:  Suicidal  ideation    Disposition pending   Devoria Albe, MD, Concha Pyo, MD 09/01/15 [phone]

## 2015-09-01 NOTE — ED Notes (Signed)
Patient seen by provider and NP.  She states she is unable to complete her daily activities due to physical limitations.  Patient appears sad and depressed.  She states that is she had the medications to overdose, she would.  Patient has limited to no support from family or any outside assistance.  Patient would like to remain independent, however, is not able to properly care for herself.  EDP consult requested for patient to review medications and assess patient.  Patient needs referrals to facilities that will be able to assist her.  Patient given 50 mg tramadol for pain.

## 2015-09-01 NOTE — Progress Notes (Signed)
Pt discussed in [facility] progression meeting - possible need of community Cm  CM reviewed EPIC notes and pt listed pcp Cm spoke with pt who agrees to have CM check to see if she is eligible for [facility] CM services  Pt states she wants to stop seeing her pcp but will see the pa [place] there and will accept a list of other medicare providers [place] [id number]

## 2015-09-01 NOTE — Progress Notes (Signed)
Pt to be referred to [facility] geri-psych pending response Pt confirmed with CM that she has used [facility] home health previously but their services ended related no further medical need for services Cm will not proceed with consulting for these services or contacting pcp for possible CAPs/PDNs services through pcp until final d/c plan confirmed Updated [facility] NP/ TTS Pt states she is able to get her food and medications but would not tell Cm who assists her Confirmed with [facility] staff that pt is able to feed self with help but needs assist with all other ADLs including bathing, dressing, toileting etc  Cm entered in d/c instructions for pt  Please use the list of medicare providers within zip code [address] to assist with finding another family doctor as needed   As needed This list of providers from medicare.gov given to you be the [facility] ED case manager can assist with finding a new doctor  doctors that visit in the home -some charge a travel expense fee fro the visit Call  [facility] [address] [place], [place] [address] Dr Florentina Jenny --[phone] fax [phone] to [facility] referral intake [facility] [address] [place] [address] [phone] Accepts MCR, [facility]  [employer] [place] [phone] from [place]  [phone] from all other areas Provides rides for seniors age 74 & over who are ambulatory to medical appointments in [facility] & to regional medical facilities. Individu  pharmacies that deliver in [place]  Call As needed [facility] [address] [place], [place] [address] [phone]   [facility] [address] [place], [place] [address] [phone]  [facility] [address], [address] [place], [place] [phone]

## 2015-09-01 NOTE — Consult Note (Signed)
[facility] Face-to-Face Psychiatry Consult   Reason for Consult:  depression, anxiety, hopelessness and helplessness. Referring Physician:  EDP Patient Identification: Tara Brown MRN:  [id number] Principal Diagnosis: Major depressive disorder, recurrent, severe without psychotic features [facility]) Diagnosis:   Patient Active Problem List   Diagnosis Date Noted  . Major depressive disorder, recurrent, severe without psychotic features ([place]) [F33.2] 03/11/2015    Priority: High  . Stage III chronic kidney disease [N18.3] 08/13/2015  . Penetrating foot wound [S91.339A] 08/12/2015  . Left foot infection [L08.9] 08/12/2015  . Osteomyelitis ([place]) [M86.9]   . Hypokalemia [E87.6]   . Anemia, iron deficiency [D50.9]   . Essential hypertension [I10]   . Severe episode of recurrent major depressive disorder, without psychotic features ([place]) [F33.2]   . Psoriatic arthritis ([place]) [L40.50] 03/11/2015  . Osteoporosis [M81.0] 03/11/2015  . Anemia of chronic disease [D63.8] 03/08/2015    Total Time spent with patient: 45 minutes  Subjective:   Tara Brown is a 54 y.o. female patient admitted with depression, anxiety, hopelessness and helplessness.Marland Kitchen  HPI:  Caucasian female, 54 years old was evaluated for feeling suicidal.  Patient reports her stressors includes not being able to take care of herself due to multiple medical illness.  Patient is having difficulty with mobility due to Osteoporosis and Psoriatic Arthritis.  Patient was hospitalized last Month for same reason at [facility] and was later sent to the medical floor.  Patient  Plans to OD on her medications.   Patient lives alone and reports that she feels lonely all the time.  Patient has surgery to her right foot that got infected.  Patient states that as long as she is having all these medical conditions she will be depressed and not wanting to live.  She is unable to contract for safety and have been accepted for admission.  We will seek placement at  any facility with available bed.  Past Psychiatric History:  MDD,   Risk to Self: Suicidal Ideation: Yes-Currently Present Suicidal Intent: Yes-Currently Present Is patient at risk for suicide?: Yes Suicidal Plan?: Yes-Currently Present Specify Current Suicidal Plan: not specicifed, but pt. expresses unspecified means at home Access to Means: Yes Specify Access to Suicidal Means: unknown What has been your use of drugs/alcohol within the last 12 months?: none How many times?: 0 Other Self Harm Risks: self-neglect via not eating Triggers for Past Attempts: None known Intentional Self Injurious Behavior: None Risk to Others: Homicidal Ideation: No Thoughts of Harm to Others: No Current Homicidal Intent: No Current Homicidal Plan: No Access to Homicidal Means: No Identified Victim: NA History of harm to others?: No Assessment of Violence: None Noted Violent Behavior Description: NA Does patient have access to weapons?: No Criminal Charges Pending?: No Does patient have a court date: No Prior Inpatient Therapy: Prior Inpatient Therapy: Yes Prior Therapy Dates: 2x in 12/01/14, 1x in 2007/12/02 Prior Therapy Facilty/Provider(s): [facility] 2016, [facility] 02-Dec-2007 Reason for Treatment: depression Dec 01, 2014. mom died, 02-Dec-2007 Prior Outpatient Therapy: Prior Outpatient Therapy: No Prior Therapy Dates: NA Prior Therapy Facilty/Provider(s): NA Reason for Treatment: NA Does patient have an ACCT team?: No Does patient have Intensive In-House Services?  : No Does patient have [facility] services? : Unknown Does patient have P4CC services?: Unknown  Past Medical History:  Past Medical History  Diagnosis Date  . Osteoporosis   . Psoriatic arthritis ([place])   . Anemia   . Depression   . Major depressive disorder, recurrent, severe without psychotic features ([place]) 03/11/2015    Past Surgical  History  Procedure Laterality Date  . Hip fracture surgery    . Knee surgery    . Foot surgery    . Hand surgery    . Neck  surgery     Family History:  Family History  Problem Relation Age of Onset  . Stroke Neg Hx    Family Psychiatric  History: Denies Social History:  History  Alcohol Use No     History  Drug Use No    Social History   Social History  . Marital Status: Divorced    Spouse Name: N/A  . Number of Children: N/A  . Years of Education: N/A   Social History Main Topics  . Smoking status: Never Smoker   . Smokeless tobacco: Never Used  . Alcohol Use: No  . Drug Use: No  . Sexual Activity: No   Other Topics Concern  . None   Social History Narrative   Additional Social History:    Pain Medications: SEE MAR Prescriptions: SEE MAR Over the Counter: SEE MAR History of alcohol / drug use?: No history of alcohol / drug abuse Longest period of sobriety (when/how long): NA   Allergies:  No Known Allergies  Labs:  Results for orders placed or performed during the hospital encounter of 08/31/15 (from the past 48 hour(s))  Urine rapid drug screen (hosp performed)     Status: None   Collection Time: 08/31/15  5:14 PM  Result Value Ref Range   Opiates NONE DETECTED NONE DETECTED   Cocaine NONE DETECTED NONE DETECTED   Benzodiazepines NONE DETECTED NONE DETECTED   Amphetamines NONE DETECTED NONE DETECTED   Tetrahydrocannabinol NONE DETECTED NONE DETECTED   Barbiturates NONE DETECTED NONE DETECTED    Comment:        DRUG SCREEN FOR MEDICAL PURPOSES ONLY.  IF CONFIRMATION IS NEEDED FOR ANY PURPOSE, NOTIFY LAB WITHIN 5 DAYS.        LOWEST DETECTABLE LIMITS FOR URINE DRUG SCREEN Drug Class       Cutoff (ng/mL) Amphetamine      1000 Barbiturate      200 Benzodiazepine   035 Tricyclics       465 Opiates          300 Cocaine          300 THC              50   Acetaminophen level     Status: Abnormal   Collection Time: 08/31/15  5:31 PM  Result Value Ref Range   Acetaminophen (Tylenol), Serum <10 (L) 10 - 30 ug/mL    Comment:        THERAPEUTIC CONCENTRATIONS  VARY SIGNIFICANTLY. A RANGE OF 10-30 ug/mL MAY BE AN EFFECTIVE CONCENTRATION FOR MANY PATIENTS. HOWEVER, SOME ARE BEST TREATED AT CONCENTRATIONS OUTSIDE THIS RANGE. ACETAMINOPHEN CONCENTRATIONS >150 ug/mL AT 4 HOURS AFTER INGESTION AND >50 ug/mL AT 12 HOURS AFTER INGESTION ARE OFTEN ASSOCIATED WITH TOXIC REACTIONS.   Comprehensive metabolic panel     Status: Abnormal   Collection Time: 08/31/15  5:31 PM  Result Value Ref Range   Sodium 134 (L) 135 - 145 mmol/L   Potassium 3.9 3.5 - 5.1 mmol/L   Chloride 102 101 - 111 mmol/L   CO2 24 22 - 32 mmol/L   Glucose, Bld 111 (H) 65 - 99 mg/dL   BUN 14 6 - 20 mg/dL   Creatinine, Ser 1.04 (H) 0.44 - 1.00 mg/dL   Calcium 8.9 8.9 - 10.3 mg/dL  Total Protein 7.9 6.5 - 8.1 g/dL   Albumin 3.1 (L) 3.5 - 5.0 g/dL   AST 19 15 - 41 U/L   ALT 10 (L) 14 - 54 U/L   Alkaline Phosphatase 69 38 - 126 U/L   Total Bilirubin 0.3 0.3 - 1.2 mg/dL   GFR calc non Af Amer 59 (L) >60 mL/min   GFR calc Af Amer >60 >60 mL/min    Comment: (NOTE) The eGFR has been calculated using the CKD EPI equation. This calculation has not been validated in all clinical situations. eGFR's persistently <60 mL/min signify possible Chronic Kidney Disease.    Anion gap 8 5 - 15  Ethanol     Status: None   Collection Time: 08/31/15  5:31 PM  Result Value Ref Range   Alcohol, Ethyl (B) <5 <5 mg/dL    Comment:        LOWEST DETECTABLE LIMIT FOR SERUM ALCOHOL IS 5 mg/dL FOR MEDICAL PURPOSES ONLY   Salicylate level     Status: None   Collection Time: 08/31/15  5:31 PM  Result Value Ref Range   Salicylate Lvl <8.9 2.8 - 30.0 mg/dL  CBC with Differential     Status: Abnormal   Collection Time: 08/31/15  5:31 PM  Result Value Ref Range   WBC 4.5 4.0 - 10.5 K/uL   RBC 3.74 (L) 3.87 - 5.11 MIL/uL   Hemoglobin 8.2 (L) 12.0 - 15.0 g/dL   HCT 27.7 (L) 36.0 - 46.0 %   MCV 74.1 (L) 78.0 - 100.0 fL   MCH 21.9 (L) 26.0 - 34.0 pg   MCHC 29.6 (L) 30.0 - 36.0 g/dL   RDW 18.5  (H) 11.5 - 15.5 %   Platelets 341 150 - 400 K/uL   Neutrophils Relative % 54 %   Lymphocytes Relative 35 %   Monocytes Relative 10 %   Eosinophils Relative 1 %   Basophils Relative 0 %   Neutro Abs 2.4 1.7 - 7.7 K/uL   Lymphs Abs 1.6 0.7 - 4.0 K/uL   Monocytes Absolute 0.5 0.1 - 1.0 K/uL   Eosinophils Absolute 0.0 0.0 - 0.7 K/uL   Basophils Absolute 0.0 0.0 - 0.1 K/uL   RBC Morphology ELLIPTOCYTES     Comment: HYPOCHROMIA  Sedimentation rate     Status: Abnormal   Collection Time: 09/01/15  1:20 AM  Result Value Ref Range   Sed Rate 92 (H) 0 - 22 mm/hr  C-reactive protein     Status: Abnormal   Collection Time: 09/01/15  1:20 AM  Result Value Ref Range   CRP 12.3 (H) <1.0 mg/dL    Comment: Performed at [facility]    Current Facility-Administered Medications  Medication Dose Route Frequency Provider Last Rate Last Dose  . cholecalciferol (VITAMIN D) tablet 400 Units  400 Units Oral Daily Sherwood Gambler, MD   400 Units at 09/01/15 0830  . escitalopram (LEXAPRO) tablet 20 mg  20 mg Oral Daily Sherwood Gambler, MD   20 mg at 09/01/15 0831  . ferrous sulfate tablet 325 mg  325 mg Oral Q breakfast Sherwood Gambler, MD   325 mg at 09/01/15 0829  . folic acid (FOLVITE) tablet 1 mg  1 mg Oral Daily Sherwood Gambler, MD   1 mg at 09/01/15 0830  . lisinopril (PRINIVIL,ZESTRIL) tablet 40 mg  40 mg Oral Daily Sherwood Gambler, MD   Stopped at 09/01/15 [phone]  . LORazepam (ATIVAN) tablet 1 mg  1 mg Oral Q8H PRN  Sherwood Gambler, MD   1 mg at 09/01/15 [phone]  . traMADol (ULTRAM) tablet 50 mg  50 mg Oral Q6H PRN Delfin Gant, NP   50 mg at 09/01/15 0945  . zolpidem (AMBIEN) tablet 10 mg  10 mg Oral QHS PRN Sherwood Gambler, MD   10 mg at 08/31/15 2344   Current Outpatient Prescriptions  Medication Sig Dispense Refill  . Cholecalciferol (VITAMIN D PO) Take 1 capsule by mouth daily.    Marland Kitchen escitalopram (LEXAPRO) 20 MG tablet Take 1 tablet (20 mg total) by mouth daily. 30 tablet 0  . ferrous  sulfate 325 (65 FE) MG tablet Take 325 mg by mouth daily with breakfast.    . folic acid (FOLVITE) 1 MG tablet Take 1 mg by mouth daily.    Marland Kitchen lisinopril (PRINIVIL,ZESTRIL) 40 MG tablet Take 40 mg by mouth daily.    . traMADol (ULTRAM) 50 MG tablet Take 100 mg by mouth every 6 (six) hours as needed for moderate pain.     Marland Kitchen zolpidem (AMBIEN) 10 MG tablet Take 1 tablet (10 mg total) by mouth at bedtime as needed for sleep. 30 tablet 0  . mirtazapine (REMERON SOL-TAB) 15 MG disintegrating tablet Take 1 tablet (15 mg total) by mouth at bedtime. 30 tablet 0    Musculoskeletal: Strength & Muscle Tone: seen lying in bed Gait & Station: seen lying in bed Patient leans: see above  Psychiatric Specialty Exam: Review of Systems  Constitutional: Negative.   HENT: Negative.   Eyes: Negative.   Respiratory: Negative.   Cardiovascular: Negative.   Gastrointestinal: Negative.   Genitourinary:       Hx kidney disease, stage 3  Musculoskeletal:       Hx Osteoporosis, osteomyelitis and Psoriatic Arthritis.  Skin: Negative.   Neurological: Negative.   Endo/Heme/Allergies: Negative.     Blood pressure 97/53, pulse 88, temperature 98.7 F (37.1 C), temperature source Oral, resp. rate 18, height 5' 6"  (1.676 m), weight 45.813 kg (101 lb), SpO2 97 %.Body mass index is 16.31 kg/(m^2).  General Appearance: Casual  Eye Contact::  Good  Speech:  Clear and Coherent and Normal Rate  Volume:  Normal  Mood:  Anxious and Depressed  Affect:  Congruent, Depressed and Flat  Thought Process:  Coherent, Goal Directed and Intact  Orientation:  Full (Time, Place, and Person)  Thought Content:  WDL  Suicidal Thoughts:  Yes.  with intent/plan  Homicidal Thoughts:  No  Memory:  Immediate;   Good Recent;   Good Remote;   Good  Judgement:  Fair  Insight:  Good  Psychomotor Activity:  Psychomotor Retardation  Concentration:  Good  Recall:  Good  Fund of Knowledge:Good  Language: Good  Akathisia:  NA  Handed:   Right  AIMS (if indicated):     Assets:  Desire for Improvement  ADL's:  Intact  Cognition: WNL  Sleep:      Treatment Plan Summary: Daily contact with patient to assess and evaluate symptoms and progress in treatment and Medication management  Disposition:  Accepted for admission, will be seeking placement at any facility with available bed.  We will resume all home medications.   We will order EKG and Chest Xray for preadmission to [facility]   PMHNP-BC 09/01/2015 1:49 PM

## 2015-09-01 NOTE — ED Notes (Signed)
Patient states she takes 10 mg predisone every day.  Relayed to NP.  Consult requested from EDP.

## 2015-09-02 ENCOUNTER — Inpatient Hospital Stay (HOSPITAL_COMMUNITY)
Admission: AD | Admit: 2015-09-02 | Discharge: 2015-09-07 | DRG: 885 | Disposition: A | Discharge status: Other Acute Inpatient Hospital | Payer: Medicare Other | Source: Intra-hospital | Attending: Psychiatry | Admitting: Psychiatry

## 2015-09-02 ENCOUNTER — Encounter (HOSPITAL_COMMUNITY): Payer: Self-pay

## 2015-09-02 DIAGNOSIS — R45851 Suicidal ideations: Secondary | ICD-10-CM | POA: Diagnosis present

## 2015-09-02 DIAGNOSIS — L03032 Cellulitis of left toe: Secondary | ICD-10-CM | POA: Diagnosis not present

## 2015-09-02 DIAGNOSIS — F419 Anxiety disorder, unspecified: Secondary | ICD-10-CM | POA: Diagnosis present

## 2015-09-02 DIAGNOSIS — G4700 Insomnia, unspecified: Secondary | ICD-10-CM | POA: Diagnosis present

## 2015-09-02 DIAGNOSIS — L4050 Arthropathic psoriasis, unspecified: Secondary | ICD-10-CM | POA: Diagnosis present

## 2015-09-02 DIAGNOSIS — T847XXA Infection and inflammatory reaction due to other internal orthopedic prosthetic devices, implants and grafts, initial encounter: Secondary | ICD-10-CM

## 2015-09-02 DIAGNOSIS — G8929 Other chronic pain: Secondary | ICD-10-CM | POA: Diagnosis present

## 2015-09-02 DIAGNOSIS — I129 Hypertensive chronic kidney disease with stage 1 through stage 4 chronic kidney disease, or unspecified chronic kidney disease: Secondary | ICD-10-CM | POA: Diagnosis present

## 2015-09-02 DIAGNOSIS — M810 Age-related osteoporosis without current pathological fracture: Secondary | ICD-10-CM | POA: Diagnosis present

## 2015-09-02 DIAGNOSIS — D638 Anemia in other chronic diseases classified elsewhere: Secondary | ICD-10-CM | POA: Diagnosis present

## 2015-09-02 DIAGNOSIS — M86672 Other chronic osteomyelitis, left ankle and foot: Secondary | ICD-10-CM | POA: Diagnosis not present

## 2015-09-02 DIAGNOSIS — M1990 Unspecified osteoarthritis, unspecified site: Secondary | ICD-10-CM | POA: Diagnosis present

## 2015-09-02 DIAGNOSIS — D509 Iron deficiency anemia, unspecified: Secondary | ICD-10-CM | POA: Diagnosis not present

## 2015-09-02 DIAGNOSIS — F332 Major depressive disorder, recurrent severe without psychotic features: Principal | ICD-10-CM | POA: Diagnosis present

## 2015-09-02 DIAGNOSIS — L97529 Non-pressure chronic ulcer of other part of left foot with unspecified severity: Secondary | ICD-10-CM | POA: Diagnosis present

## 2015-09-02 DIAGNOSIS — Z23 Encounter for immunization: Secondary | ICD-10-CM | POA: Diagnosis not present

## 2015-09-02 DIAGNOSIS — N183 Chronic kidney disease, stage 3 (moderate): Secondary | ICD-10-CM | POA: Diagnosis present

## 2015-09-02 DIAGNOSIS — I10 Essential (primary) hypertension: Secondary | ICD-10-CM | POA: Diagnosis not present

## 2015-09-02 MED ORDER — LISINOPRIL 40 MG PO TABS
40.0000 mg | ORAL_TABLET | Freq: Every day | ORAL | Status: DC
  Administered 2015-09-03 – 2015-09-07 (×4): 40 mg via ORAL
  Filled 2015-09-02 (×4): qty 1
  Filled 2015-09-02: qty 2
  Filled 2015-09-02 (×3): qty 1

## 2015-09-02 MED ORDER — PREDNISONE 20 MG PO TABS
10.0000 mg | ORAL_TABLET | Freq: Every day | ORAL | Status: DC
  Administered 2015-09-02: 10 mg via ORAL
  Filled 2015-09-02: qty 1

## 2015-09-02 MED ORDER — ACETAMINOPHEN 325 MG PO TABS: 650.0000 mg | ORAL_TABLET | Freq: Four times a day (QID) | ORAL | Status: DC | PRN

## 2015-09-02 MED ORDER — MAGNESIUM HYDROXIDE 400 MG/5ML PO SUSP: 30.0000 mL | Freq: Every day | ORAL | Status: DC | PRN

## 2015-09-02 MED ORDER — ESCITALOPRAM OXALATE 20 MG PO TABS
20.0000 mg | ORAL_TABLET | Freq: Every day | ORAL | Status: DC
  Administered 2015-09-03: 20 mg via ORAL
  Filled 2015-09-02 (×3): qty 1

## 2015-09-02 MED ORDER — ZOLPIDEM TARTRATE 5 MG PO TABS
5.0000 mg | ORAL_TABLET | Freq: Every evening | ORAL | Status: DC | PRN
  Administered 2015-09-02: 5 mg via ORAL
  Filled 2015-09-02: qty 1

## 2015-09-02 MED ORDER — TRAMADOL HCL 50 MG PO TABS
100.0000 mg | ORAL_TABLET | Freq: Four times a day (QID) | ORAL | Status: DC | PRN
  Administered 2015-09-02 – 2015-09-07 (×14): 100 mg via ORAL
  Filled 2015-09-02 (×14): qty 2

## 2015-09-02 MED ORDER — TRAMADOL HCL 50 MG PO TABS
100.0000 mg | ORAL_TABLET | Freq: Four times a day (QID) | ORAL | Status: DC | PRN
  Administered 2015-09-02 (×2): 100 mg via ORAL
  Filled 2015-09-02: qty 2

## 2015-09-02 MED ORDER — ALUM & MAG HYDROXIDE-SIMETH 200-200-20 MG/5ML PO SUSP
30.0000 mL | ORAL | Status: DC | PRN
Start: 2015-09-02 — End: 2015-09-07

## 2015-09-02 MED ORDER — HYDROXYZINE HCL 25 MG PO TABS
25.0000 mg | ORAL_TABLET | Freq: Three times a day (TID) | ORAL | Status: DC | PRN
Start: 2015-09-02 — End: 2015-09-07
  Administered 2015-09-02 – 2015-09-06 (×5): 25 mg via ORAL
  Filled 2015-09-02 (×5): qty 1

## 2015-09-02 NOTE — ED Notes (Signed)
Patient transferred to [facility].  Left the unit in a wheelchair and was escorted out to the [facility].  Patient was given a dose of Tramadol prior to leaving the unit.  Report given to [facility].

## 2015-09-02 NOTE — BH Assessment (Signed)
[facility] Assessment Progress Note  Per Carolanne Grumbling, MD, this pt requires psychiatric hospitalization at this time.  Rosey Bath, RN, [facility] has assigned pt to [facility] Rm 400-2, however, other patients need to be moved out of this room and it will then need to be cleaned.  She will call when they are ready to receive the pt.  Pt has signed Voluntary Admission and Consent for Treatment, as well as Consent to Release Information to her PCP, and signed forms have been faxed to [facility].  Pt's nurse, Rudean Hitt, has been notified, and agrees to send original paperwork along with pt via J[facility]row, and to call report to [phone].  Doylene Canning, MA Triage Specialist [phone]
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
## 2015-09-02 NOTE — ED Notes (Signed)
Spoke with Amy from [facility] for possible placement for this patient. Amy stated she would get back with concerning placement.

## 2015-09-02 NOTE — Progress Notes (Signed)
Patient accepted to [facility], room 400-2. Rosey Bath, RN

## 2015-09-02 NOTE — Progress Notes (Signed)
Patient ID: Tara Brown, female   DOB: 10/09/1960, 54 y.o.   MRN: [id number] Patient was admitted to [facility] due to increased depression and SI due to decreased ability to care for self.  Pateint endorses PDA with no plan to commit suicide while in the hospital.  Patient was able to complete admission and acknowledge the acceptance of treatment.  Patient contracted for safety.

## 2015-09-02 NOTE — Progress Notes (Signed)
Pt participated in a game for wrap-up group.  

## 2015-09-02 NOTE — BH Assessment (Signed)
BHH Assessment Progress Note   Amy from [facility] called back to say that they are declining patient due to concerns over eating d/o.  They cannot address this type of diagnosis, so they are declining due to medical condition.

## 2015-09-02 NOTE — Tx Team (Signed)
Initial Interdisciplinary Treatment Plan   PATIENT STRESSORS Health problems     PATIENT STRENGTHS: Ability for insight Supportive family/friends   PROBLEM LIST: Problem List/Patient Goals Date to be addressed Date deferred Reason deferred Estimated date of resolution  "I was going to overdose on pills:      "I see no reason for living"                                                 DISCHARGE CRITERIA:  Adequate post-discharge living arrangements Improved stabilization in mood, thinking, and/or behavior Safe-care adequate arrangements made  PRELIMINARY DISCHARGE PLAN: Attend aftercare/continuing care group  PATIENT/FAMIILY INVOLVEMENT: This treatment plan has been presented to and reviewed with the patient, Tara Brown, and/or family member, The patient and family have been given the opportunity to ask questions and make suggestions.  Jerrye Bushy 09/02/2015, 7:33 PM

## 2015-09-03 ENCOUNTER — Inpatient Hospital Stay (HOSPITAL_COMMUNITY): Payer: Medicare Other

## 2015-09-03 ENCOUNTER — Encounter (HOSPITAL_COMMUNITY): Payer: Self-pay | Admitting: Psychiatry

## 2015-09-03 DIAGNOSIS — F332 Major depressive disorder, recurrent severe without psychotic features: Principal | ICD-10-CM

## 2015-09-03 DIAGNOSIS — R45851 Suicidal ideations: Secondary | ICD-10-CM

## 2015-09-03 LAB — CBC WITH DIFFERENTIAL/PLATELET
BASOS ABS: 0 10*3/uL (ref 0.0–0.1)
Basophils Relative: 0 %
Eosinophils Absolute: 0.1 10*3/uL (ref 0.0–0.7)
Eosinophils Relative: 1 %
HEMATOCRIT: 24.5 % — AB (ref 36.0–46.0)
HEMOGLOBIN: 7.3 g/dL — AB (ref 12.0–15.0)
LYMPHS PCT: 38 %
Lymphs Abs: 1.7 10*3/uL (ref 0.7–4.0)
MCH: 22.4 pg — ABNORMAL LOW (ref 26.0–34.0)
MCHC: 29.8 g/dL — ABNORMAL LOW (ref 30.0–36.0)
MCV: 75.2 fL — AB (ref 78.0–100.0)
Monocytes Absolute: 0.4 10*3/uL (ref 0.1–1.0)
Monocytes Relative: 10 %
NEUTROS ABS: 2.3 10*3/uL (ref 1.7–7.7)
Neutrophils Relative %: 51 %
Platelets: 334 10*3/uL (ref 150–400)
RBC: 3.26 MIL/uL — AB (ref 3.87–5.11)
RDW: 18 % — ABNORMAL HIGH (ref 11.5–15.5)
WBC: 4.6 10*3/uL (ref 4.0–10.5)

## 2015-09-03 LAB — SEDIMENTATION RATE: Sed Rate: 116 mm/hr — ABNORMAL HIGH (ref 0–22)

## 2015-09-03 MED ORDER — INFLUENZA VAC SPLIT QUAD 0.5 ML IM SUSY
0.5000 mL | PREFILLED_SYRINGE | INTRAMUSCULAR | Status: DC
  Filled 2015-09-03: qty 0.5

## 2015-09-03 MED ORDER — KETOROLAC TROMETHAMINE 60 MG/2ML IM SOLN
60.0000 mg | Freq: Once | INTRAMUSCULAR | Status: DC
  Filled 2015-09-03: qty 2

## 2015-09-03 MED ORDER — ENSURE ENLIVE PO LIQD
237.0000 mL | Freq: Two times a day (BID) | ORAL | Status: DC
  Administered 2015-09-03 – 2015-09-07 (×6): 237 mL via ORAL

## 2015-09-03 MED ORDER — MELOXICAM 7.5 MG PO TABS
7.5000 mg | ORAL_TABLET | Freq: Every day | ORAL | Status: DC
  Administered 2015-09-04: 7.5 mg via ORAL
  Filled 2015-09-03 (×2): qty 1

## 2015-09-03 MED ORDER — SERTRALINE HCL 25 MG PO TABS
25.0000 mg | ORAL_TABLET | Freq: Every day | ORAL | Status: DC
  Administered 2015-09-03 – 2015-09-04 (×2): 25 mg via ORAL
  Filled 2015-09-03 (×4): qty 1

## 2015-09-03 MED ORDER — ZOLPIDEM TARTRATE 10 MG PO TABS
10.0000 mg | ORAL_TABLET | Freq: Every evening | ORAL | Status: DC | PRN
  Administered 2015-09-03 – 2015-09-06 (×4): 10 mg via ORAL
  Filled 2015-09-03 (×4): qty 1

## 2015-09-03 NOTE — BHH Group Notes (Signed)
BHH LCSW Group Therapy 09/03/2015 1:15pm  Type of Therapy: Group Therapy- Feelings Around Relapse and Recovery  Pt did not attend, declined invitation.    Chad Cordial, Theresia Majors [phone] 09/03/2015 2:12 PM

## 2015-09-03 NOTE — Progress Notes (Signed)
NUTRITION ASSESSMENT  Pt identified as at risk on the Malnutrition Screen Tool  INTERVENTION: 1. Educated patient on the importance of nutrition and encouraged intake of food and beverages. 2. Discussed weight goals. 3. Supplements: will order Ensure Enlive po BID, each supplement provides 350 kcal and 20 grams of protein  NUTRITION DIAGNOSIS: Unintentional weight loss related to sub-optimal intake as evidenced by pt report.   Goal: Pt to meet >/= 90% of their estimated nutrition needs.  Monitor:  PO intake  Assessment:  Pt seen for MST. Pt admitted for worsening depression. She developed an infection in July. Appetite has been decreased over the past few months with associated weight loss. Per chart review, pt has lost 23 lbs (18% body weight) in the past 6 months which is significant for time frame.  Will order Ensure Enlive BID to assist. Notes indicate pt attended wrap-up group last night; d/c is likely to occur very soon.  54 y.o. female  Height: Ht Readings from Last 1 Encounters:  09/02/15 5' 4.5" (1.638 m)    Weight: Wt Readings from Last 1 Encounters:  09/02/15 102 lb (46.267 kg)    Weight Hx: Wt Readings from Last 10 Encounters:  09/02/15 102 lb (46.267 kg)  08/31/15 101 lb (45.813 kg)  08/09/15 104 lb (47.174 kg)  08/07/15 101 lb 4.8 oz (45.949 kg)  03/26/15 119 lb (53.978 kg)  03/26/15 125 lb (56.7 kg)  03/11/15 125 lb (56.7 kg)  03/08/15 129 lb 6.6 oz (58.7 kg)  10/07/13 170 lb (77.111 kg)    BMI:  Body mass index is 17.24 kg/(m^2). Pt meets criteria for underweight based on current BMI.  Estimated Nutritional Needs: Kcal: 25-30 kcal/kg Protein: > 1 gram protein/kg Fluid: 1 ml/kcal  Diet Order: Diet regular Room service appropriate?: Yes; Fluid consistency:: Thin Pt is also offered choice of unit snacks mid-morning and mid-afternoon.  Pt is eating as desired.   Lab results and medications reviewed.       [name] Gammon, RD,  LDN Inpatient Clinical Dietitian Pager # [phone] After hours/weekend pager # [phone]

## 2015-09-03 NOTE — Progress Notes (Signed)
Adult Psychoeducational Group Note  Date:  09/03/2015 Time:  2015  Group Topic/Focus:  Wrap-Up Group:   The focus of this group is to help patients review their daily goal of treatment and discuss progress on daily workbooks.  Participation Level:  Did Not Attend  Additional Comments:  Pt did not attend group due to being at [facility] for an MRI.  Samir Ishaq Chanel 09/03/2015, 9:57 PM

## 2015-09-03 NOTE — BHH Suicide Risk Assessment (Signed)
[facility] Admission Suicide Risk Assessment   Nursing information obtained from:    Demographic factors:    Current Mental Status:    Loss Factors:    Historical Factors:    Risk Reduction Factors:    Total Time spent with patient: 30 minutes Principal Problem: Major depressive disorder, recurrent, severe without psychotic features (HCC) Diagnosis:   Patient Active Problem List   Diagnosis Date Noted  . Encounter for preadmission testing [Z01.818]   . Stage III chronic kidney disease [N18.3] 08/13/2015  . Penetrating foot wound [S91.339A] 08/12/2015  . Left foot infection [L08.9] 08/12/2015  . Osteomyelitis (HCC) [M86.9]   . Hypokalemia [E87.6]   . Anemia, iron deficiency [D50.9]   . Essential hypertension [I10]   . Psoriatic arthritis (HCC) [L40.50] 03/11/2015  . Osteoporosis [M81.0] 03/11/2015  . Major depressive disorder, recurrent, severe without psychotic features (HCC) [F33.2] 03/11/2015  . Anemia of chronic disease [D63.8] 03/08/2015     Continued Clinical Symptoms:  Alcohol Use Disorder Identification Test Final Score (AUDIT): 0 The "Alcohol Use Disorders Identification Test", Guidelines for Use in Primary Care, Second Edition.  World Science writer [facility]). Score between 0-7:  no or low risk or alcohol related problems. Score between 8-15:  moderate risk of alcohol related problems. Score between 16-19:  high risk of alcohol related problems. Score 20 or above:  warrants further diagnostic evaluation for alcohol dependence and treatment.   CLINICAL FACTORS:   Depression:   Hopelessness Medical Diagnoses and Treatments/Surgeries   Musculoskeletal: Strength & Muscle Tone: within normal limits Gait & Station: normal Patient leans: N/A  Psychiatric Specialty Exam: Physical Exam  Musculoskeletal:  Left foot - draining wound - redness and swelling and pus drainage    Review of Systems  Skin:       Wound left foot- red and swollen   Psychiatric/Behavioral:  Positive for depression.  All other systems reviewed and are negative.   Blood pressure 107/74, pulse 83, temperature 97.8 F (36.6 C), temperature source Oral, resp. rate 16, height 5' 4.5" (1.638 m), weight 46.267 kg (102 lb).Body mass index is 17.24 kg/(m^2).  General Appearance: Fairly Groomed  Patent attorney::  Fair  Speech:  Normal Rate  Volume:  Decreased  Mood:  Depressed  Affect:  Appropriate  Thought Process:  Goal Directed  Orientation:  Full (Time, Place, and Person)  Thought Content:  Rumination  Suicidal Thoughts:  No  Homicidal Thoughts:  No  Memory:  Immediate;   Fair Recent;   Fair Remote;   Fair  Judgement:  Impaired  Insight:  Fair  Psychomotor Activity:  Decreased  Concentration:  Fair  Recall:  [employer] of Knowledge:Fair  Language: Fair  Akathisia:  No  Handed:  Right  AIMS (if indicated):     Assets:  Communication Skills Desire for Improvement  Sleep:  Number of Hours: 4  Cognition: WNL  ADL's:  Intact     COGNITIVE FEATURES THAT CONTRIBUTE TO RISK:  Closed-mindedness, Polarized thinking and Thought constriction (tunnel vision)    SUICIDE RISK:   Mild:  Suicidal ideation of limited frequency, intensity, duration, and specificity.  There are no identifiable plans, no associated intent, mild dysphoria and related symptoms, good self-control (both objective and subjective assessment), few other risk factors, and identifiable protective factors, including available and accessible social support.  PLAN OF CARE: Patient will benefit from inpatient treatment and stabilization.  Estimated length of stay is 5-7 days.  Reviewed past medical records,treatment plan.  Will DC Lexapro for  lack of efficacy. Will start Zoloft 25 mg po daily for affective sx. Will add Ambien 10 mg po qhs for sleep- pt has had good effect from it in the past. Will add Vistaril 25 mg po q6h prn for anxiety sx. Will place wound consult as well as order wound culture. Discussed plan  with conrad NP who will follow up on this. Reviewed Xray done in ED- of left foot- pt has had this for a long time.  Will continue to monitor vitals ,medication compliance and treatment side effects while patient is here.  Will monitor for medical issues as well as call consult as needed.  Reviewed labs ,will order tsh, wound culture, cbc with diff. CSW will start working on disposition.  Patient to participate in therapeutic milieu .       Medical Decision Making:  Review of Psycho-Social Stressors (1), Review or order clinical lab tests (1), Decision to obtain old records (1), Review and summation of old records (2), Established Problem, Worsening (2), New Problem, with no additional work-up planned (3), Review or order medicine tests (1) and Review of New Medication or Change in Dosage (2)  I certify that inpatient services furnished can reasonably be expected to improve the patient's condition.   Aubree Doody MD 09/03/2015, 2:33 PM

## 2015-09-03 NOTE — Tx Team (Signed)
Interdisciplinary Treatment Plan Update (Adult) Date: 09/03/2015   Date: 09/03/2015 11:19 AM  Progress in Treatment:  Attending groups: Yes  Participating in groups: Yes  Taking medication as prescribed: Yes  Tolerating medication: Yes  Family/Significant othe contact made: No, CSW assessing for appropriate contacts Patient understands diagnosis: Yes AEB seeking help with depression Discussing patient identified problems/goals with staff: Yes  Medical problems stabilized or resolved: Yes  Denies suicidal/homicidal ideation: Yes Patient has not harmed self or Others: Yes   New problem(s) identified: None identified at this time.   Discharge Plan or Barriers: Home with in-home aid v. ALF placement.   Additional comments:  Patient and CSW reviewed pt's identified goals and treatment plan. Patient verbalized understanding and agreed to treatment plan. CSW reviewed [facility] "Discharge Process and Patient Involvement" Form. Pt verbalized understanding of information provided and signed form.   Reason for Continuation of Hospitalization:  Anxiety Depression Medication stabilization Suicidal ideation  Estimated length of stay: 3-5 days  Review of initial/current patient goals per problem list:   1.  Goal(s): Patient will participate in aftercare plan  Met:  No  Target date: 3-5 days from date of admission   As evidenced by: Patient will participate within aftercare plan AEB aftercare provider and housing plan at discharge being identified.   09/03/15: CSW is assessing for appropriate plan. Home with in-home aid v. ALF placement. Pt will need referral for outpatient services  2.  Goal (s): Patient will exhibit decreased depressive symptoms and suicidal ideations.  Met:  No  Target date: 3-5 days from date of admission   As evidenced by: Patient will utilize self rating of depression at 3 or below and demonstrate decreased signs of depression or be deemed stable for discharge by  MD. 09/03/15: Pt was admitted with symptoms of depression, rating 6/10. Pt continues to present with flat affect and depressive symptoms.  Pt will demonstrate decreased symptoms of depression and rate depression at 3/10 or lower prior to discharge.  3.  Goal(s): Patient will demonstrate decreased signs and symptoms of anxiety.  Met:  No  Target date: 3-5 days from date of admission   As evidenced by: Patient will utilize self rating of anxiety at 3 or below and demonstrated decreased signs of anxiety, or be deemed stable for discharge by MD  09/03/15: Pt rates anxiety at 6/10, presents with withdrawn demeanor  Attendees:  Patient:    Family:    Physician: Dr. Shea Evans, MD  09/03/2015 11:19 AM  Nursing: Lars Pinks, RN Case manager  09/03/2015 11:19 AM  Clinical Social Worker Peri Maris, [place] 09/03/2015 11:19 AM  Other: Erasmo Downer Drinkard, LCSWA 09/03/2015 11:19 AM  Clinical: Idell Pickles, RN 09/03/2015 11:19 AM  Other: , RN Charge Nurse 09/03/2015 11:19 AM  Other: Hilda Lias, [place], [place] Work [phone]

## 2015-09-03 NOTE — Consult Note (Signed)
WOC wound consult note Reason for Consult:  Right great toe wound Wound type: infected hardware, old surgical site Pressure Ulcer POA:No Measurement: 0.3 cm x 0.3cm x 0.2cm plantar surface; clean, pink  anterior surface of the right great toe 0.1cm x 0.1cm x 0.2cm -this wound has exposed hardware Wound bed:  Drainage (amount, consistency, odor) thick, yellow but I feel this is typical for her.  I marked the redness on her foot. To make sure we do not see any changes.  Periwound: intact, with some erythema  Dressing procedure/placement/frequency: Cover the areas with silver hydrofiber, top with dressing, wrap with conform. Change every other day.  Discussed POC with patient and bedside nurse.  Re consult if needed, will not follow at this time. Thanks  Tondalaya Perren [employer], CWOCN [phone])

## 2015-09-03 NOTE — Progress Notes (Signed)
Patient ID: Tara Brown, female   DOB: 1961-05-18, 54 y.o.   MRN: [id number] Per State regulations 482.30 this chart was reviewed for medical necessity with respect to the patient's admission/duration of stay.    Next review date: 09/06/15  Thurman Coyer, BSN, RN-BC  Case Manager

## 2015-09-03 NOTE — Progress Notes (Signed)
Discussed case with NP. Would add MRI of affected area to assess for chronic osteo. WBC and afebrile status is reassuring. Should patient have an active infection please consult for formal evaluation and further recommendations.  Inri Sobieski, [employer]

## 2015-09-03 NOTE — Progress Notes (Signed)
Patient ID: Tara Brown, female   DOB: Oct 11, 1960, 54 y.o.   MRN: [id number] D: Client is visible on the floor, reports increased depression with not being able to care for self. Client also reports on admission to SI, but currently denies, noting she feels safe here. Client has scabbed sore on great toe plantar and dorsal area, slightly red but no tenderness reported, bandage applied for protection. A: Writer provided emotional supported, assessed complaint about foot, client revealed "a piece of metal was put in there about 30 years ago to help straightened out the toes" Staff will monitor q11min for safety. R: Client is safe on the unit, attended group.

## 2015-09-03 NOTE — BHH Group Notes (Signed)
[facility] LCSW Aftercare Discharge Planning Group Note  09/03/2015 8:45 AM  Participation Quality: Alert, Appropriate and Oriented  Mood/Affect: Flat  Depression Rating: 6  Anxiety Rating: 6  Thoughts of Suicide: Pt denies SI/HI  Will you contract for safety? Yes  Current AVH: Pt denies  Plan for Discharge/Comments: Pt attended discharge planning group and actively participated in group. CSW discussed suicide prevention education with the group and encouraged them to discuss discharge planning and any relevant barriers. Pt presents with flat affect and expresses that her depression has worsened. Pt prefers to DC home with in-home health. Considering placement in ALF.  Transportation Means: Pt reports access to transportation  Supports: No supports mentioned at this time  Chad Cordial, LCSWA 09/03/2015 9:24 AM

## 2015-09-03 NOTE — H&P (Signed)
Psychiatric Admission Assessment Adult  Patient Identification: Tara Brown MRN:  [id number] Date of Evaluation:  09/03/2015 Chief Complaint:   "I can't even take care of myself properly. I was sent home too soon." Principal Diagnosis: Major depressive disorder, recurrent, severe without psychotic features (Tara Brown) secondary to Chronic Illness  Diagnosis:   Patient Active Problem List   Diagnosis Date Noted  . Encounter for preadmission testing [Z01.818]   . Stage III chronic kidney disease [N18.3] 08/13/2015  . Penetrating foot wound [S91.339A] 08/12/2015  . Left foot infection [L08.9] 08/12/2015  . Osteomyelitis ([place]) [M86.9]   . Hypokalemia [E87.6]   . Anemia, iron deficiency [D50.9]   . Essential hypertension [I10]   . Psoriatic arthritis ([place]) [L40.50] 03/11/2015  . Osteoporosis [M81.0] 03/11/2015  . Major depressive disorder, recurrent, severe without psychotic features ([place]) [F33.2] 03/11/2015  . Anemia of chronic disease [D63.8] 03/08/2015   History of Present Illness: I have reviewed and modified HPI elements from ED, as follows, with today's current data: Tara Brown is an 54 y.o. female, Caucasian, divorced, who presents to [facility] ER with a history of depression, osteoporosis, and arthritis presents with worsening depression. She is having suicidal ideation and wants to kill herself with an overdose of medicine. Patient has chronic pain and contractures from her arthritis since she was age 72 and states there is no point living anymore. She has chronic foot problems from a prior infection, states this is not worsening but not improving either. No fevers. No new injuries or new pain. She states she is eating and drinking much less because she has no appetite. She has been losing weight for months.  Patient acknowledges current SI with plan [unspecified, but acknowledges means at home], and history of depression. Patient denies substance abuse history.  Patient denies current or past history of AVH. Patient acknowledges past history of inpatient psychiatric care with most recent in 2016 at [facility] for depression. Patient denies current or past history of outpatient psychiatric care. Patient reports not eating and losing up to 70 lbs. In past months, and reports sleeping up to 3 hours per night.   Patient is dressed in scrubs and is alert and oriented x4. Patient speech was within normal limits and motor behavior appeared normal. Patient thought process is coherent. Patient does not appear to be responding to internal stimuli. Patient was cooperative throughout the assessment and states that she is agreeable to inpatient psychiatric treatment.  Today, on 09/03/2015, pt seen and chart reviewed for H&P. Pt seen and chart reviewed. Pt is alert/oriented x4, calm, cooperative, and appropriate to situation. Pt denies homicidal ideation and psychosis and does not appear to be responding to internal stimuli. Pt reports that she has been feeling suicidal secondary to chronic health problems including 2 small (less than dime-size) ulcers to the distal portion of her left foot near the great phalanx (superior and inferior) with scant, non-malodorous, mucopurulent drainage, NTTP, mild erythema without surrounding tissue involvement (will circle to monitor for cellulitis), and denies pain with this. Pt reports that she has a plate here from "years and years ago" to correct degenerative joint changes due to autoimmune arthritic factors. DG Left foot was performed in the ED: negative for soft tissue gas, negative for fracture, positive for severe arthropathy at first MTP joint with resorption of bone adjacent to orthopedic hardware, positive for diffuse osteopenia with degenerative changes.   Pt reports increasing depression over the past few months secondary to difficulty in performing ADL's  and managing her finances in addition to the above medical concerns. Pt was here  on 08/09/15, then transferred to the medical floor for evaluation on 08/12/15, then discharged home. Pt reports that she was not given follow-up for counseling/psychiatry and that she "left too soon." Pt would like medication management and titration in addition to adjustment of her sleep medications.   Associated Signs/Symptoms: Depression Symptoms:  depressed mood, anhedonia, insomnia, recurrent thoughts of death, loss of energy/fatigue, weight loss, decreased appetite, decreased sense of self esteem (Hypo) Manic Symptoms:  denies  Anxiety Symptoms:  Anxious rumination about difficulties in caring for herself for ADL's Psychotic Symptoms:  denies  PTSD Symptoms: denies  Total Time spent with patient: 45 minutes   Past Psychiatric History: this is her second psychiatric admission, has not attempted suicide in the past, denies history of self cutting , denies history of psychosis, denies history of mania. States she has been on Lexapro for a period of time, denies side effects. She was here on 08/09/15, sent to medical floor on 08/12/15, then sent home without known follow-up.   Risk to Self: Is patient at risk for suicide?: No Risk to Others:   Prior Inpatient Therapy:   Prior Outpatient Therapy:    Alcohol Screening: 1. How often do you have a drink containing alcohol?: Never 9. Have you or someone else been injured as a result of your drinking?: No 10. Has a relative or friend or a doctor or another health worker been concerned about your drinking or suggested you cut down?: No Alcohol Use Disorder Identification Test Final Score (AUDIT): 0 Brief Intervention: AUDIT score less than 7 or less-screening does not suggest unhealthy drinking-brief intervention not indicated Substance Abuse History in the last 12 months:   Denies  Consequences of Substance Abuse:  denies  Previous Psychotropic Medications: has been on Lexapro for a period of months to years, and Ambien 66m at  home Psychological Evaluations: No  Past Medical History: on Keflex for a UTI. 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 Of note, had surgery to correct toe deviation/deformity at that site years ago, as per her report.  * Of note, patient has been on Ultram 100 mgrs Q 6 hours for years,(  in combination with Lexapro over a period of months to years) - she denies any seizures , denies any symptoms of serotonin syndrome, denies any other side effects. [facility] then sent on 08/12/15 to the hospital floor, then discharged without psychiatric follow-up, [facility] Pt and staff are concerned about the reported lack of referrals and support given to the patient during her medical inpatient stay and this may be contributory to her sub-30 day readmission.   Past Medical History  Diagnosis Date  . Osteoporosis   . Psoriatic arthritis ([place]   . Anemia   . Depression   . Major depressive disorder, recurrent, severe without psychotic features ([place] 03/11/2015    Past Surgical History  Procedure Laterality Date  . Hip fracture surgery    . Knee surgery    . Foot surgery    . Hand surgery    . Neck surgery     Family History:  Mother deceased, father alive , has younger sisters, states father may be alcoholic, does not endorse mental illness in family, but a nephew committed suicide . Family History  Problem Relation Age of Onset  . Stroke Neg Hx  Family Psychiatric  History:see above . Social History:  Patient is divorced, has no children, is on disability, lives alone . No legal issues. As noted, major stressor at this time is worsening ability to function independently due to progression of her rheumatologic disease . History  Alcohol Use No     History  Drug Use No    Social History   Social History  . Marital Status: Divorced    Spouse Name: N/A  . Number of Children: N/A  . Years of Education: N/A   Social  History Main Topics  . Smoking status: Never Smoker   . Smokeless tobacco: Never Used  . Alcohol Use: No  . Drug Use: No  . Sexual Activity: No   Other Topics Concern  . None   Social History Narrative   Additional Social History:  Allergies:  No Known Allergies Lab Results: No results found for this or any previous visit (from the past 48 hour(s)).  Metabolic Disorder Labs:  No results found for: HGBA1C, MPG No results found for: PROLACTIN No results found for: CHOL, TRIG, HDL, CHOLHDL, VLDL, LDLCALC  Current Medications: Current Facility-Administered Medications  Medication Dose Route Frequency Provider Last Rate Last Dose  . acetaminophen (TYLENOL) tablet 650 mg  650 mg Oral Q6H PRN Harriet Butte, NP      . alum & mag hydroxide-simeth (MAALOX/MYLANTA) 200-200-20 MG/5ML suspension 30 mL  30 mL Oral Q4H PRN Harriet Butte, NP      . escitalopram (LEXAPRO) tablet 20 mg  20 mg Oral Daily Harriet Butte, NP   20 mg at 09/03/15 0824  . feeding supplement (ENSURE ENLIVE) (ENSURE ENLIVE) liquid 237 mL  237 mL Oral BID BM Maricela Bo [place], RD   237 mL at 09/03/15 1116  . hydrOXYzine (ATARAX/VISTARIL) tablet 25 mg  25 mg Oral TID PRN Harriet Butte, NP   25 mg at 09/02/15 2127  . lisinopril (PRINIVIL,ZESTRIL) tablet 40 mg  40 mg Oral Daily Harriet Butte, NP   40 mg at 09/03/15 2878  . magnesium hydroxide (MILK OF MAGNESIA) suspension 30 mL  30 mL Oral Daily PRN Harriet Butte, NP      . traMADol (ULTRAM) tablet 100 mg  100 mg Oral Q6H PRN Harriet Butte, NP   100 mg at 09/03/15 0631  . zolpidem (AMBIEN) tablet 5 mg  5 mg Oral QHS PRN Harriet Butte, NP   5 mg at 09/02/15 2127   PTA Medications: Prescriptions prior to admission  Medication Sig Dispense Refill Last Dose  . Cholecalciferol (VITAMIN D PO) Take 1 capsule by mouth daily.   Past Month at Unknown time  . escitalopram (LEXAPRO) 20 MG tablet Take 1 tablet (20 mg total) by mouth daily. 30 tablet 0 Past Month at  Unknown time  . ferrous sulfate 325 (65 FE) MG tablet Take 325 mg by mouth daily with breakfast.   Past Month at Unknown time  . folic acid (FOLVITE) 1 MG tablet Take 1 mg by mouth daily.   Past Month at Unknown time  . lisinopril (PRINIVIL,ZESTRIL) 40 MG tablet Take 40 mg by mouth daily.   Past Month at Unknown time  . mirtazapine (REMERON SOL-TAB) 15 MG disintegrating tablet Take 1 tablet (15 mg total) by mouth at bedtime. 30 tablet 0   . traMADol (ULTRAM) 50 MG tablet Take 100 mg by mouth every 6 (six) hours as needed for moderate pain.    08/31/2015 at 1200  .  zolpidem (AMBIEN) 10 MG tablet Take 1 tablet (10 mg total) by mouth at bedtime as needed for sleep. 30 tablet 0 Past Month at Unknown time    Musculoskeletal: Strength & Muscle Tone: within normal limits- impaired range of motion related to chronic arthritis  Gait & Station: Mild change in gait to account for mild edema in left foot Patient leans: N/A  Psychiatric Specialty Exam: Physical Exam  Review of Systems  Constitutional: Positive for weight loss and malaise/fatigue.  HENT: Negative.   Eyes: Negative.   Respiratory: Negative.   Cardiovascular: Negative.   Gastrointestinal: Negative for nausea and vomiting.  Genitourinary: Negative.   Musculoskeletal: Positive for joint pain.       Chronic arthritis affecting multiple joints    Skin: Negative.   Neurological: Negative for seizures.  Endo/Heme/Allergies: Negative.   Psychiatric/Behavioral: Positive for depression and suicidal ideas (although minimizing). Negative for hallucinations and substance abuse. The patient is nervous/anxious and has insomnia.     Blood pressure 107/74, pulse 83, temperature 97.8 F (36.6 C), temperature source Oral, resp. rate 16, height 5' 4.5" (1.638 m), weight 46.267 kg (102 lb).Body mass index is 17.24 kg/(m^2).  General Appearance: Fairly Groomed  Engineer, water::  Good  Speech:  Normal Rate  Volume:  Normal  Mood:  Depressed  Affect:   Appropriate, Congruent and Depressed  Thought Process:  Coherent, Goal Directed and Linear  Orientation:  Full (Time, Place, and Person)  Thought Content:  no hallucinations, no delusions, ruminative about decline in functional level related to her chronic illness   Suicidal Thoughts:  Yes, although minimizing  Homicidal Thoughts:  No  Memory:  Immediate;   Fair Recent;   Fair Remote;   Fair  Judgement:  Fair  Insight:  Present  Psychomotor Activity:  Normal-  Concentration:  Good  Recall:  Good  Fund of Knowledge:Good  Language: Good  Akathisia:  Negative  Handed:  Right  AIMS (if indicated):     Assets:  Communication Skills Resilience  ADL's:   Fair   Cognition: WNL  Sleep:  Number of Hours: 4    Treatment Plan Summary: Daily contact with patient to assess and evaluate symptoms and progress in treatment, Medication management, Plan inpatient admission and medications as below   Medications: -Discontinue Lexapro -Discontinue Remeron -Modify Ambien to 46m qhs prn insomnia -Start Zoloft 216mdaily for MDD (consider titration to 5069mf tolerated) -Continue Vistaril 46m66md prn anxiety -Continue Lisinopril 40mg9mly for HTN  Labs/Tests/Consults:  -Reviewed UDS (negative) CBC (unremarkable), CMP (unremarkable), ESR (elevated), CRP (elevated, 12.3), DG Left foot (warrants close monitoring, no fracture, no gas, no osteomyelitis, will continue to follow and monitor as below) -ESR (repeat)  -CRP (repeat)  -CBC w/diff (repeat) -MRI (left foot) -Wound consult (left foot) -Wound culture (left foot) -Hospitalist consult (to follow the left foot)  Observation Level/Precautions:  15 minute checks  Laboratory:  Labs resulted, reviewed, and stable at this time.   Psychotherapy:  Group therapy, individual therapy, psychoeducation  Medications:  See MAR above  Consultations: None    Discharge Concerns: None    Estimated LOS: 5-7 days  Other:  N/A    I certify that  inpatient services furnished can reasonably be expected to improve the patient's condition.    Alane Hanssen C, FNP-BC 12/23/201611:18 AM

## 2015-09-03 NOTE — Progress Notes (Addendum)
Patient ID: Tara Brown, female   DOB: 09/09/1961, 54 y.o.   MRN: [id number] D: Client returns from [facility] after having an MRI completed, no complaints verbalized. Client visible in dayroom watching TV. A: Medications reviewed and administered, no side effects noted. Staff will monitor q52min for safety. R: client is safe on the unit.

## 2015-09-04 DIAGNOSIS — S91339S Puncture wound without foreign body, unspecified foot, sequela: Secondary | ICD-10-CM

## 2015-09-04 LAB — TSH: TSH: 2.08 u[IU]/mL (ref 0.350–4.500)

## 2015-09-04 MED ORDER — MELOXICAM 15 MG PO TABS
15.0000 mg | ORAL_TABLET | Freq: Every day | ORAL | Status: DC
  Administered 2015-09-05 – 2015-09-06 (×2): 15 mg via ORAL
  Filled 2015-09-04 (×4): qty 1
  Filled 2015-09-04: qty 2

## 2015-09-04 MED ORDER — SERTRALINE HCL 50 MG PO TABS
50.0000 mg | ORAL_TABLET | Freq: Every day | ORAL | Status: DC
  Administered 2015-09-05 – 2015-09-06 (×2): 50 mg via ORAL
  Filled 2015-09-04 (×4): qty 1

## 2015-09-04 MED ORDER — KETOROLAC TROMETHAMINE 60 MG/2ML IM SOLN
60.0000 mg | Freq: Once | INTRAMUSCULAR | Status: DC
  Filled 2015-09-04: qty 2

## 2015-09-04 MED ORDER — LIDOCAINE 5 % EX PTCH
2.0000 | MEDICATED_PATCH | Freq: Every day | CUTANEOUS | Status: DC
  Administered 2015-09-04 – 2015-09-07 (×4): 2 via TRANSDERMAL
  Filled 2015-09-04 (×7): qty 2

## 2015-09-04 MED ORDER — SERTRALINE HCL 25 MG PO TABS
25.0000 mg | ORAL_TABLET | Freq: Once | ORAL | Status: AC
  Administered 2015-09-04: 25 mg via ORAL
  Filled 2015-09-04: qty 1

## 2015-09-04 MED ORDER — KETOROLAC TROMETHAMINE 60 MG/2ML IM SOLN
60.0000 mg | Freq: Once | INTRAMUSCULAR | Status: AC
  Administered 2015-09-04: 60 mg via INTRAMUSCULAR
  Filled 2015-09-04: qty 2

## 2015-09-04 NOTE — [facility] Group Notes (Signed)
[facility] Group Notes: (Clinical Social Work)   09/04/2015      Type of Therapy:  Group Therapy   Participation Level:  Did Not Attend despite MHT prompting   Ambrose Mantle, LCSW 09/04/2015, 12:27 PM

## 2015-09-04 NOTE — Progress Notes (Signed)
D: Patient met lying down in her room awake. Patient stated "I am good. I just want to rest". Denies pain, SI, AH/VH at this time. Made no new complaint. Cooperative and compliant with her medications and treatment plans.  A: Offered support and encouragement as needed. Due medications given as ordered. Every 15 minutes check for safety maintained. Will continue to monitor patient for safety and stability.  R: Patient remains safe.

## 2015-09-04 NOTE — Progress Notes (Signed)
Pt visible in the milieu. Interacting appropriately with staff and peers. Pt's concern verbalized was talking with MD about medications.  Pt also requested medication informational sheets.  Needs assessed. Pt denied. Support and encouragement provided. Pt receptive. Fifteen minute checks in progress for patient safety. Pt safe on unit.

## 2015-09-04 NOTE — Consult Note (Signed)
  54 year old female with past medical history of hypertension, depression, has had hardware placed for fracture in ankle. She has followed with podiatrist in [place] which we know from her last hospitalization to [facility] and she was told her hardware needs to get out. Pt needs to follow up with podiatry and/or surgery to evaluate the prosthesis. No convincing evidence of osteomyelitis on MRI so would defer abx treatment for now until she is seen outpt.  Manson Passey [facility] [phone]

## 2015-09-04 NOTE — Progress Notes (Signed)
Patient ID: Tara Brown, female   DOB: [birth year], 54 y.o.   MRN: [id number] [facility] MD Progress Note  09/04/2015  Donnajean Chesnut  MRN:  [id number] Subjective: Pt states: "I'm feeling better but still a little depressed. I'm glad my foot MRI wasn't too concerning.  Objective: Pt seen and chart reviewed. Pt is alert/oriented x4, calm, cooperative, and appropriate to situation. Pt denies suicidal/homicidal ideation and psychosis and does not appear to be responding to internal stimuli. Pt reports that she continues to feel depressed although slightly better today. She reports an improvement in her sleep wth the increase in Ambien. Denies any activating features from the Zoloft.    Principal Problem: Major depressive disorder, recurrent, severe without psychotic features ([place]) Diagnosis:   Patient Active Problem List   Diagnosis Date Noted  . Encounter for preadmission testing [Z01.818]   . Stage III chronic kidney disease [N18.3] 08/13/2015  . Penetrating foot wound [S91.339A] 08/12/2015  . Left foot infection [L08.9] 08/12/2015  . Osteomyelitis ([place]) [M86.9]   . Hypokalemia [E87.6]   . Anemia, iron deficiency [D50.9]   . Essential hypertension [I10]   . Psoriatic arthritis ([place]) [L40.50] 03/11/2015  . Osteoporosis [M81.0] 03/11/2015  . Major depressive disorder, recurrent, severe without psychotic features ([place]) [F33.2] 03/11/2015  . Anemia of chronic disease [D63.8] 03/08/2015   Total Time spent with patient: 15 minutes   Past Medical History:  Past Medical History  Diagnosis Date  . Osteoporosis   . Psoriatic arthritis ([place])   . Anemia   . Depression   . Major depressive disorder, recurrent, severe without psychotic features ([place]) 03/11/2015    Past Surgical History  Procedure Laterality Date  . Hip fracture surgery    . Knee surgery    . Foot surgery    . Hand surgery    . Neck surgery     Family History:  Family History  Problem Relation Age of Onset  . Stroke Neg  Hx     Social History:  History  Alcohol Use No     History  Drug Use No    Social History   Social History  . Marital Status: Divorced    Spouse Name: N/A  . Number of Children: N/A  . Years of Education: N/A   Social History Main Topics  . Smoking status: Never Smoker   . Smokeless tobacco: Never Used  . Alcohol Use: No  . Drug Use: No  . Sexual Activity: No   Other Topics Concern  . None   Social History Narrative   Additional Social History:   Sleep: improved   Appetite:  Fair  Current Medications: Current Facility-Administered Medications  Medication Dose Route Frequency Provider Last Rate Last Dose  . acetaminophen (TYLENOL) tablet 650 mg  650 mg Oral Q6H PRN Harriet Butte, NP      . alum & mag hydroxide-simeth (MAALOX/MYLANTA) 200-200-20 MG/5ML suspension 30 mL  30 mL Oral Q4H PRN Harriet Butte, NP      . feeding supplement (ENSURE ENLIVE) (ENSURE ENLIVE) liquid 237 mL  237 mL Oral BID BM Maricela Bo Ostheim, RD   237 mL at 09/03/15 1439  . hydrOXYzine (ATARAX/VISTARIL) tablet 25 mg  25 mg Oral TID PRN Harriet Butte, NP   25 mg at 09/02/15 2127  . Influenza vac split quadrivalent PF (FLUARIX) injection 0.5 mL  0.5 mL Intramuscular Tomorrow-1000 Fernando A Cobos, MD      . ketorolac (TORADOL) injection 60 mg  60 mg Intramuscular  Once Benjamine Mola, FNP   60 mg at 09/03/15 1930  . lidocaine (LIDODERM) 5 % 2 patch  2 patch Transdermal Daily Benjamine Mola, FNP      . lisinopril (PRINIVIL,ZESTRIL) tablet 40 mg  40 mg Oral Daily Harriet Butte, NP   40 mg at 09/03/15 8295  . magnesium hydroxide (MILK OF MAGNESIA) suspension 30 mL  30 mL Oral Daily PRN Harriet Butte, NP      . meloxicam (MOBIC) tablet 7.5 mg  7.5 mg Oral Daily Benjamine Mola, FNP   7.5 mg at 09/04/15 0839  . sertraline (ZOLOFT) tablet 25 mg  25 mg Oral Once Benjamine Mola, FNP      . [START ON 09/05/2015] sertraline (ZOLOFT) tablet 50 mg  50 mg Oral Daily Benjamine Mola, FNP      . traMADol  Veatrice Bourbon) tablet 100 mg  100 mg Oral Q6H PRN Harriet Butte, NP   100 mg at 09/04/15 6213  . zolpidem (AMBIEN) tablet 10 mg  10 mg Oral QHS PRN Ursula Alert, MD   10 mg at 09/03/15 2127    Lab Results:  Results for orders placed or performed during the hospital encounter of 09/02/15 (from the past 48 hour(s))  CBC with Differential/Platelet     Status: Abnormal   Collection Time: 09/03/15  6:08 PM  Result Value Ref Range   WBC 4.6 4.0 - 10.5 K/uL   RBC 3.26 (L) 3.87 - 5.11 MIL/uL   Hemoglobin 7.3 (L) 12.0 - 15.0 g/dL   HCT 24.5 (L) 36.0 - 46.0 %   MCV 75.2 (L) 78.0 - 100.0 fL   MCH 22.4 (L) 26.0 - 34.0 pg   MCHC 29.8 (L) 30.0 - 36.0 g/dL   RDW 18.0 (H) 11.5 - 15.5 %   Platelets 334 150 - 400 K/uL   Neutrophils Relative % 51 %   Neutro Abs 2.3 1.7 - 7.7 K/uL   Lymphocytes Relative 38 %   Lymphs Abs 1.7 0.7 - 4.0 K/uL   Monocytes Relative 10 %   Monocytes Absolute 0.4 0.1 - 1.0 K/uL   Eosinophils Relative 1 %   Eosinophils Absolute 0.1 0.0 - 0.7 K/uL   Basophils Relative 0 %   Basophils Absolute 0.0 0.0 - 0.1 K/uL    Comment: Performed at [facility]  Sedimentation rate     Status: Abnormal   Collection Time: 09/03/15  6:08 PM  Result Value Ref Range   Sed Rate 116 (H) 0 - 22 mm/hr    Comment: Performed at [facility]  TSH     Status: None   Collection Time: 09/04/15  6:17 AM  Result Value Ref Range   TSH 2.080 0.350 - 4.500 uIU/mL    Comment: Performed at [facility]    Physical Findings: AIMS: Facial and Oral Movements Muscles of Facial Expression: None, normal Lips and Perioral Area: None, normal Jaw: None, normal Tongue: None, normal,Extremity Movements Upper (arms, wrists, hands, fingers): None, normal Lower (legs, knees, ankles, toes): None, normal, Trunk Movements Neck, shoulders, hips: None, normal, Overall Severity Severity of abnormal movements (highest score from questions above): None,  normal Incapacitation due to abnormal movements: None, normal Patient's awareness of abnormal movements (rate only patient's report): No Awareness, Dental Status Current problems with teeth and/or dentures?: No Does patient usually wear dentures?: No  CIWA:    COWS:     Musculoskeletal: Strength & Muscle Tone: within normal  limits- range of motion  Affected by arthritis  Gait & Station: normal Patient leans: N/A  Psychiatric Specialty Exam: Review of Systems  Musculoskeletal: Positive for myalgias, back pain and joint pain.  Psychiatric/Behavioral: Positive for depression. Negative for suicidal ideas, hallucinations and substance abuse. The patient is nervous/anxious and has insomnia.   All other systems reviewed and are negative.  chronic joint pain related to chronic arthritis, chronic ulceration with drainage and erythema on L toe, no fever, no chills   Blood pressure 84/55, pulse 86, temperature 98 F (36.7 C), temperature source Oral, resp. rate 16, height 5' 4.5" (1.638 m), weight 46.267 kg (102 lb).Body mass index is 17.24 kg/(m^2).  General Appearance: Fairly Groomed  Engineer, water::  Good  Speech:  Normal Rate  Volume:  Normal  Mood:  Depressed, but improving  Affect:  Congruent, depressed  Thought Process:  Linear  Orientation:  Other:  fully alert and attentive   Thought Content:  no hallucinations, no delusions, not internally preoccupied   Suicidal Thoughts:  No denies plan or intention of hurting self or of SI  Homicidal Thoughts:  No  Memory:  recent and remote grossly intact   Judgement:  Other:  improving  Insight:  Present  Psychomotor Activity:  Good, yet hindered by arthritis  Concentration:  Good  Recall:  good  Fund of Knowledge:Good  Language: Good  Akathisia:  Negative  Handed:  Right  AIMS (if indicated):     Assets:  Communication Skills Desire for Improvement Resilience  ADL's:   Fair   Cognition: WNL  Sleep:  Number of Hours: 6   Treatment  Plan Summary: Daily contact with patient to assess and evaluate symptoms and progress in treatment, Medication management, Plan inpatient admission and medications as below  Medications: -Continue Ambien to 5m qhs prn insomnia -Increase Zoloft to 548mdaily for MDD -Continue Vistaril 2524mid prn anxiety -Continue Lisinopril 68m42mily for HTN -Mobic 7.5mg 68mly for chronic pain (this morning -Toradol 60mg 32m 1 dose (for breakthrough pain, once, last night)  Labs/Tests/Consults:  -Reviewed UDS (negative) CBC (unremarkable), CMP (unremarkable), ESR (elevated), CRP (elevated, 12.3), DG Left foot (warrants close monitoring, no fracture, no gas, no osteomyelitis, will continue to follow and monitor as below) -ESR (repeat) (increased slightly, yet afebrile and WBC WNL) -CRP (repeat 09/04/15) -CBC w/diff (repeat) (MCV very low, suspicious for iron-deficiency anemia, will order Iron and TIBC  -TSH (WNL) -MRI (left foot), ruled out abscess, states "unlikely for osteomyelitis", no pockets of fluid; will continue to monitor for febrile state, increase in WBC, or spreading erythema or tachycardia -Wound consult (left foot) PENDING -Wound culture (left foot) PENDING   WithroBenjamine MolaBC 09/04/2015, 11:22 AM

## 2015-09-04 NOTE — Plan of Care (Signed)
Problem: Ineffective individual coping Goal: STG: Patient will remain free from self harm Outcome: Progressing Client is safe on the unit AEB q36min safety checks, medication compliance and no reports of self harm.

## 2015-09-05 LAB — IRON AND TIBC
IRON: 16 ug/dL — AB (ref 28–170)
Saturation Ratios: 8 % — ABNORMAL LOW (ref 10.4–31.8)
TIBC: 204 ug/dL — ABNORMAL LOW (ref 250–450)
UIBC: 188 ug/dL

## 2015-09-05 LAB — C-REACTIVE PROTEIN: CRP: 12.4 mg/dL — AB (ref <1.0)

## 2015-09-05 MED ORDER — PREDNISONE 10 MG PO TABS
10.0000 mg | ORAL_TABLET | Freq: Every day | ORAL | Status: DC
Start: 2015-09-09 — End: 2015-09-07
  Filled 2015-09-05: qty 1

## 2015-09-05 MED ORDER — PREDNISONE 5 MG (21) PO TBPK: 5.0000 mg | ORAL_TABLET | Freq: Three times a day (TID) | ORAL | Status: DC

## 2015-09-05 MED ORDER — PREDNISONE 5 MG PO TABS
15.0000 mg | ORAL_TABLET | Freq: Every day | ORAL | Status: DC
Start: 2015-09-08 — End: 2015-09-07
  Filled 2015-09-05: qty 3

## 2015-09-05 MED ORDER — PREDNISONE 5 MG (21) PO TBPK: 5.0000 mg | ORAL_TABLET | ORAL | Status: DC

## 2015-09-05 MED ORDER — PREDNISONE 5 MG (21) PO TBPK: 10.0000 mg | ORAL_TABLET | Freq: Every evening | ORAL | Status: DC

## 2015-09-05 MED ORDER — PREDNISONE 20 MG PO TABS
20.0000 mg | ORAL_TABLET | Freq: Every day | ORAL | Status: AC
  Administered 2015-09-07: 20 mg via ORAL
  Filled 2015-09-05: qty 1

## 2015-09-05 MED ORDER — PREDNISONE 10 MG PO TABS
30.0000 mg | ORAL_TABLET | Freq: Once | ORAL | Status: AC
  Administered 2015-09-05: 30 mg via ORAL
  Filled 2015-09-05: qty 6
  Filled 2015-09-05: qty 3

## 2015-09-05 MED ORDER — PREDNISONE 5 MG (21) PO TBPK: 5.0000 mg | ORAL_TABLET | Freq: Four times a day (QID) | ORAL | Status: DC

## 2015-09-05 MED ORDER — PREDNISONE 5 MG PO TABS
25.0000 mg | ORAL_TABLET | Freq: Every day | ORAL | Status: AC
  Administered 2015-09-06: 25 mg via ORAL
  Filled 2015-09-05: qty 5

## 2015-09-05 MED ORDER — PREDNISONE 5 MG PO TABS: 5.0000 mg | ORAL_TABLET | Freq: Every day | ORAL | Status: DC

## 2015-09-05 MED ORDER — PREDNISONE 5 MG (21) PO TBPK
10.0000 mg | ORAL_TABLET | Freq: Every morning | ORAL | Status: DC
  Filled 2015-09-05 (×2): qty 21

## 2015-09-05 MED ORDER — FERROUS SULFATE 325 (65 FE) MG PO TABS
325.0000 mg | ORAL_TABLET | Freq: Every day | ORAL | Status: DC
  Administered 2015-09-05 – 2015-09-07 (×3): 325 mg via ORAL
  Filled 2015-09-05 (×7): qty 1

## 2015-09-05 NOTE — Progress Notes (Signed)
Patient ID: Tara Brown, female   DOB: Mar 10, 1961, 54 y.o.   MRN: [id number] [facility] MD Progress Note  09/05/2015  Tara Brown  MRN:  [id number] Subjective: Pt states: "I'm feeling much better today in terms of anxiety and depression but I'm hurting so bad. I just hurt all over and I need something for pain besides narcotics."  Objective: Pt seen and chart reviewed. Pt is alert/oriented x4, calm, cooperative, and appropriate to situation. Pt denies suicidal/homicidal ideation and psychosis and does not appear to be responding to internal stimuli. Pt reports her anxiety and depression are improving but that she still feels somewhat depressed, mostly due to her chronic pain which she feels is worsening in her lower back and knees despite the lidoderm patches. Pt would like to try something non-narcotic and has responded well to prednisone in the past without activating features. Will start low-dose 5 day pak.    Principal Problem: Major depressive disorder, recurrent, severe without psychotic features ([place]) Diagnosis:   Patient Active Problem List   Diagnosis Date Noted  . Major depressive disorder, recurrent, severe without psychotic features ([place]) [F33.2] 03/11/2015    Priority: High  . Penetrating foot wound [S91.339A] 08/12/2015    Priority: Medium  . Encounter for preadmission testing [Z01.818]   . Stage III chronic kidney disease [N18.3] 08/13/2015  . Left foot infection [L08.9] 08/12/2015  . Osteomyelitis ([place]) [M86.9]   . Hypokalemia [E87.6]   . Anemia, iron deficiency [D50.9]   . Essential hypertension [I10]   . Psoriatic arthritis ([place]) [L40.50] 03/11/2015  . Osteoporosis [M81.0] 03/11/2015  . Anemia of chronic disease [D63.8] 03/08/2015   Total Time spent with patient: 15 minutes   Past Medical History:  Past Medical History  Diagnosis Date  . Osteoporosis   . Psoriatic arthritis ([place])   . Anemia   . Depression   . Major depressive disorder, recurrent, severe  without psychotic features ([place]) 03/11/2015    Past Surgical History  Procedure Laterality Date  . Hip fracture surgery    . Knee surgery    . Foot surgery    . Hand surgery    . Neck surgery     Family History:  Family History  Problem Relation Age of Onset  . Stroke Neg Hx     Social History:  History  Alcohol Use No     History  Drug Use No    Social History   Social History  . Marital Status: Divorced    Spouse Name: N/A  . Number of Children: N/A  . Years of Education: N/A   Social History Main Topics  . Smoking status: Never Smoker   . Smokeless tobacco: Never Used  . Alcohol Use: No  . Drug Use: No  . Sexual Activity: No   Other Topics Concern  . None   Social History Narrative   Additional Social History:   Sleep: Good  Appetite:  Good  Current Medications: Current Facility-Administered Medications  Medication Dose Route Frequency Provider Last Rate Last Dose  . acetaminophen (TYLENOL) tablet 650 mg  650 mg Oral Q6H PRN Harriet Butte, NP      . alum & mag hydroxide-simeth (MAALOX/MYLANTA) 200-200-20 MG/5ML suspension 30 mL  30 mL Oral Q4H PRN Harriet Butte, NP      . feeding supplement (ENSURE ENLIVE) (ENSURE ENLIVE) liquid 237 mL  237 mL Oral BID BM Maricela Bo Ostheim, RD   237 mL at 09/04/15 1333  . hydrOXYzine (ATARAX/VISTARIL) tablet 25 mg  25 mg Oral TID PRN Harriet Butte, NP   25 mg at 09/04/15 2136  . Influenza vac split quadrivalent PF (FLUARIX) injection 0.5 mL  0.5 mL Intramuscular Tomorrow-1000 Myer Peer Cobos, MD   0.5 mL at 09/04/15 1400  . lidocaine (LIDODERM) 5 % 2 patch  2 patch Transdermal Daily Benjamine Mola, FNP   2 patch at 09/05/15 [phone]  . lisinopril (PRINIVIL,ZESTRIL) tablet 40 mg  40 mg Oral Daily Harriet Butte, NP   40 mg at 09/05/15 7048  . magnesium hydroxide (MILK OF MAGNESIA) suspension 30 mL  30 mL Oral Daily PRN Harriet Butte, NP      . meloxicam (MOBIC) tablet 15 mg  15 mg Oral Daily Benjamine Mola, FNP   15  mg at 09/05/15 8891  . sertraline (ZOLOFT) tablet 50 mg  50 mg Oral Daily Benjamine Mola, FNP   50 mg at 09/05/15 6945  . traMADol (ULTRAM) tablet 100 mg  100 mg Oral Q6H PRN Harriet Butte, NP   100 mg at 09/05/15 0644  . zolpidem (AMBIEN) tablet 10 mg  10 mg Oral QHS PRN Ursula Alert, MD   10 mg at 09/04/15 2136    Lab Results:  Results for orders placed or performed during the hospital encounter of 09/02/15 (from the past 48 hour(s))  CBC with Differential/Platelet     Status: Abnormal   Collection Time: 09/03/15  6:08 PM  Result Value Ref Range   WBC 4.6 4.0 - 10.5 K/uL   RBC 3.26 (L) 3.87 - 5.11 MIL/uL   Hemoglobin 7.3 (L) 12.0 - 15.0 g/dL   HCT 24.5 (L) 36.0 - 46.0 %   MCV 75.2 (L) 78.0 - 100.0 fL   MCH 22.4 (L) 26.0 - 34.0 pg   MCHC 29.8 (L) 30.0 - 36.0 g/dL   RDW 18.0 (H) 11.5 - 15.5 %   Platelets 334 150 - 400 K/uL   Neutrophils Relative % 51 %   Neutro Abs 2.3 1.7 - 7.7 K/uL   Lymphocytes Relative 38 %   Lymphs Abs 1.7 0.7 - 4.0 K/uL   Monocytes Relative 10 %   Monocytes Absolute 0.4 0.1 - 1.0 K/uL   Eosinophils Relative 1 %   Eosinophils Absolute 0.1 0.0 - 0.7 K/uL   Basophils Relative 0 %   Basophils Absolute 0.0 0.0 - 0.1 K/uL    Comment: Performed at [facility]  Sedimentation rate     Status: Abnormal   Collection Time: 09/03/15  6:08 PM  Result Value Ref Range   Sed Rate 116 (H) 0 - 22 mm/hr    Comment: Performed at [facility]  TSH     Status: None   Collection Time: 09/04/15  6:17 AM  Result Value Ref Range   TSH 2.080 0.350 - 4.500 uIU/mL    Comment: Performed at [facility]  Iron and TIBC     Status: Abnormal   Collection Time: 09/04/15  6:30 PM  Result Value Ref Range   Iron 16 (L) 28 - 170 ug/dL   TIBC 204 (L) 250 - 450 ug/dL   Saturation Ratios 8 (L) 10.4 - 31.8 %   UIBC 188 ug/dL    Comment: Performed at [facility]  C-reactive protein     Status: Abnormal   Collection Time:  09/04/15  6:30 PM  Result Value Ref Range   CRP 12.4 (H) <1.0 mg/dL    Comment: Performed  at [facility]    Physical Findings: AIMS: Facial and Oral Movements Muscles of Facial Expression: None, normal Lips and Perioral Area: None, normal Jaw: None, normal Tongue: None, normal,Extremity Movements Upper (arms, wrists, hands, fingers): None, normal Lower (legs, knees, ankles, toes): None, normal, Trunk Movements Neck, shoulders, hips: None, normal, Overall Severity Severity of abnormal movements (highest score from questions above): None, normal Incapacitation due to abnormal movements: None, normal Patient's awareness of abnormal movements (rate only patient's report): No Awareness, Dental Status Current problems with teeth and/or dentures?: No Does patient usually wear dentures?: No  CIWA:    COWS:     Musculoskeletal: Strength & Muscle Tone: within normal limits- range of motion  Affected by arthritis  Gait & Station: normal Patient leans: N/A  Psychiatric Specialty Exam: Review of Systems  Musculoskeletal: Positive for myalgias, back pain and joint pain.  Psychiatric/Behavioral: Positive for depression. Negative for suicidal ideas, hallucinations and substance abuse. The patient is nervous/anxious and has insomnia.   All other systems reviewed and are negative.  chronic joint pain related to chronic arthritis, chronic ulceration with drainage and erythema on L toe, no fever, no chills   Blood pressure 84/55, pulse 86, temperature 98 F (36.7 C), temperature source Oral, resp. rate 16, height 5' 4.5" (1.638 m), weight 46.267 kg (102 lb).Body mass index is 17.24 kg/(m^2).  General Appearance: Fairly Groomed  Engineer, water::  Good  Speech:  Normal Rate  Volume:  Normal  Mood:  Depressed, but improving  Affect:  Congruent, depressed  Thought Process:  Linear  Orientation:  Other:  fully alert and attentive   Thought Content:  no hallucinations, no delusions, not  internally preoccupied   Suicidal Thoughts:  No denies plan or intention of hurting self or of SI  Homicidal Thoughts:  No  Memory:  recent and remote grossly intact   Judgement:  Other:  improving  Insight:  Present  Psychomotor Activity:  Good, yet hindered by arthritis  Concentration:  Good  Recall:  good  Fund of Knowledge:Good  Language: Good  Akathisia:  Negative  Handed:  Right  AIMS (if indicated):     Assets:  Communication Skills Desire for Improvement Resilience  ADL's:   Fair   Cognition: WNL  Sleep:  Number of Hours: 6   Treatment Plan Summary: Daily contact with patient to assess and evaluate symptoms and progress in treatment, Medication management, Plan inpatient admission and medications as below  Medications: -Continue Ambien to 33m qhs prn insomnia -Continue Zoloft to 523mdaily for MDD -Continue Vistaril 2563mid prn anxiety -Continue Lisinopril 48m34mily for HTN -Mobic 15mg67mly for chronic pain  -LOW-dose prednisone dose-pak for severe arthritis pain; pt would like to try this first as she has had good results in the past; denies any agitation/insomnia/anxiety from taking this long-term; will do a short dose-pak at lose dose.   Labs/Tests/Consults:  -Reviewed UDS (negative) CBC (unremarkable), CMP (unremarkable), ESR (elevated), CRP (elevated, 12.3), DG Left foot (warrants close monitoring, no fracture, no gas, no osteomyelitis, will continue to follow and monitor as below) -ESR (repeat) (increased slightly, yet afebrile and WBC WNL) -CRP (repeat 09/04/15); reviewed, remains high but stable -CBC w/diff (repeat) (MCV very low, suspicious for iron-deficiency anemia, will order Iron and TIBC  -Iron resulted very low at 16, will treat with Ferrous 325mg 55my wc -Iron TIBC also resulted as low at 204; see above; this may be contributory to her fatigue, depression, and overall low energy level -TSH (  WNL) -MRI (left foot), ruled out abscess, states  "unlikely for osteomyelitis", no pockets of fluid; will continue to monitor for febrile state, increase in WBC, or spreading erythema or tachycardia -Wound consult (left foot)  PENDING -Wound culture (left foot)  PENDING   Angelee Bahr C, FNP-BC 09/05/2015, 10:00 AM

## 2015-09-05 NOTE — Progress Notes (Signed)
Patient ID: Tara Brown, female   DOB: 10-23-1960, 54 y.o.   MRN: [id number]   D: Pt has been very flat and depressed on the unit today, pt has also been very isolative. Pt remained in her room most of the day. Pt reported that she just did not feel well. Pt requested help with bath and washing hair, MHT assisted with ADL's. Pt was seen by doctor for wound to left foot, wound culture done and sent to lab. Pt reported that her depression was a 6, her hopelessness was a 6, and her anxiety was a 2. Pt reported being negative SI/HI, no AH/VH noted. A: 15 min checks continued for patient safety. R: Pt safety maintained.

## 2015-09-06 MED ORDER — SERTRALINE HCL 25 MG PO TABS
75.0000 mg | ORAL_TABLET | Freq: Every day | ORAL | Status: DC
  Administered 2015-09-07: 75 mg via ORAL
  Filled 2015-09-06 (×3): qty 3

## 2015-09-06 NOTE — Progress Notes (Signed)
Recreation Therapy Notes  Date: 12.26.2016 Time: 9:30am  Location: [address] Dayroom   Group Topic: Stress Management  Goal Area(s) Addresses:  Patient will actively participate in stress management techniques presented during session.   Behavioral Response: Did not attend.   [name] Yarelli Decelles, LRT/CTRS         Cami Delawder L 09/06/2015 12:08 PM

## 2015-09-06 NOTE — Progress Notes (Signed)
D:  Patient's self inventory sheet, patient sleeps good, sleep medication is helpful.  Good appetite, low energy level, good concentration.  Rated depression and hopeless 6, anxiety 2.  Denied withdrawals.  Denied SI.  Physical problems, pain, all over, pain medication is helpful.  Does not have discharge plans. A:  Medications administered per MD orders.  Emotional support and encouragement given patient. R:  Denied SI and HI, contracts for safety.  Denied A/V hallucinations.  Safety maintained with 15 minute checks.

## 2015-09-06 NOTE — BHH Group Notes (Signed)
   [facility] LCSW Aftercare Discharge Planning Group Note  09/06/2015  8:45 AM   Participation Quality: Alert, Appropriate and Oriented  Mood/Affect: Depressed and flat  Depression Rating: 6  Anxiety Rating: 2  Thoughts of Suicide: Pt denies SI/HI  Will you contract for safety? Yes  Current AVH: Pt denies  Plan for Discharge/Comments: Pt attended discharge planning group and actively participated in group. CSW provided pt with today's workbook. Patient plans to return home to [facility] to follow up with outpatient services. She is interested in having home health care services come out to her home to assist her with her ADL's.   Transportation Means: Pt reports access to transportation  Supports: No supports mentioned at this time  Samuella Bruin, MSW, [employer] Clinical Social Worker [employer] [phone]

## 2015-09-06 NOTE — Progress Notes (Signed)
D   Pt is pleasant on approach and does endorse some anxiety    She is compliant with treatment and attends groups She currently denies suicidal ideation  A   Verbal support given   Medications administered and effectiveness monitored   Q 15 min checks   R   Pt safe at present

## 2015-09-06 NOTE — Progress Notes (Signed)
Adult Psychoeducational Group Note  Date:  09/06/2015 Time:  10:29 PM  Group Topic/Focus:  Wrap-Up Group:   The focus of this group is to help patients review their daily goal of treatment and discuss progress on daily workbooks.  Participation Level:  Active  Participation Quality:  Appropriate and Attentive  Affect:  Appropriate  Cognitive:  Alert, Appropriate and Oriented  Insight: Appropriate  Engagement in Group:  Engaged  Modes of Intervention:  Discussion and Education  Additional Comments:  Pt attended and participated in group. Pt stated she had a good day and has been working on her discharge planning.   Berlin Hun 09/06/2015, 10:29 PM

## 2015-09-06 NOTE — Progress Notes (Signed)
Will continue Prednisone taper, stop Mobic and place on Tramadol. Review of records indicate that no ABX have been recommended as it was already attempted in the recent past and has not helped. Will reassess wound in AM and decide if pt needs to come to [facility] for ortho to see here. BMP, CRP. ESR in AM. If trending up will likely need ortho consult in [facility].   Faye Ramsay, MD  [facility] Pager [phone]  If 7PM-7AM, please contact night-coverage [email or web address] Password [email or web address]

## 2015-09-06 NOTE — Progress Notes (Signed)
Patient ID: Tara Brown, female   DOB: 18-Jul-1961, 54 y.o.   MRN: [id number] [facility] MD Progress Note  09/06/2015  Tara Brown  MRN:  [id number] Subjective:  Patient reports partially improved mood , but still depressed . Reports ongoing physical symptoms, to include ongoing purulent discharge from toe lesion, chronic arthritic pain. Denies medication side effects. Objective: Pt seen and chart reviewed.  Patient is known to me from prior admission to unit. She has a history of severe arthritis which has caused chronic pain, joint deformity, limited range of motion, and made it progressively more difficult for her to function independently. Another chronic medical issue is L toe infection with chronic purulent drainage  and possible osteomyelitis , as well as chronic anemia.  After most recent discharge from hospital patient returned home, but states " same as before, I just can't function well without support anymore", contributing to depression. At this time states mood is improving .  I have discussed case with Hospitalist to review case- they are consulting . Denies medication side effects.   Principal Problem: Major depressive disorder, recurrent, severe without psychotic features (HCC) Diagnosis:   Patient Active Problem List   Diagnosis Date Noted  . Encounter for preadmission testing [Z01.818]   . Stage III chronic kidney disease [N18.3] 08/13/2015  . Penetrating foot wound [S91.339A] 08/12/2015  . Left foot infection [L08.9] 08/12/2015  . Osteomyelitis (HCC) [M86.9]   . Hypokalemia [E87.6]   . Anemia, iron deficiency [D50.9]   . Essential hypertension [I10]   . Psoriatic arthritis (HCC) [L40.50] 03/11/2015  . Osteoporosis [M81.0] 03/11/2015  . Major depressive disorder, recurrent, severe without psychotic features (HCC) [F33.2] 03/11/2015  . Anemia of chronic disease [D63.8] 03/08/2015   Total Time spent with patient:  25 minutes    Past Medical History:  Past Medical  History  Diagnosis Date  . Osteoporosis   . Psoriatic arthritis (HCC)   . Anemia   . Depression   . Major depressive disorder, recurrent, severe without psychotic features (HCC) 03/11/2015    Past Surgical History  Procedure Laterality Date  . Hip fracture surgery    . Knee surgery    . Foot surgery    . Hand surgery    . Neck surgery     Family History:  Family History  Problem Relation Age of Onset  . Stroke Neg Hx     Social History:  History  Alcohol Use No     History  Drug Use No    Social History   Social History  . Marital Status: Divorced    Spouse Name: N/A  . Number of Children: N/A  . Years of Education: N/A   Social History Main Topics  . Smoking status: Never Smoker   . Smokeless tobacco: Never Used  . Alcohol Use: No  . Drug Use: No  . Sexual Activity: No   Other Topics Concern  . None   Social History Narrative   Additional Social History:   Sleep: Good  Appetite:   Fair   Current Medications: Current Facility-Administered Medications  Medication Dose Route Frequency Provider Last Rate Last Dose  . acetaminophen (TYLENOL) tablet 650 mg  650 mg Oral Q6H PRN Worthy Flank, NP      . alum & mag hydroxide-simeth (MAALOX/MYLANTA) 200-200-20 MG/5ML suspension 30 mL  30 mL Oral Q4H PRN Worthy Flank, NP      . feeding supplement (ENSURE ENLIVE) (ENSURE ENLIVE) liquid 237 mL  237 mL Oral BID  BM Anderson [place], RD   237 mL at 09/06/15 1417  . ferrous sulfate tablet 325 mg  325 mg Oral Q breakfast Beau Fanny, FNP   325 mg at 09/06/15 3762  . hydrOXYzine (ATARAX/VISTARIL) tablet 25 mg  25 mg Oral TID PRN Worthy Flank, NP   25 mg at 09/06/15 1444  . Influenza vac split quadrivalent PF (FLUARIX) injection 0.5 mL  0.5 mL Intramuscular Tomorrow-1000 Rockey Situ Kyrra Prada, MD   0.5 mL at 09/04/15 1400  . lidocaine (LIDODERM) 5 % 2 patch  2 patch Transdermal Daily Beau Fanny, FNP   2 patch at 09/06/15 [phone]  . lisinopril (PRINIVIL,ZESTRIL)  tablet 40 mg  40 mg Oral Daily Worthy Flank, NP   40 mg at 09/06/15 0753  . magnesium hydroxide (MILK OF MAGNESIA) suspension 30 mL  30 mL Oral Daily PRN Worthy Flank, NP      . meloxicam (MOBIC) tablet 15 mg  15 mg Oral Daily Beau Fanny, FNP   15 mg at 09/06/15 0753  . [START ON 09/07/2015] predniSONE (DELTASONE) tablet 20 mg  20 mg Oral Daily Craige Cotta, MD       Followed by  . [START ON 09/08/2015] predniSONE (DELTASONE) tablet 15 mg  15 mg Oral Daily Craige Cotta, MD       Followed by  . [START ON 09/09/2015] predniSONE (DELTASONE) tablet 10 mg  10 mg Oral Daily Craige Cotta, MD       Followed by  . [START ON 09/10/2015] predniSONE (DELTASONE) tablet 5 mg  5 mg Oral Daily Craige Cotta, MD      . Melene Muller ON 09/07/2015] sertraline (ZOLOFT) tablet 75 mg  75 mg Oral Daily Craige Cotta, MD      . traMADol (ULTRAM) tablet 100 mg  100 mg Oral Q6H PRN Worthy Flank, NP   100 mg at 09/06/15 1441  . zolpidem (AMBIEN) tablet 10 mg  10 mg Oral QHS PRN Jomarie Longs, MD   10 mg at 09/05/15 2125    Lab Results:  Results for orders placed or performed during the hospital encounter of 09/02/15 (from the past 48 hour(s))  Iron and TIBC     Status: Abnormal   Collection Time: 09/04/15  6:30 PM  Result Value Ref Range   Iron 16 (L) 28 - 170 ug/dL   TIBC 176 (L) 160 - 737 ug/dL   Saturation Ratios 8 (L) 10.4 - 31.8 %   UIBC 188 ug/dL    Comment: Performed at [facility]  C-reactive protein     Status: Abnormal   Collection Time: 09/04/15  6:30 PM  Result Value Ref Range   CRP 12.4 (H) <1.0 mg/dL    Comment: Performed at [facility]  Wound culture     Status: None (Preliminary result)   Collection Time: 09/05/15  1:42 PM  Result Value Ref Range   Specimen Description      FOOT LEFT Performed at [facility]    Special Requests      NONE Performed at [facility]    Gram Stain PENDING    Culture       ABUNDANT STAPHYLOCOCCUS AUREUS Note: RIFAMPIN AND GENTAMICIN SHOULD NOT BE USED AS SINGLE DRUGS FOR TREATMENT OF STAPH INFECTIONS. Performed at [facility]    Report Status PENDING     Physical Findings: AIMS: Facial and Oral Movements Muscles of Facial  Expression: None, normal Lips and Perioral Area: None, normal Jaw: None, normal Tongue: None, normal,Extremity Movements Upper (arms, wrists, hands, fingers): None, normal Lower (legs, knees, ankles, toes): None, normal, Trunk Movements Neck, shoulders, hips: None, normal, Overall Severity Severity of abnormal movements (highest score from questions above): None, normal Incapacitation due to abnormal movements: None, normal Patient's awareness of abnormal movements (rate only patient's report): No Awareness, Dental Status Current problems with teeth and/or dentures?: No Does patient usually wear dentures?: No  CIWA:  CIWA-Ar Total: 1 COWS:  COWS Total Score: 2  Musculoskeletal: Strength & Muscle Tone: within normal limits- range of motion  Affected by arthritis  Gait & Station: normal Patient leans: N/A  Psychiatric Specialty Exam: Review of Systems  Musculoskeletal: Positive for myalgias, back pain and joint pain.  Psychiatric/Behavioral: Positive for depression. Negative for suicidal ideas, hallucinations and substance abuse. The patient is nervous/anxious and has insomnia.   All other systems reviewed and are negative.  chronic joint pain related to chronic arthritis, chronic ulceration with drainage and erythema on L toe, no fever, no chills   Blood pressure 114/97, pulse 88, temperature 98.2 F (36.8 C), temperature source Oral, resp. rate 18, height 5' 4.5" (1.638 m), weight 102 lb (46.267 kg).Body mass index is 17.24 kg/(m^2).  General Appearance: Fairly Groomed  Patent attorney::  Good  Speech:  Normal Rate  Volume:  Normal  Mood:   Less depressed, feeling better   Affect:  Constricted   Thought Process:  Linear   Orientation:  Other:  fully alert and attentive   Thought Content:  no hallucinations, no delusions, not internally preoccupied   Suicidal Thoughts:  No denies plan or intention of hurting self or of SI  Homicidal Thoughts:  No  Memory:  recent and remote grossly intact   Judgement:  Other:  improving  Insight:  Present  Psychomotor Activity:  Limited by chronic arthritis   Concentration:  Good  Recall:  good  Fund of Knowledge:Good  Language: Good  Akathisia:  Negative  Handed:  Right  AIMS (if indicated):     Assets:  Communication Skills Desire for Improvement Resilience  ADL's:   Fair   Cognition: WNL  Sleep:  Number of Hours: 6  Assessment - at this time patient reports improved mood , but still depressed. Denies any SI. Chronic medical issues are significant and include severe arthritis, chronic foot infection, chronic anemia.  Depression is significantly related to her medical condition and decreased ability to function independently. Tolerating Zoloft/Ambien well at this time . Treatment Plan Summary: Daily contact with patient to assess and evaluate symptoms and progress in treatment, Medication management, Plan inpatient admission and medications as below Encourage group and milieu participation to work on coping skills and symptom reduction Have reviewed case with Hospitalist, for assistance in management of chronic infection, monitoring of anemia, best management of chronic steroid management ( continue steroid taper versus continue ongoing low dose steroid dose she had been taking )  Medications: -Continue Ambien to 10mg  qhs prn insomnia -Continue Zoloft to 50mg  daily for MDD -Continue Vistaril 25mg  tid prn anxiety  Anubis Fundora, , MD  09/06/2015, 10:00 AM

## 2015-09-06 NOTE — Plan of Care (Signed)
Problem: Consults Goal: Suicide Risk Patient Education (See Patient Education module for education specifics)  Outcome: Progressing Nurse discussed suicidal thoughts/depression and coping skills with patient.        

## 2015-09-07 ENCOUNTER — Inpatient Hospital Stay (HOSPITAL_COMMUNITY)
Admission: AD | Admit: 2015-09-07 | Discharge: 2015-09-11 | DRG: 560 | Disposition: A | Discharge status: Other Acute Inpatient Hospital | Payer: Medicare Other | Source: Ambulatory Visit | Attending: Family Medicine | Admitting: Family Medicine

## 2015-09-07 ENCOUNTER — Encounter (HOSPITAL_COMMUNITY): Payer: Self-pay | Admitting: *Deleted

## 2015-09-07 DIAGNOSIS — M86672 Other chronic osteomyelitis, left ankle and foot: Secondary | ICD-10-CM | POA: Diagnosis present

## 2015-09-07 DIAGNOSIS — Z79899 Other long term (current) drug therapy: Secondary | ICD-10-CM

## 2015-09-07 DIAGNOSIS — L4050 Arthropathic psoriasis, unspecified: Secondary | ICD-10-CM | POA: Diagnosis present

## 2015-09-07 DIAGNOSIS — T8459XA Infection and inflammatory reaction due to other internal joint prosthesis, initial encounter: Principal | ICD-10-CM | POA: Diagnosis present

## 2015-09-07 DIAGNOSIS — M00072 Staphylococcal arthritis, left ankle and foot: Secondary | ICD-10-CM | POA: Diagnosis present

## 2015-09-07 DIAGNOSIS — D509 Iron deficiency anemia, unspecified: Secondary | ICD-10-CM

## 2015-09-07 DIAGNOSIS — I10 Essential (primary) hypertension: Secondary | ICD-10-CM | POA: Diagnosis not present

## 2015-09-07 DIAGNOSIS — N183 Chronic kidney disease, stage 3 unspecified: Secondary | ICD-10-CM | POA: Diagnosis present

## 2015-09-07 DIAGNOSIS — Z8249 Family history of ischemic heart disease and other diseases of the circulatory system: Secondary | ICD-10-CM

## 2015-09-07 DIAGNOSIS — B9561 Methicillin susceptible Staphylococcus aureus infection as the cause of diseases classified elsewhere: Secondary | ICD-10-CM | POA: Diagnosis not present

## 2015-09-07 DIAGNOSIS — D638 Anemia in other chronic diseases classified elsewhere: Secondary | ICD-10-CM | POA: Diagnosis not present

## 2015-09-07 DIAGNOSIS — I129 Hypertensive chronic kidney disease with stage 1 through stage 4 chronic kidney disease, or unspecified chronic kidney disease: Secondary | ICD-10-CM | POA: Diagnosis present

## 2015-09-07 DIAGNOSIS — D631 Anemia in chronic kidney disease: Secondary | ICD-10-CM | POA: Diagnosis not present

## 2015-09-07 DIAGNOSIS — R45851 Suicidal ideations: Secondary | ICD-10-CM | POA: Diagnosis not present

## 2015-09-07 DIAGNOSIS — M810 Age-related osteoporosis without current pathological fracture: Secondary | ICD-10-CM | POA: Diagnosis present

## 2015-09-07 DIAGNOSIS — F332 Major depressive disorder, recurrent severe without psychotic features: Secondary | ICD-10-CM | POA: Diagnosis present

## 2015-09-07 DIAGNOSIS — Y838 Other surgical procedures as the cause of abnormal reaction of the patient, or of later complication, without mention of misadventure at the time of the procedure: Secondary | ICD-10-CM | POA: Diagnosis present

## 2015-09-07 DIAGNOSIS — Z23 Encounter for immunization: Secondary | ICD-10-CM

## 2015-09-07 DIAGNOSIS — M79672 Pain in left foot: Secondary | ICD-10-CM | POA: Diagnosis present

## 2015-09-07 LAB — CBC
HEMATOCRIT: 25.6 % — AB (ref 36.0–46.0)
HEMATOCRIT: 24.3 % — AB (ref 36.0–46.0)
HEMOGLOBIN: 7.9 g/dL — AB (ref 12.0–15.0)
HEMOGLOBIN: 7.3 g/dL — AB (ref 12.0–15.0)
MCH: 22.6 pg — ABNORMAL LOW (ref 26.0–34.0)
MCH: 22.3 pg — AB (ref 26.0–34.0)
MCHC: 30.9 g/dL (ref 30.0–36.0)
MCHC: 30 g/dL (ref 30.0–36.0)
MCV: 73.1 fL — AB (ref 78.0–100.0)
MCV: 74.3 fL — ABNORMAL LOW (ref 78.0–100.0)
Platelets: 408 10*3/uL — ABNORMAL HIGH (ref 150–400)
Platelets: 455 10*3/uL — ABNORMAL HIGH (ref 150–400)
RBC: 3.5 MIL/uL — ABNORMAL LOW (ref 3.87–5.11)
RBC: 3.27 MIL/uL — ABNORMAL LOW (ref 3.87–5.11)
RDW: 18.2 % — AB (ref 11.5–15.5)
RDW: 18.3 % — AB (ref 11.5–15.5)
WBC: 6.6 10*3/uL (ref 4.0–10.5)
WBC: 7.6 10*3/uL (ref 4.0–10.5)

## 2015-09-07 LAB — CREATININE, SERUM
Creatinine, Ser: 1.23 mg/dL — ABNORMAL HIGH (ref 0.44–1.00)
GFR calc Af Amer: 56 mL/min — ABNORMAL LOW (ref >60)
GFR, EST NON AFRICAN AMERICAN: 48 mL/min — AB (ref >60)

## 2015-09-07 LAB — BASIC METABOLIC PANEL
ANION GAP: 9 (ref 5–15)
BUN: 30 mg/dL — ABNORMAL HIGH (ref 6–20)
CALCIUM: 8.9 mg/dL (ref 8.9–10.3)
CHLORIDE: 105 mmol/L (ref 101–111)
CO2: 26 mmol/L (ref 22–32)
Creatinine, Ser: 1.2 mg/dL — ABNORMAL HIGH (ref 0.44–1.00)
GFR calc non Af Amer: 50 mL/min — ABNORMAL LOW (ref >60)
GFR, EST AFRICAN AMERICAN: 57 mL/min — AB (ref >60)
GLUCOSE: 117 mg/dL — AB (ref 65–99)
Potassium: 4.3 mmol/L (ref 3.5–5.1)
Sodium: 140 mmol/L (ref 135–145)

## 2015-09-07 LAB — WOUND CULTURE

## 2015-09-07 LAB — C-REACTIVE PROTEIN: CRP: 7.8 mg/dL — ABNORMAL HIGH (ref <1.0)

## 2015-09-07 LAB — SEDIMENTATION RATE: SED RATE: 89 mm/h — AB (ref 0–22)

## 2015-09-07 MED ORDER — ONDANSETRON HCL 4 MG PO TABS: 4.0000 mg | ORAL_TABLET | Freq: Four times a day (QID) | ORAL | Status: DC | PRN

## 2015-09-07 MED ORDER — SODIUM CHLORIDE 0.9 % IV SOLN
INTRAVENOUS | Status: DC
Start: 2015-09-07 — End: 2015-09-09
  Administered 2015-09-07 – 2015-09-08 (×2): via INTRAVENOUS

## 2015-09-07 MED ORDER — MORPHINE SULFATE (PF) 2 MG/ML IV SOLN
1.0000 mg | INTRAVENOUS | Status: DC | PRN
  Administered 2015-09-08 – 2015-09-10 (×9): 1 mg via INTRAVENOUS
  Filled 2015-09-07 (×9): qty 1

## 2015-09-07 MED ORDER — FERROUS SULFATE 325 (65 FE) MG PO TABS
325.0000 mg | ORAL_TABLET | Freq: Every day | ORAL | Status: DC
  Administered 2015-09-08 – 2015-09-11 (×4): 325 mg via ORAL
  Filled 2015-09-07 (×6): qty 1

## 2015-09-07 MED ORDER — VANCOMYCIN HCL IN DEXTROSE 750-5 MG/150ML-% IV SOLN: 750.0000 mg | INTRAVENOUS | Status: DC

## 2015-09-07 MED ORDER — ENOXAPARIN SODIUM 40 MG/0.4ML ~~LOC~~ SOLN
40.0000 mg | SUBCUTANEOUS | Status: DC
  Administered 2015-09-07: 40 mg via SUBCUTANEOUS
  Filled 2015-09-07: qty 0.4

## 2015-09-07 MED ORDER — ONDANSETRON HCL 4 MG/2ML IJ SOLN: 4.0000 mg | Freq: Four times a day (QID) | INTRAMUSCULAR | Status: DC | PRN

## 2015-09-07 MED ORDER — LISINOPRIL 40 MG PO TABS
40.0000 mg | ORAL_TABLET | Freq: Every day | ORAL | Status: DC
  Administered 2015-09-08 – 2015-09-11 (×4): 40 mg via ORAL
  Filled 2015-09-07 (×4): qty 1

## 2015-09-07 MED ORDER — ZOLPIDEM TARTRATE 5 MG PO TABS
5.0000 mg | ORAL_TABLET | Freq: Every evening | ORAL | Status: DC | PRN
  Administered 2015-09-07 – 2015-09-11 (×4): 5 mg via ORAL
  Filled 2015-09-07 (×4): qty 1

## 2015-09-07 MED ORDER — FOLIC ACID 1 MG PO TABS
1.0000 mg | ORAL_TABLET | Freq: Every day | ORAL | Status: DC
  Administered 2015-09-07 – 2015-09-11 (×5): 1 mg via ORAL
  Filled 2015-09-07 (×5): qty 1

## 2015-09-07 MED ORDER — SERTRALINE HCL 25 MG PO TABS
75.0000 mg | ORAL_TABLET | Freq: Every day | ORAL | Status: DC
  Administered 2015-09-08 – 2015-09-11 (×4): 75 mg via ORAL
  Filled 2015-09-07 (×4): qty 1

## 2015-09-07 MED ORDER — TRAMADOL HCL 50 MG PO TABS
50.0000 mg | ORAL_TABLET | Freq: Four times a day (QID) | ORAL | Status: DC | PRN
  Administered 2015-09-07 – 2015-09-11 (×6): 50 mg via ORAL
  Filled 2015-09-07 (×6): qty 1

## 2015-09-07 MED ORDER — LORAZEPAM 0.5 MG PO TABS
0.5000 mg | ORAL_TABLET | Freq: Four times a day (QID) | ORAL | Status: DC | PRN
  Administered 2015-09-07 – 2015-09-11 (×9): 0.5 mg via ORAL
  Filled 2015-09-07 (×9): qty 1

## 2015-09-07 MED ORDER — LORAZEPAM 0.5 MG PO TABS
0.5000 mg | ORAL_TABLET | Freq: Four times a day (QID) | ORAL | Status: DC | PRN
  Administered 2015-09-07: 0.5 mg via ORAL
  Filled 2015-09-07: qty 1

## 2015-09-07 MED ORDER — VANCOMYCIN HCL IN DEXTROSE 1-5 GM/200ML-% IV SOLN
1000.0000 mg | Freq: Once | INTRAVENOUS | Status: AC
  Administered 2015-09-07: 1000 mg via INTRAVENOUS
  Filled 2015-09-07: qty 200

## 2015-09-07 MED ORDER — INFLUENZA VAC SPLIT QUAD 0.5 ML IM SUSY
0.5000 mL | PREFILLED_SYRINGE | INTRAMUSCULAR | Status: AC
  Administered 2015-09-08: 0.5 mL via INTRAMUSCULAR
  Filled 2015-09-07 (×2): qty 0.5

## 2015-09-07 NOTE — Progress Notes (Signed)
D   Pt is pleasant on approach and does endorse some anxiety    She is compliant with treatment and attends groups She currently denies suicidal ideation    Pt has a wound on her foot that has a piece of metal in it and she wanted tweezers to take it out  A   Verbal support given   Medications administered and effectiveness monitored   Q 15 min checks  Pt was instructed to leave the area alone and wait for her consult doctor to see it R   Pt safe at present

## 2015-09-07 NOTE — Progress Notes (Signed)
Patient ID: Tara [facility], female   DOB: 12-22-1960, 54 y.o.   MRN: [id number]  Patient received her belongings back from her locker and bedside. She was transported to [facility] orthopedic floor. Patient discharged from [facility] and taken by [facility] transportation to [facility]. MHT rode with patient for patient safety. Q15 minute safety checks maintained until discharge. Patient verbalized understanding of transport and left with no distress.

## 2015-09-07 NOTE — [facility] Group Notes (Signed)
[facility] Group Notes:  (Nursing/MHT/Case Management/Adjunct)  Date:  09/07/2015  Time:  11:06 AM  Type of Therapy:  Psychoeducational Skills  Participation Level:  Minimal  Participation Quality:  Inattentive  Affect:  Irritable  Cognitive:  Appropriate  Insight:  Limited  Engagement in Group:  Poor  Modes of Intervention:  Discussion and Education  Summary of Progress/Problems: Patient came to group however sat with arm folded most of the time. Appeared irritable and disengaged, did not speak.   Daun Rens E 09/07/2015, 11:06 AM

## 2015-09-07 NOTE — Plan of Care (Signed)
Problem: Diagnosis: Increased Risk For Suicide Attempt Goal: STG-Patient Will Comply With Medication Regime Outcome: Progressing Compliant at this time

## 2015-09-07 NOTE — Progress Notes (Signed)
CSW notified social work Haematologist at [employer] transfer and provided handoff of Pt needs moving forward.   Chad Cordial, LCSWA Clinical Social Work [phone]

## 2015-09-07 NOTE — Progress Notes (Signed)
ANTIBIOTIC CONSULT NOTE - INITIAL  Pharmacy Consult for vancomycin Indication:osteomylitis  No Known Allergies  Patient Measurements:   Body Weight: 46.3kg  Vital Signs: Temp: 98.3 F (36.8 C) (12/27 1700) Temp Source: Oral (12/27 1700) BP: 117/57 mmHg (12/27 1700) Pulse Rate: 79 (12/27 1700) Intake/Output from previous day:   Intake/Output from this shift:    Labs:  Recent Labs  09/07/15 0650  WBC 6.6  HGB 7.9*  PLT 408*  CREATININE 1.20*   Estimated Creatinine Clearance: 38.3 mL/min (by C-G formula based on Cr of 1.2). No results for input(s): VANCOTROUGH, VANCOPEAK, VANCORANDOM, GENTTROUGH, GENTPEAK, GENTRANDOM, TOBRATROUGH, TOBRAPEAK, TOBRARND, AMIKACINPEAK, AMIKACINTROU, AMIKACIN in the last 72 hours.   Microbiology: Recent Results (from the past 720 hour(s))  Culture, blood (routine x 2)     Status: None   Collection Time: 08/12/15 10:43 PM  Result Value Ref Range Status   Specimen Description BLOOD RIGHT ARM  Final   Special Requests BOTTLES DRAWN AEROBIC ONLY 5CC  Final   Culture   Final    NO GROWTH 5 DAYS Performed at [facility]    Report Status 08/18/2015 FINAL  Final  Culture, blood (routine x 2)     Status: None   Collection Time: 08/12/15 10:54 PM  Result Value Ref Range Status   Specimen Description BLOOD RIGHT HAND  Final   Special Requests BOTTLES DRAWN AEROBIC AND ANAEROBIC 5CC  Final   Culture   Final    NO GROWTH 5 DAYS Performed at [facility]    Report Status 08/18/2015 FINAL  Final  Urine culture     Status: None   Collection Time: 08/13/15 12:22 AM  Result Value Ref Range Status   Specimen Description URINE, RANDOM  Final   Special Requests NONE  Final   Culture   Final    NO GROWTH 1 DAY Performed at [facility]    Report Status 08/14/2015 FINAL  Final  Wound culture     Status: None   Collection Time: 09/05/15  1:42 PM  Result Value Ref Range Status   Specimen Description   Final    FOOT  LEFT Performed at [facility]    Special Requests   Final    NONE Performed at [facility]    Gram Stain   Final    FEW WBC PRESENT, PREDOMINANTLY PMN NO SQUAMOUS EPITHELIAL CELLS SEEN MODERATE GRAM POSITIVE COCCI IN PAIRS Performed at [facility]    Culture   Final    ABUNDANT STAPHYLOCOCCUS AUREUS Note: RIFAMPIN AND GENTAMICIN SHOULD NOT BE USED AS SINGLE DRUGS FOR TREATMENT OF STAPH INFECTIONS. Performed at [facility]    Report Status 09/07/2015 FINAL  Final   Organism ID, Bacteria STAPHYLOCOCCUS AUREUS  Final      Susceptibility   Staphylococcus aureus - MIC*    CLINDAMYCIN <=0.25 SENSITIVE Sensitive     ERYTHROMYCIN 0.5 SENSITIVE Sensitive     GENTAMICIN <=0.5 SENSITIVE Sensitive     LEVOFLOXACIN 0.25 SENSITIVE Sensitive     OXACILLIN 0.5 SENSITIVE Sensitive     RIFAMPIN <=0.5 SENSITIVE Sensitive     TRIMETH/SULFA <=10 SENSITIVE Sensitive     VANCOMYCIN <=0.5 SENSITIVE Sensitive     TETRACYCLINE >=16 RESISTANT Resistant     MOXIFLOXACIN <=0.25 SENSITIVE Sensitive     * ABUNDANT STAPHYLOCOCCUS AUREUS    Medical History: Past Medical History  Diagnosis Date  . Osteoporosis   . Psoriatic arthritis (HCC)   .  Anemia   . Depression   . Major depressive disorder, recurrent, severe without psychotic features (HCC) 03/11/2015    Assessment: 54 y.o. Female with a history of depression, osteoporosis, and arthritis presents with worsening depression.   Pt has wound on right great toe, has hardware in foot/ankle.  Transferring from [facility] to [facility] to treat for osteomyelitis.  Goal of Therapy:  Vancomycin trough level 15-20 mcg/ml  Plan:  Vancomycin 1gm IV x 1 then Vancomycin 750mg  IV q24h Follow renal function, LOT, vanc trough as needed  RPh 09/07/2015, 6:43 PM Pager [phone]

## 2015-09-07 NOTE — Progress Notes (Addendum)
Patient ID: Tara Brown, female   DOB: Mar 30, 1961, 54 y.o.   MRN: [id number] [facility] MD Progress Note  09/07/2015  Tyniesha Howald  MRN:  [id number] Subjective:  Patient reports ongoing depression and also describes feeling of frustration due to the chronicity of her medical issues, to include L foot infection. Denies medication side effects. Objective: Pt seen and chart reviewed. Case discussed with staff. Patient presents depressed and vaguely irritable, although polite and behavior in good control. As above, she describes significant frustration and irritability due to the chronicity of her illnesses, and the negative impact that these have had on her ability to function independently. She denies suicidal ideations. Of note, she states that " a wire is coming out of my foot", and on inspection, hardware is visible through L toe wound . As discussed with staff, patient had requested for tweezers to " pull the wire out ". I discussed this with her and she expressed understanding of the likely complications  that would arise from this and the importance of avoiding touching this lesion/wound  .  She reports she does not intend to touch wire or wound. I have discussed case with Hospitalist- appreciate hospitalist involvement - as discussed, inflammatory markers such as CRP and Sed Rate are elevated, although to a lesser degree than most recent . Denies medication side effects.   Principal Problem: Major depressive disorder, recurrent, severe without psychotic features ([place]) Diagnosis:   Patient Active Problem List   Diagnosis Date Noted  . Encounter for preadmission testing [Z01.818]   . Stage III chronic kidney disease [N18.3] 08/13/2015  . Penetrating foot wound [S91.339A] 08/12/2015  . Left foot infection [L08.9] 08/12/2015  . Osteomyelitis ([place]) [M86.9]   . Hypokalemia [E87.6]   . Anemia, iron deficiency [D50.9]   . Essential hypertension [I10]   . Psoriatic arthritis ([place]) [L40.50]  03/11/2015  . Osteoporosis [M81.0] 03/11/2015  . Major depressive disorder, recurrent, severe without psychotic features ([place]) [F33.2] 03/11/2015  . Anemia of chronic disease [D63.8] 03/08/2015   Total Time spent with patient:  25 minutes    Past Medical History:  Past Medical History  Diagnosis Date  . Osteoporosis   . Psoriatic arthritis ([place])   . Anemia   . Depression   . Major depressive disorder, recurrent, severe without psychotic features ([place]) 03/11/2015    Past Surgical History  Procedure Laterality Date  . Hip fracture surgery    . Knee surgery    . Foot surgery    . Hand surgery    . Neck surgery     Family History:  Family History  Problem Relation Age of Onset  . Stroke Neg Hx     Social History:  History  Alcohol Use No     History  Drug Use No    Social History   Social History  . Marital Status: Divorced    Spouse Name: N/A  . Number of Children: N/A  . Years of Education: N/A   Social History Main Topics  . Smoking status: Never Smoker   . Smokeless tobacco: Never Used  . Alcohol Use: No  . Drug Use: No  . Sexual Activity: No   Other Topics Concern  . None   Social History Narrative   Additional Social History:   Sleep: Good  Appetite:   Fair   Current Medications: Current Facility-Administered Medications  Medication Dose Route Frequency Provider Last Rate Last Dose  . acetaminophen (TYLENOL) tablet 650 mg  650 mg Oral Q6H PRN  Harriet Butte, NP      . alum & mag hydroxide-simeth (MAALOX/MYLANTA) 200-200-20 MG/5ML suspension 30 mL  30 mL Oral Q4H PRN Harriet Butte, NP      . feeding supplement (ENSURE ENLIVE) (ENSURE ENLIVE) liquid 237 mL  237 mL Oral BID BM Maricela Bo Ost[place], RD   237 mL at 09/07/15 0830  . ferrous sulfate tablet 325 mg  325 mg Oral Q breakfast Benjamine Mola, FNP   325 mg at 09/07/15 0810  . Influenza vac split quadrivalent PF (FLUARIX) injection 0.5 mL  0.5 mL Intramuscular Tomorrow-1000 Myer Peer Cobos,  MD   0.5 mL at 09/04/15 1400  . lidocaine (LIDODERM) 5 % 2 patch  2 patch Transdermal Daily Benjamine Mola, FNP   2 patch at 09/07/15 1257  . lisinopril (PRINIVIL,ZESTRIL) tablet 40 mg  40 mg Oral Daily Harriet Butte, NP   40 mg at 09/07/15 0810  . LORazepam (ATIVAN) tablet 0.5 mg  0.5 mg Oral Q6H PRN Jenne Campus, MD   0.5 mg at 09/07/15 1255  . magnesium hydroxide (MILK OF MAGNESIA) suspension 30 mL  30 mL Oral Daily PRN Harriet Butte, NP      . Derrill Memo ON 09/08/2015] predniSONE (DELTASONE) tablet 15 mg  15 mg Oral Daily Jenne Campus, MD       Followed by  . [START ON 09/09/2015] predniSONE (DELTASONE) tablet 10 mg  10 mg Oral Daily Jenne Campus, MD       Followed by  . [START ON 09/10/2015] predniSONE (DELTASONE) tablet 5 mg  5 mg Oral Daily Myer Peer Cobos, MD      . sertraline (ZOLOFT) tablet 75 mg  75 mg Oral Daily Jenne Campus, MD   75 mg at 09/07/15 0810  . traMADol (ULTRAM) tablet 100 mg  100 mg Oral Q6H PRN Harriet Butte, NP   100 mg at 09/07/15 1255  . zolpidem (AMBIEN) tablet 10 mg  10 mg Oral QHS PRN Ursula Alert, MD   10 mg at 09/06/15 2129    Lab Results:  Results for orders placed or performed during the hospital encounter of 09/02/15 (from the past 48 hour(s))  CBC     Status: Abnormal   Collection Time: 09/07/15  6:50 AM  Result Value Ref Range   WBC 6.6 4.0 - 10.5 K/uL   RBC 3.50 (L) 3.87 - 5.11 MIL/uL   Hemoglobin 7.9 (L) 12.0 - 15.0 g/dL   HCT 25.6 (L) 36.0 - 46.0 %   MCV 73.1 (L) 78.0 - 100.0 fL   MCH 22.6 (L) 26.0 - 34.0 pg   MCHC 30.9 30.0 - 36.0 g/dL   RDW 18.2 (H) 11.5 - 15.5 %   Platelets 408 (H) 150 - 400 K/uL    Comment: Performed at [place] metabolic panel     Status: Abnormal   Collection Time: 09/07/15  6:50 AM  Result Value Ref Range   Sodium 140 135 - 145 mmol/L   Potassium 4.3 3.5 - 5.1 mmol/L   Chloride 105 101 - 111 mmol/L   CO2 26 22 - 32 mmol/L   Glucose, Bld 117 (H) 65 - 99 mg/dL   BUN  30 (H) 6 - 20 mg/dL   Creatinine, Ser 1.20 (H) 0.44 - 1.00 mg/dL   Calcium 8.9 8.9 - 10.3 mg/dL   GFR calc non Af Amer 50 (L) >60 mL/min   GFR calc Af Amer 57 (  L) >60 mL/min    Comment: (NOTE) The eGFR has been calculated using the CKD EPI equation. This calculation has not been validated in all clinical situations. eGFR's persistently <60 mL/min signify possible Chronic Kidney Disease.    Anion gap 9 5 - 15    Comment: Performed at [facility]  C-reactive protein     Status: Abnormal   Collection Time: 09/07/15  6:50 AM  Result Value Ref Range   CRP 7.8 (H) <1.0 mg/dL    Comment: Performed at [facility]  Sedimentation rate     Status: Abnormal   Collection Time: 09/07/15  6:50 AM  Result Value Ref Range   Sed Rate 89 (H) 0 - 22 mm/hr    Comment: Performed at [facility]    Physical Findings: AIMS: Facial and Oral Movements Muscles of Facial Expression: None, normal Lips and Perioral Area: None, normal Jaw: None, normal Tongue: None, normal,Extremity Movements Upper (arms, wrists, hands, fingers): None, normal Lower (legs, knees, ankles, toes): None, normal, Trunk Movements Neck, shoulders, hips: None, normal, Overall Severity Severity of abnormal movements (highest score from questions above): None, normal Incapacitation due to abnormal movements: None, normal Patient's awareness of abnormal movements (rate only patient's report): No Awareness, Dental Status Current problems with teeth and/or dentures?: No Does patient usually wear dentures?: No  CIWA:  CIWA-Ar Total: 1 COWS:  COWS Total Score: 2  Musculoskeletal: Strength & Muscle Tone: within normal limits- range of motion  Affected by arthritis  Gait & Station: normal Patient leans: N/A  Psychiatric Specialty Exam: Review of Systems  Musculoskeletal: Positive for myalgias, back pain and joint pain.  Psychiatric/Behavioral: Positive for depression. Negative for  suicidal ideas, hallucinations and substance abuse. The patient is nervous/anxious and has insomnia.   All other systems reviewed and are negative.  chronic joint pain related to chronic arthritis, chronic ulceration with drainage and erythema on L toe, no fever, no chills   Blood pressure 110/67, pulse 83, temperature 98 F (36.7 C), temperature source Oral, resp. rate 16, height 5' 4.5" (1.638 m), weight 102 lb (46.267 kg).Body mass index is 17.24 kg/(m^2).  General Appearance: Fairly Groomed  Engineer, water::  Good  Speech:  Normal Rate  Volume:  Normal  Mood:   Depressed, but improved compared to admission  Affect:  Constricted - vaguely irritable   Thought Process:  Linear  Orientation:  Other:  fully alert and attentive   Thought Content:  no hallucinations, no delusions, not internally preoccupied   Suicidal Thoughts:  No denies plan or intention of hurting self or of SI  Homicidal Thoughts:  No  Memory:  recent and remote grossly intact   Judgement:  Other:  improving  Insight:  Present  Psychomotor Activity:  Limited by chronic arthritis   Concentration:  Good  Recall:  good  Fund of Knowledge:Good  Language: Good  Akathisia:  Negative  Handed:  Right  AIMS (if indicated):     Assets:  Communication Skills Desire for Improvement Resilience  ADL's:   Fair   Cognition: WNL  Sleep:  Number of Hours: 5.5  Assessment - patient remains depressed and frustrated regarding her chronic medical illnesses. She is not suicidal and she does feel Zoloft is helping - denies medication side effects. Exposed hardware on L toe wound , and elevated inflammatory markers . Marland Kitchen Treatment Plan Summary: Daily contact with patient to assess and evaluate symptoms and progress in treatment, Medication management, Plan inpatient admission and  medications as below Encourage group and milieu participation to work on coping skills and symptom reduction As discussed with Hospitalist, transfer to [place] bed warranted for appropriate management of L toe lesion. Patient aware and in agreement with transfer . Medications: -Continue Ambien to 37m qhs prn insomnia -Continue Zoloft  Now at 75 mg daily for MDD -Continue Vistaril 27mtid prn anxiety  COBOS, FEFelicita GageMD  09/07/2015, 10:00 AM

## 2015-09-07 NOTE — Progress Notes (Signed)
Patient ID: Tara Brown, female   DOB: 1961/08/03, 54 y.o.   MRN: [id number]  Report called for room 1512, RN Receiving Daisy. [facility] transport called and transportation is on the way.

## 2015-09-07 NOTE — H&P (Addendum)
[facility] History and Physical  Tara Brown EXH:[id number] DOB: 1961-02-06 DOA: 09/07/2015  Referring physici Dr. Parke Poisson - psychiatry  PCP: Hermelinda Medicus, MD  Chief Complaint: Possible foot infection   HPI:  54 y.o. 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 Patient reports that she had hardware placed to left first MTP joint 30 years ago. She developed infection in that joint in July and she has been followed by Dr. Claiborne Billings (podiatrist) in [facility]. Her most recent MRI did not show clear evidence of osteomyelitis but concern was that she has exposed hardware requiring orthopedic  surgery evaluation. Pt reports pain in the left foot to be controlled. She is able to bear weight on this foot. No associated fevers or chills.  Patient was hemodynamically stable on the admission. Blood work demonstrated a hemoglobin of 7.9 and creatinine of 1.2. She was started on empiric vancomycin.  Assessment & Plan    Principal problem:  Left foot infection in 1st MTP joint in the setting of presence of hardware worrisome for osteomyelitis - Has had recent MRI of the foot to rule out osteomyelitis and no clear evidence there is an osteomyelitis - Will consult ortho in am for input - Will start empiric vanco until pt seen by ortho  Active problems:  Stage III chronic kidney disease - Creatinine stable.  Severe episode of recurrent major depressive disorder, without psychotic features ([place]) and suicidal ideation - Admitted to [facility]  - Transferred to [facility] for evaluation of possible osteomyelitis - Continue Zoloft   Anemia, iron deficiency / Anemia of chronic renal failure stage 3 - Hgb 7.3 (baseline 7.7) - Continue iron supplementation.  Essential hypertension: - Continue lisinopril   DVT prophylaxis:  -  SCD's bilaterally   Radiological Exams on Admission: No results found.  Code Status: Full Family Communication: Plan of care discussed with the patient  Disposition Plan: Admit for further evaluation  Leisa Lenz, MD  [facility] Pager [phone]  Time spent in minutes: 55 minutes  Review of Systems:  Constitutional: Negative for fever, chills and malaise/fatigue. Negative for diaphoresis.  HENT: Negative for hearing loss, ear pain, nosebleeds, congestion, sore throat, neck pain, tinnitus and ear discharge.   Eyes: Negative for blurred vision, double vision, photophobia, pain, discharge and redness.  Respiratory: Negative for cough, hemoptysis, sputum production, shortness of breath, wheezing and stridor.   Cardiovascular: Negative for chest pain, palpitations, orthopnea, claudication and leg swelling.  Gastrointestinal: Negative for nausea, vomiting and abdominal pain. Negative for heartburn, constipation, blood in stool and melena.  Genitourinary: Negative for dysuria, urgency, frequency, hematuria and flank pain.  Musculoskeletal: Negative for myalgias, back pain, joint pain and falls.  Skin: per HPI Neurological: Negative for dizziness and weakness. Negative for tingling, tremors, sensory change, speech change, focal weakness, loss of consciousness and headaches.  Endo/Heme/Allergies: Negative for environmental allergies and polydipsia. Does not bruise/bleed easily.  Psychiatric/Behavioral: Negative for suicidal ideas. The patient is not nervous/anxious.      Past Medical History  Diagnosis Date  . Osteoporosis   . Psoriatic arthritis ([place])   . Anemia   . Depression   . Major depressive disorder, recurrent, severe without psychotic features ([place]) 03/11/2015   Past Surgical History  Procedure Laterality Date  . Hip fracture surgery    . Knee surgery    . Foot surgery    .  Hand surgery    . Neck surgery     Social History:  reports that she has never smoked. She  has never used smokeless tobacco. She reports that she drinks alcohol. She reports that she does not use illicit drugs.  No Known Allergies  Family History:  Family History  Problem Relation Age of Onset  . Hypertension  Mother       Prior to Admission medications   Medication Sig Start Date End Date Taking? Authorizing Provider  Cholecalciferol (VITAMIN D PO) Take 1 capsule by mouth daily.   Yes Historical Provider, MD  CIMZIA PREFILLED 2 X 200 MG/ML KIT Inject as directed every 14 (fourteen) days.  08/13/15  Yes Historical Provider, MD  ferrous sulfate 325 (65 FE) MG tablet Take 325 mg by mouth daily with breakfast.   Yes Historical Provider, MD  folic acid (FOLVITE) 1 MG tablet Take 1 mg by mouth daily.   Yes Historical Provider, MD  lisinopril (PRINIVIL,ZESTRIL) 40 MG tablet Take 40 mg by mouth daily. 02/03/15  Yes Historical Provider, MD  LORazepam (ATIVAN) 0.5 MG tablet Take 0.5 mg by mouth every 6 (six) hours as needed for anxiety.   Yes Historical Provider, MD  sertraline (ZOLOFT) 50 MG tablet Take 75 mg by mouth daily.   Yes Historical Provider, MD  traMADol (ULTRAM) 50 MG tablet Take 100 mg by mouth twice daily as needed for pain 05/06/14  Yes Historical Provider, MD  zolpidem (AMBIEN) 10 MG tablet Take 1 tablet (10 mg total) by mouth at bedtime as needed for sleep. 08/13/15  Yes Venetia Maxon Rama, MD   Physical Exam: Filed Vitals:   09/07/15 1700  BP: 117/57  Pulse: 79  Temp: 98.3 F (36.8 C)  TempSrc: Oral  Resp: 18  SpO2: 99%    Physical Exam  Constitutional: Appears well-developed and well-nourished. No distress.  HENT: Normocephalic. No tonsillar erythema or exudates Eyes: Conjunctivae are normal. No scleral icterus.  Neck: Normal ROM. Neck supple. No JVD. No tracheal deviation. No thyromegaly.  CVS: RRR, S1/S2 +, no murmurs, no gallops, no carotid bruit.  Pulmonary: Effort and breath sounds normal, no stridor, rhonchi, wheezes, rales.  Abdominal: Soft. BS +,  no  distension, tenderness, rebound or guarding.  Musculoskeletal: Normal range of motion. No edema and no tenderness. Left foot piece of hardware sticking out, no significant erythema, no active drainage from the site  Lymphadenopathy: No lymphadenopathy noted, cervical, inguinal. Neuro: Alert. Normal reflexes, muscle tone coordination. No focal neurologic deficits. Skin: Skin is warm and dry. No rash noted.  No erythema. No pallor.  Psychiatric: Normal mood and affect. Behavior, judgment, thought content normal.   Labs on Admission:  Basic Metabolic Panel:  Recent Labs Lab 09/07/15 0650  NA 140  K 4.3  CL 105  CO2 26  GLUCOSE 117*  BUN 30*  CREATININE 1.20*  CALCIUM 8.9   Liver Function Tests: No results for input(s): AST, ALT, ALKPHOS, BILITOT, PROT, ALBUMIN in the last 168 hours. No results for input(s): LIPASE, AMYLASE in the last 168 hours. No results for input(s): AMMONIA in the last 168 hours. CBC:  Recent Labs Lab 09/03/15 1808 09/07/15 0650  WBC 4.6 6.6  NEUTROABS 2.3  --   HGB 7.3* 7.9*  HCT 24.5* 25.6*  MCV 75.2* 73.1*  PLT 334 408*   Cardiac Enzymes: No results for input(s): CKTOTAL, CKMB, CKMBINDEX, TROPONINI in the last 168 hours. BNP: Invalid input(s): POCBNP CBG: No results for input(s): GLUCAP in the last  168 hours.  If 7PM-7AM, please contact night-coverage [email or web address] Password [facility] 09/07/2015, 6:06 PM

## 2015-09-07 NOTE — Progress Notes (Signed)
Patient ID: Tara Brown, female   DOB: July 13, 1961, 54 y.o.   MRN: [id number]  Writer contacted MD Myer about bed placement. Per Dr. Tyrell Antonio patient is to be transferred to [facility] for bed placement. Verbalized with read-back over telephone.

## 2015-09-08 DIAGNOSIS — T8459XA Infection and inflammatory reaction due to other internal joint prosthesis, initial encounter: Secondary | ICD-10-CM | POA: Diagnosis not present

## 2015-09-08 LAB — CBC
HEMATOCRIT: 24.2 % — AB (ref 36.0–46.0)
HEMOGLOBIN: 7.3 g/dL — AB (ref 12.0–15.0)
MCH: 22.5 pg — ABNORMAL LOW (ref 26.0–34.0)
MCHC: 30.2 g/dL (ref 30.0–36.0)
MCV: 74.5 fL — ABNORMAL LOW (ref 78.0–100.0)
Platelets: 333 10*3/uL (ref 150–400)
RBC: 3.25 MIL/uL — ABNORMAL LOW (ref 3.87–5.11)
RDW: 18.2 % — ABNORMAL HIGH (ref 11.5–15.5)
WBC: 5.9 10*3/uL (ref 4.0–10.5)

## 2015-09-08 LAB — BASIC METABOLIC PANEL
ANION GAP: 6 (ref 5–15)
BUN: 28 mg/dL — ABNORMAL HIGH (ref 6–20)
CO2: 27 mmol/L (ref 22–32)
Calcium: 8.6 mg/dL — ABNORMAL LOW (ref 8.9–10.3)
Chloride: 110 mmol/L (ref 101–111)
Creatinine, Ser: 1.07 mg/dL — ABNORMAL HIGH (ref 0.44–1.00)
GFR calc Af Amer: >60 mL/min (ref >60)
GFR, EST NON AFRICAN AMERICAN: 57 mL/min — AB (ref >60)
Glucose, Bld: 97 mg/dL (ref 65–99)
POTASSIUM: 4.4 mmol/L (ref 3.5–5.1)
SODIUM: 143 mmol/L (ref 135–145)

## 2015-09-08 MED ORDER — CEFAZOLIN SODIUM 1-5 GM-% IV SOLN
1.0000 g | Freq: Three times a day (TID) | INTRAVENOUS | Status: DC
  Administered 2015-09-08 – 2015-09-11 (×10): 1 g via INTRAVENOUS
  Filled 2015-09-08 (×11): qty 50

## 2015-09-08 NOTE — Progress Notes (Signed)
Patient ID: Tara Brown, female   DOB: 1961-05-11, 54 y.o.   MRN: [id number] [facility] PROGRESS NOTE  Tenita Cue VGK:[id number] DOB: Aug 04, 1961 DOA: 09/07/2015 PCP: Francee Gentile, MD  Brief narrative:    54 y.o. 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 Patient reports that she had hardware placed to left first MTP joint 30 years ago. She developed infection in that joint in July and she has been followed by Dr. Tresa Endo (podiatrist) in [facility]. Her most recent MRI did not show clear evidence of osteomyelitis but concern was that she has exposed hardware requiring orthopedic  surgery evaluation. Pt reports pain in the left foot to be controlled. She is able to bear weight on this foot. No associated fevers or chills.  Patient was hemodynamically stable on the admission. Blood work demonstrated a hemoglobin of 7.9 and creatinine of 1.2. She was started on empiric vancomycin.  Assessment/Plan:    Principal problem:  Left foot infection in 1st MTP joint in the setting of presence of hardware worrisome for osteomyelitis - Recent MRI of the foot to rule out osteomyelitis and no clear evidence there is an osteomyelitis - Wound culture from 12/25 showed staph aureus species - Change vanco to ancef today  - Appreciate ortho input  Active problems:  Stage III chronic kidney disease - Creatinine stable and improving since admission   Severe episode of recurrent major depressive disorder, without psychotic features ([facility]) and suicidal ideation - Continue Zoloft   Anemia, iron deficiency / Anemia of chronic renal failure stage 3 - Hgb 7.3 (baseline 7.7) - Continue iron supplementation.  Essential hypertension: - Continue lisinopril  - BP 143/71  DVT prophylaxis:  - SCD's  Code Status: Full.   Family Communication:  plan of care discussed with the patient Disposition Plan: to [facility] once eval by ortho, likely by 12/29  IV access:  Peripheral IV  Procedures and diagnostic studies:    No results found.  Medical Consultants:  Ortho  Other Consultants:  None   IAnti-Infectives:   vanco 09/07/2015 --> 09/08/2015 Ancef 09/08/2015 -->   Manson Passey, MD  [facility] Pager [phone]  Time spent in minutes: 25 minutes  If 7PM-7AM, please contact night-coverage [email or web address] Password [facility] 09/08/2015, 9:43 AM   LOS: 1 day    HPI/Subjective: No acute overnight events. Patient reports pain is controlled.  Objective: Filed Vitals:   09/07/15 1700 09/07/15 1830 09/07/15 2125 09/08/15 0522  BP: 117/57  121/79 143/71  Pulse: 79  71 59  Temp: 98.3 F (36.8 C)  98.1 F (36.7 C) 97.8 F (36.6 C)  TempSrc: Oral  Oral Oral  Resp: 18  18 18   Height:  5\' 2"  (1.575 m)    Weight:  46.267 kg (102 lb)    SpO2: 99%  98% 99%    Intake/Output Summary (Last 24 hours) at 09/08/15 0943 Last data filed at 09/08/15 0600  Gross per 24 hour  Intake 1051.25 ml  Output      0 ml  Net 1051.25 ml    Exam:   General:  Pt is alert, follows commands appropriately, not in acute distress  Cardiovascular: Regular rate and rhythm, S1/S2, no murmurs  Respiratory: Clear to auscultation bilaterally, no wheezing, no crackles, no rhonchi  Abdomen: Soft, non tender, non distended, bowel sounds present  Extremities: No edema, pulses DP  and PT palpable bilaterally; left toe exposed hardware  Neuro: Grossly nonfocal  Data Reviewed: Basic Metabolic Panel:  Recent Labs Lab 09/07/15 0650 09/07/15 1857 09/08/15 0502  NA 140  --  143  K 4.3  --  4.4  CL 105  --  110  CO2 26  --  27  GLUCOSE 117*  --  97  BUN 30*  --  28*  CREATININE 1.20* 1.23* 1.07*  CALCIUM 8.9  --  8.6*   Liver Function Tests: No results for input(s): AST, ALT, ALKPHOS, BILITOT, PROT, ALBUMIN in the  last 168 hours. No results for input(s): LIPASE, AMYLASE in the last 168 hours. No results for input(s): AMMONIA in the last 168 hours. CBC:  Recent Labs Lab 09/03/15 1808 09/07/15 0650 09/07/15 1857 09/08/15 0502  WBC 4.6 6.6 7.6 5.9  NEUTROABS 2.3  --   --   --   HGB 7.3* 7.9* 7.3* 7.3*  HCT 24.5* 25.6* 24.3* 24.2*  MCV 75.2* 73.1* 74.3* 74.5*  PLT 334 408* 455* 333   Cardiac Enzymes: No results for input(s): CKTOTAL, CKMB, CKMBINDEX, TROPONINI in the last 168 hours. BNP: Invalid input(s): POCBNP CBG: No results for input(s): GLUCAP in the last 168 hours.  Recent Results (from the past 240 hour(s))  Wound culture     Status: None   Collection Time: 09/05/15  1:42 PM  Result Value Ref Range Status   Specimen Description   Final    FOOT LEFT Performed at [facility]    Special Requests   Final    NONE Performed at [facility]    Gram Stain   Final    FEW WBC PRESENT, PREDOMINANTLY PMN NO SQUAMOUS EPITHELIAL CELLS SEEN MODERATE GRAM POSITIVE COCCI IN PAIRS Performed at [facility]    Culture   Final    ABUNDANT STAPHYLOCOCCUS AUREUS Note: RIFAMPIN AND GENTAMICIN SHOULD NOT BE USED AS SINGLE DRUGS FOR TREATMENT OF STAPH INFECTIONS. Performed at [facility]    Report Status 09/07/2015 FINAL  Final   Organism ID, Bacteria STAPHYLOCOCCUS AUREUS  Final      Susceptibility   Staphylococcus aureus - MIC*    CLINDAMYCIN <=0.25 SENSITIVE Sensitive     ERYTHROMYCIN 0.5 SENSITIVE Sensitive     GENTAMICIN <=0.5 SENSITIVE Sensitive     LEVOFLOXACIN 0.25 SENSITIVE Sensitive     OXACILLIN 0.5 SENSITIVE Sensitive     RIFAMPIN <=0.5 SENSITIVE Sensitive     TRIMETH/SULFA <=10 SENSITIVE Sensitive     VANCOMYCIN <=0.5 SENSITIVE Sensitive     TETRACYCLINE >=16 RESISTANT Resistant     MOXIFLOXACIN <=0.25 SENSITIVE Sensitive     * ABUNDANT STAPHYLOCOCCUS AUREUS     Scheduled Meds: .  ceFAZolin (ANCEF) IV  1 g  Intravenous Q8H  . ferrous sulfate  325 mg Oral Q breakfast  . folic acid  1 mg Oral Daily  . Influenza vac split quadrivalent PF  0.5 mL Intramuscular Tomorrow-1000  . lisinopril  40 mg Oral Daily  . sertraline  75 mg Oral Daily   Continuous Infusions: . sodium chloride 75 mL/hr at 09/08/15 [phone]

## 2015-09-08 NOTE — Progress Notes (Addendum)
Nutrition Brief Note  Patient identified on the Malnutrition Screening Tool (MST) Report  Wt Readings from Last 15 Encounters:  09/07/15 102 lb (46.267 kg)  09/02/15 102 lb (46.267 kg)  08/31/15 101 lb (45.813 kg)  08/09/15 104 lb (47.174 kg)  08/07/15 101 lb 4.8 oz (45.949 kg)  03/26/15 119 lb (53.978 kg)  03/26/15 125 lb (56.7 kg)  03/11/15 125 lb (56.7 kg)  03/08/15 129 lb 6.6 oz (58.7 kg)  10/07/13 170 lb (77.111 kg)    Body mass index is 18.65 kg/(m^2). Patient meets criteria for normal weight based on current BMI.   Spoke with pt at bedside. Pt was short with RD - refused NFPA, did not want any ONS. Pt reported she lost a large amount of weight PTA, "40-50#" because she wasn't eating much, sometimes at all. Pt has had issues with access previously it appears. Per chart, pt transferred from [facility]  Pt is awaiting removal of hardware from [place].  Pt stated she is eating fine now, but has caffeine and chocolate listed as an allergy - and she is not allergic to these substances. Per chart review this is not the case, but mentioned to her nurse.  Current diet order is regular, patient is consuming approximately 25% of meals at this time. Labs and medications reviewed.   No nutrition interventions warranted at this time. If nutrition issues arise, please consult RD.   Tara Ano. Seleta Hovland, MS, RD LDN After Hours/Weekend Pager [phone]

## 2015-09-08 NOTE — Care Management Note (Signed)
Case Management Note  Patient Details  Name: Tara Brown MRN: [id number] Date of Birth: 1960-12-08  Subjective/Objective:    Poss. Osteo of the foot                Action/Plan:Date: September 08, 2015 Chart reviewed for concurrent status and case management needs. Will continue to follow patient for changes and needs: Marcelle Smiling, RN, BSN, [place]   [phone]  Expected Discharge Date:   (unknown)               [facility]  [facility]  In-House Referral:  Clinical Social Work  Discharge planning Services  CM Consult  Post Acute Care Choice:  NA Choice offered to:  NA  DME Arranged:    DME Agency:     HH Arranged:    HH Agency:     Status of Service:  In process, will continue to follow  Medicare Important Message Given:    Date Medicare IM Given:    Medicare IM give by:    Date Additional Medicare IM Given:    Additional Medicare Important Message give by:     If discussed at Long Length of Stay Meetings, dates discussed:    Additional Comments:  Golda Acre, RN 09/08/2015, 12:12 PM

## 2015-09-09 DIAGNOSIS — L03032 Cellulitis of left toe: Secondary | ICD-10-CM

## 2015-09-09 MED ORDER — CEPHALEXIN 500 MG PO CAPS: 500.0000 mg | ORAL_CAPSULE | Freq: Two times a day (BID) | ORAL | Status: DC

## 2015-09-09 NOTE — Clinical Social Work Psych Note (Addendum)
09/09/15 10:30am- CSW referred pt to [facility] for outpatient follow-up. Per Dr. Elisabeth Pigeon, pt has been cleared psychiatrically and is ready for d/c home.   09/09/15- 3:30pm- Morrie Sheldon, RN informed CSW that Dr. Elisabeth Pigeon is now recommending inpatient behavioral health treatment for pt. CSW consulted with Julieanne Cotton at [facility] to see if pt will need to be evaluated by a psychiatrist prior to being accepted. AC will updated CSW ASAP. CSW will continue to follow.   Etta Quill, LCSW [phone] Hospital psychiatric & 5E, 5W 31-35 Licensed Clinical Social Worker

## 2015-09-09 NOTE — Progress Notes (Deleted)
Report called to [facility] 

## 2015-09-09 NOTE — Discharge Instructions (Signed)
Cephalexin tablets or capsules  What is this medicine?  CEPHALEXIN (sef a LEX in) is a cephalosporin antibiotic. It is used to treat certain kinds of bacterial infections It will not work for colds, flu, or other viral infections.  This medicine may be used for other purposes; ask your health care provider or pharmacist if you have questions.  What should I tell my health care provider before I take this medicine?  They need to know if you have any of these conditions:  -kidney disease  -stomach or intestine problems, especially colitis  -an unusual or allergic reaction to cephalexin, other cephalosporins, penicillins, other antibiotics, medicines, foods, dyes or preservatives  -pregnant or trying to get pregnant  -breast-feeding  How should I use this medicine?  Take this medicine by mouth with a full glass of water. Follow the directions on the prescription label. This medicine can be taken with or without food. Take your medicine at regular intervals. Do not take your medicine more often than directed. Take all of your medicine as directed even if you think you are better. Do not skip doses or stop your medicine early.  Talk to your pediatrician regarding the use of this medicine in children. While this drug may be prescribed for selected conditions, precautions do apply.  Overdosage: If you think you have taken too much of this medicine contact a poison control center or emergency room at once.  NOTE: This medicine is only for you. Do not share this medicine with others.  What if I miss a dose?  If you miss a dose, take it as soon as you can. If it is almost time for your next dose, take only that dose. Do not take double or extra doses. There should be at least 4 to 6 hours between doses.  What may interact with this medicine?  -probenecid  -some other antibiotics  This list may not describe all possible interactions. Give your health care provider a list of all the medicines, herbs, non-prescription drugs, or  dietary supplements you use. Also tell them if you smoke, drink alcohol, or use illegal drugs. Some items may interact with your medicine.  What should I watch for while using this medicine?  Tell your doctor or health care professional if your symptoms do not begin to improve in a few days.  Do not treat diarrhea with over the counter products. Contact your doctor if you have diarrhea that lasts more than 2 days or if it is severe and watery.  If you have diabetes, you may get a false-positive result for sugar in your urine. Check with your doctor or health care professional.  What side effects may I notice from receiving this medicine?  Side effects that you should report to your doctor or health care professional as soon as possible:  -allergic reactions like skin rash, itching or hives, swelling of the face, lips, or tongue  -breathing problems  -pain or trouble passing urine  -redness, blistering, peeling or loosening of the skin, including inside the mouth  -severe or watery diarrhea  -unusually weak or tired  -yellowing of the eyes, skin  Side effects that usually do not require medical attention (report to your doctor or health care professional if they continue or are bothersome):  -gas or heartburn  -genital or anal irritation  -headache  -joint or muscle pain  -nausea, vomiting  This list may not describe all possible side effects. Call your doctor for medical advice about side effects.   You may report side effects to FDA at 1-800-FDA-1088.  Where should I keep my medicine?  Keep out of the reach of children.  Store at room temperature between 59 and 86 degrees F (15 and 30 degrees C). Throw away any unused medicine after the expiration date.  NOTE: This sheet is a summary. It may not cover all possible information. If you have questions about this medicine, talk to your doctor, pharmacist, or health care provider.     © 2016, Elsevier/Gold Standard. (2007-12-02 17:09:13)

## 2015-09-09 NOTE — Discharge Summary (Signed)
Physician Discharge Summary  Tara Brown NAT:[id number] DOB: 12-02-1960 DOA: 09/07/2015  PCP: Hermelinda Medicus, MD  Admit date: 09/07/2015 Discharge date: 09/09/2015  Recommendations for Outpatient Follow-up:  Continue Keflex as prescribed Follow-up with orthopedic surgery, Dr. Sharol Given, phone number in d/csection Follow-up with podiatrist per scheduled appointment  Discharge Diagnoses:  Principal Problem:   Chronic osteomyelitis ([place]) Active Problems:   Anemia of chronic disease   Major depressive disorder, recurrent, severe without psychotic features ([place])   Anemia, iron deficiency   Essential hypertension   Stage III chronic kidney disease    Discharge Condition: stable   Diet recommendation: as tolerated   History of present illness:  54 y.o. 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 Patient reports that she had hardware placed to left first MTP joint 30 years ago. She developed infection in that joint in July and she has been followed by Dr. Claiborne Billings (podiatrist) in [facility] Her most recent MRI did not show clear evidence of osteomyelitis but concern was that she has exposed hardware requiring orthopedic surgery evaluation. Pt reports pain in the left foot to be controlled. She is able to bear weight on this foot. No associated fevers or chills.  Patient was hemodynamically stable on the admission. Blood work demonstrated a hemoglobin of 7.9 and creatinine of 1.2. She was started on empiric vancomycin which was subsequently changed to Ancef based on sensitivity report from blood cultures on 09/05/2015.   Hospital Course:   Assessment/Plan:    Principal problem:  Left foot infection in 1st MTP joint in the setting of presence of hardware worrisome for osteomyelitis - Recent MRI of the  foot to rule out osteomyelitis and no clear evidence there is an osteomyelitis - Wound culture from 12/25 showed staph aureus species - Changed vanco to ancef12/28/2016. - I spoke with Dr. Percell Miller who recommended that patient needs to follow with Dr. Sharol Given on outpatient basis. This is not an emergency procedure and does not need to be done in the hospital. - I explained this to the patient and she said she will follow-up with her podiatrist and Dr. Sharol Given once she is discharged from behavioral health - Prescription provided for Keflex for 2 weeks on discharge.  Active problems:  Stage III chronic kidney disease - Creatinine stable and improved since admission   Severe episode of recurrent major depressive disorder, without psychotic features ([place]) and suicidal ideation - Continue Zoloft   Anemia, iron deficiency / Anemia of chronic renal failure stage 3 - Hgb 7.3 (baseline 7.7) - Continue iron supplementation.  Essential hypertension: - Continue lisinopril   DVT prophylaxis:  - SCD's  Code Status: Full.  Family Communication: plan of care discussed with the patient   IV access:  Peripheral IV  Procedures and diagnostic studies:   No results found.  Medical Consultants:  Ortho  Other Consultants:  None   IAnti-Infectives:   vanco 09/07/2015 --> 09/08/2015 Ancef 09/08/2015 --> 09/09/2015   Signed:  Leisa Lenz, MD  [facility] 09/09/2015, 1:07 PM  Pager #: [phone]  Time spent in minutes: less than 30 minutes  Discharge Exam: Filed Vitals:   09/08/15 2141 09/09/15 0434  BP: 125/73 128/64  Pulse: 67 68  Temp: 98.1 F (36.7 C) 97.8 F (36.6 C)  Resp: 16 16   Filed Vitals:   09/08/15 1021 09/08/15 1351 09/08/15 2141 09/09/15  0434  BP: 128/73 138/71 125/73 128/64  Pulse:  70 67 68  Temp:  98.1 F (36.7 C) 98.1 F (36.7 C) 97.8 F (36.6 C)  TempSrc:  Oral Oral Oral  Resp:  _0 Height:      Weight:      SpO2:  100%  100% 98%    General: Pt is alert, follows commands appropriately, not in acute distress Cardiovascular: Regular rate and rhythm, S1/S2 +, no murmurs Respiratory: Clear to auscultation bilaterally, no wheezing, no crackles, no rhonchi Abdominal: Soft, non tender, non distended, bowel sounds +, no guarding Extremities: no edema, pulses palpable bilaterally DP and PT Neuro: Grossly nonfocal  Discharge Instructions  Discharge Instructions    Call MD for:  difficulty breathing, headache or visual disturbances    Complete by:  As directed      Call MD for:  persistant nausea and vomiting    Complete by:  As directed      Call MD for:  severe uncontrolled pain    Complete by:  As directed      Diet - low sodium heart healthy    Complete by:  As directed      Discharge instructions    Complete by:  As directed   Continue Keflex as prescribed Follow-up with orthopedic surgery, Dr. Sharol Given, phone number in d/csection Follow-up with podiatrist per scheduled appointment     Increase activity slowly    Complete by:  As directed             Medication List    STOP taking these medications        escitalopram 20 MG tablet  Commonly known as:  LEXAPRO     mirtazapine 15 MG disintegrating tablet  Commonly known as:  REMERON SOL-TAB      TAKE these medications        cephALEXin 500 MG capsule  Commonly known as:  KEFLEX  Take 1 capsule (500 mg total) by mouth 2 (two) times daily.     CIMZIA PREFILLED 2 X 200 MG/ML Kit  Generic drug:  Certolizumab Pegol  Inject as directed every 14 (fourteen) days.     ferrous sulfate 325 (65 FE) MG tablet  Take 325 mg by mouth daily with breakfast.     folic acid 1 MG tablet  Commonly known as:  FOLVITE  Take 1 mg by mouth daily.     lisinopril 40 MG tablet  Commonly known as:  PRINIVIL,ZESTRIL  Take 40 mg by mouth daily.     LORazepam 0.5 MG tablet  Commonly known as:  ATIVAN  Take 0.5 mg by mouth every 6 (six) hours as needed for  anxiety.     sertraline 50 MG tablet  Commonly known as:  ZOLOFT  Take 75 mg by mouth daily.     traMADol 50 MG tablet  Commonly known as:  ULTRAM  Take 100 mg by mouth twice daily as needed for pain     VITAMIN D PO  Take 1 capsule by mouth daily.     zolpidem 10 MG tablet  Commonly known as:  AMBIEN  Take 1 tablet (10 mg total) by mouth at bedtime as needed for sleep.           Follow-up Information    Schedule an appointment as soon as possible for a visit with [facility].   Specialty:  Behavioral Health   Why:  Follow-up after behavioral health hospitalization; Walk-in clinic open Monday-Friday  8:00am-3:00pm   Contact information:   [address] [place] [address] [phone]       Schedule an appointment as soon as possible for a visit with Newt Minion, MD.   Specialty:  Orthopedic Surgery   Why:  Follow up appt after recent hospitalization   Contact information:   [place] [address] [phone]        The results of significant diagnostics from this hospitalization (including imaging, microbiology, ancillary and laboratory) are listed below for reference.    Significant Diagnostic Studies: Dg Chest 2 View  09/01/2015  CLINICAL DATA:  Pre-admission testing EXAM: CHEST  2 VIEW COMPARISON:  03/07/2015 FINDINGS: Cardiomediastinal silhouette is stable. Significant thoracic dextroscoliosis again noted again noted severe fracture deformity of left proximal humerus with glenohumeral arthritis. Sclerotic changes right proximal humerus are stable. Old bilateral rib fractures. No acute infiltrate or pulmonary edema. IMPRESSION: No infiltrate or pulmonary edema. Bilateral old rib fractures are again noted. Again noted old fracture deformity of left proximal humerus with left glenohumeral arthritis. Stable sclerotic changes proximal right humerus. Severe dextroscoliosis of thoracic spine. Electronically Signed   By: Lahoma Crocker M.D.   On: 09/01/2015  12:47   Ct Foot Left W Contrast  08/11/2015  CLINICAL DATA:  Left great toe open sores and erythema for 1 week. Possible hardware rejection. Evaluate for osteomyelitis. Initial encounter. EXAM: CT OF THE LEFT FOOT WITH CONTRAST TECHNIQUE: Multidetector CT imaging was performed following the standard protocol during bolus administration of intravenous contrast. CONTRAST:  17m OMNIPAQUE IOHEXOL 300 MG/ML  SOLN COMPARISON:  None. FINDINGS: The entire foot and ankle were imaged. The bones are diffusely demineralized. There are arthropathic change throughout the forefoot status post total arthroplasty of the first metatarsal phalangeal joint. There is some bone resorption surrounding the prosthetic components within the first metatarsal head and proximal phalangeal base. In addition, there is dorsal and lateral dislocation at the first MTP joint. The interphalangeal joint of the great toe may be partially ankylosed. Advanced arthropathic changes are present at the additional metatarsal phalangeal heads with flattening. There has been possible previous resection or erosion of the second metatarsal head. There is also dorsal lateral dislocation at the second MTP joint. Milder arthropathic changes are present throughout the midfoot without subluxation at the Lisfranc joint. The subtalar and tibiotalar joints demonstrate no significant findings. There is possible chronic posttraumatic deformity of the distal fibula. There is generalized atrophy of the visualized foot and lower leg musculature. The ankle tendons appear intact. There is nonspecific soft tissue swelling in the great toe without evidence of focal fluid collection, foreign body or soft tissue emphysema. There is an effusion and probable synovial thickening at the first MTP joint. IMPRESSION: 1. Nonspecific soft tissue thickening, fusion and probable synovitis of the first MTP joint. No focal soft tissue abscess identified. 2. Diffuse arthropathic changes  throughout the forefoot status post 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 dislocated. There is bony resorption around the first MTP prosthetic components which is nonspecific, although more likely due to particle disease or the patient's underlying arthropathy than superimposed osteomyelitis. Correlation with prior radiographs would be helpful to assess the chronicity of these findings. Electronically Signed   By: WRichardean SaleM.D.   On: 08/11/2015 07:54   Mr Foot Left Wo Contrast  09/04/2015  CLINICAL DATA:  Nonhealing wound on the left foot since July, 2016. Two ulcers on the great toe. History of psoriatic arthritis.  EXAM: MRI OF THE LEFT FOREFOOT WITHOUT CONTRAST TECHNIQUE: Multiplanar, multisequence MR imaging was performed. No intravenous contrast was administered. COMPARISON:  CT of the left foot 08/10/2015. Plain films left foot 09/01/2015. FINDINGS: A prosthesis of the first MTP joint is in place and results in artifact on the examination. All imaged bones demonstrate low level edema. Multiple erosions and joint subluxations consistent with the patient's history of inflammatory arthropathy are seen as on plain films. Subcutaneous edema is identified over the dorsum of the foot. No focal fluid collection is identified. Intrinsic musculature the foot appears atrophied. No muscle or tendon tear is identified. IMPRESSION: Evaluation of the great toe is limited due to artifact from the patient's first MTP prosthesis. Low level edema in all imaged bones is likely related the patient's arthropathy and stress change. Diffuse osteomyelitis is possible but thought unlikely. Negative for abscess. Changes of chronic inflammatory arthropathy. Electronically Signed   By: Inge Rise M.D.   On: 09/04/2015 08:26   Dg Foot Complete Left  09/01/2015  CLINICAL DATA:  54 year old female with redness and swelling and drainage around the first MTP joint. EXAM: LEFT FOOT - COMPLETE 3+  VIEW COMPARISON:  CT dated 08/10/2015 FINDINGS: There is total arthroplasty of the first MTP joint with stable appearing lateral subluxation of the first and second MTP joints. There is severe arthropathy at the first MTP joint with resorption of the bone adjacent to the orthopedic hardware. There is advanced diffuse osteopenia with degenerative changes of the joints. No definite acute fracture identified. There is soft tissue swelling at the first MTP joint which may represent synovitis or superimposed infection. No soft tissue gas identified. IMPRESSION: No acute fracture. First MTP arthroplasty with stable subluxation and arthropathy. There is soft tissue swelling surrounding the first MTP joint. Clinical correlation is recommended to evaluate for synovitis or infection. No soft tissue gas identified. Electronically Signed   By: Anner Crete M.D.   On: 09/01/2015 01:27    Microbiology: Recent Results (from the past 240 hour(s))  Wound culture     Status: None   Collection Time: 09/05/15  1:42 PM  Result Value Ref Range Status   Specimen Description   Final    FOOT LEFT Performed at [facility] [place] Requests   Final    NONE Performed at [facility]    Gram Stain   Final    FEW WBC PRESENT, PREDOMINANTLY PMN NO SQUAMOUS EPITHELIAL CELLS SEEN MODERATE GRAM POSITIVE COCCI IN PAIRS Performed at [facility]    Culture   Final    ABUNDANT STAPHYLOCOCCUS AUREUS Note: RIFAMPIN AND GENTAMICIN SHOULD NOT BE USED AS SINGLE DRUGS FOR TREATMENT OF STAPH INFECTIONS. Performed at [facility]    Report Status 09/07/2015 FINAL  Final   Organism ID, Bacteria STAPHYLOCOCCUS AUREUS  Final      Susceptibility   Staphylococcus aureus - MIC*    CLINDAMYCIN <=0.25 SENSITIVE Sensitive     ERYTHROMYCIN 0.5 SENSITIVE Sensitive     GENTAMICIN <=0.5 SENSITIVE Sensitive     LEVOFLOXACIN 0.25 SENSITIVE Sensitive     OXACILLIN 0.5 SENSITIVE  Sensitive     RIFAMPIN <=0.5 SENSITIVE Sensitive     TRIMETH/SULFA <=10 SENSITIVE Sensitive     VANCOMYCIN <=0.5 SENSITIVE Sensitive     TETRACYCLINE >=16 RESISTANT Resistant     MOXIFLOXACIN <=0.25 SENSITIVE Sensitive     * ABUNDANT STAPHYLOCOCCUS AUREUS  Culture, blood (routine x 2)  Status: None (Preliminary result)   Collection Time: 09/07/15  6:43 PM  Result Value Ref Range Status   Specimen Description BLOOD RIGHT ANTECUBITAL  Final   Special Requests BOTTLES DRAWN AEROBIC AND ANAEROBIC 10CC EACH  Final   Culture   Final    NO GROWTH 2 DAYS Performed at [facility]    Report Status PENDING  Incomplete  Culture, blood (routine x 2)     Status: None (Preliminary result)   Collection Time: 09/07/15  6:53 PM  Result Value Ref Range Status   Specimen Description BLOOD LEFT ANTECUBITAL  Final   Special Requests BOTTLES DRAWN AEROBIC AND ANAEROBIC 10CC EACH  Final   Culture   Final    NO GROWTH 2 DAYS Performed at [facility]    Report Status PENDING  Incomplete  Wound culture     Status: None (Preliminary result)   Collection Time: 09/08/15  8:16 PM  Result Value Ref Range Status   Specimen Description TOE  Final   Special Requests NONE  Final   Gram Stain   Final    RARE WBC PRESENT, PREDOMINANTLY PMN NO SQUAMOUS EPITHELIAL CELLS SEEN NO ORGANISMS SEEN Performed at [facility]    Culture PENDING  Incomplete   Report Status PENDING  Incomplete     Labs: Basic Metabolic Panel:  Recent Labs Lab 09/07/15 0650 09/07/15 1857 09/08/15 0502  NA 140  --  143  K 4.3  --  4.4  CL 105  --  110  CO2 26  --  27  GLUCOSE 117*  --  97  BUN 30*  --  28*  CREATININE 1.20* 1.23* 1.07*  CALCIUM 8.9  --  8.6*   Liver Function Tests: No results for input(s): AST, ALT, ALKPHOS, BILITOT, PROT, ALBUMIN in the last 168 hours. No results for input(s): LIPASE, AMYLASE in the last 168 hours. No results for input(s): AMMONIA in the last 168  hours. CBC:  Recent Labs Lab 09/03/15 1808 09/07/15 0650 09/07/15 1857 09/08/15 0502  WBC 4.6 6.6 7.6 5.9  NEUTROABS 2.3  --   --   --   HGB 7.3* 7.9* 7.3* 7.3*  HCT 24.5* 25.6* 24.3* 24.2*  MCV 75.2* 73.1* 74.3* 74.5*  PLT 334 408* 455* 333   Cardiac Enzymes: No results for input(s): CKTOTAL, CKMB, CKMBINDEX, TROPONINI in the last 168 hours. BNP: BNP (last 3 results) No results for input(s): BNP in the last 8760 hours.  ProBNP (last 3 results) No results for input(s): PROBNP in the last 8760 hours.  CBG: No results for input(s): GLUCAP in the last 168 hours.

## 2015-09-10 DIAGNOSIS — M8668 Other chronic osteomyelitis, other site: Secondary | ICD-10-CM

## 2015-09-10 NOTE — Progress Notes (Signed)
Patient is medically stable to be discharged to behavioral health. I spoke with Dr. Jama Flavors of psychiatry over the phone 12/29 who recommended that patient returns to behavioral for further treatment of depression. At this time we are awaiting psychiatry consult. Please refer to discharge summary completed 09/09/2015. No changes in medical management since 09/09/2015. Manson Passey [facility] [phone]

## 2015-09-10 NOTE — Care Management Important Message (Signed)
Important Message  Patient Details  Name: Tara Brown MRN: [id number] Date of Birth: [birth year]   Medicare Important Message Given:  Yes    Haskell Flirt 09/10/2015, 11:52 AMImportant Message  Patient Details  Name: Tara Brown MRN: [id number] Date of Birth: 1961/02/02   Medicare Important Message Given:  Yes    Haskell Flirt 09/10/2015, 11:52 AM

## 2015-09-10 NOTE — Care Management Note (Signed)
Case Management Note  Patient Details  Name: Nare Gaspari MRN: [id number] Date of Birth: Nov 02, 1960  Subjective/Objective:                    Action/Plan:d/c IP Psych.   Expected Discharge Date:   (unknown)               [facility] Dis[facility] Plan:  [facility]  In-House Referral:  Clinical Social Work  Discharge planning Services  CM Consult  Post Acute Care Choice:  NA Choice offered to:  NA  DME Arranged:    DME Agency:     HH Arranged:    HH Agency:     Status of Service:  Completed, signed off  Medicare Important Message Given:  Yes Date Medicare IM Given:    Medicare IM give by:    Date Additional Medicare IM Given:    Additional Medicare Important Message give by:     If discussed at Long Length of Stay Meetings, dates discussed:    Additional Comments:  Lanier Clam, RN 09/10/2015, 2:26 PM

## 2015-09-10 NOTE — Clinical Social Work Note (Signed)
CSW spoke to [facility] who states pt has been calling [facility] to see when she can return. Per [facility], pt seems anxious. CSW confirmed that pt will need to be evaluated by psych prior to being discharged.    Etta Quill, LCSW [phone] Hospital psychiatric & 5E, 5W 31-35 Licensed Clinical Social Worker

## 2015-09-11 ENCOUNTER — Inpatient Hospital Stay (HOSPITAL_COMMUNITY)
Admission: AD | Admit: 2015-09-11 | Discharge: 2015-09-17 | DRG: 885 | Disposition: A | Discharge status: Home / Self Care | Payer: Medicare Other | Source: Intra-hospital | Attending: Psychiatry | Admitting: Psychiatry

## 2015-09-11 ENCOUNTER — Encounter (HOSPITAL_COMMUNITY): Payer: Self-pay | Admitting: *Deleted

## 2015-09-11 DIAGNOSIS — G4700 Insomnia, unspecified: Secondary | ICD-10-CM | POA: Diagnosis present

## 2015-09-11 DIAGNOSIS — D638 Anemia in other chronic diseases classified elsewhere: Secondary | ICD-10-CM | POA: Diagnosis present

## 2015-09-11 DIAGNOSIS — E538 Deficiency of other specified B group vitamins: Secondary | ICD-10-CM | POA: Diagnosis present

## 2015-09-11 DIAGNOSIS — D509 Iron deficiency anemia, unspecified: Secondary | ICD-10-CM | POA: Diagnosis not present

## 2015-09-11 DIAGNOSIS — F419 Anxiety disorder, unspecified: Secondary | ICD-10-CM | POA: Diagnosis present

## 2015-09-11 DIAGNOSIS — M069 Rheumatoid arthritis, unspecified: Secondary | ICD-10-CM | POA: Diagnosis present

## 2015-09-11 DIAGNOSIS — E611 Iron deficiency: Secondary | ICD-10-CM | POA: Diagnosis present

## 2015-09-11 DIAGNOSIS — M1990 Unspecified osteoarthritis, unspecified site: Secondary | ICD-10-CM | POA: Diagnosis present

## 2015-09-11 DIAGNOSIS — G8929 Other chronic pain: Secondary | ICD-10-CM | POA: Diagnosis present

## 2015-09-11 DIAGNOSIS — M86672 Other chronic osteomyelitis, left ankle and foot: Secondary | ICD-10-CM | POA: Diagnosis present

## 2015-09-11 DIAGNOSIS — R45851 Suicidal ideations: Secondary | ICD-10-CM | POA: Diagnosis present

## 2015-09-11 DIAGNOSIS — N183 Chronic kidney disease, stage 3 (moderate): Secondary | ICD-10-CM | POA: Diagnosis present

## 2015-09-11 DIAGNOSIS — I129 Hypertensive chronic kidney disease with stage 1 through stage 4 chronic kidney disease, or unspecified chronic kidney disease: Secondary | ICD-10-CM | POA: Diagnosis present

## 2015-09-11 DIAGNOSIS — F332 Major depressive disorder, recurrent severe without psychotic features: Principal | ICD-10-CM | POA: Diagnosis present

## 2015-09-11 DIAGNOSIS — I10 Essential (primary) hypertension: Secondary | ICD-10-CM | POA: Diagnosis not present

## 2015-09-11 DIAGNOSIS — K219 Gastro-esophageal reflux disease without esophagitis: Secondary | ICD-10-CM | POA: Diagnosis present

## 2015-09-11 DIAGNOSIS — L03032 Cellulitis of left toe: Secondary | ICD-10-CM | POA: Diagnosis not present

## 2015-09-11 LAB — WOUND CULTURE

## 2015-09-11 MED ORDER — HYDROXYZINE HCL 25 MG PO TABS: 25.0000 mg | ORAL_TABLET | Freq: Four times a day (QID) | ORAL | Status: DC | PRN

## 2015-09-11 MED ORDER — ZOLPIDEM TARTRATE 5 MG PO TABS
5.0000 mg | ORAL_TABLET | Freq: Every evening | ORAL | Status: DC | PRN
Start: 2015-09-11 — End: 2015-09-11

## 2015-09-11 MED ORDER — SERTRALINE HCL 50 MG PO TABS
50.0000 mg | ORAL_TABLET | Freq: Every day | ORAL | Status: DC
  Filled 2015-09-11 (×2): qty 1

## 2015-09-11 MED ORDER — MELOXICAM 15 MG PO TABS
15.0000 mg | ORAL_TABLET | Freq: Every day | ORAL | Status: DC
  Administered 2015-09-11 – 2015-09-17 (×7): 15 mg via ORAL
  Filled 2015-09-11 (×2): qty 1
  Filled 2015-09-11: qty 2
  Filled 2015-09-11 (×4): qty 1
  Filled 2015-09-11: qty 2
  Filled 2015-09-11 (×3): qty 1

## 2015-09-11 MED ORDER — TRAMADOL HCL 50 MG PO TABS
100.0000 mg | ORAL_TABLET | ORAL | Status: DC
Start: 2015-09-11 — End: 2015-09-11
  Administered 2015-09-11: 100 mg via ORAL
  Filled 2015-09-11: qty 2

## 2015-09-11 MED ORDER — FOLIC ACID 1 MG PO TABS
1.0000 mg | ORAL_TABLET | Freq: Every day | ORAL | Status: DC
  Administered 2015-09-12 – 2015-09-17 (×6): 1 mg via ORAL
  Filled 2015-09-11 (×9): qty 1

## 2015-09-11 MED ORDER — LORAZEPAM 0.5 MG PO TABS: 0.5000 mg | ORAL_TABLET | Freq: Four times a day (QID) | ORAL | Status: DC | PRN

## 2015-09-11 MED ORDER — LISINOPRIL 40 MG PO TABS
40.0000 mg | ORAL_TABLET | Freq: Every day | ORAL | Status: DC
  Administered 2015-09-12 – 2015-09-17 (×6): 40 mg via ORAL
  Filled 2015-09-11 (×9): qty 1
  Filled 2015-09-11 (×2): qty 2

## 2015-09-11 MED ORDER — ESCITALOPRAM OXALATE 20 MG PO TABS
20.0000 mg | ORAL_TABLET | Freq: Every day | ORAL | Status: DC
  Filled 2015-09-11 (×2): qty 1

## 2015-09-11 MED ORDER — LORAZEPAM 0.5 MG PO TABS
0.5000 mg | ORAL_TABLET | Freq: Four times a day (QID) | ORAL | Status: DC | PRN
  Administered 2015-09-11 – 2015-09-17 (×9): 0.5 mg via ORAL
  Filled 2015-09-11 (×10): qty 1

## 2015-09-11 MED ORDER — PREDNISONE 10 MG PO TABS
10.0000 mg | ORAL_TABLET | Freq: Every day | ORAL | Status: DC
  Filled 2015-09-11: qty 1

## 2015-09-11 MED ORDER — ZOLPIDEM TARTRATE 10 MG PO TABS
10.0000 mg | ORAL_TABLET | Freq: Every evening | ORAL | Status: DC | PRN
  Administered 2015-09-11 – 2015-09-14 (×4): 10 mg via ORAL
  Filled 2015-09-11 (×4): qty 1

## 2015-09-11 MED ORDER — CHOLECALCIFEROL 10 MCG (400 UNIT) PO TABS
400.0000 [IU] | ORAL_TABLET | Freq: Every day | ORAL | Status: DC
  Administered 2015-09-11 – 2015-09-17 (×7): 400 [IU] via ORAL
  Filled 2015-09-11 (×11): qty 1

## 2015-09-11 MED ORDER — SERTRALINE HCL 50 MG PO TABS
75.0000 mg | ORAL_TABLET | Freq: Every day | ORAL | Status: DC
  Administered 2015-09-12 – 2015-09-13 (×2): 75 mg via ORAL
  Filled 2015-09-11 (×4): qty 1

## 2015-09-11 MED ORDER — CEPHALEXIN 500 MG PO CAPS
500.0000 mg | ORAL_CAPSULE | Freq: Two times a day (BID) | ORAL | Status: DC
  Administered 2015-09-11 – 2015-09-17 (×12): 500 mg via ORAL
  Filled 2015-09-11: qty 1
  Filled 2015-09-11: qty 2
  Filled 2015-09-11 (×9): qty 1
  Filled 2015-09-11: qty 2
  Filled 2015-09-11 (×6): qty 1

## 2015-09-11 MED ORDER — FERROUS SULFATE 325 (65 FE) MG PO TABS
325.0000 mg | ORAL_TABLET | Freq: Every day | ORAL | Status: DC
  Administered 2015-09-12: 325 mg via ORAL
  Filled 2015-09-11 (×4): qty 1

## 2015-09-11 MED ORDER — TRAMADOL HCL 50 MG PO TABS
100.0000 mg | ORAL_TABLET | Freq: Four times a day (QID) | ORAL | Status: DC | PRN
  Administered 2015-09-11 – 2015-09-17 (×18): 100 mg via ORAL
  Filled 2015-09-11 (×19): qty 2

## 2015-09-11 NOTE — Progress Notes (Signed)
Writer gave report to "Brittney" at [facility].

## 2015-09-11 NOTE — Progress Notes (Signed)
Patient ID: Tara Brown, female   DOB: Feb 08, 1961, 54 y.o.   MRN: [id number] PER STATE REGULATIONS 482.30  THIS CHART WAS REVIEWED FOR MEDICAL NECESSITY WITH RESPECT TO THE PATIENT'S ADMISSION/ DURATION OF STAY.  NEXT REVIEW DATE: 09/15/2015  Willa Rough, RN, BSN CASE MANAGER'

## 2015-09-11 NOTE — Progress Notes (Addendum)
Per chart review, pt is medically stable for discharge to return to [facility]. Per MD notes, waiting for psych consult, however Dr. Jama Flavors recommendedon 12/29 patient to return to [facility]. CSW spoke with [facility] who is adding pt back to the consult list. Pt to be seen by pscyh today.   Olga Coaster, LCSW  Clinical Social Work  Covering [phone]

## 2015-09-11 NOTE — Clinical Social Work Psych Assess (Signed)
Clinical Social Work Programme researcher, broadcasting/film/video Social Worker:  Duke Salvia, LCSW Date/Time:  09/11/2015, 12:29 PM Referred By:  Clinical Social Work Date Referred:  09/10/15 Reason for Referral:  Behavioral Health Issues   Presenting Symptoms/Problems  Presenting Symptoms/Problems(in person's/family's own words):  Pt presents from [facility], medical admission secondary to behavioral admission at [facility]. Patient shared she has been increasingly hopless and depressed. She shares that she has no support at home to help her with ADLs and due to contractures and physical disability she is unable to wash her hair or completes activities of daily living easily. Pt shared she has not been able to receive home health services due to not current medical needs, and she is not home bound. Pt shared she has applied for medicaid in the past but was inellgible due to income amount. Pt shares her sister is supportive, and is helping her to find a more afordable apartment so she can hire some help however that may not be until march when her lease is up. She reports wanting to die, feeling helpless, no history of suicide attempts. Pt unable to contract for safety at this time and very tearful.    Abuse/Neglect/Trauma History  Abuse/Neglect/Trauma History:  Denies History Abuse/Neglect/Trauma History Comments (indicate dates):     Psychiatric History  Psychiatric History:  Inpatient/Hospitalization Psychiatric Medication:  zoloft   Current Mental Health Hospitalizations/Previous Mental Health History:  No current provider   Current Provider:  Place and Date:    Current Medications:     Previous Inpatient Admission/Date/Reason:     Emotional Health/Current Symptoms  Suicide/Self Harm: Suicidal Ideation (ex. "I can't take anymore, I wish I could disappear") Suicide Attempt in Past (date/description):    Other Harmful Behavior (ex. homicidal ideation) (describe):      Psychotic/Dissociative Symptoms  Psychotic/Dissociative Symptoms:   Other Psychotic/Dissociative Symptoms:     Attention/Behavioral Symptoms  Attention/Behavioral Symptoms: Withdrawn Other Attention/Behavioral Symptoms:     Cognitive Impairment  Cognitive Impairment:  Within Normal Limits Other Cognitive Impairment:     Mood and Adjustment  Mood and Adjustment:  Depression   Stress, Anxiety, Trauma, Any Recent Loss/Stressor  Stress, Anxiety, Trauma, Any Recent Loss/Stressor: Anxiety Anxiety (frequency):  Anxious, calling to [facility] to make sure she can return   Phobia (specify):    Compulsive Behavior (specify):    Obsessive Behavior (specify):    Other Stress, Anxiety, Trauma, Any Recent Loss/Stressor:     Substance Abuse/Use  Substance Abuse/Use:   SBIRT Completed (please refer for detailed history): No Self-reported Substance Use (last use and frequency):    Urinary Drug Screen Completed: No Alcohol Level:     Environment/Housing/Living Arrangement  Environmental/Housing/Living Arrangement: Stable Housing Who is in the Home:  self  Emergency Contact:  Sister, Elder Cyphers   Financial  Financial: Medicare, Social Security Disability Income   Patient's Strengths and Goals  Patient's Strengths and Goals (patient's own words):  Pt strengths, her family is a strong advocate including step mother, sister, and father. She has family helping her to find more resources. She has a cat that provides companionship as she feels isolated due to physical limitations.     Clinical Social Worker's Interpretive Summary  Clinical Social Workers Interpretive Summary:  Paitent continues to endorse severe hoplessness and unable to contract for safety at this time. Pt shares that she is only living to take care of her cat, but realizes she needs more help. Pt shares she is motivated to get  help for the depression so she can continue to try to obtain higher quality  of life.    Disposition  Disposition: Inpatient Referral Made [facility], [facility], Geri-Psych)  Pt accepted to 401-1 By Dr. Jama Flavors

## 2015-09-11 NOTE — Progress Notes (Signed)
Pt accepted to 404-1 to Dr. Jama Flavors. Pt signed voluntary consent to release information. Pt to be transported by [facility], [phone]. Rn to call report to [phone]. Per ac, ready for patient now.   Olga Coaster, LCSW  Clinical Social Work Covering  [phone]

## 2015-09-11 NOTE — Progress Notes (Signed)
PAtient seen Stable Tells me no acute issues and is anxious to get to [facility] for therapy for her depression No fever chills n/v No pain  Stable for d/c today to [facility]  Pleas Koch, MD [facility] [phone]

## 2015-09-11 NOTE — Tx Team (Signed)
Initial Interdisciplinary Treatment Plan   PATIENT STRESSORS: Health problems   PATIENT STRENGTHS: General fund of knowledge Supportive family/friends   PROBLEM LIST: Problem List/Patient Goals Date to be addressed Date deferred Reason deferred Estimated date of resolution                                                         DISCHARGE CRITERIA:  Improved stabilization in mood, thinking, and/or behavior Need for constant or close observation no longer present  PRELIMINARY DISCHARGE PLAN: Outpatient therapy Placement in alternative living arrangements  PATIENT/FAMIILY INVOLVEMENT: This treatment plan has been presented to and reviewed with the patient, [name].  The patient and family have been given the opportunity to ask questions and make suggestions.  Leighton Parody M 09/11/2015, 4:58 PM

## 2015-09-11 NOTE — BHH Group Notes (Signed)
B[facility] Group Notes:  (Nursing/MHT/Case Management/Adjunct)  Date:  09/11/2015  Time:  2:28 PM  Type of Therapy:  Psychoeducational Skills  Participation Level:  Active  Participation Quality:  Appropriate  Affect:  Appropriate  Cognitive:  Appropriate  Insight:  Appropriate  Engagement in Group:  Engaged  Modes of Intervention:  Discussion  Summary of Progress/Problems: Pt did attend self inventory group.   Jacquelyne Balint Shanta 09/11/2015, 2:28 PM

## 2015-09-11 NOTE — Progress Notes (Signed)
[place] set up for 1230 as requested by RN.   Olga Coaster, LCSW  Clinical Social Work  [phone]

## 2015-09-11 NOTE — Progress Notes (Signed)
D Tavaria is UAL on the [address]  [place] today ...tolerated fair. She avoids making eye contact and lays in her bed.    A  She  Is started back on her maintenance meds and is medicated with po ultram and ativan per pt request. Her affect is depressed and flat.    R Safety in place and poc cont

## 2015-09-11 NOTE — Progress Notes (Signed)
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

## 2015-09-11 NOTE — BHH Group Notes (Signed)
Adult Psychoeducational Group Note  Date:  09/11/2015 Time:  9:30 PM  Group Topic/Focus:  Wrap-Up Group:   The focus of this group is to help patients review their daily goal of treatment and discuss progress on daily workbooks.  Participation Level:  Minimal  Participation Quality:  Appropriate  Affect:  Flat  Cognitive:  Appropriate  Insight: Good  Engagement in Group:  Limited  Modes of Intervention:  Discussion  Additional Comments:  Patient is a new admit.  Patient is meeting with PT and OT Monday for an assessment for home healthcare.  For 2017 she "hopes things all come together".  Caroll Rancher A 09/11/2015, 9:30 PM

## 2015-09-11 NOTE — Progress Notes (Signed)
Pt left at this time headed to [facility] with [facility].

## 2015-09-12 LAB — CULTURE, BLOOD (ROUTINE X 2)
Culture: NO GROWTH
Culture: NO GROWTH

## 2015-09-12 LAB — CBC WITH DIFFERENTIAL/PLATELET
BASOS ABS: 0 10*3/uL (ref 0.0–0.1)
Basophils Relative: 0 %
EOS ABS: 0.3 10*3/uL (ref 0.0–0.7)
Eosinophils Relative: 3 %
HCT: 26.9 % — ABNORMAL LOW (ref 36.0–46.0)
Hemoglobin: 7.8 g/dL — ABNORMAL LOW (ref 12.0–15.0)
LYMPHS PCT: 30 %
Lymphs Abs: 2.6 10*3/uL (ref 0.7–4.0)
MCH: 21.5 pg — ABNORMAL LOW (ref 26.0–34.0)
MCHC: 29 g/dL — ABNORMAL LOW (ref 30.0–36.0)
MCV: 74.3 fL — ABNORMAL LOW (ref 78.0–100.0)
MONOS PCT: 8 %
Monocytes Absolute: 0.7 10*3/uL (ref 0.1–1.0)
NEUTROS PCT: 59 %
Neutro Abs: 5.1 10*3/uL (ref 1.7–7.7)
PLATELETS: 449 10*3/uL — AB (ref 150–400)
RBC: 3.62 MIL/uL — AB (ref 3.87–5.11)
RDW: 19 % — AB (ref 11.5–15.5)
WBC: 8.7 10*3/uL (ref 4.0–10.5)

## 2015-09-12 MED ORDER — LIDOCAINE 5 % EX PTCH
2.0000 | MEDICATED_PATCH | CUTANEOUS | Status: DC
  Administered 2015-09-12 – 2015-09-16 (×5): 2 via TRANSDERMAL
  Filled 2015-09-12 (×8): qty 2

## 2015-09-12 MED ORDER — DOCUSATE SODIUM 100 MG PO CAPS
100.0000 mg | ORAL_CAPSULE | Freq: Every day | ORAL | Status: DC
  Administered 2015-09-12 – 2015-09-17 (×6): 100 mg via ORAL
  Filled 2015-09-12 (×9): qty 1

## 2015-09-12 MED ORDER — FERROUS SULFATE 325 (65 FE) MG PO TABS
325.0000 mg | ORAL_TABLET | Freq: Two times a day (BID) | ORAL | Status: DC
  Administered 2015-09-12 – 2015-09-17 (×11): 325 mg via ORAL
  Filled 2015-09-12 (×14): qty 1

## 2015-09-12 NOTE — BHH Group Notes (Signed)
B[facility] Group Notes: (Clinical Social Work)   09/12/2015      Type of Therapy:  Group Therapy   Participation Level:  Did Not Attend despite MHT prompting   Ambrose Mantle, LCSW 09/12/2015, 3:30 PM

## 2015-09-12 NOTE — BHH Group Notes (Signed)

## 2015-09-12 NOTE — Progress Notes (Signed)
D:  Patient's self inventory sheet, patient sleeps good, sleep medication is helpful.  Good appetite, low energy level, good concentration.  Rated depression and hopeless 6, anxiety 2.  Denied withdrawals.  Denied SI.  Physical problems, pain all over.  Worst pain in past 24 hours is #6.  Pain medication is helpful.  Goal is to "shower".  Plans to shower and talk to MD.  No discharge plans. A:  Medications administered per MD orders.  Emotional support and encouragement. R:  Denied SI and HI, contracts for safety.  Denied A/V hallucinations.  Safety maintained with 15 minute checks.

## 2015-09-12 NOTE — Plan of Care (Signed)
Problem: Consults Goal: Depression Patient Education See Patient Education Module for education specifics.  Outcome: Progressing Nurse discussed depression/coping skills with patient.        
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
## 2015-09-12 NOTE — H&P (Signed)
Psychiatric Admission Assessment Adult  Patient Identification: Tara Brown  MRN:  [id number]  Date of Evaluation:  09/12/2015  Chief Complaint: Worsening symptoms of depression, recurrent episode, severe  Principal Diagnosis: MDD (major depressive disorder), recurrent episode, severe ([place])  Diagnosis:   Patient Active Problem List   Diagnosis Date Noted  . MDD (major depressive disorder), recurrent episode, severe ([place]) [F33.2] 09/11/2015  . Cellulitis of toe, left [L03.032]   . Chronic osteomyelitis ([place]) [M86.60] 09/07/2015  . Stage III chronic kidney disease [N18.3] 08/13/2015  . Anemia, iron deficiency [D50.9]   . Essential hypertension [I10]   . Major depressive disorder, recurrent, severe without psychotic features ([place]) [F33.2] 03/11/2015  . Anemia of chronic disease [D63.8] 03/08/2015   History of Present Illness: Tara Brown is a 55 year old frail Caucasian female. Admitted too [facility] from the [facility] with complaints of worsening symptoms of depression on 09-02-15. During the course of her hospitalization, Tara Brown presented with infected metal plate to left foot. 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 She was sent to the [facility] for surgical removal of the plate. After her hospital evaluation, Tara Brown states that the MD decided that she will rather remove the plate on an outpatient basis & was discharged back to the [facility] to complete treatment for depression. Tara Brown sees a Art therapist Dr. Carron Brazen in [place], [place]. Will schedule an appointment as soon as she is discharged from [facility] for the removal of the metal plate.  During this assessment, Tara Brown reports, "I'm still feeling some depression without suicidal thoughts. I would like to resume my prednisone 10 mg & Lidocaine patch for my back pains. Would like to be evaluated by PT/OT prior to my discharge". Tara Brown rates  depression at #6. Denies anxiety symptoms. She is currently on antibiotic therapy for her infection.  Associated Signs/Symptoms:  Depression Symptoms:  depressed mood, anhedonia, insomnia, recurrent thoughts of death, loss of energy/fatigue, weight loss, decreased appetite, decreased sense of self esteem  (Hypo) Manic Symptoms:  denies   Anxiety Symptoms:  Anxious rumination about difficulties in caring for herself for ADL's  Psychotic Symptoms:  denies   PTSD Symptoms: denies   Total Time spent with patient: 45 minutes   Past Psychiatric History: This is her 3rd psychiatric admission, has not attempted suicide in the past, denies history of self cutting , denies history of psychosis, denies history of mania. States she has been on Lexapro for a period of time, denies side effects. She was here on 08/09/15, sent to medical floor on 08/12/15, then sent home without known follow-up.   Risk to Self: Is patient at risk for suicide?: No Risk to Others: No Prior Inpatient Therapy: Yes Prior Outpatient Therapy: Yes  Alcohol Screening: 1. How often do you have a drink containing alcohol?: Monthly or less 2. How many drinks containing alcohol do you have on a typical day when you are drinking?: 1 or 2 3. How often do you have six or more drinks on one occasion?: Never Preliminary Score: 0 9. Have you or someone else been injured as a result of your drinking?: No 10. Has a relative or friend or a doctor or another health worker been concerned about your drinking or suggested you cut down?: No Alcohol Use Disorder Identification Test Final Score (AUDIT): 1  Substance Abuse History in the last 12 months: No   Consequences of Substance Abuse:  NA  Previous Psychotropic Medications: (Has been on Lexapro for a period of months to years & Ambien 63m)  Psychological Evaluations: Yes  Past Medical History:   Past Medical History  Diagnosis Date  . Major depressive disorder,  recurrent, severe without psychotic features ([place] 03/11/2015    Past Surgical History  Procedure Laterality Date  . Hip fracture surgery    . Knee surgery    . Foot surgery    . Hand surgery    . Neck surgery     Family History:  Mother deceased, father alive , has younger sisters, states father may be alcoholic, does not endorse mental illness in family, but a nephew committed suicide .  Family History  Problem Relation Age of Onset  . Stroke Neg Hx    Family Psychiatric  History:see above .  Social History:  Patient is divorced, has no children, is on disability, lives alone . No legal issues. As noted, major stressor at this time is worsening ability to function independently due to progression of her rheumatologic disease . History  Alcohol Use  . Yes    Comment: Rarely     History  Drug Use No    Social History   Social History  . Marital Status: Divorced    Spouse Name: N/A  . Number of Children: N/A  . Years of Education: N/A   Social History Main Topics  . Smoking status: Never Smoker   . Smokeless tobacco: Never Used  . Alcohol Use: Yes     Comment: Rarely  . Drug Use: No  . Sexual Activity: No   Other Topics Concern  . None   Social History Narrative   Additional Social History:  Allergies:  No Known Allergies  Lab Results: No results found for this or any previous visit (from the past 48 hour(s)).  Metabolic Disorder Labs:  No results found for: HGBA1C, MPG No results found for: PROLACTIN No results found for: CHOL, TRIG, HDL, CHOLHDL, VLDL, LDLCALC  Current Medications: Current Facility-Administered Medications  Medication Dose Route Frequency Provider Last Rate Last Dose  . cephALEXin (KEFLEX) capsule 500 mg  500 mg Oral BID JBenjamine Mola FNP   500 mg at 09/12/15 0759  . cholecalciferol (VITAMIN D) tablet 400 Units  400 Units Oral Daily JDelfin Gant NP   400 Units at 09/12/15 0759  . ferrous sulfate tablet 325 mg  325 mg Oral Q  breakfast JDelfin Gant NP   325 mg at 09/12/15 0758  . folic acid (FOLVITE) tablet 1 mg  1 mg Oral Daily JDelfin Gant NP   1 mg at 09/12/15 0800  . hydrOXYzine (ATARAX/VISTARIL) tablet 25 mg  25 mg Oral Q6H PRN JBenjamine Mola FNP      . lisinopril (PRINIVIL,ZESTRIL) tablet 40 mg  40 mg Oral Daily JDelfin Gant NP   40 mg at 09/12/15 0758  . LORazepam (ATIVAN) tablet 0.5 mg  0.5 mg Oral Q6H PRN JBenjamine Mola FNP   0.5 mg at 09/11/15 12482 . meloxicam (MOBIC) tablet 15 mg  15 mg Oral Daily JBenjamine Mola FNP   15 mg at 09/12/15 0759  . sertraline (ZOLOFT) tablet 75 mg  75 mg Oral Daily JBenjamine Mola FNP   75 mg at 09/12/15 0759  . traMADol (ULTRAM) tablet 100 mg  100 mg Oral Q6H PRN JDelfin Gant NP   100 mg at 09/12/15 0805  . zolpidem (AMBIEN) tablet 10 mg  10  mg Oral QHS PRN Benjamine Mola, FNP   10 mg at 09/11/15 2108   PTA Medications: Prescriptions prior to admission  Medication Sig Dispense Refill Last Dose  . cephALEXin (KEFLEX) 500 MG capsule Take 1 capsule (500 mg total) by mouth 2 (two) times daily. 28 capsule 0   . Cholecalciferol (VITAMIN D PO) Take 1 capsule by mouth daily.   09/07/2015 at Unknown time  . CIMZIA PREFILLED 2 X 200 MG/ML KIT Inject as directed every 14 (fourteen) days.   0 08/27/2015 at Unknown time  . ferrous sulfate 325 (65 FE) MG tablet Take 325 mg by mouth daily with breakfast.   09/07/2015 at Unknown time  . folic acid (FOLVITE) 1 MG tablet Take 1 mg by mouth daily.   2 weeks  . lisinopril (PRINIVIL,ZESTRIL) 40 MG tablet Take 40 mg by mouth daily.   09/07/2015 at Unknown time  . LORazepam (ATIVAN) 0.5 MG tablet Take 0.5 mg by mouth every 6 (six) hours as needed for anxiety.   09/07/2015 at Unknown time  . sertraline (ZOLOFT) 50 MG tablet Take 75 mg by mouth daily.   09/07/2015 at Unknown time  . traMADol (ULTRAM) 50 MG tablet Take 100 mg by mouth twice daily as needed for pain   09/07/2015 at 1230  . zolpidem (AMBIEN) 10 MG tablet  Take 1 tablet (10 mg total) by mouth at bedtime as needed for sleep. 30 tablet 0 09/06/2015 at Unknown time   Musculoskeletal: Strength & Muscle Tone: within normal limits-   Gait & Station: unsteady, impaired range of motion related to chronic arthritis Patient leans: N/A  Psychiatric Specialty Exam: Physical Exam  Constitutional: She is oriented to person, place, and time. She appears well-developed.  HENT:  Head: Normocephalic.  Eyes: Pupils are equal, round, and reactive to light.  Neck: Normal range of motion.  Cardiovascular: Normal rate.   Respiratory: Effort normal.  GI: Soft.  Genitourinary:  Denies any issues in this area  Musculoskeletal: Normal range of motion.  Neurological: She is alert and oriented to person, place, and time.  Skin: Skin is warm and dry.  Psychiatric: Her speech is normal and behavior is normal. Judgment and thought content normal. Her mood appears anxious. Her affect is not angry, not blunt, not labile and not inappropriate. Cognition and memory are normal. She exhibits a depressed mood.    Review of Systems  Constitutional: Positive for weight loss and malaise/fatigue.  HENT: Negative.   Eyes: Negative.   Respiratory: Negative.   Cardiovascular: Negative.   Gastrointestinal: Negative for nausea and vomiting.  Genitourinary: Negative.   Musculoskeletal: Positive for joint pain.       Chronic arthritis affecting multiple joints    Skin: Negative.   Neurological: Negative for seizures.  Endo/Heme/Allergies: Negative.   Psychiatric/Behavioral: Positive for depression and suicidal ideas (although minimizing). Negative for hallucinations, memory loss and substance abuse. The patient is nervous/anxious and has insomnia.     Blood pressure 96/61, pulse 90, temperature 97.9 F (36.6 C), temperature source Oral, resp. rate 16, height 5' 4.7" (1.643 m), weight 48.081 kg (106 lb).Body mass index is 17.81 kg/(m^2).  General Appearance: Fairly Groomed   Engineer, water::  Good  Speech:  Normal Rate  Volume:  Normal  Mood:  Depressed  Affect:  Appropriate, Congruent and Depressed  Thought Process:  Coherent, Goal Directed and Linear  Orientation:  Full (Time, Place, and Person)  Thought Content:  no hallucinations, no delusions, ruminative about decline in functional level  related to her chronic illness   Suicidal Thoughts:  Yes, although minimizing  Homicidal Thoughts:  No  Memory:  Immediate;   Fair Recent;   Fair Remote;   Fair  Judgement:  Fair  Insight:  Present  Psychomotor Activity:  Normal-  Concentration:  Good  Recall:  Good  Fund of Knowledge:Good  Language: Good  Akathisia:  Negative  Handed:  Right  AIMS (if indicated):     Assets:  Communication Skills Resilience  ADL's:   Fair   Cognition: WNL  Sleep:  Number of Hours: 5.5   Treatment Plan Summary: Daily contact with patient to assess and evaluate symptoms and progress in treatment, Medication management, Plan inpatient admission and medications as below 1. Admit for crisis management and stabilization, estimated length of stay 3-5 days.  2. Medication management to reduce current symptoms to base line and improve the patient's overall level of functioning;  Hydroxyzine 25 mg, Lorazepam 0.5 mg, Sertraline 75 mg, Ambien 10 mg,  3. Treat health problems as indicated; Tramadol 100 mg, Lisinopril 40 mg, Ferrous sukfate 325 mg, Vitamin-D 400 units, Lidocaine patch for pain management, PT/OT consults. 4. Develop treatment plan to decrease the need for readmission.  5. Psycho-social education regarding relapse prevention and self care.  6. Health care follow up as needed for medical problems.  7. Review, reconcile, and reinstate any pertinent home medications for other health issues where appropriate. 8. Call for consults with hospitalist for any additional specialty patient care services as needed.   Observation Level/Precautions:  15 minute checks  Laboratory: Per ED   Psychotherapy:  Group therapy, individual therapy, psychoeducation  Medications: Hydroxyzine 25 mg, Lorazepam 0.5 mg, Sertraline 75 mg, Ambien 10 mg, Tramadol 100 mg, Lisinopril 40 mg, Ferrous sukfate 325 mg, Vitamin-D 400 units, Keflex 500 mg  Consultations: As needed  Discharge Concerns: Safety, Mood stability  Estimated LOS: 3-5 days  Other:  N/A   I certify that inpatient services furnished can reasonably be expected to improve the patient's condition.    Encarnacion Slates, PMHNP, FNP-BC 1/1/20179:55 AM I personally assessed the patient, reviewed the physical exam and labs and formulated the treatment plan Geralyn Flash A. Sabra Heck, M.D.

## 2015-09-12 NOTE — Progress Notes (Signed)
Writer observed patient up in the dayroom watching tv with minimal interaction with peers. She reports having had a good day and denies si/hi/a/v hallucinations. She reports that she is hopeful that home healthcare will be able to provide assistance for her once discharged. Support given and safety maintained on unit with 15 min checks.

## 2015-09-12 NOTE — [facility] Suicide Risk Assessment (Signed)
[facility] Admission Suicide Risk Assessment   Nursing information obtained from:  Patient Demographic factors:  Divorced or widowed, Caucasian Current Mental Status:  Self-harm thoughts Loss Factors:  Decline in physical health Historical Factors:  NA Risk Reduction Factors:  Positive social support Total Time spent with patient: 45 minutes Principal Problem: <principal problem not specified> Diagnosis:   Patient Active Problem List   Diagnosis Date Noted  . MDD (major depressive disorder), recurrent episode, severe ([facility]) [F33.2] 09/11/2015  . Cellulitis of toe, left [L03.032]   . Chronic osteomyelitis ([facility]) [M86.60] 09/07/2015  . Stage III chronic kidney disease [N18.3] 08/13/2015  . Anemia, iron deficiency [D50.9]   . Essential hypertension [I10]   . Major depressive disorder, recurrent, severe without psychotic features ([facility]) [F33.2] 03/11/2015  . Anemia of chronic disease [D63.8] 03/08/2015     Continued Clinical Symptoms:  Alcohol Use Disorder Identification Test Final Score (AUDIT): 1 The "Alcohol Use Disorders Identification Test", Guidelines for Use in Primary Care, Second Edition.  World Science writer [facility]). Score between 0-7:  no or low risk or alcohol related problems. Score between 8-15:  moderate risk of alcohol related problems. Score between 16-19:  high risk of alcohol related problems. Score 20 or above:  warrants further diagnostic evaluation for alcohol dependence and treatment.   CLINICAL FACTORS:   Depression:   Severe Chronic Pain   Musculoskeletal: Strength & Muscle Tone: affected by arthritis Gait & Station: normal Patient leans: normal  Psychiatric Specialty Exam: Physical Exam  Review of Systems  Constitutional: Positive for malaise/fatigue.  Eyes: Negative.   Respiratory: Negative.   Cardiovascular: Negative.   Gastrointestinal: Negative.   Genitourinary: Negative.   Musculoskeletal: Positive for myalgias, back pain, joint pain and neck  pain.  Skin: Positive for itching and rash.  Neurological: Positive for dizziness and weakness.  Endo/Heme/Allergies: Negative.   Psychiatric/Behavioral: Positive for depression. The patient is nervous/anxious.     Blood pressure 96/61, pulse 90, temperature 97.9 F (36.6 C), temperature source Oral, resp. rate 16, height 5' 4.7" (1.643 m), weight 48.081 kg (106 lb).Body mass index is 17.81 kg/(m^2).  General Appearance: Fairly Groomed  Patent attorney::  Fair  Speech:  Clear and Coherent  Volume:  Decreased  Mood:  Anxious and Depressed  Affect:  Depressed  Thought Process:  Coherent and Goal Directed  Orientation:  Full (Time, Place, and Person)  Thought Content:  symptoms events worries concerns  Suicidal Thoughts:  No  Homicidal Thoughts:  No  Memory:  Immediate;   Fair Recent;   Fair Remote;   Fair  Judgement:  Fair  Insight:  Present  Psychomotor Activity:  Normal  Concentration:  Fair  Recall:  [employer] of Knowledge:Fair  Language: Fair  Akathisia:  No  Handed:  Right  AIMS (if indicated):     Assets:  Desire for Improvement Housing  Sleep:  Number of Hours: 5.5  Cognition: WNL  ADL's:  Intact     COGNITIVE FEATURES THAT CONTRIBUTE TO RISK:  None    SUICIDE RISK:   Mild:  Suicidal ideation of limited frequency, intensity, duration, and specificity.  There are no identifiable plans, no associated intent, mild dysphoria and related symptoms, good self-control (both objective and subjective assessment), few other risk factors, and identifiable protective factors, including available and accessible social support. Has severe arthritis cant take care of herself anymore. States she got overwhelmed. Feels she is a burden to her family. Lives by herself. Depression sadness lack of energy lack of motivation  at home not eating, states she was suicidal  States she has been on Lexapro for 5 years. 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   Hallie Ertl A 09/12/2015, 11:52 AM

## 2015-09-12 NOTE — Progress Notes (Signed)
Patient ID: Tara Brown, female   DOB: 1961/05/21, 55 y.o.   MRN: [id number] Per State regulations 482.30 this chart was reviewed for medical necessity with respect to the patient's admission/duration of stay.    Next review date:09/15/15  Thurman Coyer, BSN, RN-BC  Case Manager

## 2015-09-12 NOTE — Plan of Care (Signed)
Problem: Diagnosis: Increased Risk For Suicide Attempt Goal: STG-Patient Will Attend All Groups On The Unit Outcome: Progressing Patient attended evening group on 09/11/15 and participated.     

## 2015-09-13 DIAGNOSIS — F332 Major depressive disorder, recurrent severe without psychotic features: Principal | ICD-10-CM

## 2015-09-13 MED ORDER — SERTRALINE HCL 100 MG PO TABS
100.0000 mg | ORAL_TABLET | Freq: Every day | ORAL | Status: DC
  Administered 2015-09-14: 100 mg via ORAL
  Filled 2015-09-13 (×3): qty 1

## 2015-09-13 NOTE — Tx Team (Signed)
Interdisciplinary Treatment Plan Update (Adult) Date: 09/13/2015   Date: 09/13/2015 9:57 AM  Progress in Treatment:  Attending groups: Yes  Participating in groups: Yes  Taking medication as prescribed: Yes  Tolerating medication: Yes  Family/Significant othe contact made: No, CSW attempting to make contact with sister Patient understands diagnosis: Yes AEB seeking help with depression Discussing patient identified problems/goals with staff: Yes  Medical problems stabilized or resolved: Yes  Denies suicidal/homicidal ideation: Yes Patient has not harmed self or Others: Yes   New problem(s) identified: None identified at this time.   Discharge Plan or Barriers: Home with in-home aid v. ALF placement.   Additional comments:  Patient and CSW reviewed pt's identified goals and treatment plan. Patient verbalized understanding and agreed to treatment plan. CSW reviewed [facility] "Discharge Process and Patient Involvement" Form. Pt verbalized understanding of information provided and signed form.   Reason for Continuation of Hospitalization:  Anxiety Depression Medication stabilization Suicidal ideation  Estimated length of stay: 3-5 days  Review of initial/current patient goals per problem list:   1.  Goal(s): Patient will participate in aftercare plan  Met:  No  Target date: 3-5 days from date of admission   As evidenced by: Patient will participate within aftercare plan AEB aftercare provider and housing plan at discharge being identified.   09/03/15: CSW is assessing for appropriate plan. Home with in-home aid v. ALF placement. Pt will need referral for outpatient services  2.  Goal (s): Patient will exhibit decreased depressive symptoms and suicidal ideations.  Met:  No  Target date: 3-5 days from date of admission   As evidenced by: Patient will utilize self rating of depression at 3 or below and demonstrate decreased signs of depression or be deemed stable for discharge by  MD. 09/13/15: Pt rates depression at 6/10 and presents with flat and withdrawn affect. Denies SI  3.  Goal(s): Patient will demonstrate decreased signs and symptoms of anxiety.  Met:  No  Target date: 3-5 days from date of admission   As evidenced by: Patient will utilize self rating of anxiety at 3 or below and demonstrated decreased signs of anxiety, or be deemed stable for discharge by MD  09/13/15: Pt rates anxiety at 6/10; affect congruent.  Attendees:  Patient:    Family:    Physician: Dr. Sabra Heck, MD  09/13/2015 9:57 AM  Nursing: Lars Pinks, RN Case manager  09/13/2015 9:57 AM  Clinical Social Worker Peri Maris, [place] 09/13/2015 9:57 AM  Other: Tilden Fossa, [place] 09/13/2015 9:57 AM  Clinical: Loletta Specter, RN 09/13/2015 9:57 AM  Other: , RN Charge Nurse 09/13/2015 9:57 AM  Other:     Peri Maris, [place] Social Work [phone]

## 2015-09-13 NOTE — B[facility] Group Notes (Signed)
[facility] LCSW Group Therapy  09/13/2015 1:15pm  Type of Therapy:  Group Therapy vercoming Obstacles  Participation Level:  Minimal  Participation Quality:  Appropriate   Affect:  Flat  Cognitive:  Appropriate and Oriented  Insight:  Developing/Improving and Improving  Engagement in Therapy:  Improving  Modes of Intervention:  Discussion, Exploration, Problem-solving and Support  Description of Group:   In this group patients will be encouraged to explore what they see as obstacles to their own wellness and recovery. They will be guided to discuss their thoughts, feelings, and behaviors related to these obstacles. The group will process together ways to cope with barriers, with attention given to specific choices patients can make. Each patient will be challenged to identify changes they are motivated to make in order to overcome their obstacles. This group will be process-oriented, with patients participating in exploration of their own experiences as well as giving and receiving support and challenge from other group members.  Summary of Patient Progress: Pt participated minimally but did identify a way to overcome an obstacle could be to learn to adapt to a circumstance.   Therapeutic Modalities:   Cognitive Behavioral Therapy Solution Focused Therapy Motivational Interviewing Relapse Prevention Therapy   Chad Cordial, LCSWA 09/13/2015 2:55 PM

## 2015-09-13 NOTE — Plan of Care (Signed)
Problem: Alteration in mood Goal: LTG-Patient reports reduction in suicidal thoughts (Patient reports reduction in suicidal thoughts and is able to verbalize a safety plan for whenever patient is feeling suicidal)  Outcome: Progressing Patient currently denies suicidal ideations.      

## 2015-09-13 NOTE — B[facility] Group Notes (Signed)
[facility] LCSW Aftercare Discharge Planning Group Note  09/13/2015 8:45 AM  Participation Quality: Alert, Appropriate and Oriented  Mood/Affect: Flat  Depression Rating: 6  Anxiety Rating: 6  Thoughts of Suicide: Pt denies SI/HI  Will you contract for safety? Yes  Current AVH: Pt denies  Plan for Discharge/Comments: Pt attended discharge planning group and actively participated in group. CSW discussed suicide prevention education with the group and encouraged them to discuss discharge planning and any relevant barriers. Pt reports that she is anxious about her discharge plan as she does not know what will happen when she goes home.  Transportation Means: Pt reports access to transportation  Supports: No supports mentioned at this time  Chad Cordial, LCSWA 09/13/2015 9:59 AM

## 2015-09-13 NOTE — Progress Notes (Signed)
Patient has been up in the dayroom briefly, watching tv and minimal interaction with peers. She was compliant with her scheduled meds and requested prns for pain, anxiety and sleep before lying down. She is waiting for OTC and PT to see her to determine if there is a need for home health services. She denies si/hi/a/v hallucinations. Support given and safety maintained on unit with 15 min checks.

## 2015-09-13 NOTE — Progress Notes (Signed)
Adult Psychoeducational Group Note  Date:  09/13/2015 Time:  8:20 PM  Group Topic/Focus:  Wrap-Up Group:   The focus of this group is to help patients review their daily goal of treatment and discuss progress on daily workbooks.  Participation Level:  Minimal  Participation Quality:  Appropriate  Affect:  Flat  Cognitive:  Appropriate  Insight: Limited  Engagement in Group:  Limited  Modes of Intervention:  Discussion  Additional Comments:  Pt rated overall day a 5 out of 10 because she slept most of the day and was feeling drowsy. Pt reported that she completed her goal for the day, which was to get social services to call her parents.   Cleotilde Neer 09/13/2015, 9:06 PM

## 2015-09-13 NOTE — Progress Notes (Signed)
Patient ID: Tara Brown, female   DOB: 1961-03-09, 55 y.o.   MRN: [id number] [facility] MD Progress Note  09/13/2015  Tara Brown  MRN:  [id number] Subjective:  Pt states: "I don't feel any better at all. I actually feel the same."  Objective: Pt seen and chart reviewed. Pt is alert/oriented x4, calm, cooperative, and appropriate to situation. Pt denies homicidal ideation and psychosis and does not appear to be responding to internal stimuli. Pt reports that she feels she is not improving whatsoever. Reports poor sleep and appetite as well. Some fleeting suicidal thoughts without plan.   Principal Problem: MDD (major depressive disorder), recurrent episode, severe (HCC) Diagnosis:   Patient Active Problem List   Diagnosis Date Noted  . Major depressive disorder, recurrent, severe without psychotic features (HCC) [F33.2] 03/11/2015    Priority: High  . MDD (major depressive disorder), recurrent episode, severe (HCC) [F33.2] 09/11/2015  . Cellulitis of toe, left [L03.032]   . Chronic osteomyelitis (HCC) [M86.60] 09/07/2015  . Stage III chronic kidney disease [N18.3] 08/13/2015  . Anemia, iron deficiency [D50.9]   . Essential hypertension [I10]   . Anemia of chronic disease [D63.8] 03/08/2015   Total Time spent with patient:  15 minutes    Past Medical History:  Past Medical History  Diagnosis Date  . Major depressive disorder, recurrent, severe without psychotic features (HCC) 03/11/2015    Past Surgical History  Procedure Laterality Date  . Hip fracture surgery    . Knee surgery    . Foot surgery    . Hand surgery    . Neck surgery     Family History:  Family History  Problem Relation Age of Onset  . Stroke Neg Hx     Social History:  History  Alcohol Use  . Yes    Comment: Rarely     History  Drug Use No    Social History   Social History  . Marital Status: Divorced    Spouse Name: N/A  . Number of Children: N/A  . Years of Education: N/A   Social History  Main Topics  . Smoking status: Never Smoker   . Smokeless tobacco: Never Used  . Alcohol Use: Yes     Comment: Rarely  . Drug Use: No  . Sexual Activity: No   Other Topics Concern  . None   Social History Narrative   Additional Social History:   Sleep: Fair  Appetite:   Fair   Current Medications: Current Facility-Administered Medications  Medication Dose Route Frequency Provider Last Rate Last Dose  . cephALEXin (KEFLEX) capsule 500 mg  500 mg Oral BID Beau Fanny, FNP   500 mg at 09/13/15 0802  . cholecalciferol (VITAMIN D) tablet 400 Units  400 Units Oral Daily Earney Navy, NP   400 Units at 09/13/15 0801  . docusate sodium (COLACE) capsule 100 mg  100 mg Oral Daily Sanjuana Kava, NP   100 mg at 09/13/15 0801  . ferrous sulfate tablet 325 mg  325 mg Oral BID WC Sanjuana Kava, NP   325 mg at 09/13/15 0801  . folic acid (FOLVITE) tablet 1 mg  1 mg Oral Daily Earney Navy, NP   1 mg at 09/13/15 0801  . hydrOXYzine (ATARAX/VISTARIL) tablet 25 mg  25 mg Oral Q6H PRN Beau Fanny, FNP      . lidocaine (LIDODERM) 5 % 2 patch  2 patch Transdermal Q24H Sanjuana Kava, NP   2 patch at  09/13/15 1431  . lisinopril (PRINIVIL,ZESTRIL) tablet 40 mg  40 mg Oral Daily Earney Navy, NP   40 mg at 09/13/15 0802  . LORazepam (ATIVAN) tablet 0.5 mg  0.5 mg Oral Q6H PRN Beau Fanny, FNP   0.5 mg at 09/13/15 1431  . meloxicam (MOBIC) tablet 15 mg  15 mg Oral Daily Beau Fanny, FNP   15 mg at 09/13/15 0801  . [START ON 09/14/2015] sertraline (ZOLOFT) tablet 100 mg  100 mg Oral Daily Beau Fanny, FNP      . traMADol Janean Sark) tablet 100 mg  100 mg Oral Q6H PRN Earney Navy, NP   100 mg at 09/13/15 1431  . zolpidem (AMBIEN) tablet 10 mg  10 mg Oral QHS PRN Beau Fanny, FNP   10 mg at 09/12/15 2111    Lab Results:  Results for orders placed or performed during the hospital encounter of 09/11/15 (from the past 48 hour(s))  CBC with Differential/Platelet     Status:  Abnormal   Collection Time: 09/12/15  6:30 PM  Result Value Ref Range   WBC 8.7 4.0 - 10.5 K/uL   RBC 3.62 (L) 3.87 - 5.11 MIL/uL   Hemoglobin 7.8 (L) 12.0 - 15.0 g/dL   HCT 58.8 (L) 32.5 - 49.8 %   MCV 74.3 (L) 78.0 - 100.0 fL   MCH 21.5 (L) 26.0 - 34.0 pg   MCHC 29.0 (L) 30.0 - 36.0 g/dL   RDW 26.4 (H) 15.8 - 30.9 %   Platelets 449 (H) 150 - 400 K/uL   Neutrophils Relative % 59 %   Lymphocytes Relative 30 %   Monocytes Relative 8 %   Eosinophils Relative 3 %   Basophils Relative 0 %   Neutro Abs 5.1 1.7 - 7.7 K/uL   Lymphs Abs 2.6 0.7 - 4.0 K/uL   Monocytes Absolute 0.7 0.1 - 1.0 K/uL   Eosinophils Absolute 0.3 0.0 - 0.7 K/uL   Basophils Absolute 0.0 0.0 - 0.1 K/uL   RBC Morphology ELLIPTOCYTES     Comment: Performed at [facility]    Physical Findings: AIMS: Facial and Oral Movements Muscles of Facial Expression: None, normal Lips and Perioral Area: None, normal Jaw: None, normal Tongue: None, normal,Extremity Movements Upper (arms, wrists, hands, fingers): None, normal Lower (legs, knees, ankles, toes): None, normal, Trunk Movements Neck, shoulders, hips: None, normal, Overall Severity Severity of abnormal movements (highest score from questions above): None, normal Incapacitation due to abnormal movements: None, normal Patient's awareness of abnormal movements (rate only patient's report): No Awareness, Dental Status Current problems with teeth and/or dentures?: No Does patient usually wear dentures?: No  CIWA:  CIWA-Ar Total: 1 COWS:  COWS Total Score: 1  Musculoskeletal: Strength & Muscle Tone: within normal limits- range of motion  Affected by arthritis  Gait & Station: normal Patient leans: N/A  Psychiatric Specialty Exam: Review of Systems  Musculoskeletal: Positive for myalgias, back pain and joint pain.  Psychiatric/Behavioral: Positive for depression. Negative for suicidal ideas, hallucinations and substance abuse. The patient is  nervous/anxious and has insomnia.   All other systems reviewed and are negative.  chronic joint pain related to chronic arthritis, chronic ulceration with drainage and erythema on L toe, no fever, no chills   Blood pressure 109/64, pulse 92, temperature 99.2 F (37.3 C), temperature source Oral, resp. rate 18, height 5' 4.7" (1.643 m), weight 48.081 kg (106 lb).Body mass index is 17.81 kg/(m^2).  General Appearance: Fairly Groomed  Eye Contact::  Good  Speech:  Normal Rate  Volume:  Normal  Mood:   Depressed, but improved compared to admission  Affect:  Constricted - vaguely irritable   Thought Process:  Linear  Orientation:  Other:  fully alert and attentive   Thought Content:  no hallucinations, no delusions, not internally preoccupied   Suicidal Thoughts:  No denies plan or intention of hurting self or of SI  Homicidal Thoughts:  No  Memory:  recent and remote grossly intact   Judgement:  Other:  improving  Insight:  Present  Psychomotor Activity:  Limited by chronic arthritis   Concentration:  Good  Recall:  good  Fund of Knowledge:Good  Language: Good  Akathisia:  Negative  Handed:  Right  AIMS (if indicated):     Assets:  Communication Skills Desire for Improvement Resilience  ADL's:   Fair   Cognition: WNL  Sleep:  Number of Hours: 6  Assessment - patient remains depressed and continues to affirm that nothing is working for her. Objectively, she appears to be slightly better and is more interactive with staff and other patients.   Treatment Plan Summary: Daily contact with patient to assess and evaluate symptoms and progress in treatment, Medication management, Plan inpatient admission and medications as below Encourage group and milieu participation to work on coping skills and symptom reduction -Continue Ambien to 10mg  qhs prn insomnia -Continue Zoloft  Now at 75 mg daily for MDD -Continue Vistaril 25mg  tid prn anxiety -Continue Keflex 500mg  bid for foot  infection -Continue Mobic 15mg  daily for chronic pain -Increase Zoloft to 100mg  daily (tomorrow morning) for MDD -Continue Ativan 0.5mg  q6h prn severe anxiety   , FNP-BC 09/13/2015, 11:30 AM

## 2015-09-13 NOTE — Progress Notes (Signed)
D: Pt denies SI/HI/AVH. Pt is pleasant and cooperative. Pt with flat affect, pt forwards little when talking. Pt was seen on the milieu, but appeared to have minimal interaction with peers.   A: Pt was offered support and encouragement. Pt was given scheduled medications. Pt was encourage to attend groups. Q 15 minute checks were done for safety.   R:Pt attends groups and interacts well with peers and staff. Pt is taking medication. Pt receptive to treatment and safety maintained on unit.

## 2015-09-13 NOTE — Plan of Care (Signed)
Problem: Alteration in mood Goal: LTG-Patient reports reduction in suicidal thoughts (Patient reports reduction in suicidal thoughts and is able to verbalize a safety plan for whenever patient is feeling suicidal)  Outcome: Progressing Pt denies SI at this time     

## 2015-09-13 NOTE — Plan of Care (Signed)
Problem: Ineffective individual coping Goal: STG: Patient will remain free from self harm Outcome: Progressing Patient has not engaged in self harm, denies SI.  Problem: Diagnosis: Increased Risk For Suicide Attempt Goal: STG-Patient Will Comply With Medication Regime Outcome: Progressing Patient is med compliant. Requesting prn medications when appropriate.

## 2015-09-13 NOTE — Progress Notes (Signed)
Spoke with patient 1:1. She remains flat and depressed however is cooperative. Rates depression and hopelessness both at a 6/10 and anxiety at a 5/10. Chronic back and hip pain remains at a 6-7/10 and after medication, ranges at a 2-3/10. Patient forwards minimal information, is isolative at times. Medicated per orders, support and reassurance given. She denies SI to this Clinical research associate as well as on self inventory. No HI/AVH. Patient remains safe. Tara Brown

## 2015-09-13 NOTE — Progress Notes (Signed)
Recreation Therapy Notes  Date: 01.02.2017 Time: 9:30am Location: 300 Group Room   Group Topic: Stress Management  Goal Area(s) Addresses:  Patient will actively participate in stress management techniques presented during session.   Behavioral Response: Did not attend.   Hancel Ion L Madalena Kesecker, LRT/CTRS        Makinna Andy L 09/13/2015 10:21 AM 

## 2015-09-14 MED ORDER — SERTRALINE HCL 50 MG PO TABS
150.0000 mg | ORAL_TABLET | Freq: Every day | ORAL | Status: DC
  Administered 2015-09-15 – 2015-09-17 (×3): 150 mg via ORAL
  Filled 2015-09-14 (×6): qty 3

## 2015-09-14 NOTE — Progress Notes (Signed)
Patient ID: Tara Brown, female   DOB: 1960/09/18, 55 y.o.   MRN: [id number] [facility] MD Progress Note  09/14/2015  Tara Brown  MRN:  [id number] Subjective:   Patient reports ongoing depression, and feelings of frustration regarding  Chronicity of foot infection and lack of definitive surgery to address thus far . Denies medication side effects. Reports Zoloft does seem to help, albeit partially.  Pain on L toe somewhat improved corresponding to clinical improvement .  Objective: Pt seen and chart reviewed.  Patient known to me from prior admission. At this time remains depressed, sad, and intermittently tearful, but states that her mood is improving and that some of her current symptoms reflect frustration about not having been able to return home yet, and also concern that if she is not home by tomorrow she may be unable to pay her rent. Affect responds partially to reassurance , support, and patient's affect improves with reassurance . As discussed with patient / CSW- patient's goal at this time is to return home - will request PT/OT consultations to help determine level of care/management needed after discharge, so that CSW can work on disposition planning/services. CSW contacted via phone and also helped reassure patient regarding disposition planning and that with her express consent will help her contact her landlord to help insure she does not get penalized regarding rent due to being in hospital . Toe infection site inspected- less erythema and no active drainage at this time- appears  to be responding well to Keflex course .  Principal Problem: MDD (major depressive disorder), recurrent episode, severe (HCC) Diagnosis:   Patient Active Problem List   Diagnosis Date Noted  . MDD (major depressive disorder), recurrent episode, severe (HCC) [F33.2] 09/11/2015  . Cellulitis of toe, left [L03.032]   . Chronic osteomyelitis (HCC) [M86.60] 09/07/2015  . Stage III chronic kidney disease  [N18.3] 08/13/2015  . Anemia, iron deficiency [D50.9]   . Essential hypertension [I10]   . Major depressive disorder, recurrent, severe without psychotic features (HCC) [F33.2] 03/11/2015  . Anemia of chronic disease [D63.8] 03/08/2015   Total Time spent with patient:  20 minutes    Past Medical History:  Past Medical History  Diagnosis Date  . Major depressive disorder, recurrent, severe without psychotic features (HCC) 03/11/2015    Past Surgical History  Procedure Laterality Date  . Hip fracture surgery    . Knee surgery    . Foot surgery    . Hand surgery    . Neck surgery     Family History:  Family History  Problem Relation Age of Onset  . Stroke Neg Hx     Social History:  History  Alcohol Use  . Yes    Comment: Rarely     History  Drug Use No    Social History   Social History  . Marital Status: Divorced    Spouse Name: N/A  . Number of Children: N/A  . Years of Education: N/A   Social History Main Topics  . Smoking status: Never Smoker   . Smokeless tobacco: Never Used  . Alcohol Use: Yes     Comment: Rarely  . Drug Use: No  . Sexual Activity: No   Other Topics Concern  . None   Social History Narrative   Additional Social History:   Sleep:  Improved   Appetite:   Fair   Current Medications: Current Facility-Administered Medications  Medication Dose Route Frequency Provider Last Rate Last Dose  . cephALEXin (KEFLEX) capsule  500 mg  500 mg Oral BID Beau Fanny, FNP   500 mg at 09/14/15 1829  . cholecalciferol (VITAMIN D) tablet 400 Units  400 Units Oral Daily Earney Navy, NP   400 Units at 09/14/15 0806  . docusate sodium (COLACE) capsule 100 mg  100 mg Oral Daily Sanjuana Kava, NP   100 mg at 09/14/15 0806  . ferrous sulfate tablet 325 mg  325 mg Oral BID WC Sanjuana Kava, NP   325 mg at 09/14/15 0807  . folic acid (FOLVITE) tablet 1 mg  1 mg Oral Daily Earney Navy, NP   1 mg at 09/14/15 9371  . hydrOXYzine  (ATARAX/VISTARIL) tablet 25 mg  25 mg Oral Q6H PRN Beau Fanny, FNP      . lidocaine (LIDODERM) 5 % 2 patch  2 patch Transdermal Q24H Sanjuana Kava, NP   2 patch at 09/13/15 1431  . lisinopril (PRINIVIL,ZESTRIL) tablet 40 mg  40 mg Oral Daily Earney Navy, NP   40 mg at 09/14/15 0806  . LORazepam (ATIVAN) tablet 0.5 mg  0.5 mg Oral Q6H PRN Beau Fanny, FNP   0.5 mg at 09/13/15 1431  . meloxicam (MOBIC) tablet 15 mg  15 mg Oral Daily Beau Fanny, FNP   15 mg at 09/14/15 0806  . [START ON 09/15/2015] sertraline (ZOLOFT) tablet 150 mg  150 mg Oral Daily Rockey Situ Aziya Arena, MD      . traMADol (ULTRAM) tablet 100 mg  100 mg Oral Q6H PRN Earney Navy, NP   100 mg at 09/14/15 0813  . zolpidem (AMBIEN) tablet 10 mg  10 mg Oral QHS PRN Beau Fanny, FNP   10 mg at 09/13/15 2204    Lab Results:  Results for orders placed or performed during the hospital encounter of 09/11/15 (from the past 48 hour(s))  CBC with Differential/Platelet     Status: Abnormal   Collection Time: 09/12/15  6:30 PM  Result Value Ref Range   WBC 8.7 4.0 - 10.5 K/uL   RBC 3.62 (L) 3.87 - 5.11 MIL/uL   Hemoglobin 7.8 (L) 12.0 - 15.0 g/dL   HCT 69.6 (L) 78.9 - 38.1 %   MCV 74.3 (L) 78.0 - 100.0 fL   MCH 21.5 (L) 26.0 - 34.0 pg   MCHC 29.0 (L) 30.0 - 36.0 g/dL   RDW 01.7 (H) 51.0 - 25.8 %   Platelets 449 (H) 150 - 400 K/uL   Neutrophils Relative % 59 %   Lymphocytes Relative 30 %   Monocytes Relative 8 %   Eosinophils Relative 3 %   Basophils Relative 0 %   Neutro Abs 5.1 1.7 - 7.7 K/uL   Lymphs Abs 2.6 0.7 - 4.0 K/uL   Monocytes Absolute 0.7 0.1 - 1.0 K/uL   Eosinophils Absolute 0.3 0.0 - 0.7 K/uL   Basophils Absolute 0.0 0.0 - 0.1 K/uL   RBC Morphology ELLIPTOCYTES     Comment: Performed at [facility]    Physical Findings: AIMS: Facial and Oral Movements Muscles of Facial Expression: None, normal Lips and Perioral Area: None, normal Jaw: None, normal Tongue: None,  normal,Extremity Movements Upper (arms, wrists, hands, fingers): None, normal Lower (legs, knees, ankles, toes): None, normal, Trunk Movements Neck, shoulders, hips: None, normal, Overall Severity Severity of abnormal movements (highest score from questions above): None, normal Incapacitation due to abnormal movements: None, normal Patient's awareness of abnormal movements (rate only patient's report): No  Awareness, Dental Status Current problems with teeth and/or dentures?: No Does patient usually wear dentures?: No  CIWA:  CIWA-Ar Total: 1 COWS:  COWS Total Score: 1  Musculoskeletal: Strength & Muscle Tone: within normal limits- range of motion  Affected by arthritis  Gait & Station: normal Patient leans: N/A  Psychiatric Specialty Exam: Review of Systems  Musculoskeletal: Positive for myalgias, back pain and joint pain.  Psychiatric/Behavioral: Positive for depression. Negative for suicidal ideas, hallucinations and substance abuse. The patient is nervous/anxious and has insomnia.   All other systems reviewed and are negative.  chronic joint pain related to chronic arthritis, L toe improved compared to prior, wound seems to be closing , no current drainage visible, less erythema, no fever, no chills   Blood pressure 103/69, pulse 89, temperature 98.1 F (36.7 C), temperature source Oral, resp. rate 20, height 5' 4.7" (1.643 m), weight 106 lb (48.081 kg).Body mass index is 17.81 kg/(m^2).  General Appearance: Fairly Groomed  Patent attorney::  Good  Speech:  Normal Rate  Volume:  Normal  Mood:   Depressed, but improved compared to admission  Affect:  Constricted - vaguely irritable   Thought Process:  Linear  Orientation:  Other:  fully alert and attentive   Thought Content:  no hallucinations, no delusions, not internally preoccupied   Suicidal Thoughts:  No denies plan or intention of hurting self or of SI  Homicidal Thoughts:  No  Memory:  recent and remote grossly intact    Judgement:  Other:  improving  Insight:  Present  Psychomotor Activity:  Limited by chronic arthritis   Concentration:  Good  Recall:  good  Fund of Knowledge:Good  Language: Good  Akathisia:  Negative  Handed:  Right  AIMS (if indicated):     Assets:  Communication Skills Desire for Improvement Resilience  ADL's:   Fair   Cognition: WNL  Sleep:  Number of Hours: 7  Assessment - patient remains depressed and continues to affirm that nothing is working for her. Objectively, she appears to be slightly better and is more interactive with staff and other patients.   Treatment Plan Summary: Daily contact with patient to assess and evaluate symptoms and progress in treatment, Medication management, Plan inpatient admission and medications as below Encourage group and milieu participation to work on coping skills and symptom reduction -Continue Ambien to 10mg  qhs prn insomnia -Increase  Zoloft  To 150  mg daily for  Ongoing depression -Continue Vistaril 25mg  tid prn anxiety -Continue Keflex 500mg  bid for foot infection -Continue Mobic 15mg  daily for chronic pain -Continue Ativan 0.5mg  q6h prn severe anxiety - Consult PT and OT to help establish optimal level of services after discharge- at this time patient's intent is to return home and continue to live as  Independently as possible   Nehemiah Massed,  MD  09/14/2015, 11:30 AM

## 2015-09-14 NOTE — [facility] Group Notes (Signed)
[facility] Group Notes:  (Nursing/MHT/Case Management/Adjunct)  Date:  09/14/2015  Time:  10:33 AM  Type of Therapy:  Psychoeducational Skills  Participation Level:  Did Not Attend  Participation Quality:  N/A    Affect:  N/A  Cognitive:  N/A  Insight:  None    Engagement in Group:  N/A  Modes of Intervention:  N/A      N/A  Summary of Progress/Problems:   Did not attend  Arsenio Loader 09/14/2015, 10:33 AM

## 2015-09-14 NOTE — [facility] Suicide Risk Assessment (Signed)
[facility] INPATIENT:  Family/Significant Other Suicide Prevention Education  Suicide Prevention Education:  Contact Attempts: Elder Cyphers, has been identified by the patient as the family member/significant other with whom the patient will be residing, and identified as the person(s) who will aid the patient in the event of a mental health crisis.  With written consent from the patient, two attempts were made to provide suicide prevention education, prior to and/or following the patient's discharge.  We were unsuccessful in providing suicide prevention education.  A suicide education pamphlet was given to the patient to share with family/significant other.  Date and time of first attempt: 09/13/15 @ 1:10pm Date and time of second attempt: 09/14/15 @ 1:05pm  Elaina Hoops 09/14/2015, 1:07 PM

## 2015-09-14 NOTE — [facility] Group Notes (Signed)
[facility] LCSW Group Therapy 09/14/2015 1:15 PM  Type of Therapy: Group Therapy- Feelings about Diagnosis  Participation Level: Active   Participation Quality:  Appropriate  Affect:  Appropriate  Cognitive: Alert and Oriented   Insight:  Developing   Engagement in Therapy: Developing/Improving and Engaged   Modes of Intervention: Clarification, Confrontation, Discussion, Education, Exploration, Limit-setting, Orientation, Problem-solving, Rapport Building, Dance movement psychotherapist, Socialization and Support  Description of Group:   This group will allow patients to explore their thoughts and feelings about diagnoses they have received. Patients will be guided to explore their level of understanding and acceptance of these diagnoses. Facilitator will encourage patients to process their thoughts and feelings about the reactions of others to their diagnosis, and will guide patients in identifying ways to discuss their diagnosis with significant others in their lives. This group will be process-oriented, with patients participating in exploration of their own experiences as well as giving and receiving support and challenge from other group members.  Summary of Progress/Problems:  Pt participates appropriately in group discussion. She identifies struggling with depression for many years. She described her frustration with two of her sisters who do not have understanding or sympathy for her struggle with depression.  Therapeutic Modalities:   Cognitive Behavioral Therapy Solution Focused Therapy Motivational Interviewing Relapse Prevention Therapy  Chad Cordial, LCSWA 09/14/2015 2:44 PM

## 2015-09-14 NOTE — Progress Notes (Signed)
Patient ID: Tara Brown, female   DOB: July 28, 1961, 55 y.o.   MRN: [id number]   D  ---  Pt. Complains of arthritis pain and is medicated as ordered.  She is quiet, pleasant and receptive to staff and peers.   She has god eye contact and agrees to contract for safety at this time.   Pt. ambulates the unit without assistance but does have an unsteady gait.    She did not attend AM group, she stayed in her room.   Pt. Accepts meds  As asked and shows no sign of adverse effects.   ---- A --  Support and encouragement provided.  ---  R =---  Pt. Remains safe on unit

## 2015-09-14 NOTE — Progress Notes (Signed)
Adult Psychoeducational Group Note  Date:  09/14/2015 Time:  10:04 PM  Group Topic/Focus:  Wrap-Up Group:   The focus of this group is to help patients review their daily goal of treatment and discuss progress on daily workbooks.  Participation Level:  Active  Participation Quality:  Appropriate  Affect:  Appropriate  Cognitive:  Alert  Insight: Appropriate  Engagement in Group:  Engaged  Modes of Intervention:  Discussion  Additional Comments:  Patient stated having an okay day. Patient stated she slept a lot. Patient stated something positive that happened today was her finally being able to get evaluated for home health care.   Luan Urbani L Fawne Hughley 09/14/2015, 10:04 PM

## 2015-09-14 NOTE — Progress Notes (Signed)
Recreation Therapy Notes  Animal-Assisted Activity (AAA) Program Checklist/Progress Notes Patient Eligibility Criteria Checklist & Daily Group note for Rec Tx Intervention  Date: 01.03.2017 Time: 2:45pm Location: [address]    AAA/T Program Assumption of Risk Form signed by Patient/ or Parent Legal Guardian yes  Patient is free of allergies or sever asthma yes  Patient reports no fear of animals yes  Patient reports no history of cruelty to animals yes  Patient understands his/her participation is voluntary yes  Patient washes hands before animal contact yes  Patient washes hands after animal contact yes  Behavioral Response: Appropriate   Education: Hand Washing, Appropriate Animal Interaction   Education Outcome: Acknowledges education.   Clinical Observations/Feedback: Patient engaged appropriately with therapy dog and peers during session. Additionally shared stories about her pets at home with group.   [name] Derran Sear, LRT/CTRS  Arijana Narayan L 09/14/2015 2:04 PM

## 2015-09-15 MED ORDER — PANTOPRAZOLE SODIUM 20 MG PO TBEC
20.0000 mg | DELAYED_RELEASE_TABLET | Freq: Every day | ORAL | Status: DC
  Administered 2015-09-15 – 2015-09-17 (×3): 20 mg via ORAL
  Filled 2015-09-15 (×6): qty 1

## 2015-09-15 MED ORDER — ZOLPIDEM TARTRATE 5 MG PO TABS
5.0000 mg | ORAL_TABLET | Freq: Every evening | ORAL | Status: DC | PRN
  Administered 2015-09-15: 5 mg via ORAL
  Filled 2015-09-15: qty 1

## 2015-09-15 NOTE — Therapy (Signed)
Pt needs HHOT and HHPT upon DC for home safety and home modification.  [place], [place] [phone]

## 2015-09-15 NOTE — Progress Notes (Signed)
Patient's status remains fairly unchanged. States her depression is not lessening. Rates it as a 6/10 along with hopelessness at a 6/10. Anxiety is at a 4/10. Affect flat, sad, mood depressed. Pain rated at a 5/10, receiving ultram prn and mobic scheduled. Forwards minimal information. Medicated per orders. Support, reassurance offered. She denies SI/HI and remains safe on level III obs. [place]

## 2015-09-15 NOTE — [facility] Group Notes (Signed)
[facility] LCSW Aftercare Discharge Planning Group Note  09/15/2015 8:45 AM  Participation Quality: Alert, Appropriate and Oriented  Mood/Affect: Flat  Depression Rating: 6-7  Anxiety Rating: 6-7  Thoughts of Suicide: "not here"  Will you contract for safety? Yes  Current AVH: Pt denies  Plan for Discharge/Comments: Pt attended discharge planning group and actively participated in group. CSW discussed suicide prevention education with the group and encouraged them to discuss discharge planning and any relevant barriers. Pt reports that she is not SI "in the hospital" but is not sure about her safety when she leaves the hospital. Pt continues to have flat affect.  Transportation Means: Pt reports access to transportation  Supports: No supports mentioned at this time  Chad Cordial, LCSWA 09/15/2015 9:51 AM

## 2015-09-15 NOTE — Progress Notes (Signed)
Recreation Therapy Notes  Date: 01.04.2017 Time: 9:30am Location: [address]   Group Topic: Stress Management  Goal Area(s) Addresses:  Patient will actively participate in stress management techniques presented during session.   Behavioral Response: Did not attend.   Akshath Mccarey L Ayman Brull, LRT/CTRS        Sadae Arrazola L 09/15/2015 12:49 PM 
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
## 2015-09-15 NOTE — Progress Notes (Signed)
Patient ID: Tara Brown, female   DOB: [birth year], 55 y.o.   MRN: [id number] PER STATE REGULATIONS 482.30  THIS CHART WAS REVIEWED FOR MEDICAL NECESSITY WITH RESPECT TO THE PATIENT'S ADMISSION/ DURATION OF STAY.  NEXT REVIEW DATE: 09/19/2015  Willa Rough, RN, BSN CASE MANAGER

## 2015-09-15 NOTE — [facility] Group Notes (Signed)
[facility] LCSW Group Therapy 09/15/2015 1:15 PM  Type of Therapy: Group Therapy- Emotion Regulation  Pt did not attend, declined invitation.    Chad Cordial, LCSWA 09/15/2015 3:18 PM

## 2015-09-15 NOTE — Plan of Care (Signed)
Problem: Alteration in mood Goal: STG-Patient reports thoughts of self-harm to staff Outcome: Progressing Patient denying SI, thoughts to self harm.  Problem: Diagnosis: Increased Risk For Suicide Attempt Goal: STG-Patient Will Comply With Medication Regime Outcome: Progressing Patient is med compliant.

## 2015-09-15 NOTE — Progress Notes (Signed)
Adult Psychoeducational Group Note  Date:  09/15/2015 Time:  10:08 PM  Group Topic/Focus:  Wrap-Up Group:   The focus of this group is to help patients review their daily goal of treatment and discuss progress on daily workbooks.  Participation Level:  Active  Participation Quality:  Appropriate  Affect:  Appropriate  Cognitive:  Alert  Insight: Appropriate  Engagement in Group:  Engaged  Modes of Intervention:  Discussion  Additional Comments:  Patient stated her goal for today was to talked with her doctor about a discharge plan. Patient rated her day as a 6.   Jessie Schrieber L Zakry Caso 09/15/2015, 10:08 PM

## 2015-09-15 NOTE — Progress Notes (Signed)
  D: Pt still has flat affect and appears to be depressed. Pt informed the writer that she's planning to discharged tomorrow. Informed Clinical research associate that she's scheduled for a OT and PT consult. Pt has no other questions or concerns.    A:  Support and encouragement was offered. 15 min checks continued for safety.  R: Pt remains safe.
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
## 2015-09-15 NOTE — [facility] Suicide Risk Assessment (Signed)
[facility] INPATIENT:  Family/Significant Other Suicide Prevention Education  Suicide Prevention Education:  Education Completed; Elder Cyphers, Pt's sister [phone],  has been identified by the patient as the family member/significant other with whom the patient will be residing, and identified as the person(s) who will aid the patient in the event of a mental health crisis (suicidal ideations/suicide attempt).  With written consent from the patient, the family member/significant other has been provided the following suicide prevention education, prior to the and/or following the discharge of the patient.  The suicide prevention education provided includes the following:  Suicide risk factors  Suicide prevention and interventions  National Suicide Hotline telephone number  [facility] assessment telephone number  [facility] Emergency Assistance 911  [facility] and/or Residential Mobile Crisis Unit telephone number  Request made of family/significant other to:  Remove weapons (e.g., guns, rifles, knives), all items previously/currently identified as safety concern.    Remove drugs/medications (over-the-counter, prescriptions, illicit drugs), all items previously/currently identified as a safety concern.  The family member/significant other verbalizes understanding of the suicide prevention education information provided.  The family member/significant other agrees to remove the items of safety concern listed above.  Elaina Hoops 09/15/2015, 8:38 AM

## 2015-09-15 NOTE — Progress Notes (Addendum)
Patient ID: Ellora Varnum, female   DOB: Feb 15, 1961, 55 y.o.   MRN: [id number] [facility] MD Progress Note  09/15/2015  Yarelis Ambrosino  MRN:  [id number] Subjective:   Reports feeling depressed, but endorses some slight improvement.  Denies medication side effects.  Objective: Pt seen and case discussed with treatment team . Remains depressed, sad , intermittently tearful when discussing her stressors, but reports feeling more determined and comfortable with her decisions- states she has decided she is going to return home after discharge rather than trying to get to an ALF. States this is important because she has a Emergency planning/management officer and because she wants to retain her independence as much as possible. She is hopeful that she will be able to get home health visits for assistance  . At this time denies suicidal ideations, and as above, at present seems more future oriented. Denies medication side effects.  Toe infection/drainage has improved- on keflex. Visible in day room, going to some groups, no disruptive behaviors on unit . Presents with some irritability, " frustration", about recent  back and forth admissions between psychiatric unit and medical unit, where she was recently admitted to follow/ manage  for toe infection, which as mentioned is currently clinically improved. Responsive to support, empathy.   Principal Problem: MDD (major depressive disorder), recurrent episode, severe (HCC) Diagnosis:   Patient Active Problem List   Diagnosis Date Noted  . MDD (major depressive disorder), recurrent episode, severe (HCC) [F33.2] 09/11/2015  . Cellulitis of toe, left [L03.032]   . Chronic osteomyelitis (HCC) [M86.60] 09/07/2015  . Stage III chronic kidney disease [N18.3] 08/13/2015  . Anemia, iron deficiency [D50.9]   . Essential hypertension [I10]   . Major depressive disorder, recurrent, severe without psychotic features (HCC) [F33.2] 03/11/2015  . Anemia of chronic disease [D63.8] 03/08/2015   Total  Time spent with patient:  20 minutes    Past Medical History:  Past Medical History  Diagnosis Date  . Major depressive disorder, recurrent, severe without psychotic features (HCC) 03/11/2015    Past Surgical History  Procedure Laterality Date  . Hip fracture surgery    . Knee surgery    . Foot surgery    . Hand surgery    . Neck surgery     Family History:  Family History  Problem Relation Age of Onset  . Stroke Neg Hx     Social History:  History  Alcohol Use  . Yes    Comment: Rarely     History  Drug Use No    Social History   Social History  . Marital Status: Divorced    Spouse Name: N/A  . Number of Children: N/A  . Years of Education: N/A   Social History Main Topics  . Smoking status: Never Smoker   . Smokeless tobacco: Never Used  . Alcohol Use: Yes     Comment: Rarely  . Drug Use: No  . Sexual Activity: No   Other Topics Concern  . None   Social History Narrative   Additional Social History:   Sleep:  Improved   Appetite:   Fair   Current Medications: Current Facility-Administered Medications  Medication Dose Route Frequency Provider Last Rate Last Dose  . cephALEXin (KEFLEX) capsule 500 mg  500 mg Oral BID Beau Fanny, FNP   500 mg at 09/15/15 0905  . cholecalciferol (VITAMIN D) tablet 400 Units  400 Units Oral Daily Earney Navy, NP   400 Units at 09/15/15 0905  .  docusate sodium (COLACE) capsule 100 mg  100 mg Oral Daily Sanjuana Kava, NP   100 mg at 09/15/15 0905  . ferrous sulfate tablet 325 mg  325 mg Oral BID WC Sanjuana Kava, NP   325 mg at 09/15/15 0906  . folic acid (FOLVITE) tablet 1 mg  1 mg Oral Daily Earney Navy, NP   1 mg at 09/15/15 8466  . hydrOXYzine (ATARAX/VISTARIL) tablet 25 mg  25 mg Oral Q6H PRN Beau Fanny, FNP      . lidocaine (LIDODERM) 5 % 2 patch  2 patch Transdermal Q24H Sanjuana Kava, NP   2 patch at 09/14/15 1657  . lisinopril (PRINIVIL,ZESTRIL) tablet 40 mg  40 mg Oral Daily Earney Navy, NP   40 mg at 09/15/15 0905  . LORazepam (ATIVAN) tablet 0.5 mg  0.5 mg Oral Q6H PRN Beau Fanny, FNP   0.5 mg at 09/14/15 1255  . meloxicam (MOBIC) tablet 15 mg  15 mg Oral Daily Beau Fanny, FNP   15 mg at 09/15/15 0905  . sertraline (ZOLOFT) tablet 150 mg  150 mg Oral Daily Craige Cotta, MD   150 mg at 09/15/15 0908  . traMADol (ULTRAM) tablet 100 mg  100 mg Oral Q6H PRN Earney Navy, NP   100 mg at 09/15/15 5993  . zolpidem (AMBIEN) tablet 10 mg  10 mg Oral QHS PRN Beau Fanny, FNP   10 mg at 09/14/15 2112    Lab Results:  No results found for this or any previous visit (from the past 48 hour(s)).  Physical Findings: AIMS: Facial and Oral Movements Muscles of Facial Expression: None, normal Lips and Perioral Area: None, normal Jaw: None, normal Tongue: None, normal,Extremity Movements Upper (arms, wrists, hands, fingers): None, normal Lower (legs, knees, ankles, toes): None, normal, Trunk Movements Neck, shoulders, hips: None, normal, Overall Severity Severity of abnormal movements (highest score from questions above): None, normal Incapacitation due to abnormal movements: None, normal Patient's awareness of abnormal movements (rate only patient's report): No Awareness, Dental Status Current problems with teeth and/or dentures?: No Does patient usually wear dentures?: No  CIWA:  CIWA-Ar Total: 1 COWS:  COWS Total Score: 1  Musculoskeletal: Strength & Muscle Tone: within normal limits- range of motion  Affected by arthritis  Gait & Station: normal Patient leans: N/A  Psychiatric Specialty Exam: Review of Systems  Musculoskeletal: Positive for myalgias, back pain and joint pain.  Psychiatric/Behavioral: Positive for depression. Negative for suicidal ideas, hallucinations and substance abuse. The patient is nervous/anxious and has insomnia.   All other systems reviewed and are negative.  chronic joint pain related to chronic arthritis, L toe improved  compared to prior, wound seems to be closing , no current drainage visible, less erythema, no fever, no chills   Blood pressure 102/71, pulse 96, temperature 97.6 F (36.4 C), temperature source Oral, resp. rate 16, height 5' 4.7" (1.643 m), weight 106 lb (48.081 kg).Body mass index is 17.81 kg/(m^2).  General Appearance:  Improved grooming   Eye Contact::  Good  Speech:  Normal Rate  Volume:  Normal  Mood:   Depressed- gradual improvement of mood compared to admission  Affect:  Constricted - does smile at times appropriately  Thought Process:  Linear  Orientation:  Other:  fully alert and attentive   Thought Content:  no hallucinations, no delusions, not internally preoccupied   Suicidal Thoughts:  No denies plan or intention of hurting self or  of SI  Homicidal Thoughts:  No  Memory:  recent and remote grossly intact   Judgement:  Other:  improving  Insight:  Present  Psychomotor Activity:  Limited by chronic arthritis   Concentration:  Good  Recall:  good  Fund of Knowledge:Good  Language: Good  Akathisia:  Negative  Handed:  Right  AIMS (if indicated):     Assets:  Communication Skills Desire for Improvement Resilience  ADL's:   Fair   Cognition: WNL  Sleep:  Number of Hours: 5  Assessment - Patient remains depressed, but there is some improvement - she appears more future oriented, more self assured of her decision to work on returning home rather than going to an ALF , and expressing relief that her toe infection is now clearly improving . Tolerating medications well at this time, no side effects reported. We have reviewed potential side effects/ drug drug interactions, such as increased risk of serotonin syndrome on Ultram/Zoloft.  Treatment Plan Summary: Daily contact with patient to assess and evaluate symptoms and progress in treatment, Medication management, Plan inpatient admission and medications as below Encourage group and milieu participation to work on coping skills  and symptom reduction - Decrease  Ambien to 5 mg qhs prn insomnia -Continue  Zoloft   150  mg daily for  Ongoing depression -Continue Vistaril 25mg  tid prn anxiety -Continue Keflex 500mg  bid for foot infection -Continue Mobic 15mg  daily for chronic pain  Continue Ultram PRNs for pain, as needed  -Continue Ativan 0.5mg  q6h prn severe anxiety - Consult PT and OT to help establish optimal level of services after discharge- at this time patient's intent is to return home and continue to live as  Independently as possible   ,  MD  09/15/2015, 11:30 AM

## 2015-09-16 MED ORDER — ZOLPIDEM TARTRATE 10 MG PO TABS
10.0000 mg | ORAL_TABLET | Freq: Every evening | ORAL | Status: DC | PRN
  Administered 2015-09-16: 10 mg via ORAL
  Filled 2015-09-16: qty 1

## 2015-09-16 NOTE — Progress Notes (Signed)
Patient ID: Tara Brown, female   DOB: September 23, 1960, 55 y.o.   MRN: [id number] D: Client visible on unit, seen in dayroom watching TV. Client reports depression "6" of 10, says day has been "better" A: Writer provided emotional support, reviewed and administered medications. Client will monitor q59min for safety. R: Client is safe on the unit, attended group.

## 2015-09-16 NOTE — Progress Notes (Signed)
Patient remains flat, depressed in affect with congruent mood. Rates depression, hopelessness both at a 7/10, anxiety at a 5/10. Generalized aches (chronic) at a 7/10. States her goal today is discharge planning and she is anxious to speak with SW. Forwards minimal information. States she has not seen any improvement in her depression related to meds, specifically zoloft. Medicated per orders, ultram given for pain. Support and reassurance offered. Fall precautions reviewed and in place. Denies SI/HI and remains safe on level III obs. Lawrence [place]se[place]

## 2015-09-16 NOTE — [facility] Group Notes (Signed)
[facility] Group Therapy 09/16/2015 1:15pm  Type of Therapy: [facility] Presentation  Pt came to group but was pulled out to work with occupational therapy.  Chad Cordial, LCSWA 09/16/2015 1:30 PM

## 2015-09-16 NOTE — Plan of Care (Signed)
Problem: Alteration in mood Goal: STG-Patient is able to discuss feelings and issues (Patient is able to discuss feelings and issues leading to depression)  Outcome: Not Progressing Patient forwards minimal information.  Problem: Diagnosis: Increased Risk For Suicide Attempt Goal: STG-Patient Will Report Suicidal Feelings to Staff Outcome: Progressing Patient denies SI

## 2015-09-16 NOTE — Progress Notes (Signed)
Adult Psychoeducational Group Note  Date:  09/16/2015 Time:  9:59 PM  Group Topic/Focus:  Wrap-Up Group:   The focus of this group is to help patients review their daily goal of treatment and discuss progress on daily workbooks.  Participation Level:  Active  Participation Quality:  Appropriate  Affect:  Appropriate  Cognitive:  Alert  Insight: Appropriate  Engagement in Group:  Engaged  Modes of Intervention:  Discussion  Additional Comments:  Patient stated having an okay day. Patient stated she is ready for discharge tomorrow.  Andrez Lieurance L Marien Manship 09/16/2015, 9:59 PM

## 2015-09-16 NOTE — Plan of Care (Signed)
Problem: Alteration in mood Goal: LTG-Pt's behavior demonstrates decreased signs of depression (Patient's behavior demonstrates decreased signs of depression to the point the patient is safe to return home and continue treatment in an outpatient setting)  Outcome: Progressing Client demonstrates decreased signs of depression AEB discussing plans for rehab which will help to prepare her for discharge, also reports depression "6" of 10.

## 2015-09-16 NOTE — Progress Notes (Signed)
Occupational Therapy Treatment Patient Details Name: Tara Brown MRN: [id number] DOB: 07-02-1961 Today's Date: 09/16/2015    History of present illness 55 year old divorced female. Patient reports worsening depression in the context of increased difficulties in daily functioning and being able to live independently, due to ongoing progression of her chronic /debilitating psoriatic arthritis. Has been evaluated for  toe with extruding pin that is found  not to be urgent.   OT comments  pts nurse has her AE and will provide upon DC  Follow Up Recommendations  Home health OT              Mobility Bed Mobility Overal bed mobility: Independent                Transfers Overall transfer level: Independent                        ADL Overall ADL's : Needs assistance/impaired           Upper Body Bathing Details (indicate cue type and reason): max A washing hair as well as turning on her faucets.  Pt has been told apartment may A with changing our faucets to a kind the patient can manage             Toilet Transfer: Minimal assistance   Toileting- Clothing Manipulation and Hygiene: Independent         General ADL Comments: Pt educated and practiced with AE including adjustable spoon, reacher, sock aid and LH sponge, as well as a u-cuff to A with self feeding or holding toothbrush.  Pt feels all these adaptations will be useful as her grip is very decreased from her arthritis.  Pts shoulders are also a limiting factor with washing her hair- but tshe is going to try the LH sponge.   Pt will also benefit from  [facility] to A with adaptatiions in her home to increase her abiillity to be  I         Perception     Praxis      Cognition   Behavior During Therapy: WFL for tasks assessed/performed Overall Cognitive Status: Within Functional Limits for tasks assessed                               General Comments      Pertinent Vitals/ Pain        Pain Assessment: No/denies pain  Home Living                                          Prior Functioning/Environment              Frequency       Progress Toward Goals  OT Goals(current goals can now be found in the care plan section)  Progress towards OT goals: Progressing toward goals     Plan Discharge plan remains appropriate    Co-evaluation                 End of Session     Activity Tolerance Patient tolerated treatment well   Patient Left Other (comment) (pt went back to group)   Nurse Communication      Functional Assessment Tool Used: clinical observation Functional Limitation: Self care Self Care Current Status (F6433): At least 20 percent  but less than 40 percent impaired, limited or restricted Self Care Goal Status (O1157): At least 1 percent but less than 20 percent impaired, limited or restricted   Time: 1312-1346 OT Time Calculation (min): 34 min  Charges: OT G-codes **NOT FOR INPATIENT CLASS** Functional Assessment Tool Used: clinical observation Functional Limitation: Self care Self Care Current Status (W6203): At least 20 percent but less than 40 percent impaired, limited or restricted Self Care Goal Status (T5974): At least 1 percent but less than 20 percent impaired, limited or restricted OT General Charges $OT Visit: 1 Procedure OT Treatments $Self Care/Home Management : 23-37 mins  Criss Pallone, [place] Kung 09/16/2015, 1:55 PM

## 2015-09-16 NOTE — Progress Notes (Signed)
Patient ID: Tara Brown, female   DOB: [birth year], 55 y.o.   MRN: [id number] [facility] MD Progress Note  09/16/2015  Michaela Broski  MRN:  [id number] Subjective:  Today feeling a little better, which she attributes to being evaluated by PT and OT, giving her a sense that " things are moving faster " ( insofar as disposition planning) As noted, she plans to return  Home, with home health services if approved . States that family member/sister has encouraged her to consider going to an ALF instead, but she feels comfortable with her decision to try to return to more independent life style, and also states that her pet cat is a factor in her decision to return home at this time. Denies medication side effects. She is planning on continuing to see her outpatient podiatrist, in order to get elective surgery for hardware removal from her toe .   Objective: Pt seen and case discussed with treatment team . Patient presents less depressed, today with a fuller range of affect. She is focused on disposition plans as above- as discussed with staff, OT/PT evals / recommendations being reviewed in order to continue process for home health aide services . Patient denies suicidal ideations, at this time more future oriented. No disruptive or agitated behaviors on unit. Going to groups, visible in milieu.   Principal Problem: MDD (major depressive disorder), recurrent episode, severe (HCC) Diagnosis:   Patient Active Problem List   Diagnosis Date Noted  . MDD (major depressive disorder), recurrent episode, severe (HCC) [F33.2] 09/11/2015  . Cellulitis of toe, left [L03.032]   . Chronic osteomyelitis (HCC) [M86.60] 09/07/2015  . Stage III chronic kidney disease [N18.3] 08/13/2015  . Anemia, iron deficiency [D50.9]   . Essential hypertension [I10]   . Major depressive disorder, recurrent, severe without psychotic features (HCC) [F33.2] 03/11/2015  . Anemia of chronic disease [D63.8] 03/08/2015   Total Time  spent with patient:  20 minutes    Past Medical History:  Past Medical History  Diagnosis Date  . Major depressive disorder, recurrent, severe without psychotic features (HCC) 03/11/2015    Past Surgical History  Procedure Laterality Date  . Hip fracture surgery    . Knee surgery    . Foot surgery    . Hand surgery    . Neck surgery     Family History:  Family History  Problem Relation Age of Onset  . Stroke Neg Hx     Social History:  History  Alcohol Use  . Yes    Comment: Rarely     History  Drug Use No    Social History   Social History  . Marital Status: Divorced    Spouse Name: N/A  . Number of Children: N/A  . Years of Education: N/A   Social History Main Topics  . Smoking status: Never Smoker   . Smokeless tobacco: Never Used  . Alcohol Use: Yes     Comment: Rarely  . Drug Use: No  . Sexual Activity: No   Other Topics Concern  . None   Social History Narrative   Additional Social History:   Sleep:  Improved   Appetite:   Fair   Current Medications: Current Facility-Administered Medications  Medication Dose Route Frequency Provider Last Rate Last Dose  . cephALEXin (KEFLEX) capsule 500 mg  500 mg Oral BID Beau Fanny, FNP   500 mg at 09/16/15 0817  . cholecalciferol (VITAMIN D) tablet 400 Units  400 Units Oral Daily Josephine C  Onuoha, NP   400 Units at 09/16/15 0817  . docusate sodium (COLACE) capsule 100 mg  100 mg Oral Daily Sanjuana Kava, NP   100 mg at 09/16/15 0817  . ferrous sulfate tablet 325 mg  325 mg Oral BID WC Sanjuana Kava, NP   325 mg at 09/16/15 1717  . folic acid (FOLVITE) tablet 1 mg  1 mg Oral Daily Earney Navy, NP   1 mg at 09/16/15 0817  . hydrOXYzine (ATARAX/VISTARIL) tablet 25 mg  25 mg Oral Q6H PRN Beau Fanny, FNP      . lidocaine (LIDODERM) 5 % 2 patch  2 patch Transdermal Q24H Sanjuana Kava, NP   2 patch at 09/16/15 1559  . lisinopril (PRINIVIL,ZESTRIL) tablet 40 mg  40 mg Oral Daily Earney Navy,  NP   40 mg at 09/16/15 0817  . LORazepam (ATIVAN) tablet 0.5 mg  0.5 mg Oral Q6H PRN Beau Fanny, FNP   0.5 mg at 09/16/15 1414  . meloxicam (MOBIC) tablet 15 mg  15 mg Oral Daily Beau Fanny, FNP   15 mg at 09/16/15 0817  . pantoprazole (PROTONIX) EC tablet 20 mg  20 mg Oral Daily Craige Cotta, MD   20 mg at 09/16/15 0816  . sertraline (ZOLOFT) tablet 150 mg  150 mg Oral Daily Craige Cotta, MD   150 mg at 09/16/15 0816  . traMADol (ULTRAM) tablet 100 mg  100 mg Oral Q6H PRN Earney Navy, NP   100 mg at 09/16/15 1416  . zolpidem (AMBIEN) tablet 10 mg  10 mg Oral QHS PRN Craige Cotta, MD        Lab Results:  No results found for this or any previous visit (from the past 48 hour(s)).  Physical Findings: AIMS: Facial and Oral Movements Muscles of Facial Expression: None, normal Lips and Perioral Area: None, normal Jaw: None, normal Tongue: None, normal,Extremity Movements Upper (arms, wrists, hands, fingers): None, normal Lower (legs, knees, ankles, toes): None, normal, Trunk Movements Neck, shoulders, hips: None, normal, Overall Severity Severity of abnormal movements (highest score from questions above): None, normal Incapacitation due to abnormal movements: None, normal Patient's awareness of abnormal movements (rate only patient's report): No Awareness, Dental Status Current problems with teeth and/or dentures?: No Does patient usually wear dentures?: No  CIWA:  CIWA-Ar Total: 1 COWS:  COWS Total Score: 1  Musculoskeletal: Strength & Muscle Tone: within normal limits- range of motion  Affected by arthritis  Gait & Station: normal Patient leans: N/A  Psychiatric Specialty Exam: Review of Systems  Musculoskeletal: Positive for myalgias, back pain and joint pain.  Psychiatric/Behavioral: Positive for depression. Negative for suicidal ideas, hallucinations and substance abuse. The patient is nervous/anxious and has insomnia.   All other systems reviewed and  are negative.  chronic joint pain related to chronic arthritis, L toe improved compared to prior, wound seems to be closing , no current drainage visible, less erythema, no fever, no chills   Blood pressure 104/71, pulse 86, temperature 97.6 F (36.4 C), temperature source Oral, resp. rate 18, height 5' 4.7" (1.643 m), weight 106 lb (48.081 kg).Body mass index is 17.81 kg/(m^2).  General Appearance:  Improved grooming   Eye Contact::  Good  Speech:  Normal Rate  Volume:  Normal  Mood:    Less depressed today  Affect:  Less Constricted - does smile at times appropriately  Thought Process:  Linear  Orientation:  Other:  fully  alert and attentive   Thought Content:  no hallucinations, no delusions, not internally preoccupied   Suicidal Thoughts:  No denies plan or intention of hurting self or of SI  Homicidal Thoughts:  No  Memory:  recent and remote grossly intact   Judgement:  Other:  improving  Insight:  Present  Psychomotor Activity:  Limited by chronic arthritis   Concentration:  Good  Recall:  good  Fund of Knowledge:Good  Language: Good  Akathisia:  Negative  Handed:  Right  AIMS (if indicated):     Assets:  Communication Skills Desire for Improvement Resilience  ADL's:   Improved   Cognition: WNL  Sleep:  Number of Hours: 6.25  Assessment -  Patient is improving compared to admission, and in particular feels more hopeful after evaluation from PT/OT. She is hoping for discharge home soon, with Home health services. She is tolerating medications well- denies side effects at present. Of note, did not sleep as well on lower Ambien dose, requests to go back up to 10 mgrs QHS - had no side effects/ well tolerated  Treatment Plan Summary: Daily contact with patient to assess and evaluate symptoms and progress in treatment, Medication management, Plan inpatient admission and medications as below Encourage group and milieu participation to work on coping skills and symptom  reduction - Increase  Ambien to 10 mg qhs prn insomnia -Continue  Zoloft   150  mg daily for  Ongoing depression -Continue Vistaril 25mg  tid prn anxiety -Continue Keflex 500mg  bid for foot infection -Continue Mobic 15mg  daily for chronic pain  Continue Ultram PRNs for pain, as needed  -Continue Ativan 0.5mg  q6h prn severe anxiety - Treatment team working on disposition planning as above  Nehemiah Massed,  MD  09/16/2015, 11:30 AM

## 2015-09-16 NOTE — Progress Notes (Signed)
Patient ID: Tara Brown, female   DOB: 1960/10/19, 55 y.o.   MRN: [id number] D: client seen in dayroom watching TV. Client affect is brighter, smiling, reports working with OT/PT. Client reports she will have PT at home upon discharge. A: Writer encouraged client to follow through with discharge plans. Medications reviewed and administered as ordered. Staff will monitor q70min for safety. R: Client is safe on the unit, attended group.

## 2015-09-17 DIAGNOSIS — F332 Major depressive disorder, recurrent severe without psychotic features: Secondary | ICD-10-CM | POA: Insufficient documentation

## 2015-09-17 MED ORDER — MELOXICAM 15 MG PO TABS: 15.0000 mg | ORAL_TABLET | Freq: Every day | ORAL | Status: DC

## 2015-09-17 MED ORDER — PANTOPRAZOLE SODIUM 20 MG PO TBEC: 20.0000 mg | DELAYED_RELEASE_TABLET | Freq: Every day | ORAL | Status: DC

## 2015-09-17 MED ORDER — VITAMIN D 400 UNIT/ML PO LIQD: 1.0000 | Freq: Every day | ORAL | Status: DC

## 2015-09-17 MED ORDER — FERROUS SULFATE 325 (65 FE) MG PO TABS: 325.0000 mg | ORAL_TABLET | Freq: Every day | ORAL | Status: DC

## 2015-09-17 MED ORDER — CEPHALEXIN 500 MG PO CAPS: 500.0000 mg | ORAL_CAPSULE | Freq: Two times a day (BID) | ORAL | Status: DC

## 2015-09-17 MED ORDER — LORAZEPAM 0.5 MG PO TABS: 0.5000 mg | ORAL_TABLET | Freq: Four times a day (QID) | ORAL | Status: DC | PRN

## 2015-09-17 MED ORDER — ZOLPIDEM TARTRATE 10 MG PO TABS: 10.0000 mg | ORAL_TABLET | Freq: Every evening | ORAL | Status: DC | PRN

## 2015-09-17 MED ORDER — LIDOCAINE 5 % EX PTCH: 2.0000 | MEDICATED_PATCH | CUTANEOUS | Status: DC

## 2015-09-17 MED ORDER — DOCUSATE SODIUM 100 MG PO CAPS: 100.0000 mg | ORAL_CAPSULE | Freq: Every day | ORAL | Status: DC

## 2015-09-17 MED ORDER — FOLIC ACID 1 MG PO TABS: 1.0000 mg | ORAL_TABLET | Freq: Every day | ORAL | Status: DC

## 2015-09-17 MED ORDER — LISINOPRIL 40 MG PO TABS: 40.0000 mg | ORAL_TABLET | Freq: Every day | ORAL | Status: DC

## 2015-09-17 MED ORDER — HYDROXYZINE HCL 25 MG PO TABS: 25.0000 mg | ORAL_TABLET | Freq: Four times a day (QID) | ORAL | Status: DC | PRN

## 2015-09-17 MED ORDER — SERTRALINE HCL 50 MG PO TABS: 150.0000 mg | ORAL_TABLET | Freq: Every day | ORAL | Status: DC

## 2015-09-17 MED ORDER — TRAMADOL HCL 50 MG PO TABS: ORAL_TABLET | ORAL | Status: DC

## 2015-09-17 NOTE — Progress Notes (Signed)
D) Pt is being discharged to home accompanied by her friend. Mood and affect are appropriate. Pt denies SI and HI. Pt states that she is hopeful that she will be able to maintain herself at home. Denies SI and HI. Rates her depression a 5, hopelessness at a 5 and her anxiety at a 4. A) All belongings returned to Pt. Pt provided with her discharge plans and all questions answered. All medications explained to Pt and prescriptions given. R) Pt denies SI and HI

## 2015-09-17 NOTE — BHH Suicide Risk Assessment (Signed)
[facility] Discharge Suicide Risk Assessment   Demographic Factors:  Caucasian  Total Time spent with patient: 20 minutes  Musculoskeletal: Strength & Muscle Tone: affected by arthritis Gait & Station: affected by arthritis Patient leans: affected by arthritis  Psychiatric Specialty Exam: Physical Exam  Review of Systems  Constitutional: Negative.   Respiratory: Negative.   Cardiovascular: Negative.   Gastrointestinal: Negative.   Genitourinary: Negative.   Musculoskeletal: Positive for myalgias, back pain, joint pain and neck pain.  Skin: Negative.   Neurological: Negative.   Endo/Heme/Allergies: Negative.   Psychiatric/Behavioral: Positive for depression. The patient is nervous/anxious.     Blood pressure 105/66, pulse 90, temperature 98.3 F (36.8 C), temperature source Oral, resp. rate 20, height 5' 4.7" (1.643 m), weight 48.081 kg (106 lb).Body mass index is 17.81 kg/(m^2).  General Appearance: Fairly Groomed  Patent attorney::  Fair  Speech:  Clear and Coherent409  Volume:  Normal  Mood:  Euthymic  Affect:  Appropriate  Thought Process:  Coherent and Goal Directed  Orientation:  Full (Time, Place, and Person)  Thought Content:  plans as she moves on   Suicidal Thoughts:  No  Homicidal Thoughts:  No  Memory:  Immediate;   Fair Recent;   Fair Remote;   Fair  Judgement:  Fair  Insight:  Present  Psychomotor Activity:  Normal  Concentration:  Fair  Recall:  [employer] of Knowledge:Fair  Language: Fair  Akathisia:  No  Handed:  Right  AIMS (if indicated):     Assets:  Desire for Improvement Housing Resilience  Sleep:  Number of Hours: 2.75  Cognition: WNL  ADL's:  Intact      Has this patient used any form of tobacco in the last 30 days? (Cigarettes, Smokeless Tobacco, Cigars, and/or Pipes) No  Mental Status Per Nursing Assessment::   On Admission:  Self-harm thoughts  Current Mental Status by Physician: In full contact with reality. There are no active SI  plans or intent. She is going to continue follow up in [facility]. She is going to pursue surgery for her foot once she gets out   Loss Factors: Decline in physical health  Historical Factors: NA  Risk Reduction Factors:   Positive social support  Continued Clinical Symptoms:  Depression:   Insomnia Chronic Pain Medical Diagnoses and Treatments/Surgeries  Cognitive Features That Contribute To Risk:  Closed-mindedness, Polarized thinking and Thought constriction (tunnel vision)    Suicide Risk:  Minimal: No identifiable suicidal ideation.  Patients presenting with no risk factors but with morbid ruminations; may be classified as minimal risk based on the severity of the depressive symptoms  Principal Problem: MDD (major depressive disorder), recurrent episode, severe [facility]) Discharge Diagnoses:  Patient Active Problem List   Diagnosis Date Noted  . MDD (major depressive disorder), recurrent episode, severe (HCC) [F33.2] 09/11/2015  . Cellulitis of toe, left [L03.032]   . Chronic osteomyelitis (HCC) [M86.60] 09/07/2015  . Stage III chronic kidney disease [N18.3] 08/13/2015  . Anemia, iron deficiency [D50.9]   . Essential hypertension [I10]   . Major depressive disorder, recurrent, severe without psychotic features (HCC) [F33.2] 03/11/2015  . Anemia of chronic disease [D63.8] 03/08/2015    Follow-up Information    Follow up with [facility]  On 09/20/2015.   Why:  at 9:00am for your hospital discharge appointment. Please ask for Thelma Barge when you arrive.   Contact information:   [address] [place], [place] [address] Phone: [phone] Fax: [phone]  Plan Of Care/Follow-up recommendations:  Activity:  as tolerated Diet:  regular Follow up [facility] as above Is patient on multiple antipsychotic therapies at discharge:  No   Has Patient had three or more failed trials of antipsychotic monotherapy by history:  No  Recommended Plan for Multiple  Antipsychotic Therapies: NA    Nero Sawatzky A 09/17/2015, 12:39 PM

## 2015-09-17 NOTE — Discharge Summary (Signed)
Physician Discharge Summary Note  Patient:  Tara Brown is an 55 y.o., female MRN:  [id number] DOB:  1960-09-30 Patient phone:  [phone] (home)  Patient address:   [address] [place] [address],  Total Time spent with patient: Greater than 30 minutes  Date of Admission:  09/11/2015 Date of Discharge: 09-17-15  Reason for Admission: Worsening symptoms of depression  Principal Problem: MDD (major depressive disorder), recurrent episode, severe [place])  Discharge Diagnoses: Patient Active Problem List   Diagnosis Date Noted  . Severe episode of recurrent major depressive disorder, without psychotic features ([place]) [F33.2]   . MDD (major depressive disorder), recurrent episode, severe ([place]) [F33.2] 09/11/2015  . Cellulitis of toe, left [L03.032]   . Chronic osteomyelitis ([place]) [M86.60] 09/07/2015  . Stage III chronic kidney disease [N18.3] 08/13/2015  . Anemia, iron deficiency [D50.9]   . Essential hypertension [I10]   . Major depressive disorder, recurrent, severe without psychotic features ([place]) [F33.2] 03/11/2015  . Anemia of chronic disease [D63.8] 03/08/2015   Past Psychiatric History: Major depression  Past Medical History:  Past Medical History  Diagnosis Date  . Major depressive disorder, recurrent, severe without psychotic features ([place]) 03/11/2015    Past Surgical History  Procedure Laterality Date  . Hip fracture surgery    . Knee surgery    . Foot surgery    . Hand surgery    . Neck surgery     Family History:  Family History  Problem Relation Age of Onset  . Stroke Neg Hx    Family Psychiatric  History: See H&P  Social History:  History  Alcohol Use  . Yes    Comment: Rarely     History  Drug Use No    Social History   Social History  . Marital Status: Divorced    Spouse Name: N/A  . Number of Children: N/A  . Years of Education: N/A   Social History Main Topics  . Smoking status: Never Smoker   . Smokeless tobacco:  Never Used  . Alcohol Use: Yes     Comment: Rarely  . Drug Use: No  . Sexual Activity: No   Other Topics Concern  . None   Social History Narrative   Hospital Course: Tara Brown is a 55 year old frail Caucasian female. Admitted too [facility] from the [facility] with complaints of worsening symptoms of depression on 09-02-15. During the course of her hospitalization, Tara Brown presented with infected metal plate to left foot. She has severe history of Rheumatoid arthritiswhich has in parts caused her deformity of her entire hands/other areas. She was sent to the [facility] for surgical removal of the plate. After her hospital evaluation, Tara Brown states that the MD decided that she will rather remove the plate on an outpatient basis & was discharged back to the [facility] to complete treatment for depression. Tara Brown sees a Art therapist Dr. Carron Brazen in [place], [place]. Will schedule an appointment as soon as she is discharged from [facility] for the removal of the metal plate. During this assessment, Tara Brown reports, "I'm still feeling some depression without suicidal thoughts. I would like to resume my prednisone 10 mg & Lidocaine patch for my back pains. Would like to be evaluated by PT/OT prior to my discharge". Tara Brown rates depression at #6. Denies anxiety symptoms. She is currently on antibiotic therapy for her infection.  Tara Brown was re-admitted to the hospital to complete her treatment for worsening symptoms of depression possibly related  to chronic health issues. She is currently dealing with severe case of Rheumatoid arthritis that has rendered her with some deformity to her hand areas.  After this re-admission assessment, her presenting symptoms were identified again. The medication regimen targeting those symptoms were resumed as she was already on these medications prior to her transfer to the [facility] medical unit. She was medicated & discharged on; Sertraline 50 mg for  depression, Hydroxyzine 25 mg for anxiety, Lorazepam 1 mg for severe anxiety & Ambien 10 mg for insomnia. Her other pre-existing medical problems were identified & monitored adequately. She was resumed on all her pertinent home medications for those health issues. She was enrolled & participated in the group counseling sessions being offered & held on this unit. She learned coping skills.  During the course of her hospitalization, Tara Brown's improvement was monitored by observation & her daily report of symptom reduction noted.  Emotional and mental status was monitored by daily self-inventory reports completed by her & the clinical staff. She was evaluated by the treatment team for stability and plans for continued recovery after discharge. Tara Brown's motivation was an integral factor in her mood stability. She was offered further treatment options upon discharge on an outpatient basis as noted below to help maintain mood stability.   Upon discharge, Tara Brown was both mentally and medically stable. She denies suicidal/homicidal ideations, auditory/visual/tactile hallucinations, delusional thoughts or paranoia. She left [facility] with all belongings in no apparent distress. Transportation per her friend.  Physical Findings:  AIMS: Facial and Oral Movements Muscles of Facial Expression: None, normal Lips and Perioral Area: None, normal Jaw: None, normal Tongue: None, normal,Extremity Movements Upper (arms, wrists, hands, fingers): None, normal Lower (legs, knees, ankles, toes): None, normal, Trunk Movements Neck, shoulders, hips: None, normal, Overall Severity Severity of abnormal movements (highest score from questions above): None, normal Incapacitation due to abnormal movements: None, normal Patient's awareness of abnormal movements (rate only patient's report): No Awareness, Dental Status Current problems with teeth and/or dentures?: No Does patient usually wear dentures?: No  CIWA:  CIWA-Ar Total:  1 COWS:  COWS Total Score: 1  Musculoskeletal: Strength & Muscle Tone: within normal limits Gait & Station: normal Patient leans: N/A  Psychiatric Specialty Exam: Review of Systems  Constitutional: Negative.   HENT: Negative.   Eyes: Negative.   Respiratory: Negative.   Cardiovascular: Negative.   Gastrointestinal: Negative.   Genitourinary: Negative.   Musculoskeletal: Positive for myalgias and joint pain.       Hand/finger deformity R/T Rheumatoid arthritis. Metal plate to left lower extremity.  Skin: Negative.   Neurological: Negative.   Endo/Heme/Allergies: Negative.   Psychiatric/Behavioral: Positive for depression (Stable). Negative for suicidal ideas, hallucinations, memory loss and substance abuse. The patient has insomnia (Stable). The patient is not nervous/anxious.     Blood pressure 105/66, pulse 90, temperature 98.3 F (36.8 C), temperature source Oral, resp. rate 20, height 5' 4.7" (1.643 m), weight 48.081 kg (106 lb).Body mass index is 17.81 kg/(m^2).  See Md's SRA      Has this patient used any form of tobacco in the last 30 days? (Cigarettes, Smokeless Tobacco, Cigars, and/or Pipes) Yes, No  Metabolic Disorder Labs:  No results found for: HGBA1C, MPG No results found for: PROLACTIN No results found for: CHOL, TRIG, HDL, CHOLHDL, VLDL, LDLCALC  See Psychiatric Specialty Exam and Suicide Risk Assessment completed by Attending Physician prior to discharge.  Discharge destination:  Home  Is patient on multiple antipsychotic therapies at discharge:  No  Has Patient had three or more failed trials of antipsychotic monotherapy by history:  No  Recommended Plan for Multiple Antipsychotic Therapies: NA    Medication List    STOP taking these medications        CIMZIA PREFILLED 2 X 200 MG/ML Kit  Generic drug:  Certolizumab Pegol      TAKE these medications      Indication   cephALEXin 500 MG capsule  Commonly known as:  KEFLEX  Take 1 capsule (500  mg total) by mouth 2 (two) times daily. For infection   Indication:  Infection     cephALEXin 500 MG capsule  Commonly known as:  KEFLEX  Take 1 capsule (500 mg total) by mouth 2 (two) times daily. For infection   Indication:  Infection     docusate sodium 100 MG capsule  Commonly known as:  COLACE  Take 1 capsule (100 mg total) by mouth daily. (May purchase from over the counter at your local pharmacy): For constipation   Indication:  Constipation     ferrous sulfate 325 (65 FE) MG tablet  Take 1 tablet (325 mg total) by mouth daily with breakfast. For low Iron   Indication:  Iron Deficiency     folic acid 1 MG tablet  Commonly known as:  FOLVITE  Take 1 tablet (1 mg total) by mouth daily. For low Folate   Indication:  Deficiency of Folic Acid in the Diet     hydrOXYzine 25 MG tablet  Commonly known as:  ATARAX/VISTARIL  Take 1 tablet (25 mg total) by mouth every 6 (six) hours as needed (MILD anxiety).   Indication:  Anxiety     lidocaine 5 %  Commonly known as:  LIDODERM  Place 2 patches onto the skin daily. Remove & Discard patch within 12 hours or as directed by MD: For pain management   Indication:  Pain management     lisinopril 40 MG tablet  Commonly known as:  PRINIVIL,ZESTRIL  Take 1 tablet (40 mg total) by mouth daily. For high blood pressure   Indication:  High Blood Pressure     LORazepam 0.5 MG tablet  Commonly known as:  ATIVAN  Take 1 tablet (0.5 mg total) by mouth every 6 (six) hours as needed for anxiety.   Indication:  Anxiety     meloxicam 15 MG tablet  Commonly known as:  MOBIC  Take 1 tablet (15 mg total) by mouth daily. For arthritis pain   Indication:  Joint Damage causing Pain and Loss of Function, Rheumatoid Arthritis     pantoprazole 20 MG tablet  Commonly known as:  PROTONIX  Take 1 tablet (20 mg total) by mouth daily. For acid reflux   Indication:  Gastroesophageal Reflux Disease     sertraline 50 MG tablet  Commonly known as:  ZOLOFT   Take 3 tablets (150 mg total) by mouth daily. For depression   Indication:  Major Depressive Disorder     traMADol 50 MG tablet  Commonly known as:  ULTRAM  Take 100 mg by mouth twice daily as needed for pain   Indication:  Moderate to Moderately Severe Pain     Vitamin D 400 UNIT/ML Liqd  Take 1 tablet by mouth daily. For bone health   Indication:  Bone health     zolpidem 10 MG tablet  Commonly known as:  AMBIEN  Take 1 tablet (10 mg total) by mouth at bedtime as needed for sleep.   Indication:  Trouble Sleeping       Follow-up Information    Follow up with [facility]  On 09/20/2015.   Why:  at 9:00am for your hospital discharge appointment. Please ask for Dub Mikes when you arrive.   Contact information:   [address] [place], [place] [address] Phone: [phone] Fax: [phone]      Follow up with [facility].   Specialty:  Home Health Services   Why:  FOR PT,OT,NA AND NURSE, THEY WILL CONTACT YOU AT DISCHARGE, PLEASE BE MINDFUL OF OFFICE CLOSINGS DUE TO INCLEMENT WEATHER   Contact information:   [place] [phone]      Follow-up recommendations: Activity:  As tolerated Diet: As recommended by your primary care doctor. Keep all scheduled follow-up appointments as recommended.    Comments: Take all your medications as prescribed by your mental healthcare provider. Report any adverse effects and or reactions from your medicines to your outpatient provider promptly. Patient is instructed and cautioned to not engage in alcohol and or illegal drug use while on prescription medicines. In the event of worsening symptoms, patient is instructed to call the crisis hotline, 911 and or go to the nearest ED for appropriate evaluation and treatment of symptoms. Follow-up with your primary care provider for your other medical issues, concerns and or health care needs.   Signed: Encarnacion Slates, PMHNP,  FNP-BC 09/20/2015, 11:51 AM  I agree with assessment and plan [place] Chen. Sabra Heck, M.D.

## 2015-09-17 NOTE — Tx Team (Signed)
Interdisciplinary Treatment Plan Update (Adult) Date: 09/17/2015   Date: 09/17/2015 1:06 PM  Progress in Treatment:  Attending groups: Yes  Participating in groups: Yes  Taking medication as prescribed: Yes  Tolerating medication: Yes  Family/Significant othe contact made: Yes, with sister Patient understands diagnosis: Yes AEB seeking help with depression Discussing patient identified problems/goals with staff: Yes  Medical problems stabilized or resolved: Yes  Denies suicidal/homicidal ideation: Yes Patient has not harmed self or Others: Yes   New problem(s) identified: None identified at this time.   Discharge Plan or Barriers: Home with in-home aid v. ALF placement.   09/17/15: Pt will discharge with home health and follow-up with [facility]. Nurse CM arranged home health referral  Additional comments:  Patient and CSW reviewed pt's identified goals and treatment plan. Patient verbalized understanding and agreed to treatment plan. CSW reviewed [facility] "Discharge Process and Patient Involvement" Form. Pt verbalized understanding of information provided and signed form.   Reason for Continuation of Hospitalization:  Anxiety Depression Medication stabilization Suicidal ideation  Estimated length of stay: 0 days; Pt stable for DC  Review of initial/current patient goals per problem list:   1.  Goal(s): Patient will participate in aftercare plan  Met:  Yes  Target date: 3-5 days from date of admission   As evidenced by: Patient will participate within aftercare plan AEB aftercare provider and housing plan at discharge being identified.   09/13/15: CSW is assessing for appropriate plan. Home with in-home aid v. ALF placement. Pt will need referral for outpatient services  09/17/15: Pt will discharge with home health and follow-up with [facility]. Nurse CM arranged home health referral  2.  Goal (s): Patient will exhibit decreased depressive symptoms and suicidal ideations.  Met:  Adequate for  DC  Target date: 3-5 days from date of admission   As evidenced by: Patient will utilize self rating of depression at 3 or below and demonstrate decreased signs of depression or be deemed stable for discharge by MD. 09/13/15: Pt rates depression at 6/10 and presents with flat and withdrawn affect. Denies SI 09/17/15: Pt rates depression at 5/10; MD feels that Pt's symptoms have decreased to the point that they can be managed in an outpatient setting.   3.  Goal(s): Patient will demonstrate decreased signs and symptoms of anxiety.  Met:  Adequate for DC  Target date: 3-5 days from date of admission   As evidenced by: Patient will utilize self rating of anxiety at 3 or below and demonstrated decreased signs of anxiety, or be deemed stable for discharge by MD  09/13/15: Pt rates anxiety at 6/10; affect congruent.  09/17/15: Pt rates anxiety at 5/10; MD feels that Pt's symptoms have decreased to the point that they can be managed in an outpatient setting.   Attendees:  Patient:    Family:    Physician: Dr. Sabra Heck, MD  09/17/2015 1:06 PM  Nursing: Lars Pinks, RN Case manager  09/17/2015 1:06 PM  Clinical Social Worker Peri Maris, [place] 09/17/2015 1:06 PM  Other: Tilden Fossa, LCSWA 09/17/2015 1:06 PM  Clinical: Sandre Kitty, Bryson Dames, RN 09/17/2015 1:06 PM  Other: , RN Charge Nurse 09/17/2015 1:06 PM  Other:     Peri Maris, [place] Social Work [phone]

## 2015-09-17 NOTE — Progress Notes (Signed)
  [facility] Adult Case Management Discharge Plan :  Will you be returning to the same living situation after discharge:  Yes,  Pt returning home At discharge, do you have transportation home?: Yes,  friend to provide transportation Do you have the ability to pay for your medications: Yes,  Pt provided with prescriptions  Release of information consent forms completed and in the chart;  Patient's signature needed at discharge.  Patient to Follow up at: Follow-up Information    Follow up with [facility]  On 09/20/2015.   Why:  at 9:00am for your hospital discharge appointment. Please ask for Thelma Barge when you arrive.   Contact information:   [address] [place], [place] [address] Phone: [phone] Fax: [phone]      Next level of care provider has access to [facility] Link:no  Safety Planning and Suicide Prevention discussed: Yes,  with sister; see SPE note for further details     Has patient been referred to the Quitline?: N/A patient is not a smoker  Patient has been referred for addiction treatment: N/A  Tara Brown 09/17/2015, 1:09 PM

## 2015-10-22 NOTE — Discharge Summary (Signed)
Inpatient Psychiatric Unit Discharge Summary -  08/12/15.  Patient is a 55 year old female who presented to the hospital due to worsening depression, sadness, neuro-vegetative symptoms of depression and passive suicidal ideations. She had been facing worsening stressors, mainly decreasing ability to function in her regular activities, and more difficulty living independently due to severity of psoriatic arthritis. She was admitted with a diagnosis of Major Depression, Severe , no Psychotic Features . She was started on REMERON for depression, and to help with sleep and appetite improvement . Patient had an open , draining wound on toe, and had a prior history of toe surgery/hardware placement. Due to this chronic infection, Hospitalist Service was requested and work up was initiated to rule out osteomyelitis. It was felt by consultant that patient required inpatient medical admission to adequately treat her infection, and to obtain appropriate orthopedic consultation and assistance . As such she was discharged from inpatient psychiatric unit, for admission at [facility] medical unit. Discharge Diagnosis- Major Depression, Recurrent, Severe.  Chronic Toe infection, possible Osteomyelitis, Psoriatic Arthitis. Discharge /Disposition plan- inpatient medical treatment at [facility]. Psychiatric consultant available  for ongoing psychiatric recommendations As needed .    Nehemiah Massed MD

## 2015-12-05 ENCOUNTER — Inpatient Hospital Stay (HOSPITAL_BASED_OUTPATIENT_CLINIC_OR_DEPARTMENT_OTHER)
Admission: EM | Admit: 2015-12-05 | Discharge: 2015-12-14 | DRG: 464 | Disposition: A | Discharge status: Home / Self Care | Payer: Medicare Other | Attending: Internal Medicine | Admitting: Internal Medicine

## 2015-12-05 ENCOUNTER — Telehealth (HOSPITAL_COMMUNITY): Payer: Self-pay

## 2015-12-05 ENCOUNTER — Emergency Department (HOSPITAL_BASED_OUTPATIENT_CLINIC_OR_DEPARTMENT_OTHER): Payer: Medicare Other

## 2015-12-05 ENCOUNTER — Encounter (HOSPITAL_BASED_OUTPATIENT_CLINIC_OR_DEPARTMENT_OTHER): Payer: Self-pay | Admitting: Emergency Medicine

## 2015-12-05 DIAGNOSIS — Z681 Body mass index (BMI) 19 or less, adult: Secondary | ICD-10-CM

## 2015-12-05 DIAGNOSIS — R197 Diarrhea, unspecified: Secondary | ICD-10-CM | POA: Diagnosis not present

## 2015-12-05 DIAGNOSIS — R45851 Suicidal ideations: Secondary | ICD-10-CM | POA: Diagnosis present

## 2015-12-05 DIAGNOSIS — M86672 Other chronic osteomyelitis, left ankle and foot: Secondary | ICD-10-CM | POA: Diagnosis present

## 2015-12-05 DIAGNOSIS — G8929 Other chronic pain: Secondary | ICD-10-CM | POA: Diagnosis not present

## 2015-12-05 DIAGNOSIS — I10 Essential (primary) hypertension: Secondary | ICD-10-CM | POA: Diagnosis present

## 2015-12-05 DIAGNOSIS — Z79899 Other long term (current) drug therapy: Secondary | ICD-10-CM | POA: Diagnosis not present

## 2015-12-05 DIAGNOSIS — L089 Local infection of the skin and subcutaneous tissue, unspecified: Secondary | ICD-10-CM

## 2015-12-05 DIAGNOSIS — M069 Rheumatoid arthritis, unspecified: Secondary | ICD-10-CM | POA: Diagnosis not present

## 2015-12-05 DIAGNOSIS — Z9114 Patient's other noncompliance with medication regimen: Secondary | ICD-10-CM

## 2015-12-05 DIAGNOSIS — G4700 Insomnia, unspecified: Secondary | ICD-10-CM | POA: Diagnosis not present

## 2015-12-05 DIAGNOSIS — Z7952 Long term (current) use of systemic steroids: Secondary | ICD-10-CM | POA: Diagnosis not present

## 2015-12-05 DIAGNOSIS — F332 Major depressive disorder, recurrent severe without psychotic features: Secondary | ICD-10-CM | POA: Diagnosis present

## 2015-12-05 DIAGNOSIS — L4050 Arthropathic psoriasis, unspecified: Secondary | ICD-10-CM | POA: Diagnosis present

## 2015-12-05 DIAGNOSIS — L03032 Cellulitis of left toe: Secondary | ICD-10-CM | POA: Diagnosis present

## 2015-12-05 DIAGNOSIS — D638 Anemia in other chronic diseases classified elsewhere: Secondary | ICD-10-CM | POA: Diagnosis present

## 2015-12-05 DIAGNOSIS — M810 Age-related osteoporosis without current pathological fracture: Secondary | ICD-10-CM | POA: Diagnosis present

## 2015-12-05 DIAGNOSIS — D509 Iron deficiency anemia, unspecified: Secondary | ICD-10-CM | POA: Diagnosis not present

## 2015-12-05 DIAGNOSIS — D519 Vitamin B12 deficiency anemia, unspecified: Secondary | ICD-10-CM | POA: Diagnosis present

## 2015-12-05 DIAGNOSIS — T380X5A Adverse effect of glucocorticoids and synthetic analogues, initial encounter: Secondary | ICD-10-CM | POA: Diagnosis present

## 2015-12-05 LAB — CBC WITH DIFFERENTIAL/PLATELET
BASOS ABS: 0 10*3/uL (ref 0.0–0.1)
Basophils Relative: 0 %
Eosinophils Absolute: 0.1 10*3/uL (ref 0.0–0.7)
Eosinophils Relative: 1 %
HEMATOCRIT: 31.1 % — AB (ref 36.0–46.0)
HEMOGLOBIN: 9.6 g/dL — AB (ref 12.0–15.0)
LYMPHS PCT: 29 %
Lymphs Abs: 2.1 10*3/uL (ref 0.7–4.0)
MCH: 23.2 pg — ABNORMAL LOW (ref 26.0–34.0)
MCHC: 30.9 g/dL (ref 30.0–36.0)
MCV: 75.3 fL — AB (ref 78.0–100.0)
MONO ABS: 0.6 10*3/uL (ref 0.1–1.0)
Monocytes Relative: 8 %
NEUTROS ABS: 4.3 10*3/uL (ref 1.7–7.7)
NEUTROS PCT: 62 %
Platelets: 345 10*3/uL (ref 150–400)
RBC: 4.13 MIL/uL (ref 3.87–5.11)
RDW: 20.3 % — AB (ref 11.5–15.5)
WBC: 7.1 10*3/uL (ref 4.0–10.5)

## 2015-12-05 LAB — BASIC METABOLIC PANEL
ANION GAP: 7 (ref 5–15)
BUN: 17 mg/dL (ref 6–20)
CO2: 23 mmol/L (ref 22–32)
Calcium: 8.8 mg/dL — ABNORMAL LOW (ref 8.9–10.3)
Chloride: 109 mmol/L (ref 101–111)
Creatinine, Ser: 0.77 mg/dL (ref 0.44–1.00)
GFR calc Af Amer: >60 mL/min (ref >60)
GLUCOSE: 79 mg/dL (ref 65–99)
POTASSIUM: 3.5 mmol/L (ref 3.5–5.1)
Sodium: 139 mmol/L (ref 135–145)

## 2015-12-05 LAB — SEDIMENTATION RATE: Sed Rate: 72 mm/hr — ABNORMAL HIGH (ref 0–22)

## 2015-12-05 MED ORDER — TETANUS-DIPHTH-ACELL PERTUSSIS 5-2.5-18.5 LF-MCG/0.5 IM SUSP
0.5000 mL | Freq: Once | INTRAMUSCULAR | Status: AC
  Administered 2015-12-05: 0.5 mL via INTRAMUSCULAR
  Filled 2015-12-05: qty 0.5

## 2015-12-05 MED ORDER — PIPERACILLIN-TAZOBACTAM 3.375 G IVPB
3.3750 g | Freq: Once | INTRAVENOUS | Status: AC
Start: 2015-12-05 — End: 2015-12-05
  Administered 2015-12-05: 3.375 g via INTRAVENOUS
  Filled 2015-12-05: qty 50

## 2015-12-05 MED ORDER — VANCOMYCIN HCL IN DEXTROSE 1-5 GM/200ML-% IV SOLN
1000.0000 mg | Freq: Once | INTRAVENOUS | Status: AC
  Administered 2015-12-05: 1000 mg via INTRAVENOUS
  Filled 2015-12-05: qty 200

## 2015-12-05 MED ORDER — SODIUM CHLORIDE 0.9 % IV SOLN
250.0000 mL | INTRAVENOUS | Status: DC | PRN
  Administered 2015-12-05: 250 mL via INTRAVENOUS

## 2015-12-05 MED ORDER — MORPHINE SULFATE (PF) 4 MG/ML IV SOLN
4.0000 mg | Freq: Once | INTRAVENOUS | Status: AC
  Administered 2015-12-05: 4 mg via INTRAVENOUS
  Filled 2015-12-05: qty 1

## 2015-12-05 MED ORDER — SODIUM CHLORIDE 0.9% FLUSH
3.0000 mL | Freq: Two times a day (BID) | INTRAVENOUS | Status: DC
  Administered 2015-12-05 – 2015-12-10 (×9): 3 mL via INTRAVENOUS
  Filled 2015-12-05: qty 3

## 2015-12-05 MED ORDER — SODIUM CHLORIDE 0.9% FLUSH
3.0000 mL | INTRAVENOUS | Status: DC | PRN
  Administered 2015-12-05: 3 mL via INTRAVENOUS
  Filled 2015-12-05: qty 3

## 2015-12-05 MED ORDER — ONDANSETRON HCL 4 MG/2ML IJ SOLN
4.0000 mg | Freq: Once | INTRAMUSCULAR | Status: AC
  Administered 2015-12-05: 4 mg via INTRAVENOUS
  Filled 2015-12-05: qty 2

## 2015-12-05 NOTE — ED Notes (Signed)
Patient transported to X-ray 

## 2015-12-05 NOTE — ED Notes (Signed)
Pending arrival of [facility], pt updated, no changes, VSS, remains polite, cooperative, calm, NAD.

## 2015-12-05 NOTE — ED Notes (Signed)
Out with [facility], [facility] staff notified of status

## 2015-12-05 NOTE — ED Provider Notes (Addendum)
CSN: [id number]     Arrival date & time 12/05/15  1544 History  By signing my name below, I, Iona Beard, attest that this documentation has been prepared under the direction and in the presence of Doug Sou, MD.   Electronically Signed: Iona Beard, ED Scribe. 12/05/2015. 7:01 PM  Chief Complaint  Patient presents with  . Fatigue    The history is provided by the patient. No language interpreter was used.   HPI Comments: Tara Brown is a 55 y.o. female with PMHx major depressive disorder and rheumatoid arthritis, and  PSHx of hip fracture surgery, knee surgery, foot surgery, hand surgery, and neck surgery who presents to the Emergency Department complaining of gradual onset, constat fatigue, onset in the past few weeks. She also reports back pain, difficulty using her arms, radiating pain in both arms, and left foot pain with hardware from a past surgery coming out of the foot. She also complains of fleeting sucidal thoughts with plan. She reports she would probably use overdose if she were to try to harm herself. She states "I don't have the means to kill myself" She says she feels overwhelmed and would like to spend some time in the hospital. Pt denies homicidal ideations, or any other pertinent symptoms. Pt is a non smoker, and denies hx of illicit drug use. Pt states she is in transition to a new PCP and new rheumatologist. No worsening or alleviating factors noted. No other pertinent symptoms noted.   Past Medical History  Diagnosis Date  . Major depressive disorder, recurrent, severe without psychotic features (HCC) 03/11/2015   Past Surgical History  Procedure Laterality Date  . Hip fracture surgery    . Knee surgery    . Foot surgery    . Hand surgery    . Neck surgery     Family History  Problem Relation Age of Onset  . Stroke Neg Hx    Social History  Substance Use Topics  . Smoking status: Never Smoker   . Smokeless tobacco: Never Used  . Alcohol Use:  Yes     Comment: Rarely   OB History    No data available     Review of Systems  Musculoskeletal: Positive for arthralgias.       Diffuse arthralgias  Skin: Positive for wound.       Hardware from prior left foot surgery exposed for past month  Allergic/Immunologic: Positive for immunocompromised state.       Chronically on prednisone  Psychiatric/Behavioral: Positive for suicidal ideas and dysphoric mood.  All other systems reviewed and are negative.    Allergies  Review of patient's allergies indicates no known allergies.  Home Medications   Prior to Admission medications   Not on File   BP 151/91 mmHg  Pulse 102  Temp(Src) 98.3 F (36.8 C) (Oral)  Resp 20  Ht 5\' 6"  (1.676 m)  Wt 111 lb (50.349 kg)  BMI 17.92 kg/m2  SpO2 98% Physical Exam  Constitutional:  Frail chronically ill-appearing, cachectic  HENT:  Head: Normocephalic and atraumatic.  Eyes: Conjunctivae are normal. Pupils are equal, round, and reactive to light.  Neck: Neck supple. No tracheal deviation present. No thyromegaly present.  Cardiovascular: Normal rate and regular rhythm.   No murmur heard. Pulmonary/Chest: Effort normal and breath sounds normal.  Abdominal: Soft. Bowel sounds are normal. She exhibits no distension. There is no tenderness.  Musculoskeletal: Normal range of motion. She exhibits no edema or tenderness.  Valgus deformities of both  hands. Left lower extremity foot there is a metal wire exposed at the first MTP joint, medial aspect with surrounding redness and thick yellow pus draining from the wound. DP pulse 2+ all other extremities without redness. Neurovascular intact  Neurological: She is alert. Coordination normal.  Skin: Skin is warm and dry. No rash noted.  Nursing note and vitals reviewed.   ED Course  Procedures (including critical care time) DIAGNOSTIC STUDIES: Oxygen Saturation is 98% on RA, normal by my interpretation.    COORDINATION OF CARE: 5:24 PM-Discussed  treatment plan which includes BMP, CBC with differential/platelet, and wound culture with pt at bedside and pt agreed to plan.   Labs Review Labs Reviewed  BASIC METABOLIC PANEL - Abnormal; Notable for the following:    Calcium 8.8 (*)    All other components within normal limits  CBC WITH DIFFERENTIAL/PLATELET - Abnormal; Notable for the following:    Hemoglobin 9.6 (*)    HCT 31.1 (*)    MCV 75.3 (*)    MCH 23.2 (*)    RDW 20.3 (*)    All other components within normal limits  WOUND CULTURE    Imaging Review Dg Foot Complete Left  12/05/2015  CLINICAL DATA:  Left great toe redness and swelling. Hardware protruding medially. EXAM: LEFT FOOT - COMPLETE 3+ VIEW COMPARISON:  09/01/2015 FINDINGS: The prosthetic component at the distal first metatarsal has subluxed for error in the dorsal/medial direction. The underlying bone shows significant resorption. Little bone contacts the hardware. There is also lucency surrounding the prosthetic component at the base of the great toe proximal phalanx. This has mildly increased from the prior study. There is resorption of the heads of the second through fifth metatarsals, most prominently the second. Second metatarsophalangeal joint is dislocated with proximal phalanx projecting dorsal to the distal second metatarsal. There are resorption zone the proximal phalanges of the fourth and fifth toes. There is partial resorption of the middle phalanx of the fourth toe. Bones are diffusely demineralized. Soft tissues are unremarkable. There is no acute fracture. IMPRESSION: 1. Loosening of the great toe first MTP joint prosthesis. There has been further subluxation of the first metatarsal head prosthetic component. There is more underlying bone resorption than there was on the prior study. 2. Other changes of forefoot erosions are stable from the prior exam. No acute fracture. Electronically Signed   By: Amie Portland M.D.   On: 12/05/2015 18:30   I have personally  reviewed and evaluated these images and lab results as part of my medical decision-making.   EKG Interpretation None     6:55 PM pain improved after treatment with intravenous morphine. Foot X-ray viewed by me Results for orders placed or performed during the hospital encounter of 12/05/15  Basic metabolic panel  Result Value Ref Range   Sodium 139 135 - 145 mmol/L   Potassium 3.5 3.5 - 5.1 mmol/L   Chloride 109 101 - 111 mmol/L   CO2 23 22 - 32 mmol/L   Glucose, Bld 79 65 - 99 mg/dL   BUN 17 6 - 20 mg/dL   Creatinine, Ser 0.93 0.44 - 1.00 mg/dL   Calcium 8.8 (L) 8.9 - 10.3 mg/dL   GFR calc non Af Amer >60 >60 mL/min   GFR calc Af Amer >60 >60 mL/min   Anion gap 7 5 - 15  CBC with Differential/Platelet  Result Value Ref Range   WBC 7.1 4.0 - 10.5 K/uL   RBC 4.13 3.87 - 5.11 MIL/uL  Hemoglobin 9.6 (L) 12.0 - 15.0 g/dL   HCT 43.6 (L) 06.7 - 70.3 %   MCV 75.3 (L) 78.0 - 100.0 fL   MCH 23.2 (L) 26.0 - 34.0 pg   MCHC 30.9 30.0 - 36.0 g/dL   RDW 40.3 (H) 52.4 - 81.8 %   Platelets 345 150 - 400 K/uL   Neutrophils Relative % 62 %   Neutro Abs 4.3 1.7 - 7.7 K/uL   Lymphocytes Relative 29 %   Lymphs Abs 2.1 0.7 - 4.0 K/uL   Monocytes Relative 8 %   Monocytes Absolute 0.6 0.1 - 1.0 K/uL   Eosinophils Relative 1 %   Eosinophils Absolute 0.1 0.0 - 0.7 K/uL   Basophils Relative 0 %   Basophils Absolute 0.0 0.0 - 0.1 K/uL   Dg Foot Complete Left  12/05/2015  CLINICAL DATA:  Left great toe redness and swelling. Hardware protruding medially. EXAM: LEFT FOOT - COMPLETE 3+ VIEW COMPARISON:  09/01/2015 FINDINGS: The prosthetic component at the distal first metatarsal has subluxed for error in the dorsal/medial direction. The underlying bone shows significant resorption. Little bone contacts the hardware. There is also lucency surrounding the prosthetic component at the base of the great toe proximal phalanx. This has mildly increased from the prior study. There is resorption of the heads of  the second through fifth metatarsals, most prominently the second. Second metatarsophalangeal joint is dislocated with proximal phalanx projecting dorsal to the distal second metatarsal. There are resorption zone the proximal phalanges of the fourth and fifth toes. There is partial resorption of the middle phalanx of the fourth toe. Bones are diffusely demineralized. Soft tissues are unremarkable. There is no acute fracture. IMPRESSION: 1. Loosening of the great toe first MTP joint prosthesis. There has been further subluxation of the first metatarsal head prosthetic component. There is more underlying bone resorption than there was on the prior study. 2. Other changes of forefoot erosions are stable from the prior exam. No acute fracture. Electronically Signed   By: Amie Portland M.D.   On: 12/05/2015 18:30    MDM  Dr. Robb Matar consulted and accepts patient in transfer to [facility], medical surgical floor. Patient will need psychiatric consult with sitter at bedside. She'll also need orthopedic consultation during her hospitalization, IV antibiotics and pain control. Note that she is immune compromised due to chronic prednisone therapy. Anemia is chronic Concern for osteomyelitis of foot given bony destruction, exposed hardware and overlying soft tissue infection Final diagnoses:  None  Diagnoses #1infection of left foot #2 suicidal ideation #3 anemia  I personally performed the services described in this documentation, which was scribed in my presence. The recorded information has been reviewed and considered.    Doug Sou, MD 12/05/15 1915  8:45 PM requesting additional pain medicine. Intravenous morphine ordered  Doug Sou, MD 12/05/15 2049

## 2015-12-05 NOTE — Progress Notes (Signed)
Patient ID: Tara Brown, female   DOB: [birth year], 55 y.o.   MRN: [id number]   Please call the floor manager at extension [phone] admitting physician assignment when the patient arrives to [facility].  Patient is a 55 year old female with a past medical history of rheumatoid arthritis and major depressive disorder who is coming with complaints of fatigue, but also expressed suicidal thoughts in the emergency department. Physical examination and workup reveals that the patient has a left foot cellulitis and subluxation of a previous MTP joint prosthesis.  She was accepted to a MedSurg bed at [facility] with a fever for treatment of cellulitis of the left foot and psychiatric evaluation.     Component Value Units   Basic metabolic panel [[id number]] (Abnormal) Collected: 12/05/15 1730   Updated: 12/05/15 1753    Specimen Type: Blood     Sodium 139 mmol/L    Potassium 3.5 mmol/L    Chloride 109 mmol/L    CO2 23 mmol/L    Glucose, Bld 79 mg/dL    BUN 17 mg/dL    Creatinine, Ser 6.81 mg/dL    Calcium 8.8 (L) mg/dL    GFR calc non Af Amer >60 mL/min    GFR calc Af Amer >60 mL/min    Anion gap 7   CBC with Differential/Platelet [[id number]] (Abnormal) Collected: 12/05/15 1730   Updated: 12/05/15 1741    Specimen Type: Blood     WBC 7.1 K/uL    RBC 4.13 MIL/uL    Hemoglobin 9.6 (L) g/dL    HCT 59.7 (L) %    MCV 75.3 (L) fL    MCH 23.2 (L) pg    MCHC 30.9 g/dL    RDW 41.6 (H) %    Platelets 345 K/uL    Neutrophils Relative % 62 %    Neutro Abs 4.3 K/uL    Lymphocytes Relative 29 %    Lymphs Abs 2.1 K/uL    Monocytes Relative 8 %    Monocytes Absolute 0.6 K/uL    Eosinophils Relative 1 %    Eosinophils Absolute 0.1 K/uL    Basophils Relative 0 %    Basophils Absolute 0.0 K/uL   Wound culture [[id number]] Collected: 12/05/15 1730   Updated: 12/05/15 1739    Specimen Type: Wound    Specimen Source: Foot, Left       IMPRESSION: 1. Loosening of the great toe first MTP joint prosthesis. There has been further subluxation of the first metatarsal head prosthetic component. There is more underlying bone resorption than there was on the prior study. 2. Other changes of forefoot erosions are stable from the prior exam. No acute fracture.   Electronically Signed  By: Amie Portland M.D.  On: 12/05/2015 18:30   Sanda Klein, M.D.

## 2015-12-05 NOTE — ED Notes (Signed)
Per H&P, pt noted to have "fleeting SI with a plan", sitter ordered with admission orders, confirmed with EDP.  [facility], primary receiving RN and AC notified. No changes, pt calm, NAD, interactive, polite and cooperative.

## 2015-12-05 NOTE — ED Notes (Signed)
Updated/clarifications made with Perfecto Kingdom at [facility].

## 2015-12-05 NOTE — ED Notes (Signed)
Pt has significant psych issues, and has not followed up with psychiatrist in last month, currently looking for new one. Pt cont. To follow up with rheumatologist for RA, reports increased fatigues, decreased po intake

## 2015-12-06 ENCOUNTER — Encounter (HOSPITAL_COMMUNITY): Payer: Self-pay | Admitting: Internal Medicine

## 2015-12-06 DIAGNOSIS — L089 Local infection of the skin and subcutaneous tissue, unspecified: Secondary | ICD-10-CM

## 2015-12-06 DIAGNOSIS — M069 Rheumatoid arthritis, unspecified: Secondary | ICD-10-CM | POA: Diagnosis present

## 2015-12-06 DIAGNOSIS — F332 Major depressive disorder, recurrent severe without psychotic features: Secondary | ICD-10-CM

## 2015-12-06 DIAGNOSIS — R45851 Suicidal ideations: Secondary | ICD-10-CM

## 2015-12-06 LAB — COMPREHENSIVE METABOLIC PANEL
ALBUMIN: 2.9 g/dL — AB (ref 3.5–5.0)
ALK PHOS: 70 U/L (ref 38–126)
ALT: 8 U/L — ABNORMAL LOW (ref 14–54)
ANION GAP: 7 (ref 5–15)
AST: 13 U/L — ABNORMAL LOW (ref 15–41)
BILIRUBIN TOTAL: 0.4 mg/dL (ref 0.3–1.2)
BUN: 18 mg/dL (ref 6–20)
CALCIUM: 8.4 mg/dL — AB (ref 8.9–10.3)
CO2: 25 mmol/L (ref 22–32)
Chloride: 110 mmol/L (ref 101–111)
Creatinine, Ser: 0.84 mg/dL (ref 0.44–1.00)
GLUCOSE: 96 mg/dL (ref 65–99)
Potassium: 3.7 mmol/L (ref 3.5–5.1)
Sodium: 142 mmol/L (ref 135–145)
TOTAL PROTEIN: 6.7 g/dL (ref 6.5–8.1)

## 2015-12-06 LAB — RETICULOCYTES
RBC.: 3.54 MIL/uL — AB (ref 3.87–5.11)
RETIC COUNT ABSOLUTE: 24.8 10*3/uL (ref 19.0–186.0)
RETIC CT PCT: 0.7 % (ref 0.4–3.1)

## 2015-12-06 LAB — CBC WITH DIFFERENTIAL/PLATELET
Basophils Absolute: 0 10*3/uL (ref 0.0–0.1)
Basophils Relative: 0 %
Eosinophils Absolute: 0.2 10*3/uL (ref 0.0–0.7)
Eosinophils Relative: 3 %
HEMATOCRIT: 27 % — AB (ref 36.0–46.0)
HEMOGLOBIN: 8.3 g/dL — AB (ref 12.0–15.0)
LYMPHS ABS: 1.8 10*3/uL (ref 0.7–4.0)
LYMPHS PCT: 33 %
MCH: 23.4 pg — AB (ref 26.0–34.0)
MCHC: 30.7 g/dL (ref 30.0–36.0)
MCV: 76.3 fL — AB (ref 78.0–100.0)
MONO ABS: 0.4 10*3/uL (ref 0.1–1.0)
MONOS PCT: 7 %
NEUTROS ABS: 3.2 10*3/uL (ref 1.7–7.7)
NEUTROS PCT: 57 %
Platelets: 265 10*3/uL (ref 150–400)
RBC: 3.54 MIL/uL — ABNORMAL LOW (ref 3.87–5.11)
RDW: 20.2 % — AB (ref 11.5–15.5)
WBC: 5.5 10*3/uL (ref 4.0–10.5)

## 2015-12-06 LAB — IRON AND TIBC
Iron: 11 ug/dL — ABNORMAL LOW (ref 28–170)
SATURATION RATIOS: 6 % — AB (ref 10.4–31.8)
TIBC: 193 ug/dL — ABNORMAL LOW (ref 250–450)
UIBC: 182 ug/dL

## 2015-12-06 LAB — FERRITIN: Ferritin: 44 ng/mL (ref 11–307)

## 2015-12-06 LAB — PHOSPHORUS: Phosphorus: 4.6 mg/dL (ref 2.5–4.6)

## 2015-12-06 LAB — VITAMIN B12: VITAMIN B 12: 150 pg/mL — AB (ref 180–914)

## 2015-12-06 LAB — FOLATE: Folate: 28.8 ng/mL (ref >5.9)

## 2015-12-06 LAB — MAGNESIUM: Magnesium: 2.1 mg/dL (ref 1.7–2.4)

## 2015-12-06 MED ORDER — TEMAZEPAM 15 MG PO CAPS
15.0000 mg | ORAL_CAPSULE | Freq: Every evening | ORAL | Status: DC | PRN
  Administered 2015-12-06 (×2): 15 mg via ORAL
  Filled 2015-12-06 (×2): qty 1

## 2015-12-06 MED ORDER — FOLIC ACID 1 MG PO TABS
1.0000 mg | ORAL_TABLET | Freq: Every day | ORAL | Status: DC
  Administered 2015-12-06 – 2015-12-14 (×9): 1 mg via ORAL
  Filled 2015-12-06 (×9): qty 1

## 2015-12-06 MED ORDER — LISINOPRIL 20 MG PO TABS
40.0000 mg | ORAL_TABLET | Freq: Every day | ORAL | Status: DC
  Administered 2015-12-06 – 2015-12-14 (×8): 40 mg via ORAL
  Filled 2015-12-06 (×9): qty 2

## 2015-12-06 MED ORDER — VITAMIN B-12 1000 MCG PO TABS
1000.0000 ug | ORAL_TABLET | Freq: Every day | ORAL | Status: DC
  Administered 2015-12-06 – 2015-12-14 (×9): 1000 ug via ORAL
  Filled 2015-12-06 (×9): qty 1

## 2015-12-06 MED ORDER — KETOROLAC TROMETHAMINE 30 MG/ML IJ SOLN
30.0000 mg | Freq: Once | INTRAMUSCULAR | Status: AC
  Administered 2015-12-06: 30 mg via INTRAVENOUS
  Filled 2015-12-06: qty 1

## 2015-12-06 MED ORDER — SERTRALINE HCL 50 MG PO TABS
150.0000 mg | ORAL_TABLET | Freq: Every day | ORAL | Status: DC
  Administered 2015-12-06 – 2015-12-09 (×4): 150 mg via ORAL
  Filled 2015-12-06 (×4): qty 1

## 2015-12-06 MED ORDER — MELOXICAM 15 MG PO TABS
15.0000 mg | ORAL_TABLET | Freq: Every day | ORAL | Status: DC
  Administered 2015-12-06 – 2015-12-14 (×8): 15 mg via ORAL
  Filled 2015-12-06 (×9): qty 1

## 2015-12-06 MED ORDER — PIPERACILLIN-TAZOBACTAM 3.375 G IVPB
3.3750 g | Freq: Three times a day (TID) | INTRAVENOUS | Status: DC
  Administered 2015-12-06 – 2015-12-09 (×10): 3.375 g via INTRAVENOUS
  Filled 2015-12-06 (×12): qty 50

## 2015-12-06 MED ORDER — ENOXAPARIN SODIUM 40 MG/0.4ML ~~LOC~~ SOLN
40.0000 mg | SUBCUTANEOUS | Status: DC
  Administered 2015-12-06 – 2015-12-14 (×8): 40 mg via SUBCUTANEOUS
  Filled 2015-12-06 (×9): qty 0.4

## 2015-12-06 MED ORDER — PANTOPRAZOLE SODIUM 40 MG PO TBEC
40.0000 mg | DELAYED_RELEASE_TABLET | Freq: Every day | ORAL | Status: DC
  Administered 2015-12-06 – 2015-12-14 (×9): 40 mg via ORAL
  Filled 2015-12-06 (×10): qty 1

## 2015-12-06 MED ORDER — VANCOMYCIN HCL 500 MG IV SOLR
500.0000 mg | Freq: Two times a day (BID) | INTRAVENOUS | Status: DC
  Administered 2015-12-06 – 2015-12-09 (×7): 500 mg via INTRAVENOUS
  Filled 2015-12-06 (×8): qty 500

## 2015-12-06 MED ORDER — ONDANSETRON HCL 4 MG PO TABS
4.0000 mg | ORAL_TABLET | Freq: Four times a day (QID) | ORAL | Status: DC | PRN
  Administered 2015-12-06: 4 mg via ORAL
  Filled 2015-12-06: qty 1

## 2015-12-06 MED ORDER — POTASSIUM CHLORIDE IN NACL 20-0.9 MEQ/L-% IV SOLN
INTRAVENOUS | Status: DC
  Administered 2015-12-06: 02:00:00 via INTRAVENOUS
  Filled 2015-12-06 (×2): qty 1000

## 2015-12-06 MED ORDER — PREDNISONE 10 MG PO TABS
10.0000 mg | ORAL_TABLET | Freq: Every day | ORAL | Status: DC
  Administered 2015-12-06 – 2015-12-14 (×9): 10 mg via ORAL
  Filled 2015-12-06 (×10): qty 1

## 2015-12-06 MED ORDER — OXYCODONE HCL 5 MG PO TABS
5.0000 mg | ORAL_TABLET | ORAL | Status: DC | PRN
  Administered 2015-12-06 (×4): 5 mg via ORAL
  Filled 2015-12-06 (×4): qty 1

## 2015-12-06 MED ORDER — FERROUS FUMARATE 324 (106 FE) MG PO TABS
1.0000 | ORAL_TABLET | Freq: Every day | ORAL | Status: DC
  Administered 2015-12-06 – 2015-12-14 (×8): 106 mg via ORAL
  Filled 2015-12-06 (×10): qty 1

## 2015-12-06 MED ORDER — CALCIUM CARBONATE-VITAMIN D 500-200 MG-UNIT PO TABS
1.0000 | ORAL_TABLET | Freq: Two times a day (BID) | ORAL | Status: DC
  Administered 2015-12-06 – 2015-12-14 (×16): 1 via ORAL
  Filled 2015-12-06 (×19): qty 1

## 2015-12-06 MED ORDER — ONDANSETRON HCL 4 MG/2ML IJ SOLN: 4.0000 mg | Freq: Four times a day (QID) | INTRAMUSCULAR | Status: DC | PRN

## 2015-12-06 MED ORDER — METHOTREXATE 2.5 MG PO TABS
7.5000 mg | ORAL_TABLET | ORAL | Status: DC
  Filled 2015-12-06 (×3): qty 3

## 2015-12-06 MED ORDER — VANCOMYCIN HCL IN DEXTROSE 750-5 MG/150ML-% IV SOLN
750.0000 mg | Freq: Two times a day (BID) | INTRAVENOUS | Status: DC
  Filled 2015-12-06: qty 150

## 2015-12-06 MED ORDER — ZOLPIDEM TARTRATE 5 MG PO TABS
5.0000 mg | ORAL_TABLET | Freq: Every evening | ORAL | Status: DC | PRN
  Administered 2015-12-06 – 2015-12-08 (×3): 5 mg via ORAL
  Filled 2015-12-06 (×3): qty 1

## 2015-12-06 MED ORDER — OXYCODONE HCL 5 MG PO TABS
5.0000 mg | ORAL_TABLET | ORAL | Status: DC | PRN
  Administered 2015-12-06 – 2015-12-08 (×5): 5 mg via ORAL
  Filled 2015-12-06 (×5): qty 1

## 2015-12-06 MED ORDER — LIP MEDEX EX OINT
TOPICAL_OINTMENT | CUTANEOUS | Status: AC
  Administered 2015-12-06: 03:00:00
  Filled 2015-12-06: qty 7

## 2015-12-06 MED ORDER — SENNOSIDES-DOCUSATE SODIUM 8.6-50 MG PO TABS: 1.0000 | ORAL_TABLET | Freq: Every evening | ORAL | Status: DC | PRN

## 2015-12-06 MED ORDER — DOCUSATE SODIUM 100 MG PO CAPS
100.0000 mg | ORAL_CAPSULE | Freq: Two times a day (BID) | ORAL | Status: DC
  Administered 2015-12-06 – 2015-12-09 (×3): 100 mg via ORAL

## 2015-12-06 NOTE — Consult Note (Signed)
[facility] Face-to-Face Psychiatry Consult   Reason for Consult:  Depression and suicide ideation Referring Physician:  Dr. Algis Liming Patient Identification: Tara Brown MRN:  [id number] Principal Diagnosis: Major depressive disorder, recurrent, severe without psychotic features [facility]) Diagnosis:   Patient Active Problem List   Diagnosis Date Noted  . Left foot infection [L08.9] 12/06/2015  . Suicidal ideations [R45.851] 12/06/2015  . Rheumatoid arthritis ([place]) [M06.9] 12/06/2015  . Foot infection [L08.9] 12/05/2015  . Severe episode of recurrent major depressive disorder, without psychotic features ([place]) [F33.2]   . MDD (major depressive disorder), recurrent episode, severe ([place]) [F33.2] 09/11/2015  . Cellulitis of toe, left [L03.032]   . Chronic osteomyelitis ([place]) [M86.60] 09/07/2015  . Anemia, iron deficiency [D50.9]   . Essential hypertension [I10]   . Major depressive disorder, recurrent, severe without psychotic features ([place]) [F33.2] 03/11/2015  . Anemia of chronic disease [D63.8] 03/08/2015    Total Time spent with patient: 1 hour  Subjective:   Tara Brown is a 55 y.o. female patient admitted with depression.  HPI:  Tara Brown is a 55 y.o. Female seen, chart reviewed and case discussed with the hospitalist. Patient has been suffering with past medical history rheumatoid arthritis, major depressive disorder, hypertension and admitted to the [facility] for increased symptoms of fatigue, loss of interest, loss of appetite, disturbed sleep over 4 weeks. Patient also reported she has been thinking about ending her life by taking intentional overdose of medication. She also reported she has no medication for the last 2-4 weeks because she cannot afford to pay the copayment. Patient also has a difficult to to access her primary care physician at [facility] who has been prescribing her medication. Patient reportedly want to change the primary care  physician and has problem with fund Center this time. Patient's sister was in [place] and patient father also supportive to her and occasionally provided limited financial support. Patient started feeling isolated, withdrawn and ruminating about worthlessness and how to end her life because of overwhelmed with the disability of rheumatoid arthritis, and psoriatic arthritis. Review of medical records indicated the patient was admitted to behavioral [place] 3 times during the 2016 and patient is willing to be placed again as she like the service is the past. Patient is willing for voluntarily admission when bed is available..  Past Psychiatric History: Major depressive disorder, recurrent and has multiple acute psychiatric hospitalization at behavioral [place].  Risk to Self: Is patient at risk for suicide?: Yes Risk to Others:   Prior Inpatient Therapy:   Prior Outpatient Therapy:    Past Medical History:  Past Medical History  Diagnosis Date  . Major depressive disorder, recurrent, severe without psychotic features ([place]) 03/11/2015    Past Surgical History  Procedure Laterality Date  . Hip fracture surgery    . Knee surgery    . Foot surgery    . Hand surgery    . Neck surgery     Family History:  Family History  Problem Relation Age of Onset  . Stroke Neg Hx    Family Psychiatric  History: Unknown Social History:  History  Alcohol Use  . Yes    Comment: Rarely     History  Drug Use No    Social History   Social History  . Marital Status: Divorced    Spouse Name: N/A  . Number of Children: N/A  . Years of Education: N/A   Social History Main Topics  . Smoking status: Never  Smoker   . Smokeless tobacco: Never Used  . Alcohol Use: Yes     Comment: Rarely  . Drug Use: No  . Sexual Activity: No   Other Topics Concern  . None   Social History Narrative   Additional Social History:    Allergies:  No Known Allergies  Labs:  Results for orders  placed or performed during the hospital encounter of 12/05/15 (from the past 48 hour(s))  Basic metabolic panel     Status: Abnormal   Collection Time: 12/05/15  5:30 PM  Result Value Ref Range   Sodium 139 135 - 145 mmol/L   Potassium 3.5 3.5 - 5.1 mmol/L   Chloride 109 101 - 111 mmol/L   CO2 23 22 - 32 mmol/L   Glucose, Bld 79 65 - 99 mg/dL   BUN 17 6 - 20 mg/dL   Creatinine, Ser 0.77 0.44 - 1.00 mg/dL   Calcium 8.8 (L) 8.9 - 10.3 mg/dL   GFR calc non Af Amer >60 >60 mL/min   GFR calc Af Amer >60 >60 mL/min    Comment: (NOTE) The eGFR has been calculated using the CKD EPI equation. This calculation has not been validated in all clinical situations. eGFR's persistently <60 mL/min signify possible Chronic Kidney Disease.    Anion gap 7 5 - 15  CBC with Differential/Platelet     Status: Abnormal   Collection Time: 12/05/15  5:30 PM  Result Value Ref Range   WBC 7.1 4.0 - 10.5 K/uL   RBC 4.13 3.87 - 5.11 MIL/uL   Hemoglobin 9.6 (L) 12.0 - 15.0 g/dL   HCT 31.1 (L) 36.0 - 46.0 %   MCV 75.3 (L) 78.0 - 100.0 fL   MCH 23.2 (L) 26.0 - 34.0 pg   MCHC 30.9 30.0 - 36.0 g/dL   RDW 20.3 (H) 11.5 - 15.5 %   Platelets 345 150 - 400 K/uL   Neutrophils Relative % 62 %   Neutro Abs 4.3 1.7 - 7.7 K/uL   Lymphocytes Relative 29 %   Lymphs Abs 2.1 0.7 - 4.0 K/uL   Monocytes Relative 8 %   Monocytes Absolute 0.6 0.1 - 1.0 K/uL   Eosinophils Relative 1 %   Eosinophils Absolute 0.1 0.0 - 0.7 K/uL   Basophils Relative 0 %   Basophils Absolute 0.0 0.0 - 0.1 K/uL  Sedimentation rate     Status: Abnormal   Collection Time: 12/05/15  5:30 PM  Result Value Ref Range   Sed Rate 72 (H) 0 - 22 mm/hr  CBC WITH DIFFERENTIAL     Status: Abnormal   Collection Time: 12/06/15  4:17 AM  Result Value Ref Range   WBC 5.5 4.0 - 10.5 K/uL   RBC 3.54 (L) 3.87 - 5.11 MIL/uL   Hemoglobin 8.3 (L) 12.0 - 15.0 g/dL   HCT 27.0 (L) 36.0 - 46.0 %   MCV 76.3 (L) 78.0 - 100.0 fL   MCH 23.4 (L) 26.0 - 34.0 pg   MCHC  30.7 30.0 - 36.0 g/dL   RDW 20.2 (H) 11.5 - 15.5 %   Platelets 265 150 - 400 K/uL   Neutrophils Relative % 57 %   Neutro Abs 3.2 1.7 - 7.7 K/uL   Lymphocytes Relative 33 %   Lymphs Abs 1.8 0.7 - 4.0 K/uL   Monocytes Relative 7 %   Monocytes Absolute 0.4 0.1 - 1.0 K/uL   Eosinophils Relative 3 %   Eosinophils Absolute 0.2 0.0 - 0.7 K/uL  Basophils Relative 0 %   Basophils Absolute 0.0 0.0 - 0.1 K/uL  Comprehensive metabolic panel     Status: Abnormal   Collection Time: 12/06/15  4:17 AM  Result Value Ref Range   Sodium 142 135 - 145 mmol/L   Potassium 3.7 3.5 - 5.1 mmol/L   Chloride 110 101 - 111 mmol/L   CO2 25 22 - 32 mmol/L   Glucose, Bld 96 65 - 99 mg/dL   BUN 18 6 - 20 mg/dL   Creatinine, Ser 0.84 0.44 - 1.00 mg/dL   Calcium 8.4 (L) 8.9 - 10.3 mg/dL   Total Protein 6.7 6.5 - 8.1 g/dL   Albumin 2.9 (L) 3.5 - 5.0 g/dL   AST 13 (L) 15 - 41 U/L   ALT 8 (L) 14 - 54 U/L   Alkaline Phosphatase 70 38 - 126 U/L   Total Bilirubin 0.4 0.3 - 1.2 mg/dL   GFR calc non Af Amer >60 >60 mL/min   GFR calc Af Amer >60 >60 mL/min    Comment: (NOTE) The eGFR has been calculated using the CKD EPI equation. This calculation has not been validated in all clinical situations. eGFR's persistently <60 mL/min signify possible Chronic Kidney Disease.    Anion gap 7 5 - 15  Magnesium     Status: None   Collection Time: 12/06/15  4:17 AM  Result Value Ref Range   Magnesium 2.1 1.7 - 2.4 mg/dL  Phosphorus     Status: None   Collection Time: 12/06/15  4:17 AM  Result Value Ref Range   Phosphorus 4.6 2.5 - 4.6 mg/dL  Vitamin B12     Status: Abnormal   Collection Time: 12/06/15  4:17 AM  Result Value Ref Range   Vitamin B-12 150 (L) 180 - 914 pg/mL    Comment: (NOTE) This assay is not validated for testing neonatal or myeloproliferative syndrome specimens for Vitamin B12 levels. Performed at [facility]   Folate     Status: None   Collection Time: 12/06/15  4:17 AM  Result Value  Ref Range   Folate 28.8 >5.9 ng/mL    Comment: Performed at [facility]  Iron and TIBC     Status: Abnormal   Collection Time: 12/06/15  4:17 AM  Result Value Ref Range   Iron 11 (L) 28 - 170 ug/dL   TIBC 193 (L) 250 - 450 ug/dL   Saturation Ratios 6 (L) 10.4 - 31.8 %   UIBC 182 ug/dL    Comment: Performed at [facility]  Ferritin     Status: None   Collection Time: 12/06/15  4:17 AM  Result Value Ref Range   Ferritin 44 11 - 307 ng/mL    Comment: Performed at [facility]  Reticulocytes     Status: Abnormal   Collection Time: 12/06/15  4:17 AM  Result Value Ref Range   Retic Ct Pct 0.7 0.4 - 3.1 %   RBC. 3.54 (L) 3.87 - 5.11 MIL/uL   Retic Count, Manual 24.8 19.0 - 186.0 K/uL    Current Facility-Administered Medications  Medication Dose Route Frequency Provider Last Rate Last Dose  . 0.9 %  sodium chloride infusion  250 mL Intravenous PRN Orlie Dakin, MD 10 mL/hr at 12/05/15 1733 250 mL at 12/05/15 1733  . 0.9 % NaCl with KCl 20 mEq/ L  infusion   Intravenous Continuous Reubin Milan, MD 75 mL/hr at 12/06/15 0220    . calcium-vitamin D (OSCAL  WITH D) 500-200 MG-UNIT per tablet 1 tablet  1 tablet Oral BID WC Reubin Milan, MD   1 tablet at 12/06/15 0804  . enoxaparin (LOVENOX) injection 40 mg  40 mg Subcutaneous Q24H Reubin Milan, MD   40 mg at 12/06/15 0945  . folic acid (FOLVITE) tablet 1 mg  1 mg Oral Daily Reubin Milan, MD   1 mg at 12/06/15 0945  . lisinopril (PRINIVIL,ZESTRIL) tablet 40 mg  40 mg Oral Daily Reubin Milan, MD   40 mg at 12/06/15 0945  . meloxicam (MOBIC) tablet 15 mg  15 mg Oral Daily Reubin Milan, MD   15 mg at 12/06/15 0945  . [START ON 12/09/2015] methotrexate (RHEUMATREX) tablet 7.5 mg  7.5 mg Oral Once per day on Thu Fri Reubin Milan, MD      . ondansetron [facility]) tablet 4 mg  4 mg Oral Q6H PRN Reubin Milan, MD   4 mg at 12/06/15 0225   Or  . ondansetron [facility]) injection 4 mg  4 mg  Intravenous Q6H PRN Reubin Milan, MD      . oxyCODONE (Oxy IR/ROXICODONE) immediate release tablet 5 mg  5 mg Oral Q4H PRN Reubin Milan, MD   5 mg at 12/06/15 0804  . pantoprazole (PROTONIX) EC tablet 40 mg  40 mg Oral Daily Reubin Milan, MD   40 mg at 12/06/15 0805  . piperacillin-tazobactam (ZOSYN) IVPB 3.375 g  3.375 g Intravenous Q8H Leann T Poindexter, RPH   3.375 g at 12/06/15 0300  . predniSONE (DELTASONE) tablet 10 mg  10 mg Oral Q breakfast Reubin Milan, MD   10 mg at 12/06/15 0804  . senna-docusate (Senokot-S) tablet 1 tablet  1 tablet Oral QHS PRN Reubin Milan, MD      . sertraline (ZOLOFT) tablet 150 mg  150 mg Oral Daily Reubin Milan, MD   150 mg at 12/06/15 0945  . sodium chloride flush (NS) 0.9 % injection 3 mL  3 mL Intravenous Q12H Orlie Dakin, MD   3 mL at 12/06/15 0945  . temazepam (RESTORIL) capsule 15 mg  15 mg Oral QHS PRN,MR X 1 Reubin Milan, MD   15 mg at 12/06/15 0332  . vancomycin (VANCOCIN) 500 mg in sodium chloride 0.9 % 100 mL IVPB  500 mg Intravenous Q12H Nilda Simmer, RPH   500 mg at 12/06/15 0803    Musculoskeletal: Strength & Muscle Tone: decreased Gait & Station: unable to stand Patient leans: N/A  Psychiatric Specialty Exam: ROS depression, anxiety, isolation, withdrawn, disturbance of sleep and appetite and suicidal ideation and pain secondary to rheumatoid and psoriatic arthritis of hands shoulders and foot. Patient denied nausea, vomiting, abdominal pain and shortness of breath or chest pain.   No Fever-chills, No Headache, No changes with Vision or hearing, reports vertigo No problems swallowing food or Liquids, No Chest pain, Cough or Shortness of Breath, No Abdominal pain, No Nausea or Vommitting, Bowel movements are regular, No Blood in stool or Urine, No dysuria, No new skin rashes or bruises, No new joints pains-aches,  No new weakness, tingling, numbness in any extremity, No recent weight  gain or loss, No polyuria, polydypsia or polyphagia,  A full 10 point Review of Systems was done, except as stated above, all other Review of Systems were negative.  Blood pressure 116/69, pulse 73, temperature 98 F (36.7 C), temperature source Oral, resp. rate 16, height 5' 6"  (1.676  m), weight 50.349 kg (111 lb), SpO2 99 %.Body mass index is 17.92 kg/(m^2).  General Appearance: Casual  Eye Contact::  Good  Speech:  Clear and Coherent  Volume:  Normal  Mood:  Anxious, Depressed and Worthless  Affect:  Congruent and Depressed  Thought Process:  Coherent and Goal Directed  Orientation:  Full (Time, Place, and Person)  Thought Content:  Rumination  Suicidal Thoughts:  Yes.  with intent/plan  Homicidal Thoughts:  No  Memory:  Immediate;   Good Recent;   Good Remote;   Good  Judgement:  Fair  Insight:  Good  Psychomotor Activity:  Decreased  Concentration:  Good  Recall:  Good  Fund of Knowledge:Good  Language: Good  Akathisia:  Negative  Handed:  Right  AIMS (if indicated):     Assets:  Communication Skills Desire for Improvement Housing Leisure Time Resilience Transportation  ADL's:  Impaired  Cognition: WNL  Sleep:      Treatment Plan Summary: Daily contact with patient to assess and evaluate symptoms and progress in treatment, Medication management and Plan Patient meet criteria for acute psychiatric hospitalization and will refer to the behavioral health Hospital as patient requested.   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 Supportive therapy provided about ongoing  stressors.  Durward Parcel., MD 12/06/2015 11:04 AM

## 2015-12-06 NOTE — Progress Notes (Signed)
Pharmacy Antibiotic Note  Tara Brown is a 55 y.o. female admitted on 12/05/2015 with cellulittis.  PMH includes rheumatoid arthritis and depression.  Presented with fatigue and suicidal ideation and upon exam pt noted to have left foot cellulitis and subluxation of joint prosthesis.  Pharmacy has been consulted for Vancomycin & Zosyn dosing.  Plan: Vancomycin 1gm IV x 1 followed by 500mg  IV every 12 hours.  Goal trough 10-15 mcg/mL. Zosyn 3.375g IV q8h (4 hour infusion).  Height: 5\' 6"  (167.6 cm) Weight: 111 lb (50.349 kg) IBW/kg (Calculated) : 59.3  Temp (24hrs), Avg:98.4 F (36.9 C), Min:98.3 F (36.8 C), Max:98.4 F (36.9 C)   Recent Labs Lab 12/05/15 1730  WBC 7.1  CREATININE 0.77    Estimated Creatinine Clearance: 62.4 mL/min (by C-G formula based on Cr of 0.77).    No Known Allergies  Antimicrobials this admission: 3/26 Vanc >>   3/27 Zosyn >>    Dose adjustments this admission:    Microbiology results: 3/26 Wound:    Thank you for allowing pharmacy to be a part of this patient's care.  4/27, PharmD 12/06/2015 2:24 AM

## 2015-12-06 NOTE — H&P (Signed)
[facility] History and Physical  Jeanee Fabre IRS:[id number] DOB: 28-Oct-1960 DOA: 12/05/2015  Referring physician: Doug Sou, M.D. PCP: Francee Gentile, MD   Chief Complaint: Fatigue.  HPI: Kynlie Jane is a 55 y.o. female with past medical history rheumatoid arthritis, major depressive disorder, hypertension who presented to the emergency department at [facility] for evaluation of fatigue.  While in the ER, the patient stated that she had not follow up with her psychiatrist in the last month, had not taken her medications in over a week, was under a lot of stress physically from her rheumatoid arthritis and financial stress, and she is states that she is overwhelmed. She did not have money to buy her medications and she was considering committing suicide, but stated that she didn't have the means to kill herself.  While being examined, it was noticed that the patient has an open wound draining purulent material on her left big toe. An x-ray of the area showed loosening of the great toe first MTP joint prosthesis and underlying bone resorption. She is being admitted for treatment of this and evaluation by psychiatry.    Review of Systems:  Constitutional:  Positive fatigue. No weight loss, night sweats, Fevers, chills  HEENT:  No headaches, Difficulty swallowing,Tooth/dental problems,Sore throat,  No sneezing, itching, ear ache, nasal congestion, post nasal drip,  Cardio-vascular:  No chest pain, Orthopnea, PND, swelling in lower extremities, anasarca, dizziness, palpitations  GI:  No heartburn, indigestion, abdominal pain, nausea, vomiting, diarrhea, change in bowel habits, loss of appetite  Resp:  No shortness of breath with exertion or at rest. No excess mucus, no productive cough, No non-productive cough, No coughing up of blood.No change in color of mucus.No wheezing.No chest wall deformity  Skin:  no rash or lesions.  GU:  no dysuria, change in  color of urine, no urgency or frequency. No flank pain.  Musculoskeletal:  Positive for arthralgias, joint deformity, decreased range of motion of multiple joints, particularly MCP joints bilaterally. Psych:  Positive suicidal ideations, depression, anxiety and insomnia.  Past Medical History  Diagnosis Date  . Major depressive disorder, recurrent, severe without psychotic features (HCC) 03/11/2015   Past Surgical History  Procedure Laterality Date  . Hip fracture surgery    . Knee surgery    . Foot surgery    . Hand surgery    . Neck surgery     Social History:  reports that she has never smoked. She has never used smokeless tobacco. She reports that she drinks alcohol. She reports that she does not use illicit drugs.  No Known Allergies  Family History  Problem Relation Age of Onset  . Stroke Neg Hx     Prior to Admission medications   Medication Sig Start Date End Date Taking? Authorizing Provider  calcium-vitamin D (OSCAL WITH D) 500-200 MG-UNIT tablet Take 1 tablet by mouth 2 (two) times daily.   Yes Historical Provider, MD  fluocinonide cream (LIDEX) 0.05 % Apply 1 application topically 2 (two) times daily as needed (for psorasis).   Yes Historical Provider, MD  folic acid (FOLVITE) 1 MG tablet Take 1 mg by mouth daily. 10/12/15  Yes Historical Provider, MD  lisinopril (PRINIVIL,ZESTRIL) 40 MG tablet Take 40 mg by mouth daily. 11/26/14  Yes Historical Provider, MD  meloxicam (MOBIC) 15 MG tablet Take 15 mg by mouth daily. 09/18/15  Yes Historical Provider, MD  methotrexate (RHEUMATREX) 2.5 MG tablet Take 7.5 mg by mouth 2 (two) times a week. Thursday and  Friday 10/12/15  Yes Historical Provider, MD  predniSONE (DELTASONE) 10 MG tablet Take 10 mg by mouth daily. 10/12/15  Yes Historical Provider, MD  sertraline (ZOLOFT) 50 MG tablet Take 150 mg by mouth daily. 09/18/15  Yes Historical Provider, MD  traMADol (ULTRAM) 50 MG tablet Take 50 mg by mouth every 6 (six) hours as needed. For  pain. 11/12/15  Yes Historical Provider, MD  zolpidem (AMBIEN) 10 MG tablet Take 10 mg by mouth at bedtime as needed. For sleep. 11/10/15  Yes Historical Provider, MD   Physical Exam: Filed Vitals:   12/05/15 2030 12/05/15 2100 12/05/15 2130 12/05/15 2301  BP: 106/67 108/65 112/63 135/60  Pulse: 75 63 71 71  Temp:    98.4 F (36.9 C)  TempSrc:    Oral  Resp: 16   16  Height:      Weight:      SpO2: 96% 99% 97% 100%    Wt Readings from Last 3 Encounters:  12/05/15 50.349 kg (111 lb)  09/11/15 48.081 kg (106 lb)  09/07/15 46.267 kg (102 lb)    General:  Appears calm and comfortable. Eyes: PERRL, normal lids, irises & conjunctiva ENT: grossly normal hearing, lips & tongue Neck: no LAD, masses or thyromegaly Cardiovascular: RRR, no m/r/g. No LE pitting edema. Telemetry: N/A Respiratory: CTA bilaterally, no w/r/r. Normal respiratory effort. Abdomen: soft, ntnd Skin: no rash or induration seen on limited exam Musculoskeletal: Positive left foot big toe wound, with purulent discharge, surrounding erythema and edema and tenderness to palpation. Positive swelling and deformity of the MCP joints. Psychiatric: Mood is depressed, speech fluent and appropriate Neurologic: Awake, alert, oriented  4, grossly non-focal.          Labs on Admission:  Basic Metabolic Panel:  Recent Labs Lab 12/05/15 1730  NA 139  K 3.5  CL 109  CO2 23  GLUCOSE 79  BUN 17  CREATININE 0.77  CALCIUM 8.8*   CBC:  Recent Labs Lab 12/05/15 1730  WBC 7.1  NEUTROABS 4.3  HGB 9.6*  HCT 31.1*  MCV 75.3*  PLT 345    Radiological Exams on Admission: Dg Foot Complete Left  12/05/2015  CLINICAL DATA:  Left great toe redness and swelling. Hardware protruding medially. EXAM: LEFT FOOT - COMPLETE 3+ VIEW COMPARISON:  09/01/2015 FINDINGS: The prosthetic component at the distal first metatarsal has subluxed for error in the dorsal/medial direction. The underlying bone shows significant resorption. Little  bone contacts the hardware. There is also lucency surrounding the prosthetic component at the base of the great toe proximal phalanx. This has mildly increased from the prior study. There is resorption of the heads of the second through fifth metatarsals, most prominently the second. Second metatarsophalangeal joint is dislocated with proximal phalanx projecting dorsal to the distal second metatarsal. There are resorption zone the proximal phalanges of the fourth and fifth toes. There is partial resorption of the middle phalanx of the fourth toe. Bones are diffusely demineralized. Soft tissues are unremarkable. There is no acute fracture. IMPRESSION: 1. Loosening of the great toe first MTP joint prosthesis. There has been further subluxation of the first metatarsal head prosthetic component. There is more underlying bone resorption than there was on the prior study. 2. Other changes of forefoot erosions are stable from the prior exam. No acute fracture. Electronically Signed   By: Amie Portland M.D.   On: 12/05/2015 18:30      Assessment/Plan Principal Problem:   Left foot infection   Chronic  osteomyelitis (HCC)   Cellulitis of toe, left Admit to MedSurg/inpatient. Analgesics as needed. Local wound care. Continue vancomycin per pharmacy. Continue Zosyn per pharmacy. Follow-up on cultures and sensitivity. Consider orthopedic surgery or podiatry consult if no improvement.  Active Problems:   Suicidal ideations   Major depressive disorder, recurrent, severe without psychotic features (HCC) Resume oral medications. Continue one-to-one monitoring. Consult psych in the morning.    Essential hypertension The patient declined to be on a low-sodium diet. Resume lisinopril 40 mg by mouth daily. Monitor blood pressure periodically.    Anemia of chronic disease Monitor hematocrit and hemoglobin.      Rheumatoid arthritis (HCC) Continue current medications.    Code Status: Full code. DVT  Prophylaxis: Lovenox. Family Communication:  Disposition Plan: Admit for IV antibiotic therapy and psych consult in the morning  Time spent: Over 70 minutes were spent during the procedures admission.  Bobette Mo, M.D. [facility] Pager [phone].

## 2015-12-06 NOTE — Progress Notes (Addendum)
PROGRESS NOTE    Tara Brown  IOE:[id number]  DOB: 1961/01/29  DOA: 12/05/2015 PCP: [facility] Specialists: Rheumatology: Dr. Stacey Drain Podiatry: Dr. Toni Arthurs course: 55 year old female with a PMH of rheumatoid/psoriatic arthritis, anemia, osteoporosis, depression, lives alone, mostly independent of activities of daily living but at times uses a cane for ambulation, presented to [facility] ED for evaluation of fatigue. She apparently has been noncompliant with her medications and has been under a lot of stress physically from her rheumatoid arthritis and financial stress. She is overwhelmed and contemplating suicide by drug OD but did not attempt. In the ED, noted to have open draining wound of her left big toe with hardware protruding out. X-ray showed loosening of the great toe first MTP joint prosthesis. Admitted for further management. Suicide precautions. Psychiatry and orthopedics consulted.   Assessment & Plan:   Cellulitis complicating chronic left big toe wound and osteomyelitis. - Patient states that she has had the wound for almost 9 months which intermittently drains and has been treated with several courses of antibiotics and her podiatrist plans to perform some surgical intervention. - X-ray shows loosening of the great toe first MTP joint prosthesis, further subluxation of the first metatarsal head prosthetic, component and underlying bone resorption. - Continue empirically started IV vancomycin and Zosyn. - Orthopedics/Dr. Lajoyce Corners consulted - Wound culture pending.  Suicidal ideation/major depressive disorder, recurrent, severe without psychotic features. - Suicide precautions & 1:1 Recruitment consultant. - Psychiatry was consulted and discussed with psychiatry. They recommend acute psychiatric hospitalization once medically stable. - Tinea Zoloft 150 MG daily.  Advanced rheumatoid and psoriatic arthritis - Continue  methotrexate.  Essential hypertension - Controlled. Continue lisinopril.  Microcytic anemia/iron & B12 deficiency anemia - Iron 11, TIBC 193, saturation ratio 6, ferritin 44, folate 28.8 and B12: 150 - Start iron and B12 supplements.    DVT prophylaxis: Lovenox Code Status: Full Family Communication: None at bedside Disposition Plan: DC to inpatient psychiatry when medically stable.   Consultants:  Psychiatry  Orthopedics/Dr. Lajoyce Corners  Procedures:  None  Antimicrobials:  IV Zosyn 3/26 >  IV vancomycin 3/26 >  Subjective: No drainage from left big toe wound. Has chronic pain issues related to her arthritis. Ongoing intermittent suicidal ideations but no definitive plan.  Objective: Filed Vitals:   12/05/15 2301 12/06/15 0358 12/06/15 0945 12/06/15 1347  BP: 135/60 112/57 116/69 103/46  Pulse: 71 73  78  Temp: 98.4 F (36.9 C) 98 F (36.7 C)  99.5 F (37.5 C)  TempSrc: Oral Oral  Oral  Resp: 16 16  24   Height:      Weight:      SpO2: 100% 99%  98%    Intake/Output Summary (Last 24 hours) at 12/06/15 1625 Last data filed at 12/06/15 1500  Gross per 24 hour  Intake   1415 ml  Output      2 ml  Net   1413 ml   Filed Weights   12/05/15 1555  Weight: 50.349 kg (111 lb)    Exam:  General exam: Small built and frail middle-aged female lying comfortably supine in bed. Suicide sitter at bedside. Respiratory system: Clear. No increased work of breathing. Cardiovascular system: S1 & S2 heard, RRR. No JVD, murmurs, gallops, clicks or pedal edema. Gastrointestinal system: Abdomen is nondistended, soft and nontender. Normal bowel sounds heard. Central nervous system: Alert and oriented. No focal neurological deficits. Extremities: Symmetric 5 x 5 power. Severe arthritic changes with  deformities of her fingers and toes. Left great toe wound without any overt drainage, does have metal piece protruding out. Mild erythema surrounding the wound. No fluctuance or  crepitus. Please see picture below.         Data Reviewed: Basic Metabolic Panel:  Recent Labs Lab 12/05/15 1730 12/06/15 0417  NA 139 142  K 3.5 3.7  CL 109 110  CO2 23 25  GLUCOSE 79 96  BUN 17 18  CREATININE 0.77 0.84  CALCIUM 8.8* 8.4*  MG  --  2.1  PHOS  --  4.6   Liver Function Tests:  Recent Labs Lab 12/06/15 0417  AST 13*  ALT 8*  ALKPHOS 70  BILITOT 0.4  PROT 6.7  ALBUMIN 2.9*   No results for input(s): LIPASE, AMYLASE in the last 168 hours. No results for input(s): AMMONIA in the last 168 hours. CBC:  Recent Labs Lab 12/05/15 1730 12/06/15 0417  WBC 7.1 5.5  NEUTROABS 4.3 3.2  HGB 9.6* 8.3*  HCT 31.1* 27.0*  MCV 75.3* 76.3*  PLT 345 265   Cardiac Enzymes: No results for input(s): CKTOTAL, CKMB, CKMBINDEX, TROPONINI in the last 168 hours. BNP (last 3 results) No results for input(s): PROBNP in the last 8760 hours. CBG: No results for input(s): GLUCAP in the last 168 hours.  Recent Results (from the past 240 hour(s))  Wound culture     Status: None (Preliminary result)   Collection Time: 12/05/15  5:30 PM  Result Value Ref Range Status   Specimen Description FOOT LEFT  Final   Special Requests Immunocompromised  Final   Gram Stain   Final    RARE WBC NO SQUAMOUS EPITHELIAL CELLS SEEN FEW GRAM POSITIVE COCCI IN PAIRS Performed at [facility]    Culture   Final    Culture reincubated for better growth Performed at [facility]    Report Status PENDING  Incomplete         Studies: Dg Foot Complete Left  12/05/2015  CLINICAL DATA:  Left great toe redness and swelling. Hardware protruding medially. EXAM: LEFT FOOT - COMPLETE 3+ VIEW COMPARISON:  09/01/2015 FINDINGS: The prosthetic component at the distal first metatarsal has subluxed for error in the dorsal/medial direction. The underlying bone shows significant resorption. Little bone contacts the hardware. There is also lucency surrounding the prosthetic  component at the base of the great toe proximal phalanx. This has mildly increased from the prior study. There is resorption of the heads of the second through fifth metatarsals, most prominently the second. Second metatarsophalangeal joint is dislocated with proximal phalanx projecting dorsal to the distal second metatarsal. There are resorption zone the proximal phalanges of the fourth and fifth toes. There is partial resorption of the middle phalanx of the fourth toe. Bones are diffusely demineralized. Soft tissues are unremarkable. There is no acute fracture. IMPRESSION: 1. Loosening of the great toe first MTP joint prosthesis. There has been further subluxation of the first metatarsal head prosthetic component. There is more underlying bone resorption than there was on the prior study. 2. Other changes of forefoot erosions are stable from the prior exam. No acute fracture. Electronically Signed   By: Amie Portland M.D.   On: 12/05/2015 18:30        Scheduled Meds: . calcium-vitamin D  1 tablet Oral BID WC  . enoxaparin (LOVENOX) injection  40 mg Subcutaneous Q24H  . folic acid  1 mg Oral Daily  . lisinopril  40 mg Oral  Daily  . meloxicam  15 mg Oral Daily  . [START ON 12/09/2015] methotrexate  7.5 mg Oral Once per day on Thu Fri  . pantoprazole  40 mg Oral Daily  . piperacillin-tazobactam (ZOSYN)  IV  3.375 g Intravenous Q8H  . predniSONE  10 mg Oral Q breakfast  . sertraline  150 mg Oral Daily  . sodium chloride flush  3 mL Intravenous Q12H  . vancomycin  500 mg Intravenous Q12H   Continuous Infusions: . 0.9 % NaCl with KCl 20 mEq / L 75 mL/hr at 12/06/15 0220    Principal Problem:   Major depressive disorder, recurrent, severe without psychotic features (HCC) Active Problems:   Anemia of chronic disease   Essential hypertension   Chronic osteomyelitis (HCC)   Cellulitis of toe, left   Left foot infection   Suicidal ideations   Rheumatoid arthritis (HCC)    Time spent: 30  minutes.    Marcellus Scott, MD, FACP, FHM. [facility] Pager [phone]  If 7PM-7AM, please contact night-coverage [email or web address] Password [email or web address] 12/06/2015, 4:25 PM    LOS: 1 day

## 2015-12-06 NOTE — Consult Note (Signed)
ORTHOPAEDIC CONSULTATION  REQUESTING PHYSICIAN: Elease Etienne, MD  Chief Complaint: Osteomyelitis left great toe with exposure of Silastic metal implant  HPI: Tara Brown is a 55 y.o. female who presents with ulceration with exposed metal from a Silastic implant for the great toe MTP joint. She is status post metatarsal head resections and has had progressive ulceration cellulitis and purulent drainage from the great toe MTP joint.  Past Medical History  Diagnosis Date  . Major depressive disorder, recurrent, severe without psychotic features (HCC) 03/11/2015   Past Surgical History  Procedure Laterality Date  . Hip fracture surgery    . Knee surgery    . Foot surgery    . Hand surgery    . Neck surgery     Social History   Social History  . Marital Status: Divorced    Spouse Name: N/A  . Number of Children: N/A  . Years of Education: N/A   Social History Main Topics  . Smoking status: Never Smoker   . Smokeless tobacco: Never Used  . Alcohol Use: Yes     Comment: Rarely  . Drug Use: No  . Sexual Activity: No   Other Topics Concern  . None   Social History Narrative   Family History  Problem Relation Age of Onset  . Stroke Neg Hx    - negative except otherwise stated in the family history section No Known Allergies Prior to Admission medications   Medication Sig Start Date End Date Taking? Authorizing Provider  calcium-vitamin D (OSCAL WITH D) 500-200 MG-UNIT tablet Take 1 tablet by mouth 2 (two) times daily.   Yes Historical Provider, MD  fluocinonide cream (LIDEX) 0.05 % Apply 1 application topically 2 (two) times daily as needed (for psorasis).   Yes Historical Provider, MD  folic acid (FOLVITE) 1 MG tablet Take 1 mg by mouth daily. 10/12/15  Yes Historical Provider, MD  lisinopril (PRINIVIL,ZESTRIL) 40 MG tablet Take 40 mg by mouth daily. 11/26/14  Yes Historical Provider, MD  meloxicam (MOBIC) 15 MG tablet Take 15 mg by mouth daily. 09/18/15  Yes  Historical Provider, MD  methotrexate (RHEUMATREX) 2.5 MG tablet Take 7.5 mg by mouth 2 (two) times a week. Thursday and Friday 10/12/15  Yes Historical Provider, MD  predniSONE (DELTASONE) 10 MG tablet Take 10 mg by mouth daily. 10/12/15  Yes Historical Provider, MD  sertraline (ZOLOFT) 50 MG tablet Take 150 mg by mouth daily. 09/18/15  Yes Historical Provider, MD  traMADol (ULTRAM) 50 MG tablet Take 50 mg by mouth every 6 (six) hours as needed. For pain. 11/12/15  Yes Historical Provider, MD  zolpidem (AMBIEN) 10 MG tablet Take 10 mg by mouth at bedtime as needed. For sleep. 11/10/15  Yes Historical Provider, MD   Dg Foot Complete Left  12/05/2015  CLINICAL DATA:  Left great toe redness and swelling. Hardware protruding medially. EXAM: LEFT FOOT - COMPLETE 3+ VIEW COMPARISON:  09/01/2015 FINDINGS: The prosthetic component at the distal first metatarsal has subluxed for error in the dorsal/medial direction. The underlying bone shows significant resorption. Little bone contacts the hardware. There is also lucency surrounding the prosthetic component at the base of the great toe proximal phalanx. This has mildly increased from the prior study. There is resorption of the heads of the second through fifth metatarsals, most prominently the second. Second metatarsophalangeal joint is dislocated with proximal phalanx projecting dorsal to the distal second metatarsal. There are resorption zone the proximal phalanges of the fourth and  fifth toes. There is partial resorption of the middle phalanx of the fourth toe. Bones are diffusely demineralized. Soft tissues are unremarkable. There is no acute fracture. IMPRESSION: 1. Loosening of the great toe first MTP joint prosthesis. There has been further subluxation of the first metatarsal head prosthetic component. There is more underlying bone resorption than there was on the prior study. 2. Other changes of forefoot erosions are stable from the prior exam. No acute fracture.  Electronically Signed   By: David  Ormond M.D.   On: 12/05/2015 18:30   - pertinent xrays, CT, MRI studies were reviewed and independently interpreted  Positive ROS: All other systems have been reviewed and were otherwise negative with the exception of those mentioned in the HPI and as above.  Physical Exam: General: Alert, no acute distress Cardiovascular: Edema bilateral lower extremities Respiratory: No cyanosis, no use of accessory musculature GI: No organomegaly, abdomen is soft and non-tender Skin: Necrotic ulcer left great toe with exposed metal Silastic implant with purulent drainage. Neurologic: Patient does not have protective sensation Psychiatric: Patient reports suicidal ideation Lymphatic: No axillary or cervical lymphadenopathy  MUSCULOSKELETAL:  Examination she has a good dorsalis pedis pulse. There is exposed metal with purulent drainage from the MTP joint. Radiograph is reviewed which shows a failed metal Silastic implant with lytic changes in the bone with significant destructive changes most likely due to the Silastic failure. Secondarily this has been coming infected due to the metal implant protruding through the skin.  Assessment: Assessment: Osteomyelitis left great toe MTP joint with purulent joint with exposed metal from a failed Silastic metal implant with severe destructive changes of the bone.  Plan: Plan: I feel her best option is to amputate the great toe just proximal the MTP joint. With the massive destructive changes in the bone and the osteomyelitis I do not feel that any salvage options would be beneficial. Patient should still be able to ambulate without any difficulty with regular shoewear. We will try to add on the surgery tomorrow morning at 11 AM. If the surgery schedule runs late we'll then try again for surgery on Wednesday. Risks and benefits of surgery were discussed patient states she understands she is depressed with the news states that she is  dealing with a lot of family social issues and wishes to proceed at this time.  Thank you for the consult and the opportunity to see Tara Brown  Sherley Leser, MD [facility] [phone] 6:04 PM     

## 2015-12-07 ENCOUNTER — Inpatient Hospital Stay (HOSPITAL_COMMUNITY): Payer: Medicare Other | Admitting: Registered Nurse

## 2015-12-07 ENCOUNTER — Encounter (HOSPITAL_COMMUNITY)
Admission: EM | Disposition: A | Discharge status: Home / Self Care | Payer: Self-pay | Source: Home / Self Care | Attending: Internal Medicine

## 2015-12-07 ENCOUNTER — Encounter (HOSPITAL_COMMUNITY): Payer: Self-pay | Admitting: Registered Nurse

## 2015-12-07 DIAGNOSIS — L03032 Cellulitis of left toe: Secondary | ICD-10-CM

## 2015-12-07 HISTORY — PX: AMPUTATION: SHX166

## 2015-12-07 LAB — CBC WITH DIFFERENTIAL/PLATELET
BASOS ABS: 0 10*3/uL (ref 0.0–0.1)
BASOS PCT: 1 %
Eosinophils Absolute: 0.1 10*3/uL (ref 0.0–0.7)
Eosinophils Relative: 2 %
HEMATOCRIT: 27.6 % — AB (ref 36.0–46.0)
Hemoglobin: 8.4 g/dL — ABNORMAL LOW (ref 12.0–15.0)
LYMPHS PCT: 25 %
Lymphs Abs: 1.7 10*3/uL (ref 0.7–4.0)
MCH: 23.3 pg — ABNORMAL LOW (ref 26.0–34.0)
MCHC: 30.4 g/dL (ref 30.0–36.0)
MCV: 76.7 fL — AB (ref 78.0–100.0)
Monocytes Absolute: 0.5 10*3/uL (ref 0.1–1.0)
Monocytes Relative: 8 %
NEUTROS ABS: 4.4 10*3/uL (ref 1.7–7.7)
Neutrophils Relative %: 66 %
PLATELETS: 309 10*3/uL (ref 150–400)
RBC: 3.6 MIL/uL — AB (ref 3.87–5.11)
RDW: 20.2 % — ABNORMAL HIGH (ref 11.5–15.5)
WBC: 6.7 10*3/uL (ref 4.0–10.5)

## 2015-12-07 LAB — SURGICAL PCR SCREEN
MRSA, PCR: NEGATIVE
STAPHYLOCOCCUS AUREUS: NEGATIVE

## 2015-12-07 SURGERY — AMPUTATION DIGIT
Anesthesia: General | Site: Foot | Laterality: Left

## 2015-12-07 MED ORDER — LIDOCAINE HCL (CARDIAC) 20 MG/ML IV SOLN
INTRAVENOUS | Status: DC | PRN
  Administered 2015-12-07: 80 mg via INTRAVENOUS

## 2015-12-07 MED ORDER — ONDANSETRON HCL 4 MG/2ML IJ SOLN
INTRAMUSCULAR | Status: AC
  Filled 2015-12-07: qty 2

## 2015-12-07 MED ORDER — HYDROMORPHONE HCL 1 MG/ML IJ SOLN
INTRAMUSCULAR | Status: AC
  Administered 2015-12-07: 1 mg via INTRAVENOUS
  Filled 2015-12-07: qty 1

## 2015-12-07 MED ORDER — CHLORHEXIDINE GLUCONATE 4 % EX LIQD
60.0000 mL | Freq: Once | CUTANEOUS | Status: DC
  Filled 2015-12-07: qty 60

## 2015-12-07 MED ORDER — PROPOFOL 10 MG/ML IV BOLUS
INTRAVENOUS | Status: AC
  Filled 2015-12-07: qty 20

## 2015-12-07 MED ORDER — LACTATED RINGERS IV SOLN: INTRAVENOUS | Status: DC

## 2015-12-07 MED ORDER — ACETAMINOPHEN 325 MG PO TABS
650.0000 mg | ORAL_TABLET | Freq: Four times a day (QID) | ORAL | Status: DC | PRN
  Administered 2015-12-10 – 2015-12-12 (×5): 650 mg via ORAL
  Filled 2015-12-07 (×5): qty 2

## 2015-12-07 MED ORDER — 0.9 % SODIUM CHLORIDE (POUR BTL) OPTIME
TOPICAL | Status: DC | PRN
  Administered 2015-12-07: 1000 mL

## 2015-12-07 MED ORDER — ACETAMINOPHEN 650 MG RE SUPP: 650.0000 mg | Freq: Four times a day (QID) | RECTAL | Status: DC | PRN

## 2015-12-07 MED ORDER — ONDANSETRON HCL 4 MG/2ML IJ SOLN: 4.0000 mg | Freq: Four times a day (QID) | INTRAMUSCULAR | Status: DC | PRN

## 2015-12-07 MED ORDER — HYDROMORPHONE HCL 2 MG/ML IJ SOLN
INTRAMUSCULAR | Status: AC
  Filled 2015-12-07: qty 1

## 2015-12-07 MED ORDER — CEFAZOLIN SODIUM-DEXTROSE 2-4 GM/100ML-% IV SOLN
2.0000 g | INTRAVENOUS | Status: AC
  Filled 2015-12-07: qty 100

## 2015-12-07 MED ORDER — PHENYLEPHRINE 40 MCG/ML (10ML) SYRINGE FOR IV PUSH (FOR BLOOD PRESSURE SUPPORT)
PREFILLED_SYRINGE | INTRAVENOUS | Status: AC
Start: 2015-12-07 — End: 2015-12-07
  Filled 2015-12-07: qty 10

## 2015-12-07 MED ORDER — METHOCARBAMOL 500 MG PO TABS
500.0000 mg | ORAL_TABLET | Freq: Four times a day (QID) | ORAL | Status: DC | PRN
  Administered 2015-12-08 – 2015-12-10 (×6): 500 mg via ORAL
  Filled 2015-12-07 (×6): qty 1

## 2015-12-07 MED ORDER — FENTANYL CITRATE (PF) 100 MCG/2ML IJ SOLN
INTRAMUSCULAR | Status: AC
  Filled 2015-12-07: qty 2

## 2015-12-07 MED ORDER — METOCLOPRAMIDE HCL 5 MG/ML IJ SOLN: 5.0000 mg | Freq: Three times a day (TID) | INTRAMUSCULAR | Status: DC | PRN

## 2015-12-07 MED ORDER — SODIUM CHLORIDE 0.45 % IV SOLN
INTRAVENOUS | Status: DC
Start: 2015-12-07 — End: 2015-12-10
  Administered 2015-12-07: 75 mL/h via INTRAVENOUS
  Administered 2015-12-08: 06:00:00 via INTRAVENOUS

## 2015-12-07 MED ORDER — SODIUM CHLORIDE 0.9 % IV SOLN: INTRAVENOUS | Status: DC

## 2015-12-07 MED ORDER — LIP MEDEX EX OINT
TOPICAL_OINTMENT | CUTANEOUS | Status: AC
  Administered 2015-12-07: 1
  Filled 2015-12-07: qty 7

## 2015-12-07 MED ORDER — FENTANYL CITRATE (PF) 100 MCG/2ML IJ SOLN
25.0000 ug | INTRAMUSCULAR | Status: DC | PRN
  Administered 2015-12-07 (×3): 50 ug via INTRAVENOUS

## 2015-12-07 MED ORDER — PHENYLEPHRINE HCL 10 MG/ML IJ SOLN
INTRAMUSCULAR | Status: DC | PRN
  Administered 2015-12-07: 80 ug via INTRAVENOUS

## 2015-12-07 MED ORDER — LIDOCAINE HCL (CARDIAC) 20 MG/ML IV SOLN
INTRAVENOUS | Status: AC
  Filled 2015-12-07: qty 5

## 2015-12-07 MED ORDER — ONDANSETRON HCL 4 MG PO TABS: 4.0000 mg | ORAL_TABLET | Freq: Four times a day (QID) | ORAL | Status: DC | PRN

## 2015-12-07 MED ORDER — MIDAZOLAM HCL 2 MG/2ML IJ SOLN
INTRAMUSCULAR | Status: AC
  Filled 2015-12-07: qty 2

## 2015-12-07 MED ORDER — ONDANSETRON HCL 4 MG/2ML IJ SOLN
INTRAMUSCULAR | Status: DC | PRN
  Administered 2015-12-07: 4 mg via INTRAVENOUS

## 2015-12-07 MED ORDER — METHOCARBAMOL 1000 MG/10ML IJ SOLN
500.0000 mg | Freq: Four times a day (QID) | INTRAVENOUS | Status: DC | PRN
  Filled 2015-12-07: qty 5

## 2015-12-07 MED ORDER — MIDAZOLAM HCL 5 MG/5ML IJ SOLN
INTRAMUSCULAR | Status: DC | PRN
  Administered 2015-12-07: 2 mg via INTRAVENOUS

## 2015-12-07 MED ORDER — METOCLOPRAMIDE HCL 10 MG PO TABS: 5.0000 mg | ORAL_TABLET | Freq: Three times a day (TID) | ORAL | Status: DC | PRN

## 2015-12-07 MED ORDER — LACTATED RINGERS IV SOLN
INTRAVENOUS | Status: DC | PRN
  Administered 2015-12-07: 11:00:00 via INTRAVENOUS

## 2015-12-07 MED ORDER — PROPOFOL 10 MG/ML IV BOLUS
INTRAVENOUS | Status: DC | PRN
  Administered 2015-12-07: 200 mg via INTRAVENOUS

## 2015-12-07 MED ORDER — FENTANYL CITRATE (PF) 100 MCG/2ML IJ SOLN
INTRAMUSCULAR | Status: DC | PRN
  Administered 2015-12-07 (×3): 50 ug via INTRAVENOUS
  Administered 2015-12-07 (×2): 25 ug via INTRAVENOUS

## 2015-12-07 MED ORDER — EPHEDRINE SULFATE 50 MG/ML IJ SOLN
INTRAMUSCULAR | Status: DC | PRN
  Administered 2015-12-07 (×2): 5 mg via INTRAVENOUS

## 2015-12-07 MED ORDER — HYDROMORPHONE HCL 1 MG/ML IJ SOLN
0.2500 mg | INTRAMUSCULAR | Status: DC | PRN
  Administered 2015-12-07: 0.5 mg via INTRAVENOUS

## 2015-12-07 MED ORDER — HYDROMORPHONE HCL 1 MG/ML IJ SOLN
1.0000 mg | INTRAMUSCULAR | Status: DC | PRN
  Administered 2015-12-07 (×2): 1 mg via INTRAVENOUS
  Filled 2015-12-07 (×3): qty 1

## 2015-12-07 MED ORDER — POVIDONE-IODINE 10 % EX SWAB: 2.0000 "application " | Freq: Once | CUTANEOUS | Status: DC

## 2015-12-07 SURGICAL SUPPLY — 28 items
BAG ZIPLOCK 12X15 (MISCELLANEOUS) ×3 IMPLANT
BLADE MIC 41X13 (BLADE) ×2 IMPLANT
BLADE MIC 41X13MM (BLADE) ×1
BLADE SURG SZ10 CARB STEEL (BLADE) IMPLANT
BNDG COHESIVE 4X5 TAN STRL (GAUZE/BANDAGES/DRESSINGS) ×3 IMPLANT
BNDG GAUZE ELAST 4 BULKY (GAUZE/BANDAGES/DRESSINGS) ×3 IMPLANT
DRAPE U-SHAPE 47X51 STRL (DRAPES) IMPLANT
DRSG EMULSION OIL 3X16 NADH (GAUZE/BANDAGES/DRESSINGS) ×3 IMPLANT
DRSG PAD ABDOMINAL 8X10 ST (GAUZE/BANDAGES/DRESSINGS) ×3 IMPLANT
DURAPREP 26ML APPLICATOR (WOUND CARE) ×3 IMPLANT
ELECT REM PT RETURN 9FT ADLT (ELECTROSURGICAL) ×3
ELECTRODE REM PT RTRN 9FT ADLT (ELECTROSURGICAL) ×1 IMPLANT
GAUZE SPONGE 4X4 12PLY STRL (GAUZE/BANDAGES/DRESSINGS) ×3 IMPLANT
GLOVE BIOGEL PI IND STRL 8.5 (GLOVE) ×1 IMPLANT
GLOVE BIOGEL PI INDICATOR 8.5 (GLOVE) ×2
GLOVE SURG ORTHO 9.0 STRL STRW (GLOVE) ×3 IMPLANT
KIT BASIN OR (CUSTOM PROCEDURE TRAY) ×3 IMPLANT
MANIFOLD NEPTUNE II (INSTRUMENTS) ×3 IMPLANT
NS IRRIG 1000ML POUR BTL (IV SOLUTION) ×3 IMPLANT
PACK ORTHO EXTREMITY (CUSTOM PROCEDURE TRAY) ×3 IMPLANT
PAD ABD 8X10 STRL (GAUZE/BANDAGES/DRESSINGS) ×3 IMPLANT
POSITIONER SURGICAL ARM (MISCELLANEOUS) ×3 IMPLANT
SUCTION FRAZIER HANDLE 12FR (TUBING) ×2
SUCTION TUBE FRAZIER 12FR DISP (TUBING) ×1 IMPLANT
SUT ETHILON 2 0 PSLX (SUTURE) ×3 IMPLANT
SWAB COLLECTION DEVICE MRSA (MISCELLANEOUS) IMPLANT
SWAB CULTURE ESWAB REG 1ML (MISCELLANEOUS) IMPLANT
WATER STERILE IRR 1500ML POUR (IV SOLUTION) IMPLANT

## 2015-12-07 NOTE — Transfer of Care (Signed)
Immediate Anesthesia Transfer of Care Note  Patient: Tara Brown  Procedure(s) Performed: Procedure(s): AMPUTATION LEFT GREAT TOE (Left)  Patient Location: PACU  Anesthesia Type:General  Level of Consciousness: awake, alert , oriented and patient cooperative  Airway & Oxygen Therapy: Patient Spontanous Breathing and Patient connected to face mask oxygen  Post-op Assessment: Report given to RN, Post -op Vital signs reviewed and stable and Patient moving all extremities  Post vital signs: Reviewed and stable  Last Vitals:  Filed Vitals:   12/06/15 2140 12/07/15 0500  BP: 108/54 119/60  Pulse: 65 70  Temp: 37.3 C 37.6 C  Resp: 18 20    Complications: No apparent anesthesia complications

## 2015-12-07 NOTE — Op Note (Signed)
12/05/2015 - 12/07/2015  11:59 AM  PATIENT:  Tara Brown    PRE-OPERATIVE DIAGNOSIS:  osteomyelitis left great toe  POST-OPERATIVE DIAGNOSIS:  Same  PROCEDURE:  AMPUTATION LEFT GREAT TOE and first metatarsal Local tissue rearrangement for wound closure 3 x 7 cm  SURGEON:  Lutie Pickler V, MD  PHYSICIAN ASSISTANT:None ANESTHESIA:   General  PREOPERATIVE INDICATIONS:  Tara Brown is a  55 y.o. female with a diagnosis of osteomyelitis left great toe who failed conservative measures and elected for surgical management.    The risks benefits and alternatives were discussed with the patient preoperatively including but not limited to the risks of infection, bleeding, nerve injury, cardiopulmonary complications, the need for revision surgery, among others, and the patient was willing to proceed.  OPERATIVE IMPLANTS: None  OPERATIVE FINDINGS: Silastic debris and deep abscess secondary to the Silastic and metal implant  OPERATIVE PROCEDURE: Patient was brought to the operating room and underwent a general anesthetic. After adequate levels anesthesia obtained patient's left lower extremity was prepped using DuraPrep draped into a sterile field. A timeout was called. A racquet incision was made around the toe and the ulcerative tissue. The first metatarsal was resected plantarly beveled and the sesamoids great toe and metatarsal head were resected in 1 block of tissue proximal to the destruction of the Silastic implant. Silastic material was removed from the wound. The wound was irrigated with normal saline. There is no deep abscess. Electro cautery was used for hemostasis. Local tissue rearrangement was used for wound closure of a wound 3 x 7 cm using 2-0 nylon.. The wound was covered with a sterile compressive dressing. Patient was extubated taken the PACU in stable condition  Patient will require 24 hours of postoperative IV antibiotics and then may be discharged to home without antibiotics.  Discharge once patient is safe with touchdown weightbearing on the left heel.

## 2015-12-07 NOTE — Progress Notes (Signed)
Nutrition Brief Note  Patient identified with Low BMI. Pt with recent weight gain. Eating 75% prior to NPO status.  Wt Readings from Last 15 Encounters:  12/05/15 111 lb (50.349 kg)  09/11/15 106 lb (48.081 kg)  09/07/15 102 lb (46.267 kg)  09/02/15 102 lb (46.267 kg)  08/31/15 101 lb (45.813 kg)  08/09/15 104 lb (47.174 kg)  08/07/15 101 lb 4.8 oz (45.949 kg)  03/26/15 119 lb (53.978 kg)  03/26/15 125 lb (56.7 kg)  03/11/15 125 lb (56.7 kg)  03/08/15 129 lb 6.6 oz (58.7 kg)  10/07/13 170 lb (77.111 kg)    Body mass index is 17.92 kg/(m^2). Patient meets criteria for underweight based on current BMI.   Current diet order is NPO for surgery, patient was consuming approximately 50-75% of regular diet meals yesterday. Labs and medications reviewed.   No nutrition interventions warranted at this time. If nutrition issues arise, please consult RD.   Tilda Franco, MS, RD, LDN Pager: [phone] After Hours Pager: [phone]

## 2015-12-07 NOTE — Anesthesia Postprocedure Evaluation (Signed)
Anesthesia Post Note  Patient: Architectural technologist  Procedure(s) Performed: Procedure(s) (LRB): AMPUTATION LEFT GREAT TOE (Left)  Patient location during evaluation: PACU Anesthesia Type: General Level of consciousness: awake and alert Pain management: pain level controlled Vital Signs Assessment: post-procedure vital signs reviewed and stable Respiratory status: spontaneous breathing, nonlabored ventilation, respiratory function stable and patient connected to nasal cannula oxygen Cardiovascular status: blood pressure returned to baseline and stable Postop Assessment: no signs of nausea or vomiting Anesthetic complications: no    Last Vitals:  Filed Vitals:   12/07/15 1245 12/07/15 1259  BP: 105/61 113/57  Pulse: 72 75  Temp: 36.8 C 37.1 C  Resp: 13 14    Last Pain:  Filed Vitals:   12/07/15 1301  PainSc: 3                  Tiffane Sheldon L

## 2015-12-07 NOTE — Interval H&P Note (Signed)
History and Physical Interval Note:  12/07/2015 6:16 AM  Tara Brown  has presented today for surgery, with the diagnosis of osteomyelitis left great toe  The various methods of treatment have been discussed with the patient and family. After consideration of risks, benefits and other options for treatment, the patient has consented to  Procedure(s): AMPUTATION LEFT GREAT TOE (Left) as a surgical intervention .  The patient's history has been reviewed, patient examined, no change in status, stable for surgery.  I have reviewed the patient's chart and labs.  Questions were answered to the patient's satisfaction.     Tara Brown V

## 2015-12-07 NOTE — Anesthesia Preprocedure Evaluation (Addendum)
Anesthesia Evaluation  Patient identified by MRN, date of birth, ID band Patient awake    Reviewed: Allergy & Precautions, H&P , NPO status , Patient's Chart, lab work & pertinent test results  Airway Mallampati: II  TM Distance: >3 FB Neck ROM: full    Dental no notable dental hx. (+) Dental Advisory Given, Teeth Intact   Pulmonary neg pulmonary ROS,    Pulmonary exam normal breath sounds clear to auscultation       Cardiovascular Exercise Tolerance: Good hypertension, Pt. on medications Normal cardiovascular exam Rhythm:regular Rate:Normal     Neuro/Psych Depression negative neurological ROS  negative psych ROS   GI/Hepatic negative GI ROS, Neg liver ROS,   Endo/Other  negative endocrine ROS  Renal/GU negative Renal ROS  negative genitourinary   Musculoskeletal  (+) Arthritis , Rheumatoid disorders,    Abdominal   Peds  Hematology negative hematology ROS (+) anemia , hgb 8.4   Anesthesia Other Findings   Reproductive/Obstetrics negative OB ROS                             Anesthesia Physical Anesthesia Plan  ASA: III  Anesthesia Plan: General   Post-op Pain Management:    Induction: Intravenous  Airway Management Planned: LMA  Additional Equipment:   Intra-op Plan:   Post-operative Plan:   Informed Consent: I have reviewed the patients History and Physical, chart, labs and discussed the procedure including the risks, benefits and alternatives for the proposed anesthesia with the patient or authorized representative who has indicated his/her understanding and acceptance.   Dental Advisory Given  Plan Discussed with: CRNA and Surgeon  Anesthesia Plan Comments:         Anesthesia Quick Evaluation

## 2015-12-07 NOTE — Anesthesia Procedure Notes (Signed)
Procedure Name: LMA Insertion Date/Time: 12/07/2015 11:32 AM Performed by: Jarvis Newcomer A Pre-anesthesia Checklist: Patient identified, Emergency Drugs available, Suction available, Patient being monitored and Timeout performed Patient Re-evaluated:Patient Re-evaluated prior to inductionOxygen Delivery Method: Circle system utilized Preoxygenation: Pre-oxygenation with 100% oxygen Intubation Type: IV induction LMA: LMA with gastric port inserted LMA Size: 3.0 Number of attempts: 1 Placement Confirmation: positive ETCO2 and breath sounds checked- equal and bilateral Tube secured with: Tape Dental Injury: Teeth and Oropharynx as per pre-operative assessment

## 2015-12-07 NOTE — H&P (View-Only) (Signed)
ORTHOPAEDIC CONSULTATION  REQUESTING PHYSICIAN: Elease Etienne, MD  Chief Complaint: Osteomyelitis left great toe with exposure of Silastic metal implant  HPI: Tara Brown is a 55 y.o. female who presents with ulceration with exposed metal from a Silastic implant for the great toe MTP joint. She is status post metatarsal head resections and has had progressive ulceration cellulitis and purulent drainage from the great toe MTP joint.  Past Medical History  Diagnosis Date  . Major depressive disorder, recurrent, severe without psychotic features (HCC) 03/11/2015   Past Surgical History  Procedure Laterality Date  . Hip fracture surgery    . Knee surgery    . Foot surgery    . Hand surgery    . Neck surgery     Social History   Social History  . Marital Status: Divorced    Spouse Name: N/A  . Number of Children: N/A  . Years of Education: N/A   Social History Main Topics  . Smoking status: Never Smoker   . Smokeless tobacco: Never Used  . Alcohol Use: Yes     Comment: Rarely  . Drug Use: No  . Sexual Activity: No   Other Topics Concern  . None   Social History Narrative   Family History  Problem Relation Age of Onset  . Stroke Neg Hx    - negative except otherwise stated in the family history section No Known Allergies Prior to Admission medications   Medication Sig Start Date End Date Taking? Authorizing Provider  calcium-vitamin D (OSCAL WITH D) 500-200 MG-UNIT tablet Take 1 tablet by mouth 2 (two) times daily.   Yes Historical Provider, MD  fluocinonide cream (LIDEX) 0.05 % Apply 1 application topically 2 (two) times daily as needed (for psorasis).   Yes Historical Provider, MD  folic acid (FOLVITE) 1 MG tablet Take 1 mg by mouth daily. 10/12/15  Yes Historical Provider, MD  lisinopril (PRINIVIL,ZESTRIL) 40 MG tablet Take 40 mg by mouth daily. 11/26/14  Yes Historical Provider, MD  meloxicam (MOBIC) 15 MG tablet Take 15 mg by mouth daily. 09/18/15  Yes  Historical Provider, MD  methotrexate (RHEUMATREX) 2.5 MG tablet Take 7.5 mg by mouth 2 (two) times a week. Thursday and Friday 10/12/15  Yes Historical Provider, MD  predniSONE (DELTASONE) 10 MG tablet Take 10 mg by mouth daily. 10/12/15  Yes Historical Provider, MD  sertraline (ZOLOFT) 50 MG tablet Take 150 mg by mouth daily. 09/18/15  Yes Historical Provider, MD  traMADol (ULTRAM) 50 MG tablet Take 50 mg by mouth every 6 (six) hours as needed. For pain. 11/12/15  Yes Historical Provider, MD  zolpidem (AMBIEN) 10 MG tablet Take 10 mg by mouth at bedtime as needed. For sleep. 11/10/15  Yes Historical Provider, MD   Dg Foot Complete Left  12/05/2015  CLINICAL DATA:  Left great toe redness and swelling. Hardware protruding medially. EXAM: LEFT FOOT - COMPLETE 3+ VIEW COMPARISON:  09/01/2015 FINDINGS: The prosthetic component at the distal first metatarsal has subluxed for error in the dorsal/medial direction. The underlying bone shows significant resorption. Little bone contacts the hardware. There is also lucency surrounding the prosthetic component at the base of the great toe proximal phalanx. This has mildly increased from the prior study. There is resorption of the heads of the second through fifth metatarsals, most prominently the second. Second metatarsophalangeal joint is dislocated with proximal phalanx projecting dorsal to the distal second metatarsal. There are resorption zone the proximal phalanges of the fourth and  fifth toes. There is partial resorption of the middle phalanx of the fourth toe. Bones are diffusely demineralized. Soft tissues are unremarkable. There is no acute fracture. IMPRESSION: 1. Loosening of the great toe first MTP joint prosthesis. There has been further subluxation of the first metatarsal head prosthetic component. There is more underlying bone resorption than there was on the prior study. 2. Other changes of forefoot erosions are stable from the prior exam. No acute fracture.  Electronically Signed   By: Amie Portland M.D.   On: 12/05/2015 18:30   - pertinent xrays, CT, MRI studies were reviewed and independently interpreted  Positive ROS: All other systems have been reviewed and were otherwise negative with the exception of those mentioned in the HPI and as above.  Physical Exam: General: Alert, no acute distress Cardiovascular: Edema bilateral lower extremities Respiratory: No cyanosis, no use of accessory musculature GI: No organomegaly, abdomen is soft and non-tender Skin: Necrotic ulcer left great toe with exposed metal Silastic implant with purulent drainage. Neurologic: Patient does not have protective sensation Psychiatric: Patient reports suicidal ideation Lymphatic: No axillary or cervical lymphadenopathy  MUSCULOSKELETAL:  Examination she has a good dorsalis pedis pulse. There is exposed metal with purulent drainage from the MTP joint. Radiograph is reviewed which shows a failed metal Silastic implant with lytic changes in the bone with significant destructive changes most likely due to the Silastic failure. Secondarily this has been coming infected due to the metal implant protruding through the skin.  Assessment: Assessment: Osteomyelitis left great toe MTP joint with purulent joint with exposed metal from a failed Silastic metal implant with severe destructive changes of the bone.  Plan: Plan: I feel her best option is to amputate the great toe just proximal the MTP joint. With the massive destructive changes in the bone and the osteomyelitis I do not feel that any salvage options would be beneficial. Patient should still be able to ambulate without any difficulty with regular shoewear. We will try to add on the surgery tomorrow morning at 11 AM. If the surgery schedule runs late we'll then try again for surgery on Wednesday. Risks and benefits of surgery were discussed patient states she understands she is depressed with the news states that she is  dealing with a lot of family social issues and wishes to proceed at this time.  Thank you for the consult and the opportunity to see Tara Brown  Tara Baker, MD [facility] [phone] 6:04 PM

## 2015-12-07 NOTE — Progress Notes (Signed)
PT Cancellation Note  Patient Details Name: Tara Brown MRN: [id number] DOB: [birth year]   Cancelled Treatment:    Reason Eval/Treat Not Completed: Patient at procedure or test/unavailable. Pt scheduled for surgery on today. Will hold PT/sign off at this time. Will await further recommendations from ortho MD post-op. Thanks.    Rebeca Alert, MPT Pager: [phone]

## 2015-12-07 NOTE — Progress Notes (Signed)
PROGRESS NOTE    Tara Brown  NWG:[id number]  DOB: Nov 06, 1960  DOA: 12/05/2015 PCP: Vonna Kotyk Outpatient Specialists: Rheumatology: Dr. Stacey Drain Podiatry: Dr. Toni Arthurs course: 55 year old female with a PMH of rheumatoid/psoriatic arthritis, anemia, osteoporosis, depression, lives alone, mostly independent of activities of daily living but at times uses a cane for ambulation, presented to [facility] [place] [facility] ED for evaluation of fatigue. She apparently has been noncompliant with her medications and has been under a lot of stress physically from her rheumatoid arthritis and financial stress. She is overwhelmed and contemplating suicide by drug OD but did not attempt. In the ED, noted to have open draining wound of her left big toe with hardware protruding out. X-ray showed loosening of the great toe first MTP joint prosthesis. Admitted for further management. Suicide precautions. Psychiatry and orthopedics consulted. S/P amputation of left great toe and first metatarsal on 12/07/15. DC to inpatient psychiatry when medically stable   Assessment & Plan:   Cellulitis complicating chronic left big toe wound and osteomyelitis. - Patient states that she has had the wound for almost 9 months which intermittently drains and has been treated with several courses of antibiotics and her podiatrist plans to perform some surgical intervention. - X-ray shows loosening of the great toe first MTP joint prosthesis, further subluxation of the first metatarsal head prosthetic, component and underlying bone resorption. - Continue empirically started IV vancomycin and Zosyn. - Orthopedics/Dr. Lajoyce Corners consulted and patient underwent amputation of left great toe and first metatarsal on 3/28. Can possibly discontinue all antibiotics 24 hours postop. - Wound culture pending. - We will need to see how patient does with therapies prior to discharging to inpatient  psychiatry.  Suicidal ideation/major depressive disorder, recurrent, severe without psychotic features. - Suicide precautions & 1:1 Recruitment consultant. - Psychiatry was consulted and discussed with psychiatry 3/27. They recommend acute psychiatric hospitalization once medically stable. - Continue Zoloft 150 MG daily. - No suicidal ideations reported today.  Advanced rheumatoid and psoriatic arthritis - Continue methotrexate & prednisone. Patient states that she is also on Cimzia 400 mg SQ every 2 weeks which she has not taken since end of 2016. Outpatient follow-up with Dr. Kellie Simmering.  Essential hypertension - Controlled. Continue lisinopril.  Microcytic anemia/iron & B12 deficiency anemia - Iron 11, TIBC 193, saturation ratio 6, ferritin 44, folate 28.8 and B12: 150 - Started iron and B12 supplements.    DVT prophylaxis: Lovenox Code Status: Full Family Communication: None at bedside Disposition Plan: DC to inpatient psychiatry when medically stable and maybe to ALF from [facility]. Discussed with clinical Child psychotherapist.   Consultants:  Psychiatry  Orthopedics/Dr. Lajoyce Corners  Procedures:  Amputation of left great toe and first metatarsal on 12/07/15  Antimicrobials:  IV Zosyn 3/26 >  IV vancomycin 3/26 >  Subjective: Patient was seen this morning prior to surgery. No suicidal ideations reported. Left great toe looking better with decreased redness and less pain.  Objective: Filed Vitals:   12/06/15 0945 12/06/15 1347 12/06/15 2140 12/07/15 0500  BP: 116/69 103/46 108/54 119/60  Pulse:  78 65 70  Temp:  99.5 F (37.5 C) 99.1 F (37.3 C) 99.7 F (37.6 C)  TempSrc:  Oral Oral Oral  Resp:  24 18 20   Height:      Weight:      SpO2:  98% 98% 99%    Intake/Output Summary (Last 24 hours) at 12/07/15 0741 Last data filed at 12/07/15 0240  Gross per 24  hour  Intake   1260 ml  Output      0 ml  Net   1260 ml   Filed Weights   12/05/15 1555  Weight: 50.349 kg (111 lb)     Exam:  General exam: Small built and frail middle-aged female lying comfortably supine in bed. Suicide sitter at bedside. Respiratory system: Clear. No increased work of breathing. Cardiovascular system: S1 & S2 heard, RRR. No JVD, murmurs, gallops, clicks or pedal edema. Gastrointestinal system: Abdomen is nondistended, soft and nontender. Normal bowel sounds heard. Central nervous system: Alert and oriented. No focal neurological deficits. Extremities: Symmetric 5 x 5 power. Severe arthritic changes with deformities of her fingers and toes. Left great toe wound without any overt drainage, does have metal piece protruding out. Mild erythema surrounding the wound which had significantly decreased. No fluctuance or crepitus. Please see picture below from 3/27.         Data Reviewed: Basic Metabolic Panel:  Recent Labs Lab 12/05/15 1730 12/06/15 0417  NA 139 142  K 3.5 3.7  CL 109 110  CO2 23 25  GLUCOSE 79 96  BUN 17 18  CREATININE 0.77 0.84  CALCIUM 8.8* 8.4*  MG  --  2.1  PHOS  --  4.6   Liver Function Tests:  Recent Labs Lab 12/06/15 0417  AST 13*  ALT 8*  ALKPHOS 70  BILITOT 0.4  PROT 6.7  ALBUMIN 2.9*   No results for input(s): LIPASE, AMYLASE in the last 168 hours. No results for input(s): AMMONIA in the last 168 hours. CBC:  Recent Labs Lab 12/05/15 1730 12/06/15 0417 12/07/15 0418  WBC 7.1 5.5 6.7  NEUTROABS 4.3 3.2 4.4  HGB 9.6* 8.3* 8.4*  HCT 31.1* 27.0* 27.6*  MCV 75.3* 76.3* 76.7*  PLT 345 265 309   Cardiac Enzymes: No results for input(s): CKTOTAL, CKMB, CKMBINDEX, TROPONINI in the last 168 hours. BNP (last 3 results) No results for input(s): PROBNP in the last 8760 hours. CBG: No results for input(s): GLUCAP in the last 168 hours.  Recent Results (from the past 240 hour(s))  Wound culture     Status: None (Preliminary result)   Collection Time: 12/05/15  5:30 PM  Result Value Ref Range Status   Specimen Description FOOT LEFT   Final   Special Requests Immunocompromised  Final   Gram Stain   Final    RARE WBC NO SQUAMOUS EPITHELIAL CELLS SEEN FEW GRAM POSITIVE COCCI IN PAIRS Performed at [facility]    Culture   Final    Culture reincubated for better growth Performed at [facility]    Report Status PENDING  Incomplete         Studies: Dg Foot Complete Left  12/05/2015  CLINICAL DATA:  Left great toe redness and swelling. Hardware protruding medially. EXAM: LEFT FOOT - COMPLETE 3+ VIEW COMPARISON:  09/01/2015 FINDINGS: The prosthetic component at the distal first metatarsal has subluxed for error in the dorsal/medial direction. The underlying bone shows significant resorption. Little bone contacts the hardware. There is also lucency surrounding the prosthetic component at the base of the great toe proximal phalanx. This has mildly increased from the prior study. There is resorption of the heads of the second through fifth metatarsals, most prominently the second. Second metatarsophalangeal joint is dislocated with proximal phalanx projecting dorsal to the distal second metatarsal. There are resorption zone the proximal phalanges of the fourth and fifth toes. There is partial resorption of the middle  phalanx of the fourth toe. Bones are diffusely demineralized. Soft tissues are unremarkable. There is no acute fracture. IMPRESSION: 1. Loosening of the great toe first MTP joint prosthesis. There has been further subluxation of the first metatarsal head prosthetic component. There is more underlying bone resorption than there was on the prior study. 2. Other changes of forefoot erosions are stable from the prior exam. No acute fracture. Electronically Signed   By: Amie Portland M.D.   On: 12/05/2015 18:30        Scheduled Meds: . calcium-vitamin D  1 tablet Oral BID WC  . docusate sodium  100 mg Oral BID  . enoxaparin (LOVENOX) injection  40 mg Subcutaneous Q24H  . Ferrous Fumarate  1 tablet  Oral Daily  . folic acid  1 mg Oral Daily  . lisinopril  40 mg Oral Daily  . meloxicam  15 mg Oral Daily  . [START ON 12/09/2015] methotrexate  7.5 mg Oral Once per day on Thu Fri  . pantoprazole  40 mg Oral Daily  . piperacillin-tazobactam (ZOSYN)  IV  3.375 g Intravenous Q8H  . predniSONE  10 mg Oral Q breakfast  . sertraline  150 mg Oral Daily  . sodium chloride flush  3 mL Intravenous Q12H  . vancomycin  500 mg Intravenous Q12H  . vitamin B-12  1,000 mcg Oral Daily   Continuous Infusions:    Principal Problem:   Major depressive disorder, recurrent, severe without psychotic features (HCC) Active Problems:   Anemia of chronic disease   Essential hypertension   Chronic osteomyelitis (HCC)   Cellulitis of toe, left   Left foot infection   Suicidal ideations   Rheumatoid arthritis (HCC)    Time spent: 20 minutes.    Marcellus Scott, MD, FACP, FHM. [facility] Pager [phone]  If 7PM-7AM, please contact night-coverage [email or web address] Password [facility] 12/07/2015, 7:41 AM    LOS: 2 days

## 2015-12-08 LAB — WOUND CULTURE

## 2015-12-08 LAB — CBC WITH DIFFERENTIAL/PLATELET
BASOS PCT: 0 %
Basophils Absolute: 0 10*3/uL (ref 0.0–0.1)
Eosinophils Absolute: 0.1 10*3/uL (ref 0.0–0.7)
Eosinophils Relative: 1 %
HEMATOCRIT: 24.9 % — AB (ref 36.0–46.0)
Hemoglobin: 7.7 g/dL — ABNORMAL LOW (ref 12.0–15.0)
LYMPHS ABS: 2 10*3/uL (ref 0.7–4.0)
Lymphocytes Relative: 26 %
MCH: 23.6 pg — AB (ref 26.0–34.0)
MCHC: 30.9 g/dL (ref 30.0–36.0)
MCV: 76.4 fL — ABNORMAL LOW (ref 78.0–100.0)
MONO ABS: 0.5 10*3/uL (ref 0.1–1.0)
MONOS PCT: 7 %
NEUTROS ABS: 5.1 10*3/uL (ref 1.7–7.7)
Neutrophils Relative %: 66 %
Platelets: 307 10*3/uL (ref 150–400)
RBC: 3.26 MIL/uL — ABNORMAL LOW (ref 3.87–5.11)
RDW: 20.1 % — AB (ref 11.5–15.5)
WBC: 7.6 10*3/uL (ref 4.0–10.5)

## 2015-12-08 MED ORDER — OXYCODONE HCL 5 MG PO TABS
5.0000 mg | ORAL_TABLET | ORAL | Status: DC | PRN
  Administered 2015-12-08 – 2015-12-11 (×13): 10 mg via ORAL
  Filled 2015-12-08 (×13): qty 2

## 2015-12-08 MED ORDER — ZOLPIDEM TARTRATE 5 MG PO TABS
5.0000 mg | ORAL_TABLET | Freq: Once | ORAL | Status: AC
  Administered 2015-12-08: 5 mg via ORAL
  Filled 2015-12-08: qty 1

## 2015-12-08 NOTE — Evaluation (Signed)
Physical Therapy Evaluation Patient Details Name: Tara Brown MRN: [id number] DOB: 1961/05/21 Today's Date: 12/08/2015   History of Present Illness  55 yo female admitted through ED with deperession.  Pt with hx of RA with significant limitations noted in UEs.  Pt s/p L great toe amputation 2* osteomyelitis 12/07/15  Clinical Impression  Pt admitted as above and presenting with functional mobility limitations 2* significant RA changes Bil UEs, TDWB on L LE and post op pain.  Pt plans dc to Behavioral Health to focus on depression prior to return home.    Follow Up Recommendations Other (comment) (Behavioral health)    Equipment Recommendations       Recommendations for Other Services OT consult     Precautions / Restrictions Precautions Precautions: Fall Precaution Comments: Limted use of UEs 2* RA changes Required Braces or Orthoses: Other Brace/Splint Other Brace/Splint: post op shoe on L Restrictions Weight Bearing Restrictions: Yes LLE Weight Bearing: Touchdown weight bearing      Mobility  Bed Mobility Overal bed mobility: Modified Independent             General bed mobility comments: Pt in/out bed unassisted  Transfers Overall transfer level: Needs assistance Equipment used: None Transfers: Sit to/from Stand Sit to Stand: Supervision         General transfer comment: cues to limit WB on L LE  Ambulation/Gait Ambulation/Gait assistance: Min guard Ambulation Distance (Feet): 60 Feet Assistive device: Rolling walker (2 wheeled);Bilateral platform walker Gait Pattern/deviations: Step-to pattern;Shuffle;Trunk flexed Gait velocity: Cues to slow pace to maintain TDWB   General Gait Details: Cues for sequence, positon from RW and posture as well as frequent cues for TDWB and increased WB through elbows.    Stairs            Wheelchair Mobility    Modified Rankin (Stroke Patients Only)       Balance                                             Pertinent Vitals/Pain Pain Assessment: 0-10 Pain Score: 2  Pain Location: L foot Pain Descriptors / Indicators: Sore Pain Intervention(s): Limited activity within patient's tolerance;Monitored during session;Premedicated before session    Home Living Family/patient expects to be discharged to:: Other (Comment) (Behavioral health) Living Arrangements: Alone                    Prior Function Level of Independence: Needs assistance         Comments: Pt has been investigating options including AL and moving closer to sister to allow her to assist more.  Pt with ltd use of UEs and hands     Hand Dominance        Extremity/Trunk Assessment   Upper Extremity Assessment: RUE deficits/detail;LUE deficits/detail RUE Deficits / Details: Significant limitations Bil shoulders and hands 2* RA changes  Pt states she can not reach to head and is unable to grasp onto walker or cane handles     LUE Deficits / Details: ~ as R UE   Lower Extremity Assessment: LLE deficits/detail   LLE Deficits / Details: Dressings in place L foot     Communication   Communication: No difficulties  Cognition Arousal/Alertness: Awake/alert Behavior During Therapy: WFL for tasks assessed/performed Overall Cognitive Status: Within Functional Limits for tasks assessed  General Comments      Exercises        Assessment/Plan    PT Assessment Patient needs continued PT services  PT Diagnosis Difficulty walking   PT Problem List Decreased strength;Decreased range of motion;Decreased activity tolerance;Decreased mobility;Decreased knowledge of use of DME;Pain;Decreased knowledge of precautions  PT Treatment Interventions DME instruction;Gait training;Functional mobility training;Therapeutic activities;Patient/family education   PT Goals (Current goals can be found in the Care Plan section) Acute Rehab PT Goals Patient Stated Goal: Dc to [facility]  to address other issues PT Goal Formulation: With patient Time For Goal Achievement: 12/17/15 Potential to Achieve Goals: Good    Frequency Min 3X/week   Barriers to discharge        Co-evaluation               End of Session Equipment Utilized During Treatment: Gait belt Activity Tolerance: Patient tolerated treatment well Patient left: in chair;with call bell/phone within reach Nurse Communication: Mobility status         Time: 1139-1203 PT Time Calculation (min) (ACUTE ONLY): 24 min   Charges:   PT Evaluation $PT Eval Low Complexity: 1 Procedure PT Treatments $Gait Training: 8-22 mins   PT G Codes:        Tara Brown 12/12/15, 1:23 PM

## 2015-12-08 NOTE — Care Management Important Message (Signed)
Important Message  Patient Details  Name: [name] MRN: [id number] Date of Birth: [birth year]   Medicare Important Message Given:  Yes    Haskell Flirt 12/08/2015, 9:14 AMImportant Message  Patient Details  Name: [name] MRN: [id number] Date of Birth: 1960-09-17   Medicare Important Message Given:  Yes    Haskell Flirt 12/08/2015, 9:14 AM

## 2015-12-08 NOTE — Progress Notes (Signed)
Physical Therapy Treatment Patient Details Name: Tara Brown MRN: [id number] DOB: 02-13-1961 Today's Date: 12/08/2015    History of Present Illness 55 yo female admitted through ED with depression.  Pt with hx of RA with significant limitations noted in UEs.  Pt s/p L great toe amputation 2* osteomyelitis 12/07/15    PT Comments    Pt very motivated but requiring frequent cueing to limit WB on L foot.  Follow Up Recommendations  Other (comment)     Equipment Recommendations  Rolling walker with 5" wheels (With Bil platforms)    Recommendations for Other Services OT consult     Precautions / Restrictions Precautions Precautions: Fall Precaution Comments: Limted use of UEs 2* RA changes Required Braces or Orthoses: Other Brace/Splint Other Brace/Splint: post op shoe on L Restrictions Weight Bearing Restrictions: Yes LLE Weight Bearing: Touchdown weight bearing    Mobility  Bed Mobility Overal bed mobility: Modified Independent             General bed mobility comments: Pt in/out bed unassisted  Transfers Overall transfer level: Needs assistance Equipment used: None Transfers: Sit to/from Stand Sit to Stand: Supervision         General transfer comment: cues to limit WB on L LE  Ambulation/Gait Ambulation/Gait assistance: Min guard Ambulation Distance (Feet): 60 Feet (and 15' twice to/from bathroom) Assistive device: Rolling walker (2 wheeled);Bilateral platform walker Gait Pattern/deviations: Step-to pattern;Shuffle;Trunk flexed Gait velocity: Cues to slow pace to maintain TDWB   General Gait Details: Cues for sequence, positon from RW and posture as well as frequent cues for TDWB and increased WB through elbows.     Stairs            Wheelchair Mobility    Modified Rankin (Stroke Patients Only)       Balance                                    Cognition Arousal/Alertness: Awake/alert Behavior During Therapy: WFL for  tasks assessed/performed Overall Cognitive Status: Within Functional Limits for tasks assessed                      Exercises      General Comments        Pertinent Vitals/Pain Pain Assessment: 0-10 Pain Score: 2  Pain Location: L foot Pain Descriptors / Indicators: Aching;Sore Pain Intervention(s): Limited activity within patient's tolerance;Monitored during session;Premedicated before session    Home Living                      Prior Function            PT Goals (current goals can now be found in the care plan section) Acute Rehab PT Goals Patient Stated Goal: Dc to [facility] to address other issues PT Goal Formulation: With patient Time For Goal Achievement: 12/17/15 Potential to Achieve Goals: Good Progress towards PT goals: Progressing toward goals    Frequency  Min 3X/week    PT Plan Current plan remains appropriate    Co-evaluation             End of Session Equipment Utilized During Treatment: Gait belt Activity Tolerance: Patient tolerated treatment well Patient left: in bed;with call bell/phone within reach;with family/visitor present     Time: 2585-2778 PT Time Calculation (min) (ACUTE ONLY): 20 min  Charges:  $Gait Training: 8-22 mins  G Codes:      Carr Shartzer December 27, 2015, 5:05 PM

## 2015-12-08 NOTE — Progress Notes (Addendum)
[facility]                                                                              Patient Demographics  Tara Brown, is a 55 y.o. female, DOB - 08-09-1961, TDV:[id number]  Admit date - 12/05/2015   Admitting Physician Bobette Mo, MD  Outpatient Primary MD for the patient is Vonna Kotyk  LOS - 3   Chief Complaint  Patient presents with  . Fatigue      Interim history 55 year old female with a PMH of rheumatoid/psoriatic arthritis, anemia, osteoporosis, depression, lives alone, mostly independent of activities of daily living but at times uses a cane for ambulation, presented to [facility] for evaluation of fatigue. She apparently has been noncompliant with her medications and has been under a lot of stress physically from her rheumatoid arthritis and financial stress. She is overwhelmed and contemplating suicide by drug OD but did not attempt. In the ED, noted to have open draining wound of her left big toe with hardware protruding out. X-ray showed loosening of the great toe first MTP joint prosthesis. Admitted for further management. Suicide precautions. Psychiatry and orthopedics consulted. S/P amputation of left great toe and first metatarsal on 12/07/15. DC to inpatient psychiatry when medically stable  Assessment & Plan   Cellulitis complicating chronic left big toe wound and osteomyelitis. -Patient states that she has had the wound for almost 9 months which intermittently drains and has been treated with several courses of antibiotics and her podiatrist plans to perform some surgical intervention. -X-ray shows loosening of the great toe first MTP joint prosthesis, further subluxation of the first metatarsal head prosthetic, component and underlying bone resorption. -Continue empirically started IV vancomycin and Zosyn. -Orthopedics/Dr. Lajoyce Corners consulted and patient underwent amputation of left great toe and first metatarsal on 3/28.  Can possibly discontinue all antibiotics 24 hours postop. -Wound culture pending. -PT and OT pending.  Suicidal ideation/major depressive disorder, recurrent, severe without psychotic features. -Suicide precautions -Patient denies suicidal ideations, will discontinue sitter -Psychiatry was consulted and discussed with psychiatry 3/27. They recommend acute psychiatric hospitalization once medically stable. -Continue Zoloft 150 MG daily.  Advanced rheumatoid and psoriatic arthritis -Continue methotrexate & prednisone. Patient states that she is also on Cimzia 400 mg SQ every 2 weeks which she has not taken since end of 2016. Outpatient follow-up with Dr. Kellie Simmering.  Essential hypertension -Controlled. Continue lisinopril.  Microcytic anemia/iron & B12 deficiency anemia -Iron 11, TIBC 193, saturation ratio 6, ferritin 44, folate 28.8 and B12: 150 -Continue iron and B12 supplements. -Hemoglobin today 7.7, continue to monitor CBC  Code Status: Lovenox  Family Communication: None at bedside  Disposition Plan: Admitted  Time Spent in minutes   30 minutes  Procedures  Amputation of left great toe and first metatarsal on 12/07/15  Consults   Psychiatry Orthopedic Surgery, Dr. Lajoyce Corners  DVT Prophylaxis  Lovenox  Lab Results  Component Value Date   PLT 307 12/08/2015    Medications  Scheduled Meds: . calcium-vitamin D  1 tablet Oral BID WC  . chlorhexidine  60 mL Topical Once  . docusate sodium  100 mg Oral BID  . enoxaparin (LOVENOX)  injection  40 mg Subcutaneous Q24H  . Ferrous Fumarate  1 tablet Oral Daily  . folic acid  1 mg Oral Daily  . lisinopril  40 mg Oral Daily  . meloxicam  15 mg Oral Daily  . [START ON 12/09/2015] methotrexate  7.5 mg Oral Once per day on Thu Fri  . pantoprazole  40 mg Oral Daily  . piperacillin-tazobactam (ZOSYN)  IV  3.375 g Intravenous Q8H  . povidone-iodine  2 application Topical Once  . predniSONE  10 mg Oral Q breakfast  . sertraline  150 mg  Oral Daily  . sodium chloride flush  3 mL Intravenous Q12H  . vancomycin  500 mg Intravenous Q12H  . vitamin B-12  1,000 mcg Oral Daily   Continuous Infusions: . sodium chloride 75 mL/hr at 12/08/15 0628  . sodium chloride     PRN Meds:.sodium chloride, acetaminophen **OR** acetaminophen, HYDROmorphone (DILAUDID) injection, methocarbamol **OR** methocarbamol (ROBAXIN)  IV, metoCLOPramide **OR** metoCLOPramide (REGLAN) injection, ondansetron **OR** ondansetron (ZOFRAN) IV, oxyCODONE, senna-docusate, zolpidem  Antibiotics    Anti-infectives    Start     Dose/Rate Route Frequency Ordered Stop   12/07/15 1330  ceFAZolin (ANCEF) IVPB 2g/100 mL premix     2 g 200 mL/hr over 30 Minutes Intravenous On call to O.R. 12/07/15 1308 12/08/15 0559   12/06/15 0800  vancomycin (VANCOCIN) IVPB 750 mg/150 ml premix  Status:  Discontinued     750 mg 150 mL/hr over 60 Minutes Intravenous Every 12 hours 12/06/15 0224 12/06/15 0230   12/06/15 0800  vancomycin (VANCOCIN) 500 mg in sodium chloride 0.9 % 100 mL IVPB     500 mg 100 mL/hr over 60 Minutes Intravenous Every 12 hours 12/06/15 0230     12/06/15 0300  piperacillin-tazobactam (ZOSYN) IVPB 3.375 g     3.375 g 12.5 mL/hr over 240 Minutes Intravenous Every 8 hours 12/06/15 0223     12/05/15 1745  vancomycin (VANCOCIN) IVPB 1000 mg/200 mL premix     1,000 mg 200 mL/hr over 60 Minutes Intravenous  Once 12/05/15 1743 12/05/15 2047   12/05/15 1745  piperacillin-tazobactam (ZOSYN) IVPB 3.375 g     3.375 g 12.5 mL/hr over 240 Minutes Intravenous  Once 12/05/15 1743 12/05/15 1932      Subjective:   [name] seen and examined today.  Patient states she is feeling ok today. Denies pain at this time.  Denies suicidal or homicidal ideations.  Patient denies dizziness, chest pain, shortness of breath, abdominal pain, N/V/D/C.  Objective:   Filed Vitals:   12/08/15 0610 12/08/15 0931 12/08/15 0938 12/08/15 1500  BP: 147/75 127/57  136/65  Pulse:  75  78 68  Temp: 98.2 F (36.8 C)  98.7 F (37.1 C) 98.2 F (36.8 C)  TempSrc: Oral  Oral Oral  Resp: 16  16 16   Height:      Weight:      SpO2: 100%  97% 99%    Wt Readings from Last 3 Encounters:  12/05/15 50.349 kg (111 lb)  09/11/15 48.081 kg (106 lb)  09/07/15 46.267 kg (102 lb)     Intake/Output Summary (Last 24 hours) at 12/08/15 1523 Last data filed at 12/08/15 1237  Gross per 24 hour  Intake 1015.83 ml  Output      0 ml  Net 1015.83 ml    Exam  General: Well developed, well nourished, NAD, appears stated age  HEENT: NCAT, mucous membranes moist.   Cardiovascular: S1 S2 auscultated, no murmurs, RRR  Respiratory: Clear to auscultation bilaterally   Abdomen: Soft, nontender, nondistended, + bowel sounds  Extremities: warm dry without cyanosis clubbing or edema. Left foot wrapped.  Neuro: AAOx3, nonfocal  Psych: Flat affect  Data Review   Micro Results Recent Results (from the past 240 hour(s))  Wound culture     Status: None   Collection Time: 12/05/15  5:30 PM  Result Value Ref Range Status   Specimen Description FOOT LEFT  Final   Special Requests Immunocompromised  Final   Gram Stain   Final    RARE WBC NO SQUAMOUS EPITHELIAL CELLS SEEN FEW GRAM POSITIVE COCCI IN PAIRS Performed at [facility]    Culture   Final    ABUNDANT STAPHYLOCOCCUS AUREUS Note: RIFAMPIN AND GENTAMICIN SHOULD NOT BE USED AS SINGLE DRUGS FOR TREATMENT OF STAPH INFECTIONS. Performed at [facility]    Report Status 12/08/2015 FINAL  Final   Organism ID, Bacteria STAPHYLOCOCCUS AUREUS  Final      Susceptibility   Staphylococcus aureus - MIC*    CLINDAMYCIN <=0.25 SENSITIVE Sensitive     ERYTHROMYCIN <=0.25 SENSITIVE Sensitive     GENTAMICIN <=0.5 SENSITIVE Sensitive     LEVOFLOXACIN <=0.12 SENSITIVE Sensitive     OXACILLIN 0.5 SENSITIVE Sensitive     RIFAMPIN <=0.5 SENSITIVE Sensitive     TRIMETH/SULFA <=10 SENSITIVE Sensitive     VANCOMYCIN 1  SENSITIVE Sensitive     TETRACYCLINE >=16 RESISTANT Resistant     MOXIFLOXACIN <=0.25 SENSITIVE Sensitive     * ABUNDANT STAPHYLOCOCCUS AUREUS  Surgical pcr screen     Status: None   Collection Time: 12/07/15 10:24 AM  Result Value Ref Range Status   MRSA, PCR NEGATIVE NEGATIVE Final   Staphylococcus aureus NEGATIVE NEGATIVE Final    Comment:        The Xpert SA Assay (FDA approved for NASAL specimens in patients over 11 years of age), is one component of a comprehensive surveillance program.  Test performance has been validated by [facility] for patients greater than or equal to 55 year old. It is not intended to diagnose infection nor to guide or monitor treatment.     Radiology Reports Dg Foot Complete Left  12/05/2015  CLINICAL DATA:  Left great toe redness and swelling. Hardware protruding medially. EXAM: LEFT FOOT - COMPLETE 3+ VIEW COMPARISON:  09/01/2015 FINDINGS: The prosthetic component at the distal first metatarsal has subluxed for error in the dorsal/medial direction. The underlying bone shows significant resorption. Little bone contacts the hardware. There is also lucency surrounding the prosthetic component at the base of the great toe proximal phalanx. This has mildly increased from the prior study. There is resorption of the heads of the second through fifth metatarsals, most prominently the second. Second metatarsophalangeal joint is dislocated with proximal phalanx projecting dorsal to the distal second metatarsal. There are resorption zone the proximal phalanges of the fourth and fifth toes. There is partial resorption of the middle phalanx of the fourth toe. Bones are diffusely demineralized. Soft tissues are unremarkable. There is no acute fracture. IMPRESSION: 1. Loosening of the great toe first MTP joint prosthesis. There has been further subluxation of the first metatarsal head prosthetic component. There is more underlying bone resorption than there was on the  prior study. 2. Other changes of forefoot erosions are stable from the prior exam. No acute fracture. Electronically Signed   By: Amie Portland M.D.   On: 12/05/2015 18:30    CBC  Recent Labs  Lab 12/05/15 1730 12/06/15 0417 12/07/15 0418 12/08/15 0403  WBC 7.1 5.5 6.7 7.6  HGB 9.6* 8.3* 8.4* 7.7*  HCT 31.1* 27.0* 27.6* 24.9*  PLT 345 265 309 307  MCV 75.3* 76.3* 76.7* 76.4*  MCH 23.2* 23.4* 23.3* 23.6*  MCHC 30.9 30.7 30.4 30.9  RDW 20.3* 20.2* 20.2* 20.1*  LYMPHSABS 2.1 1.8 1.7 2.0  MONOABS 0.6 0.4 0.5 0.5  EOSABS 0.1 0.2 0.1 0.1  BASOSABS 0.0 0.0 0.0 0.0    Chemistries   Recent Labs Lab 12/05/15 1730 12/06/15 0417  NA 139 142  K 3.5 3.7  CL 109 110  CO2 23 25  GLUCOSE 79 96  BUN 17 18  CREATININE 0.77 0.84  CALCIUM 8.8* 8.4*  MG  --  2.1  AST  --  13*  ALT  --  8*  ALKPHOS  --  70  BILITOT  --  0.4   ------------------------------------------------------------------------------------------------------------------ estimated creatinine clearance is 59.4 mL/min (by C-G formula based on Cr of 0.84). ------------------------------------------------------------------------------------------------------------------ No results for input(s): HGBA1C in the last 72 hours. ------------------------------------------------------------------------------------------------------------------ No results for input(s): CHOL, HDL, LDLCALC, TRIG, CHOLHDL, LDLDIRECT in the last 72 hours. ------------------------------------------------------------------------------------------------------------------ No results for input(s): TSH, T4TOTAL, T3FREE, THYROIDAB in the last 72 hours.  Invalid input(s): FREET3 ------------------------------------------------------------------------------------------------------------------  Recent Labs  12/06/15 0417  VITAMINB12 150*  FOLATE 28.8  FERRITIN 44  TIBC 193*  IRON 11*  RETICCTPCT 0.7    Coagulation profile No results for input(s):  INR, PROTIME in the last 168 hours.  No results for input(s): DDIMER in the last 72 hours.  Cardiac Enzymes No results for input(s): CKMB, TROPONINI, MYOGLOBIN in the last 168 hours.  Invalid input(s): CK ------------------------------------------------------------------------------------------------------------------ Invalid input(s): POCBNP    Draxton Luu D.O. on 12/08/2015 at 3:23 PM  Between 7am to 7pm - Pager - [phone]  After 7pm go to [email or web address] - password [email or web address]  And look for the night coverage person covering for me after hours  [facility] Office  [phone]

## 2015-12-09 DIAGNOSIS — I10 Essential (primary) hypertension: Secondary | ICD-10-CM

## 2015-12-09 DIAGNOSIS — D638 Anemia in other chronic diseases classified elsewhere: Secondary | ICD-10-CM

## 2015-12-09 LAB — CBC WITH DIFFERENTIAL/PLATELET
Basophils Absolute: 0 10*3/uL (ref 0.0–0.1)
Basophils Relative: 0 %
Eosinophils Absolute: 0.2 10*3/uL (ref 0.0–0.7)
Eosinophils Relative: 3 %
HCT: 25.8 % — ABNORMAL LOW (ref 36.0–46.0)
Hemoglobin: 7.9 g/dL — ABNORMAL LOW (ref 12.0–15.0)
LYMPHS ABS: 2.2 10*3/uL (ref 0.7–4.0)
LYMPHS PCT: 37 %
MCH: 23.3 pg — AB (ref 26.0–34.0)
MCHC: 30.6 g/dL (ref 30.0–36.0)
MCV: 76.1 fL — AB (ref 78.0–100.0)
MONO ABS: 0.5 10*3/uL (ref 0.1–1.0)
Monocytes Relative: 8 %
Neutro Abs: 3.2 10*3/uL (ref 1.7–7.7)
Neutrophils Relative %: 52 %
PLATELETS: 291 10*3/uL (ref 150–400)
RBC: 3.39 MIL/uL — ABNORMAL LOW (ref 3.87–5.11)
RDW: 20 % — AB (ref 11.5–15.5)
WBC: 6 10*3/uL (ref 4.0–10.5)

## 2015-12-09 MED ORDER — ZOLPIDEM TARTRATE 10 MG PO TABS: 10.0000 mg | ORAL_TABLET | Freq: Every evening | ORAL | Status: DC | PRN

## 2015-12-09 MED ORDER — TRAZODONE HCL 50 MG PO TABS
50.0000 mg | ORAL_TABLET | Freq: Every day | ORAL | Status: DC
  Administered 2015-12-09 – 2015-12-13 (×5): 50 mg via ORAL
  Filled 2015-12-09 (×7): qty 1

## 2015-12-09 MED ORDER — SALINE SPRAY 0.65 % NA SOLN
1.0000 | NASAL | Status: DC | PRN
  Filled 2015-12-09: qty 44

## 2015-12-09 MED ORDER — SERTRALINE HCL 100 MG PO TABS
200.0000 mg | ORAL_TABLET | Freq: Every day | ORAL | Status: DC
  Administered 2015-12-10 – 2015-12-14 (×5): 200 mg via ORAL
  Filled 2015-12-09 (×5): qty 2

## 2015-12-09 MED ORDER — ZOLPIDEM TARTRATE 5 MG PO TABS
5.0000 mg | ORAL_TABLET | Freq: Once | ORAL | Status: AC
  Administered 2015-12-09: 5 mg via ORAL
  Filled 2015-12-09: qty 1

## 2015-12-09 NOTE — Clinical Social Work Note (Signed)
CSW reviewed psychiatric note reflecting psychiatric inpatient treatment at discharge.  CSW called [facility] and no beds today.  [facility] may have beds tomorrow.   Elray Buba, LCSW [facility] Clinical Social Worker - Weekend Coverage cell #: [phone]

## 2015-12-09 NOTE — Evaluation (Signed)
Occupational Therapy Evaluation Patient Details Name: Tara Brown MRN: [id number] DOB: [birth year] Today's Date: 12/09/2015    History of Present Illness 55 yo female admitted through ED with depression.  Pt with hx of RA with significant limitations noted in UEs.  Pt s/p L great toe amputation 2* osteomyelitis 12/07/15   Clinical Impression   Pt was admitted for the above.  She was mod I at home for most adls and just made adapations as needed. She currently needs min A for LB adls and supervision/cues for weight bearing for sit to stand, transfers and LB adls.  Goals in acute are for supervision level    Follow Up Recommendations   (plan is for [facility])    Equipment Recommendations  None recommended by OT    Recommendations for Other Services       Precautions / Restrictions Precautions Precautions: Fall Precaution Comments: Limted use of UEs 2* RA changes Required Braces or Orthoses: Other Brace/Splint Other Brace/Splint: post op shoe on L Restrictions Weight Bearing Restrictions: No LLE Weight Bearing: Touchdown weight bearing      Mobility Bed Mobility Overal bed mobility: Modified Independent                Transfers     Transfers: Sit to/from Stand Sit to Stand: Supervision         General transfer comment: cues to limit WB on L LE    Balance                                            ADL Overall ADL's : Needs assistance/impaired Eating/Feeding: Set up;Sitting   Grooming: Sitting (set up except for hair)   Upper Body Bathing: Set up;Sitting   Lower Body Bathing: Minimal assistance;Sit to/from stand   Upper Body Dressing : Set up;Sitting (extra time)   Lower Body Dressing: Moderate assistance;Sit to/from stand                 General ADL Comments: Pt needs a little more assistance for LB adls--needs reacher or dressing stick to start pants over L foot--can bring R up to reach. She has been unable to comb hair  even before sx and unable to don socks--can't use sock aide.  She is able to hold plastic utensils but also has foam in room (provided by PT).  Needs cueing for WB on RLE.  Pt had just used bathroom prior to OTs arrival     Vision     Perception     Praxis      Pertinent Vitals/Pain Pain Score: 4  Pain Location: L foot Pain Descriptors / Indicators: Aching Pain Intervention(s): Limited activity within patient's tolerance;Monitored during session;Repositioned;Patient requesting pain meds-RN notified     Hand Dominance Right   Extremity/Trunk Assessment Upper Extremity Assessment RUE Deficits / Details: can lift approximately 60 degrees; almost full elbow extension; wrist fused; finger with limited movement (less then L) and arthritic changes LUE Deficits / Details: LUE can lift approximately 70 degrees; elbow extension -20; wrist fused; more hand motion but still has arthritic changes           Communication Communication Communication: No difficulties   Cognition Arousal/Alertness: Awake/alert Behavior During Therapy: WFL for tasks assessed/performed Overall Cognitive Status: Within Functional Limits for tasks assessed  General Comments       Exercises       Shoulder Instructions      Home Living Family/patient expects to be discharged to::  [facility]) Living Arrangements: Alone                                      Prior Functioning/Environment Level of Independence: Needs assistance        Comments: has made as many adaptations as possible; cannot comb hair; has friends do little things when they come over    OT Diagnosis: Acute pain   OT Problem List: Decreased range of motion;Pain;Impaired UE functional use;Decreased knowledge of use of DME or AE;Decreased strength   OT Treatment/Interventions: Self-care/ADL training;DME and/or AE instruction;Patient/family education;Therapeutic activities    OT Goals(Current  goals can be found in the care plan section) Acute Rehab OT Goals Patient Stated Goal: be as independent as possible OT Goal Formulation: With patient Time For Goal Achievement: 12/16/15 Potential to Achieve Goals: Good ADL Goals Pt Will Perform Lower Body Bathing: with supervision;with adaptive equipment;sit to/from stand Pt Will Perform Lower Body Dressing: with supervision;with adaptive equipment;sit to/from stand (pants/underwear) Pt Will Transfer to Toilet: with supervision;ambulating;bedside commode (and hygiene)  OT Frequency: Min 2X/week   Barriers to D/C:            Co-evaluation              End of Session    Activity Tolerance: Patient limited by pain Patient left: in bed;with call bell/phone within reach;with nursing/sitter in room   Time: 4166-0630 OT Time Calculation (min): 20 min Charges:  OT General Charges $OT Visit: 1 Procedure OT Evaluation $OT Eval Moderate Complexity: 1 Procedure G-Codes:    Fayola Meckes 08-Jan-2016, 11:26 AM  Marica Otter, OTR/L [phone] 08-Jan-2016

## 2015-12-09 NOTE — Progress Notes (Signed)
PROGRESS NOTE    Tara Brown  LKJ:[id number]  DOB: 1960-12-20  DOA: 12/05/2015 PCP: [facility] Outpatient Specialists: Rheumatology: Dr. Stacey Drain Podiatry: Dr. Toni Arthurs course: 55 year old female with a PMH of rheumatoid/psoriatic arthritis, anemia, osteoporosis, depression, lives alone, mostly independent of activities of daily living but at times uses a cane for ambulation, presented [facility] for evaluation of fatigue. She apparently has been noncompliant with her medications and has been under a lot of stress physically from her rheumatoid arthritis and financial stress. She is overwhelmed and contemplating suicide by drug OD but did not attempt. In the ED, noted to have open draining wound of her left big toe with hardware protruding out. X-ray showed loosening of the great toe first MTP joint prosthesis. Admitted for further management. Suicide precautions. Psychiatry and orthopedics consulted. S/P amputation of left great toe and first metatarsal on 12/07/15. DC to inpatient psychiatry when medically stable.   Assessment & Plan:   Cellulitis complicating chronic left big toe wound and osteomyelitis. - Patient states that she has had the wound for almost 9 months which intermittently drains and has been treated with several courses of antibiotics and her podiatrist plans to perform some surgical intervention. - X-ray shows loosening of the great toe first MTP joint prosthesis, further subluxation of the first metatarsal head prosthetic, component and underlying bone resorption. - Continue empirically started IV vancomycin and Zosyn. - Orthopedics/Dr. Lajoyce Corners consulted and patient underwent amputation of left great toe and first metatarsal on 3/28.  - Wound culture MSSA. - Discussed with Dr. Lajoyce Corners 3/30: Recommended discontinuing antibiotics, keep current dressing until outpatient follow-up with him in 1 week, weightbearing as  tolerated and cleared from him for discharge.  Suicidal ideation/major depressive disorder, recurrent, severe without psychotic features. - Suicide precautions & 1:1 Recruitment consultant. - Psychiatry was consulted and discussed with psychiatry 3/27. They recommend acute psychiatric hospitalization once medically stable. - Continue Zoloft 150 MG daily. - Patient apparently reported no suicidal ideations on 3/29 and suicide precautions had been discontinued. However on speaking with her today, patient clearly indicates that she continues to have suicidal ideations without definite plans. Reinstated suicidal precautions and 1:1 sitter. Contacted psychiatry to reevaluate and social worker to assist with inpatient psychiatry bed.  Advanced rheumatoid and psoriatic arthritis - Continue methotrexate & prednisone. Patient states that she is also on Cimzia 400 mg SQ every 2 weeks which she has not taken since end of 2016. Outpatient follow-up with Dr. Kellie Simmering.  Essential hypertension - Controlled. Continue lisinopril.  Microcytic anemia/iron & B12 deficiency anemia - Iron 11, TIBC 193, saturation ratio 6, ferritin 44, folate 28.8 and B12: 150 - Started iron and B12 supplements.    DVT prophylaxis: Lovenox Code Status: Full Family Communication: None at bedside Disposition Plan: DC to inpatient psychiatry when medically stable and maybe to ALF from [facility]. Discussed with clinical Child psychotherapist.   Consultants:  Psychiatry  Orthopedics/Dr. Lajoyce Corners  Procedures:  Amputation of left great toe and first metatarsal on 12/07/15  Antimicrobials:  IV Zosyn 3/26 > 3/30  IV vancomycin 3/26 > 3/30  Subjective: Denies pain in left toe. Continues to have suicidal ideations without clear plan.  Objective: Filed Vitals:   12/08/15 2153 12/09/15 0525 12/09/15 1011 12/09/15 1419  BP: 120/59 139/82 134/59 118/63  Pulse: 63 64 74 71  Temp: 98.1 F (36.7 C) 98.2 F (36.8 C)  99 F (37.2 C)  TempSrc: Oral  Oral  Oral  Resp: 16 16  16   Height:      Weight:      SpO2: 98% 98%  97%    Intake/Output Summary (Last 24 hours) at 12/09/15 1603 Last data filed at 12/09/15 1357  Gross per 24 hour  Intake   1320 ml  Output      0 ml  Net   1320 ml   Filed Weights   12/05/15 1555  Weight: 50.349 kg (111 lb)    Exam:  General exam: Small built and frail middle-aged female lying comfortably supine in bed. Suicide sitter at bedside. Respiratory system: Clear. No increased work of breathing. Cardiovascular system: S1 & S2 heard, RRR. No JVD, murmurs, gallops, clicks or pedal edema. Gastrointestinal system: Abdomen is nondistended, soft and nontender. Normal bowel sounds heard. Central nervous system: Alert and oriented. No focal neurological deficits. Extremities: Symmetric 5 x 5 power. Left foot dressing clean and dry.     Data Reviewed: Basic Metabolic Panel:  Recent Labs Lab 12/05/15 1730 12/06/15 0417  NA 139 142  K 3.5 3.7  CL 109 110  CO2 23 25  GLUCOSE 79 96  BUN 17 18  CREATININE 0.77 0.84  CALCIUM 8.8* 8.4*  MG  --  2.1  PHOS  --  4.6   Liver Function Tests:  Recent Labs Lab 12/06/15 0417  AST 13*  ALT 8*  ALKPHOS 70  BILITOT 0.4  PROT 6.7  ALBUMIN 2.9*   No results for input(s): LIPASE, AMYLASE in the last 168 hours. No results for input(s): AMMONIA in the last 168 hours. CBC:  Recent Labs Lab 12/05/15 1730 12/06/15 0417 12/07/15 0418 12/08/15 0403 12/09/15 0402  WBC 7.1 5.5 6.7 7.6 6.0  NEUTROABS 4.3 3.2 4.4 5.1 3.2  HGB 9.6* 8.3* 8.4* 7.7* 7.9*  HCT 31.1* 27.0* 27.6* 24.9* 25.8*  MCV 75.3* 76.3* 76.7* 76.4* 76.1*  PLT 345 265 309 307 291   Cardiac Enzymes: No results for input(s): CKTOTAL, CKMB, CKMBINDEX, TROPONINI in the last 168 hours. BNP (last 3 results) No results for input(s): PROBNP in the last 8760 hours. CBG: No results for input(s): GLUCAP in the last 168 hours.  Recent Results (from the past 240 hour(s))  Wound culture      Status: None   Collection Time: 12/05/15  5:30 PM  Result Value Ref Range Status   Specimen Description FOOT LEFT  Final   Special Requests Immunocompromised  Final   Gram Stain   Final    RARE WBC NO SQUAMOUS EPITHELIAL CELLS SEEN FEW GRAM POSITIVE COCCI IN PAIRS Performed at 12/07/15    Culture   Final    ABUNDANT STAPHYLOCOCCUS AUREUS Note: RIFAMPIN AND GENTAMICIN SHOULD NOT BE USED AS SINGLE DRUGS FOR TREATMENT OF STAPH INFECTIONS. Performed at [facility]    Report Status 12/08/2015 FINAL  Final   Organism ID, Bacteria STAPHYLOCOCCUS AUREUS  Final      Susceptibility   Staphylococcus aureus - MIC*    CLINDAMYCIN <=0.25 SENSITIVE Sensitive     ERYTHROMYCIN <=0.25 SENSITIVE Sensitive     GENTAMICIN <=0.5 SENSITIVE Sensitive     LEVOFLOXACIN <=0.12 SENSITIVE Sensitive     OXACILLIN 0.5 SENSITIVE Sensitive     RIFAMPIN <=0.5 SENSITIVE Sensitive     TRIMETH/SULFA <=10 SENSITIVE Sensitive     VANCOMYCIN 1 SENSITIVE Sensitive     TETRACYCLINE >=16 RESISTANT Resistant     MOXIFLOXACIN <=0.25 SENSITIVE Sensitive     * ABUNDANT STAPHYLOCOCCUS AUREUS  Surgical pcr  screen     Status: None   Collection Time: 12/07/15 10:24 AM  Result Value Ref Range Status   MRSA, PCR NEGATIVE NEGATIVE Final   Staphylococcus aureus NEGATIVE NEGATIVE Final    Comment:        The Xpert SA Assay (FDA approved for NASAL specimens in patients over 31 years of age), is one component of a comprehensive surveillance program.  Test performance has been validated by [facility] for patients greater than or equal to 52 year old. It is not intended to diagnose infection nor to guide or monitor treatment.          Studies: No results found.      Scheduled Meds: . calcium-vitamin D  1 tablet Oral BID WC  . chlorhexidine  60 mL Topical Once  . docusate sodium  100 mg Oral BID  . enoxaparin (LOVENOX) injection  40 mg Subcutaneous Q24H  . Ferrous Fumarate  1 tablet Oral  Daily  . folic acid  1 mg Oral Daily  . lisinopril  40 mg Oral Daily  . meloxicam  15 mg Oral Daily  . methotrexate  7.5 mg Oral Once per day on Thu Fri  . pantoprazole  40 mg Oral Daily  . piperacillin-tazobactam (ZOSYN)  IV  3.375 g Intravenous Q8H  . povidone-iodine  2 application Topical Once  . predniSONE  10 mg Oral Q breakfast  . [START ON 12/10/2015] sertraline  200 mg Oral Daily  . sodium chloride flush  3 mL Intravenous Q12H  . traZODone  50 mg Oral QHS  . vancomycin  500 mg Intravenous Q12H  . vitamin B-12  1,000 mcg Oral Daily   Continuous Infusions: . sodium chloride 75 mL/hr at 12/08/15 0628  . sodium chloride      Principal Problem:   Major depressive disorder, recurrent, severe without psychotic features (HCC) Active Problems:   Anemia of chronic disease   Essential hypertension   Chronic osteomyelitis (HCC)   Cellulitis of toe, left   Left foot infection   Suicidal ideations   Rheumatoid arthritis (HCC)    Time spent: 20 minutes.    Marcellus Scott, MD, FACP, FHM. [facility] Pager [phone]  If 7PM-7AM, please contact night-coverage [email or web address] Password [email or web address] 12/09/2015, 4:03 PM    LOS: 4 days

## 2015-12-09 NOTE — Consult Note (Signed)
[facility] Face-to-Face Psychiatry Consult Follow Up  Reason for Consult:  Depression and suicide ideation Referring Physician:  Dr. Waymon Amato Patient Identification: Tara Brown MRN:  [id number] Principal Diagnosis: Major depressive disorder, recurrent, severe without psychotic features [facility]) Diagnosis:   Patient Active Problem List   Diagnosis Date Noted  . Left foot infection [L08.9] 12/06/2015  . Suicidal ideations [R45.851] 12/06/2015  . Rheumatoid arthritis (HCC) [M06.9] 12/06/2015  . Foot infection [L08.9] 12/05/2015  . Severe episode of recurrent major depressive disorder, without psychotic features (HCC) [F33.2]   . MDD (major depressive disorder), recurrent episode, severe (HCC) [F33.2] 09/11/2015  . Cellulitis of toe, left [L03.032]   . Chronic osteomyelitis (HCC) [M86.60] 09/07/2015  . Anemia, iron deficiency [D50.9]   . Essential hypertension [I10]   . Major depressive disorder, recurrent, severe without psychotic features (HCC) [F33.2] 03/11/2015  . Anemia of chronic disease [D63.8] 03/08/2015    Total Time spent with patient: 30 minutes  Subjective:   Tara Brown is a 55 y.o. female patient admitted with depression.  HPI:  Tara Brown is a 55 y.o. Female seen, chart reviewed and case discussed with the hospitalist. Patient has been suffering with past medical history rheumatoid arthritis, major depressive disorder, hypertension and admitted to the [facility] for increased symptoms of fatigue, loss of interest, loss of appetite, disturbed sleep over 4 weeks. Patient also reported she has been thinking about ending her life by taking intentional overdose of medication. She also reported she has no medication for the last 2-4 weeks because she cannot afford to pay the copayment. Patient also has a difficult to to access her primary care physician at [facility] who has been prescribing her medication. Patient reportedly want to change the primary  care physician and has problem with fund Center this time. Patient's sister was in [place] and patient father also supportive to her and occasionally provided limited financial support. Patient started feeling isolated, withdrawn and ruminating about worthlessness and how to end her life because of overwhelmed with the disability of rheumatoid arthritis, and psoriatic arthritis. Review of medical records indicated the patient was admitted to behavioral Health Center 3 times during the 2016 and patient is willing to be placed again as she like the service is the past. Patient is willing for voluntarily admission when bed is available. Past Psychiatric History: Major depressive disorder, recurrent and has multiple acute psychiatric hospitalization at behavioral Health Center.  Interval history: Patient seen face-to-face for psychiatric consultation follow-up today. Patient reported increased symptoms of depression, stress related to loss of part of her foot and feels she needed medication change and also a Agree with voluntarily inpatient hospitalization at behavioral Health Center when medically stable. She underwent surgery for her nonhealing foot ulcer. Patient continued to endorse suicidal ideation, hopelessness and helplessness. Patient is somewhat irritable when suggested she may able to go home with medication adjustment and unable to contract for safety.  Risk to Self: Is patient at risk for suicide?: Yes Risk to Others:   Prior Inpatient Therapy:   Prior Outpatient Therapy:    Past Medical History:  Past Medical History  Diagnosis Date  . Major depressive disorder, recurrent, severe without psychotic features (HCC) 03/11/2015    Past Surgical History  Procedure Laterality Date  . Hip fracture surgery    . Knee surgery    . Foot surgery    . Hand surgery    . Neck surgery    . Amputation Left 12/07/2015  Procedure: AMPUTATION LEFT GREAT TOE;  Surgeon: Nadara Mustard, MD;  Location:  [facility];  Service: Orthopedics;  Laterality: Left;   Family History:  Family History  Problem Relation Age of Onset  . Stroke Neg Hx    Family Psychiatric  History: Unknown Social History:  History  Alcohol Use  . Yes    Comment: Rarely     History  Drug Use No    Social History   Social History  . Marital Status: Divorced    Spouse Name: N/A  . Number of Children: N/A  . Years of Education: N/A   Social History Main Topics  . Smoking status: Never Smoker   . Smokeless tobacco: Never Used  . Alcohol Use: Yes     Comment: Rarely  . Drug Use: No  . Sexual Activity: No   Other Topics Concern  . None   Social History Narrative   Additional Social History:    Allergies:  No Known Allergies  Labs:  Results for orders placed or performed during the hospital encounter of 12/05/15 (from the past 48 hour(s))  CBC WITH DIFFERENTIAL     Status: Abnormal   Collection Time: 12/08/15  4:03 AM  Result Value Ref Range   WBC 7.6 4.0 - 10.5 K/uL   RBC 3.26 (L) 3.87 - 5.11 MIL/uL   Hemoglobin 7.7 (L) 12.0 - 15.0 g/dL   HCT 41.3 (L) 24.4 - 01.0 %   MCV 76.4 (L) 78.0 - 100.0 fL   MCH 23.6 (L) 26.0 - 34.0 pg   MCHC 30.9 30.0 - 36.0 g/dL   RDW 27.2 (H) 53.6 - 64.4 %   Platelets 307 150 - 400 K/uL   Neutrophils Relative % 66 %   Neutro Abs 5.1 1.7 - 7.7 K/uL   Lymphocytes Relative 26 %   Lymphs Abs 2.0 0.7 - 4.0 K/uL   Monocytes Relative 7 %   Monocytes Absolute 0.5 0.1 - 1.0 K/uL   Eosinophils Relative 1 %   Eosinophils Absolute 0.1 0.0 - 0.7 K/uL   Basophils Relative 0 %   Basophils Absolute 0.0 0.0 - 0.1 K/uL  CBC WITH DIFFERENTIAL     Status: Abnormal   Collection Time: 12/09/15  4:02 AM  Result Value Ref Range   WBC 6.0 4.0 - 10.5 K/uL   RBC 3.39 (L) 3.87 - 5.11 MIL/uL   Hemoglobin 7.9 (L) 12.0 - 15.0 g/dL   HCT 03.4 (L) 74.2 - 59.5 %   MCV 76.1 (L) 78.0 - 100.0 fL   MCH 23.3 (L) 26.0 - 34.0 pg   MCHC 30.6 30.0 - 36.0 g/dL   RDW 63.8 (H) 75.6 - 43.3 %    Platelets 291 150 - 400 K/uL   Neutrophils Relative % 52 %   Neutro Abs 3.2 1.7 - 7.7 K/uL   Lymphocytes Relative 37 %   Lymphs Abs 2.2 0.7 - 4.0 K/uL   Monocytes Relative 8 %   Monocytes Absolute 0.5 0.1 - 1.0 K/uL   Eosinophils Relative 3 %   Eosinophils Absolute 0.2 0.0 - 0.7 K/uL   Basophils Relative 0 %   Basophils Absolute 0.0 0.0 - 0.1 K/uL    Current Facility-Administered Medications  Medication Dose Route Frequency Provider Last Rate Last Dose  . 0.45 % sodium chloride infusion   Intravenous Continuous Nadara Mustard, MD 75 mL/hr at 12/08/15 [phone]    . 0.9 %  sodium chloride infusion  250 mL Intravenous PRN Doug Sou, MD 10  mL/hr at 12/05/15 1733 250 mL at 12/05/15 1733  . 0.9 %  sodium chloride infusion   Intravenous Continuous Aldean Baker V, MD      . acetaminophen (TYLENOL) tablet 650 mg  650 mg Oral Q6H PRN Nadara Mustard, MD       Or  . acetaminophen (TYLENOL) suppository 650 mg  650 mg Rectal Q6H PRN Nadara Mustard, MD      . calcium-vitamin D (OSCAL WITH D) 500-200 MG-UNIT per tablet 1 tablet  1 tablet Oral BID WC Bobette Mo, MD   1 tablet at 12/09/15 0800  . chlorhexidine (HIBICLENS) 4 % liquid 4 application  60 mL Topical Once Aldean Baker V, MD      . docusate sodium (COLACE) capsule 100 mg  100 mg Oral BID Elease Etienne, MD   100 mg at 12/09/15 1008  . enoxaparin (LOVENOX) injection 40 mg  40 mg Subcutaneous Q24H Bobette Mo, MD   40 mg at 12/09/15 1009  . Ferrous Fumarate (HEMOCYTE - 106 mg FE) tablet 106 mg of iron  1 tablet Oral Daily Elease Etienne, MD   106 mg of iron at 12/09/15 1008  . folic acid (FOLVITE) tablet 1 mg  1 mg Oral Daily Bobette Mo, MD   1 mg at 12/09/15 1007  . HYDROmorphone (DILAUDID) injection 1 mg  1 mg Intravenous Q2H PRN Nadara Mustard, MD   1 mg at 12/07/15 2054  . lisinopril (PRINIVIL,ZESTRIL) tablet 40 mg  40 mg Oral Daily Bobette Mo, MD   40 mg at 12/09/15 1007  . meloxicam (MOBIC) tablet 15 mg  15 mg  Oral Daily Bobette Mo, MD   15 mg at 12/09/15 1008  . methocarbamol (ROBAXIN) tablet 500 mg  500 mg Oral Q6H PRN Nadara Mustard, MD   500 mg at 12/09/15 1007   Or  . methocarbamol (ROBAXIN) 500 mg in dextrose 5 % 50 mL IVPB  500 mg Intravenous Q6H PRN Nadara Mustard, MD      . methotrexate (RHEUMATREX) tablet 7.5 mg  7.5 mg Oral Once per day on Thu Fri Bobette Mo, MD   Stopped at 12/09/15 0800  . metoCLOPramide (REGLAN) tablet 5-10 mg  5-10 mg Oral Q8H PRN Nadara Mustard, MD       Or  . metoCLOPramide (REGLAN) injection 5-10 mg  5-10 mg Intravenous Q8H PRN Nadara Mustard, MD      . ondansetron [facility]) tablet 4 mg  4 mg Oral Q6H PRN Nadara Mustard, MD       Or  . ondansetron [facility]) injection 4 mg  4 mg Intravenous Q6H PRN Aldean Baker V, MD      . oxyCODONE (Oxy IR/ROXICODONE) immediate release tablet 5-10 mg  5-10 mg Oral Q4H PRN Maryann Mikhail, DO   10 mg at 12/09/15 1008  . pantoprazole (PROTONIX) EC tablet 40 mg  40 mg Oral Daily Bobette Mo, MD   40 mg at 12/09/15 1008  . piperacillin-tazobactam (ZOSYN) IVPB 3.375 g  3.375 g Intravenous Q8H Leann T Poindexter, RPH   3.375 g at 12/09/15 0255  . povidone-iodine 10 % swab 2 application  2 application Topical Once Aldean Baker V, MD      . predniSONE (DELTASONE) tablet 10 mg  10 mg Oral Q breakfast Bobette Mo, MD   10 mg at 12/09/15 0800  . senna-docusate (Senokot-S) tablet 1 tablet  1 tablet Oral  QHS PRN Bobette Mo, MD      . sertraline (ZOLOFT) tablet 150 mg  150 mg Oral Daily Bobette Mo, MD   150 mg at 12/09/15 1008  . sodium chloride flush (NS) 0.9 % injection 3 mL  3 mL Intravenous Q12H Doug Sou, MD   3 mL at 12/08/15 2016  . vancomycin (VANCOCIN) 500 mg in sodium chloride 0.9 % 100 mL IVPB  500 mg Intravenous Q12H Leann T Poindexter, RPH   500 mg at 12/09/15 0800  . vitamin B-12 (CYANOCOBALAMIN) tablet 1,000 mcg  1,000 mcg Oral Daily Elease Etienne, MD   1,000 mcg at 12/09/15 1008  .  zolpidem (AMBIEN) tablet 10 mg  10 mg Oral QHS PRN Leda Gauze, NP        Musculoskeletal: Strength & Muscle Tone: decreased Gait & Station: unable to stand Patient leans: N/A  Psychiatric Specialty Exam: ROS:  Blood pressure 134/59, pulse 74, temperature 98.2 F (36.8 C), temperature source Oral, resp. rate 16, height 5\' 6"  (1.676 m), weight 50.349 kg (111 lb), SpO2 98 %.Body mass index is 17.92 kg/(m^2).  General Appearance: Casual  Eye Contact::  Good  Speech:  Clear and Coherent  Volume:  Normal  Mood:  Anxious, Depressed and Worthless  Affect:  Congruent and Depressed  Thought Process:  Coherent and Goal Directed  Orientation:  Full (Time, Place, and Person)  Thought Content:  Rumination  Suicidal Thoughts:  Yes.  with intent/plan  Homicidal Thoughts:  No  Memory:  Immediate;   Good Recent;   Good Remote;   Good  Judgement:  Fair  Insight:  Good  Psychomotor Activity:  Decreased  Concentration:  Good  Recall:  Good  Fund of Knowledge:Good  Language: Good  Akathisia:  Negative  Handed:  Right  AIMS (if indicated):     Assets:  Communication Skills Desire for Improvement Housing Leisure Time Resilience Transportation  ADL's:  Impaired  Cognition: WNL  Sleep:      Treatment Plan Summary: Daily contact with patient to assess and evaluate symptoms and progress in treatment, Medication management and Plan Patient meet criteria for acute psychiatric hospitalization and will refer to the behavioral health Hospital as patient requested.   Continue [phone] as patient endorses suicidal ideation with intention or plan Increase Zoloft 200 mg daily for depression  Start Trazodone 50 mg PO Qhs for insomnia Referred to the psychiatric social service for appropriate psychiatric placement.  Appreciate psychiatric consultation and follow up as clinically required Please contact [phone] or [phone] if needs further assistance  Disposition: Recommend  psychiatric Inpatient admission when medically cleared. Supportive therapy provided about ongoing stressors.  Recruitment consultant., MD 12/09/2015 11:18 AM

## 2015-12-09 NOTE — Clinical Social Work Note (Signed)
CSW received a message from MD that pt is ready for discharge and wants psychiatry to consult today. Per chart pt was seen by psychiatry on 3/27 and inpatient was recommended.  CSW called and confirmed with [facility] that pt has a psych consult and is on the list to be seen by psychiatry today.  Per [facility] pt is on consult list to be seen today.  MD and CSW will follow up after current psychiatry recommendations.   Elray Buba, LCSW [facility] Clinical Social Worker - Weekend Coverage cell #: [phone]

## 2015-12-09 NOTE — Progress Notes (Signed)
PHARMACIST - PHYSICIAN COMMUNICATION CONCERNING:  Methotrexate  Patient takes Methotrexate 7.5 mg on Thursdays and Fridays.  Methotrexate will be place on hold today (Thursday's dose) due to P&T approved hold criteria listed below.  Patient's Hgb is 7.9.  If patient's Hgb remains <8 tomorrow, tomorrow's dose will be held as well.  Methotrexate (Trexall; Rheumatrex) hold criteria  Hgb < 8  WBC < 3  Pltc < 100K  SCr > 1.5x baseline (or > 2 if baseline unknown)  AST or ALT >3x ULN  Bili > 1.5x ULN  Ascites or pleural effusion  Diarrhea - Grade 2 or higher  Ulcerative stomatitis  Unexplained pneumonitis / hypoxemia  Clance Boll, PharmD, BCPS Pager: [phone] 12/09/2015 8:03 AM

## 2015-12-09 NOTE — Progress Notes (Signed)
Pt stated that she has had 5 episodes of diarrhea in the last 24 hours and an upset stomach. Notified NP Craige Cotta. Pt ordered to be on Enteric precautions and stool sample requested. Will continue to monitor.

## 2015-12-10 DIAGNOSIS — R197 Diarrhea, unspecified: Secondary | ICD-10-CM

## 2015-12-10 LAB — C DIFFICILE QUICK SCREEN W PCR REFLEX
C DIFFICILE (CDIFF) INTERP: NEGATIVE
C DIFFICILE (CDIFF) TOXIN: NEGATIVE
C DIFFICLE (CDIFF) ANTIGEN: NEGATIVE

## 2015-12-10 NOTE — Progress Notes (Addendum)
Patient was reccommended psychiatric inpatient treatment on 3/31, when medically clear, per Dr. Elsie Saas.  Patient has been referred to the following facilities: [facility] - per [facility], one geriatric female bed tonight. [facility] - per [facility], accepting referrals tonight[facility] - per [facility], will look at it. [facility] - per [employer], couple of beds open. [facility], will review it. [facility] and [facility] are both accepting referrals for waitlist.  Left voicemail for [facility] inquiring about bed status. [facility] - no answer.  At capacity: [facility] [place], [place] Disposition staff 12/10/2015 8:27 PM

## 2015-12-10 NOTE — Progress Notes (Signed)
PT Cancellation Note  Patient Details Name: Tara Brown MRN: [id number] DOB: [birth year]   Cancelled Treatment:    Reason Eval/Treat Not Completed: Other (comment)the  Patient just ambulated to the [facility] w/ sitter and reports that she is doing well with PFRW.llt check back at another time.   Rada Hay 12/10/2015, 11:17 AM Blanchard Kelch PT [phone]

## 2015-12-10 NOTE — Progress Notes (Signed)
Occupational Therapy Treatment Patient Details Name: Shellee Streng MRN: [id number] DOB: Dec 18, 1960 Today's Date: 12/10/2015    History of present illness 55 yo female admitted through ED with depression.  Pt with hx of RA with significant limitations noted in UEs.  Pt s/p L great toe amputation 2* osteomyelitis 12/07/15   OT comments  Tried AE for dressing and combing hair to increase independence and preserve joint function.  Pt liked both options presented  Follow Up Recommendations   [facility])    Equipment Recommendations  None recommended by OT    Recommendations for Other Services      Precautions / Restrictions Precautions Precautions: Fall Precaution Comments: Limted use of UEs 2* RA changes Required Braces or Orthoses: Other Brace/Splint Other Brace/Splint: post op shoe on L Restrictions LLE Weight Bearing: Touchdown weight bearing       Mobility Bed Mobility Overal bed mobility: Modified Independent                Transfers                      Balance                                   ADL       Grooming: Brushing hair;Set up;With adaptive equipment               Lower Body Dressing: Minimal assistance;Sit to/from stand (pants with dressing stick (dropped once requring assistance))                 General ADL Comments: Brought dressing stick for pt to try for LB dressing as she has a reacher, but it is difficulty for her to manipulate.  Also brought resources for angled long handled comb as pt is unable to comb her hair.  Created a makeshift long handled bendable comb with foam, velcro and comb.  Gave to RN to hold as pt is on suicide precautions.  Pt did not need to use restroom either time that I was in there.        Vision                     Perception     Praxis      Cognition   Behavior During Therapy: WFL for tasks assessed/performed Overall Cognitive Status: Within Functional Limits for  tasks assessed                       Extremity/Trunk Assessment               Exercises     Shoulder Instructions       General Comments      Pertinent Vitals/ Pain       Pain Score: 2  Pain Location: L foot  Pain Descriptors / Indicators: Sore Pain Intervention(s): Limited activity within patient's tolerance;Monitored during session  Home Living                                          Prior Functioning/Environment              Frequency Min 2X/week     Progress Toward Goals  OT Goals(current goals can now be found in the care plan section)  Progress towards OT goals: Progressing toward goals     Plan      Co-evaluation                 End of Session     Activity Tolerance Patient tolerated treatment well   Patient Left in bed;with call bell/phone within reach;with nursing/sitter in room   Nurse Communication          Time: 1026-1040 and 1355 - 1400 OT Time Calculation (min): 19 min  Charges: OT General Charges $OT Visit: 1 Procedure OT Treatments $Self Care/Home Management : 8-22 mins  Tyaisha Cullom 12/10/2015, 2:15 PM  Marica Otter, OTR/L [phone] 12/10/2015

## 2015-12-10 NOTE — Progress Notes (Signed)
PHARMACIST - PHYSICIAN COMMUNICATION CONCERNING:  Methotrexate  Patient takes Methotrexate 7.5 mg on Thursdays and Fridays.  Methotrexate will be place on hold today (Thursday's dose) due to P[employer]T approved hold criteria listed below.  Patient's Hgb is 7.9 on 3/30. Dose held yesterday, no new labs this morning, so will continue to hold today's dose as well.   Methotrexate (Trexall; Rheumatrex) hold criteria  Hgb < 8  WBC < 3  Pltc < 100K  SCr > 1.5x baseline (or > 2 if baseline unknown)  AST or ALT >3x ULN  Bili > 1.5x ULN  Ascites or pleural effusion  Diarrhea - Grade 2 or higher  Ulcerative stomatitis  Unexplained pneumonitis / hypoxemia  Juliette Alcide, PharmD, BCPS.   Pager: [phone] 12/10/2015 8:35 AM

## 2015-12-10 NOTE — Progress Notes (Signed)
PROGRESS NOTE    Tara Brown  GXQ:[id number]  DOB: 12/11/1960  DOA: 12/05/2015 PCP: Vonna Kotyk Outpatient Specialists: Rheumatology: Dr. Stacey Drain Podiatry: Dr. Toni Arthurs course: 55 year old female with a PMH of rheumatoid/psoriatic arthritis, anemia, osteoporosis, depression, lives alone, mostly independent of activities of daily living but at times uses a cane for ambulation, presented to [facility] ED for evaluation of fatigue. She apparently has been noncompliant with her medications and has been under a lot of stress physically from her rheumatoid arthritis and financial stress. She is overwhelmed and contemplating suicide by drug OD but did not attempt. In the ED, noted to have open draining wound of her left big toe with hardware protruding out. X-ray showed loosening of the great toe first MTP joint prosthesis. Admitted for further management. Suicide precautions. Psychiatry and orthopedics consulted. S/P amputation of left great toe and first metatarsal on 12/07/15. DC to inpatient psychiatry when bed available.   Assessment & Plan:   Cellulitis complicating chronic left big toe wound and osteomyelitis. - Patient states that she has had the wound for almost 9 months which intermittently drains and has been treated with several courses of antibiotics and her podiatrist plans to perform some surgical intervention. - X-ray shows loosening of the great toe first MTP joint prosthesis, further subluxation of the first metatarsal head prosthetic, component and underlying bone resorption. - Continue empirically started IV vancomycin and Zosyn. - Orthopedics/Dr. Lajoyce Corners consulted and patient underwent amputation of left great toe and first metatarsal on 3/28.  - Wound culture MSSA. - Discussed with Dr. Lajoyce Corners 3/30: Recommended discontinuing antibiotics, keep current dressing until outpatient follow-up with him in 1 week, weightbearing as tolerated  and cleared from him for discharge.  Suicidal ideation/major depressive disorder, recurrent, severe without psychotic features. - Suicide precautions & 1:1 Recruitment consultant. - Psychiatry was consulted and discussed with psychiatry 3/27. They recommend acute psychiatric hospitalization once medically stable. - Continue Zoloft 150 MG daily. - Patient apparently reported no suicidal ideations on 3/29 and suicide precautions had been discontinued. However on speaking with her today, patient clearly indicates that she continues to have suicidal ideations without definite plans. Reinstated suicidal precautions and 1:1 sitter.  - Psychiatry reassessed patient on 3/30 due to ongoing suicidal ideations. Zoloft was increased to 200 MG daily for depression and started trazodone 50 MG at bedtime for insomnia and continue to recommend inpatient psychiatry admission. - Patient is medically stable for discharge to inpatient psychiatry when bed is available.  Advanced rheumatoid and psoriatic arthritis - Continue methotrexate & prednisone. Patient states that she is also on Cimzia 400 mg SQ every 2 weeks which she has not taken since end of 2016. Outpatient follow-up with Dr. Kellie Simmering.  Essential hypertension - Controlled. Continue lisinopril.  Microcytic anemia/iron & B12 deficiency anemia - Iron 11, TIBC 193, saturation ratio 6, ferritin 44, folate 28.8 and B12: 150 - Started iron and B12 supplements.  Diarrhea.  - Patient reported 4-5 episodes of diarrhea on 3/30. IV antibiotics and Colace were discontinued. - Diarrhea seems to be decreasing today. Low index of suspicion for C. difficile. However if diarrhea persists or worsens, consider C. difficile testing. Monitor    DVT prophylaxis: Lovenox Code Status: Full Family Communication: None at bedside Disposition Plan: Patient is medically stable for discharge to inpatient psychiatry when bed is available.   Consultants:  Psychiatry  Orthopedics/Dr.  Lajoyce Corners  Procedures:  Amputation of left great toe and first metatarsal on 12/07/15  Antimicrobials:  IV Zosyn 3/26 > 3/30  IV vancomycin 3/26 > 3/30  Subjective: Diarrhea, 4-5 times yesterday but reduced today. No abdominal pain reported. No other complaints.  Objective: Filed Vitals:   12/09/15 2335 12/10/15 0547 12/10/15 0938 12/10/15 1500  BP: 107/77 133/62 120/53 114/53  Pulse: 65 60 74 78  Temp: 97.9 F (36.6 C) 97.7 F (36.5 C)  98.1 F (36.7 C)  TempSrc: Oral Oral  Oral  Resp: 18 18 16 16   Height:      Weight:      SpO2: 97% 97%  96%    Intake/Output Summary (Last 24 hours) at 12/10/15 1845 Last data filed at 12/10/15 0858  Gross per 24 hour  Intake    720 ml  Output      0 ml  Net    720 ml   Filed Weights   12/05/15 1555  Weight: 50.349 kg (111 lb)    Exam:  General exam: Small built and frail middle-aged female lying comfortably supine in bed. Suicide sitter at bedside. Respiratory system: Clear. No increased work of breathing. Cardiovascular system: S1 & S2 heard, RRR. No JVD, murmurs, gallops, clicks or pedal edema. Gastrointestinal system: Abdomen is nondistended, soft and nontender. Normal bowel sounds heard. Central nervous system: Alert and oriented. No focal neurological deficits. Extremities: Symmetric 5 x 5 power. Left foot dressing clean and dry.     Data Reviewed: Basic Metabolic Panel:  Recent Labs Lab 12/05/15 1730 12/06/15 0417  NA 139 142  K 3.5 3.7  CL 109 110  CO2 23 25  GLUCOSE 79 96  BUN 17 18  CREATININE 0.77 0.84  CALCIUM 8.8* 8.4*  MG  --  2.1  PHOS  --  4.6   Liver Function Tests:  Recent Labs Lab 12/06/15 0417  AST 13*  ALT 8*  ALKPHOS 70  BILITOT 0.4  PROT 6.7  ALBUMIN 2.9*   No results for input(s): LIPASE, AMYLASE in the last 168 hours. No results for input(s): AMMONIA in the last 168 hours. CBC:  Recent Labs Lab 12/05/15 1730 12/06/15 0417 12/07/15 0418 12/08/15 0403 12/09/15 0402  WBC  7.1 5.5 6.7 7.6 6.0  NEUTROABS 4.3 3.2 4.4 5.1 3.2  HGB 9.6* 8.3* 8.4* 7.7* 7.9*  HCT 31.1* 27.0* 27.6* 24.9* 25.8*  MCV 75.3* 76.3* 76.7* 76.4* 76.1*  PLT 345 265 309 307 291   Cardiac Enzymes: No results for input(s): CKTOTAL, CKMB, CKMBINDEX, TROPONINI in the last 168 hours. BNP (last 3 results) No results for input(s): PROBNP in the last 8760 hours. CBG: No results for input(s): GLUCAP in the last 168 hours.  Recent Results (from the past 240 hour(s))  Wound culture     Status: None   Collection Time: 12/05/15  5:30 PM  Result Value Ref Range Status   Specimen Description FOOT LEFT  Final   Special Requests Immunocompromised  Final   Gram Stain   Final    RARE WBC NO SQUAMOUS EPITHELIAL CELLS SEEN FEW GRAM POSITIVE COCCI IN PAIRS Performed at 12/07/15    Culture   Final    ABUNDANT STAPHYLOCOCCUS AUREUS Note: RIFAMPIN AND GENTAMICIN SHOULD NOT BE USED AS SINGLE DRUGS FOR TREATMENT OF STAPH INFECTIONS. Performed at [facility]    Report Status 12/08/2015 FINAL  Final   Organism ID, Bacteria STAPHYLOCOCCUS AUREUS  Final      Susceptibility   Staphylococcus aureus - MIC*    CLINDAMYCIN <=0.25 SENSITIVE Sensitive  ERYTHROMYCIN <=0.25 SENSITIVE Sensitive     GENTAMICIN <=0.5 SENSITIVE Sensitive     LEVOFLOXACIN <=0.12 SENSITIVE Sensitive     OXACILLIN 0.5 SENSITIVE Sensitive     RIFAMPIN <=0.5 SENSITIVE Sensitive     TRIMETH/SULFA <=10 SENSITIVE Sensitive     VANCOMYCIN 1 SENSITIVE Sensitive     TETRACYCLINE >=16 RESISTANT Resistant     MOXIFLOXACIN <=0.25 SENSITIVE Sensitive     * ABUNDANT STAPHYLOCOCCUS AUREUS  Surgical pcr screen     Status: None   Collection Time: 12/07/15 10:24 AM  Result Value Ref Range Status   MRSA, PCR NEGATIVE NEGATIVE Final   Staphylococcus aureus NEGATIVE NEGATIVE Final    Comment:        The Xpert SA Assay (FDA approved for NASAL specimens in patients over 45 years of age), is one component of a comprehensive  surveillance program.  Test performance has been validated by [facility] for patients greater than or equal to 39 year old. It is not intended to diagnose infection nor to guide or monitor treatment.          Studies: No results found.      Scheduled Meds: . calcium-vitamin D  1 tablet Oral BID WC  . chlorhexidine  60 mL Topical Once  . enoxaparin (LOVENOX) injection  40 mg Subcutaneous Q24H  . Ferrous Fumarate  1 tablet Oral Daily  . folic acid  1 mg Oral Daily  . lisinopril  40 mg Oral Daily  . meloxicam  15 mg Oral Daily  . methotrexate  7.5 mg Oral Once per day on Thu Fri  . pantoprazole  40 mg Oral Daily  . povidone-iodine  2 application Topical Once  . predniSONE  10 mg Oral Q breakfast  . sertraline  200 mg Oral Daily  . sodium chloride flush  3 mL Intravenous Q12H  . traZODone  50 mg Oral QHS  . vitamin B-12  1,000 mcg Oral Daily   Continuous Infusions: . sodium chloride 75 mL/hr at 12/08/15 0628  . sodium chloride      Principal Problem:   Major depressive disorder, recurrent, severe without psychotic features (HCC) Active Problems:   Anemia of chronic disease   Essential hypertension   Chronic osteomyelitis (HCC)   Cellulitis of toe, left   Left foot infection   Suicidal ideations   Rheumatoid arthritis (HCC)    Time spent: 20 minutes.    Marcellus Scott, MD, FACP, FHM. [facility] Pager [phone]  If 7PM-7AM, please contact night-coverage [email or web address] Password [email or web address] 12/10/2015, 6:45 PM    LOS: 5 days

## 2015-12-10 NOTE — Clinical Social Work Note (Signed)
CSW called [facility] to see if there are beds available.  [facility] stated they were in the process of reviewing today's discharges and would call CSW back regarding availability to admit.  CSW will continue to seek placement.  [place], [place] [phone]

## 2015-12-11 LAB — CBC
HEMATOCRIT: 25.4 % — AB (ref 36.0–46.0)
HEMOGLOBIN: 7.9 g/dL — AB (ref 12.0–15.0)
MCH: 23.6 pg — AB (ref 26.0–34.0)
MCHC: 31.1 g/dL (ref 30.0–36.0)
MCV: 75.8 fL — ABNORMAL LOW (ref 78.0–100.0)
Platelets: 328 10*3/uL (ref 150–400)
RBC: 3.35 MIL/uL — ABNORMAL LOW (ref 3.87–5.11)
RDW: 20.3 % — ABNORMAL HIGH (ref 11.5–15.5)
WBC: 5.9 10*3/uL (ref 4.0–10.5)

## 2015-12-11 LAB — BASIC METABOLIC PANEL
Anion gap: 8 (ref 5–15)
BUN: 17 mg/dL (ref 6–20)
CHLORIDE: 108 mmol/L (ref 101–111)
CO2: 29 mmol/L (ref 22–32)
CREATININE: 0.91 mg/dL (ref 0.44–1.00)
Calcium: 8.5 mg/dL — ABNORMAL LOW (ref 8.9–10.3)
GFR calc non Af Amer: >60 mL/min (ref >60)
Glucose, Bld: 103 mg/dL — ABNORMAL HIGH (ref 65–99)
Potassium: 4.2 mmol/L (ref 3.5–5.1)
Sodium: 145 mmol/L (ref 135–145)

## 2015-12-11 LAB — GASTROINTESTINAL PANEL BY PCR, STOOL (REPLACES STOOL CULTURE)
Adenovirus F40/41: NOT DETECTED
Astrovirus: NOT DETECTED
CRYPTOSPORIDIUM: NOT DETECTED
CYCLOSPORA CAYETANENSIS: NOT DETECTED
Campylobacter species: NOT DETECTED
E. COLI O157: NOT DETECTED
ENTAMOEBA HISTOLYTICA: NOT DETECTED
ENTEROTOXIGENIC E COLI (ETEC): NOT DETECTED
Enteroaggregative E coli (EAEC): NOT DETECTED
Enteropathogenic E coli (EPEC): NOT DETECTED
Giardia lamblia: NOT DETECTED
Norovirus GI/GII: NOT DETECTED
Plesimonas shigelloides: NOT DETECTED
ROTAVIRUS A: NOT DETECTED
SALMONELLA SPECIES: NOT DETECTED
SAPOVIRUS (I, II, IV, AND V): NOT DETECTED
SHIGA LIKE TOXIN PRODUCING E COLI (STEC): NOT DETECTED
SHIGELLA/ENTEROINVASIVE E COLI (EIEC): NOT DETECTED
VIBRIO CHOLERAE: NOT DETECTED
VIBRIO SPECIES: NOT DETECTED
YERSINIA ENTEROCOLITICA: NOT DETECTED

## 2015-12-11 MED ORDER — ALUM & MAG HYDROXIDE-SIMETH 200-200-20 MG/5ML PO SUSP
30.0000 mL | Freq: Four times a day (QID) | ORAL | Status: DC | PRN
  Administered 2015-12-11: 30 mL via ORAL
  Filled 2015-12-11: qty 30

## 2015-12-11 MED ORDER — DOCUSATE SODIUM 100 MG PO CAPS: 100.0000 mg | ORAL_CAPSULE | Freq: Two times a day (BID) | ORAL | Status: DC

## 2015-12-11 MED ORDER — TRAZODONE HCL 50 MG PO TABS: 50.0000 mg | ORAL_TABLET | Freq: Every day | ORAL | Status: DC

## 2015-12-11 MED ORDER — SERTRALINE HCL 100 MG PO TABS: 200.0000 mg | ORAL_TABLET | Freq: Every day | ORAL | Status: DC

## 2015-12-11 MED ORDER — OXYCODONE HCL 5 MG PO TABS
5.0000 mg | ORAL_TABLET | ORAL | Status: DC | PRN
  Administered 2015-12-11 – 2015-12-12 (×6): 5 mg via ORAL
  Filled 2015-12-11 (×6): qty 1

## 2015-12-11 NOTE — Progress Notes (Signed)
Patient ID: Tara Brown, female   DOB: [birth year], 55 y.o.   MRN: [id number] No acute changes.  Right foot dressing intact with only a small amount of dry blood.  Mobilizing well.  Has post-op shoe.

## 2015-12-11 NOTE — Progress Notes (Signed)
Physical Therapy Treatment Patient Details Name: Tara Brown MRN: [id number] DOB: February 15, 1961 Today's Date: 12/11/2015    History of Present Illness 55 yo female admitted through ED with depression.  Pt with hx of RA with significant limitations noted in UEs.  Pt s/p L great toe amputation 2* osteomyelitis 12/07/15    PT Comments    Pt progressing with mobility demonstrating increased awareness of limiting WB on L foot  Follow Up Recommendations  Other (comment)     Equipment Recommendations       Recommendations for Other Services OT consult     Precautions / Restrictions Precautions Precautions: Fall Precaution Comments: Limted use of UEs 2* RA changes Restrictions Weight Bearing Restrictions: No LLE Weight Bearing: Touchdown weight bearing    Mobility  Bed Mobility Overal bed mobility: Modified Independent                Transfers Overall transfer level: Needs assistance Equipment used: None Transfers: Sit to/from Stand Sit to Stand: Supervision         General transfer comment: cues to limit WB on L LE  Ambulation/Gait Ambulation/Gait assistance: Min guard;Supervision Ambulation Distance (Feet): 64 Feet Assistive device: Rolling walker (2 wheeled);Bilateral platform walker Gait Pattern/deviations: Step-to pattern;Decreased step length - right;Decreased step length - left;Shuffle;Trunk flexed     General Gait Details: Min cues for technique with pt demonstrating marked improvement in sequencing and awareness of WB status on L foot with any wt placed on heel of foot.   Stairs            Wheelchair Mobility    Modified Rankin (Stroke Patients Only)       Balance                                    Cognition Arousal/Alertness: Awake/alert Behavior During Therapy: WFL for tasks assessed/performed Overall Cognitive Status: Within Functional Limits for tasks assessed                      Exercises       General Comments        Pertinent Vitals/Pain Pain Assessment: Faces Faces Pain Scale: Hurts little more Pain Location: L foot and arms Pain Descriptors / Indicators: Aching;Sore Pain Intervention(s): Limited activity within patient's tolerance;Monitored during session;Premedicated before session    Home Living                      Prior Function            PT Goals (current goals can now be found in the care plan section) Acute Rehab PT Goals Patient Stated Goal: be as independent as possible PT Goal Formulation: With patient Time For Goal Achievement: 12/17/15 Potential to Achieve Goals: Good Progress towards PT goals: Progressing toward goals    Frequency  Min 3X/week    PT Plan Current plan remains appropriate    Co-evaluation             End of Session Equipment Utilized During Treatment: Gait belt Activity Tolerance: Patient tolerated treatment well Patient left: in chair;with call bell/phone within reach;with family/visitor present     Time: 4098-1191 PT Time Calculation (min) (ACUTE ONLY): 10 min  Charges:  $Gait Training: 8-22 mins                    G Codes:  Tara Brown 12/11/2015, 3:28 PM

## 2015-12-11 NOTE — Progress Notes (Signed)
Just received a call from [name] at [facility].  After reviewing criteria for admission and the patient's comorbidities, Tara Brown is declining acceptance of this patient.

## 2015-12-11 NOTE — Discharge Summary (Addendum)
Tara Brown, is a 55 y.o. female  DOB 01-16-1961  MRN [id number].  Admission date:  12/05/2015  Admitting Physician  Bobette Mo, MD  Discharge Date:  12/11/2015   Primary MD  Vonna Kotyk  Recommendations for primary care physician for things to follow:   Repeat CBC and BMP in a week.   Admission Diagnosis  Foot infection [L08.9]   Discharge Diagnosis  Foot infection [L08.9]     Principal Problem:   Major depressive disorder, recurrent, severe without psychotic features (HCC) Active Problems:   Anemia of chronic disease   Essential hypertension   Chronic osteomyelitis (HCC)   Cellulitis of toe, left   Left foot infection   Suicidal ideations   Rheumatoid arthritis (HCC)      Past Medical History  Diagnosis Date  . Major depressive disorder, recurrent, severe without psychotic features (HCC) 03/11/2015    Past Surgical History  Procedure Laterality Date  . Hip fracture surgery    . Knee surgery    . Foot surgery    . Hand surgery    . Neck surgery    . Amputation Left 12/07/2015    Procedure: AMPUTATION LEFT GREAT TOE;  Surgeon: Nadara Mustard, MD;  Location: [facility];  Service: Orthopedics;  Laterality: Left;       HPI  from the history and physical done on the day of admission:    56 year old female with a PMH of rheumatoid/psoriatic arthritis, anemia, osteoporosis, depression, lives alone, mostly independent of activities of daily living but at times uses a cane for ambulation, presented to [facility] [place] [facility] for evaluation of fatigue. She apparently has been noncompliant with her medications and has been under a lot of stress physically from her rheumatoid arthritis and financial stress. She is overwhelmed and contemplating suicide by drug OD but did not attempt. In  the ED, noted to have open draining wound of her left big toe with hardware protruding out. X-ray showed loosening of the great toe first MTP joint prosthesis. Admitted for further management. Suicide precautions. Psychiatry and orthopedics consulted. S/P amputation of left great toe and first metatarsal on 12/07/15. DC to inpatient psychiatry when bed available.    Hospital Course:    Cellulitis complicating chronic left big toe wound and osteomyelitis. - She has had a draining wound for on-and-off 9 months, initially treated with IV vancomycin and Zosyn, she was seen by orthopedic surgeon Dr. Lajoyce Corners and she underwent amputation of left great toe and first metatarsal on 3/28. Previous M.D. discussed her case with Dr. Lajoyce Corners 3/30: Recommended discontinuing antibiotics, keep current dressing until outpatient follow-up with him in 1 week, weightbearing as tolerated and cleared from him for discharge. Follow with Dr. Lajoyce Corners within a week foot for stressing change. Touch toe weightbearing on the left foot at this time.  Suicidal ideation/major depressive disorder, recurrent, severe without psychotic features. - Suicide precautions & 1:1 Recruitment consultant. - Psychiatry was consulted and to be placed at [facility]. Zoloft dose has been adjusted  by psychiatry and trazodone has been added.   Advanced rheumatoid and psoriatic arthritis - Continue methotrexate & prednisone. Patient states that she is also on Cimzia 400 mg SQ every 2 weeks which she has not taken since end of 2016. Outpatient follow-up with Dr. Kellie Simmering.  Essential hypertension - Controlled. Continue lisinopril.  Microcytic anemia/iron & B12 deficiency anemia - Iron 11, TIBC 193, saturation ratio 6, ferritin 44, folate 28.8 and B12: 150 - Started iron and B12 supplements.  Diarrhea.  - Bowel regimen and stool softeners were discontinued, diarrhea must better. 2 bowel movements yesterday.    Discharge Condition: Fair  Follow UP  Follow-up  Information    Follow up with DUDA,[name], MD In 1 week.   Specialty:  Orthopedic Surgery   Contact information:   [address] [place] [address] [phone]       Follow up with WATTERSON,[name], [name]. Schedule an appointment as soon as possible for a visit in 1 week.   Specialty:  Internal Medicine   Contact information:   [address] [place] [address] [phone]        Consults obtained - Ortho, Psych  Diet and Activity recommendation: See Discharge Instructions below  Discharge Instructions           Discharge Instructions    Diet - low sodium heart healthy    Complete by:  As directed      Discharge instructions    Complete by:  As directed   Follow with Primary MD [name], [name] in 7 days   Get CBC, CMP, 2 view Chest X ray checked  by Primary MD next visit.    Activity: Touch Toe weightbearing left foot as tolerated with Full fall precautions use walker/cane & assistance as needed   Disposition [facility]   Diet:   Heart Healthy    For Heart failure patients - Check your Weight same time everyday, if you gain over 2 pounds, or you develop in leg swelling, experience more shortness of breath or chest pain, call your Primary MD immediately. Follow Cardiac Low Salt Diet and 1.5 lit/day fluid restriction.   On your next visit with your primary care physician please Get Medicines reviewed and adjusted.   Please request your Prim.MD to go over all Hospital Tests and Procedure/Radiological results at the follow up, please get all Hospital records sent to your Prim MD by signing hospital release before you go home.   If you experience worsening of your admission symptoms, develop shortness of breath, life threatening emergency, suicidal or homicidal thoughts you must seek medical attention immediately by calling 911 or calling your MD immediately  if symptoms less severe.  You Must read complete instructions/literature along with all  the possible adverse reactions/side effects for all the Medicines you take and that have been prescribed to you. Take any new Medicines after you have completely understood and accpet all the possible adverse reactions/side effects.   Do not drive, operating heavy machinery, perform activities at heights, swimming or participation in water activities or provide baby sitting services if your were admitted for syncope or siezures until you have seen by Primary MD or a Neurologist and advised to do so again.  Do not drive when taking Pain medications.    Do not take more than prescribed Pain, Sleep and Anxiety Medications  Special Instructions: If you have smoked or chewed Tobacco  in the last 2 yrs please stop smoking, stop any regular Alcohol  and or  any Recreational drug use.  Wear Seat belts while driving.   Please note  You were cared for by a hospitalist during your hospital stay. If you have any questions about your discharge medications or the care you received while you were in the hospital after you are discharged, you can call the unit and asked to speak with the hospitalist on call if the hospitalist that took care of you is not available. Once you are discharged, your primary care physician will handle any further medical issues. Please note that NO REFILLS for any discharge medications will be authorized once you are discharged, as it is imperative that you return to your primary care physician (or establish a relationship with a primary care physician if you do not have one) for your aftercare needs so that they can reassess your need for medications and monitor your lab values.     Touch down weight bearing    Complete by:  As directed   Laterality:  left  Extremity:  Lower             Discharge Medications       Medication List    STOP taking these medications        zolpidem 10 MG tablet  Commonly known as:  AMBIEN      TAKE these medications         calcium-vitamin D 500-200 MG-UNIT tablet  Commonly known as:  OSCAL WITH D  Take 1 tablet by mouth 2 (two) times daily.     fluocinonide cream 0.05 %  Commonly known as:  LIDEX  Apply 1 application topically 2 (two) times daily as needed (for psorasis).     folic acid 1 MG tablet  Commonly known as:  FOLVITE  Take 1 mg by mouth daily.     lisinopril 40 MG tablet  Commonly known as:  PRINIVIL,ZESTRIL  Take 40 mg by mouth daily.     meloxicam 15 MG tablet  Commonly known as:  MOBIC  Take 15 mg by mouth daily.     methotrexate 2.5 MG tablet  Commonly known as:  RHEUMATREX  Take 7.5 mg by mouth 2 (two) times a week. Thursday and Friday     predniSONE 10 MG tablet  Commonly known as:  DELTASONE  Take 10 mg by mouth daily.     sertraline 100 MG tablet  Commonly known as:  ZOLOFT  Take 2 tablets (200 mg total) by mouth daily.     traMADol 50 MG tablet  Commonly known as:  ULTRAM  Take 50 mg by mouth every 6 (six) hours as needed. For pain.     traZODone 50 MG tablet  Commonly known as:  DESYREL  Take 1 tablet (50 mg total) by mouth at bedtime.        Major procedures and Radiology Reports - PLEASE review detailed and final reports for all details, in brief -      Dg Foot Complete Left  12/05/2015  CLINICAL DATA:  Left great toe redness and swelling. Hardware protruding medially. EXAM: LEFT FOOT - COMPLETE 3+ VIEW COMPARISON:  09/01/2015 FINDINGS: The prosthetic component at the distal first metatarsal has subluxed for error in the dorsal/medial direction. The underlying bone shows significant resorption. Little bone contacts the hardware. There is also lucency surrounding the prosthetic component at the base of the great toe proximal phalanx. This has mildly increased from the prior study. There is resorption of the heads of the second through fifth  metatarsals, most prominently the second. Second metatarsophalangeal joint is dislocated with proximal phalanx projecting dorsal  to the distal second metatarsal. There are resorption zone the proximal phalanges of the fourth and fifth toes. There is partial resorption of the middle phalanx of the fourth toe. Bones are diffusely demineralized. Soft tissues are unremarkable. There is no acute fracture. IMPRESSION: 1. Loosening of the great toe first MTP joint prosthesis. There has been further subluxation of the first metatarsal head prosthetic component. There is more underlying bone resorption than there was on the prior study. 2. Other changes of forefoot erosions are stable from the prior exam. No acute fracture. Electronically Signed   By: Amie Portland M.D.   On: 12/05/2015 18:30    Micro Results      Recent Results (from the past 240 hour(s))  Wound culture     Status: None   Collection Time: 12/05/15  5:30 PM  Result Value Ref Range Status   Specimen Description FOOT LEFT  Final   Special Requests Immunocompromised  Final   Gram Stain   Final    RARE WBC NO SQUAMOUS EPITHELIAL CELLS SEEN FEW GRAM POSITIVE COCCI IN PAIRS Performed at [facility]    Culture   Final    ABUNDANT STAPHYLOCOCCUS AUREUS Note: RIFAMPIN AND GENTAMICIN SHOULD NOT BE USED AS SINGLE DRUGS FOR TREATMENT OF STAPH INFECTIONS. Performed at [facility]    Report Status 12/08/2015 FINAL  Final   Organism ID, Bacteria STAPHYLOCOCCUS AUREUS  Final      Susceptibility   Staphylococcus aureus - MIC*    CLINDAMYCIN <=0.25 SENSITIVE Sensitive     ERYTHROMYCIN <=0.25 SENSITIVE Sensitive     GENTAMICIN <=0.5 SENSITIVE Sensitive     LEVOFLOXACIN <=0.12 SENSITIVE Sensitive     OXACILLIN 0.5 SENSITIVE Sensitive     RIFAMPIN <=0.5 SENSITIVE Sensitive     TRIMETH/SULFA <=10 SENSITIVE Sensitive     VANCOMYCIN 1 SENSITIVE Sensitive     TETRACYCLINE >=16 RESISTANT Resistant     MOXIFLOXACIN <=0.25 SENSITIVE Sensitive     * ABUNDANT STAPHYLOCOCCUS AUREUS  Surgical pcr screen     Status: None   Collection Time: 12/07/15 10:24 AM    Result Value Ref Range Status   MRSA, PCR NEGATIVE NEGATIVE Final   Staphylococcus aureus NEGATIVE NEGATIVE Final    Comment:        The Xpert SA Assay (FDA approved for NASAL specimens in patients over 59 years of age), is one component of a comprehensive surveillance program.  Test performance has been validated by [facility] for patients greater than or equal to 86 year old. It is not intended to diagnose infection nor to guide or monitor treatment.   C difficile quick scan w PCR reflex     Status: None   Collection Time: 12/10/15  8:50 PM  Result Value Ref Range Status   C Diff antigen NEGATIVE NEGATIVE Final   C Diff toxin NEGATIVE NEGATIVE Final   C Diff interpretation Negative for toxigenic C. difficile  Final       Today   Subjective    [facility] today has no headache,no chest abdominal pain,no new weakness tingling or numbness, feels much better wants to go home today.    Objective   Blood pressure 133/65, pulse 77, temperature 98.4 F (36.9 C), temperature source Oral, resp. rate 18, height 5\' 6"  (1.676 m), weight 50.349 kg (111 lb), SpO2 98 %.   Intake/Output Summary (Last 24 hours) at 12/11/15  1208 Last data filed at 12/11/15 7209  Gross per 24 hour  Intake   1080 ml  Output      0 ml  Net   1080 ml    Exam Awake Alert, Oriented x 3, No new F.N deficits, Normal affect [place].AT,PERRAL Supple Neck,No JVD, No cervical lymphadenopathy appriciated.  Symmetrical Chest wall movement, Good air movement bilaterally, CTAB RRR,No Gallops,Rubs or new Murmurs, No Parasternal Heave +ve B.Sounds, Abd Soft, Non tender, No organomegaly appriciated, No rebound -guarding or rigidity. No Cyanosis, Clubbing or edema, No new Rash or bruise, L foot in bandage   Data Review   CBC w Diff:  Lab Results  Component Value Date   WBC 5.9 12/11/2015   HGB 7.9* 12/11/2015   HCT 25.4* 12/11/2015   PLT 328 12/11/2015   LYMPHOPCT 37 12/09/2015   MONOPCT 8 12/09/2015    EOSPCT 3 12/09/2015   BASOPCT 0 12/09/2015    CMP:  Lab Results  Component Value Date   NA 145 12/11/2015   K 4.2 12/11/2015   CL 108 12/11/2015   CO2 29 12/11/2015   BUN 17 12/11/2015   CREATININE 0.91 12/11/2015   PROT 6.7 12/06/2015   ALBUMIN 2.9* 12/06/2015   BILITOT 0.4 12/06/2015   ALKPHOS 70 12/06/2015   AST 13* 12/06/2015   ALT 8* 12/06/2015  .   Total Time in preparing paper work, data evaluation and todays exam - 35 minutes  Leroy Sea M.D on 12/11/2015 at 12:08 PM  [facility]   Office  [phone]

## 2015-12-11 NOTE — BH Assessment (Signed)
[facility]) called to inquire about pts infection. Writer provided caller with pt room number and transferred to [facility] so that caller could speak with pt RN.

## 2015-12-12 MED ORDER — TRAMADOL HCL 50 MG PO TABS
100.0000 mg | ORAL_TABLET | Freq: Three times a day (TID) | ORAL | Status: DC
  Administered 2015-12-12 – 2015-12-14 (×8): 100 mg via ORAL
  Filled 2015-12-12 (×8): qty 2

## 2015-12-12 MED ORDER — TRAMADOL HCL 50 MG PO TABS: 50.0000 mg | ORAL_TABLET | Freq: Three times a day (TID) | ORAL | Status: DC

## 2015-12-12 MED ORDER — TRAMADOL HCL 50 MG PO TABS: 100.0000 mg | ORAL_TABLET | Freq: Three times a day (TID) | ORAL | Status: DC

## 2015-12-12 NOTE — Progress Notes (Signed)
Patient ID: Tara Brown, female   DOB: 03-08-1961, 55 y.o.   MRN: [id number] Dressing is clean and dry plan for discharge tomorrow. Patient may get the incision wet at time of discharge.

## 2015-12-12 NOTE — Progress Notes (Signed)
Chart reviewed. Patient admits to continued suicidal ideation. Cooperative and pleasant. Awaiting transfer to inpatient psychiatric unit.  Crista Curb, MD [facility] [email or web address] password [facility]

## 2015-12-13 ENCOUNTER — Other Ambulatory Visit (HOSPITAL_COMMUNITY): Payer: Self-pay | Admitting: Family

## 2015-12-13 DIAGNOSIS — M86672 Other chronic osteomyelitis, left ankle and foot: Principal | ICD-10-CM

## 2015-12-13 MED ORDER — TRAMADOL HCL 50 MG PO TABS: 100.0000 mg | ORAL_TABLET | Freq: Three times a day (TID) | ORAL | Status: DC

## 2015-12-13 NOTE — Discharge Summary (Signed)
Tara Brown, is a 55 y.o. female  DOB 1961/03/28  MRN [id number].  Admission date:  12/05/2015  Admitting Physician  Bobette Mo, MD  Discharge Date:  12/13/2015   Primary MD  Vonna Kotyk  Recommendations for primary care physician for things to follow:   Repeat CBC and BMP in a week.   Admission Diagnosis  Foot infection [L08.9]   Discharge Diagnosis  Foot infection [L08.9]     Principal Problem:   Major depressive disorder, recurrent, severe without psychotic features (HCC) Active Problems:   Anemia of chronic disease   Essential hypertension   Chronic osteomyelitis (HCC)   Cellulitis of toe, left   Left foot infection   Suicidal ideations   Rheumatoid arthritis (HCC)      Past Medical History  Diagnosis Date  . Major depressive disorder, recurrent, severe without psychotic features (HCC) 03/11/2015    Past Surgical History  Procedure Laterality Date  . Hip fracture surgery    . Knee surgery    . Foot surgery    . Hand surgery    . Neck surgery    . Amputation Left 12/07/2015    Procedure: AMPUTATION LEFT GREAT TOE;  Surgeon: Nadara Mustard, MD;  Location: [facility];  Service: Orthopedics;  Laterality: Left;       HPI  from the history and physical done on the day of admission:    55 year old female with a PMH of rheumatoid/psoriatic arthritis, anemia, osteoporosis, depression, lives alone, mostly independent of activities of daily living but at times uses a cane for ambulation, presented to the [facility] [place] ED for evaluation of fatigue. She apparently has been noncompliant with her medications and has been under a lot of stress physically from her rheumatoid arthritis and financial stress. She is overwhelmed and contemplating suicide by drug OD but did not attempt. In  the ED, noted to have open draining wound of her left big toe with hardware protruding out. X-ray showed loosening of the great toe first MTP joint prosthesis. Admitted for further management. Suicide precautions. Psychiatry and orthopedics consulted. S/P amputation of left great toe and first metatarsal on 12/07/15. DC to inpatient psychiatry when bed available.    Hospital Course:    Cellulitis complicating chronic left big toe wound and osteomyelitis. - She has had a draining wound for on-and-off 9 months, initially treated with IV vancomycin and Zosyn, she was seen by orthopedic surgeon Dr. Lajoyce Corners and she underwent amputation of left great toe and first metatarsal on 3/28. Previous M.D. discussed her case with Dr. Lajoyce Corners 3/30: Recommended discontinuing antibiotics, keep current dressing until outpatient follow-up with him in 1 week, weightbearing as tolerated and cleared from him for discharge. Follow with Dr. Lajoyce Corners within a week foot for dressing change. Touch toe weightbearing on the left foot at this time.  Suicidal ideation/major depressive disorder, recurrent, severe without psychotic features. - Suicide precautions & 1:1 Recruitment consultant. - Psychiatry was consulted and to be placed at [facility]. Zoloft dose has been adjusted  by psychiatry and trazodone has been added.   Advanced rheumatoid and psoriatic arthritis - Continue methotrexate & prednisone. Patient states that she is also on Cimzia 400 mg SQ every 2 weeks which she has not taken since end of 2016. Outpatient follow-up with Dr. Kellie Simmering.  Essential hypertension - Controlled. Continue lisinopril.  Microcytic anemia/iron & B12 deficiency anemia - Iron 11, TIBC 193, saturation ratio 6, ferritin 44, folate 28.8 and B12: 150 - Started iron and B12 supplements.  Diarrhea.  - Bowel regimen and stool softeners were discontinued    Discharge Condition: stable  Follow UP  Follow-up Information    Follow up with DUDA,[name], MD In 1  week.   Specialty:  Orthopedic Surgery   Contact information:   [address] [place] [address] [phone]       Follow up with WATTERSON,[name], [name]. Schedule an appointment as soon as possible for a visit in 1 week.   Specialty:  Internal Medicine   Contact information:   [address] [place] [address] [phone]        Consults obtained - Ortho, Psych  Diet and Activity recommendation: See Discharge Instructions below  Discharge Instructions           Discharge Instructions    Change dressing    Complete by:  As directed   May shower and get incision wet applied new dry dressing after cleansing.     Diet - low sodium heart healthy    Complete by:  As directed      Discharge instructions    Complete by:  As directed   Follow with Primary MD [name], [name] in 7 days   Get CBC, CMP, 2 view Chest X ray checked  by Primary MD next visit.    Activity: Touch Toe weightbearing left foot as tolerated with Full fall precautions use walker/cane & assistance as needed   Disposition [facility]   Diet:   Heart Healthy    For Heart failure patients - Check your Weight same time everyday, if you gain over 2 pounds, or you develop in leg swelling, experience more shortness of breath or chest pain, call your Primary MD immediately. Follow Cardiac Low Salt Diet and 1.5 lit/day fluid restriction.   On your next visit with your primary care physician please Get Medicines reviewed and adjusted.   Please request your Prim.MD to go over all Hospital Tests and Procedure/Radiological results at the follow up, please get all Hospital records sent to your Prim MD by signing hospital release before you go home.   If you experience worsening of your admission symptoms, develop shortness of breath, life threatening emergency, suicidal or homicidal thoughts you must seek medical attention immediately by calling 911 or calling your MD immediately  if symptoms less  severe.  You Must read complete instructions/literature along with all the possible adverse reactions/side effects for all the Medicines you take and that have been prescribed to you. Take any new Medicines after you have completely understood and accpet all the possible adverse reactions/side effects.   Do not drive, operating heavy machinery, perform activities at heights, swimming or participation in water activities or provide baby sitting services if your were admitted for syncope or siezures until you have seen by Primary MD or a Neurologist and advised to do so again.  Do not drive when taking Pain medications.    Do not take more than prescribed Pain, Sleep and Anxiety Medications  Special Instructions: If you  have smoked or chewed Tobacco  in the last 2 yrs please stop smoking, stop any regular Alcohol  and or any Recreational drug use.  Wear Seat belts while driving.   Please note  You were cared for by a hospitalist during your hospital stay. If you have any questions about your discharge medications or the care you received while you were in the hospital after you are discharged, you can call the unit and asked to speak with the hospitalist on call if the hospitalist that took care of you is not available. Once you are discharged, your primary care physician will handle any further medical issues. Please note that NO REFILLS for any discharge medications will be authorized once you are discharged, as it is imperative that you return to your primary care physician (or establish a relationship with a primary care physician if you do not have one) for your aftercare needs so that they can reassess your need for medications and monitor your lab values.     Touch down weight bearing    Complete by:  As directed   Laterality:  left  Extremity:  Lower             Discharge Medications       Medication List    STOP taking these medications        zolpidem 10 MG tablet    Commonly known as:  AMBIEN      TAKE these medications        calcium-vitamin D 500-200 MG-UNIT tablet  Commonly known as:  OSCAL WITH D  Take 1 tablet by mouth 2 (two) times daily.     fluocinonide cream 0.05 %  Commonly known as:  LIDEX  Apply 1 application topically 2 (two) times daily as needed (for psorasis).     folic acid 1 MG tablet  Commonly known as:  FOLVITE  Take 1 mg by mouth daily.     lisinopril 40 MG tablet  Commonly known as:  PRINIVIL,ZESTRIL  Take 40 mg by mouth daily.     meloxicam 15 MG tablet  Commonly known as:  MOBIC  Take 15 mg by mouth daily.     methotrexate 2.5 MG tablet  Commonly known as:  RHEUMATREX  Take 7.5 mg by mouth 2 (two) times a week. Thursday and Friday     predniSONE 10 MG tablet  Commonly known as:  DELTASONE  Take 10 mg by mouth daily.     sertraline 100 MG tablet  Commonly known as:  ZOLOFT  Take 2 tablets (200 mg total) by mouth daily.     traMADol 50 MG tablet  Commonly known as:  ULTRAM  Take 2 tablets (100 mg total) by mouth 4 (four) times daily -  with meals and at bedtime.     traZODone 50 MG tablet  Commonly known as:  DESYREL  Take 1 tablet (50 mg total) by mouth at bedtime.        Major procedures and Radiology Reports - PLEASE review detailed and final reports for all details, in brief -      Dg Foot Complete Left  12/05/2015  CLINICAL DATA:  Left great toe redness and swelling. Hardware protruding medially. EXAM: LEFT FOOT - COMPLETE 3+ VIEW COMPARISON:  09/01/2015 FINDINGS: The prosthetic component at the distal first metatarsal has subluxed for error in the dorsal/medial direction. The underlying bone shows significant resorption. Little bone contacts the hardware. There is also lucency surrounding the prosthetic component  at the base of the great toe proximal phalanx. This has mildly increased from the prior study. There is resorption of the heads of the second through fifth metatarsals, most prominently  the second. Second metatarsophalangeal joint is dislocated with proximal phalanx projecting dorsal to the distal second metatarsal. There are resorption zone the proximal phalanges of the fourth and fifth toes. There is partial resorption of the middle phalanx of the fourth toe. Bones are diffusely demineralized. Soft tissues are unremarkable. There is no acute fracture. IMPRESSION: 1. Loosening of the great toe first MTP joint prosthesis. There has been further subluxation of the first metatarsal head prosthetic component. There is more underlying bone resorption than there was on the prior study. 2. Other changes of forefoot erosions are stable from the prior exam. No acute fracture. Electronically Signed   By: Amie Portland M.D.   On: 12/05/2015 18:30    Micro Results      Recent Results (from the past 240 hour(s))  Wound culture     Status: None   Collection Time: 12/05/15  5:30 PM  Result Value Ref Range Status   Specimen Description FOOT LEFT  Final   Special Requests Immunocompromised  Final   Gram Stain   Final    RARE WBC NO SQUAMOUS EPITHELIAL CELLS SEEN FEW GRAM POSITIVE COCCI IN PAIRS Performed at [facility]    Culture   Final    ABUNDANT STAPHYLOCOCCUS AUREUS Note: RIFAMPIN AND GENTAMICIN SHOULD NOT BE USED AS SINGLE DRUGS FOR TREATMENT OF STAPH INFECTIONS. Performed at [facility]    Report Status 12/08/2015 FINAL  Final   Organism ID, Bacteria STAPHYLOCOCCUS AUREUS  Final      Susceptibility   Staphylococcus aureus - MIC*    CLINDAMYCIN <=0.25 SENSITIVE Sensitive     ERYTHROMYCIN <=0.25 SENSITIVE Sensitive     GENTAMICIN <=0.5 SENSITIVE Sensitive     LEVOFLOXACIN <=0.12 SENSITIVE Sensitive     OXACILLIN 0.5 SENSITIVE Sensitive     RIFAMPIN <=0.5 SENSITIVE Sensitive     TRIMETH/SULFA <=10 SENSITIVE Sensitive     VANCOMYCIN 1 SENSITIVE Sensitive     TETRACYCLINE >=16 RESISTANT Resistant     MOXIFLOXACIN <=0.25 SENSITIVE Sensitive     * ABUNDANT  STAPHYLOCOCCUS AUREUS  Surgical pcr screen     Status: None   Collection Time: 12/07/15 10:24 AM  Result Value Ref Range Status   MRSA, PCR NEGATIVE NEGATIVE Final   Staphylococcus aureus NEGATIVE NEGATIVE Final    Comment:        The Xpert SA Assay (FDA approved for NASAL specimens in patients over 29 years of age), is one component of a comprehensive surveillance program.  Test performance has been validated by [facility] for patients greater than or equal to 37 year old. It is not intended to diagnose infection nor to guide or monitor treatment.   Gastrointestinal Panel by PCR , Stool     Status: None   Collection Time: 12/10/15  8:50 PM  Result Value Ref Range Status   Campylobacter species NOT DETECTED NOT DETECTED Final   Plesimonas shigelloides NOT DETECTED NOT DETECTED Final   Salmonella species NOT DETECTED NOT DETECTED Final   Yersinia enterocolitica NOT DETECTED NOT DETECTED Final   Vibrio species NOT DETECTED NOT DETECTED Final   Vibrio cholerae NOT DETECTED NOT DETECTED Final   Enteroaggregative E coli (EAEC) NOT DETECTED NOT DETECTED Final   Enteropathogenic E coli (EPEC) NOT DETECTED NOT DETECTED Final   Enterotoxigenic E coli (  ETEC) NOT DETECTED NOT DETECTED Final   Shiga like toxin producing E coli (STEC) NOT DETECTED NOT DETECTED Final   E. coli O157 NOT DETECTED NOT DETECTED Final   Shigella/Enteroinvasive E coli (EIEC) NOT DETECTED NOT DETECTED Final   Cryptosporidium NOT DETECTED NOT DETECTED Final   Cyclospora cayetanensis NOT DETECTED NOT DETECTED Final   Entamoeba histolytica NOT DETECTED NOT DETECTED Final   Giardia lamblia NOT DETECTED NOT DETECTED Final   Adenovirus F40/41 NOT DETECTED NOT DETECTED Final   Astrovirus NOT DETECTED NOT DETECTED Final   Norovirus GI/GII NOT DETECTED NOT DETECTED Final   Rotavirus A NOT DETECTED NOT DETECTED Final   Sapovirus (I, II, IV, and V) NOT DETECTED NOT DETECTED Final  C difficile quick scan w PCR reflex      Status: None   Collection Time: 12/10/15  8:50 PM  Result Value Ref Range Status   C Diff antigen NEGATIVE NEGATIVE Final   C Diff toxin NEGATIVE NEGATIVE Final   C Diff interpretation Negative for toxigenic C. difficile  Final       Today   Subjective    Tara Brown today has no headache,no chest abdominal pain,no new weakness tingling or numbness, feels much better wants to go home today.    Objective   Blood pressure 135/71, pulse 68, temperature 97.8 F (36.6 C), temperature source Oral, resp. rate 16, height 5\' 6"  (1.676 m), weight 50.349 kg (111 lb), SpO2 98 %.   Intake/Output Summary (Last 24 hours) at 12/13/15 0800 Last data filed at 12/12/15 2010  Gross per 24 hour  Intake   1200 ml  Output      0 ml  Net   1200 ml    Exam Awake Alert, Oriented x 3, No new F.N deficits, Normal affect    Data Review   CBC w Diff:  Lab Results  Component Value Date   WBC 5.9 12/11/2015   HGB 7.9* 12/11/2015   HCT 25.4* 12/11/2015   PLT 328 12/11/2015   LYMPHOPCT 37 12/09/2015   MONOPCT 8 12/09/2015   EOSPCT 3 12/09/2015   BASOPCT 0 12/09/2015    CMP:  Lab Results  Component Value Date   NA 145 12/11/2015   K 4.2 12/11/2015   CL 108 12/11/2015   CO2 29 12/11/2015   BUN 17 12/11/2015   CREATININE 0.91 12/11/2015   PROT 6.7 12/06/2015   ALBUMIN 2.9* 12/06/2015   BILITOT 0.4 12/06/2015   ALKPHOS 70 12/06/2015   AST 13* 12/06/2015   ALT 8* 12/06/2015  .   Total Time in preparing paper work, data evaluation and todays exam - 35 minutes  12/08/2015 M.D on 12/13/2015 at 8:00 AM  [facility]   Office  [phone]

## 2015-12-13 NOTE — Progress Notes (Signed)
Occupational Therapy Treatment Patient Details Name: Tara Brown MRN: [id number] DOB: 1961/07/14 Today's Date: 12/13/2015    History of present illness 55 yo female admitted through ED with depression.  Pt with hx of RA with significant limitations noted in UEs.  Pt s/p L great toe amputation 2* osteomyelitis 12/07/15   OT comments  Pt now WBAT with Darco shoe  Follow Up Recommendations   [facility])    Equipment Recommendations  None recommended by OT    Recommendations for Other Services      Precautions / Restrictions Restrictions LLE Weight Bearing: Weight bearing as tolerated Other Position/Activity Restrictions: MD changed WB status to WBAT! ( Dr Lajoyce Corners)       Mobility Bed Mobility Overal bed mobility: Modified Independent             General bed mobility comments: good safety cognition  Transfers Overall transfer level: Needs assistance Equipment used: None Transfers: Sit to/from Stand Sit to Stand: Supervision         General transfer comment: cues to limit WB on L LE        ADL Overall ADL's : Needs assistance/impaired Eating/Feeding: Set up;Sitting   Grooming: Set up;Sitting;Oral care;Wash/dry face                   Toilet Transfer: Minimal assistance Toilet Transfer Details (indicate cue type and reason): pt now [employer]- Clothing Manipulation and Hygiene: Min guard;Sit to/from stand         General ADL Comments: Autumn from MD office called back andpt can be WBAT in Darco shoe.  NP at Md office will update order.      Vision                            Cognition   Behavior During Therapy: WFL for tasks assessed/performed Overall Cognitive Status: Within Functional Limits for tasks assessed                                    Pertinent Vitals/ Pain       Pain Assessment: No/denies pain         Frequency Min 2X/week     Progress Toward Goals  OT Goals(current goals can now be found in the  care plan section)  Progress towards OT goals: Progressing toward goals     Plan         End of Session     Activity Tolerance Patient tolerated treatment well   Patient Left in bed;with call bell/phone within reach;with nursing/sitter in room   Nurse Communication  pt now WBAT        Time: 3846-6599 OT Time Calculation (min): 9 min  Charges: OT General Charges $OT Visit: 1 Procedure OT Treatments $Self Care/Home Management : 8-22 mins  Trinty Marken, [place] Kung 12/13/2015, 4:48 PM

## 2015-12-13 NOTE — Progress Notes (Signed)
Physical Therapy Treatment Patient Details Name: Meri Pelot MRN: [id number] DOB: 1961-06-15 Today's Date: 12/13/2015    History of Present Illness 55 yo female admitted through ED with depression.  Pt with hx of RA with significant limitations noted in UEs.  Pt s/p L great toe amputation 2* osteomyelitis 12/07/15    PT Comments    Assisted OOB to amb a limited distance in hallway with direction on TTWB and proper use B platform walker.  Pt c/o "walker is too heavy" and asked if she could use a cane.  Explained it would be difficult to maintain TTWB with just a cane esp with noted RA B hands and "bad shoulders".   Follow Up Recommendations   see PT eval     Equipment Recommendations  Rolling walker with 5" wheels (B platforms)    Recommendations for Other Services       Precautions / Restrictions Precautions Precautions: Fall Precaution Comments: Limted use of UEs 2* RA changes Restrictions Weight Bearing Restrictions: No LLE Weight Bearing: Touchdown weight bearing    Mobility  Bed Mobility Overal bed mobility: Modified Independent             General bed mobility comments: good safety cognition  Transfers Overall transfer level: Needs assistance Equipment used: None Transfers: Sit to/from Stand Sit to Stand: Supervision         General transfer comment: cues to limit WB on L LE  Ambulation/Gait Ambulation/Gait assistance: Supervision;Min guard Ambulation Distance (Feet): 42 Feet Assistive device: Rolling walker (2 wheeled);Bilateral platform walker Gait Pattern/deviations: Step-to pattern;Decreased stance time - left;Trunk flexed Gait velocity: 50% Cues to slow pace to maintain TDWB   General Gait Details: Min cues for technique with pt demonstrating marked improvement in sequencing and awareness of WB status on L foot with any wt placed on heel of foot.   Stairs            Wheelchair Mobility    Modified Rankin (Stroke Patients Only)        Balance                                    Cognition Arousal/Alertness: Awake/alert Behavior During Therapy: WFL for tasks assessed/performed Overall Cognitive Status: Within Functional Limits for tasks assessed                      Exercises      General Comments        Pertinent Vitals/Pain Pain Assessment: No/denies pain Pain Score: 3  Faces Pain Scale: Hurts a little bit Pain Location: L foot and arms Pain Intervention(s): Monitored during session    Home Living                      Prior Function            PT Goals (current goals can now be found in the care plan section) Progress towards PT goals: Progressing toward goals    Frequency  Min 3X/week    PT Plan Current plan remains appropriate    Co-evaluation             End of Session Equipment Utilized During Treatment: Gait belt Activity Tolerance: Patient tolerated treatment well Patient left: in bed;with family/visitor present (sitter)     Time: 7510-2585 PT Time Calculation (min) (ACUTE ONLY): 14 min  Charges:  $Gait Training: 8-22 mins  G Codes:      Rica Koyanagi  PTA [facility]  Acute  Rehab Pager      [phone]

## 2015-12-13 NOTE — Progress Notes (Signed)
Await inpt psych placement for d/c.  Summary done 4/1.  Will update for 4/3  Marlin Canary DO

## 2015-12-13 NOTE — Progress Notes (Signed)
Followed up on inpatient referrals.  Referred to: [facility]- per Arlyss Repress, unable to locate referral sent 3/31, send again [facility]- per Purvis Kilts, same as above  Declined: [facility]- per Sue Lush due to needing wound care [facility]- per Gala Romney, medical acuity [facility]- per Liborio Nixon, due to medical acuity, wound on foot  At capacity: [facility]- advised received part of referral 3/31 but was unable to be reviewed. Re-refer if there are beds tomorrow 4/4 [facility]- advised call back later after 3pm.  Left voicemails for Minda Meo.

## 2015-12-13 NOTE — Care Management Note (Signed)
Case Management Note  Patient Details  Name: Tara Brown MRN: [id number] Date of Birth: [birth year]  Subjective/Objective:     Major depressive disorder               Action/Plan: Discharge planning per CSW, awaiting IP psych placement  Expected Discharge Date:                  [facility]  In-House Referral:  Clinical Social Work  Discharge planning Services  CM Consult  Post Acute Care Choice:  NA Choice offered to:  NA  DME Arranged:  N/A DME Agency:  NA  HH Arranged:  NA HH Agency:  NA  Status of Service:  Completed, signed off  Medicare Important Message Given:  Yes Date Medicare IM Given:    Medicare IM give by:    Date Additional Medicare IM Given:    Additional Medicare Important Message give by:     If discussed at Long Length of Stay Meetings, dates discussed:    Additional Comments:  Alexis Goodell, RN 12/13/2015, 10:09 AM [phone]

## 2015-12-13 NOTE — Progress Notes (Signed)
Occupational Therapy Treatment Patient Details Name: Tara Brown MRN: [id number] DOB: 10-16-1960 Today's Date: 12/13/2015    History of present illness 55 yo female admitted through ED with depression.  Pt with hx of RA with significant limitations noted in UEs.  Pt s/p L great toe amputation 2* osteomyelitis 12/07/15   OT comments  OT called MD Lajoyce Corners) regarding WB status. Pt not able hold a walker or use platform walker due to shoulders.    Follow Up Recommendations   [facility])    Equipment Recommendations  None recommended by OT    Recommendations for Other Services      Precautions / Restrictions Precautions Precautions: Fall Precaution Comments: Limted use of UEs 2* RA changes Restrictions Weight Bearing Restrictions: No LLE Weight Bearing: Touchdown weight bearing       Mobility Bed Mobility Overal bed mobility: Modified Independent             General bed mobility comments: good safety cognition  Transfers Overall transfer level: Needs assistance Equipment used: None Transfers: Sit to/from Stand Sit to Stand: Supervision         General transfer comment: cues to limit WB on L LE        ADL Overall ADL's : Needs assistance/impaired Eating/Feeding: Set up;Sitting   Grooming: Set up;Sitting;Oral care;Wash/dry face                   Toilet Transfer: Immunologist Details (indicate cue type and reason): platform walker-           General ADL Comments: Pt admits she is not able to use platform walker as it is too heavy for her shoulders. OT attempted to call MD but office is closed for lunch. Will recall at 1230.  Pts current WB status is TDWB. She is not able to hold a walker or a cane in her left hand                Cognition   Behavior During Therapy: [facility] for tasks assessed/performed Overall Cognitive Status: Within Functional Limits for tasks assessed                               General Comments       Pertinent Vitals/ Pain       Pain Assessment: No/denies pain Pain Score: 3  Faces Pain Scale: Hurts a little bit Pain Location: L foot and arms Pain Intervention(s): Monitored during session         Frequency Min 2X/week               End of Session     Activity Tolerance Patient tolerated treatment well   Patient Left in bed;with call bell/phone within reach;with nursing/sitter in room   Nurse Communication          Time: 5883-2549 OT Time Calculation (min): 34 min  Charges: OT General Charges $OT Visit: 1 Procedure OT Treatments $Self Care/Home Management : 23-37 mins  Lashundra Shiveley, [place] Kung 12/13/2015, 2:17 PM

## 2015-12-14 MED ORDER — TRAMADOL HCL 50 MG PO TABS: 100.0000 mg | ORAL_TABLET | Freq: Three times a day (TID) | ORAL | Status: DC

## 2015-12-14 MED ORDER — SERTRALINE HCL 100 MG PO TABS: 200.0000 mg | ORAL_TABLET | Freq: Every day | ORAL | Status: DC

## 2015-12-14 MED ORDER — TRAZODONE HCL 50 MG PO TABS: 50.0000 mg | ORAL_TABLET | Freq: Every day | ORAL | Status: DC

## 2015-12-14 NOTE — Progress Notes (Signed)
Await inpt psych placement for d/c.  D/c summary updated for 4/3.  Asked for psych re-eval 4/4.  Patient stable  Marlin Canary DO

## 2015-12-14 NOTE — Progress Notes (Signed)
CM met with pt to confirm she does NOT need any home health services or DME.  Pt states she is doing well with ambulation and declines need for either [facility] services or Platform walker.  No other CM needs were communicated.

## 2015-12-14 NOTE — Progress Notes (Addendum)
CSW assisting with d/c planning. Psych has cleared pt for d/c. CSW has made an appointment for pt to see Deneen Harts PA at [facility] on 4/6 at 2:pm. Pt is in agreement with this plan.  Cori Razor LCSW [phone]

## 2015-12-14 NOTE — Discharge Summary (Signed)
Tara Brown, is a 55 y.o. female  DOB 10/06/1960  MRN [id number].  Admission date:  12/05/2015  Admitting Physician  Bobette Mo, MD  Discharge Date:  12/14/2015   Primary MD  Vonna Kotyk  Recommendations for primary care physician for things to follow:   Repeat CBC and BMP in a week.   Admission Diagnosis  Foot infection [L08.9]   Discharge Diagnosis  Foot infection [L08.9]     Principal Problem:   Major depressive disorder, recurrent, severe without psychotic features (HCC) Active Problems:   Anemia of chronic disease   Essential hypertension   Chronic osteomyelitis (HCC)   Cellulitis of toe, left   Left foot infection   Suicidal ideations   Rheumatoid arthritis (HCC)      Past Medical History  Diagnosis Date  . Major depressive disorder, recurrent, severe without psychotic features (HCC) 03/11/2015    Past Surgical History  Procedure Laterality Date  . Hip fracture surgery    . Knee surgery    . Foot surgery    . Hand surgery    . Neck surgery    . Amputation Left 12/07/2015    Procedure: AMPUTATION LEFT GREAT TOE;  Surgeon: Nadara Mustard, MD;  Location: [facility] ORS;  Service: Orthopedics;  Laterality: Left;       HPI  from the history and physical done on the day of admission:    54 year old female with a PMH of rheumatoid/psoriatic arthritis, anemia, osteoporosis, depression, lives alone, mostly independent of activities of daily living but at times uses a cane for ambulation, presented to [facility] [place] ED for evaluation of fatigue. She apparently has been noncompliant with her medications and has been under a lot of stress physically from her rheumatoid arthritis and financial stress. She is overwhelmed and contemplating suicide by drug OD but did not attempt. In  the ED, noted to have open draining wound of her left big toe with hardware protruding out. X-ray showed loosening of the great toe first MTP joint prosthesis. Admitted for further management. Suicide precautions. Psychiatry and orthopedics consulted. S/P amputation of left great toe and first metatarsal on 12/07/15. DC to inpatient psychiatry when bed available.    Hospital Course:    Cellulitis complicating chronic left big toe wound and osteomyelitis. - She has had a draining wound for on-and-off 9 months, initially treated with IV vancomycin and Zosyn, she was seen by orthopedic surgeon Dr. Lajoyce Corners and she underwent amputation of left great toe and first metatarsal on 3/28. Previous M.D. discussed her case with Dr. Lajoyce Corners 3/30: Recommended discontinuing antibiotics, keep current dressing until outpatient follow-up with him in 1 week, weightbearing as tolerated and cleared from him for discharge. Follow with Dr. Lajoyce Corners within a week foot for dressing change. Touch toe weightbearing on the left foot at this time.  Suicidal ideation/major depressive disorder, recurrent, severe without psychotic features. - Suicide precautions & 1:1 safety sitter- seen by psych on 4/4 and sitter d/c'd and patient stable to be d/c'd home -  zoloft dose has been adjusted by psychiatry and trazodone has been added.   Advanced rheumatoid and psoriatic arthritis - Continue methotrexate & prednisone. Patient states that she is also on Cimzia 400 mg SQ every 2 weeks which she has not taken since end of 2016. Outpatient follow-up with Dr. Kellie Simmering.  Essential hypertension - Controlled. Continue lisinopril.  Microcytic anemia/iron & B12 deficiency anemia - Iron 11, TIBC 193, saturation ratio 6, ferritin 44, folate 28.8 and B12: 150 - Started iron and B12 supplements.  Diarrhea.  - Bowel regimen and stool softeners were discontinued    Discharge Condition: stable  Follow UP  Follow-up Information    Follow up with  DUDA,[name], MD In 1 week.   Specialty:  Orthopedic Surgery   Contact information:   [address] [place] [address] [phone]       Follow up with WATTERSON,[name], [name]. Schedule an appointment as soon as possible for a visit in 1 week.   Specialty:  Internal Medicine   Contact information:   [address] [place] [address] [phone]        Consults obtained - Ortho, Psych  Diet and Activity recommendation: See Discharge Instructions below  Discharge Instructions           Discharge Instructions    Change dressing    Complete by:  As directed   May shower and get incision wet applied new dry dressing after cleansing.     Diet - low sodium heart healthy    Complete by:  As directed      Discharge instructions    Complete by:  As directed   Follow with Primary MD [name], [name] in 7 days   Get CBC, CMP, 2 view Chest X ray checked  by Primary MD next visit.       Diet:   Heart Healthy    For Heart failure patients - Check your Weight same time everyday, if you gain over 2 pounds, or you develop in leg swelling, experience more shortness of breath or chest pain, call your Primary MD immediately. Follow Cardiac Low Salt Diet and 1.5 lit/day fluid restriction.   On your next visit with your primary care physician please Get Medicines reviewed and adjusted.   Please request your Prim.MD to go over all Hospital Tests and Procedure/Radiological results at the follow up, please get all Hospital records sent to your Prim MD by signing hospital release before you go home.   If you experience worsening of your admission symptoms, develop shortness of breath, life threatening emergency, suicidal or homicidal thoughts you must seek medical attention immediately by calling 911 or calling your MD immediately  if symptoms less severe.  You Must read complete instructions/literature along with all the possible adverse reactions/side effects for  all the Medicines you take and that have been prescribed to you. Take any new Medicines after you have completely understood and accpet all the possible adverse reactions/side effects.   Do not drive, operating heavy machinery, perform activities at heights, swimming or participation in water activities or provide baby sitting services if your were admitted for syncope or siezures until you have seen by Primary MD or a Neurologist and advised to do so again.  Do not drive when taking Pain medications.    Do not take more than prescribed Pain, Sleep and Anxiety Medications  Special Instructions: If you have smoked or chewed Tobacco  in the last 2 yrs please stop smoking, stop any  regular Alcohol  and or any Recreational drug use.  Wear Seat belts while driving.   Please note  You were cared for by a hospitalist during your hospital stay. If you have any questions about your discharge medications or the care you received while you were in the hospital after you are discharged, you can call the unit and asked to speak with the hospitalist on call if the hospitalist that took care of you is not available. Once you are discharged, your primary care physician will handle any further medical issues. Please note that NO REFILLS for any discharge medications will be authorized once you are discharged, as it is imperative that you return to your primary care physician (or establish a relationship with a primary care physician if you do not have one) for your aftercare needs so that they can reassess your need for medications and monitor your lab values.             Discharge Medications       Medication List    STOP taking these medications        zolpidem 10 MG tablet  Commonly known as:  AMBIEN      TAKE these medications        calcium-vitamin D 500-200 MG-UNIT tablet  Commonly known as:  OSCAL WITH D  Take 1 tablet by mouth 2 (two) times daily.     fluocinonide cream 0.05 %   Commonly known as:  LIDEX  Apply 1 application topically 2 (two) times daily as needed (for psorasis).     folic acid 1 MG tablet  Commonly known as:  FOLVITE  Take 1 mg by mouth daily.     lisinopril 40 MG tablet  Commonly known as:  PRINIVIL,ZESTRIL  Take 40 mg by mouth daily.     meloxicam 15 MG tablet  Commonly known as:  MOBIC  Take 15 mg by mouth daily.     methotrexate 2.5 MG tablet  Commonly known as:  RHEUMATREX  Take 7.5 mg by mouth 2 (two) times a week. Thursday and Friday     predniSONE 10 MG tablet  Commonly known as:  DELTASONE  Take 10 mg by mouth daily.     sertraline 100 MG tablet  Commonly known as:  ZOLOFT  Take 2 tablets (200 mg total) by mouth daily.     traMADol 50 MG tablet  Commonly known as:  ULTRAM  Take 2 tablets (100 mg total) by mouth 4 (four) times daily -  with meals and at bedtime.     traZODone 50 MG tablet  Commonly known as:  DESYREL  Take 1 tablet (50 mg total) by mouth at bedtime.        Major procedures and Radiology Reports - PLEASE review detailed and final reports for all details, in brief -      Dg Foot Complete Left  12/05/2015  CLINICAL DATA:  Left great toe redness and swelling. Hardware protruding medially. EXAM: LEFT FOOT - COMPLETE 3+ VIEW COMPARISON:  09/01/2015 FINDINGS: The prosthetic component at the distal first metatarsal has subluxed for error in the dorsal/medial direction. The underlying bone shows significant resorption. Little bone contacts the hardware. There is also lucency surrounding the prosthetic component at the base of the great toe proximal phalanx. This has mildly increased from the prior study. There is resorption of the heads of the second through fifth metatarsals, most prominently the second. Second metatarsophalangeal joint is dislocated with proximal phalanx projecting  dorsal to the distal second metatarsal. There are resorption zone the proximal phalanges of the fourth and fifth toes. There is  partial resorption of the middle phalanx of the fourth toe. Bones are diffusely demineralized. Soft tissues are unremarkable. There is no acute fracture. IMPRESSION: 1. Loosening of the great toe first MTP joint prosthesis. There has been further subluxation of the first metatarsal head prosthetic component. There is more underlying bone resorption than there was on the prior study. 2. Other changes of forefoot erosions are stable from the prior exam. No acute fracture. Electronically Signed   By: Amie Portland M.D.   On: 12/05/2015 18:30    Micro Results      Recent Results (from the past 240 hour(s))  Wound culture     Status: None   Collection Time: 12/05/15  5:30 PM  Result Value Ref Range Status   Specimen Description FOOT LEFT  Final   Special Requests Immunocompromised  Final   Gram Stain   Final    RARE WBC NO SQUAMOUS EPITHELIAL CELLS SEEN FEW GRAM POSITIVE COCCI IN PAIRS Performed at [facility]    Culture   Final    ABUNDANT STAPHYLOCOCCUS AUREUS Note: RIFAMPIN AND GENTAMICIN SHOULD NOT BE USED AS SINGLE DRUGS FOR TREATMENT OF STAPH INFECTIONS. Performed at [facility]    Report Status 12/08/2015 FINAL  Final   Organism ID, Bacteria STAPHYLOCOCCUS AUREUS  Final      Susceptibility   Staphylococcus aureus - MIC*    CLINDAMYCIN <=0.25 SENSITIVE Sensitive     ERYTHROMYCIN <=0.25 SENSITIVE Sensitive     GENTAMICIN <=0.5 SENSITIVE Sensitive     LEVOFLOXACIN <=0.12 SENSITIVE Sensitive     OXACILLIN 0.5 SENSITIVE Sensitive     RIFAMPIN <=0.5 SENSITIVE Sensitive     TRIMETH/SULFA <=10 SENSITIVE Sensitive     VANCOMYCIN 1 SENSITIVE Sensitive     TETRACYCLINE >=16 RESISTANT Resistant     MOXIFLOXACIN <=0.25 SENSITIVE Sensitive     * ABUNDANT STAPHYLOCOCCUS AUREUS  Surgical pcr screen     Status: None   Collection Time: 12/07/15 10:24 AM  Result Value Ref Range Status   MRSA, PCR NEGATIVE NEGATIVE Final   Staphylococcus aureus NEGATIVE NEGATIVE Final     Comment:        The Xpert SA Assay (FDA approved for NASAL specimens in patients over 53 years of age), is one component of a comprehensive surveillance program.  Test performance has been validated by [facility] for patients greater than or equal to 30 year old. It is not intended to diagnose infection nor to guide or monitor treatment.   Gastrointestinal Panel by PCR , Stool     Status: None   Collection Time: 12/10/15  8:50 PM  Result Value Ref Range Status   Campylobacter species NOT DETECTED NOT DETECTED Final   Plesimonas shigelloides NOT DETECTED NOT DETECTED Final   Salmonella species NOT DETECTED NOT DETECTED Final   Yersinia enterocolitica NOT DETECTED NOT DETECTED Final   Vibrio species NOT DETECTED NOT DETECTED Final   Vibrio cholerae NOT DETECTED NOT DETECTED Final   Enteroaggregative E coli (EAEC) NOT DETECTED NOT DETECTED Final   Enteropathogenic E coli (EPEC) NOT DETECTED NOT DETECTED Final   Enterotoxigenic E coli (ETEC) NOT DETECTED NOT DETECTED Final   Shiga like toxin producing E coli (STEC) NOT DETECTED NOT DETECTED Final   E. coli O157 NOT DETECTED NOT DETECTED Final   Shigella/Enteroinvasive E coli (EIEC) NOT DETECTED NOT DETECTED Final  Cryptosporidium NOT DETECTED NOT DETECTED Final   Cyclospora cayetanensis NOT DETECTED NOT DETECTED Final   Entamoeba histolytica NOT DETECTED NOT DETECTED Final   Giardia lamblia NOT DETECTED NOT DETECTED Final   Adenovirus F40/41 NOT DETECTED NOT DETECTED Final   Astrovirus NOT DETECTED NOT DETECTED Final   Norovirus GI/GII NOT DETECTED NOT DETECTED Final   Rotavirus A NOT DETECTED NOT DETECTED Final   Sapovirus (I, II, IV, and V) NOT DETECTED NOT DETECTED Final  C difficile quick scan w PCR reflex     Status: None   Collection Time: 12/10/15  8:50 PM  Result Value Ref Range Status   C Diff antigen NEGATIVE NEGATIVE Final   C Diff toxin NEGATIVE NEGATIVE Final   C Diff interpretation Negative for toxigenic C.  difficile  Final       Today   Subjective    Tara Brown today has no headache,no chest abdominal pain,no new weakness tingling or numbness, feels much better wants to go home today.    Objective   Blood pressure 142/63, pulse 64, temperature 98.2 F (36.8 C), temperature source Oral, resp. rate 16, height 5\' 6"  (1.676 m), weight 50.349 kg (111 lb), SpO2 96 %.   Intake/Output Summary (Last 24 hours) at 12/14/15 1349 Last data filed at 12/14/15 0852  Gross per 24 hour  Intake   1560 ml  Output      0 ml  Net   1560 ml    Exam Awake Alert, Oriented x 3, No new F.N deficits, Normal affect    Data Review   CBC w Diff:  Lab Results  Component Value Date   WBC 5.9 12/11/2015   HGB 7.9* 12/11/2015   HCT 25.4* 12/11/2015   PLT 328 12/11/2015   LYMPHOPCT 37 12/09/2015   MONOPCT 8 12/09/2015   EOSPCT 3 12/09/2015   BASOPCT 0 12/09/2015    CMP:  Lab Results  Component Value Date   NA 145 12/11/2015   K 4.2 12/11/2015   CL 108 12/11/2015   CO2 29 12/11/2015   BUN 17 12/11/2015   CREATININE 0.91 12/11/2015   PROT 6.7 12/06/2015   ALBUMIN 2.9* 12/06/2015   BILITOT 0.4 12/06/2015   ALKPHOS 70 12/06/2015   AST 13* 12/06/2015   ALT 8* 12/06/2015  .   Total Time in preparing paper work, data evaluation and todays exam - 35 minutes  Joseph Art M.D on 12/14/2015 at 1:49 PM  [facility]   Office  [phone]

## 2015-12-14 NOTE — Consult Note (Signed)
[facility] Face-to-Face Psychiatry Consult Follow Up  Reason for Consult:  Depression and suicide ideation Referring Physician:  Dr. Waymon Amato Patient Identification: Tara Brown MRN:  [id number] Principal Diagnosis: Major depressive disorder, recurrent, severe without psychotic features [facility]) Diagnosis:   Patient Active Problem List   Diagnosis Date Noted  . Left foot infection [L08.9] 12/06/2015  . Suicidal ideations [R45.851] 12/06/2015  . Rheumatoid arthritis (HCC) [M06.9] 12/06/2015  . Foot infection [L08.9] 12/05/2015  . Severe episode of recurrent major depressive disorder, without psychotic features (HCC) [F33.2]   . MDD (major depressive disorder), recurrent episode, severe (HCC) [F33.2] 09/11/2015  . Cellulitis of toe, left [L03.032]   . Chronic osteomyelitis (HCC) [M86.60] 09/07/2015  . Anemia, iron deficiency [D50.9]   . Essential hypertension [I10]   . Major depressive disorder, recurrent, severe without psychotic features (HCC) [F33.2] 03/11/2015  . Anemia of chronic disease [D63.8] 03/08/2015    Total Time spent with patient: 30 minutes  Subjective:   Tara Brown is a 55 y.o. female patient admitted with depression.  HPI:  Tara Brown is a 55 y.o. Female seen, chart reviewed and case discussed with the hospitalist. Patient has been suffering with past medical history rheumatoid arthritis, major depressive disorder, hypertension and admitted to the [facility] for increased symptoms of fatigue, loss of interest, loss of appetite, disturbed sleep over 4 weeks. Patient also reported she has been thinking about ending her life by taking intentional overdose of medication. She also reported she has no medication for the last 2-4 weeks because she cannot afford to pay the copayment. Patient also has a difficult to to access her primary care physician at [facility] who has been prescribing her medication. Patient reportedly want to change the primary  care physician and has problem with fund Center this time. Patient's sister was in [place] and patient father also supportive to her and occasionally provided limited financial support. Patient started feeling isolated, withdrawn and ruminating about worthlessness and how to end her life because of overwhelmed with the disability of rheumatoid arthritis, and psoriatic arthritis. Review of medical records indicated the patient was admitted to [facility] times during the 2016 and patient is willing to be placed again as she like the service is the past. Patient is willing for voluntarily admission when bed is available. Past Psychiatric History: Major depressive disorder, recurrent and has multiple acute psychiatric hospitalization at behavioral [facility].  Interval history: Patient seen face-to-face for psychiatric consultation follow-up today and case discussed with the Dr. Benjamine Mola and LCSW. Patient has been doing well without significant behavioral or emotional problems. Patient is satisfied with the improved symptoms of depression and anxiety secondary to adjusted medication regimen. Patient denies current symptoms of depression, anxiety and also accepted surgery for toe. Patient has been communicating with her sister who is in [place] and planning to relocate close to her sister. Patient is also willing to follow up with outpatient medication management with primary care physician and concerned about pain copayment if referred to psychiatric services. Patient denied current suicidal/homicidal ideation, intention or plans and contract for safety.   Risk to Self: Is patient at risk for suicide?: Yes Risk to Others:   Prior Inpatient Therapy:   Prior Outpatient Therapy:    Past Medical History:  Past Medical History  Diagnosis Date  . Major depressive disorder, recurrent, severe without psychotic features (HCC) 03/11/2015    Past Surgical History  Procedure Laterality Date   . Hip fracture surgery    .  Knee surgery    . Foot surgery    . Hand surgery    . Neck surgery    . Amputation Left 12/07/2015    Procedure: AMPUTATION LEFT GREAT TOE;  Surgeon: Nadara Mustard, MD;  Location: [facility];  Service: Orthopedics;  Laterality: Left;   Family History:  Family History  Problem Relation Age of Onset  . Stroke Neg Hx    Family Psychiatric  History: Unknown Social History:  History  Alcohol Use  . Yes    Comment: Rarely     History  Drug Use No    Social History   Social History  . Marital Status: Divorced    Spouse Name: N/A  . Number of Children: N/A  . Years of Education: N/A   Social History Main Topics  . Smoking status: Never Smoker   . Smokeless tobacco: Never Used  . Alcohol Use: Yes     Comment: Rarely  . Drug Use: No  . Sexual Activity: No   Other Topics Concern  . None   Social History Narrative   Additional Social History:    Allergies:  No Known Allergies  Labs:  No results found for this or any previous visit (from the past 48 hour(s)).  Current Facility-Administered Medications  Medication Dose Route Frequency Provider Last Rate Last Dose  . acetaminophen (TYLENOL) tablet 650 mg  650 mg Oral Q6H PRN Nadara Mustard, MD   650 mg at 12/12/15 1026  . alum & mag hydroxide-simeth (MAALOX/MYLANTA) 200-200-20 MG/5ML suspension 30 mL  30 mL Oral Q6H PRN Leroy Sea, MD   30 mL at 12/11/15 2103  . calcium-vitamin D (OSCAL WITH D) 500-200 MG-UNIT per tablet 1 tablet  1 tablet Oral BID WC Bobette Mo, MD   1 tablet at 12/14/15 0800  . chlorhexidine (HIBICLENS) 4 % liquid 4 application  60 mL Topical Once Aldean Baker V, MD      . enoxaparin (LOVENOX) injection 40 mg  40 mg Subcutaneous Q24H Bobette Mo, MD   40 mg at 12/14/15 0909  . Ferrous Fumarate (HEMOCYTE - 106 mg FE) tablet 106 mg of iron  1 tablet Oral Daily Elease Etienne, MD   106 mg of iron at 12/14/15 0910  . folic acid (FOLVITE) tablet 1 mg  1 mg Oral  Daily Bobette Mo, MD   1 mg at 12/14/15 0910  . lisinopril (PRINIVIL,ZESTRIL) tablet 40 mg  40 mg Oral Daily Bobette Mo, MD   40 mg at 12/14/15 0910  . meloxicam (MOBIC) tablet 15 mg  15 mg Oral Daily Bobette Mo, MD   15 mg at 12/14/15 0910  . methotrexate (RHEUMATREX) tablet 7.5 mg  7.5 mg Oral Once per day on Thu Fri Bobette Mo, MD   Stopped at 12/09/15 0800  . ondansetron (ZOFRAN) injection 4 mg  4 mg Intravenous Q6H PRN Nadara Mustard, MD      . pantoprazole (PROTONIX) EC tablet 40 mg  40 mg Oral Daily Bobette Mo, MD   40 mg at 12/14/15 0910  . povidone-iodine 10 % swab 2 application  2 application Topical Once Aldean Baker V, MD      . predniSONE (DELTASONE) tablet 10 mg  10 mg Oral Q breakfast Bobette Mo, MD   10 mg at 12/14/15 0800  . sertraline (ZOLOFT) tablet 200 mg  200 mg Oral Daily Leata Mouse, MD   200 mg at 12/14/15 0910  .  sodium chloride (OCEAN) 0.65 % nasal spray 1 spray  1 spray Each Nare PRN Nadara Mustard, MD      . traMADol Janean Sark) tablet 100 mg  100 mg Oral TID WC & HS Christiane Ha, MD   100 mg at 12/14/15 0800  . traZODone (DESYREL) tablet 50 mg  50 mg Oral QHS Leata Mouse, MD   50 mg at 12/13/15 2119  . vitamin B-12 (CYANOCOBALAMIN) tablet 1,000 mcg  1,000 mcg Oral Daily Elease Etienne, MD   1,000 mcg at 12/14/15 0910    Musculoskeletal: Strength & Muscle Tone: decreased Gait & Station: unable to stand Patient leans: N/A  Psychiatric Specialty Exam: ROS:  Blood pressure 142/63, pulse 64, temperature 98.2 F (36.8 C), temperature source Oral, resp. rate 16, height 5\' 6"  (1.676 m), weight 50.349 kg (111 lb), SpO2 96 %.Body mass index is 17.92 kg/(m^2).  General Appearance: Casual  Eye Contact::  Good  Speech:  Clear and Coherent  Volume:  Normal  Mood:  Euthymic  Affect:  Appropriate and Congruent  Thought Process:  Coherent and Goal Directed  Orientation:  Full (Time, Place, and Person)   Thought Content:  WDL  Suicidal Thoughts:  No  Homicidal Thoughts:  No  Memory:  Immediate;   Good Recent;   Good Remote;   Good  Judgement:  Fair  Insight:  Good  Psychomotor Activity:  Normal  Concentration:  Good  Recall:  Good  Fund of Knowledge:Good  Language: Good  Akathisia:  Negative  Handed:  Right  AIMS (if indicated):     Assets:  Communication Skills Desire for Improvement Housing Leisure Time Resilience Transportation  ADL's:  Impaired  Cognition: WNL  Sleep:      Treatment Plan Summary: Patient has improved symptoms of depression with her current medication regimen and denies current suicidal or homicidal ideation, intention or plans. Patient contract for safety and feels ready to be discharged to home and follow-up with outpatient medication management.   Discontinue [id number] as patient denies suicidal ideation with intention or plan and contract for safety May provide 30 day supply of prescriptions as below at the time of discharge Continue Zoloft 200 mg daily for depression  Continue Trazodone 50 mg PO Qhs for insomnia   Appreciate psychiatric consultation and follow up as clinically required Please contact [phone] or [phone] if needs further assistance  Disposition: Patient will be referred to the outpatient medication management No evidence of imminent risk to self or others at present.   Patient does not meet criteria for psychiatric inpatient admission. Supportive therapy provided about ongoing stressors.  Recruitment consultant., MD 12/14/2015 10:47 AM

## 2015-12-14 NOTE — Care Management Important Message (Signed)
Important Message  Patient Details  Name: Rabecca Birge MRN: [id number] Date of Birth: 1961/09/08   Medicare Important Message Given:  Yes    Haskell Flirt 12/14/2015, 10:38 AMImportant Message  Patient Details  Name: Hildreth Orsak MRN: [id number] Date of Birth: 1961/02/03   Medicare Important Message Given:  Yes    Haskell Flirt 12/14/2015, 10:38 AM

## 2015-12-14 NOTE — Progress Notes (Signed)
Pt to d/c home. AVS reviewed and "My Chart" discussed with pt. Pt capable of verbalizing medications, dressing changes, signs and symptoms of infection, and follow-up appointments. Remains hemodynamically stable. No signs and symptoms of distress. Educated pt to return to ER in the case of SOB, dizziness, or chest pain.  

## 2015-12-14 NOTE — Discharge Instructions (Signed)
Follow with Primary MD [name], [name] in 7 days   Get CBC, CMP, 2 view Chest X ray checked  by Primary MD next visit.    Activity: WBAT in [place] shoe    Diet:   Heart Healthy    For Heart failure patients - Check your Weight same time everyday, if you gain over 2 pounds, or you develop in leg swelling, experience more shortness of breath or chest pain, call your Primary MD immediately. Follow Cardiac Low Salt Diet and 1.5 lit/day fluid restriction.   On your next visit with your primary care physician please Get Medicines reviewed and adjusted.   Please request your Prim.MD to go over all Hospital Tests and Procedure/Radiological results at the follow up, please get all Hospital records sent to your Prim MD by signing hospital release before you go home.   If you experience worsening of your admission symptoms, develop shortness of breath, life threatening emergency, suicidal or homicidal thoughts you must seek medical attention immediately by calling 911 or calling your MD immediately  if symptoms less severe.  You Must read complete instructions/literature along with all the possible adverse reactions/side effects for all the Medicines you take and that have been prescribed to you. Take any new Medicines after you have completely understood and accpet all the possible adverse reactions/side effects.   Do not drive, operating heavy machinery, perform activities at heights, swimming or participation in water activities or provide baby sitting services if your were admitted for syncope or siezures until you have seen by Primary MD or a Neurologist and advised to do so again.  Do not drive when taking Pain medications.    Do not take more than prescribed Pain, Sleep and Anxiety Medications  Special Instructions: If you have smoked or chewed Tobacco  in the last 2 yrs please stop smoking, stop any regular Alcohol  and or any Recreational drug use.  Wear Seat belts while  driving.   Please note  You were cared for by a hospitalist during your hospital stay. If you have any questions about your discharge medications or the care you received while you were in the hospital after you are discharged, you can call the unit and asked to speak with the hospitalist on call if the hospitalist that took care of you is not available. Once you are discharged, your primary care physician will handle any further medical issues. Please note that NO REFILLS for any discharge medications will be authorized once you are discharged, as it is imperative that you return to your primary care physician (or establish a relationship with a primary care physician if you do not have one) for your aftercare needs so that they can reassess your need for medications and monitor your lab values.

## 2016-04-23 ENCOUNTER — Encounter (HOSPITAL_COMMUNITY): Payer: Self-pay | Admitting: *Deleted

## 2016-04-23 ENCOUNTER — Emergency Department (HOSPITAL_COMMUNITY)
Admission: EM | Admit: 2016-04-23 | Discharge: 2016-04-24 | Disposition: A | Discharge status: Other Acute Inpatient Hospital | Payer: Medicare Other | Attending: Emergency Medicine | Admitting: Emergency Medicine

## 2016-04-23 DIAGNOSIS — F332 Major depressive disorder, recurrent severe without psychotic features: Secondary | ICD-10-CM | POA: Diagnosis present

## 2016-04-23 DIAGNOSIS — F329 Major depressive disorder, single episode, unspecified: Secondary | ICD-10-CM | POA: Insufficient documentation

## 2016-04-23 DIAGNOSIS — Z79899 Other long term (current) drug therapy: Secondary | ICD-10-CM | POA: Diagnosis not present

## 2016-04-23 DIAGNOSIS — R45851 Suicidal ideations: Secondary | ICD-10-CM | POA: Diagnosis not present

## 2016-04-23 DIAGNOSIS — T1491 Suicide attempt: Secondary | ICD-10-CM | POA: Diagnosis present

## 2016-04-23 DIAGNOSIS — I10 Essential (primary) hypertension: Secondary | ICD-10-CM | POA: Diagnosis not present

## 2016-04-23 DIAGNOSIS — T1491XA Suicide attempt, initial encounter: Secondary | ICD-10-CM

## 2016-04-23 LAB — COMPREHENSIVE METABOLIC PANEL
ALT: 6 U/L — ABNORMAL LOW (ref 14–54)
ANION GAP: 13 (ref 5–15)
AST: 18 U/L (ref 15–41)
Albumin: 3.5 g/dL (ref 3.5–5.0)
Alkaline Phosphatase: 79 U/L (ref 38–126)
BUN: 15 mg/dL (ref 6–20)
CHLORIDE: 99 mmol/L — AB (ref 101–111)
CO2: 22 mmol/L (ref 22–32)
Calcium: 9.3 mg/dL (ref 8.9–10.3)
Creatinine, Ser: 1.05 mg/dL — ABNORMAL HIGH (ref 0.44–1.00)
GFR, EST NON AFRICAN AMERICAN: 58 mL/min — AB (ref >60)
Glucose, Bld: 132 mg/dL — ABNORMAL HIGH (ref 65–99)
POTASSIUM: 3.8 mmol/L (ref 3.5–5.1)
Sodium: 134 mmol/L — ABNORMAL LOW (ref 135–145)
Total Bilirubin: 0.6 mg/dL (ref 0.3–1.2)
Total Protein: 9.2 g/dL — ABNORMAL HIGH (ref 6.5–8.1)

## 2016-04-23 LAB — CBC WITH DIFFERENTIAL/PLATELET
BASOS PCT: 0 %
Basophils Absolute: 0 10*3/uL (ref 0.0–0.1)
EOS PCT: 0 %
Eosinophils Absolute: 0 10*3/uL (ref 0.0–0.7)
HEMATOCRIT: 32.8 % — AB (ref 36.0–46.0)
HEMOGLOBIN: 10.1 g/dL — AB (ref 12.0–15.0)
LYMPHS PCT: 17 %
Lymphs Abs: 1.4 10*3/uL (ref 0.7–4.0)
MCH: 22.2 pg — AB (ref 26.0–34.0)
MCHC: 30.8 g/dL (ref 30.0–36.0)
MCV: 72.2 fL — AB (ref 78.0–100.0)
MONO ABS: 0.6 10*3/uL (ref 0.1–1.0)
MONOS PCT: 7 %
NEUTROS PCT: 76 %
Neutro Abs: 6.1 10*3/uL (ref 1.7–7.7)
Platelets: 402 10*3/uL — ABNORMAL HIGH (ref 150–400)
RBC: 4.54 MIL/uL (ref 3.87–5.11)
RDW: 20.8 % — ABNORMAL HIGH (ref 11.5–15.5)
WBC: 8.1 10*3/uL (ref 4.0–10.5)

## 2016-04-23 LAB — ACETAMINOPHEN LEVEL: Acetaminophen (Tylenol), Serum: <10 ug/mL — ABNORMAL LOW (ref 10–30)

## 2016-04-23 LAB — RAPID URINE DRUG SCREEN, HOSP PERFORMED
AMPHETAMINES: NOT DETECTED
Barbiturates: NOT DETECTED
Benzodiazepines: NOT DETECTED
COCAINE: NOT DETECTED
OPIATES: NOT DETECTED
Tetrahydrocannabinol: NOT DETECTED

## 2016-04-23 LAB — SALICYLATE LEVEL: Salicylate Lvl: <4 mg/dL (ref 2.8–30.0)

## 2016-04-23 LAB — ETHANOL: Alcohol, Ethyl (B): <5 mg/dL (ref <5)

## 2016-04-23 MED ORDER — LISINOPRIL 20 MG PO TABS
40.0000 mg | ORAL_TABLET | Freq: Every day | ORAL | Status: DC
  Administered 2016-04-23 – 2016-04-24 (×2): 40 mg via ORAL
  Filled 2016-04-23 (×2): qty 1

## 2016-04-23 MED ORDER — CALCIUM CARBONATE-VITAMIN D 500-200 MG-UNIT PO TABS
1.0000 | ORAL_TABLET | Freq: Two times a day (BID) | ORAL | Status: DC
  Administered 2016-04-23 – 2016-04-24 (×3): 1 via ORAL
  Filled 2016-04-23 (×3): qty 1

## 2016-04-23 MED ORDER — FOLIC ACID 1 MG PO TABS
1.0000 mg | ORAL_TABLET | Freq: Every day | ORAL | Status: DC
  Administered 2016-04-23 – 2016-04-24 (×2): 1 mg via ORAL
  Filled 2016-04-23 (×2): qty 1

## 2016-04-23 MED ORDER — TRAMADOL HCL 50 MG PO TABS
100.0000 mg | ORAL_TABLET | Freq: Three times a day (TID) | ORAL | Status: DC
  Administered 2016-04-23 – 2016-04-24 (×4): 100 mg via ORAL
  Filled 2016-04-23 (×4): qty 2

## 2016-04-23 MED ORDER — SERTRALINE HCL 50 MG PO TABS
200.0000 mg | ORAL_TABLET | Freq: Every day | ORAL | Status: DC
  Administered 2016-04-23 – 2016-04-24 (×2): 200 mg via ORAL
  Filled 2016-04-23 (×2): qty 4

## 2016-04-23 MED ORDER — PREDNISONE 20 MG PO TABS
10.0000 mg | ORAL_TABLET | Freq: Every day | ORAL | Status: DC
  Administered 2016-04-23 – 2016-04-24 (×2): 10 mg via ORAL
  Filled 2016-04-23 (×2): qty 1

## 2016-04-23 NOTE — ED Triage Notes (Signed)
Per EMS pt coming from home with c/o recent suicide attempt and depression. Per EMS pt sts took 300 mg of Ambien on Thursday night, in an attempt to commit suicide, she woke up last night. Per EMS today pt decided that she really doesn't want to kill herself but wants help with depression.

## 2016-04-23 NOTE — BH Assessment (Signed)
Tele Assessment Note   Tara Brown is a 55 y.o. female who presented to [facility] voluntarily after attempting to commit suicide by intentionally overdosing on 30 10 mg Ambien tabs.  Pt reported as follows:  Pt stated that she has significant and chronic arthritis pain that makes her feel helpless and despondent.  She stated that the pain was severe enough on 04/21/16 that she decided to kill herself by overdosing on the Ambien.  "I didn't want to be a burden to my family anymore."  Pt stated that she took the pills and was frustrated when she awoke on 04/22/16.  It was at that point that she decided she should come to the hospital.  In addition to her suicide attempt (her first), Pt endorsed persistent and unremitting sadness, poor appetite, difficulty sleeping without medicinal aid, tearfulness, feelings of worthlessness and hopelessness, isolation, and loss of pleasure.    Pt takes Zoloft, which is prescribed by her PCP.  She said that she does not have the funds to see a psychiatrist or engage in therapy.  During assessment, Pt was calm and cooperative.  She had good eye contact.  She was dressed in scrubs and appeared appropriately groomed.  Pt's mood was sad and affect was congruent.  She endorsed a suicide attempt with plan and intent as well as other depressive symptoms (see above).  She denied homicidal ideation, self-injury, auditory/visual hallucination, a history of abuse, and substance use.  Pt's speech was normal in rate, rhythm, and volume.  Pt's memory and concentration were intact.  Thought processes were within normal limits.  Thought content was goal-oriented.  Impulse control, insight, and judgment were deemed poor as evidenced by recent suicide attempt.  Consulted with L. Earlene Plater, NP, who determined that Pt meets inpatient criteria.     Diagnosis: Major Depressive Disorder, Recurrent, Severe, w/o psychotic symptoms  Past Medical History:  Past Medical History:  Diagnosis Date  .  Arthritis   . Major depressive disorder, recurrent, severe without psychotic features (HCC) 03/11/2015    Past Surgical History:  Procedure Laterality Date  . AMPUTATION Left 12/07/2015   Procedure: AMPUTATION LEFT GREAT TOE;  Surgeon: Nadara Mustard, MD;  Location: [facility];  Service: Orthopedics;  Laterality: Left;  . FOOT SURGERY    . HAND SURGERY    . HIP FRACTURE SURGERY    . KNEE SURGERY    . NECK SURGERY      Family History:  Family History  Problem Relation Age of Onset  . Stroke Neg Hx     Social History:  reports that she has never smoked. She has never used smokeless tobacco. She reports that she drinks alcohol. She reports that she does not use drugs.  Additional Social History:  Alcohol / Drug Use Pain Medications: See PTA Prescriptions: See PTA Over the Counter: See PTA History of alcohol / drug use?: No history of alcohol / drug abuse  CIWA: CIWA-Ar BP: 124/62 Pulse Rate: 78 COWS:    PATIENT STRENGTHS: (choose at least two) Average or above average intelligence Capable of independent living Communication skills  Allergies: No Known Allergies  Home Medications:  (Not in a hospital admission)  OB/GYN Status:  No LMP recorded. Patient is postmenopausal.  General Assessment Data Location of Assessment: [facility] TTS Assessment: In system Is this a Tele or Face-to-Face Assessment?: Tele Assessment Is this an Initial Assessment or a Re-assessment for this encounter?: Initial Assessment Marital status: Single Is patient pregnant?: No Pregnancy Status: No Living  Arrangements: Alone Can pt return to current living arrangement?: Yes Admission Status: Voluntary Is patient capable of signing voluntary admission?: Yes Referral Source: Self/Family/Friend Insurance type: [place] [employer] [facility]  Medical Screening Exam [facility] Walk-in ONLY) Medical Exam completed: Yes  Crisis Care Plan Living Arrangements: Alone Name of Psychiatrist: None currently Name of Therapist:  None currently  Education Status Is patient currently in school?: No  Risk to self with the past 6 months Suicidal Ideation: Yes-Currently Present Has patient been a risk to self within the past 6 months prior to admission? : No Suicidal Intent: Yes-Currently Present Has patient had any suicidal intent within the past 6 months prior to admission? : No Is patient at risk for suicide?: Yes Suicidal Plan?: Yes-Currently Present Has patient had any suicidal plan within the past 6 months prior to admission? : No Specify Current Suicidal Plan: Pt overdosed on 30 Ambien 10 mg tabs Access to Means: No What has been your use of drugs/alcohol within the last 12 months?: Denied Previous Attempts/Gestures: No Intentional Self Injurious Behavior: None Family Suicide History: Unknown Recent stressful life event(s): Recent negative physical changes (Increased arthritis pain) Persecutory voices/beliefs?: No Depression: Yes Depression Symptoms: Despondent, Tearfulness, Isolating, Fatigue, Loss of interest in usual pleasures, Guilt, Feeling worthless/self pity (Poor appetite) Substance abuse history and/or treatment for substance abuse?: No Suicide prevention information given to non-admitted patients: Not applicable  Risk to Others within the past 6 months Homicidal Ideation: No Does patient have any lifetime risk of violence toward others beyond the six months prior to admission? : No Thoughts of Harm to Others: No Current Homicidal Intent: No Current Homicidal Plan: No Access to Homicidal Means: No History of harm to others?: No Assessment of Violence: None Noted Does patient have access to weapons?: No Criminal Charges Pending?: No Does patient have a court date: No Is patient on probation?: No  Psychosis Hallucinations: None noted Delusions: None noted  Mental Status Report Appearance/Hygiene: In scrubs, Unremarkable Eye Contact: Good Motor Activity: Unremarkable, Freedom of  movement Speech: Unremarkable Level of Consciousness: Alert Mood: Sad Affect: Sad, Appropriate to circumstance Anxiety Level: None Thought Processes: Coherent, Relevant Judgement: Impaired Orientation: Person, Place, Time, Situation Obsessive Compulsive Thoughts/Behaviors: None  Cognitive Functioning Concentration: Normal Memory: Recent Intact, Remote Intact IQ: Average Insight: Poor Impulse Control: Poor Appetite: Poor Sleep: No Change  ADLScreening [facility]) Patient's cognitive ability adequate to safely complete daily activities?: Yes Patient able to express need for assistance with ADLs?: Yes Independently performs ADLs?: Yes (appropriate for developmental age)  Prior Inpatient Therapy Prior Inpatient Therapy: Yes Prior Therapy Dates: December 2016-Jan 2017 Prior Therapy Facilty/Provider(s): [facility] Reason for Treatment: Depressive symptoms  Prior Outpatient Therapy Prior Outpatient Therapy: No Does patient have an ACCT team?: No Does patient have Intensive In-House Services?  : No Does patient have [facility] services? : No Does patient have P4CC services?: No  ADL Screening (condition at time of admission) Patient's cognitive ability adequate to safely complete daily activities?: Yes Is the patient deaf or have difficulty hearing?: No Does the patient have difficulty seeing, even when wearing glasses/contacts?: No Does the patient have difficulty concentrating, remembering, or making decisions?: No Patient able to express need for assistance with ADLs?: Yes Does the patient have difficulty dressing or bathing?: No Independently performs ADLs?: Yes (appropriate for developmental age) Does the patient have difficulty walking or climbing stairs?: No Weakness of Legs: None Weakness of Arms/Hands: Both (Pt reported difficulty washing hair due to arthitis)  Abuse/Neglect Assessment (Assessment to be complete while patient is alone) Physical Abuse:  Denies Verbal Abuse: Denies Sexual Abuse: Denies Exploitation of patient/patient's resources: Denies Self-Neglect: Denies Values / Beliefs Cultural Requests During Hospitalization: None Spiritual Requests During Hospitalization: None Consults Spiritual Care Consult Needed: No Social Work Consult Needed: No Merchant navy officer (For Healthcare) Does patient have an advance directive?: No Would patient like information on creating an advanced directive?: No - patient declined information    Additional Information 1:1 In Past 12 Months?: Yes CIRT Risk: No Elopement Risk: No Does patient have medical clearance?: Yes     Disposition:  Disposition Initial Assessment Completed for this Encounter: Yes Disposition of Patient: Inpatient treatment program (Per L. Earlene Plater, NP, Pt meets inpt criteria)  Earline Mayotte 04/23/2016 2:54 PM

## 2016-04-23 NOTE — Progress Notes (Signed)
Disposition CSW completed patient referrals to the following inpatient psych facilities:  [facility]  CSW will continue to follow patient for placement needs.  [facility] Disposition CSW [phone]

## 2016-04-23 NOTE — ED Notes (Signed)
Bed: TR32 Expected date:  Expected time:  Means of arrival:  Comments: Med clearance

## 2016-04-23 NOTE — ED Provider Notes (Signed)
[facility]-EMERGENCY DEPT Provider Note   CSN: [id number] Arrival date & time: 04/23/16  1143  First Provider Contact:  First MD Initiated Contact with Patient 04/23/16 1156        History   Chief Complaint Chief Complaint  Patient presents with  . Medical Clearance  . Suicide Attempt    HPI Tara Brown is a 55 y.o. female.  The history is provided by the patient.  Patient presents after a suicide attempt. She states 2 days ago she took all of her Ambien in a suicide attempt. States that she was upset when she woke today. She states she is tired of being sick. She asked if she would've died if she added a second month of her medication. States she took the whole bottle. She states that she should've waited another month. Denies other drug abuse. Denies substance abuse. States when she woke up she thought that it was a day earlier.  Past Medical History:  Diagnosis Date  . Major depressive disorder, recurrent, severe without psychotic features (HCC) 03/11/2015    Patient Active Problem List   Diagnosis Date Noted  . Left foot infection 12/06/2015  . Suicidal ideations 12/06/2015  . Rheumatoid arthritis (HCC) 12/06/2015  . Foot infection 12/05/2015  . Severe episode of recurrent major depressive disorder, without psychotic features (HCC)   . MDD (major depressive disorder), recurrent episode, severe (HCC) 09/11/2015  . Cellulitis of toe, left   . Chronic osteomyelitis (HCC) 09/07/2015  . Anemia, iron deficiency   . Essential hypertension   . 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 Anemia of chronic disease 03/08/2015    Past Surgical History:  Procedure Laterality Date  . AMPUTATION Left 12/07/2015   Procedure: AMPUTATION LEFT GREAT TOE;  Surgeon: Nadara Mustard, MD;  Location: [facility];  Service: Orthopedics;  Laterality: Left;  . FOOT SURGERY    . HAND SURGERY    . HIP FRACTURE SURGERY    . KNEE SURGERY    . NECK  SURGERY      OB History    No data available       Home Medications    Prior to Admission medications   Medication Sig Start Date End Date Taking? Authorizing Provider  calcium-vitamin D (OSCAL WITH D) 500-200 MG-UNIT tablet Take 1 tablet by mouth 2 (two) times daily.   Yes Historical Provider, MD  fluocinonide cream (LIDEX) 0.05 % Apply 1 application topically 2 (two) times daily as needed (for psorasis).   Yes Historical Provider, MD  folic acid (FOLVITE) 1 MG tablet Take 1 mg by mouth daily. 10/12/15  Yes Historical Provider, MD  HUMIRA PEN 40 MG/0.8ML PNKT Inject 40 mg into the skin every 14 (fourteen) days. 04/12/16  Yes Historical Provider, MD  lisinopril (PRINIVIL,ZESTRIL) 40 MG tablet Take 40 mg by mouth daily. 11/26/14  Yes Historical Provider, MD  methotrexate (RHEUMATREX) 2.5 MG tablet Take 7.5 mg by mouth 2 (two) times a week. Thursday and Friday 10/12/15  Yes Historical Provider, MD  predniSONE (DELTASONE) 10 MG tablet Take 10 mg by mouth daily. 10/12/15  Yes Historical Provider, MD  sertraline (ZOLOFT) 100 MG tablet Take 2 tablets (200 mg total) by mouth daily. 12/14/15  Yes Joseph Art, DO  traMADol (ULTRAM) 50 MG tablet Take 2 tablets (100 mg total) by mouth 4 (four) times daily -  with meals and at bedtime. 12/14/15  Yes Joseph Art, DO  zolpidem (AMBIEN) 10 MG tablet Take  10 mg by mouth at bedtime as needed for sleep.  04/18/16  Yes Historical Provider, MD  traZODone (DESYREL) 50 MG tablet Take 1 tablet (50 mg total) by mouth at bedtime. Patient not taking: Reported on 04/23/2016 12/14/15   Joseph Art, DO    Family History Family History  Problem Relation Age of Onset  . Stroke Neg Hx     Social History Social History  Substance Use Topics  . Smoking status: Never Smoker  . Smokeless tobacco: Never Used  . Alcohol use Yes     Comment: Rarely     Allergies   Review of patient's allergies indicates no known allergies.   Review of Systems Review of Systems    Constitutional: Negative for appetite change and fever.  Cardiovascular: Negative for chest pain.  Gastrointestinal: Negative for abdominal pain.  Genitourinary: Negative for difficulty urinating.  Neurological: Negative for weakness.  Psychiatric/Behavioral: Positive for dysphoric mood and suicidal ideas.     Physical Exam Updated Vital Signs There were no vitals taken for this visit.  Physical Exam  Constitutional: She appears well-developed.  Patient has some wasting.  HENT:  Head: Atraumatic.  Eyes: EOM are normal.  Cardiovascular: Normal rate.   Pulmonary/Chest: Effort normal.  Musculoskeletal:  Patient with contracted extremities. Rheumatoid hands.  Neurological: She is alert.  Skin: Skin is warm.  Psychiatric:  Patient appears depressed.     ED Treatments / Results  Labs (all labs ordered are listed, but only abnormal results are displayed) Labs Reviewed  COMPREHENSIVE METABOLIC PANEL - Abnormal; Notable for the following:       Result Value   Sodium 134 (*)    Chloride 99 (*)    Glucose, Bld 132 (*)    Creatinine, Ser 1.05 (*)    Total Protein 9.2 (*)    ALT 6 (*)    GFR calc non Af Amer 58 (*)    All other components within normal limits  CBC WITH DIFFERENTIAL/PLATELET - Abnormal; Notable for the following:    Hemoglobin 10.1 (*)    HCT 32.8 (*)    MCV 72.2 (*)    MCH 22.2 (*)    RDW 20.8 (*)    Platelets 402 (*)    All other components within normal limits  ACETAMINOPHEN LEVEL - Abnormal; Notable for the following:    Acetaminophen (Tylenol), Serum <10 (*)    All other components within normal limits  URINE RAPID DRUG SCREEN, HOSP PERFORMED  SALICYLATE LEVEL  ETHANOL    EKG  EKG Interpretation None       Radiology No results found.  Procedures Procedures (including critical care time)  Medications Ordered in ED Medications - No data to display   Initial Impression / Assessment and Plan / ED Course  I have reviewed the triage  vital signs and the nursing notes.  Pertinent labs & imaging results that were available during my care of the patient were reviewed by me and considered in my medical decision making (see chart for details).  Clinical Course    Patient with suicide attempt by overdose. At this point she appears to be a risk to herself. She is voluntary at this time but if she attempts to leave she would require commitment. She is upset she did not use a second months worth of pills in the attempt. She appears to medically cleared at this time. Around for 8 hours after ingestion I think she is cleared. To be seen by TTS.  Final  Clinical Impressions(s) / ED Diagnoses   Final diagnoses:  Suicide attempt [facility])  Depression    New Prescriptions New Prescriptions   No medications on file     Benjiman Core, MD 04/23/16 1331

## 2016-04-23 NOTE — ED Notes (Signed)
SBAR Report received from previous nurse. Pt received calm and visible on unit. Pt denies current SI/ HI, A/V H, depression, anxiety, and pain at this time, and is otherwise stable. Pt reminded of camera surveillance, q 15 min rounds, and rules of the milieu. Will continue to assess. 

## 2016-04-24 ENCOUNTER — Inpatient Hospital Stay (HOSPITAL_COMMUNITY)
Admission: AD | Admit: 2016-04-24 | Discharge: 2016-05-15 | DRG: 885 | Disposition: A | Discharge status: Home / Self Care | Payer: Medicare Other | Source: Intra-hospital | Attending: Psychiatry | Admitting: Psychiatry

## 2016-04-24 ENCOUNTER — Encounter (HOSPITAL_COMMUNITY): Payer: Self-pay | Admitting: Behavioral Health

## 2016-04-24 DIAGNOSIS — Z818 Family history of other mental and behavioral disorders: Secondary | ICD-10-CM

## 2016-04-24 DIAGNOSIS — T1491 Suicide attempt: Secondary | ICD-10-CM | POA: Diagnosis not present

## 2016-04-24 DIAGNOSIS — M069 Rheumatoid arthritis, unspecified: Secondary | ICD-10-CM | POA: Diagnosis present

## 2016-04-24 DIAGNOSIS — G8929 Other chronic pain: Secondary | ICD-10-CM | POA: Diagnosis present

## 2016-04-24 DIAGNOSIS — F332 Major depressive disorder, recurrent severe without psychotic features: Secondary | ICD-10-CM | POA: Diagnosis present

## 2016-04-24 DIAGNOSIS — R45851 Suicidal ideations: Secondary | ICD-10-CM | POA: Diagnosis present

## 2016-04-24 DIAGNOSIS — R52 Pain, unspecified: Secondary | ICD-10-CM

## 2016-04-24 DIAGNOSIS — Z79899 Other long term (current) drug therapy: Secondary | ICD-10-CM

## 2016-04-24 DIAGNOSIS — M549 Dorsalgia, unspecified: Secondary | ICD-10-CM | POA: Diagnosis present

## 2016-04-24 MED ORDER — TRAZODONE HCL 50 MG PO TABS
50.0000 mg | ORAL_TABLET | Freq: Every evening | ORAL | Status: DC | PRN
  Administered 2016-04-24: 50 mg via ORAL
  Filled 2016-04-24 (×5): qty 1

## 2016-04-24 MED ORDER — LISINOPRIL 20 MG PO TABS
40.0000 mg | ORAL_TABLET | Freq: Every day | ORAL | Status: DC
  Administered 2016-04-26: 40 mg via ORAL
  Filled 2016-04-24: qty 2
  Filled 2016-04-24 (×2): qty 1
  Filled 2016-04-24 (×2): qty 2

## 2016-04-24 MED ORDER — ACETAMINOPHEN 325 MG PO TABS
650.0000 mg | ORAL_TABLET | Freq: Four times a day (QID) | ORAL | Status: DC | PRN
Start: 2016-04-24 — End: 2016-04-26

## 2016-04-24 MED ORDER — TRAMADOL HCL 50 MG PO TABS
100.0000 mg | ORAL_TABLET | Freq: Three times a day (TID) | ORAL | Status: DC
  Administered 2016-04-24 – 2016-04-27 (×12): 100 mg via ORAL
  Filled 2016-04-24 (×12): qty 2

## 2016-04-24 MED ORDER — MAGNESIUM HYDROXIDE 400 MG/5ML PO SUSP: 30.0000 mL | Freq: Every day | ORAL | Status: DC | PRN

## 2016-04-24 MED ORDER — SERTRALINE HCL 100 MG PO TABS
200.0000 mg | ORAL_TABLET | Freq: Every day | ORAL | Status: DC
  Administered 2016-04-25: 200 mg via ORAL
  Filled 2016-04-24 (×2): qty 2

## 2016-04-24 MED ORDER — CALCIUM CARBONATE-VITAMIN D 500-200 MG-UNIT PO TABS
1.0000 | ORAL_TABLET | Freq: Two times a day (BID) | ORAL | Status: DC
  Administered 2016-04-24 – 2016-05-15 (×42): 1 via ORAL
  Filled 2016-04-24 (×47): qty 1

## 2016-04-24 MED ORDER — PREDNISONE 10 MG PO TABS
10.0000 mg | ORAL_TABLET | Freq: Every day | ORAL | Status: DC
  Administered 2016-04-25 – 2016-05-15 (×21): 10 mg via ORAL
  Filled 2016-04-24 (×10): qty 1
  Filled 2016-04-24: qty 2
  Filled 2016-04-24 (×13): qty 1

## 2016-04-24 MED ORDER — ENSURE ENLIVE PO LIQD
237.0000 mL | Freq: Two times a day (BID) | ORAL | Status: DC
  Administered 2016-04-24 – 2016-05-15 (×29): 237 mL via ORAL

## 2016-04-24 MED ORDER — FOLIC ACID 1 MG PO TABS
1.0000 mg | ORAL_TABLET | Freq: Every day | ORAL | Status: DC
  Administered 2016-04-25 – 2016-05-15 (×21): 1 mg via ORAL
  Filled 2016-04-24 (×23): qty 1

## 2016-04-24 MED ORDER — TRAZODONE HCL 50 MG PO TABS
ORAL_TABLET | ORAL | Status: AC
  Filled 2016-04-24: qty 1

## 2016-04-24 MED ORDER — ALUM & MAG HYDROXIDE-SIMETH 200-200-20 MG/5ML PO SUSP: 30.0000 mL | ORAL | Status: DC | PRN

## 2016-04-24 NOTE — Progress Notes (Signed)
Admission note: Per pt she's been feeling down and out and ready to go. Contemplating suicide for the last six months, had pills at home and decided to take an overdose of 30 Ambien pills, 10 mg each.  Pt denies any triggers that led up to her suicide attempt. Pt however, discussed feeling like she's a burden to her family because she often requires helps with bathing and dressing. Pt became tearful as she discussed having to rely on her family for help with ADLs. Pt stated that she's had  arthritis since the age of 55 years old and overtime it has progressed and caused her feet and hands to be deformed.  Pt denies having difficulty walking due to arthritis. Pt educated on fall prevention.

## 2016-04-24 NOTE — ED Notes (Signed)
Pt shared that she is just tired of living because of her pain and disability, and continues to desire death. She is able to walk without difficulty, but her hands have been crippled by Rheumatoid Arthritis. At home she has difficulty turning the stove on and other activities of daily living are difficult for her. She is able to feed herself and dress herself. She sometimes has difficulty showering alone at home.

## 2016-04-24 NOTE — Progress Notes (Signed)
D    Pt is sad and depressed but brightens on approach   Pt is drinking ensure and eating snacks   She has contractures bilateral hands and arms but does well tending to herself   She does need minimal assistance    She is appropriate in her behaviors and interacts minimally but appropriately with others    A    Verbal support given    Medications administered and effectiveness monitored   Provide necessary assist but allow pt freedom to help herself when she is able    Q 15 min checks R   Pt is safe at present and receptive to verbal support

## 2016-04-24 NOTE — Progress Notes (Signed)
Patient ID: Tara Brown, female   DOB: 10/23/1960, 55 y.o.   MRN: [id number] PER STATE REGULATIONS 482.30  THIS CHART WAS REVIEWED FOR MEDICAL NECESSITY WITH RESPECT TO THE PATIENT'S ADMISSION/DURATION OF STAY.  NEXT REVIEW DATE:04/28/16  Loura Halt, RN, BSN CASE MANAGER

## 2016-04-24 NOTE — ED Notes (Signed)
Pt transported by [employer] to [facility]. All belongings returned to pt who signed for same.

## 2016-04-24 NOTE — Consult Note (Signed)
[facility]iatry Consult   Reason for Consult:  Suicide attempt Referring Physician:  EDP Patient Identification: Tara Brown MRN:  [id number] Principal Diagnosis: Major depressive disorder, recurrent, severe without psychotic features [facility]) Diagnosis:   Patient Active Problem List   Diagnosis Date Noted  . Major depressive disorder, recurrent, severe without psychotic features ([place]) [F33.2] 03/11/2015    Priority: High  . Left foot infection [L08.9] 12/06/2015  . Suicidal ideations [R45.851] 12/06/2015  . Rheumatoid arthritis ([place]) [M06.9] 12/06/2015  . Foot infection [L08.9] 12/05/2015  . Severe episode of recurrent major depressive disorder, without psychotic features ([place]) [F33.2]   . MDD (major depressive disorder), recurrent episode, severe ([place]) [F33.2] 09/11/2015  . Cellulitis of toe, left [L03.032]   . Chronic osteomyelitis ([place]) [M86.60] 09/07/2015  . Anemia, iron deficiency [D50.9]   . Essential hypertension [I10]   . Anemia of chronic disease [D63.8] 03/08/2015    Total Time spent with patient: 45 minutes  Subjective:   Tara Brown is a 55 y.o. female patient admitted with intentional overdose.  HPI:  On admission:  54 y.o. female who presented to [facility] voluntarily after attempting to commit suicide by intentionally overdosing on 30 10 mg Ambien tabs.  Pt reported as follows:  Pt stated that she has significant and chronic arthritis pain that makes her feel helpless and despondent.  She stated that the pain was severe enough on 04/21/16 that she decided to kill herself by overdosing on the Ambien.  "I didn't want to be a burden to my family anymore."  Pt stated that she took the pills and was frustrated when she awoke on 04/22/16.  It was at that point that she decided she should come to the hospital.  In addition to her suicide attempt (her first), Pt endorsed persistent and unremitting sadness, poor appetite, difficulty sleeping without medicinal aid,  tearfulness, feelings of worthlessness and hopelessness, isolation, and loss of pleasure.    Pt takes Zoloft, which is prescribed by her PCP.  She said that she does not have the funds to see a psychiatrist or engage in therapy.  During assessment, Pt was calm and cooperative.  She had good eye contact.  She was dressed in scrubs and appeared appropriately groomed.  Pt's mood was sad and affect was congruent.  She endorsed a suicide attempt with plan and intent as well as other depressive symptoms (see above).  She denied homicidal ideation, self-injury, auditory/visual hallucination, a history of abuse, and substance use.  Pt's speech was normal in rate, rhythm, and volume.  Pt's memory and concentration were intact.  Thought processes were within normal limits.  Thought content was goal-oriented.  Impulse control, insight, and judgment were deemed poor as evidenced by recent suicide attempt.  Today, patient continues to endorse depression with suicidal ideations.  She is "tired of living this way, tired of life due to my limitations."  She is unable to take a shower, cook, or other things independently due to her disability.  Her sister was paying for her aide for four hours a week but will no longer pay for it and now she want have a bath.  Frustrated and depressed, no hallucinations, alcohol/drug abuse.  No homicidal ideations but suicidal ideations continue.  Past Psychiatric History: depression  Risk to Self: Suicidal Ideation: Yes-Currently Present Suicidal Intent: Yes-Currently Present Is patient at risk for suicide?: Yes Suicidal Plan?: Yes-Currently Present Specify Current Suicidal Plan: Pt overdosed on 30 Ambien 10 mg tabs Access to Means: No What has  been your use of drugs/alcohol within the last 12 months?: Denied Intentional Self Injurious Behavior: None Risk to Others: Homicidal Ideation: No Thoughts of Harm to Others: No Current Homicidal Intent: No Current Homicidal Plan:  No Access to Homicidal Means: No History of harm to others?: No Assessment of Violence: None Noted Does patient have access to weapons?: No Criminal Charges Pending?: No Does patient have a court date: No Prior Inpatient Therapy: Prior Inpatient Therapy: Yes Prior Therapy Dates: December 2016-Jan 2017 Prior Therapy Facilty/Provider(s): [facility] Reason for Treatment: Depressive symptoms Prior Outpatient Therapy: Prior Outpatient Therapy: No Does patient have an ACCT team?: No Does patient have Intensive In-House Services?  : No Does patient have [facility] services? : No Does patient have P4CC services?: No  Past Medical History:  Past Medical History:  Diagnosis Date  . Arthritis   . Major depressive disorder, recurrent, severe without psychotic features ([place]) 03/11/2015    Past Surgical History:  Procedure Laterality Date  . AMPUTATION Left 12/07/2015   Procedure: AMPUTATION LEFT GREAT TOE;  Surgeon: Newt Minion, MD;  Location: [facility];  Service: Orthopedics;  Laterality: Left;  . FOOT SURGERY    . HAND SURGERY    . HIP FRACTURE SURGERY    . KNEE SURGERY    . NECK SURGERY     Family History:  Family History  Problem Relation Age of Onset  . Stroke Neg Hx    Family Psychiatric  History: none Social History:  History  Alcohol Use  . Yes    Comment: Rarely     History  Drug Use No    Social History   Social History  . Marital status: Divorced    Spouse name: N/A  . Number of children: N/A  . Years of education: N/A   Social History Main Topics  . Smoking status: Never Smoker  . Smokeless tobacco: Never Used  . Alcohol use Yes     Comment: Rarely  . Drug use: No  . Sexual activity: No   Other Topics Concern  . None   Social History Narrative  . None   Additional Social History:    Allergies:  No Known Allergies  Labs:  Results for orders placed or performed during the hospital encounter of 04/23/16 (from the past 48 hour(s))  Urine rapid drug screen  (hosp performed)     Status: None   Collection Time: 04/23/16 12:08 PM  Result Value Ref Range   Opiates NONE DETECTED NONE DETECTED   Cocaine NONE DETECTED NONE DETECTED   Benzodiazepines NONE DETECTED NONE DETECTED   Amphetamines NONE DETECTED NONE DETECTED   Tetrahydrocannabinol NONE DETECTED NONE DETECTED   Barbiturates NONE DETECTED NONE DETECTED    Comment:        DRUG SCREEN FOR MEDICAL PURPOSES ONLY.  IF CONFIRMATION IS NEEDED FOR ANY PURPOSE, NOTIFY LAB WITHIN 5 DAYS.        LOWEST DETECTABLE LIMITS FOR URINE DRUG SCREEN Drug Class       Cutoff (ng/mL) Amphetamine      1000 Barbiturate      200 Benzodiazepine   469 Tricyclics       629 Opiates          300 Cocaine          300 THC              50   Comprehensive metabolic panel     Status: Abnormal   Collection Time: 04/23/16 12:33 PM  Result  Value Ref Range   Sodium 134 (L) 135 - 145 mmol/L   Potassium 3.8 3.5 - 5.1 mmol/L   Chloride 99 (L) 101 - 111 mmol/L   CO2 22 22 - 32 mmol/L   Glucose, Bld 132 (H) 65 - 99 mg/dL   BUN 15 6 - 20 mg/dL   Creatinine, Ser 1.05 (H) 0.44 - 1.00 mg/dL   Calcium 9.3 8.9 - 10.3 mg/dL   Total Protein 9.2 (H) 6.5 - 8.1 g/dL   Albumin 3.5 3.5 - 5.0 g/dL   AST 18 15 - 41 U/L   ALT 6 (L) 14 - 54 U/L   Alkaline Phosphatase 79 38 - 126 U/L   Total Bilirubin 0.6 0.3 - 1.2 mg/dL   GFR calc non Af Amer 58 (L) >60 mL/min   GFR calc Af Amer >60 >60 mL/min    Comment: (NOTE) The eGFR has been calculated using the CKD EPI equation. This calculation has not been validated in all clinical situations. eGFR's persistently <60 mL/min signify possible Chronic Kidney Disease.    Anion gap 13 5 - 15  CBC with Differential     Status: Abnormal   Collection Time: 04/23/16 12:33 PM  Result Value Ref Range   WBC 8.1 4.0 - 10.5 K/uL   RBC 4.54 3.87 - 5.11 MIL/uL   Hemoglobin 10.1 (L) 12.0 - 15.0 g/dL   HCT 32.8 (L) 36.0 - 46.0 %   MCV 72.2 (L) 78.0 - 100.0 fL   MCH 22.2 (L) 26.0 - 34.0 pg    MCHC 30.8 30.0 - 36.0 g/dL   RDW 20.8 (H) 11.5 - 15.5 %   Platelets 402 (H) 150 - 400 K/uL   Neutrophils Relative % 76 %   Lymphocytes Relative 17 %   Monocytes Relative 7 %   Eosinophils Relative 0 %   Basophils Relative 0 %   Neutro Abs 6.1 1.7 - 7.7 K/uL   Lymphs Abs 1.4 0.7 - 4.0 K/uL   Monocytes Absolute 0.6 0.1 - 1.0 K/uL   Eosinophils Absolute 0.0 0.0 - 0.7 K/uL   Basophils Absolute 0.0 0.0 - 0.1 K/uL   Smear Review MORPHOLOGY UNREMARKABLE   Acetaminophen level     Status: Abnormal   Collection Time: 04/23/16 12:33 PM  Result Value Ref Range   Acetaminophen (Tylenol), Serum <10 (L) 10 - 30 ug/mL    Comment:        THERAPEUTIC CONCENTRATIONS VARY SIGNIFICANTLY. A RANGE OF 10-30 ug/mL MAY BE AN EFFECTIVE CONCENTRATION FOR MANY PATIENTS. HOWEVER, SOME ARE BEST TREATED AT CONCENTRATIONS OUTSIDE THIS RANGE. ACETAMINOPHEN CONCENTRATIONS >150 ug/mL AT 4 HOURS AFTER INGESTION AND >50 ug/mL AT 12 HOURS AFTER INGESTION ARE OFTEN ASSOCIATED WITH TOXIC REACTIONS.   Salicylate level     Status: None   Collection Time: 04/23/16 12:33 PM  Result Value Ref Range   Salicylate Lvl <2.2 2.8 - 30.0 mg/dL  Ethanol     Status: None   Collection Time: 04/23/16 12:33 PM  Result Value Ref Range   Alcohol, Ethyl (B) <5 <5 mg/dL    Comment:        LOWEST DETECTABLE LIMIT FOR SERUM ALCOHOL IS 5 mg/dL FOR MEDICAL PURPOSES ONLY     Current Facility-Administered Medications  Medication Dose Route Frequency Provider Last Rate Last Dose  . calcium-vitamin D (OSCAL WITH D) 500-200 MG-UNIT per tablet 1 tablet  1 tablet Oral BID Davonna Belling, MD   1 tablet at 04/24/16 1002  . folic acid (FOLVITE) tablet  1 mg  1 mg Oral Daily Davonna Belling, MD   1 mg at 04/24/16 1002  . lisinopril (PRINIVIL,ZESTRIL) tablet 40 mg  40 mg Oral Daily Davonna Belling, MD   40 mg at 04/24/16 1003  . predniSONE (DELTASONE) tablet 10 mg  10 mg Oral Daily Davonna Belling, MD   10 mg at 04/24/16 1002  .  sertraline (ZOLOFT) tablet 200 mg  200 mg Oral Daily Davonna Belling, MD   200 mg at 04/24/16 1003  . traMADol (ULTRAM) tablet 100 mg  100 mg Oral TID WC & HS Davonna Belling, MD   100 mg at 04/24/16 1246   Current Outpatient Prescriptions  Medication Sig Dispense Refill  . calcium-vitamin D (OSCAL WITH D) 500-200 MG-UNIT tablet Take 1 tablet by mouth 2 (two) times daily.    . fluocinonide cream (LIDEX) 2.72 % Apply 1 application topically 2 (two) times daily as needed (for psorasis).    . folic acid (FOLVITE) 1 MG tablet Take 1 mg by mouth daily.  0  . HUMIRA PEN 40 MG/0.8ML PNKT Inject 40 mg into the skin every 14 (fourteen) days.  0  . lisinopril (PRINIVIL,ZESTRIL) 40 MG tablet Take 40 mg by mouth daily.    . methotrexate (RHEUMATREX) 2.5 MG tablet Take 7.5 mg by mouth 2 (two) times a week. Thursday and Friday  0  . predniSONE (DELTASONE) 10 MG tablet Take 10 mg by mouth daily.  0  . sertraline (ZOLOFT) 100 MG tablet Take 2 tablets (200 mg total) by mouth daily. 20 tablet 0  . traMADol (ULTRAM) 50 MG tablet Take 2 tablets (100 mg total) by mouth 4 (four) times daily -  with meals and at bedtime. 30 tablet 0  . zolpidem (AMBIEN) 10 MG tablet Take 10 mg by mouth at bedtime as needed for sleep.     . traZODone (DESYREL) 50 MG tablet Take 1 tablet (50 mg total) by mouth at bedtime. (Patient not taking: Reported on 04/23/2016) 10 tablet 0    Musculoskeletal: Strength & Muscle Tone: within normal limits Gait & Station: normal Patient leans: N/A  Psychiatric Specialty Exam: Physical Exam  Constitutional: She is oriented to person, place, and time. She appears well-developed and well-nourished.  HENT:  Head: Normocephalic.  Neck: Normal range of motion.  Respiratory: Effort normal.  Musculoskeletal: Normal range of motion.  Neurological: She is alert and oriented to person, place, and time.  Skin: Skin is warm and dry.  Psychiatric: Her speech is normal and behavior is normal. Judgment  normal. Cognition and memory are normal. She exhibits a depressed mood. She expresses suicidal ideation. She expresses suicidal plans.    Review of Systems  Constitutional: Negative.   HENT: Negative.   Eyes: Negative.   Respiratory: Negative.   Cardiovascular: Negative.   Gastrointestinal: Negative.   Genitourinary: Negative.   Musculoskeletal: Negative.   Skin: Negative.   Neurological: Negative.   Endo/Heme/Allergies: Negative.   Psychiatric/Behavioral: Positive for depression and suicidal ideas.    Blood pressure 92/61, pulse 68, temperature 97.6 F (36.4 C), temperature source Oral, resp. rate 16, SpO2 96 %.There is no height or weight on file to calculate BMI.  General Appearance: Disheveled  Eye Contact:  Fair  Speech:  Normal Rate  Volume:  Decreased  Mood:  Depressed  Affect:  Congruent  Thought Process:  Coherent and Descriptions of Associations: Intact  Orientation:  Full (Time, Place, and Person)  Thought Content:  Rumination  Suicidal Thoughts:  Yes.  with intent/plan  Homicidal Thoughts:  No  Memory:  Immediate;   Fair Recent;   Fair Remote;   Fair  Judgement:  Poor  Insight:  Fair  Psychomotor Activity:  Decreased  Concentration:  Concentration: Fair and Attention Span: Fair  Recall:  [employer] of Knowledge:  Fair  Language:  Good  Akathisia:  No  Handed:  Right  AIMS (if indicated):     Assets:  Housing Leisure Time Resilience  ADL's:  Impaired  Cognition:  WNL  Sleep:        Treatment Plan Summary: Daily contact with patient to assess and evaluate symptoms and progress in treatment, Medication management and Plan major depressive disorder, recurrent, severe without psychosis:  -Crisis stabilization -Medication management:  Restart home medications along with Zoloft 200 mg daily for depression -Individual counseling  Disposition: Recommend psychiatric Inpatient admission when medically cleared.  Waylan Boga, NP 04/24/2016 12:46 PM

## 2016-04-24 NOTE — [facility] Assessment (Signed)
[facility] Assessment Progress Note  Per Nelly Rout, MD, this pt requires psychiatric hospitalization at this time.  Berneice Heinrich, RN, [facility] has assigned pt to [facility] Rm 402-1.  Pt has signed Voluntary Admission and Consent for Treatment, as well as Consent to Release Information to no one, and signed forms have been faxed to [facility].  Pt's nurse, Diane, has been notified, and agrees to send original paperwork along with pt via [facility], and to call report to [phone].  Doylene Canning, MA Triage Specialist [phone]

## 2016-04-24 NOTE — Tx Team (Signed)
Initial Interdisciplinary Treatment Plan   PATIENT STRESSORS: Financial difficulties Marital or family conflict   PATIENT STRENGTHS: Ability for insight Capable of independent living   PROBLEM LIST: Problem List/Patient Goals Date to be addressed Date deferred Reason deferred Estimated date of resolution  "To be started  on an Antidepressant that works better than current med" 04/24/16     "To have a better outlook on life" 04/24/16                                                DISCHARGE CRITERIA:  Ability to meet basic life and health needs Adequate post-discharge living arrangements Motivation to continue treatment in a less acute level of care  PRELIMINARY DISCHARGE PLAN: Attend aftercare/continuing care group Attend PHP/IOP  PATIENT/FAMIILY INVOLVEMENT: This treatment plan has been presented to and reviewed with the patient, Tara Brown, and/or family member.  The patient and family have been given the opportunity to ask questions and make suggestions.  Tara Brown L 04/24/2016, 3:53 PM

## 2016-04-25 ENCOUNTER — Encounter (HOSPITAL_COMMUNITY): Payer: Self-pay | Admitting: Psychiatry

## 2016-04-25 MED ORDER — SERTRALINE HCL 50 MG PO TABS
50.0000 mg | ORAL_TABLET | Freq: Every day | ORAL | Status: DC
  Filled 2016-04-25: qty 1

## 2016-04-25 MED ORDER — SERTRALINE HCL 50 MG PO TABS
50.0000 mg | ORAL_TABLET | Freq: Every day | ORAL | Status: DC
  Administered 2016-04-26: 50 mg via ORAL
  Filled 2016-04-25 (×3): qty 1

## 2016-04-25 MED ORDER — DULOXETINE HCL 30 MG PO CPEP
30.0000 mg | ORAL_CAPSULE | Freq: Every day | ORAL | Status: DC
  Administered 2016-04-25 – 2016-04-28 (×4): 30 mg via ORAL
  Filled 2016-04-25 (×6): qty 1

## 2016-04-25 MED ORDER — TRAZODONE HCL 100 MG PO TABS
100.0000 mg | ORAL_TABLET | Freq: Every day | ORAL | Status: DC
  Administered 2016-04-25 – 2016-04-26 (×2): 100 mg via ORAL
  Filled 2016-04-25 (×5): qty 1

## 2016-04-25 MED ORDER — LIDOCAINE 5 % EX PTCH
2.0000 | MEDICATED_PATCH | CUTANEOUS | Status: DC
  Administered 2016-04-25 – 2016-04-27 (×3): 2 via TRANSDERMAL
  Administered 2016-04-28: 1 via TRANSDERMAL
  Administered 2016-04-29 – 2016-05-15 (×18): 2 via TRANSDERMAL
  Filled 2016-04-25 (×23): qty 2

## 2016-04-25 NOTE — H&P (Signed)
Psychiatric Admission Assessment Adult  Patient Identification: Tara Brown  MRN:  [id number]  Date of Evaluation:  04/25/2016  Chief Complaint:Pt states " I want to die and not be a burden to anyone.'     Principal Diagnosis: MDD (major depressive disorder), recurrent episode, severe ([place])  Diagnosis:   Patient Active Problem List   Diagnosis Date Noted  . Back pain [M54.9] 04/25/2016  . Major depressive disorder, recurrent severe without psychotic features ([place]) [F33.2] 04/24/2016  . Left foot infection [L08.9] 12/06/2015  . Suicidal ideations [R45.851] 12/06/2015  . Rheumatoid arthritis ([place]) [M06.9] 12/06/2015  . Foot infection [L08.9] 12/05/2015  . Cellulitis of toe, left [L03.032]   . Chronic osteomyelitis ([place]) [M86.60] 09/07/2015  . Anemia, iron deficiency [D50.9]   . Essential hypertension [I10]   . 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   Per initial notes in EHR : "Tara Brown is a 55 y.o. female who presented to [facility] voluntarily after attempting to commit suicide by intentionally overdosing on 30 10 mg Ambien tabs.  Pt reported as follows:  Pt stated that she has significant and chronic arthritis pain that makes her feel helpless and despondent.  She stated that the pain was severe enough on 04/21/16 that she decided to kill herself by overdosing on the Ambien.  "I didn't want to be a burden to my family anymore."  Pt stated that she took the pills and was frustrated when she awoke on 04/22/16.  It was at that point that she decided she should come to the hospital.  In addition to her suicide attempt (her first), Pt endorsed persistent and unremitting  sadness, poor appetite, difficulty sleeping without medicinal aid, tearfulness, feelings of worthlessness and hopelessness, isolation, and loss of pleasure.  Pt takes Zoloft, which is prescribed by her PCP.  She said that she does not have the funds to see a psychiatrist or engage in therapy."  Patient seen and chart reviewed TODAY .Discussed patient with treatment team. Pt reports her depression has been worsening and she does not feel zoloft is helping at all. She has sadness all day , sleep issues , has not been able to take her meals properly due to loss of appetite as well as inability to cook her meals due to her disabling arthritis. She has difficulty washing her dishes , her clothes or brush her hair. She can barely open a can and needs assistance with cooking cleaning and other activities. Pt reports she feels so depressed that she wants to die and not be a burden to her family. Pt reports she continues to have SI and will possibly come up with a plan on discharge , wants to do it alone at home .  Pt reports past hx of depression , has tried lexapro 20 mg and zoloft 200 mg - both are not effective. Wants to try a new AD. Pt denies AH/VH at this time.        Associated Signs/Symptoms:  Depression Symptoms:  depressed mood, anhedonia, insomnia, recurrent thoughts of death, loss of energy/fatigue, weight loss, decreased appetite, decreased sense of self esteem  (Hypo) Manic Symptoms:  denies  Anxiety Symptoms:  Anxious rumination about difficulties in caring for herself for ADL's  Psychotic Symptoms:  denies   PTSD Symptoms: denies   Total Time spent with patient: 45 minutes   Past Psychiatric History: This is her  4 th psychiatric admission, denies history of self cutting , denies history of psychosis, denies history of mania. Suicide attempt - x 1 ( OD on pills - this admission) . She follows up with her PA -PMD.   Risk to Self: Is patient at risk for suicide?:  No Risk to Others: No Prior Inpatient Therapy: Yes Prior Outpatient Therapy: Yes  Alcohol Screening: 1. How often do you have a drink containing alcohol?: Monthly or less 2. How many drinks containing alcohol do you have on a typical day when you are drinking?: 1 or 2 3. How often do you have six or more drinks on one occasion?: Never Preliminary Score: 0 9. Have you or someone else been injured as a result of your drinking?: No 10. Has a relative or friend or a doctor or another health worker been concerned about your drinking or suggested you cut down?: No Alcohol Use Disorder Identification Test Final Score (AUDIT): 1 Brief Intervention: AUDIT score less than 7 or less-screening does not suggest unhealthy drinking-brief intervention not indicated  Substance Abuse History in the last 12 months: No   Consequences of Substance Abuse:  NA  Previous Psychotropic Medications: Lexapro, zoloft, ambien.  Psychological Evaluations: No   Past Medical History:   Past Medical History:  Diagnosis Date  . Arthritis   . Major depressive disorder, recurrent, severe without psychotic features ([place]) 03/11/2015    Past Surgical History:  Procedure Laterality Date  . AMPUTATION Left 12/07/2015   Procedure: AMPUTATION LEFT GREAT TOE;  Surgeon: Newt Minion, MD;  Location: [facility];  Service: Orthopedics;  Laterality: Left;  . FOOT SURGERY    . HAND SURGERY    . HIP FRACTURE SURGERY    . KNEE SURGERY    . NECK SURGERY     Family History:  Cancer runs in the family .  Family History  Problem Relation Age of Onset  . Bipolar disorder Sister   . Suicidality Other   . Schizophrenia Other   . Stroke Neg Hx    Family Psychiatric  History:Mother deceased, father alive , has younger sisters, states father may be alcoholic, does not endorse mental illness in family, but a nephew committed suicide .Nephew was diagnosed with schizophrenia. Sister may have bipolar disorder.  Social History:  Patient  is divorced, has no children, is on disability, lives alone . No legal issues. As noted, major stressor at this time is worsening ability to function independently due to progression of her rheumatologic disease . History  Alcohol Use  . Yes    Comment: Rarely     History  Drug Use No    Social History   Social History  . Marital status: Divorced    Spouse name: N/A  . Number of children: N/A  . Years of education: N/A   Social History Main Topics  . Smoking status: Never Smoker  . Smokeless tobacco: Never Used  . Alcohol use Yes     Comment: Rarely  . Drug use: No  . Sexual activity: No   Other Topics Concern  . None   Social History Narrative  . None   Additional Social History:  Allergies:  No Known Allergies  Lab Results:  Results for orders placed or  performed during the hospital encounter of 04/23/16 (from the past 48 hour(s))  Comprehensive metabolic panel     Status: Abnormal   Collection Time: 04/23/16 12:33 PM  Result Value Ref Range   Sodium 134 (L) 135 - 145 mmol/L   Potassium 3.8 3.5 - 5.1 mmol/L   Chloride 99 (L) 101 - 111 mmol/L   CO2 22 22 - 32 mmol/L   Glucose, Bld 132 (H) 65 - 99 mg/dL   BUN 15 6 - 20 mg/dL   Creatinine, Ser 1.05 (H) 0.44 - 1.00 mg/dL   Calcium 9.3 8.9 - 10.3 mg/dL   Total Protein 9.2 (H) 6.5 - 8.1 g/dL   Albumin 3.5 3.5 - 5.0 g/dL   AST 18 15 - 41 U/L   ALT 6 (L) 14 - 54 U/L   Alkaline Phosphatase 79 38 - 126 U/L   Total Bilirubin 0.6 0.3 - 1.2 mg/dL   GFR calc non Af Amer 58 (L) >60 mL/min   GFR calc Af Amer >60 >60 mL/min    Comment: (NOTE) The eGFR has been calculated using the CKD EPI equation. This calculation has not been validated in all clinical situations. eGFR's persistently <60 mL/min signify possible Chronic Kidney Disease.    Anion gap 13 5 - 15  CBC with Differential     Status: Abnormal   Collection Time: 04/23/16 12:33 PM  Result Value Ref Range   WBC 8.1 4.0 - 10.5 K/uL   RBC 4.54 3.87 - 5.11  MIL/uL   Hemoglobin 10.1 (L) 12.0 - 15.0 g/dL   HCT 32.8 (L) 36.0 - 46.0 %   MCV 72.2 (L) 78.0 - 100.0 fL   MCH 22.2 (L) 26.0 - 34.0 pg   MCHC 30.8 30.0 - 36.0 g/dL   RDW 20.8 (H) 11.5 - 15.5 %   Platelets 402 (H) 150 - 400 K/uL   Neutrophils Relative % 76 %   Lymphocytes Relative 17 %   Monocytes Relative 7 %   Eosinophils Relative 0 %   Basophils Relative 0 %   Neutro Abs 6.1 1.7 - 7.7 K/uL   Lymphs Abs 1.4 0.7 - 4.0 K/uL   Monocytes Absolute 0.6 0.1 - 1.0 K/uL   Eosinophils Absolute 0.0 0.0 - 0.7 K/uL   Basophils Absolute 0.0 0.0 - 0.1 K/uL   Smear Review MORPHOLOGY UNREMARKABLE   Acetaminophen level     Status: Abnormal   Collection Time: 04/23/16 12:33 PM  Result Value Ref Range   Acetaminophen (Tylenol), Serum <10 (L) 10 - 30 ug/mL    Comment:        THERAPEUTIC CONCENTRATIONS VARY SIGNIFICANTLY. A RANGE OF 10-30 ug/mL MAY BE AN EFFECTIVE CONCENTRATION FOR MANY PATIENTS. HOWEVER, SOME ARE BEST TREATED AT CONCENTRATIONS OUTSIDE THIS RANGE. ACETAMINOPHEN CONCENTRATIONS >150 ug/mL AT 4 HOURS AFTER INGESTION AND >50 ug/mL AT 12 HOURS AFTER INGESTION ARE OFTEN ASSOCIATED WITH TOXIC REACTIONS.   Salicylate level     Status: None   Collection Time: 04/23/16 12:33 PM  Result Value Ref Range   Salicylate Lvl <8.4 2.8 - 30.0 mg/dL  Ethanol     Status: None   Collection Time: 04/23/16 12:33 PM  Result Value Ref Range   Alcohol, Ethyl (B) <5 <5 mg/dL    Comment:        LOWEST DETECTABLE LIMIT FOR SERUM ALCOHOL IS 5 mg/dL FOR MEDICAL PURPOSES ONLY     Metabolic Disorder Labs:  No results found for: HGBA1C, MPG No results found for: PROLACTIN No  results found for: CHOL, TRIG, HDL, CHOLHDL, VLDL, LDLCALC  Current Medications: Current Facility-Administered Medications  Medication Dose Route Frequency Provider Last Rate Last Dose  . acetaminophen (TYLENOL) tablet 650 mg  650 mg Oral Q6H PRN Patrecia Pour, NP      . alum & mag hydroxide-simeth (MAALOX/MYLANTA)  200-200-20 MG/5ML suspension 30 mL  30 mL Oral Q4H PRN Patrecia Pour, NP      . calcium-vitamin D (OSCAL WITH D) 500-200 MG-UNIT per tablet 1 tablet  1 tablet Oral BID Patrecia Pour, NP   1 tablet at 04/25/16 0804  . DULoxetine (CYMBALTA) DR capsule 30 mg  30 mg Oral Daily Ursula Alert, MD   30 mg at 04/25/16 1152  . feeding supplement (ENSURE ENLIVE) (ENSURE ENLIVE) liquid 237 mL  237 mL Oral BID BM Myer Peer Cobos, MD   237 mL at 77/93/90 3009  . folic acid (FOLVITE) tablet 1 mg  1 mg Oral Daily Patrecia Pour, NP   1 mg at 04/25/16 0804  . lidocaine (LIDODERM) 5 % 2 patch  2 patch Transdermal Q24H Ursula Alert, MD   2 patch at 04/25/16 1151  . lisinopril (PRINIVIL,ZESTRIL) tablet 40 mg  40 mg Oral Daily Patrecia Pour, NP      . magnesium hydroxide (MILK OF MAGNESIA) suspension 30 mL  30 mL Oral Daily PRN Patrecia Pour, NP      . predniSONE (DELTASONE) tablet 10 mg  10 mg Oral Daily Patrecia Pour, NP   10 mg at 04/25/16 0803  . [START ON 04/26/2016] sertraline (ZOLOFT) tablet 50 mg  50 mg Oral Daily Michala Deblanc, MD      . traMADol (ULTRAM) tablet 100 mg  100 mg Oral TID WC & HS Patrecia Pour, NP   100 mg at 04/25/16 1152  . traZODone (DESYREL) tablet 100 mg  100 mg Oral QHS Ursula Alert, MD       PTA Medications: Prescriptions Prior to Admission  Medication Sig Dispense Refill Last Dose  . calcium-vitamin D (OSCAL WITH D) 500-200 MG-UNIT tablet Take 1 tablet by mouth 2 (two) times daily.   over a week  . fluocinonide cream (LIDEX) 2.33 % Apply 1 application topically 2 (two) times daily as needed (for psorasis).   Past Month at Unknown time  . folic acid (FOLVITE) 1 MG tablet Take 1 mg by mouth daily.  0 over a Week at Unknown time  . HUMIRA PEN 40 MG/0.8ML PNKT Inject 40 mg into the skin every 14 (fourteen) days.  0 2 weeks  . lisinopril (PRINIVIL,ZESTRIL) 40 MG tablet Take 40 mg by mouth daily.   over a week at Unknown time  . methotrexate (RHEUMATREX) 2.5 MG tablet Take 7.5  mg by mouth 2 (two) times a week. Thursday and Friday  0 04/14/2016  . predniSONE (DELTASONE) 10 MG tablet Take 10 mg by mouth daily.  0 2 weeks  . sertraline (ZOLOFT) 100 MG tablet Take 2 tablets (200 mg total) by mouth daily. 20 tablet 0 over a week  . traMADol (ULTRAM) 50 MG tablet Take 2 tablets (100 mg total) by mouth 4 (four) times daily -  with meals and at bedtime. 30 tablet 0 04/23/2016 at Unknown time  . traZODone (DESYREL) 50 MG tablet Take 1 tablet (50 mg total) by mouth at bedtime. (Patient not taking: Reported on 04/23/2016) 10 tablet 0 Not Taking at Unknown time  . zolpidem (AMBIEN) 10 MG tablet Take 10 mg  by mouth at bedtime as needed for sleep.    04/20/2016 at Unknown time   Musculoskeletal: Strength & Muscle Tone: within normal limits-   Gait & Station: unsteady, impaired range of motion related to chronic arthritis Patient leans: N/A  Psychiatric Specialty Exam: Physical Exam  Nursing note and vitals reviewed. Constitutional: She is oriented to person, place, and time. She appears well-developed.  I concur with PE done in ED  HENT:  Head: Normocephalic.  Eyes: Pupils are equal, round, and reactive to light.  Neck: Normal range of motion.  Cardiovascular: Normal rate.   Respiratory: Effort normal.  GI: Soft.  Genitourinary:  Genitourinary Comments: Denies any issues in this area  Musculoskeletal: Normal range of motion.  Neurological: She is alert and oriented to person, place, and time.  Skin: Skin is warm and dry.  Psychiatric: Her speech is normal and behavior is normal. Judgment and thought content normal. Her mood appears anxious. Her affect is not angry, not blunt, not labile and not inappropriate. Cognition and memory are normal. She exhibits a depressed mood.    Review of Systems  Constitutional: Positive for malaise/fatigue and weight loss.  HENT: Negative.   Eyes: Negative.   Respiratory: Negative.   Cardiovascular: Negative.   Gastrointestinal: Negative  for nausea and vomiting.  Genitourinary: Negative.   Musculoskeletal: Positive for joint pain.       Chronic arthritis affecting multiple joints    Skin: Negative.   Neurological: Negative for seizures.  Endo/Heme/Allergies: Negative.   Psychiatric/Behavioral: Positive for depression and suicidal ideas (S/P ATTEMPT). Negative for hallucinations, memory loss and substance abuse. The patient is nervous/anxious and has insomnia.   All other systems reviewed and are negative.   Blood pressure (!) 85/65, pulse 79, temperature 98 F (36.7 C), temperature source Oral, resp. rate 18, height 5' 4"  (1.626 m), weight 46.3 kg (102 lb), SpO2 100 %.Body mass index is 17.51 kg/m.  General Appearance: Fairly Groomed  Engineer, water::  Good  Speech:  Normal Rate  Volume:  Normal  Mood:  Depressed  Affect:  Appropriate, Congruent and Depressed  Thought Process:  Coherent, Goal Directed and Linear  Orientation:  Full (Time, Place, and Person)  Thought Content:  Logical and Rumination  Suicidal Thoughts:  Yes, wants to die at home alone- S/P suicide attempt by OD on ambien.  Homicidal Thoughts:  No  Memory:  Immediate;   Fair Recent;   Fair Remote;   Fair  Judgement:  Fair  Insight:  Present  Psychomotor Activity:  Decreased-  Concentration:  Good  Recall:  Good  Fund of Knowledge:Good  Language: Good  Akathisia:  Negative  Handed:  Right  AIMS (if indicated):     Assets:  Communication Skills Resilience  ADL's:   Fair   Cognition: WNL  Sleep:  Number of Hours: 5.25   Treatment Plan Summary:Citlaly is a 55 year old  Caucasian female who is divorced , on SSD , who lives in [place], who has a hx of depression, as well as disabling medical condition of rheumatoid arthritis , 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 Pt continues to be depressed and suicidal , her disabling medical condition is her major stressor.Will need IP stay.   Daily  contact with patient to assess and evaluate symptoms and progress in treatment, Medication management, Plan inpatient admission and medications as below   Patient will benefit from inpatient treatment and stabilization.  Estimated length  of stay is 5-7 days.  Reviewed past medical records,treatment plan.  Will start tapering off Zoloft for lack of efficacy. Start Cymbalta 30 mg po daily for affective sx. Will continue Trazodone , increase to 100 mg po qhs for sleep. Will restart home medications where indicated. Will continue to monitor vitals ,medication compliance and treatment side effects while patient is here.  Will monitor for medical issues as well as call consult as needed.  Reviewed labs Na+ - low, hb/hct -low , uds - negative ,will order repeat BMP , get iron panel, vitamin b12, folate, vitamin d as well as tsh. PT consult. CSW will start working on disposition. Will need referral to home health aid versus ALF. Patient to participate in therapeutic milieu .       Observation Level/Precautions:  15 minute checks  Laboratory: Per ED  Psychotherapy:  Group therapy, individual therapy, psychoeducation    Consultations: CSW/PT  Discharge Concerns: Safety,stability       I certify that inpatient services furnished can reasonably be expected to improve the patient's condition.    Ursula Alert, MD 8/15/201712:11 PM

## 2016-04-25 NOTE — Progress Notes (Signed)
Recreation Therapy Notes    Animal-Assisted Activity (AAA) Program Checklist/Progress Notes Patient Eligibility Criteria Checklist & Daily Group note for Rec TxIntervention  Date: 08.15.2017 Time: 2:45pm Location: [address]   AAA/T Program Assumption of Risk Form signed by Patient/ or Parent Legal Guardian Yes  Patient is free of allergies or sever asthma Yes  Patient reports no fear of animals Yes  Patient reports no history of cruelty to animals Yes  Patient understands his/her participation is voluntary Yes  Patient washes hands before animal contact Yes  Patient washes hands after animal contact Yes  Behavioral Response: Engaged, Attentive, Appropriate   Education:Hand Washing, Appropriate Animal Interaction   Education Outcome: Acknowledges education.   Clinical Observations/Feedback: Patient attended session and interacted appropriately with therapy dog and peers.   [name] Stasia Somero, LRT/CTRS  Seva Chancy L 04/25/2016 3:12 PM

## 2016-04-25 NOTE — [facility] Suicide Risk Assessment (Signed)
[facility] Admission Suicide Risk Assessment   Nursing information obtained from:  Patient Demographic factors:  Divorced or widowed, Caucasian, Low socioeconomic status, Living alone, Unemployed Current Mental Status:  Suicidal ideation indicated by patient, Suicide plan, Self-harm thoughts, Intention to act on suicide plan Loss Factors:  Decline in physical health, Financial problems / change in socioeconomic status Historical Factors:  Family history of suicide, Family history of mental illness or substance abuse Risk Reduction Factors:  Religious beliefs about death, Positive social support  Total Time spent with patient: 30 minutes Principal Problem: Major depressive disorder, recurrent severe without psychotic features ([facility]) Diagnosis:   Patient Active Problem List   Diagnosis Date Noted  . Back pain [M54.9] 04/25/2016  . Major depressive disorder, recurrent severe without psychotic features ([facility]) [F33.2] 04/24/2016  . Left foot infection [L08.9] 12/06/2015  . Suicidal ideations [R45.851] 12/06/2015  . Rheumatoid arthritis ([facility]) [M06.9] 12/06/2015  . Foot infection [L08.9] 12/05/2015  . Cellulitis of toe, left [L03.032]   . Chronic osteomyelitis ([facility]CC) [M86.60] 09/07/2015  . Anemia, iron deficiency [D50.9]   . Essential hypertension [I10]   . Anemia of chronic disease [D63.8] 03/08/2015   Subjective Data: Please see H&P.   Continued Clinical Symptoms:  Alcohol Use Disorder Identification Test Final Score (AUDIT): 1 The "Alcohol Use Disorders Identification Test", Guidelines for Use in Primary Care, Second Edition.  World Science writer [facility]). Score between 0-7:  no or low risk or alcohol related problems. Score between 8-15:  moderate risk of alcohol related problems. Score between 16-19:  high risk of alcohol related problems. Score 20 or above:  warrants further diagnostic evaluation for alcohol dependence and treatment.   CLINICAL FACTORS:   Depression:    Hopelessness Previous Psychiatric Diagnoses and Treatments Medical Diagnoses and Treatments/Surgeries   Musculoskeletal: Strength & Muscle Tone: within normal limits Gait & Station: normal Patient leans: N/A  Psychiatric Specialty Exam: Physical Exam  Nursing note and vitals reviewed.   Review of Systems  Psychiatric/Behavioral: Positive for depression and suicidal ideas.  All other systems reviewed and are negative.   Blood pressure (!) 85/65, pulse 79, temperature 98 F (36.7 C), temperature source Oral, resp. rate 18, height 5\' 4"  (1.626 m), weight 46.3 kg (102 lb), SpO2 100 %.Body mass index is 17.51 kg/m.              Please see H&P.                                             COGNITIVE FEATURES THAT CONTRIBUTE TO RISK:  Closed-mindedness, Polarized thinking and Thought constriction (tunnel vision)    SUICIDE RISK:   Severe:  Frequent, intense, and enduring suicidal ideation, specific plan, no subjective intent, but some objective markers of intent (i.e., choice of lethal method), the method is accessible, some limited preparatory behavior, evidence of impaired self-control, severe dysphoria/symptomatology, multiple risk factors present, and few if any protective factors, particularly a lack of social support.   PLAN OF CARE:Please see H&P.   I certify that inpatient services furnished can reasonably be expected to improve the patient's condition.  Alberta Lenhard, MD 04/25/2016, 11:49 AM

## 2016-04-25 NOTE — [facility] Group Notes (Signed)
[facility] Group Notes:  (Nursing/MHT/Case Management/Adjunct)  Date:  04/25/2016  Time:  0900  Type of Therapy:  Nurse Education  Participation Level:  Active  Participation Quality:  Attentive  Affect:  Blunted and Depressed  Cognitive:  Alert  Insight:  Improving  Engagement in Group:  Engaged  Modes of Intervention:  Discussion, Education and Support  Summary of Progress/Problems: Patient attended group and participated appropriately. Stated her goal for recovery was to establish reliable transportation to ensure she makes her appointments.  Merian Capron [facility] 04/25/2016, 4:26 PM

## 2016-04-25 NOTE — Progress Notes (Signed)
PT Note  Patient Details Name: Tara Brown MRN: [id number] DOB: 1961/04/28        Chart reviewed. Interviewed patient and at this time her greatest difficulty is with her RA in her hands and UEs. She has been seen by our OT staff in previous visits and we have set her up with adaptive equipment for home use as well. Recommend OT consult with her while she is here to see how she is doing and if there are any additional modifications or adaptations they can recommend.   Spoke with nurse about recommendation for OT order, and she will help with this.   Signing off as PT.  Thanks  Marella Bile 04/25/2016, 3:59 PM  Marella Bile, PT Pager: [phone] 04/25/2016
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
## 2016-04-25 NOTE — Progress Notes (Signed)
Patient ID: Tara Brown, female   DOB: 13-Feb-1961, 55 y.o.   MRN: [id number] D: Patient's main complaint today is pain.  She reports ongoing depression and some thoughts of self harm.  Patient has chronic arthritis and needs assistance on some ADLs.  She has been sitting in the day room with minimal interaction with her peers.  She is taking tramadol for her pain.  PT eval was ordered STAT.  Patient rates her depression and hopelessness as a 6; anxiety as a 5.  Patient has been receptive to a female peer who has helped her with her bed linens.  She denies HI/AVH. Patient's BP is running low today.  Her lisinopril has been held.  Encouraging patient to drink fluids and ambulate periodically.   A: Continue to monitor medication management and MD orders.  Safety checks completed every 15 minutes per protocol.  Offer support and encouragement as needed. R: Patient has minimal interaction with peers and staff.

## 2016-04-25 NOTE — Progress Notes (Signed)
Adult Psychoeducational Group Note  Date:  04/25/2016 Time:  8:20 PM  Group Topic/Focus:  Wrap-Up Group:   The focus of this group is to help patients review their daily goal of treatment and discuss progress on daily workbooks.   Participation Level:  Active  Participation Quality:  Appropriate  Affect:  Appropriate  Cognitive:  Appropriate  Insight: Appropriate  Engagement in Group:  Engaged  Modes of Intervention:  Discussion  Additional Comments:  Pt rated her overall day a 5 out of 10 because she achieved her goals for the day, which were to take a shower and talk to her dad on the phone.    Cleotilde Neer 04/25/2016, 8:54 PM

## 2016-04-25 NOTE — [facility] Group Notes (Signed)
[facility] LCSW Group Therapy 04/25/2016 1:15 PM  Type of Therapy: Group Therapy- Feelings about Diagnosis  Participation Level: Reserved  Participation Quality:  Appropriate  Affect:  Appropriate  Cognitive: Alert and Oriented   Insight:  Developing   Engagement in Therapy: Developing/Improving and Engaged   Modes of Intervention: Clarification, Confrontation, Discussion, Education, Exploration, Limit-setting, Orientation, Problem-solving, Rapport Building, Dance movement psychotherapist, Socialization and Support  Description of Group:   This group will allow patients to explore their thoughts and feelings about diagnoses they have received. Patients will be guided to explore their level of understanding and acceptance of these diagnoses. Facilitator will encourage patients to process their thoughts and feelings about the reactions of others to their diagnosis, and will guide patients in identifying ways to discuss their diagnosis with significant others in their lives. This group will be process-oriented, with patients participating in exploration of their own experiences as well as giving and receiving support and challenge from other group members.  Summary of Progress/Problems:  Pt participated minimally in group but was able to identify characteristics that she values about herself and personal identity.  Therapeutic Modalities:   Cognitive Behavioral Therapy Solution Focused Therapy Motivational Interviewing Relapse Prevention Therapy  Chad Cordial, LCSWA 04/25/2016 4:18 PM

## 2016-04-25 NOTE — Progress Notes (Signed)
NUTRITION ASSESSMENT  Pt identified as at risk on the Malnutrition Screen Tool  INTERVENTION: 1. Educated patient on the importance of nutrition and encouraged intake of food and beverages. 2. Discussed weight goals. 3. Supplements: continue Ensure Enlive po BID, each supplement provides 350 kcal and 20 grams of protein   NUTRITION DIAGNOSIS: Unintentional weight loss related to sub-optimal intake as evidenced by pt report.   Goal: Pt to meet >/= 90% of their estimated nutrition needs.  Monitor:  PO intake  Assessment:  Pt admitted for depression with overdose for attempted suicide. Per chart review, pt gained 5 lbs from 09/11/15-12/05/15 and subsequently lost 9 lbs (8%) since that time. This is not significant for the 5 month time frame.  55 y.o. female  Height: Ht Readings from Last 1 Encounters:  04/24/16 5\' 4"  (1.626 m)    Weight: Wt Readings from Last 1 Encounters:  04/24/16 102 lb (46.3 kg)    Weight Hx: Wt Readings from Last 10 Encounters:  04/24/16 102 lb (46.3 kg)  12/05/15 111 lb (50.3 kg)  09/11/15 106 lb (48.1 kg)  09/07/15 102 lb (46.3 kg)  09/02/15 102 lb (46.3 kg)  08/31/15 101 lb (45.8 kg)  08/09/15 104 lb (47.2 kg)  08/07/15 101 lb 4.8 oz (45.9 kg)  03/26/15 119 lb (54 kg)  03/26/15 125 lb (56.7 kg)    BMI:  Body mass index is 17.51 kg/m. Pt meets criteria for underweight based on current BMI.  Estimated Nutritional Needs: Kcal: 25-30 kcal/kg Protein: > 1 gram protein/kg Fluid: 1 ml/kcal  Diet Order: Diet regular Room service appropriate? Yes; Fluid consistency: Thin Pt is also offered choice of unit snacks mid-morning and mid-afternoon.  Pt is eating as desired.   Lab results and medications reviewed.     03/28/15, MS, RD, LDN Inpatient Clinical Dietitian Pager # [phone] After hours/weekend pager # [phone]

## 2016-04-25 NOTE — Tx Team (Signed)
Interdisciplinary Treatment Plan Update (Adult) Date: 04/25/2016   Date: 04/25/2016 9:05 AM  Progress in Treatment:  Attending groups: Pt is new to milieu, continuing to assess  Participating in groups: Pt is new to milieu, continuing to assess  Taking medication as prescribed: Yes  Tolerating medication: Yes  Family/Significant othe contact made: No, CSW assessing for appropriate contact Patient understands diagnosis: Yes AEB seeking help with depression Discussing patient identified problems/goals with staff: Yes  Medical problems stabilized or resolved: Yes  Denies suicidal/homicidal ideation: No, endorsing SI Patient has not harmed self or Others: Yes   New problem(s) identified: None identified at this time.   Discharge Plan or Barriers: CSW will assess for appropriate discharge plan and relevant barriers.   Additional comments:  Patient and CSW reviewed pt's identified goals and treatment plan. Patient verbalized understanding and agreed to treatment plan.   Reason for Continuation of Hospitalization:  Depression Medication stabilization Suicidal ideation  Estimated length of stay: 3-5 days  Review of initial/current patient goals per problem list:   1.  Goal(s): Patient will participate in aftercare plan  Met:  No  Target date: 3-5 days from date of admission   As evidenced by: Patient will participate within aftercare plan AEB aftercare provider and housing plan at discharge being identified.  04/25/16: CSW to work with Pt to assess for appropriate discharge plan and faciliate appointments and referrals as needed prior to d/c.  2.  Goal (s): Patient will exhibit decreased depressive symptoms and suicidal ideations.  Met:  No  Target date: 3-5 days from date of admission   As evidenced by: Patient will utilize self rating of depression at 3 or below and demonstrate decreased signs of depression or be deemed stable for discharge by MD. 04/25/16: Pt was admitted with  symptoms of depression, rating 10/10. Pt continues to present with flat affect and depressive symptoms.  Pt will demonstrate decreased symptoms of depression and rate depression at 3/10 or lower prior to discharge.  Attendees:  Patient:    Family:    Physician: Dr. Parke Poisson, MD  04/25/2016 9:05 AM  Nursing: Mayra Neer, RN; Loletta Specter, RN 04/25/2016 9:05 AM  Clinical Social Worker Peri Maris, [place] 04/25/2016 9:05 AM  Other: Erasmo Downer Drinkard, LCSWA 04/25/2016 9:05 AM  Clinical: Lars Pinks, RN Case manager  04/25/2016 9:05 AM  Other:  04/25/2016 9:05 AM  Other:     Peri Maris, [place] Social Work [phone]

## 2016-04-25 NOTE — Progress Notes (Addendum)
Patient ID: Tara Brown, female   DOB: 06/28/1961, 55 y.o.   MRN: [id number] Held 40 mg lisinopril due low BP 85/65.  Encourage fluids and report any dizziness or lightheadedness.  NP aware.

## 2016-04-26 LAB — BASIC METABOLIC PANEL
ANION GAP: 8 (ref 5–15)
BUN: 23 mg/dL — AB (ref 6–20)
CHLORIDE: 100 mmol/L — AB (ref 101–111)
CO2: 30 mmol/L (ref 22–32)
Calcium: 9.7 mg/dL (ref 8.9–10.3)
Creatinine, Ser: 1.14 mg/dL — ABNORMAL HIGH (ref 0.44–1.00)
GFR, EST NON AFRICAN AMERICAN: 53 mL/min — AB (ref >60)
Glucose, Bld: 106 mg/dL — ABNORMAL HIGH (ref 65–99)
POTASSIUM: 4.1 mmol/L (ref 3.5–5.1)
SODIUM: 138 mmol/L (ref 135–145)

## 2016-04-26 LAB — TSH: TSH: 0.972 u[IU]/mL (ref 0.350–4.500)

## 2016-04-26 LAB — FOLATE: FOLATE: 24.4 ng/mL (ref >5.9)

## 2016-04-26 LAB — VITAMIN B12: VITAMIN B 12: 256 pg/mL (ref 180–914)

## 2016-04-26 LAB — IRON AND TIBC
Iron: 25 ug/dL — ABNORMAL LOW (ref 28–170)
SATURATION RATIOS: 12 % (ref 10.4–31.8)
TIBC: 217 ug/dL — ABNORMAL LOW (ref 250–450)
UIBC: 192 ug/dL

## 2016-04-26 LAB — FERRITIN: Ferritin: 103 ng/mL (ref 11–307)

## 2016-04-26 MED ORDER — NAPROXEN 375 MG PO TABS
375.0000 mg | ORAL_TABLET | Freq: Two times a day (BID) | ORAL | Status: DC | PRN
  Administered 2016-04-27 – 2016-05-13 (×3): 375 mg via ORAL
  Filled 2016-04-26 (×2): qty 1

## 2016-04-26 MED ORDER — SERTRALINE HCL 25 MG PO TABS
25.0000 mg | ORAL_TABLET | Freq: Every day | ORAL | Status: DC
  Administered 2016-04-27: 25 mg via ORAL
  Filled 2016-04-26 (×2): qty 1

## 2016-04-26 MED ORDER — FERROUS SULFATE 325 (65 FE) MG PO TABS
325.0000 mg | ORAL_TABLET | Freq: Two times a day (BID) | ORAL | Status: DC
  Administered 2016-04-26 – 2016-05-15 (×38): 325 mg via ORAL
  Filled 2016-04-26 (×2): qty 1
  Filled 2016-04-26: qty 14
  Filled 2016-04-26 (×26): qty 1
  Filled 2016-04-26: qty 14
  Filled 2016-04-26 (×15): qty 1

## 2016-04-26 MED ORDER — HYDRALAZINE HCL 10 MG PO TABS: 10.0000 mg | ORAL_TABLET | Freq: Three times a day (TID) | ORAL | Status: DC | PRN

## 2016-04-26 NOTE — Progress Notes (Signed)
Adult Psychoeducational Group Note  Date:  04/26/2016 Time:  10:44 PM  Group Topic/Focus:  Wrap-Up Group:   The focus of this group is to help patients review their daily goal of treatment and discuss progress on daily workbooks.   Participation Level:  Active  Participation Quality:  Appropriate  Affect:  Appropriate  Cognitive:  Alert  Insight: Appropriate  Engagement in Group:  Engaged  Modes of Intervention:  Discussion  Additional Comments:  Patient states "I had an average day". Patient wants to meet with Dr. Jama Flavors tomorrow. Emely Fahy L Kyonna Frier 04/26/2016, 10:44 PM

## 2016-04-26 NOTE — Progress Notes (Signed)
D: Pt presents with flat affect and depressed mood. Pt rates depression 5/10. Anxiety 5/10. Hopelessness 5/10. Pt denies suicidal thoughts. Pt verbalized concerns about having her pain meds switched to something stronger. Pt stressor is having to deal with the constant pain from rheumatoid arthritis. Pt focused today is getting pain meds adjusted. Pt verbalized that she made MD aware of her request. Pt informed of med changes per MD.  A: Medications reviewed with pt. Medications administered as ordered per MD. Verbal support provided. Pt encouraged to attend groups. 15 minute checks performed for safety. R: Pt receptive to tx.

## 2016-04-26 NOTE — Evaluation (Signed)
Occupational Therapy Evaluation Patient Details Name: Tara Brown MRN: [id number] DOB: 1961-01-21 Today's Date: 04/26/2016    History of Present Illness 55 year old female admitted to [facility] for major depression.  PMH significant for RA, L great toe amputation, and back pain   Clinical Impression   Pt was admitted for the above.  I had seen her at [facility] for a previous admission.  Pt has a lot of ROM limitations due to RA, but there are still some things she cannot do, or cannot do without great difficulty.  In this setting, she does not have AE so needs some help from staff.  Tried to help her problem solve ways to make things easier/make her more independent with functioning at home.  I do not have any further aides available to give her at this time.      Follow Up Recommendations  Supervision - Intermittent (if pt has another service, could benefit from HHOT--but this is not a stand-alone service)    Equipment Recommendations  None recommended by OT    Recommendations for Other Services       Precautions / Restrictions Precautions Precautions: Fall Restrictions Weight Bearing Restrictions: No      Mobility Bed Mobility Overal bed mobility: Independent                Transfers                      Balance                                            ADL Overall ADL's : Needs assistance/impaired                                       General ADL Comments: pt needs overall min Amod A in this setting as she does not have AE with her.  Discussed possible modifications for home including trying to pull shirt down with backscratcher.  Also suggested using a large light plastic bowl while seated to scoop up heavier items that fall.  She can use reacher or dressing stick to push item near chair.  Recommended that she carry cordless phone in hip purse or fanny pack as she needs EMS to help get her up. Pt may have BPPV. She spins when  she leans way over.  Her neck is very limited with ROM so that I don't think a maneuver would be effective.  Encouraged her not to lean over where she gets dizzy if she can help it.  I.e.  Look where item is and try to scoop it up without looking. She doesn't wear socks at home as she cannot get these on     Vision     Perception     Praxis      Pertinent Vitals/Pain Pain Assessment: Faces Faces Pain Scale: Hurts even more Pain Location: back Pain Descriptors / Indicators: Aching Pain Intervention(s): Limited activity within patient's tolerance;Monitored during session;Repositioned (pt repositioned as needed)     Hand Dominance Right   Extremity/Trunk Assessment Upper Extremity Assessment Upper Extremity Assessment: RUE deficits/detail;LUE deficits/detail RUE Deficits / Details: pt has bil ROM limitations due to RA.  Wrists are positioned inward and pt can lift to 60-70 degrees FF.  Has tip  pinch on R and can pinch to 4th and 5th digits with L.  Pt is R dominant LUE Deficits / Details: see RUE           Communication Communication Communication: No difficulties   Cognition Arousal/Alertness: Awake/alert Behavior During Therapy: WFL for tasks assessed/performed Overall Cognitive Status: Within Functional Limits for tasks assessed                     General Comments       Exercises       Shoulder Instructions      Home Living Family/patient expects to be discharged to:: Private residence Living Arrangements: Alone Available Help at Discharge: Friend(s)               Bathroom Shower/Tub: Chief Strategy Officer: Standard     Home Equipment: Cane - single point;Bedside commode;Shower seat          Prior Functioning/Environment Level of Independence: Needs assistance        Comments: pt has great difficulty with several tasks and has done as much modifying as possible. She is able to wash her hair with long sponge/net and shoehorn.   She is having difficulty picking heavier things up that fall.  She also has trouble getting shirts pulled down in back due to limited range.  She has been using wall but now scapula hurt.    OT Diagnosis: Generalized weakness;Acute pain   OT Problem List:     OT Treatment/Interventions:      OT Goals(Current goals can be found in the care plan section) Acute Rehab OT Goals Patient Stated Goal: be able to wash hair, put shirt on more easily without hurting back and pick up heavier items that fall OT Goal Formulation: All assessment and education complete, DC therapy  OT Frequency:     Barriers to D/C:            Co-evaluation              End of Session Nurse Communication:  (requested disposable heat packs for scapula if MD approves and 3:1 over toilet here as she cannot use grab bar)  Activity Tolerance: Patient tolerated treatment well Patient left: in bed;with call bell/phone within reach   Time: 1055-1130 OT Time Calculation (min): 35 min Charges:  OT General Charges $OT Visit: 1 Procedure OT Evaluation $OT Eval Moderate Complexity: 1 Procedure G-Codes: OT G-codes **NOT FOR INPATIENT CLASS** Functional Assessment Tool Used: clinical judgment Functional Limitation: Self care Self Care Current Status (Z3007): At least 20 percent but less than 40 percent impaired, limited or restricted Self Care Goal Status (M2263): At least 20 percent but less than 40 percent impaired, limited or restricted Self Care Discharge Status [phone]): At least 20 percent but less than 40 percent impaired, limited or restricted  [facility] 04/26/2016, 12:34 PM

## 2016-04-26 NOTE — BHH Group Notes (Signed)
BHH LCSW Group Therapy 04/26/2016 1:15 PM  Type of Therapy: Group Therapy- Emotion Regulation  Participation Level: Active   Participation Quality:  Appropriate  Affect: Appropriate  Cognitive: Alert and Oriented   Insight:  Developing/Improving  Engagement in Therapy: Developing/Improving and Engaged   Modes of Intervention: Clarification, Confrontation, Discussion, Education, Exploration, Limit-setting, Orientation, Problem-solving, Rapport Building, Dance movement psychotherapist, Socialization and Support  Summary of Progress/Problems: The topic for group today was emotional regulation. This group focused on both positive and negative emotion identification and allowed group members to process ways to identify feelings, regulate negative emotions, and find healthy ways to manage internal/external emotions. Group members were asked to reflect on a time when their reaction to an emotion led to a negative outcome and explored how alternative responses using emotion regulation would have benefited them. Group members were also asked to discuss a time when emotion regulation was utilized when a negative emotion was experienced. Pt identified anger is difficult to manage at times due to her inability to do simple ADLs at home.    Chad Cordial, LCSWA 04/26/2016 5:03 PM

## 2016-04-26 NOTE — Progress Notes (Signed)
Recreation Therapy Notes  Date: 04/26/16 Time: 0930 Location: [address] Group Room  Group Topic: Stress Management  Goal Area(s) Addresses:  Patient will verbalize importance of using healthy stress management.  Patient will identify positive emotions associated with healthy stress management.   Intervention: Stress Management   Activity :  Progressive Muscle Relaxation.  LRT introduced the technique of progressive muscle relaxation to patients.  Patients were to follow along with LRT as script was read to participate in activity.  Education:  Stress Management, Discharge Planning.   Education Outcome: Needs additional education  Clinical Observations/Feedback: Pt did not attend group.   Sunset Joshi, LRT/CTRS         Tara Brown A 04/26/2016 3:14 PM 

## 2016-04-26 NOTE — Progress Notes (Signed)
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   Patient seen and chart reviewed.Discussed patient with treatment team.   Patient today seen as less depressed , is tolerating medications well. Pt continues to be focussed on her pain and ruminates about her disability which impacts the quality of her daily living. Pt per staff - has been seen in groups and is participating in milieu . Continues to need encouragement and support.     Principal Problem: Major depressive disorder, recurrent severe without psychotic features ([place]) Diagnosis:   Patient Active Problem List   Diagnosis Date Noted  . Back pain [M54.9] 04/25/2016  . Major depressive disorder, recurrent severe without psychotic features ([place]) [F33.2] 04/24/2016  . Left foot infection [L08.9] 12/06/2015  . Suicidal ideations [R45.851] 12/06/2015  . Rheumatoid arthritis ([place]) [M06.9] 12/06/2015  . Foot infection [L08.9] 12/05/2015  . Cellulitis of toe, left [L03.032]   . Chronic osteomyelitis ([place]) [M86.60] 09/07/2015  . Anemia, iron deficiency [D50.9]   . Essential hypertension [I10]   . Anemia of chronic disease [D63.8] 03/08/2015   Total Time spent with patient: 30 minutes  Past Psychiatric History: Please see H&P.   Past Medical History:  Past Medical History:  Diagnosis Date  . Arthritis   . Major depressive disorder, recurrent, severe without psychotic features ([place]) 03/11/2015    Past Surgical History:  Procedure Laterality Date  . AMPUTATION Left 12/07/2015   Procedure: AMPUTATION LEFT GREAT TOE;  Surgeon: Newt Minion, MD;   Location: [facility]S;  Service: Orthopedics;  Laterality: Left;  . FOOT SURGERY    . HAND SURGERY    . HIP FRACTURE SURGERY    . KNEE SURGERY    . NECK SURGERY     Family History:  Family History  Problem Relation Age of Onset  . Bipolar disorder Sister   . Suicidality Other   . Schizophrenia Other   . Stroke Neg Hx    Family Psychiatric  History:Please see H&P.  Social History:  History  Alcohol Use  . Yes    Comment: Rarely     History  Drug Use No    Social History   Social History  . Marital status: Divorced    Spouse name: N/A  . Number of children: N/A  . Years of education: N/A   Social History Main Topics  . Smoking status: Never Smoker  . Smokeless tobacco: Never Used  . Alcohol use Yes     Comment: Rarely  . Drug use: No  . Sexual activity: No   Other Topics Concern  . None   Social History Narrative  . None   Additional Social History:                         Sleep: Fair  Appetite:  Fair  Current Medications: Current Facility-Administered Medications  Medication Dose Route Frequency Provider Last Rate Last Dose  . alum & mag hydroxide-simeth (MAALOX/MYLANTA) 200-200-20 MG/5ML suspension 30 mL  30 mL Oral Q4H PRN Patrecia Pour, NP      .  calcium-vitamin D (OSCAL WITH D) 500-200 MG-UNIT per tablet 1 tablet  1 tablet Oral BID Patrecia Pour, NP   1 tablet at 04/26/16 0801  . DULoxetine (CYMBALTA) DR capsule 30 mg  30 mg Oral Daily Ursula Alert, MD   30 mg at 04/26/16 0801  . feeding supplement (ENSURE ENLIVE) (ENSURE ENLIVE) liquid 237 mL  237 mL Oral BID BM Myer Peer Cobos, MD   237 mL at 98/11/91 4782  . folic acid (FOLVITE) tablet 1 mg  1 mg Oral Daily Patrecia Pour, NP   1 mg at 04/26/16 0801  . lidocaine (LIDODERM) 5 % 2 patch  2 patch Transdermal Q24H Ursula Alert, MD   2 patch at 04/26/16 1309  . lisinopril (PRINIVIL,ZESTRIL) tablet 40 mg  40 mg Oral Daily Patrecia Pour, NP   40 mg at 04/26/16 0801  . magnesium hydroxide  (MILK OF MAGNESIA) suspension 30 mL  30 mL Oral Daily PRN Patrecia Pour, NP      . naproxen (NAPROSYN) tablet 375 mg  375 mg Oral BID PRN Ursula Alert, MD      . predniSONE (DELTASONE) tablet 10 mg  10 mg Oral Daily Patrecia Pour, NP   10 mg at 04/26/16 0801  . sertraline (ZOLOFT) tablet 50 mg  50 mg Oral Daily Ursula Alert, MD   50 mg at 04/26/16 0801  . traMADol (ULTRAM) tablet 100 mg  100 mg Oral TID WC & HS Patrecia Pour, NP   100 mg at 04/26/16 1309  . traZODone (DESYREL) tablet 100 mg  100 mg Oral QHS Ursula Alert, MD   100 mg at 04/25/16 2123    Lab Results:  Results for orders placed or performed during the hospital encounter of 04/24/16 (from the past 48 hour(s))  Vitamin B12     Status: None   Collection Time: 04/26/16  6:51 AM  Result Value Ref Range   Vitamin B-12 256 180 - 914 pg/mL    Comment: (NOTE) This assay is not validated for testing neonatal or myeloproliferative syndrome specimens for Vitamin B12 levels. Performed at [facility]   Folate     Status: None   Collection Time: 04/26/16  6:51 AM  Result Value Ref Range   Folate 24.4 >5.9 ng/mL    Comment: Performed at [facility]  Iron and TIBC     Status: Abnormal   Collection Time: 04/26/16  6:51 AM  Result Value Ref Range   Iron 25 (L) 28 - 170 ug/dL   TIBC 217 (L) 250 - 450 ug/dL   Saturation Ratios 12 10.4 - 31.8 %   UIBC 192 ug/dL    Comment: Performed at [facility]  Ferritin     Status: None   Collection Time: 04/26/16  6:51 AM  Result Value Ref Range   Ferritin 103 11 - 307 ng/mL    Comment: Performed at [facility]  TSH     Status: None   Collection Time: 04/26/16  6:51 AM  Result Value Ref Range   TSH 0.972 0.350 - 4.500 uIU/mL    Comment: Performed at [facility] metabolic panel     Status: Abnormal   Collection Time: 04/26/16  6:51 AM  Result Value Ref Range   Sodium 138 135 - 145 mmol/L   Potassium 4.1 3.5 - 5.1 mmol/L    Chloride 100 (L) 101 - 111 mmol/L   CO2 30 22 - 32  mmol/L   Glucose, Bld 106 (H) 65 - 99 mg/dL   BUN 23 (H) 6 - 20 mg/dL   Creatinine, Ser 1.14 (H) 0.44 - 1.00 mg/dL   Calcium 9.7 8.9 - 10.3 mg/dL   GFR calc non Af Amer 53 (L) >60 mL/min   GFR calc Af Amer >60 >60 mL/min    Comment: (NOTE) The eGFR has been calculated using the CKD EPI equation. This calculation has not been validated in all clinical situations. eGFR's persistently <60 mL/min signify possible Chronic Kidney Disease.    Anion gap 8 5 - 15    Comment: Performed at [facility]    Blood Alcohol level:  Lab Results  Component Value Date   [facility] <5 04/23/2016   ETH <5 59/29/2446    Metabolic Disorder Labs: No results found for: HGBA1C, MPG No results found for: PROLACTIN No results found for: CHOL, TRIG, HDL, CHOLHDL, VLDL, LDLCALC  Physical Findings: AIMS: Facial and Oral Movements Muscles of Facial Expression: None, normal Lips and Perioral Area: None, normal Jaw: None, normal Tongue: None, normal,Extremity Movements Upper (arms, wrists, hands, fingers): None, normal Lower (legs, knees, ankles, toes): None, normal, Trunk Movements Neck, shoulders, hips: None, normal, Overall Severity Severity of abnormal movements (highest score from questions above): None, normal Incapacitation due to abnormal movements: None, normal Patient's awareness of abnormal movements (rate only patient's report): No Awareness, Dental Status Current problems with teeth and/or dentures?: No Does patient usually wear dentures?: No  CIWA:    COWS:     Musculoskeletal: Strength & Muscle Tone: within normal limits Gait & Station: normal Patient leans: N/A  Psychiatric Specialty Exam: Physical Exam  Nursing note and vitals reviewed.   Review of Systems  Musculoskeletal: Positive for back pain, joint pain and neck pain.  Psychiatric/Behavioral: Positive for depression. The patient is nervous/anxious.   All  other systems reviewed and are negative.   Blood pressure (!) 116/91, pulse 80, temperature 98 F (36.7 C), temperature source Oral, resp. rate 16, height 5' 4"  (1.626 m), weight 46.3 kg (102 lb), SpO2 100 %.Body mass index is 17.51 kg/m.  General Appearance: Fairly Groomed  Eye Contact:  Fair  Speech:  Normal Rate  Volume:  Normal  Mood:  Anxious and Depressed  Affect:  Congruent  Thought Process:  Goal Directed and Descriptions of Associations: Intact  Orientation:  Full (Time, Place, and Person)  Thought Content:  Logical  Suicidal Thoughts:  NoS/P suicide attempt.  Homicidal Thoughts:  No  Memory:  Immediate;   Fair Recent;   Fair Remote;   Fair  Judgement:  Impaired  Insight:  Shallow  Psychomotor Activity:  Normal  Concentration:  Concentration: Fair and Attention Span: Fair  Recall:  [employer] of Knowledge:  Fair  Language:  Fair  Akathisia:  No  Handed:  Right  AIMS (if indicated):     Assets:  Desire for Improvement  ADL's:  Needs help  Cognition:  WNL  Sleep:  Number of Hours: 6.5     Treatment Plan Summary:Iyani is a 55 year old  Caucasian female who is divorced , on SSD , who lives in [place], who has a hx of depression, as well as disabling medical condition of rheumatoid arthritis , 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 Pt today seen as less depressed, continues to be focussed on pain, will continue treatment. Daily contact with patient to assess and evaluate symptoms  and progress in treatment and Medication management Will continue tapering off Zoloft for lack of efficacy. Started Cymbalta 30 mg po daily for affective sx. Will continue Trazodone , increased to 100 mg po qhs for sleep. Provided pain management for her chronic pain. Restarted home medications where indicated. Will continue to monitor vitals ,medication compliance and treatment side effects while patient is here.  Will monitor for  medical issues as well as call consult as needed.  Reviewed labs Na+ - low- repeat - wnl , hb/hct -low- likely chronic anemia due to her chronic medical condition  , uds - negative ,iron - low - will replace with Feso4 325 mg po bid with meals. Creatinine continues to be going up - Sheila May NP to consult hospitalist. PT consult placed - please notes in EHR. CSW will continue  working on disposition. Will need referral to home health aid versus ALF. Patient to participate in therapeutic milieu .     Ildefonso Keaney, MD 04/26/2016, 3:12 PM

## 2016-04-26 NOTE — Progress Notes (Signed)
D    Pt is sad and depressed but brightens on approach   Pt is drinking ensure and eating snacks   She has contractures bilateral hands and arms but does well tending to herself   She does need minimal assistance    She is appropriate in her behaviors and interacts minimally but appropriately with others    A    Verbal support given    Medications administered and effectiveness monitored   Provide necessary assist but allow pt freedom to help herself when she is able    Q 15 min checks R   Pt is safe at present and receptive to verbal support 

## 2016-04-26 NOTE — Progress Notes (Signed)
Spoke with Dr Konrad Dolores hospitalist, regarding patient's Creatinine level trending up.  8/16/217 (1.14) 04/23/16 (1.05) Per recommendations push fluids and monitor Cr level.  Patient was admitted to [facility] for overdose and possibly becoming dehydrated from that.  Held Lisinopril due to elevated CR and ordered Hydralazine PRN for HTN.  Next BMP ordered 04/28/16.

## 2016-04-27 DIAGNOSIS — F332 Major depressive disorder, recurrent severe without psychotic features: Principal | ICD-10-CM

## 2016-04-27 LAB — BASIC METABOLIC PANEL
ANION GAP: 8 (ref 5–15)
BUN: 19 mg/dL (ref 6–20)
CALCIUM: 8.7 mg/dL — AB (ref 8.9–10.3)
CO2: 29 mmol/L (ref 22–32)
Chloride: 101 mmol/L (ref 101–111)
Creatinine, Ser: 1.06 mg/dL — ABNORMAL HIGH (ref 0.44–1.00)
GFR calc Af Amer: >60 mL/min (ref >60)
GFR, EST NON AFRICAN AMERICAN: 58 mL/min — AB (ref >60)
GLUCOSE: 131 mg/dL — AB (ref 65–99)
Potassium: 4.5 mmol/L (ref 3.5–5.1)
SODIUM: 138 mmol/L (ref 135–145)

## 2016-04-27 LAB — VITAMIN D 25 HYDROXY (VIT D DEFICIENCY, FRACTURES): VIT D 25 HYDROXY: 32.1 ng/mL (ref 30.0–100.0)

## 2016-04-27 MED ORDER — TRAMADOL HCL 50 MG PO TABS
100.0000 mg | ORAL_TABLET | Freq: Four times a day (QID) | ORAL | Status: DC | PRN
  Administered 2016-04-27 – 2016-04-28 (×3): 100 mg via ORAL
  Filled 2016-04-27 (×3): qty 2

## 2016-04-27 MED ORDER — TRAZODONE HCL 100 MG PO TABS
100.0000 mg | ORAL_TABLET | Freq: Every evening | ORAL | Status: DC | PRN
  Administered 2016-04-27 – 2016-04-28 (×2): 100 mg via ORAL
  Filled 2016-04-27 (×2): qty 1

## 2016-04-27 NOTE — Progress Notes (Signed)
Agree with current plan. Increased hydration and holding ACEI. Will assess BMP next am and plan on rounding on patient tomorrow should the patient require this.  Darling Cieslewicz, [employer]

## 2016-04-27 NOTE — Progress Notes (Signed)
OT Note:  Came over and reviewed use of light plastic bowl to retrieve items from floor when she does not have enough strength to use reacher; reminded her to use peripheral vision and not tip head down due to spinning.  Also gave her light shoulder bag to carry cordless phone with her.  Placed 3:1 over her toilet.  Her toilet is elongated, but this should work.  Talked to both RN and pt about removing if problematic.  Will bring rear tips before I leave work today.  [place], OTR/L [phone] 04/27/2016

## 2016-04-27 NOTE — Progress Notes (Signed)
Pt out on unit in dayroom at shift change.  Pt ambulating well despite foot contractions r/t rheumatoid arthritis.  Pt. Complains of back pain.  Pt encouraged to drink more fluids d/t BP and Cr. Levels.  Pt denies SI, HI and AVH. Pt contracts for safety. Pt given scheduled med for back pain.  Pt drinks water.  Pt remains safe on unit.

## 2016-04-27 NOTE — Progress Notes (Signed)
D: Pt presents with flat affect and depressed mood. Pt rates depression 5/10. Anxiety 5/10. Hopeless 5/10. Pt denies suicidal thoughts. Pt main stressor is getting her chronic pain under control. Pt continues to feel like she is a burden to her family due to her illness of rheumatoid arthritis. Pt requesting to have her sister bathe her this evening during visitation. Writer offered to have a MHT assist pt with bathing but pt declined.  A: Medications reviewed with pt. Medications administered as ordered per MD. Verbal support provided. Pt encouraged to attend groups. 15 minute checks performed for safety.  R: Pt receptive to tx.

## 2016-04-27 NOTE — Progress Notes (Signed)
OT brought in a bowl and pouch purse for pt to take home. Items placed in pt locker by Jannifer Hick, MHT.

## 2016-04-27 NOTE — [facility] Group Notes (Signed)
[facility] Group Therapy 04/27/2016 1:15pm  Type of Therapy: [facility] Presentation  Participation Level: Active  Participation Quality: Attentive  Affect: Appropriate  Cognitive: Oriented  Insight: Developing/Improving  Engagement in Therapy: Engaged  Modes of Intervention: Discussion, Education and Socialization  Summary of Progress/Problems: [facility] [employer] ([facility]) Speaker came to talk about his personal journey with substance abuse and addiction. The pt processed ways by which to relate to the speaker. MHA speaker provided handouts and educational information pertaining to groups and services offered by the MHA. Pt was engaged in speaker's presentation and was receptive to resources provided.    Avira Tillison Carter, LCSWA 04/27/2016 1:41 PM  

## 2016-04-27 NOTE — Progress Notes (Signed)
[facility] MD Progress Note  04/27/2016 2:17 PM Tara Brown  MRN:  [id number] Subjective: Patient reports that her mood has improved partially, and at this time she denies any active suicidal ideations . She states, however, that her depression is " situational more than anything else " and that therefore her expectation is that she is going to continue feeling depressed, particularly as she continues to gradually develop worsening functional limitations due to her chronic medical illness. At this time denies medication side effects.  Objective: I have met with patient and have reviewed chart notes. Patient is a 55 year old female, known to our unit from a prior psychiatric admission . She presented for depression and a suicide attempt by overdosing on Ambien. She has a history of severe, chronic Rheumatoid Arthritis, which has caused gradual but progressive loss of her ability to function independently. Patient states her range of motion has decreased further, making it difficult for her to function in activities she used to be able to do without difficulty, such as driving . She states " I have been trying to hold on to my independence as long as I can, but now I think I need to go to an assisted living place ".  She states that due to insurance constraints , not having medicaid , " it has been very hard " ( referring to placement efforts ) . At this time reports ongoing depression, due to above, denies suicidal ideations at this time , and expresses a sense of regret about her suicidal attempt, stating " I know how my family would suffer if I killed myself, so I do not want to do it ". Patient visible on the unit, going to some groups . No disruptive behaviors on unit. States sleep improving, appetite improving . Denies medication side effects.       Principal Problem: Major depressive disorder, recurrent severe without psychotic features ([place]) Diagnosis:   Patient Active Problem List   Diagnosis Date Noted  . Back pain [M54.9] 04/25/2016  . Major depressive disorder, recurrent severe without psychotic features ([place]) [F33.2] 04/24/2016  . Left foot infection [L08.9] 12/06/2015  . Suicidal ideations [R45.851] 12/06/2015  . Rheumatoid arthritis ([place]) [M06.9] 12/06/2015  . Foot infection [L08.9] 12/05/2015  . Cellulitis of toe, left [L03.032]   . Chronic osteomyelitis ([place]) [M86.60] 09/07/2015  . Anemia, iron deficiency [D50.9]   . Essential hypertension [I10]   . Anemia of chronic disease [D63.8] 03/08/2015   Total Time spent with patient: 20 minutes   Past Psychiatric History: Please see H&P.   Past Medical History:  Past Medical History:  Diagnosis Date  . Arthritis   . Major depressive disorder, recurrent, severe without psychotic features ([place]) 03/11/2015    Past Surgical History:  Procedure Laterality Date  . AMPUTATION Left 12/07/2015   Procedure: AMPUTATION LEFT GREAT TOE;  Surgeon: Newt Minion, MD;  Location: [facility];  Service: Orthopedics;  Laterality: Left;  . FOOT SURGERY    . HAND SURGERY    . HIP FRACTURE SURGERY    . KNEE SURGERY    . NECK SURGERY     Family History:  Family History  Problem Relation Age of Onset  . Bipolar disorder Sister   . Suicidality Other   . Schizophrenia Other   . Stroke Neg Hx    Family Psychiatric  History:Please see H&P.  Social History:  History  Alcohol Use  . Yes    Comment: Rarely     History  Drug Use No    Social History   Social History  . Marital status: Divorced    Spouse name: N/A  . Number of children: N/A  . Years of education: N/A   Social History Main Topics  . Smoking status: Never Smoker  . Smokeless tobacco: Never Used  . Alcohol use Yes     Comment: Rarely  . Drug use: No  . Sexual activity: No   Other Topics Concern  . None   Social History Narrative  . None   Additional Social History:   Sleep: Fair  Appetite:  Fair  Current Medications: Current  Facility-Administered Medications  Medication Dose Route Frequency Provider Last Rate Last Dose  . alum & mag hydroxide-simeth (MAALOX/MYLANTA) 200-200-20 MG/5ML suspension 30 mL  30 mL Oral Q4H PRN Patrecia Pour, NP      . calcium-vitamin D (OSCAL WITH D) 500-200 MG-UNIT per tablet 1 tablet  1 tablet Oral BID Patrecia Pour, NP   1 tablet at 04/27/16 [phone]  . DULoxetine (CYMBALTA) DR capsule 30 mg  30 mg Oral Daily Ursula Alert, MD   30 mg at 04/27/16 0842  . feeding supplement (ENSURE ENLIVE) (ENSURE ENLIVE) liquid 237 mL  237 mL Oral BID BM Myer Peer Rmoni Keplinger, MD   237 mL at 04/26/16 1559  . ferrous sulfate tablet 325 mg  325 mg Oral BID WC Ursula Alert, MD   325 mg at 04/27/16 0842  . folic acid (FOLVITE) tablet 1 mg  1 mg Oral Daily Patrecia Pour, NP   1 mg at 04/27/16 0842  . hydrALAZINE (APRESOLINE) tablet 10 mg  10 mg Oral Q8H PRN Kerrie Buffalo, NP      . lidocaine (LIDODERM) 5 % 2 patch  2 patch Transdermal Q24H Ursula Alert, MD   2 patch at 04/27/16 1206  . magnesium hydroxide (MILK OF MAGNESIA) suspension 30 mL  30 mL Oral Daily PRN Patrecia Pour, NP      . naproxen (NAPROSYN) tablet 375 mg  375 mg Oral BID PRN Ursula Alert, MD      . predniSONE (DELTASONE) tablet 10 mg  10 mg Oral Daily Patrecia Pour, NP   10 mg at 04/27/16 0842  . sertraline (ZOLOFT) tablet 25 mg  25 mg Oral Daily Ursula Alert, MD   25 mg at 04/27/16 0842  . traMADol (ULTRAM) tablet 100 mg  100 mg Oral TID WC & HS Patrecia Pour, NP   100 mg at 04/27/16 1205  . traZODone (DESYREL) tablet 100 mg  100 mg Oral QHS Ursula Alert, MD   100 mg at 04/26/16 2136    Lab Results:  Results for orders placed or performed during the hospital encounter of 04/24/16 (from the past 48 hour(s))  Vitamin B12     Status: None   Collection Time: 04/26/16  6:51 AM  Result Value Ref Range   Vitamin B-12 256 180 - 914 pg/mL    Comment: (NOTE) This assay is not validated for testing neonatal or myeloproliferative syndrome  specimens for Vitamin B12 levels. Performed at [facility]   Folate     Status: None   Collection Time: 04/26/16  6:51 AM  Result Value Ref Range   Folate 24.4 >5.9 ng/mL    Comment: Performed at [facility]  Iron and TIBC     Status: Abnormal   Collection Time: 04/26/16  6:51 AM  Result Value Ref Range   Iron 25 (L) 28 -  170 ug/dL   TIBC 217 (L) 250 - 450 ug/dL   Saturation Ratios 12 10.4 - 31.8 %   UIBC 192 ug/dL    Comment: Performed at [facility]  Ferritin     Status: None   Collection Time: 04/26/16  6:51 AM  Result Value Ref Range   Ferritin 103 11 - 307 ng/mL    Comment: Performed at [facility]  TSH     Status: None   Collection Time: 04/26/16  6:51 AM  Result Value Ref Range   TSH 0.972 0.350 - 4.500 uIU/mL    Comment: Performed at [place] D 25 Hydroxy (Vit-D Deficiency, Fractures)     Status: None   Collection Time: 04/26/16  6:51 AM  Result Value Ref Range   Vit D, 25-Hydroxy 32.1 30.0 - 100.0 ng/mL    Comment: (NOTE) Vitamin D deficiency has been defined by the [place] practice guideline as a level of serum 25-OH vitamin D less than 20 ng/mL (1,2). The Endocrine Society went on to further define vitamin D insufficiency as a level between 21 and 29 ng/mL (2). 1. IOM (Institute of Medicine). 2010. Dietary reference   intakes for calcium and D. [place]: The   [employer]. 2. Holick MF, Binkley Chepachet, Bischoff-Ferrari HA, et al.   Evaluation, treatment, and prevention of vitamin D   deficiency: an Endocrine Society clinical practice   guideline. JCEM. 2011 Jul; 96(7):1911-30. Performed At: [facility] [place], [place] [id number] Lindon Romp MD MB:[id number] Performed at [facility] metabolic panel     Status: Abnormal   Collection Time: 04/26/16  6:51 AM  Result Value Ref Range    Sodium 138 135 - 145 mmol/L   Potassium 4.1 3.5 - 5.1 mmol/L   Chloride 100 (L) 101 - 111 mmol/L   CO2 30 22 - 32 mmol/L   Glucose, Bld 106 (H) 65 - 99 mg/dL   BUN 23 (H) 6 - 20 mg/dL   Creatinine, Ser 1.14 (H) 0.44 - 1.00 mg/dL   Calcium 9.7 8.9 - 10.3 mg/dL   GFR calc non Af Amer 53 (L) >60 mL/min   GFR calc Af Amer >60 >60 mL/min    Comment: (NOTE) The eGFR has been calculated using the CKD EPI equation. This calculation has not been validated in all clinical situations. eGFR's persistently <60 mL/min signify possible Chronic Kidney Disease.    Anion gap 8 5 - 15    Comment: Performed at [facility]    Blood Alcohol level:  Lab Results  Component Value Date   [facility] <5 04/23/2016   ETH <5 53/64/6803    Metabolic Disorder Labs: No results found for: HGBA1C, MPG No results found for: PROLACTIN No results found for: CHOL, TRIG, HDL, CHOLHDL, VLDL, LDLCALC  Physical Findings: AIMS: Facial and Oral Movements Muscles of Facial Expression: None, normal Lips and Perioral Area: None, normal Jaw: None, normal Tongue: None, normal,Extremity Movements Upper (arms, wrists, hands, fingers): None, normal Lower (legs, knees, ankles, toes): None, normal, Trunk Movements Neck, shoulders, hips: None, normal, Overall Severity Severity of abnormal movements (highest score from questions above): None, normal Incapacitation due to abnormal movements: None, normal Patient's awareness of abnormal movements (rate only patient's report): No Awareness, Dental Status Current problems with teeth and/or dentures?: No Does patient usually wear dentures?: No  CIWA:    COWS:  Musculoskeletal: Strength & Muscle Tone: normal  Gait & Station: walks slowly due to pain and history of toe amputation  Patient leans: N/A  Psychiatric Specialty Exam: Physical Exam  Nursing note and vitals reviewed.   Review of Systems  Musculoskeletal: Positive for back pain, joint pain and neck  pain.  Psychiatric/Behavioral: Positive for depression. The patient is nervous/anxious.   All other systems reviewed and are negative. chronic pain and discomfort related to RA. Denies vomiting, no fever, no chills, no rash   Blood pressure 104/68, pulse 86, temperature 97.7 F (36.5 C), temperature source Oral, resp. rate 18, height 5' 4"  (1.626 m), weight 102 lb (46.3 kg), SpO2 100 %.Body mass index is 17.51 kg/m.  General Appearance: improved grooming   Eye Contact:  Fair  Speech:  Normal Rate  Volume:  Normal  Mood:  Depressed, but partially improved   Affect:  Constricted , but does smile appropriately at times   Thought Process:  Linear   Orientation:  Full (Time, Place, and Person)  Thought Content:  Logical  Suicidal Thoughts:  No at this time denies suicidal ideations, denies any self injurious ideations , contracts for safety on the unit  Homicidal Thoughts:  No denies homicidal ideations   Memory:  Recent and remote grossly intact    Judgement:  Improving   Insight:   Improving   Psychomotor Activity:  Normal  Concentration:  Concentration: Good and Attention Span: Good  Recall:  Good  Fund of Knowledge:  Good  Language:  Good  Akathisia:  No  Handed:  Right  AIMS (if indicated):     Assets:  Desire for Improvement  ADL's:  Needs help  Cognition:  WNL  Sleep:  Number of Hours: 5.5   Assessment - at this time patient is partially improved compared to admission, but reports chronic depression related to her chronic medical illness and related physical limitations . Denies current suicidal ideations At this time denies medication side effects and is tolerating Cymbalta trial well thus far. Patient is hoping to be able to go to an [place] due to functional difficulties affecting her ability to function independently    Treatment Plan Summary: Encourage ongoing group and milieu participation to work on coping skills and symptom reduction   Will D/C  Zoloft , which has been tapered down  Continue Cymbalta 30 mgrs QDAY for depression , anxiety  Continue Trazodone  100 mg po QHS   PRN for sleep. 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     Neita Garnet, MD 04/27/2016, 2:17 PM

## 2016-04-27 NOTE — Progress Notes (Signed)
Adult Psychoeducational Group Note  Date:  04/27/2016 Time:  9:27 PM  Group Topic/Focus:  Wrap-Up Group:   The focus of this group is to help patients review their daily goal of treatment and discuss progress on daily workbooks.   Participation Level:  Active  Participation Quality:  Appropriate  Affect:  Appropriate  Cognitive:  Alert  Insight: Appropriate  Engagement in Group:  Engaged  Modes of Intervention:  Discussion  Additional Comments:  On a scale between 1-10, (1=worse, 10=best) patient rated her day a 5.  Berda Shelvin L Journey Ratterman 04/27/2016, 9:27 PM

## 2016-04-28 DIAGNOSIS — Z79899 Other long term (current) drug therapy: Secondary | ICD-10-CM

## 2016-04-28 DIAGNOSIS — Z818 Family history of other mental and behavioral disorders: Secondary | ICD-10-CM

## 2016-04-28 LAB — BASIC METABOLIC PANEL
ANION GAP: 7 (ref 5–15)
BUN: 19 mg/dL (ref 6–20)
CALCIUM: 8.8 mg/dL — AB (ref 8.9–10.3)
CO2: 31 mmol/L (ref 22–32)
CREATININE: 1.08 mg/dL — AB (ref 0.44–1.00)
Chloride: 102 mmol/L (ref 101–111)
GFR calc Af Amer: >60 mL/min (ref >60)
GFR, EST NON AFRICAN AMERICAN: 56 mL/min — AB (ref >60)
GLUCOSE: 78 mg/dL (ref 65–99)
Potassium: 4.5 mmol/L (ref 3.5–5.1)
Sodium: 140 mmol/L (ref 135–145)

## 2016-04-28 MED ORDER — DULOXETINE HCL 60 MG PO CPEP
60.0000 mg | ORAL_CAPSULE | Freq: Every day | ORAL | Status: DC
  Administered 2016-04-29 – 2016-05-15 (×17): 60 mg via ORAL
  Filled 2016-04-28 (×5): qty 1
  Filled 2016-04-28: qty 7
  Filled 2016-04-28 (×14): qty 1

## 2016-04-28 MED ORDER — TRAMADOL HCL 50 MG PO TABS
100.0000 mg | ORAL_TABLET | Freq: Four times a day (QID) | ORAL | Status: DC
  Administered 2016-04-28 (×3): 100 mg via ORAL
  Administered 2016-04-29: 09:00:00 via ORAL
  Administered 2016-04-29 – 2016-05-04 (×21): 100 mg via ORAL
  Filled 2016-04-28 (×25): qty 2

## 2016-04-28 NOTE — [facility] Suicide Risk Assessment (Signed)
[facility] INPATIENT:  Family/Significant Other Suicide Prevention Education  Suicide Prevention Education:  Education Completed; [name], Pt's father [phone],  has been identified by the patient as the family member/significant other with whom the patient will be residing, and identified as the person(s) who will aid the patient in the event of a mental health crisis (suicidal ideations/suicide attempt).  With written consent from the patient, the family member/significant other has been provided the following suicide prevention education, prior to the and/or following the discharge of the patient.  The suicide prevention education provided includes the following:  Suicide risk factors  Suicide prevention and interventions  National Suicide Hotline telephone number  [facility] assessment telephone number  [facility] Emergency Assistance 911  [facility] and/or Residential Mobile Crisis Unit telephone number  Request made of family/significant other to:  Remove weapons (e.g., guns, rifles, knives), all items previously/currently identified as safety concern.    Remove drugs/medications (over-the-counter, prescriptions, illicit drugs), all items previously/currently identified as a safety concern.  The family member/significant other verbalizes understanding of the suicide prevention education information provided.  The family member/significant other agrees to remove the items of safety concern listed above.  Tara Brown 04/28/2016, 4:34 PM

## 2016-04-28 NOTE — Progress Notes (Signed)
Pt requested PRN Ultram this morning.  Informed pt that another dose would exceed 400 mg within the past 24 hours at this time.  Pt verbalized understanding.  PRN naproxen was offered for pain, pt declined.  Assisted pt with getting her coffee.  Encouraged PO fluids.  Pt reports she has been increasing PO fluid intake.  Pt denies further needs and concerns at this time.  Will continue to monitor and assess.

## 2016-04-28 NOTE — Progress Notes (Signed)
D: Pt has depressed affect and mood.  She reports her day was "okay."  Pt reports her goal was to "get a shower today."  Pt discussed how she needs assistance to shower and writer offered to get MHT to assist pt.  Pt stated "that's okay, I'll just wait 'til tomorrow."  Pt denies SI/HI, denies hallucinations, reports pain of 6/10.  Pt has been visible in milieu interacting with peers and staff appropriately.  Pt attended evening group.   A: Introduced self to pt.  Met with pt and offered support and encouragement.  Actively listened to pt.  Medication administered per order.  PRN medication administered for pain. R: Pt is compliant with medications.  Pt verbally contracts for safety.  Will continue to monitor and assess.

## 2016-04-28 NOTE — Progress Notes (Signed)
[facility] MD Progress Note  04/28/2016 10:52 AM Tara Brown  MRN:  [id number] Subjective: Patient reports that her mood has improved partially, and at this time she denies any active suicidal ideations . She states, however, that her depression is " situational more than anything else " and that therefore her expectation is that she is going to continue feeling depressed, particularly as she continues to gradually develop worsening functional limitations due to her chronic medical illness--Rheumatoid Arthritis which she has had since age 55.Patient complains of pain from the Rheumatoid Arthritis so tramadol will be changed back to her home schedule of 4x daily and Cymbalta will be increased to 23m since she is completely off the zoloft. At this time denies medication side effects.  Objective: I have met with patient and have reviewed chart notes. Patient is a 55year old female, known to our unit from a prior psychiatric admission . She presented for depression and a suicide attempt by overdosing on Ambien. She has a history of severe, chronic Rheumatoid Arthritis, which has caused gradual but progressive loss of her ability to function independently. Patient states her range of motion has decreased further, making it difficult for her to function in activities she used to be able to do without difficulty, such as driving . She states " I have been trying to hold on to my independence as long as I can, but now I think I need to go to an assisted living place ".  She states that due to insurance constraints , not having medicaid , " it has been very hard " ( referring to placement efforts ) . At this time reports ongoing depression 5/10 and has improved a little bit, due to above, denies suicidal ideations at this time , and expresses a sense of regret about her suicidal attempt, stating " I know how my family would suffer if I killed myself, so I do not want to do it ". Patient visible on the unit, going  to some groups . No disruptive behaviors on unit. States sleep improving, appetite improving . Denies medication side effects.       Principal Problem: Major depressive disorder, recurrent severe without psychotic features ([place] Diagnosis:   Patient Active Problem List   Diagnosis Date Noted  . Back pain [M54.9] 04/25/2016  . Major depressive disorder, recurrent severe without psychotic features ([place] [F33.2] 04/24/2016  . Left foot infection [L08.9] 12/06/2015  . Suicidal ideations [R45.851] 12/06/2015  . Rheumatoid arthritis ([facility] [M06.9] 12/06/2015  . Foot infection [L08.9] 12/05/2015  . Cellulitis of toe, left [L03.032]   . Chronic osteomyelitis ([place] [M86.60] 09/07/2015  . Anemia, iron deficiency [D50.9]   . Essential hypertension [I10]   . Anemia of chronic disease [D63.8] 03/08/2015   Total Time spent with patient: 20 minutes   Past Psychiatric History: Please see H&P.   Past Medical History:  Past Medical History:  Diagnosis Date  . Arthritis   . Major depressive disorder, recurrent, severe without psychotic features ([place] 03/11/2015    Past Surgical History:  Procedure Laterality Date  . AMPUTATION Left 12/07/2015   Procedure: AMPUTATION LEFT GREAT TOE;  Surgeon: MNewt Minion MD;  Location: [facility];  Service: Orthopedics;  Laterality: Left;  . FOOT SURGERY    . HAND SURGERY    . HIP FRACTURE SURGERY    . KNEE SURGERY    . NECK SURGERY     Family History:  Family History  Problem Relation Age of Onset  .  Bipolar disorder Sister   . Suicidality Other   . Schizophrenia Other   . Stroke Neg Hx    Family Psychiatric  History:Please see H&P.  Social History:  History  Alcohol Use  . Yes    Comment: Rarely     History  Drug Use No    Social History   Social History  . Marital status: Divorced    Spouse name: N/A  . Number of children: N/A  . Years of education: N/A   Social History Main Topics  . Smoking status: Never Smoker  . Smokeless  tobacco: Never Used  . Alcohol use Yes     Comment: Rarely  . Drug use: No  . Sexual activity: No   Other Topics Concern  . None   Social History Narrative  . None   Additional Social History:   Sleep: Fair  Appetite:  Fair  Current Medications: Current Facility-Administered Medications  Medication Dose Route Frequency Provider Last Rate Last Dose  . alum & mag hydroxide-simeth (MAALOX/MYLANTA) 200-200-20 MG/5ML suspension 30 mL  30 mL Oral Q4H PRN Patrecia Pour, NP      . calcium-vitamin D (OSCAL WITH D) 500-200 MG-UNIT per tablet 1 tablet  1 tablet Oral BID Patrecia Pour, NP   1 tablet at 04/28/16 0820  . DULoxetine (CYMBALTA) DR capsule 30 mg  30 mg Oral Daily Ursula Alert, MD   30 mg at 04/28/16 0820  . feeding supplement (ENSURE ENLIVE) (ENSURE ENLIVE) liquid 237 mL  237 mL Oral BID BM Myer Peer Cobos, MD   237 mL at 04/28/16 1000  . ferrous sulfate tablet 325 mg  325 mg Oral BID WC Ursula Alert, MD   325 mg at 04/28/16 0820  . folic acid (FOLVITE) tablet 1 mg  1 mg Oral Daily Patrecia Pour, NP   1 mg at 04/28/16 0820  . hydrALAZINE (APRESOLINE) tablet 10 mg  10 mg Oral Q8H PRN Kerrie Buffalo, NP      . lidocaine (LIDODERM) 5 % 2 patch  2 patch Transdermal Q24H Ursula Alert, MD   2 patch at 04/27/16 1206  . magnesium hydroxide (MILK OF MAGNESIA) suspension 30 mL  30 mL Oral Daily PRN Patrecia Pour, NP      . naproxen (NAPROSYN) tablet 375 mg  375 mg Oral BID PRN Ursula Alert, MD   375 mg at 04/27/16 1546  . predniSONE (DELTASONE) tablet 10 mg  10 mg Oral Daily Patrecia Pour, NP   10 mg at 04/28/16 0820  . traMADol (ULTRAM) tablet 100 mg  100 mg Oral Q6H PRN Jenne Campus, MD   100 mg at 04/28/16 0932  . traZODone (DESYREL) tablet 100 mg  100 mg Oral QHS PRN Jenne Campus, MD   100 mg at 04/27/16 2120    Lab Results:  Results for orders placed or performed during the hospital encounter of 04/24/16 (from the past 48 hour(s))  Basic metabolic panel      Status: Abnormal   Collection Time: 04/27/16  6:34 PM  Result Value Ref Range   Sodium 138 135 - 145 mmol/L   Potassium 4.5 3.5 - 5.1 mmol/L   Chloride 101 101 - 111 mmol/L   CO2 29 22 - 32 mmol/L   Glucose, Bld 131 (H) 65 - 99 mg/dL   BUN 19 6 - 20 mg/dL   Creatinine, Ser 1.06 (H) 0.44 - 1.00 mg/dL   Calcium 8.7 (L) 8.9 - 10.3 mg/dL  GFR calc non Af Amer 58 (L) >60 mL/min   GFR calc Af Amer >60 >60 mL/min    Comment: (NOTE) The eGFR has been calculated using the CKD EPI equation. This calculation has not been validated in all clinical situations. eGFR's persistently <60 mL/min signify possible Chronic Kidney Disease.    Anion gap 8 5 - 15    Comment: Performed at [facility] metabolic panel     Status: Abnormal   Collection Time: 04/28/16  6:15 AM  Result Value Ref Range   Sodium 140 135 - 145 mmol/L   Potassium 4.5 3.5 - 5.1 mmol/L   Chloride 102 101 - 111 mmol/L   CO2 31 22 - 32 mmol/L   Glucose, Bld 78 65 - 99 mg/dL   BUN 19 6 - 20 mg/dL   Creatinine, Ser 1.08 (H) 0.44 - 1.00 mg/dL   Calcium 8.8 (L) 8.9 - 10.3 mg/dL   GFR calc non Af Amer 56 (L) >60 mL/min   GFR calc Af Amer >60 >60 mL/min    Comment: (NOTE) The eGFR has been calculated using the CKD EPI equation. This calculation has not been validated in all clinical situations. eGFR's persistently <60 mL/min signify possible Chronic Kidney Disease.    Anion gap 7 5 - 15    Comment: Performed at [facility]    Blood Alcohol level:  Lab Results  Component Value Date   [facility] <5 04/23/2016   ETH <5 73/42/8768    Metabolic Disorder Labs: No results found for: HGBA1C, MPG No results found for: PROLACTIN No results found for: CHOL, TRIG, HDL, CHOLHDL, VLDL, LDLCALC  Physical Findings: AIMS: Facial and Oral Movements Muscles of Facial Expression: None, normal Lips and Perioral Area: None, normal Jaw: None, normal Tongue: None, normal,Extremity Movements Upper  (arms, wrists, hands, fingers): None, normal Lower (legs, knees, ankles, toes): None, normal, Trunk Movements Neck, shoulders, hips: None, normal, Overall Severity Severity of abnormal movements (highest score from questions above): None, normal Incapacitation due to abnormal movements: None, normal Patient's awareness of abnormal movements (rate only patient's report): No Awareness, Dental Status Current problems with teeth and/or dentures?: No Does patient usually wear dentures?: No  CIWA:    COWS:     Musculoskeletal: Strength & Muscle Tone: normal  Gait & Station: walks slowly due to pain and history of toe amputation  Patient leans: N/A  Psychiatric Specialty Exam: Physical Exam  Nursing note and vitals reviewed.   Review of Systems  Musculoskeletal: Positive for back pain, joint pain and neck pain.  Psychiatric/Behavioral: Positive for depression. The patient is nervous/anxious.   All other systems reviewed and are negative. chronic pain and discomfort related to RA. Denies vomiting, no fever, no chills, no rash   Blood pressure 115/65, pulse 74, temperature 98.6 F (37 C), resp. rate 16, height 5' 4"  (1.626 m), weight 46.3 kg (102 lb), SpO2 100 %.Body mass index is 17.51 kg/m.  General Appearance: improved grooming   Eye Contact:  Fair  Speech:  Normal Rate  Volume:  Normal  Mood:  Depressed, but partially improved   Affect:  Constricted , but does smile appropriately at times   Thought Process:  Linear   Orientation:  Full (Time, Place, and Person)  Thought Content:  Logical  Suicidal Thoughts:  No at this time denies suicidal ideations, denies any self injurious ideations , contracts for safety on the unit  Homicidal Thoughts:  No denies homicidal ideations  Memory:  Recent and remote grossly intact    Judgement:  Improving   Insight:   Improving   Psychomotor Activity:  Normal  Concentration:  Concentration: Good and Attention Span: Good  Recall:  Good  Fund  of Knowledge:  Good  Language:  Good  Akathisia:  No  Handed:  Right  AIMS (if indicated):     Assets:  Desire for Improvement  ADL's:  Needs help  Cognition:  WNL  Sleep:  Number of Hours: 6.5   Assessment - at this time patient is partially improved compared to admission, but reports chronic depression related to her chronic medical illness and related physical limitations . Denies current suicidal ideations At this time denies medication side effects and is tolerating Cymbalta trial well thus far. Patient is hoping to be able to go to an [place] due to functional difficulties affecting her ability to function independently    Treatment Plan Summary: Encourage ongoing group and milieu participation to work on coping skills and symptom reduction   Will D/C Zoloft , which has been tapered down  Continue Cymbalta 30 mgrs QDAY for depression , anxiety  Continue Trazodone  100 mg po QHS   PRN for sleep. 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     Ruffin Frederick, MD 04/28/2016, 10:52 AM

## 2016-04-28 NOTE — Progress Notes (Signed)
Lab work reviewed. I would recommend discharging off of ACE Inhibitor and encouraging oral fluid intake. Pt may follow up with her primary care physician for further evaluation and recommendations.  Penny Pia  Signing off please call should any new medical concerns arise.

## 2016-04-28 NOTE — Progress Notes (Addendum)
Recreation Therapy Notes  Date: 04/28/16 Time: 0930 Location: 300 [address] Group Room  Group Topic: Stress Management  Goal Area(s) Addresses:  Patient will verbalize importance of using healthy stress management.  Patient will identify positive emotions associated with healthy stress management.   Intervention: Stress Management  Activity :  [employer].  LRT introduced patients to the technique of guided imagery.  Patients were to follow along as LRT read script to participate in stress management technique.     Education:  Stress Management, Discharge Planning.   Education Outcome:  Needs additional education  Clinical Observations/Feedback:  Pt did not attend group.    Caroll Rancher, LRT/CTRS    Caroll Rancher A 04/28/2016 12:50 PM

## 2016-04-28 NOTE — Tx Team (Signed)
Interdisciplinary Treatment Plan Update (Adult) Date: 04/28/2016   Date: 04/28/2016 1:17 PM  Progress in Treatment:  Attending groups: Yes Participating in groups: Minimally Taking medication as prescribed: Yes  Tolerating medication: Yes  Family/Significant othe contact made: No, CSW assessing for appropriate contact Patient understands diagnosis: Yes AEB seeking help with depression Discussing patient identified problems/goals with staff: Yes  Medical problems stabilized or resolved: Yes  Denies suicidal/homicidal ideation: Yes Patient has not harmed self or Others: Yes   New problem(s) identified: None identified at this time.   Discharge Plan or Barriers: CSW will assess for appropriate discharge plan and relevant barriers.   04/28/16: Pt will return home and follow-up with outpatient services.   Additional comments:  Patient and CSW reviewed pt's identified goals and treatment plan. Patient verbalized understanding and agreed to treatment plan.   Reason for Continuation of Hospitalization:  Depression Medication stabilization Suicidal ideation  Estimated length of stay: 2-3 days  Review of initial/current patient goals per problem list:   1.  Goal(s): Patient will participate in aftercare plan  Met:  Yes  Target date: 3-5 days from date of admission   As evidenced by: Patient will participate within aftercare plan AEB aftercare provider and housing plan at discharge being identified.  04/25/16: CSW to work with Pt to assess for appropriate discharge plan and faciliate appointments and referrals as needed prior to d/c. 04/28/16: Pt will return home and follow-up with outpatient services.   2.  Goal (s): Patient will exhibit decreased depressive symptoms and suicidal ideations.  Met:  Progressing   Target date: 3-5 days from date of admission   As evidenced by: Patient will utilize self rating of depression at 3 or below and demonstrate decreased signs of depression or  be deemed stable for discharge by MD. 04/25/16: Pt was admitted with symptoms of depression, rating 10/10. Pt continues to present with flat affect and depressive symptoms.  Pt will demonstrate decreased symptoms of depression and rate depression at 3/10 or lower prior to discharge. 04/28/16: Pt rates depression at 5/10; denies SI  Attendees:  Patient:    Family:    Physician: Dr. Julieanne Manson, MD  04/28/2016 1:17 PM  Nursing: Sandre Kitty, RN; Idell Pickles, RN 04/28/2016 1:17 PM  Clinical Social Worker Peri Maris, [place] 04/28/2016 1:17 PM  Other: Tilden Fossa, LCSWA 04/28/2016 1:17 PM  Clinical: Lars Pinks, RN Case manager  04/28/2016 1:17 PM  Other:  04/28/2016 1:17 PM  Other:     Peri Maris, [place] Social Work [phone]

## 2016-04-28 NOTE — [facility] Group Notes (Signed)
[facility] LCSW Group Therapy 04/28/2016 1:15pm  Type of Therapy: Group Therapy- Feelings Around Relapse and Recovery  Participation Level: Reserved  Participation Quality:  Appropriate  Affect:  Appropriate  Cognitive: Alert and Oriented   Insight:  Developing   Engagement in Therapy: Limited  Modes of Intervention: Clarification, Confrontation, Discussion, Education, Exploration, Limit-setting, Orientation, Problem-solving, Rapport Building, Dance movement psychotherapist, Socialization and Support  Summary of Progress/Problems: The topic for today was feelings about relapse. The group discussed what relapse prevention is to them and identified triggers that they are on the path to relapse. Members also processed their feeling towards relapse and were able to relate to common experiences. Group also discussed coping skills that can be used for relapse prevention.  Pt continues to be attentive in group discussion but participation is minimal.    Therapeutic Modalities:   Cognitive Behavioral Therapy Solution-Focused Therapy Assertiveness Training Relapse Prevention Therapy    Lamar Sprinkles [phone] 04/28/2016 4:39 PM

## 2016-04-28 NOTE — Progress Notes (Signed)
Patient ID: Tara Brown, female   DOB: [birth year], 55 y.o.   MRN: [id number] PER STATE REGULATIONS 482.30  THIS CHART WAS REVIEWED FOR MEDICAL NECESSITY WITH RESPECT TO THE PATIENT'S ADMISSION/ DURATION OF STAY.  NEXT REVIEW DATE: 05/02/2016  Willa Rough, RN, BSN CASE MANAGER

## 2016-04-28 NOTE — Progress Notes (Signed)
The focus of this group is to help patients review their daily goal of treatment and discuss progress on daily workbooks.  Patient attended group and participated this evening. Patient states that her goal of the day was to shower and she did. Patient rated her day as a 6/10.

## 2016-04-28 NOTE — Progress Notes (Signed)
Tara Brown is seen out in the dayroom today. She ambulates slowly and kind of rocks back and forth from side to side as she pitches herself forward ..full range of motion side to side ( on uneven toes). A Writer assisted pt to bathe and wash her hair this am.Pt stated she has lost so much weight " over 40 pounds" that none of her clothes fit her anymore.She remains a high fall risk and is given yellow socks by this Clinical research associate. Pt completes her daily assessment and on it she wrote she deneid SI and she rated her depression, hopelessness and anxie" 5/5/5/", respectively. R Safety in place admits to this nurse that she realizes she should not live by herslef any longer. R Safety in place.

## 2016-04-29 MED ORDER — LORAZEPAM 0.5 MG PO TABS
ORAL_TABLET | ORAL | Status: AC
  Filled 2016-04-29: qty 1

## 2016-04-29 MED ORDER — LORAZEPAM 0.5 MG PO TABS
0.5000 mg | ORAL_TABLET | Freq: Every day | ORAL | Status: AC | PRN
  Administered 2016-04-29: 0.5 mg via ORAL

## 2016-04-29 MED ORDER — TRAZODONE HCL 100 MG PO TABS
100.0000 mg | ORAL_TABLET | Freq: Every evening | ORAL | Status: DC | PRN
  Administered 2016-04-29 – 2016-05-03 (×6): 100 mg via ORAL
  Filled 2016-04-29 (×17): qty 1

## 2016-04-29 NOTE — [facility] Group Notes (Signed)
[facility] Group Notes:  (Nursing/MHT/Case Management/Adjunct)  Date:  04/29/2016  Time:  6:31 PM  Type of Therapy:  Nurse Education  Participation Level:  Did Not Attend   Almira Bar 04/29/2016, 6:31 PM

## 2016-04-29 NOTE — Progress Notes (Signed)
Patient ID: Tara Brown, female   DOB: [birth year], 55 y.o.   MRN: [id number]   Pt currently presents with a flat affect and depressed behavior. Pt requests nutritional supplement instead of a snack tonight. Pt states "It has been an okay day." Pt reports good sleep with current medication regimen. Pt forwards little to Clinical research associate. Pt reports pain in her back 8/10.   Pt provided with medications per providers orders. Pt's labs and vitals were monitored throughout the night. Pt supported emotionally and encouraged to express concerns and questions. Pt educated on medications.  Pt's safety ensured with 15 minute and environmental checks. Pt currently denies SI/HI and A/V hallucinations. Pt verbally agrees to seek staff if SI/HI or A/VH occurs and to consult with staff before acting on any harmful thoughts. Will continue POC.

## 2016-04-29 NOTE — [facility] Group Notes (Signed)
[facility] Group Notes:  (Clinical Social Work)  04/29/2016    11:00AM-12:00PM  Summary of Progress/Problems:   The main focus of today's process group was to explore in depth the perceived benefits and costs of unhealthy coping techniques, as well as the  benefits and costs of replacing that with a healthy coping skills.  After talking about the possible benefits and definite costs of patients' specific unhealthy coping techniques, the patients were given the opportunity to write a goodbye letter to the unhealthy coping of their choice.  Some patients shared their letters with the group.  It was a tearful group with a lot of mutual support.  The patient expressed a) healthy and b) unhealthy coping techniques including a) decorating and b) isolation.  She was supportive of others and showed insight.  Type of Therapy:  Group Therapy - Process   Participation Level:  Active  Participation Quality:  Appropriate  Affect:  Blunted  Cognitive:  Appropriate  Insight:  Engaged  Engagement in Therapy:  Engaged  Modes of Intervention:  Exploration, Activity  Ambrose Mantle, LCSW 04/29/2016, 1:43 PM

## 2016-04-29 NOTE — Progress Notes (Signed)
Patient ID: Tara Brown, female   DOB: 24-May-1961, 55 y.o.   MRN: [id number]    D: Pt has been very flat and depressed on the unit today, pt reported that she was lots of anxiety. Pt reported that her last admission she was on Ativan three times a day for anxiety and that she wanted the doctor to reorder. Fredna Dow NP was made aware, she ordered the patient a one time dose of Ativan. Pt reported that the Ativan helped her anxiety. Pt reported no other issues or concerns. Pt reported being negative SI/HI, no AH/VH noted. A: 15 min checks continued for patient safety. R: Pt safety maintained.

## 2016-04-29 NOTE — Progress Notes (Signed)
Patient ID: Tara Brown, female   DOB: 08/29/1961, 55 y.o.   MRN: [id number] D: Patient calm and cooperative on approach. Pt reports tolerating medications well. Pt stated disclosing to family about needing help with ADLs and possible getting into an assisted living facility. Denies SI/HI/AVH. A: Support and encouragement offered as needed. Medications administered as prescribed.  R: Patient is safe and compliant with medications. Pt attended and engage in evening wrap up group.

## 2016-04-29 NOTE — Progress Notes (Signed)
[facility] MD Progress Note  04/29/2016 2:25 PM Tara Brown  MRN:  [id number]   Subjective: " Im fine. Just average  5 on scale. Im trying to possibly trying to find assisted living facility. Lauren is applying for me to go. I dont have medicaid so I cant get assistance with [facility]. My sister used to pay for me but she is no longer working.   During this morning evaluation she was observed to present with improved mood and brighter affect upon approach. After lunch she presented to the nurses station to report to the increase anxiety and thoughts of wanting to pull her skin off her face. " I need to take my mirror down I dont like the way I look." She was given prn medications, provided a nursing student to process her thoughts, and was able to contract for safety.   Objective: I have met with patient and have reviewed chart notes. Patient is a 55 year old female, known to our unit from a prior psychiatric admission . She presented for depression and a suicide attempt by overdosing on Ambien. She continues to ruminate about her severe, chronic Rheumatoid Arthritis, which has caused gradual but progressive loss of her ability to function independently. She reports increased depression when she has to think about her daily activities that she can no longer perform on her own.. At this time reports ongoing depression 5/10 and has improved a little bit, due to above, denies suicidal ideations at this time. Patient visible on the unit, going to some groups . No disruptive behaviors on unit. States sleep improving, appetite improving . Denies medication side effects.   Principal Problem: Major depressive disorder, recurrent severe without psychotic features ([place]) Diagnosis:   Patient Active Problem List   Diagnosis Date Noted  . Back pain [M54.9] 04/25/2016  . Major depressive disorder, recurrent severe without psychotic features ([place]) [F33.2] 04/24/2016  . Left foot infection [L08.9] 12/06/2015  . Suicidal  ideations [R45.851] 12/06/2015  . Rheumatoid arthritis ([place]) [M06.9] 12/06/2015  . Foot infection [L08.9] 12/05/2015  . Cellulitis of toe, left [L03.032]   . Chronic osteomyelitis ([place]) [M86.60] 09/07/2015  . Anemia, iron deficiency [D50.9]   . Essential hypertension [I10]   . Anemia of chronic disease [D63.8] 03/08/2015   Total Time spent with patient: 20 minutes   Past Psychiatric History: Please see H&P.   Past Medical History:  Past Medical History:  Diagnosis Date  . Arthritis   . Major depressive disorder, recurrent, severe without psychotic features ([place]) 03/11/2015    Past Surgical History:  Procedure Laterality Date  . AMPUTATION Left 12/07/2015   Procedure: AMPUTATION LEFT GREAT TOE;  Surgeon: Newt Minion, MD;  Location: [facility];  Service: Orthopedics;  Laterality: Left;  . FOOT SURGERY    . HAND SURGERY    . HIP FRACTURE SURGERY    . KNEE SURGERY    . NECK SURGERY     Family History:  Family History  Problem Relation Age of Onset  . Bipolar disorder Sister   . Suicidality Other   . Schizophrenia Other   . Stroke Neg Hx    Family Psychiatric  History:Please see H&P.  Social History:  History  Alcohol Use  . Yes    Comment: Rarely     History  Drug Use No    Social History   Social History  . Marital status: Divorced    Spouse name: N/A  . Number of children: N/A  . Years of education:  N/A   Social History Main Topics  . Smoking status: Never Smoker  . Smokeless tobacco: Never Used  . Alcohol use Yes     Comment: Rarely  . Drug use: No  . Sexual activity: No   Other Topics Concern  . None   Social History Narrative  . None   Additional Social History:   Sleep: Fair  Appetite:  Fair  Current Medications: Current Facility-Administered Medications  Medication Dose Route Frequency Provider Last Rate Last Dose  . alum & mag hydroxide-simeth (MAALOX/MYLANTA) 200-200-20 MG/5ML suspension 30 mL  30 mL Oral Q4H PRN Patrecia Pour, NP       . calcium-vitamin D (OSCAL WITH D) 500-200 MG-UNIT per tablet 1 tablet  1 tablet Oral BID Patrecia Pour, NP   1 tablet at 04/29/16 0857  . DULoxetine (CYMBALTA) DR capsule 60 mg  60 mg Oral Daily Sueanne Margarita, MD   60 mg at 04/29/16 0858  . feeding supplement (ENSURE ENLIVE) (ENSURE ENLIVE) liquid 237 mL  237 mL Oral BID BM Myer Peer Cobos, MD   237 mL at 04/28/16 1500  . ferrous sulfate tablet 325 mg  325 mg Oral BID WC Ursula Alert, MD   325 mg at 04/29/16 0858  . folic acid (FOLVITE) tablet 1 mg  1 mg Oral Daily Patrecia Pour, NP   1 mg at 04/29/16 0858  . hydrALAZINE (APRESOLINE) tablet 10 mg  10 mg Oral Q8H PRN Kerrie Buffalo, NP      . lidocaine (LIDODERM) 5 % 2 patch  2 patch Transdermal Q24H Ursula Alert, MD   2 patch at 04/29/16 1257  . LORazepam (ATIVAN) tablet 0.5 mg  0.5 mg Oral Daily PRN Nanci Pina, FNP      . magnesium hydroxide (MILK OF MAGNESIA) suspension 30 mL  30 mL Oral Daily PRN Patrecia Pour, NP      . naproxen (NAPROSYN) tablet 375 mg  375 mg Oral BID PRN Ursula Alert, MD   375 mg at 04/27/16 1546  . predniSONE (DELTASONE) tablet 10 mg  10 mg Oral Daily Patrecia Pour, NP   10 mg at 04/29/16 0858  . traMADol (ULTRAM) tablet 100 mg  100 mg Oral QID Sueanne Margarita, MD   100 mg at 04/29/16 1257  . traZODone (DESYREL) tablet 100 mg  100 mg Oral QHS,MR X 1 Nanci Pina, FNP        Lab Results:  Results for orders placed or performed during the hospital encounter of 04/24/16 (from the past 48 hour(s))  Basic metabolic panel     Status: Abnormal   Collection Time: 04/27/16  6:34 PM  Result Value Ref Range   Sodium 138 135 - 145 mmol/L   Potassium 4.5 3.5 - 5.1 mmol/L   Chloride 101 101 - 111 mmol/L   CO2 29 22 - 32 mmol/L   Glucose, Bld 131 (H) 65 - 99 mg/dL   BUN 19 6 - 20 mg/dL   Creatinine, Ser 1.06 (H) 0.44 - 1.00 mg/dL   Calcium 8.7 (L) 8.9 - 10.3 mg/dL   GFR calc non Af Amer 58 (L) >60 mL/min   GFR calc Af Amer >60 >60 mL/min    Comment:  (NOTE) The eGFR has been calculated using the CKD EPI equation. This calculation has not been validated in all clinical situations. eGFR's persistently <60 mL/min signify possible Chronic Kidney Disease.    Anion gap 8 5 - 15  Comment: Performed at [facility] metabolic panel     Status: Abnormal   Collection Time: 04/28/16  6:15 AM  Result Value Ref Range   Sodium 140 135 - 145 mmol/L   Potassium 4.5 3.5 - 5.1 mmol/L   Chloride 102 101 - 111 mmol/L   CO2 31 22 - 32 mmol/L   Glucose, Bld 78 65 - 99 mg/dL   BUN 19 6 - 20 mg/dL   Creatinine, Ser 1.08 (H) 0.44 - 1.00 mg/dL   Calcium 8.8 (L) 8.9 - 10.3 mg/dL   GFR calc non Af Amer 56 (L) >60 mL/min   GFR calc Af Amer >60 >60 mL/min    Comment: (NOTE) The eGFR has been calculated using the CKD EPI equation. This calculation has not been validated in all clinical situations. eGFR's persistently <60 mL/min signify possible Chronic Kidney Disease.    Anion gap 7 5 - 15    Comment: Performed at [facility]    Blood Alcohol level:  Lab Results  Component Value Date   [facility] <5 04/23/2016   ETH <5 42/68/3419    Metabolic Disorder Labs: No results found for: HGBA1C, MPG No results found for: PROLACTIN No results found for: CHOL, TRIG, HDL, CHOLHDL, VLDL, LDLCALC  Physical Findings: AIMS: Facial and Oral Movements Muscles of Facial Expression: None, normal Lips and Perioral Area: None, normal Jaw: None, normal Tongue: None, normal,Extremity Movements Upper (arms, wrists, hands, fingers): None, normal Lower (legs, knees, ankles, toes): None, normal, Trunk Movements Neck, shoulders, hips: None, normal, Overall Severity Severity of abnormal movements (highest score from questions above): None, normal Incapacitation due to abnormal movements: None, normal Patient's awareness of abnormal movements (rate only patient's report): No Awareness, Dental Status Current problems with teeth  and/or dentures?: No Does patient usually wear dentures?: No  CIWA:    COWS:     Musculoskeletal: Strength & Muscle Tone: normal  Gait & Station: walks slowly due to pain and history of toe amputation  Patient leans: N/A  Psychiatric Specialty Exam: Physical Exam  Nursing note and vitals reviewed.   Review of Systems  Musculoskeletal: Positive for back pain, joint pain and neck pain.  Psychiatric/Behavioral: Positive for depression. The patient is nervous/anxious.   All other systems reviewed and are negative. chronic pain and discomfort related to RA. Denies vomiting, no fever, no chills, no rash   Blood pressure 127/74, pulse 83, temperature 97.5 F (36.4 C), resp. rate 16, height 5' 4"  (1.626 m), weight 46.3 kg (102 lb), SpO2 100 %.Body mass index is 17.51 kg/m.  General Appearance: improved grooming   Eye Contact:  Fair  Speech:  Normal Rate  Volume:  Normal  Mood:  Depressed, but brighens upon approach  Affect:  Constricted , but does smile appropriately at times   Thought Process:  Linear   Orientation:  Full (Time, Place, and Person)  Thought Content:  Logical  Suicidal Thoughts:  No at this time denies suicidal ideations, denies any self injurious ideations , contracts for safety on the unit  Homicidal Thoughts:  No denies homicidal ideations   Memory:  Recent and remote grossly intact    Judgement:  Improving   Insight:   Improving   Psychomotor Activity:  Normal  Concentration:  Concentration: Good and Attention Span: Good  Recall:  Good  Fund of Knowledge:  Good  Language:  Good  Akathisia:  No  Handed:  Right  AIMS (if indicated):  Assets:  Desire for Improvement  ADL's:  Needs help  Cognition:  WNL  Sleep:  Number of Hours: 5   Assessment - at this time patient is partially improved compared to admission, but reports chronic depression related to her chronic medical illness and related physical limitations . Denies current suicidal ideations At this  time denies medication side effects and is tolerating Cymbalta trial well thus far. Patient is hoping to be able to go to an [facility] due to functional difficulties affecting her ability to function independently    Treatment Plan Summary: Encourage ongoing group and milieu participation to work on coping skills and symptom reduction    Continue Cymbalta 60 mgrs QDAY for depression , anxiety  Increase Trazodone 100 mg po QHS   PRN for sleep, may repeat x 1 if needed.  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 May repeat for 2 doses, will discontinue.  TNanci Pina FNP 04/29/2016, 2:25 PM

## 2016-04-30 NOTE — [facility] Group Notes (Signed)
[facility] Group Notes:  (Clinical Social Work)   @TODAY @   1:15-2:15PM  Summary of Progress/Problems:   The main focus of today's process group was to             1)  Discuss the importance of adding supports                       2)  Identify various supports that could be added for various needs                       3)  Talk about barriers to using supports  An emphasis was placed on using counselor, doctor, therapy groups, 12-step groups, and problem-specific support groups to expand supports.  Much detail was given by CSW, but mostly by other patients, on each of these types of professional supports.  The patient expressed full comprehension of the concepts presented, and agreed that there is a need to add more supports.  The patient stated little in group, was in pain and a little tearful and not very interactive.  Type of Therapy:  Process Group with Motivational Interviewing  Participation Level:  Minimal  Participation Quality:  Attentive  Affect:  Depressed and Tearful  Cognitive:  Appropriate  Insight:  Developing/Improving  Engagement in Therapy:  Distracted due to pain  Modes of Intervention:   Education, Support and Processing, Activity  , LCSW @TODAY @   4:33 PM

## 2016-04-30 NOTE — Progress Notes (Signed)
[facility] Group Notes:  (Nursing/MHT/Case Management/Adjunct)  Date:  04/30/2016  Time:  12:42 AM  Type of Therapy:  Psychoeducational Skills  Participation Level:  Minimal  Participation Quality:  Attentive  Affect:  Flat  Cognitive:  Lacking  Insight:  Lacking  Engagement in Group:  Developing/Improving  Modes of Intervention:  Education  Summary of Progress/Problems: The patient described her day as having been "average" but offered no further details. In terms of the theme for the day, her coping skill will be to go out and see a movie.   Tasmine Hipwell S 04/30/2016, 12:42 AM

## 2016-04-30 NOTE — Progress Notes (Signed)
Patient ID: Petrina Melby, female   DOB: [birth year], 55 y.o.   MRN: [id number]   Pt currently presents with a flat affect and depressed behavior. Pt reports to writer that their goal is to "get home so I can find my cat." Pt states "I did not have a good day, I found out that my sister doesn't want me to live with her too." Pt reports good sleep with current medication regimen, given extra dose of Trazodone. Pt interacts more with peers tonight.  Pt provided with medications per providers orders. Pt's labs and vitals were monitored throughout the night. Pt supported emotionally and encouraged to express concerns and questions. Pt educated on medications.  Pt's safety ensured with 15 minute and environmental checks. Pt currently denies SI/HI and A/V hallucinations. Pt verbally agrees to seek staff if SI/HI or A/VH occurs and to consult with staff before acting on any harmful thoughts. Will continue POC.

## 2016-04-30 NOTE — Progress Notes (Signed)
D Teya remains profoundly depressed. She shares her worries about where she wlill be discharged to...how she will get there...what will happen to her apt and her Belongings . She is angry tearful and scared. A She completed her daily assessment and on it she wrote   She denied SI today and she rated her depression, hopelessness and anxiety " 5/5/5", respectively. She is VERY preoccupied about where she will end op living and although she can verbalize that she realizes she NEEDS help, she is not reay , emotionally, to depend on others. R Safety in pale.

## 2016-04-30 NOTE — [facility] Group Notes (Signed)
[facility] Group Notes:  (Nursing/MHT/Case Management/Adjunct)  Date:  04/30/2016  Time:  1030  Type of Therapy:  Nurse Education  - healthy support systems  Participation Level:  Active  Participation Quality:  Attentive  Affect:  Blunted  Cognitive:  Alert  Insight:  Improving  Engagement in Group:  Engaged  Modes of Intervention:  Discussion, Education and Support  Summary of Progress/Problems: Patient attended and participated in group discussion.  Tara Brown [facility] 04/30/2016, 1100

## 2016-04-30 NOTE — Progress Notes (Signed)
[facility] MD Progress Note  04/30/2016 10:05 AM Tara Brown  MRN:  [id number]   Subjective: " Im ok. I feel the same way as I did yesterday. Im getting worse, I have a cat that I have had for 6 years and I hate to give her up. Lauren said she would know something by Monday. They helped me shower yesterday. There Is nothing to be done to get better, I cant bath myself, I cant put a brush through my hair, I cant do anything. During the group yesterday they were pretty good letters, but I didn't read my letter."   Objective: I have met with patient and have reviewed chart notes. Patient is a 55 year old female, known to our unit from a prior psychiatric admission . She presented for depression and a suicide attempt by overdosing on Ambien. Today she is observed lying in bed with sheets thrown over her head. She endorses much anxiety due to relocation and giving away her cat once placement is found. She continues to ruminate about her pain however not as much today. She was able to realize that she is isolating herself and is in a downward spiral of depression. She is encouraged to get up from the bed and go to the dayroom with her peers.  At this time reports ongoing depression 7/10 anxiety 6/10, with 0 being the least and 10 being the worse. Her anxiety has increased due to reasons sited above. She does denies suicidal ideations at this time. Patient going to some groups and her goal today is to learn to like herself and the way she looks. SHe plans to avoid looking at the mirror. No disruptive behaviors on unit. States sleep improving, appetite improving . Denies medication side effects.   Principal Problem: Major depressive disorder, recurrent severe without psychotic features ([place]) Diagnosis:   Patient Active Problem List   Diagnosis Date Noted  . Back pain [M54.9] 04/25/2016  . Major depressive disorder, recurrent severe without psychotic features ([place]) [F33.2] 04/24/2016  . Left foot infection  [L08.9] 12/06/2015  . Suicidal ideations [R45.851] 12/06/2015  . Rheumatoid arthritis ([place]) [M06.9] 12/06/2015  . Foot infection [L08.9] 12/05/2015  . Cellulitis of toe, left [L03.032]   . Chronic osteomyelitis ([place]) [M86.60] 09/07/2015  . Anemia, iron deficiency [D50.9]   . Essential hypertension [I10]   . Anemia of chronic disease [D63.8] 03/08/2015   Total Time spent with patient: 20 minutes   Past Psychiatric History: Please see H&P.   Past Medical History:  Past Medical History:  Diagnosis Date  . Arthritis   . Major depressive disorder, recurrent, severe without psychotic features ([place]) 03/11/2015    Past Surgical History:  Procedure Laterality Date  . AMPUTATION Left 12/07/2015   Procedure: AMPUTATION LEFT GREAT TOE;  Surgeon: Newt Minion, MD;  Location: [facility];  Service: Orthopedics;  Laterality: Left;  . FOOT SURGERY    . HAND SURGERY    . HIP FRACTURE SURGERY    . KNEE SURGERY    . NECK SURGERY     Family History:  Family History  Problem Relation Age of Onset  . Bipolar disorder Sister   . Suicidality Other   . Schizophrenia Other   . Stroke Neg Hx    Family Psychiatric  History:Please see H&P.  Social History:  History  Alcohol Use  . Yes    Comment: Rarely     History  Drug Use No    Social History   Social History  .  Marital status: Divorced    Spouse name: N/A  . Number of children: N/A  . Years of education: N/A   Social History Main Topics  . Smoking status: Never Smoker  . Smokeless tobacco: Never Used  . Alcohol use Yes     Comment: Rarely  . Drug use: No  . Sexual activity: No   Other Topics Concern  . None   Social History Narrative  . None   Additional Social History:   Sleep: Fair  Appetite:  Fair  Current Medications: Current Facility-Administered Medications  Medication Dose Route Frequency Provider Last Rate Last Dose  . alum & mag hydroxide-simeth (MAALOX/MYLANTA) 200-200-20 MG/5ML suspension 30 mL  30 mL Oral  Q4H PRN Patrecia Pour, NP      . calcium-vitamin D (OSCAL WITH D) 500-200 MG-UNIT per tablet 1 tablet  1 tablet Oral BID Patrecia Pour, NP   1 tablet at 04/30/16 [phone]  . DULoxetine (CYMBALTA) DR capsule 60 mg  60 mg Oral Daily Sueanne Margarita, MD   60 mg at 04/30/16 0858  . feeding supplement (ENSURE ENLIVE) (ENSURE ENLIVE) liquid 237 mL  237 mL Oral BID BM Myer Peer Cobos, MD   237 mL at 04/28/16 1500  . ferrous sulfate tablet 325 mg  325 mg Oral BID WC Ursula Alert, MD   325 mg at 04/30/16 0858  . folic acid (FOLVITE) tablet 1 mg  1 mg Oral Daily Patrecia Pour, NP   1 mg at 04/30/16 0858  . hydrALAZINE (APRESOLINE) tablet 10 mg  10 mg Oral Q8H PRN Kerrie Buffalo, NP      . lidocaine (LIDODERM) 5 % 2 patch  2 patch Transdermal Q24H Ursula Alert, MD   Stopped at 04/30/16 0057  . magnesium hydroxide (MILK OF MAGNESIA) suspension 30 mL  30 mL Oral Daily PRN Patrecia Pour, NP      . naproxen (NAPROSYN) tablet 375 mg  375 mg Oral BID PRN Ursula Alert, MD   375 mg at 04/27/16 1546  . predniSONE (DELTASONE) tablet 10 mg  10 mg Oral Daily Patrecia Pour, NP   10 mg at 04/30/16 0858  . traMADol (ULTRAM) tablet 100 mg  100 mg Oral QID Sueanne Margarita, MD   100 mg at 04/30/16 0858  . traZODone (DESYREL) tablet 100 mg  100 mg Oral QHS,MR X 1 Nanci Pina, FNP   100 mg at 04/29/16 2124    Lab Results:  No results found for this or any previous visit (from the past 48 hour(s)).  Blood Alcohol level:  Lab Results  Component Value Date   ETH <5 04/23/2016   ETH <5 35/59/7416    Metabolic Disorder Labs: No results found for: HGBA1C, MPG No results found for: PROLACTIN No results found for: CHOL, TRIG, HDL, CHOLHDL, VLDL, LDLCALC  Physical Findings: AIMS: Facial and Oral Movements Muscles of Facial Expression: None, normal Lips and Perioral Area: None, normal Jaw: None, normal Tongue: None, normal,Extremity Movements Upper (arms, wrists, hands, fingers): None, normal Lower (legs,  knees, ankles, toes): None, normal, Trunk Movements Neck, shoulders, hips: None, normal, Overall Severity Severity of abnormal movements (highest score from questions above): None, normal Incapacitation due to abnormal movements: None, normal Patient's awareness of abnormal movements (rate only patient's report): No Awareness, Dental Status Current problems with teeth and/or dentures?: No Does patient usually wear dentures?: No  CIWA:    COWS:     Musculoskeletal: Strength & Muscle Tone: normal  Gait & Station: walks slowly due to pain and history of toe amputation  Patient leans: N/A  Psychiatric Specialty Exam: Physical Exam  Nursing note and vitals reviewed.   Review of Systems  Musculoskeletal: Positive for back pain, joint pain and neck pain.  Psychiatric/Behavioral: Positive for depression. The patient is nervous/anxious.   All other systems reviewed and are negative. chronic pain and discomfort related to RA. Denies vomiting, no fever, no chills, no rash   Blood pressure 122/68, pulse 79, temperature 98.6 F (37 C), resp. rate 18, height 5' 4"  (1.626 m), weight 46.3 kg (102 lb), SpO2 100 %.Body mass index is 17.51 kg/m.  General Appearance: improved grooming   Eye Contact:  Fair  Speech:  Normal Rate  Volume:  Normal  Mood:  Depressed and anxious  Affect:  Constricted , but does smile appropriately at times   Thought Process:  Linear   Orientation:  Full (Time, Place, and Person)  Thought Content:  Logical  Suicidal Thoughts:  No at this time denies suicidal ideations, denies any self injurious ideations , contracts for safety on the unit  Homicidal Thoughts:  No denies homicidal ideations   Memory:  Recent and remote grossly intact    Judgement:  Improving   Insight:   Improving   Psychomotor Activity:  Normal  Concentration:  Concentration: Good and Attention Span: Good  Recall:  Good  Fund of Knowledge:  Good  Language:  Good  Akathisia:  No  Handed:  Right   AIMS (if indicated):     Assets:  Desire for Improvement  ADL's:  Needs help  Cognition:  WNL  Sleep:  Number of Hours: 6   Assessment - at this time patient is partially improved compared to admission, but reports chronic depression related to her chronic medical illness and related physical limitations . Denies current suicidal ideations At this time denies medication side effects and is tolerating Cymbalta trial well thus far. Patient is hoping to be able to go to an [employer] due to functional difficulties affecting her ability to function independently   Treatment Plan Summary: Encourage ongoing group and milieu participation to work on coping skills and symptom reduction    Continue Cymbalta 60 mgrs QDAY for depression , anxiety  Increase Trazodone 100 mg po QHS   PRN for sleep, may repeat x 1 if needed.  Continue Tramadol for pain management Continue Lidoderm patch for pain management  Continue Prednisone 10 mgrs QDAY for management of Autoimmune Disorder  Start Ativan 0.44m po once daily prn for anxiety and panic. May repeat for 2 doses, will discontinue 05/01/2016 173m TaNanci PinaFNP 04/30/2016, 10:05 AM

## 2016-04-30 NOTE — Progress Notes (Signed)
[facility] Group Notes:  (Nursing/MHT/Case Management/Adjunct)  Date:  04/30/2016  Time:  9:51 PM  Type of Therapy:  Psychoeducational Skills  Participation Level:  Active  Participation Quality:  Attentive  Affect:  Depressed  Cognitive:  Appropriate  Insight:  Good  Engagement in Group:  Engaged  Modes of Intervention:  Education  Summary of Progress/Problems: The patient expressed in group that she did not have a very good day. She states that she was given some bad news by her sister by telephone. In addition, she states that she learned of some bad news about a former patient and was thinking about her. Talking to her peers helped her to deal with her emotions. As for the theme of the day, her support system will consist of her sister.   Hazle Coca S 04/30/2016, 9:51 PM

## 2016-05-01 NOTE — Progress Notes (Signed)
Patient ID: Tara Brown, female   DOB: 08-20-1961, 55 y.o.   MRN: [id number] [facility] MD Progress Note  05/01/2016 3:22 PM Tara Brown  MRN:  [id number]   Subjective: patient reports ongoing depression, sadness, which she attributes to her functional limitations , financial limitations, making it very difficult for her to return to independent living arrangement . States " if I could afford somebody to come in even a couple of hours a day, I would be OK". She is also ruminative about her pet cat, which she has had for 6 years, and which she thinks she might have to give up if she moves to an Assisted Living situation . Denies medication side effects. Describes chronic pain and discomfort related to her arthritis.  Objective: I have met with patient and have reviewed chart notes.  Patient presents depressed, sad, but denies suicidal ideations and contracts for safety on the unit. Ruminative about her stressors, as above, and in particular about her pet cat . Responds partially to support, encouragement , validation of her concerns. No disruptive or agitated behaviors on unit, going to some groups. Staff reports affect remains constricted . She denies medication side effects.   Principal Problem: Major depressive disorder, recurrent severe without psychotic features ([place]) Diagnosis:   Patient Active Problem List   Diagnosis Date Noted  . Back pain [M54.9] 04/25/2016  . Major depressive disorder, recurrent severe without psychotic features ([place]) [F33.2] 04/24/2016  . Left foot infection [L08.9] 12/06/2015  . Suicidal ideations [R45.851] 12/06/2015  . Rheumatoid arthritis ([place]) [M06.9] 12/06/2015  . Foot infection [L08.9] 12/05/2015  . Cellulitis of toe, left [L03.032]   . Chronic osteomyelitis ([place]) [M86.60] 09/07/2015  . Anemia, iron deficiency [D50.9]   . Essential hypertension [I10]   . Anemia of chronic disease [D63.8] 03/08/2015   Total Time spent with patient: 20 minutes   Past  Psychiatric History: Please see H&P.   Past Medical History:  Past Medical History:  Diagnosis Date  . Arthritis   . Major depressive disorder, recurrent, severe without psychotic features ([place]) 03/11/2015    Past Surgical History:  Procedure Laterality Date  . AMPUTATION Left 12/07/2015   Procedure: AMPUTATION LEFT GREAT TOE;  Surgeon: Newt Minion, MD;  Location: [facility] ORS;  Service: Orthopedics;  Laterality: Left;  . FOOT SURGERY    . HAND SURGERY    . HIP FRACTURE SURGERY    . KNEE SURGERY    . NECK SURGERY     Family History:  Family History  Problem Relation Age of Onset  . Bipolar disorder Sister   . Suicidality Other   . Schizophrenia Other   . Stroke Neg Hx    Family Psychiatric  History:Please see H&P.  Social History:  History  Alcohol Use  . Yes    Comment: Rarely     History  Drug Use No    Social History   Social History  . Marital status: Divorced    Spouse name: N/A  . Number of children: N/A  . Years of education: N/A   Social History Main Topics  . Smoking status: Never Smoker  . Smokeless tobacco: Never Used  . Alcohol use Yes     Comment: Rarely  . Drug use: No  . Sexual activity: No   Other Topics Concern  . None   Social History Narrative  . None   Additional Social History:   Sleep: improved   Appetite:  Fair  Current Medications: Current Facility-Administered Medications  Medication  Dose Route Frequency Provider Last Rate Last Dose  . alum & mag hydroxide-simeth (MAALOX/MYLANTA) 200-200-20 MG/5ML suspension 30 mL  30 mL Oral Q4H PRN Patrecia Pour, NP      . calcium-vitamin D (OSCAL WITH D) 500-200 MG-UNIT per tablet 1 tablet  1 tablet Oral BID Patrecia Pour, NP   1 tablet at 05/01/16 0818  . DULoxetine (CYMBALTA) DR capsule 60 mg  60 mg Oral Daily Sueanne Margarita, MD   60 mg at 05/01/16 0818  . feeding supplement (ENSURE ENLIVE) (ENSURE ENLIVE) liquid 237 mL  237 mL Oral BID BM Jenne Campus, MD   237 mL at 05/01/16 0821   . ferrous sulfate tablet 325 mg  325 mg Oral BID WC Ursula Alert, MD   325 mg at 05/01/16 0818  . folic acid (FOLVITE) tablet 1 mg  1 mg Oral Daily Patrecia Pour, NP   1 mg at 05/01/16 0818  . hydrALAZINE (APRESOLINE) tablet 10 mg  10 mg Oral Q8H PRN Kerrie Buffalo, NP      . lidocaine (LIDODERM) 5 % 2 patch  2 patch Transdermal Q24H Ursula Alert, MD   2 patch at 05/01/16 1219  . magnesium hydroxide (MILK OF MAGNESIA) suspension 30 mL  30 mL Oral Daily PRN Patrecia Pour, NP      . naproxen (NAPROSYN) tablet 375 mg  375 mg Oral BID PRN Ursula Alert, MD   375 mg at 04/27/16 1546  . predniSONE (DELTASONE) tablet 10 mg  10 mg Oral Daily Patrecia Pour, NP   10 mg at 05/01/16 0818  . traMADol (ULTRAM) tablet 100 mg  100 mg Oral QID Sueanne Margarita, MD   100 mg at 05/01/16 1219  . traZODone (DESYREL) tablet 100 mg  100 mg Oral QHS,MR X 1 Nanci Pina, FNP   100 mg at 04/30/16 2249    Lab Results:  No results found for this or any previous visit (from the past 48 hour(s)).  Blood Alcohol level:  Lab Results  Component Value Date   ETH <5 04/23/2016   ETH <5 47/42/5956    Metabolic Disorder Labs: No results found for: HGBA1C, MPG No results found for: PROLACTIN No results found for: CHOL, TRIG, HDL, CHOLHDL, VLDL, LDLCALC  Physical Findings: AIMS: Facial and Oral Movements Muscles of Facial Expression: None, normal Lips and Perioral Area: None, normal Jaw: None, normal Tongue: None, normal,Extremity Movements Upper (arms, wrists, hands, fingers): None, normal Lower (legs, knees, ankles, toes): None, normal, Trunk Movements Neck, shoulders, hips: None, normal, Overall Severity Severity of abnormal movements (highest score from questions above): None, normal Incapacitation due to abnormal movements: None, normal Patient's awareness of abnormal movements (rate only patient's report): No Awareness, Dental Status Current problems with teeth and/or dentures?: No Does patient  usually wear dentures?: No  CIWA:    COWS:     Musculoskeletal: Strength & Muscle Tone: normal  Gait & Station: walks slowly due to pain and history of toe amputation  Patient leans: N/A  Psychiatric Specialty Exam: Physical Exam  Nursing note and vitals reviewed.   Review of Systems  Musculoskeletal: Positive for back pain, joint pain and neck pain.  Psychiatric/Behavioral: Positive for depression. The patient is nervous/anxious.   All other systems reviewed and are negative. chronic pain and discomfort related to RA. Denies vomiting, no fever, no chills, no rash   Blood pressure 113/80, pulse 79, temperature 98.6 F (37 C), temperature source Oral, resp. rate 16,  height 5' 4"  (1.626 m), weight 102 lb (46.3 kg), SpO2 100 %.Body mass index is 17.51 kg/m.  General Appearance: improved grooming   Eye Contact:  Improved   Speech:  Normal Rate  Volume:  Normal  Mood: remains depressed   Affect:  Constricted, sad   Thought Process:  Linear   Orientation:  Full (Time, Place, and Person)  Thought Content:  Logical  Suicidal Thoughts:  No at this time denies suicidal ideations, denies any self injurious ideations , contracts for safety on the unit  Homicidal Thoughts:  No denies homicidal or violent  ideations   Memory:  Recent and remote grossly intact    Judgement:  Improving   Insight:   Improving   Psychomotor Activity:  Normal  Concentration:  Concentration: Good and Attention Span: Good  Recall:  Good  Fund of Knowledge:  Good  Language:  Good  Akathisia:  No  Handed:  Right  AIMS (if indicated):     Assets:  Desire for Improvement  ADL's:  Needs help  Cognition:  WNL  Sleep:  Number of Hours: 6.75   Assessment - patient remains depressed, sad, and attributes this in part to concern about her cat, and ongoing apprehension, frustration of transitioning from independent living situation to an ALF, due to worsening RA. Denies medication side effects . Denies suicidal  ideations. Partially responsive to support, encouragement, empathy .  Treatment Plan Summary: Encourage ongoing group and milieu participation to work on coping skills and symptom reduction   Continue Cymbalta 60 mgrs QDAY for depression , anxiety  Continue  Trazodone 100 mg  QHS   PRN for sleep, may repeat x 1 if needed.  Continue Tramadol for pain management Continue Lidoderm patch for pain management  Continue Prednisone 10 mgrs QDAY for management of Autoimmune Disorder  Treatment team working on disposition planning  Neita Garnet, MD 05/01/2016, 3:22 PM

## 2016-05-01 NOTE — Progress Notes (Signed)
Patient ID: Tara Brown, female   DOB: 1960/11/23, 55 y.o.   MRN: [id number] D: Client visible on the unit, seen in dayroom, interacts appropriately. Client reports depression "5" of 10. Client reports she will be "going to a skilled nursing facility, I guess" Client shrugs shoulders and say "I don't have any choice, I reckon" A: Writer provided emotional support, encouraged client to consider how  helpful it will be to have some assistance.  Medications reviewed administered as ordered. Staff will monitor q60min for safety. R: Client is safe on the unit, attended group.

## 2016-05-01 NOTE — Progress Notes (Signed)
Recreation Therapy Notes  Date: 05/01/16 Time: 0930 Location: [address] Group Room  Group Topic: Stress Management  Goal Area(s) Addresses:  Patient will verbalize importance of using healthy stress management.  Patient will identify positive emotions associated with healthy stress management.   Behavioral Response: Engaged  Intervention: Stress Management  Activity :  Progressive Muscle Relaxation.  LRT introduced the stress management technique of progressive muscle relaxation to the patients.  Patients were to follow along as LRT read script to engaged in the technique.  Education:  Stress Management, Discharge Planning.   Education Outcome: Acknowledges edcuation/In group clarification offered/Needs additional education  Clinical Observations/Feedback: Pt attended group.   Caroll Rancher, LRT/CTRS         Caroll Rancher A 05/01/2016 12:59 PM

## 2016-05-01 NOTE — [facility] Group Notes (Signed)
[facility] LCSW Group Therapy  05/01/2016 1:15pm  Type of Therapy:  Group Therapy vercoming Obstacles  Participation Level:  Reserved   Participation Quality:  Appropriate   Affect:  Appropriate  Cognitive:  Appropriate and Oriented  Insight:  Developing/Improving and Improving  Engagement in Therapy:  Improving  Modes of Intervention:  Discussion, Exploration, Problem-solving and Support  Description of Group:   In this group patients will be encouraged to explore what they see as obstacles to their own wellness and recovery. They will be guided to discuss their thoughts, feelings, and behaviors related to these obstacles. The group will process together ways to cope with barriers, with attention given to specific choices patients can make. Each patient will be challenged to identify changes they are motivated to make in order to overcome their obstacles. This group will be process-oriented, with patients participating in exploration of their own experiences as well as giving and receiving support and challenge from other group members.  Summary of Patient Progress: Although Pt participated minimally in group discussion she did identify that her arthritis was an abtacle for her as she is unable adequately complete ADL's. She reports feeling as if she is grieving her independence.    Therapeutic Modalities:   Cognitive Behavioral Therapy Solution Focused Therapy Motivational Interviewing Relapse Prevention Therapy   Chad Cordial, LCSWA 05/01/2016 3:09 PM

## 2016-05-01 NOTE — Progress Notes (Signed)
D: Pt presented with flat affect and depressed mood. Pt rated depression 5/10. Pt denied suicidal thoughts and verbally contracted for safety. Pt appeared sad this morning during shift assessment and continues to worry about her cat who is wandering the streets. Pt stated that she lives alone and cannot live with her sister. Pt requires assistance with ADL's and often depends on her family for help. Pt requesting to be discharged home alone today to go look for her cat. Treatment team made aware. A: Medications reviewed with pt. Medications administered as ordered per MD. Verbal support provided. Pt encouraged to attend groups. 15 minute checks performed for safety. Pt aware that staff is available to assist with ADLs.  R: Pt receptive to tx. Pt verbalizes understanding of med regimen.

## 2016-05-02 MED ORDER — DOCUSATE SODIUM 100 MG PO CAPS
100.0000 mg | ORAL_CAPSULE | Freq: Every day | ORAL | Status: DC
  Administered 2016-05-02 – 2016-05-11 (×10): 100 mg via ORAL
  Filled 2016-05-02 (×12): qty 1

## 2016-05-02 NOTE — Progress Notes (Signed)
DAR NOTE: Patient presents with anxious affect and depressed mood.  Denies pain, auditory and visual hallucinations.  Rates depression at 5, hopelessness at 5, and anxiety at 5.  Maintained on routine safety checks.  Medications given as prescribed.  Support and encouragement offered as needed.  Attended group and participated.  States goal for today is "D/C.  Patient observed socializing with peers in the dayroom.  Offered no complaint.

## 2016-05-02 NOTE — [facility] Group Notes (Signed)
[facility] LCSW Group Therapy 05/02/2016 1:15 PM  Type of Therapy: Group Therapy- Feelings about Diagnosis  Participation Level: Reserved  Participation Quality:  Appropriate  Affect:  Appropriate  Cognitive: Alert and Oriented   Insight:  Developing   Engagement in Therapy: Developing/Improving and Engaged   Modes of Intervention: Clarification, Confrontation, Discussion, Education, Exploration, Limit-setting, Orientation, Problem-solving, Rapport Building, Dance movement psychotherapist, Socialization and Support  Description of Group:   This group will allow patients to explore their thoughts and feelings about diagnoses they have received. Patients will be guided to explore their level of understanding and acceptance of these diagnoses. Facilitator will encourage patients to process their thoughts and feelings about the reactions of others to their diagnosis, and will guide patients in identifying ways to discuss their diagnosis with significant others in their lives. This group will be process-oriented, with patients participating in exploration of their own experiences as well as giving and receiving support and challenge from other group members.  Summary of Progress/Problems:  Pt continues to be more introspective and require prompting to participate. Pt discussed that she is a good listener, however she has trouble drawing boundaries for her wellness.   Therapeutic Modalities:   Cognitive Behavioral Therapy Solution Focused Therapy Motivational Interviewing Relapse Prevention Therapy  Chad Cordial, LCSWA 05/02/2016 3:37 PM

## 2016-05-02 NOTE — Progress Notes (Signed)
Adult Psychoeducational Group Note  Date:  05/02/2016 Time:  9:15 PM  Group Topic/Focus:  Wrap-Up Group:   The focus of this group is to help patients review their daily goal of treatment and discuss progress on daily workbooks.   Participation Level:  Active  Participation Quality:  Appropriate  Affect:  Appropriate  Cognitive:  Alert  Insight: Appropriate  Engagement in Group:  Engaged  Modes of Intervention:  Discussion  Additional Comments:  Patient states, "my day was okay". Patient also states she talked with the doctor and he is suppose to find an assistant living place for her. Jada Kuhnert L Mimie Goering 05/02/2016, 9:15 PM

## 2016-05-02 NOTE — Progress Notes (Signed)
Adult Psychoeducational Group Note  Date:  05/02/2016 Time:  0900 am  Group Topic/Focus:  Recovery Goals:   The focus of this group is to identify appropriate goals for recovery and establish a plan to achieve them.   Participation Level:  Active  Participation Quality:  Appropriate  Affect:  Appropriate  Cognitive:  Appropriate  Insight: Appropriate  Engagement in Group:  Engaged  Modes of Intervention:  Discussion and Education  Additional Comments:  Recovery goal "get assistance with ADL's, possible assistant living home. I probably won't eat once I get there".  Danissa Rundle L 05/02/2016, 9:37 AM

## 2016-05-02 NOTE — Progress Notes (Signed)
Patient ID: [name], female   DOB: [birth year], 56 y.o.   MRN: [id number] PER STATE REGULATIONS 482.30  THIS CHART WAS REVIEWED FOR MEDICAL NECESSITY WITH RESPECT TO THE PATIENT'S ADMISSION/ DURATION OF STAY.  NEXT REVIEW DATE: 05/06/2016  Willa Rough, RN, BSN CASE MANAGER

## 2016-05-02 NOTE — Progress Notes (Signed)
[facility] Group Notes:  (Nursing/MHT/Case Management/Adjunct)  Date:  05/02/2016  Time:  12:17 AM  Type of Therapy:  Psychoeducational Skills  Participation Level:  Minimal  Participation Quality:  Attentive  Affect:  Depressed  Cognitive:  Appropriate  Insight:  Lacking  Engagement in Group:  Lacking  Modes of Intervention:  Education  Summary of Progress/Problems: The patient stated that she had an average day but did not go into further detail. As for the theme of the day, her wellness strategy will be to eat more.   Hazle Coca S 05/02/2016, 12:17 AM

## 2016-05-02 NOTE — Progress Notes (Signed)
Patient ID: Tara Brown, female   DOB: 07-28-1961, 55 y.o.   MRN: [id number] D: Client visible on the unit, seen in unit watching TV. Client reports she spoke with SW today. Client reports plans to make sis POA. She is looking into ALF. Client reports mild constipation.  A: Writer provided emotional support, encouraged client to speak with SW about resources. Staff with Dianna Limbo PA, received order for Docusate stool softener. (see MAR) Medications reviewed, administered as ordered. Staff will monitor q74min for safety. R: Client is safe on the unit, attended group.

## 2016-05-02 NOTE — Progress Notes (Signed)
Patient ID: Tara Brown, female   DOB: 04-10-1961, 55 y.o.   MRN: [id number] [facility] MD Progress Note  05/02/2016 4:25 PM Tara Brown  MRN:  [id number]   Subjective:reports ongoing sense of sadness, loss and frustration regarding her progressive RA and how it has affected her ability to function independently. States " I hate to have to go to a facility " ( referring to ALF ) " but I reckon I have to ". She is also ruminative about her pet cat , which she has had for several years and with which she is very attached . States that if she does go into a ALF, she may decide to have her pet put down, because " nobody is going to want her "  Denies medication side effects.  Objective: I have met with patient and have reviewed chart notes.  Continues to present depressed , sad , ruminative, but does smile appropriately at times, and is future oriented . Today I met with patient along with CSW, and focused on disposition planning. Although reporting unhappiness about it, patient wants to proceed with initiating a ALF referral. States " I know it will take a while to process, in the meantime I think I can stay with my sister for a little bit ". Denies medication side effects. Some group participation, no disruptive or agitated behaviors on unit    Principal Problem: Major depressive disorder, recurrent severe without psychotic features ([place]) Diagnosis:   Patient Active Problem List   Diagnosis Date Noted  . Back pain [M54.9] 04/25/2016  . Major depressive disorder, recurrent severe without psychotic features ([place]) [F33.2] 04/24/2016  . Left foot infection [L08.9] 12/06/2015  . Suicidal ideations [R45.851] 12/06/2015  . Rheumatoid arthritis ([place]) [M06.9] 12/06/2015  . Foot infection [L08.9] 12/05/2015  . Cellulitis of toe, left [L03.032]   . Chronic osteomyelitis ([place]) [M86.60] 09/07/2015  . Anemia, iron deficiency [D50.9]   . Essential hypertension [I10]   . Anemia of chronic disease [D63.8]  03/08/2015   Total Time spent with patient: 20 minutes   Past Psychiatric History: Please see H&P.   Past Medical History:  Past Medical History:  Diagnosis Date  . Arthritis   . Major depressive disorder, recurrent, severe without psychotic features ([place]) 03/11/2015    Past Surgical History:  Procedure Laterality Date  . AMPUTATION Left 12/07/2015   Procedure: AMPUTATION LEFT GREAT TOE;  Surgeon: Newt Minion, MD;  Location: [facility];  Service: Orthopedics;  Laterality: Left;  . FOOT SURGERY    . HAND SURGERY    . HIP FRACTURE SURGERY    . KNEE SURGERY    . NECK SURGERY     Family History:  Family History  Problem Relation Age of Onset  . Bipolar disorder Sister   . Suicidality Other   . Schizophrenia Other   . Stroke Neg Hx    Family Psychiatric  History:Please see H&P.  Social History:  History  Alcohol Use  . Yes    Comment: Rarely     History  Drug Use No    Social History   Social History  . Marital status: Divorced    Spouse name: N/A  . Number of children: N/A  . Years of education: N/A   Social History Main Topics  . Smoking status: Never Smoker  . Smokeless tobacco: Never Used  . Alcohol use Yes     Comment: Rarely  . Drug use: No  . Sexual activity: No   Other Topics Concern  .  None   Social History Narrative  . None   Additional Social History:   Sleep: improved   Appetite:  Fair  Current Medications: Current Facility-Administered Medications  Medication Dose Route Frequency Provider Last Rate Last Dose  . alum & mag hydroxide-simeth (MAALOX/MYLANTA) 200-200-20 MG/5ML suspension 30 mL  30 mL Oral Q4H PRN Patrecia Pour, NP      . calcium-vitamin D (OSCAL WITH D) 500-200 MG-UNIT per tablet 1 tablet  1 tablet Oral BID Patrecia Pour, NP   1 tablet at 05/02/16 [phone]  . DULoxetine (CYMBALTA) DR capsule 60 mg  60 mg Oral Daily Sueanne Margarita, MD   60 mg at 05/02/16 0843  . feeding supplement (ENSURE ENLIVE) (ENSURE ENLIVE) liquid 237 mL   237 mL Oral BID BM Myer Peer Cobos, MD   237 mL at 05/02/16 0842  . ferrous sulfate tablet 325 mg  325 mg Oral BID WC Ursula Alert, MD   325 mg at 05/02/16 0843  . folic acid (FOLVITE) tablet 1 mg  1 mg Oral Daily Patrecia Pour, NP   1 mg at 05/02/16 0843  . hydrALAZINE (APRESOLINE) tablet 10 mg  10 mg Oral Q8H PRN Kerrie Buffalo, NP      . lidocaine (LIDODERM) 5 % 2 patch  2 patch Transdermal Q24H Ursula Alert, MD   2 patch at 05/02/16 1123  . magnesium hydroxide (MILK OF MAGNESIA) suspension 30 mL  30 mL Oral Daily PRN Patrecia Pour, NP      . naproxen (NAPROSYN) tablet 375 mg  375 mg Oral BID PRN Ursula Alert, MD   375 mg at 04/27/16 1546  . predniSONE (DELTASONE) tablet 10 mg  10 mg Oral Daily Patrecia Pour, NP   10 mg at 05/02/16 0843  . traMADol (ULTRAM) tablet 100 mg  100 mg Oral QID Sueanne Margarita, MD   100 mg at 05/02/16 1123  . traZODone (DESYREL) tablet 100 mg  100 mg Oral QHS,MR X 1 Nanci Pina, FNP   100 mg at 05/01/16 2201    Lab Results:  No results found for this or any previous visit (from the past 48 hour(s)).  Blood Alcohol level:  Lab Results  Component Value Date   ETH <5 04/23/2016   ETH <5 44/96/7591    Metabolic Disorder Labs: No results found for: HGBA1C, MPG No results found for: PROLACTIN No results found for: CHOL, TRIG, HDL, CHOLHDL, VLDL, LDLCALC  Physical Findings: AIMS: Facial and Oral Movements Muscles of Facial Expression: None, normal Lips and Perioral Area: None, normal Jaw: None, normal Tongue: None, normal,Extremity Movements Upper (arms, wrists, hands, fingers): None, normal Lower (legs, knees, ankles, toes): None, normal, Trunk Movements Neck, shoulders, hips: None, normal, Overall Severity Severity of abnormal movements (highest score from questions above): None, normal Incapacitation due to abnormal movements: None, normal Patient's awareness of abnormal movements (rate only patient's report): No Awareness, Dental  Status Current problems with teeth and/or dentures?: No Does patient usually wear dentures?: No  CIWA:    COWS:     Musculoskeletal: Strength & Muscle Tone: normal  Gait & Station: walks slowly due to pain and history of toe amputation  Patient leans: N/A  Psychiatric Specialty Exam: Physical Exam  Nursing note and vitals reviewed.   Review of Systems  Musculoskeletal: Positive for back pain, joint pain and neck pain.  Psychiatric/Behavioral: Positive for depression. The patient is nervous/anxious.   All other systems reviewed and are negative. chronic pain  and discomfort related to RA. Denies vomiting, no fever, no chills, no rash   Blood pressure 114/77, pulse 74, temperature 98.7 F (37.1 C), temperature source Oral, resp. rate 16, height _0  (1.626 m), weight 102 lb (46.3 kg), SpO2 100 %.Body mass index is 17.51 kg/m.  General Appearance: improved grooming   Eye Contact:  Improved   Speech:  Normal Rate  Volume:  Normal  Mood: depressed   Affect:  Remains sad, but is less constricted , and smiles at times appropriately    Thought Process:  Linear   Orientation:  Full (Time, Place, and Person)  Thought Content:  Logical  Suicidal Thoughts:  No at this time denies suicidal ideations, denies any self injurious ideations , contracts for safety on the unit  Homicidal Thoughts:  No denies homicidal or violent  ideations   Memory:  Recent and remote grossly intact    Judgement:  Improving   Insight:   Improving   Psychomotor Activity:  Normal  Concentration:  Concentration: Good and Attention Span: Good  Recall:  Good  Fund of Knowledge:  Good  Language:  Good  Akathisia:  No  Handed:  Right  AIMS (if indicated):     Assets:  Desire for Improvement  ADL's:  Needs help  Cognition:  WNL  Sleep:  Number of Hours: 6.5   Assessment - patient remains sad, depressed and ruminative about her loss of independence related to progressing RA. She denies suicidal ideations at  this time, and is future oriented, focused on going to an ALF . Concerns about her pet cat , which she doubts she would be able to take to the ALF , are a major concern and source of sadness, apprehension for her at this time .  Treatment Plan Summary: Encourage ongoing group and milieu participation to work on coping skills and symptom reduction   Continue Cymbalta 60 mgrs QDAY for depression , anxiety  Continue  Trazodone 100 mg  QHS   PRN for sleep, may repeat x 1 if needed.  Continue Tramadol for pain management Continue Lidoderm patch for pain management  Continue Prednisone 10 mgrs QDAY for management of Autoimmune Disorder  Treatment team working on disposition planning  Neita Garnet, MD 05/02/2016, 4:25 PM

## 2016-05-03 NOTE — Progress Notes (Signed)
DAR NOTE: Patient presents with bright affect and jovial mood. Denies pain, auditory and visual hallucinations.  Rates depression at 2, hopelessness at 2, and anxiety at 2.  Maintained on routine safety checks.  Medications given as prescribed.  Support and encouragement offered as needed.  Patient observed socializing with peers in the dayroom.  Offered no complaint.

## 2016-05-03 NOTE — Progress Notes (Signed)
Patient ID: Kollins Fenter, female   DOB: 1960-09-17, 55 y.o.   MRN: [id number] [facility] MD Progress Note  05/03/2016 5:56 PM Elfida Shimada  MRN:  [id number]   Subjective: patient reports some improvement . At this time denies medication side effects . Less ruminative about her pet cat today .  Objective: I have met with patient and have reviewed chart notes.  Staff reports that patient presents with some improvement- better groomed, more sociable, more visible in day room. She has reported improving depression . As reviewed with patient and staff, her depression is chronic and situational - she reports a sense of frustration and sadness regarding gradual functional decline due to her RA, which she feels has gotten to a point where she will need to reluctantly give up her independent living and transition to an ALF . Patient states that she has had Home health aide services in the past, but that they were too expensive,and only came once a week.  Denies suicidal ideations .    Principal Problem: Major depressive disorder, recurrent severe without psychotic features ([place]) Diagnosis:   Patient Active Problem List   Diagnosis Date Noted  . Back pain [M54.9] 04/25/2016  . Major depressive disorder, recurrent severe without psychotic features ([place]) [F33.2] 04/24/2016  . Left foot infection [L08.9] 12/06/2015  . Suicidal ideations [R45.851] 12/06/2015  . Rheumatoid arthritis ([place]) [M06.9] 12/06/2015  . Foot infection [L08.9] 12/05/2015  . Cellulitis of toe, left [L03.032]   . Chronic osteomyelitis ([place]) [M86.60] 09/07/2015  . Anemia, iron deficiency [D50.9]   . Essential hypertension [I10]   . Anemia of chronic disease [D63.8] 03/08/2015   Total Time spent with patient: 20 minutes   Past Psychiatric History: Please see H&P.   Past Medical History:  Past Medical History:  Diagnosis Date  . Arthritis   . Major depressive disorder, recurrent, severe without psychotic features ([place])  03/11/2015    Past Surgical History:  Procedure Laterality Date  . AMPUTATION Left 12/07/2015   Procedure: AMPUTATION LEFT GREAT TOE;  Surgeon: Newt Minion, MD;  Location: [facility];  Service: Orthopedics;  Laterality: Left;  . FOOT SURGERY    . HAND SURGERY    . HIP FRACTURE SURGERY    . KNEE SURGERY    . NECK SURGERY     Family History:  Family History  Problem Relation Age of Onset  . Bipolar disorder Sister   . Suicidality Other   . Schizophrenia Other   . Stroke Neg Hx    Family Psychiatric  History:Please see H&P.  Social History:  History  Alcohol Use  . Yes    Comment: Rarely     History  Drug Use No    Social History   Social History  . Marital status: Divorced    Spouse name: N/A  . Number of children: N/A  . Years of education: N/A   Social History Main Topics  . Smoking status: Never Smoker  . Smokeless tobacco: Never Used  . Alcohol use Yes     Comment: Rarely  . Drug use: No  . Sexual activity: No   Other Topics Concern  . None   Social History Narrative  . None   Additional Social History:   Sleep: improved   Appetite:  Improving, still fair   Current Medications: Current Facility-Administered Medications  Medication Dose Route Frequency Provider Last Rate Last Dose  . alum & mag hydroxide-simeth (MAALOX/MYLANTA) 200-200-20 MG/5ML suspension 30 mL  30 mL Oral Q4H PRN  Patrecia Pour, NP      . calcium-vitamin D (OSCAL WITH D) 500-200 MG-UNIT per tablet 1 tablet  1 tablet Oral BID Patrecia Pour, NP   1 tablet at 05/03/16 [phone]  . docusate sodium (COLACE) capsule 100 mg  100 mg Oral Daily Laverle Hobby, [name]   100 mg at 05/03/16 0844  . DULoxetine (CYMBALTA) DR capsule 60 mg  60 mg Oral Daily Sueanne Margarita, MD   60 mg at 05/03/16 0844  . feeding supplement (ENSURE ENLIVE) (ENSURE ENLIVE) liquid 237 mL  237 mL Oral BID BM Myer Peer Kamil Hanigan, MD   237 mL at 05/03/16 0946  . ferrous sulfate tablet 325 mg  325 mg Oral BID WC Ursula Alert, MD    325 mg at 05/03/16 0844  . folic acid (FOLVITE) tablet 1 mg  1 mg Oral Daily Patrecia Pour, NP   1 mg at 05/03/16 0844  . hydrALAZINE (APRESOLINE) tablet 10 mg  10 mg Oral Q8H PRN Kerrie Buffalo, NP      . lidocaine (LIDODERM) 5 % 2 patch  2 patch Transdermal Q24H Ursula Alert, MD   2 patch at 05/03/16 1257  . magnesium hydroxide (MILK OF MAGNESIA) suspension 30 mL  30 mL Oral Daily PRN Patrecia Pour, NP      . naproxen (NAPROSYN) tablet 375 mg  375 mg Oral BID PRN Ursula Alert, MD   375 mg at 04/27/16 1546  . predniSONE (DELTASONE) tablet 10 mg  10 mg Oral Daily Patrecia Pour, NP   10 mg at 05/03/16 0844  . traMADol (ULTRAM) tablet 100 mg  100 mg Oral QID Sueanne Margarita, MD   100 mg at 05/03/16 1256  . traZODone (DESYREL) tablet 100 mg  100 mg Oral QHS,MR X 1 Nanci Pina, FNP   100 mg at 05/02/16 2230    Lab Results:  No results found for this or any previous visit (from the past 48 hour(s)).  Blood Alcohol level:  Lab Results  Component Value Date   ETH <5 04/23/2016   ETH <5 85/10/7739    Metabolic Disorder Labs: No results found for: HGBA1C, MPG No results found for: PROLACTIN No results found for: CHOL, TRIG, HDL, CHOLHDL, VLDL, LDLCALC  Physical Findings: AIMS: Facial and Oral Movements Muscles of Facial Expression: None, normal Lips and Perioral Area: None, normal Jaw: None, normal Tongue: None, normal,Extremity Movements Upper (arms, wrists, hands, fingers): None, normal Lower (legs, knees, ankles, toes): None, normal, Trunk Movements Neck, shoulders, hips: None, normal, Overall Severity Severity of abnormal movements (highest score from questions above): None, normal Incapacitation due to abnormal movements: None, normal Patient's awareness of abnormal movements (rate only patient's report): No Awareness, Dental Status Current problems with teeth and/or dentures?: No Does patient usually wear dentures?: No  CIWA:    COWS:      Musculoskeletal: Strength & Muscle Tone: normal  Gait & Station: walks slowly due to pain and history of toe amputation  Patient leans: N/A  Psychiatric Specialty Exam: Physical Exam  Nursing note and vitals reviewed.   Review of Systems  Musculoskeletal: Positive for back pain, joint pain and neck pain.  Psychiatric/Behavioral: Positive for depression. The patient is nervous/anxious.   All other systems reviewed and are negative. chronic pain and discomfort related to RA. Denies vomiting, no fever, no chills, no rash   Blood pressure 127/77, pulse 78, temperature 98.6 F (37 C), temperature source Oral, resp. rate 16, height 5'  4" (1.626 m), weight 102 lb (46.3 kg), SpO2 100 %.Body mass index is 17.51 kg/m.  General Appearance: improved grooming   Eye Contact:  Improved   Speech:  Normal Rate  Volume:  Normal  Mood: less depressed, improving gradually  Affect:   less constricted   Thought Process:  Linear   Orientation:  Full (Time, Place, and Person)  Thought Content:  Logical  Suicidal Thoughts:  No at this time denies suicidal ideations, denies any self injurious ideations , contracts for safety on the unit  Homicidal Thoughts:  No denies homicidal or violent  ideations   Memory:  Recent and remote grossly intact    Judgement:  Improving   Insight:   Improving   Psychomotor Activity:  Normal  Concentration:  Concentration: Good and Attention Span: Good  Recall:  Good  Fund of Knowledge:  Good  Language:  Good  Akathisia:  No  Handed:  Right  AIMS (if indicated):     Assets:  Desire for Improvement  ADL's:  Needs help  Cognition:  WNL  Sleep:  Number of Hours: 6.25   Assessment - patient's mood and affect have improved partially , and is less depressed, less ruminative today. Reports chronic depression in relation to her severe autoimmune disease and functional decline . At this time denies any SI. States she does not like the idea of going to an ALF , but states "  I think I am going to have to".  CSW working on paperwork, PASSR process to refer to ALF .  Treatment Plan Summary: Encourage ongoing group and milieu participation to work on coping skills and symptom reduction   Continue Cymbalta 60 mgrs QDAY for depression , anxiety  Continue  Trazodone 100 mg  QHS   PRN for sleep, may repeat x 1 if needed.  Continue Tramadol for pain management Continue Lidoderm patch for pain management  Continue Prednisone 10 mgrs QDAY for management of Autoimmune Disorder  Treatment team working on disposition planning  Will re-consult OT / PT  Neita Garnet, MD 05/03/2016, 5:56 PM

## 2016-05-03 NOTE — Progress Notes (Signed)
Patient ID: Tara Brown, female   DOB: 1960/11/08, 55 y.o.   MRN: [id number] D: Client is visible on the unit, ambivalent today, shrugs shoulders, says of her day "all right I reckon" A: Writer encourages client to verbalize concerns. Provided emotional support. Medications reviewed, administered as ordered. Staff will monitor q13min for safety. R: Client is safe on the unit, attended group.

## 2016-05-03 NOTE — Progress Notes (Signed)
PASRR process started and awaiting screener to come assess Pt; CSW will attempt to find placement at ALF in [facility]   Chad Cordial, LCSW Clinical Social Work [phone]

## 2016-05-03 NOTE — [facility] Group Notes (Signed)
[facility] LCSW Group Therapy 05/03/2016 1:15 PM  Type of Therapy: Group Therapy- Emotion Regulation  Pt did not attend, declined invitation.   Chad Cordial, LCSWA 05/03/2016 4:08 PM

## 2016-05-03 NOTE — Progress Notes (Signed)
Adult Psychoeducational Group Note  Date:  05/03/2016 Time:  8:42 PM  Group Topic/Focus:  Wrap-Up Group:   The focus of this group is to help patients review their daily goal of treatment and discuss progress on daily workbooks.   Participation Level:  Active  Participation Quality:  Appropriate  Affect:  Appropriate  Cognitive:  Alert  Insight: Appropriate  Engagement in Group:  Engaged  Modes of Intervention:  Discussion  Additional Comments:  Patient attended wrap up group, but did not want to say anything.  Nyssa Sayegh L Hina Gupta 05/03/2016, 8:42 PM

## 2016-05-03 NOTE — Tx Team (Signed)
Interdisciplinary Treatment Plan Update (Adult) Date: 05/03/2016   Date: 05/03/2016 3:53 PM  Progress in Treatment:  Attending groups: Intermittently Participating in groups: Minimally Taking medication as prescribed: Yes  Tolerating medication: Yes  Family/Significant othe contact made: Yes with sister and father Patient understands diagnosis: Yes AEB seeking help with depression Discussing patient identified problems/goals with staff: Yes  Medical problems stabilized or resolved: Yes  Denies suicidal/homicidal ideation: Yes Patient has not harmed self or Others: Yes   New problem(s) identified: None identified at this time.   Discharge Plan or Barriers: CSW will assess for appropriate discharge plan and relevant barriers.   04/28/16: Pt will return home and follow-up with outpatient services.   05/03/16: ALF placement started; PASRR pending; FL2 created and CSW will begin seeking placement in [facility].  Additional comments:  Patient and CSW reviewed pt's identified goals and treatment plan. Patient verbalized understanding and agreed to treatment plan.   Reason for Continuation of Hospitalization:  Depression Medication stabilization Suicidal ideation  Estimated length of stay: 2-3 days  Review of initial/current patient goals per problem list:   1.  Goal(s): Patient will participate in aftercare plan  Met:  Yes  Target date: 3-5 days from date of admission   As evidenced by: Patient will participate within aftercare plan AEB aftercare provider and housing plan at discharge being identified.  04/25/16: CSW to work with Pt to assess for appropriate discharge plan and faciliate appointments and referrals as needed prior to d/c. 04/28/16: Pt will return home and follow-up with outpatient services.  05/03/16: ALF placement started; PASRR pending; FL2 created and CSW will begin seeking placement in [facility]F.  2.  Goal (s): Patient will exhibit decreased  depressive symptoms and suicidal ideations.  Met:  Progressing   Target date: 3-5 days from date of admission   As evidenced by: Patient will utilize self rating of depression at 3 or below and demonstrate decreased signs of depression or be deemed stable for discharge by MD. 04/25/16: Pt was admitted with symptoms of depression, rating 10/10. Pt continues to present with flat affect and depressive symptoms.  Pt will demonstrate decreased symptoms of depression and rate depression at 3/10 or lower prior to discharge. 04/28/16: Pt rates depression at 5/10; denies SI 05/03/16: Pt rates depression at 5/10; denies SI  Attendees:  Patient:    Family:    Physician: Dr. Parke Poisson, MD  05/03/2016 3:53 PM  Nursing: Sandre Kitty, RN; Eulogio Bear, RN 05/03/2016 3:53 PM  Clinical Social Worker Peri Maris, [place] 05/03/2016 3:53 PM  Other: Tilden Fossa, LCSWA 05/03/2016 3:53 PM  Clinical: Lars Pinks, RN Case manager  05/03/2016 3:53 PM  Other:  05/03/2016 3:53 PM  Other:     Peri Maris, [place] Social Work [phone]

## 2016-05-03 NOTE — Progress Notes (Signed)
Recreation Therapy Notes  Date: 05/03/16 Time: 0930 Location: 300 Hall Group Room  Group Topic: Stress Management  Goal Area(s) Addresses:  Patient will verbalize importance of using healthy stress management.  Patient will identify positive emotions associated with healthy stress management.   Intervention: Stress Management  Activity :  Floating on a Cloud.  LRT introduced the technique of guided imagery to the patients.  Patients were asked to follow along as LRT read the script to participate in the activity.  Education: Stress Management, Discharge Planning.   Education Outcome: Acknowledges edcuation/In group clarification offered/Needs additional education  Clinical Observations/Feedback:  Pt did not attend group.       Caroll Rancher, LRT/CTRS    Caroll Rancher A 05/03/2016 12:15 PM

## 2016-05-04 MED ORDER — TRAZODONE HCL 100 MG PO TABS
100.0000 mg | ORAL_TABLET | Freq: Every evening | ORAL | Status: DC | PRN
  Administered 2016-05-04 – 2016-05-14 (×11): 100 mg via ORAL
  Filled 2016-05-04: qty 7
  Filled 2016-05-04 (×11): qty 1

## 2016-05-04 MED ORDER — TRAMADOL HCL 50 MG PO TABS
100.0000 mg | ORAL_TABLET | Freq: Four times a day (QID) | ORAL | Status: DC | PRN
  Administered 2016-05-04 – 2016-05-05 (×3): 100 mg via ORAL
  Filled 2016-05-04 (×3): qty 2

## 2016-05-04 MED ORDER — LORAZEPAM 0.5 MG PO TABS
0.5000 mg | ORAL_TABLET | Freq: Once | ORAL | Status: AC
  Administered 2016-05-04: 0.5 mg via ORAL
  Filled 2016-05-04: qty 1

## 2016-05-04 NOTE — [facility] Group Notes (Signed)
[facility] Group Notes:  (Nursing/MHT/Case Management/Adjunct)  Date:  05/04/2016  Time:  2:39 PM  Type of Therapy:  Nurse Education  Participation Level:  Did Not Attend   Mickie Bail 05/04/2016, 2:39 PM

## 2016-05-04 NOTE — Progress Notes (Signed)
OT Cancellation Note  Patient Details Name: Tara Brown MRN: [id number] DOB: Dec 29, 1960   Cancelled Treatment:    Reason Eval/Treat Not Completed: Other (comment) -- Received new OT order this date. Patient was evaluated by OT a few days ago; please see previous note, which states that all OT needs had been addressed at that time. PT plans to see pt today and will have them ask patient if she has any other OT needs at this time. Will check back tomorrow.  Joshaua Epple A 05/04/2016, 12:55 PM

## 2016-05-04 NOTE — Progress Notes (Signed)
DAR NOTE: Patient presents with flat affect and depressed mood.  Denies auditory and visual hallucinations.  Rates depression at 5, hopelessness at 5, and anxiety at 5.  Described energy level as low and concentration as good.  Maintained on routine safety checks.  Medications given as prescribed.  Support and encouragement offered as needed.  States goal for today is "discharge."  Minimal interaction with staff and peers.  Patient was tearful and sad while in the dayroom.  Patient requested and received Ativan 0.5 mg for complain of anxiety and agitation with good effectin the dayroom.

## 2016-05-04 NOTE — Progress Notes (Signed)
Adult Psychoeducational Group Note  Date:  05/04/2016 Time:  9:42 PM  Group Topic/Focus:  Wrap-Up Group:   The focus of this group is to help patients review their daily goal of treatment and discuss progress on daily workbooks.   Participation Level:  Active  Participation Quality:  Appropriate  Affect:  Appropriate  Cognitive:  Alert  Insight: Appropriate  Engagement in Group:  Engaged  Modes of Intervention:  Discussion  Additional Comments:  Patient states, "the goal for today was to find placement". Camika Marsico L Granite Godman 05/04/2016, 9:42 PM

## 2016-05-04 NOTE — [facility] Group Notes (Signed)
[facility] Mental [facility] Association Group Therapy 05/04/2016 1:15pm  Type of Therapy: Mental Health Association Presentation  Participation Level: Active  Participation Quality: Attentive  Affect: Appropriate  Cognitive: Oriented  Insight: Developing/Improving  Engagement in Therapy: Engaged  Modes of Intervention: Discussion, Education and Socialization  Summary of Progress/Problems: Mental Health Association (MHA) Speaker came to talk about his personal journey with substance abuse and addiction. The pt processed ways by which to relate to the speaker. MHA speaker provided handouts and educational information pertaining to groups and services offered by the [facility]. Pt was engaged in speaker's presentation and was receptive to resources provided.    Chad Cordial, LCSWA 05/04/2016 1:58 PM

## 2016-05-04 NOTE — Progress Notes (Signed)
Patient ID: Tara Brown, female   DOB: Jul 18, 1961, 55 y.o.   MRN: [id number] [facility] MD Progress Note  05/04/2016 6:53 PM Tara Brown  MRN:  [id number]   Subjective: patient reports ongoing depression, although states she has improved compared to how she felt prior to admission , denies suicidal ideations , denies medication side effects Objective: I have met with patient and have reviewed chart notes.  Patient presents with a partially improved mood , but overall still constricted in affect, and vaguely irritable at times, which she attributes to episodic increased pain , associated with RA. At this time focus is on disposition planning- CSW working on disposition , initiating and continuing process of ALF referral. PASSR process has been started . Appreciate PT consult , recommendation is home health PT/OT assistance  Patient is going to some groups, no disruptive or agitated behaviors on unit. Denies medication side effects    Principal Problem: Major depressive disorder, recurrent severe without psychotic features ([place]) Diagnosis:   Patient Active Problem List   Diagnosis Date Noted  . Back pain [M54.9] 04/25/2016  . Major depressive disorder, recurrent severe without psychotic features ([place]) [F33.2] 04/24/2016  . Left foot infection [L08.9] 12/06/2015  . Suicidal ideations [R45.851] 12/06/2015  . Rheumatoid arthritis ([place]) [M06.9] 12/06/2015  . Foot infection [L08.9] 12/05/2015  . Cellulitis of toe, left [L03.032]   . Chronic osteomyelitis ([place]) [M86.60] 09/07/2015  . Anemia, iron deficiency [D50.9]   . Essential hypertension [I10]   . Anemia of chronic disease [D63.8] 03/08/2015   Total Time spent with patient: 20 minutes   Past Psychiatric History: Please see H&P.   Past Medical History:  Past Medical History:  Diagnosis Date  . Arthritis   . Major depressive disorder, recurrent, severe without psychotic features ([place]) 03/11/2015    Past Surgical History:   Procedure Laterality Date  . AMPUTATION Left 12/07/2015   Procedure: AMPUTATION LEFT GREAT TOE;  Surgeon: Newt Minion, MD;  Location: [facility] ORS;  Service: Orthopedics;  Laterality: Left;  . FOOT SURGERY    . HAND SURGERY    . HIP FRACTURE SURGERY    . KNEE SURGERY    . NECK SURGERY     Family History:  Family History  Problem Relation Age of Onset  . Bipolar disorder Sister   . Suicidality Other   . Schizophrenia Other   . Stroke Neg Hx    Family Psychiatric  History:Please see H&P.  Social History:  History  Alcohol Use  . Yes    Comment: Rarely     History  Drug Use No    Social History   Social History  . Marital status: Divorced    Spouse name: N/A  . Number of children: N/A  . Years of education: N/A   Social History Main Topics  . Smoking status: Never Smoker  . Smokeless tobacco: Never Used  . Alcohol use Yes     Comment: Rarely  . Drug use: No  . Sexual activity: No   Other Topics Concern  . None   Social History Narrative  . None   Additional Social History:   Sleep: improved   Appetite:  Improving, still fair   Current Medications: Current Facility-Administered Medications  Medication Dose Route Frequency Provider Last Rate Last Dose  . alum & mag hydroxide-simeth (MAALOX/MYLANTA) 200-200-20 MG/5ML suspension 30 mL  30 mL Oral Q4H PRN Patrecia Pour, NP      . calcium-vitamin D (OSCAL WITH D) 500-200 MG-UNIT per  tablet 1 tablet  1 tablet Oral BID Patrecia Pour, NP   1 tablet at 05/04/16 1716  . docusate sodium (COLACE) capsule 100 mg  100 mg Oral Daily Laverle Hobby, [name]   100 mg at 05/04/16 0830  . DULoxetine (CYMBALTA) DR capsule 60 mg  60 mg Oral Daily Sueanne Margarita, MD   60 mg at 05/04/16 0830  . feeding supplement (ENSURE ENLIVE) (ENSURE ENLIVE) liquid 237 mL  237 mL Oral BID BM Myer Peer Cobos, MD   237 mL at 05/04/16 1446  . ferrous sulfate tablet 325 mg  325 mg Oral BID WC Ursula Alert, MD   325 mg at 05/04/16 1716  . folic  acid (FOLVITE) tablet 1 mg  1 mg Oral Daily Patrecia Pour, NP   1 mg at 05/04/16 0830  . hydrALAZINE (APRESOLINE) tablet 10 mg  10 mg Oral Q8H PRN Kerrie Buffalo, NP      . lidocaine (LIDODERM) 5 % 2 patch  2 patch Transdermal Q24H Ursula Alert, MD   2 patch at 05/04/16 1203  . magnesium hydroxide (MILK OF MAGNESIA) suspension 30 mL  30 mL Oral Daily PRN Patrecia Pour, NP      . naproxen (NAPROSYN) tablet 375 mg  375 mg Oral BID PRN Ursula Alert, MD   375 mg at 04/27/16 1546  . predniSONE (DELTASONE) tablet 10 mg  10 mg Oral Daily Patrecia Pour, NP   10 mg at 05/04/16 0830  . traMADol (ULTRAM) tablet 100 mg  100 mg Oral Q6H PRN Jenne Campus, MD      . traZODone (DESYREL) tablet 100 mg  100 mg Oral QHS,MR X 1 Nanci Pina, FNP   100 mg at 05/03/16 2216    Lab Results:  No results found for this or any previous visit (from the past 48 hour(s)).  Blood Alcohol level:  Lab Results  Component Value Date   ETH <5 04/23/2016   ETH <5 59/74/1638    Metabolic Disorder Labs: No results found for: HGBA1C, MPG No results found for: PROLACTIN No results found for: CHOL, TRIG, HDL, CHOLHDL, VLDL, LDLCALC  Physical Findings: AIMS: Facial and Oral Movements Muscles of Facial Expression: None, normal Lips and Perioral Area: None, normal Jaw: None, normal Tongue: None, normal,Extremity Movements Upper (arms, wrists, hands, fingers): None, normal Lower (legs, knees, ankles, toes): None, normal, Trunk Movements Neck, shoulders, hips: None, normal, Overall Severity Severity of abnormal movements (highest score from questions above): None, normal Incapacitation due to abnormal movements: None, normal Patient's awareness of abnormal movements (rate only patient's report): No Awareness, Dental Status Current problems with teeth and/or dentures?: No Does patient usually wear dentures?: No  CIWA:    COWS:     Musculoskeletal: Strength & Muscle Tone: normal  Gait & Station: walks  slowly due to pain and history of toe amputation  Patient leans: N/A  Psychiatric Specialty Exam: Physical Exam  Nursing note and vitals reviewed.   Review of Systems  Musculoskeletal: Positive for back pain, joint pain and neck pain.  Psychiatric/Behavioral: Positive for depression. The patient is nervous/anxious.   All other systems reviewed and are negative. chronic pain and discomfort related to RA. Denies vomiting, no fever, no chills, no rash   Blood pressure 123/76, pulse 80, temperature 98.2 F (36.8 C), temperature source Oral, resp. rate 16, height 5' 4"  (1.626 m), weight 102 lb (46.3 kg), SpO2 100 %.Body mass index is 17.51 kg/m.  General Appearance: improved  grooming   Eye Contact:  Improved   Speech:  Normal Rate  Volume:  Normal  Mood:  remains depressed, but has improved compared to admission   Affect:   less constricted   Thought Process:  Linear   Orientation:  Full (Time, Place, and Person)  Thought Content:  Linear   Suicidal Thoughts:  No at this time denies suicidal ideations, denies any self injurious ideations , contracts for safety on the unit  Homicidal Thoughts:  No denies homicidal or violent  ideations   Memory:  Recent and remote grossly intact    Judgement:  Improving   Insight:   Improving   Psychomotor Activity:  Normal  Concentration:  Concentration: Good and Attention Span: Good  Recall:  Good  Fund of Knowledge:  Good  Language:  Good  Akathisia:  No  Handed:  Right  AIMS (if indicated):     Assets:  Desire for Improvement  ADL's:  Needs help  Cognition:  WNL  Sleep:  Number of Hours: 6   Assessment - although improved continues to experience depression, but denies any suicidal ideations. She is future oriented, and reluctantly wanting to move forward with ALF placement, as she states she would prefer to maintain independence, but realizes that ALF may be appropriate option for her . Reports hope  That she will be able to return home for a  period of time while ALF process proceeds , with home health services . Denies medication side effects.  Treatment Plan Summary: Encourage ongoing group and milieu participation to work on coping skills and symptom reduction   Continue Cymbalta 60 mgrs QDAY for depression , anxiety  Continue  Trazodone 100 mg  QHS   PRN for sleep as needed   Continue Tramadol for pain management, as needed  Continue Lidoderm patch for pain management  Continue Prednisone 10 mgrs QDAY for management of Autoimmune Disorder  Treatment team working on disposition planning Will re-consult OT / PT  Neita Garnet, MD 05/04/2016, 6:53 PM

## 2016-05-04 NOTE — Progress Notes (Signed)
D    Pt came to this writer requesting to have her medication times changed on her Ultram    It had been scheduled but the doctor had changed it to PRN   Explained to pt and offered to tell when to ask for it and how often she could have it   Pt then demanded staff talk to doctor and said the doctor was still here    Tried again to explain to patient that she was still getting the medication 4 times a day so her dose had not decreased and once again offered to instruct her on when to ask for medication since it was not scheduled    Pt walked away angry and said I dont have time for this    Doctor had already left for the day A   Verbal support offered   Medications offered   Education on medications   Q 15 min checks  R   Pt not receptive but is presently safe

## 2016-05-04 NOTE — Progress Notes (Signed)
Adult Psychoeducational Group Note  Date:  05/04/2016 Time:  11:50 AM  Group Topic/Focus:  Goals Group:   The focus of this group is to help patients establish daily goals to achieve during treatment and discuss how the patient can incorporate goal setting into their daily lives to aide in recovery.   Participation Level:  Active  Participation Quality:  Appropriate  Affect:  Appropriate  Cognitive:  Appropriate  Insight: Appropriate  Engagement in Group:  Improving  Modes of Intervention:  Discussion  Additional Comments:  Pt participated in group.  Pt states that she is nervous about going to a nursing home after she leaves here.  Pt did cry a little in group. Burtis Imhoff R Avacyn Kloosterman 05/04/2016, 11:50 AM

## 2016-05-04 NOTE — Evaluation (Addendum)
Physical Therapy Evaluation-1x eval Patient Details Name: Patrena Santalucia MRN: [id number] DOB: 1960-11-07 Today's Date: 05/04/2016   History of Present Illness  55 yo female admitted wot [facility] for major depressive disorder. Hx of RA, L great toe amputation, back pain.   Clinical Impression  On eval, pt mobilizing fairly well. She could potentially benefit from straight cane use however she would need help with modifications due to hand contractures. Pt reports she may d/c to ALF once discharged from [facility]. Recommend HHPT (general strength, endurance, higher level balance training) and HHOT (ADL education, adaptive equipment) follow up after discharge.  OT evaluation completed 8/16-HHOT recommended (please see eval from that date)    Follow Up Recommendations Home health PT; Home Health OT    Equipment Recommendations  None recommended by PT    Recommendations for Other Services       Precautions / Restrictions Precautions Precautions: Fall Restrictions Weight Bearing Restrictions: No      Mobility  Bed Mobility                  Transfers Overall transfer level: Modified independent                  Ambulation/Gait Ambulation/Gait assistance: Supervision; Mod Independent Ambulation Distance (Feet): 100 Feet   Gait Pattern/deviations: Antalgic;Decreased stride length;Step-through pattern Pt reports some difficulty due to prior R LE injury affecting lower leg. Noted some swelling as well.         Stairs            Wheelchair Mobility    Modified Rankin (Stroke Patients Only)       Balance Overall balance assessment: Needs assistance   Sitting balance-Leahy Scale: Normal         Standing balance comment: Had pt perform static standing EO/EC, withstanding external perturbations, narrow BOS-Mod Ind. 360 degree turn-increased time, supervision assist. Fast gait speed vs slow gait speed-supervision assist.              High level balance  activites: Sudden stops;Turns;Direction changes               Pertinent Vitals/Pain Pain Assessment: 0-10 Pain Score: 7  Pain Location: back/shoulder blade area Pain Descriptors / Indicators: Aching;Burning Pain Intervention(s): Monitored during session    Home Living Family/patient expects to be discharged to:: Unsure Living Arrangements: Alone Available Help at Discharge: Friend(s)           Home Equipment: Kasandra Knudsen - single point;Bedside commode;Shower seat Additional Comments: reacher, long sponge    Prior Function Level of Independence: Needs assistance               Hand Dominance        Extremity/Trunk Assessment   Upper Extremity Assessment:  (limited ROM bilaterally. bil hand contractures. Please see previous OT evaluation for specifics.)           Lower Extremity Assessment: Generalized weakness      Cervical / Trunk Assessment: Normal  Communication   Communication: No difficulties  Cognition Arousal/Alertness: Awake/alert Behavior During Therapy: WFL for tasks assessed/performed Overall Cognitive Status: Within Functional Limits for tasks assessed                      General Comments      Exercises        Assessment/Plan    PT Assessment All further PT needs can be met in the next venue of care (HHPT follow up after d/c from  [facility])  PT Diagnosis Acute pain;Generalized weakness   PT Problem List    PT Treatment Interventions     PT Goals (Current goals can be found in the Care Plan section) Acute Rehab PT Goals Patient Stated Goal: be able to wash hair, put shirt on more easily without hurting back and pick up heavier items that fall PT Goal Formulation: All assessment and education complete, DC therapy    Frequency     Barriers to discharge        Co-evaluation               End of Session   Activity Tolerance: Patient tolerated treatment well Patient left: in bed;with call bell/phone within reach       Functional Assessment Tool Used: clinical judgement Functional Limitation: Mobility: Walking and moving around Mobility: Walking and Moving Around Current Status (G8185): At least 1 percent but less than 20 percent impaired, limited or restricted Mobility: Walking and Moving Around Goal Status [phone]): At least 1 percent but less than 20 percent impaired, limited or restricted Mobility: Walking and Moving Around Discharge Status [phone]): At least 1 percent but less than 20 percent impaired, limited or restricted    Time: 7858-8502 PT Time Calculation (min) (ACUTE ONLY): 8 min   Charges:   PT Evaluation $PT Eval Low Complexity: 1 Procedure     PT G Codes:   PT G-Codes **NOT FOR INPATIENT CLASS** Functional Assessment Tool Used: clinical judgement Functional Limitation: Mobility: Walking and moving around Mobility: Walking and Moving Around Current Status (D7412): At least 1 percent but less than 20 percent impaired, limited or restricted Mobility: Walking and Moving Around Goal Status [phone]): At least 1 percent but less than 20 percent impaired, limited or restricted Mobility: Walking and Moving Around Discharge Status [phone]): At least 1 percent but less than 20 percent impaired, limited or restricted    Weston Anna, MPT Pager: [phone]

## 2016-05-05 MED ORDER — TRAMADOL HCL 50 MG PO TABS
100.0000 mg | ORAL_TABLET | Freq: Three times a day (TID) | ORAL | Status: DC
  Filled 2016-05-05: qty 2

## 2016-05-05 NOTE — [facility] Group Notes (Signed)
[facility] LCSW Group Therapy 05/05/2016 1:15pm  Type of Therapy: Group Therapy- Feelings Around Relapse and Recovery  Participation Level: Active   Participation Quality:  Appropriate  Affect:  Appropriate  Cognitive: Alert and Oriented   Insight:  Developing   Engagement in Therapy: Developing/Improving and Engaged   Modes of Intervention: Clarification, Confrontation, Discussion, Education, Exploration, Limit-setting, Orientation, Problem-solving, Rapport Building, Dance movement psychotherapist, Socialization and Support  Summary of Progress/Problems: The topic for today was feelings about relapse. The group discussed what relapse prevention is to them and identified triggers that they are on the path to relapse. Members also processed their feeling towards relapse and were able to relate to common experiences. Group also discussed coping skills that can be used for relapse prevention.     Therapeutic Modalities:   Cognitive Behavioral Therapy Solution-Focused Therapy Assertiveness Training Relapse Prevention Therapy    Lamar Sprinkles [phone] 05/05/2016 6:12 PM

## 2016-05-05 NOTE — Progress Notes (Signed)
Patient ID: Tara Brown, female   DOB: 1961-02-23, 54 y.o.   MRN: [id number] D: Patient states, "I need to be discharged today. I have to go take care of my cat."  Patient is worried about her cat since a neighbor was caring for it and let it go.  Patient states she needs to get home, "to take care of it and get it out of the heat."  She denies any thoughts of self harm.  She denies HI/AVH.  She continues to report depressive symptoms.  Patient is currently a placement possibility for assisted living since patient's ability to perform ADLs has decreased. A: Continue to monitor medication management and MD orders.  Safety checks completed every 15 minutes per protocol.  Offer support and encouragement as needed. R: Patient is receptive to staff; her behavior is appropriate.

## 2016-05-05 NOTE — Progress Notes (Signed)
Recreation Therapy Notes  Date: 05/05/16 Time: 0945 Location: 300 Hall Group Room  Group Topic: Stress Management  Goal Area(s) Addresses:  Patient will verbalize importance of using healthy stress management.  Patient will identify positive emotions associated with healthy stress management.   Intervention: Stress Management  Activity :  Progressive Muscle Relaxation.  LRT introduced the technique of progressive muscle relaxation to patients.  Patients were to follow along as LRT read script to engaged in the technique.  Education:  Stress Management, Discharge Planning.   Education Outcome: Acknowledges edcuation/In group clarification offered/Needs additional education  Clinical Observations/Feedback: Pt did not attend group.    Leeana Creer, LRT/CTRS   Yula Crotwell A 05/05/2016 12:52 PM 

## 2016-05-05 NOTE — Progress Notes (Signed)
Patient attended wrap-up group, but did not wish to share. 

## 2016-05-05 NOTE — Progress Notes (Signed)
Patient ID: Tara Brown, female   DOB: [birth year], 55 y.o.   MRN: [id number] [facility] MD Progress Note  05/05/2016 6:12 PM Tara Brown  MRN:  [id number]   Subjective: patient reports some reassurance in that a neighbor/friend will go to her house to let the cat back in and tend to it while she is gone . This is helping her feel less depressed . Denies medication side effects. Of note, takes Ultram for pain, had been taking on standing basis, denies side effects, states prefers standing rather than PRN dosing at this time . Denies having any side effects or problems with it . Objective: I have met with patient and have reviewed chart notes.  I have met patient along with CSW and we have focused on disposition planning - at this time [facility] has been sent and there is a ALF , which also allows pets ( this is very important to patient ) which may be a possibility early next week. Based on prior admissions after which patient returned home and was unable to maintain functional level , developed worsening psychiatric symptoms, it is felt by team that ongoing admission in order to work on disposition as above is warranted, and likely to decrease depression further and minimize risk of relapsing . CSW staff reports that it is much more difficult to process admission to an ALF from home than from hospital setting. Patient agrees with above . Visible on unit, no disruptive or agitated behaviors  Denies medication side effects    Principal Problem: Major depressive disorder, recurrent severe without psychotic features ([place]) Diagnosis:   Patient Active Problem List   Diagnosis Date Noted  . Back pain [M54.9] 04/25/2016  . Major depressive disorder, recurrent severe without psychotic features ([place]) [F33.2] 04/24/2016  . Left foot infection [L08.9] 12/06/2015  . Suicidal ideations [R45.851] 12/06/2015  . Rheumatoid arthritis ([place]) [M06.9] 12/06/2015  . Foot infection [L08.9] 12/05/2015  . Cellulitis  of toe, left [L03.032]   . Chronic osteomyelitis ([place]) [M86.60] 09/07/2015  . Anemia, iron deficiency [D50.9]   . Essential hypertension [I10]   . Anemia of chronic disease [D63.8] 03/08/2015   Total Time spent with patient: 20 minutes   Past Psychiatric History: Please see H&P.   Past Medical History:  Past Medical History:  Diagnosis Date  . Arthritis   . Major depressive disorder, recurrent, severe without psychotic features ([place]) 03/11/2015    Past Surgical History:  Procedure Laterality Date  . AMPUTATION Left 12/07/2015   Procedure: AMPUTATION LEFT GREAT TOE;  Surgeon: Newt Minion, MD;  Location: [facility];  Service: Orthopedics;  Laterality: Left;  . FOOT SURGERY    . HAND SURGERY    . HIP FRACTURE SURGERY    . KNEE SURGERY    . NECK SURGERY     Family History:  Family History  Problem Relation Age of Onset  . Bipolar disorder Sister   . Suicidality Other   . Schizophrenia Other   . Stroke Neg Hx    Family Psychiatric  History:Please see H&P.  Social History:  History  Alcohol Use  . Yes    Comment: Rarely     History  Drug Use No    Social History   Social History  . Marital status: Divorced    Spouse name: N/A  . Number of children: N/A  . Years of education: N/A   Social History Main Topics  . Smoking status: Never Smoker  . Smokeless tobacco: Never Used  .  Alcohol use Yes     Comment: Rarely  . Drug use: No  . Sexual activity: No   Other Topics Concern  . None   Social History Narrative  . None   Additional Social History:   Sleep: improved   Appetite:  Improving, still fair   Current Medications: Current Facility-Administered Medications  Medication Dose Route Frequency Provider Last Rate Last Dose  . alum & mag hydroxide-simeth (MAALOX/MYLANTA) 200-200-20 MG/5ML suspension 30 mL  30 mL Oral Q4H PRN Patrecia Pour, NP      . calcium-vitamin D (OSCAL WITH D) 500-200 MG-UNIT per tablet 1 tablet  1 tablet Oral BID Patrecia Pour, NP    1 tablet at 05/05/16 1708  . docusate sodium (COLACE) capsule 100 mg  100 mg Oral Daily Laverle Hobby, [name]   100 mg at 05/05/16 0756  . DULoxetine (CYMBALTA) DR capsule 60 mg  60 mg Oral Daily Sueanne Margarita, MD   60 mg at 05/05/16 0756  . feeding supplement (ENSURE ENLIVE) (ENSURE ENLIVE) liquid 237 mL  237 mL Oral BID BM Myer Peer Tharun Cappella, MD   237 mL at 05/05/16 0756  . ferrous sulfate tablet 325 mg  325 mg Oral BID WC Ursula Alert, MD   325 mg at 05/05/16 1708  . folic acid (FOLVITE) tablet 1 mg  1 mg Oral Daily Patrecia Pour, NP   1 mg at 05/05/16 0756  . hydrALAZINE (APRESOLINE) tablet 10 mg  10 mg Oral Q8H PRN Kerrie Buffalo, NP      . lidocaine (LIDODERM) 5 % 2 patch  2 patch Transdermal Q24H Ursula Alert, MD   Stopped at 05/05/16 1200  . magnesium hydroxide (MILK OF MAGNESIA) suspension 30 mL  30 mL Oral Daily PRN Patrecia Pour, NP      . naproxen (NAPROSYN) tablet 375 mg  375 mg Oral BID PRN Ursula Alert, MD   375 mg at 04/27/16 1546  . predniSONE (DELTASONE) tablet 10 mg  10 mg Oral Daily Patrecia Pour, NP   10 mg at 05/05/16 0756  . traMADol (ULTRAM) tablet 100 mg  100 mg Oral Q8H Jerimie Mancuso A Alveria Mcglaughlin, MD      . traZODone (DESYREL) tablet 100 mg  100 mg Oral QHS PRN Jenne Campus, MD   100 mg at 05/04/16 2237    Lab Results:  No results found for this or any previous visit (from the past 46 hour(s)).  Blood Alcohol level:  Lab Results  Component Value Date   ETH <5 04/23/2016   ETH <5 09/47/0962    Metabolic Disorder Labs: No results found for: HGBA1C, MPG No results found for: PROLACTIN No results found for: CHOL, TRIG, HDL, CHOLHDL, VLDL, LDLCALC  Physical Findings: AIMS: Facial and Oral Movements Muscles of Facial Expression: None, normal Lips and Perioral Area: None, normal Jaw: None, normal Tongue: None, normal,Extremity Movements Upper (arms, wrists, hands, fingers): None, normal Lower (legs, knees, ankles, toes): None, normal, Trunk Movements Neck,  shoulders, hips: None, normal, Overall Severity Severity of abnormal movements (highest score from questions above): None, normal Incapacitation due to abnormal movements: None, normal Patient's awareness of abnormal movements (rate only patient's report): No Awareness, Dental Status Current problems with teeth and/or dentures?: No Does patient usually wear dentures?: No  CIWA:    COWS:     Musculoskeletal: Strength & Muscle Tone: normal  Gait & Station: walks slowly due to pain and history of toe amputation  Patient leans: N/A  Psychiatric Specialty Exam: Physical Exam  Nursing note and vitals reviewed.   Review of Systems  Musculoskeletal: Positive for back pain, joint pain and neck pain.  Psychiatric/Behavioral: Positive for depression. The patient is nervous/anxious.   All other systems reviewed and are negative. chronic pain and discomfort related to RA. Denies vomiting, no fever, no chills, no rash   Blood pressure 128/85, pulse 77, temperature 97.8 F (36.6 C), temperature source Oral, resp. rate 18, height 5' 4"  (1.626 m), weight 102 lb (46.3 kg), SpO2 100 %.Body mass index is 17.51 kg/m.  General Appearance: improved grooming   Eye Contact:  Improved   Speech:  Normal Rate  Volume:  Normal  Mood:  Depressed, some improvement compared to admission   Affect:  Constricted, but smiles occasionally during session  Thought Process:  Linear   Orientation:  Full (Time, Place, and Person)  Thought Content:  Linear   Suicidal Thoughts:  No at this time denies suicidal ideations, denies any self injurious ideations , contracts for safety on the unit  Homicidal Thoughts:  No denies homicidal or violent  ideations   Memory:  Recent and remote grossly intact    Judgement:  Improving   Insight:   Improving   Psychomotor Activity:  Normal  Concentration:  Concentration: Good and Attention Span: Good  Recall:  Good  Fund of Knowledge:  Good  Language:  Good  Akathisia:  No   Handed:  Right  AIMS (if indicated):     Assets:  Desire for Improvement  ADL's:  Needs help  Cognition:  WNL  Sleep:  Number of Hours: 6.15   Assessment - partial improvement in mood and affect, although depression is chronic and related to her medical illness . Today expresses relief that a neighbor will take care of her cat . Agreeing to ALF placement, and paperwork, application in process . As per team, it makes sense to continue this application from inpatient unit at this time in order to improve possibility she will be able to transition to an ALF soon . Denies medication side effects.  Treatment Plan Summary: Encourage ongoing group and milieu participation to work on coping skills and symptom reduction   Continue Cymbalta 60 mgrs QDAY for depression , anxiety  Continue  Trazodone 100 mg  QHS   PRN for sleep as needed   Continue Tramadol for pain management, as needed ( decreased to Q 8 hours )  Continue Lidoderm patch for pain management  Continue Prednisone 10 mgrs QDAY for management of Autoimmune Disorder  Treatment team working on disposition planning- see above  Neita Garnet, MD 05/05/2016, 6:12 PM

## 2016-05-05 NOTE — Progress Notes (Signed)
D    Pt expresses irritation because the doctor changed her ultram order again   She said she told the doctor she just wants to take it like her rheumatalogist wants her to take it   So she just said she would spend the weekend in bed and refused the ultram dose at 2200  A    Verbal support given   Medications aqdministered and effectiveness monitored   Q 15 min checks R    Pt is safe

## 2016-05-06 MED ORDER — TRAMADOL HCL 50 MG PO TABS
100.0000 mg | ORAL_TABLET | Freq: Once | ORAL | Status: AC
  Administered 2016-05-06: 100 mg via ORAL

## 2016-05-06 MED ORDER — TRAMADOL HCL 50 MG PO TABS
100.0000 mg | ORAL_TABLET | Freq: Four times a day (QID) | ORAL | Status: DC
  Administered 2016-05-06 – 2016-05-15 (×35): 100 mg via ORAL
  Filled 2016-05-06 (×36): qty 2

## 2016-05-06 NOTE — Plan of Care (Signed)
Problem: Activity: Goal: Interest or engagement in activities will improve Outcome: Progressing Pt participates in unit activities Goal: Sleeping patterns will improve Outcome: Progressing Pt sleeps 6 to 7 hours for the past two nights  Problem: Coping: Goal: Ability to verbalize frustrations and anger appropriately will improve Outcome: Progressing Pt is able to verbalize her frustration over medication changes she does not understand and while it makes her somewhat angry she remains in control  Goal: Ability to demonstrate self-control will improve Outcome: Progressing Pt is able to demonstrate self control

## 2016-05-06 NOTE — Progress Notes (Signed)
Adult Psychoeducational Group Note  Date:  05/06/2016 Time:  9:09 PM  Group Topic/Focus:  Wrap-Up Group:   The focus of this group is to help patients review their daily goal of treatment and discuss progress on daily workbooks.   Participation Level:  Active  Participation Quality:  Appropriate  Affect:  Appropriate  Cognitive:  Appropriate  Insight: Appropriate  Engagement in Group:  Engaged and Supportive  Modes of Intervention:  Discussion  Additional Comments:  Patient reported having a "bad previous 24-hours."  Patient reported wanting to work on discharge and idenitfied a positive coping skill of obtaining an adequate amount of sleep.      Elmore Guise N 05/06/2016, 9:09 PM

## 2016-05-06 NOTE — [facility] Group Notes (Signed)
[facility] LCSW Group Therapy  05/06/2016 10:00 until 11:00 AM  Type of Therapy:  Group Therapy  Participation Level:  Did Not Attend; despite overhead announcement and face to face encouragement from LCSW.    Carney Bern, LCSW

## 2016-05-06 NOTE — Progress Notes (Signed)
D    Pt is calm and cooperative   She apologized for her behaviors regarding refusing her medications the night before    She took her medication without difficulty    Her lidocaine patch was removed at 2200 instead of 3 am and she received her ultram at 2200 nstead of midnight to allow her to sleep through the night   Pt was agreeable with same A   Verbal support given   Medications administered and effectiveness monitored    Q 15 min checks  R   Pt is safe at present

## 2016-05-06 NOTE — [facility] Group Notes (Signed)
[facility] Group Notes:  (Nursing/MHT/Case Management/Adjunct)  Date:  05/06/2016  Time:  0930  Type of Therapy:  Nurse Education - Coping Skills and Strategies  Participation Level:  Active  Participation Quality:  Attentive  Affect:  Blunted  Cognitive:  Alert  Insight:  Improving  Engagement in Group:  Engaged  Modes of Intervention:  Education, Support  Summary of Progress/Problems: Patient shared she is more optimistic about the future. States going to the pool to float as well as reading are her coping skills. She also states spending time with her nephews is relaxing.   Tara Brown [facility] 05/06/2016, 1000

## 2016-05-06 NOTE — Progress Notes (Signed)
Patient ID: Tara Brown, female   DOB: 14-Jul-1961, 55 y.o.   MRN: [id number] [facility] MD Progress Note  05/06/2016 12:42 PM Gisella Alwine  MRN:  [id number]   Subjective: reports ongoing sense of depression, but states she feels she is improving gradually, and is focused on discharge soon. She ruminates about her pet cat , currently being fed and monitored by a neighbor .  She denies medication side effects. Efforts to taper Ultram dose down have not been successful- patient states she notices increased pain and decreased mobility due to this when dose was decreased from QID to TID. Of note, states she has been taking this medication/dose for " a long time" preceding this admission, without history of side effects, no seizures or serotonin syndrome, no drug drug interaction issues in the past . Objective: I have met with patient and have reviewed chart notes.  Remains vaguely depressed, constricted, but expresses some improvement , and denies suicidal ideations. Going to some groups, at times tends to isolate in room. Denies medication side effects. As mentioned , currently working on disposition planning, specifically in trying to move forward application process to enter an ALF soon after discharge. Patient's major barrier to going to an ALF, which she states she feels she should do, is concern about her pet cat. She was excited to find out from [place] that some facilities may allow pets, but states " I am not keeping my hopes up ".  No  disruptive or agitated behavior on unit Denies medication side effects    Principal Problem: Major depressive disorder, recurrent severe without psychotic features ([place]) Diagnosis:   Patient Active Problem List   Diagnosis Date Noted  . Back pain [M54.9] 04/25/2016  . Major depressive disorder, recurrent severe without psychotic features ([place]) [F33.2] 04/24/2016  . Left foot infection [L08.9] 12/06/2015  . Suicidal ideations [R45.851] 12/06/2015  . Rheumatoid  arthritis ([place]) [M06.9] 12/06/2015  . Foot infection [L08.9] 12/05/2015  . Cellulitis of toe, left [L03.032]   . Chronic osteomyelitis ([place]) [M86.60] 09/07/2015  . Anemia, iron deficiency [D50.9]   . Essential hypertension [I10]   . Anemia of chronic disease [D63.8] 03/08/2015   Total Time spent with patient: 20 minutes   Past Psychiatric History: Please see H&P.   Past Medical History:  Past Medical History:  Diagnosis Date  . Arthritis   . Major depressive disorder, recurrent, severe without psychotic features ([place]) 03/11/2015    Past Surgical History:  Procedure Laterality Date  . AMPUTATION Left 12/07/2015   Procedure: AMPUTATION LEFT GREAT TOE;  Surgeon: Newt Minion, MD;  Location: [facility];  Service: Orthopedics;  Laterality: Left;  . FOOT SURGERY    . HAND SURGERY    . HIP FRACTURE SURGERY    . KNEE SURGERY    . NECK SURGERY     Family History:  Family History  Problem Relation Age of Onset  . Bipolar disorder Sister   . Suicidality Other   . Schizophrenia Other   . Stroke Neg Hx    Family Psychiatric  History:Please see H&P.  Social History:  History  Alcohol Use  . Yes    Comment: Rarely     History  Drug Use No    Social History   Social History  . Marital status: Divorced    Spouse name: N/A  . Number of children: N/A  . Years of education: N/A   Social History Main Topics  . Smoking status: Never Smoker  . Smokeless tobacco:  Never Used  . Alcohol use Yes     Comment: Rarely  . Drug use: No  . Sexual activity: No   Other Topics Concern  . None   Social History Narrative  . None   Additional Social History:   Sleep: improved   Appetite: fair   Current Medications: Current Facility-Administered Medications  Medication Dose Route Frequency Provider Last Rate Last Dose  . alum & mag hydroxide-simeth (MAALOX/MYLANTA) 200-200-20 MG/5ML suspension 30 mL  30 mL Oral Q4H PRN Patrecia Pour, NP      . calcium-vitamin D (OSCAL WITH D)  500-200 MG-UNIT per tablet 1 tablet  1 tablet Oral BID Patrecia Pour, NP   1 tablet at 05/06/16 0919  . docusate sodium (COLACE) capsule 100 mg  100 mg Oral Daily Laverle Hobby, [name]   100 mg at 05/06/16 0919  . DULoxetine (CYMBALTA) DR capsule 60 mg  60 mg Oral Daily Sueanne Margarita, MD   60 mg at 05/06/16 0918  . feeding supplement (ENSURE ENLIVE) (ENSURE ENLIVE) liquid 237 mL  237 mL Oral BID BM Myer Peer Cobos, MD   237 mL at 05/05/16 0756  . ferrous sulfate tablet 325 mg  325 mg Oral BID WC Ursula Alert, MD   325 mg at 05/06/16 0919  . folic acid (FOLVITE) tablet 1 mg  1 mg Oral Daily Patrecia Pour, NP   1 mg at 05/06/16 0919  . hydrALAZINE (APRESOLINE) tablet 10 mg  10 mg Oral Q8H PRN Kerrie Buffalo, NP      . lidocaine (LIDODERM) 5 % 2 patch  2 patch Transdermal Q24H Ursula Alert, MD   2 patch at 05/05/16 1821  . magnesium hydroxide (MILK OF MAGNESIA) suspension 30 mL  30 mL Oral Daily PRN Patrecia Pour, NP      . naproxen (NAPROSYN) tablet 375 mg  375 mg Oral BID PRN Ursula Alert, MD   375 mg at 04/27/16 1546  . predniSONE (DELTASONE) tablet 10 mg  10 mg Oral Daily Patrecia Pour, NP   10 mg at 05/06/16 0919  . traMADol (ULTRAM) tablet 100 mg  100 mg Oral Q8H Fernando A Cobos, MD      . traZODone (DESYREL) tablet 100 mg  100 mg Oral QHS PRN Jenne Campus, MD   100 mg at 05/05/16 2228    Lab Results:  No results found for this or any previous visit (from the past 32 hour(s)).  Blood Alcohol level:  Lab Results  Component Value Date   ETH <5 04/23/2016   ETH <5 28/31/5176    Metabolic Disorder Labs: No results found for: HGBA1C, MPG No results found for: PROLACTIN No results found for: CHOL, TRIG, HDL, CHOLHDL, VLDL, LDLCALC  Physical Findings: AIMS: Facial and Oral Movements Muscles of Facial Expression: None, normal Lips and Perioral Area: None, normal Jaw: None, normal Tongue: None, normal,Extremity Movements Upper (arms, wrists, hands, fingers): None,  normal Lower (legs, knees, ankles, toes): None, normal, Trunk Movements Neck, shoulders, hips: None, normal, Overall Severity Severity of abnormal movements (highest score from questions above): None, normal Incapacitation due to abnormal movements: None, normal Patient's awareness of abnormal movements (rate only patient's report): No Awareness, Dental Status Current problems with teeth and/or dentures?: No Does patient usually wear dentures?: No  CIWA:    COWS:     Musculoskeletal: Strength & Muscle Tone: normal  Gait & Station: walks slowly due to pain and history of toe amputation  Patient  leans: N/A  Psychiatric Specialty Exam: Physical Exam  Nursing note and vitals reviewed.   Review of Systems  Musculoskeletal: Positive for back pain, joint pain and neck pain.  Psychiatric/Behavioral: Positive for depression. The patient is nervous/anxious.   All other systems reviewed and are negative. chronic pain and discomfort related to RA. Denies vomiting, no fever, no chills, no rash   Blood pressure 129/73, pulse 84, temperature 98.1 F (36.7 C), temperature source Oral, resp. rate 18, height 5' 4"  (1.626 m), weight 102 lb (46.3 kg), SpO2 100 %.Body mass index is 17.51 kg/m.  General Appearance: improved grooming   Eye Contact:  Improved   Speech:  Normal Rate  Volume:  Normal  Mood:  Remains depressed, partial improvement   Affect:  Constricted, but smiles occasionally during session  Thought Process:  Linear   Orientation:  Full (Time, Place, and Person)  Thought Content:  Linear   Suicidal Thoughts:  No at this time denies suicidal ideations, denies any self injurious ideations , contracts for safety on the unit  Homicidal Thoughts:  No denies homicidal or violent  ideations   Memory:  Recent and remote grossly intact    Judgement:  Improving   Insight:   Improving   Psychomotor Activity:  Normal  Concentration:  Concentration: Good and Attention Span: Good  Recall:   Good  Fund of Knowledge:  Good  Language:  Good  Akathisia:  No  Handed:  Right  AIMS (if indicated):     Assets:  Desire for Improvement  ADL's:  Needs help  Cognition:  WNL  Sleep:  Number of Hours: 5.5   Assessment - remains depressed, sad, but has improved partially since admission - no suicidal ideations . Isolative at times. Thus far tolerating medications well ,no medication side effects. Requests for Ultram dose to be increased to Q 6 hours, as she was taking at home, because states that lower doses associated with  More pain and functional difficulties. Denies medication side effects, no history of serotonin syndrome or seizures . At this time future oriented, focusing on discharge issues, ruminative about her pet .   Treatment Plan Summary: Encourage ongoing group and milieu participation to work on coping skills and symptom reduction   Continue Cymbalta 60 mgrs QDAY for depression , anxiety  Continue  Trazodone 100 mg  QHS   PRN for sleep as needed   Increase Tramadol to Q 6 hours for pain management  Continue Lidoderm patch for pain management  Continue Prednisone 10 mgrs QDAY for management of Autoimmune Disorder  Treatment team working on disposition planning- see above  Neita Garnet, MD 05/06/2016, 12:42 PM

## 2016-05-06 NOTE — Progress Notes (Signed)
Data. Patient denies SI/HI/AVH.   Patient began the day by telling the nurse, "I'm not taking your medications. I'm not eatting and I'm not going to get up. I am going to lie here in this bed until I am discharged on Monday". She states that she is angry with the doctor, "So I'm not going to do anything else, in here." She came down to the nurses station a short time later asking for her medicaton and stating, "I shouldn't take my anger out on you. I am not going to punish myself for my anger towards someone else." Patient continues to be fearful of her cat being outside while she is her, but does say that neighbors have seen it and are currently feeding it. Patient interacting well with staff and other patients. On her self assessment she reports, 5/10 for anxiety, depression and hopelessness. Her goal today is: "D'C". Action. Emotional support and encouragement offered. Education provided on medication, indications and side effect. Q 15 minute checks done for safety. Response. Safety on the unit maintained through 15 minute checks.  Medications taken as prescribed. Attended groups. Remained calm and appropriate through out shift.

## 2016-05-07 ENCOUNTER — Inpatient Hospital Stay (HOSPITAL_COMMUNITY)
Admission: AD | Admit: 2016-05-07 | Discharge: 2016-05-07 | Disposition: A | Discharge status: Home / Self Care | Payer: Medicare Other | Source: Intra-hospital | Attending: Psychiatry | Admitting: Psychiatry

## 2016-05-07 NOTE — Progress Notes (Signed)
Patient attended wrap-up group, but not participate.

## 2016-05-07 NOTE — Plan of Care (Signed)
Problem: Education: Goal: Utilization of techniques to improve thought processes will improve Outcome: Progressing Patient's affect and her verbalizations are more positive and forward thinking today. She expressed delight that her cat was back in the apartment, and has been seen smiling with interaction throughout the day.

## 2016-05-07 NOTE — [facility] Group Notes (Signed)
[facility] Group Notes:  (Nursing/MHT/Case Management/Adjunct)  Date:  05/07/2016  Time:  10:37 AM  Type of Therapy:  Nurse Education  Participation Level:  Active  Participation Quality:  Appropriate  Affect:  Appropriate  Cognitive:  Appropriate  Insight:  Appropriate  Engagement in Group:  Engaged  Modes of Intervention:  Discussion, Education and Support  Summary of Progress/Problems: Her goal was to start accept that she's going to an ALF after discharge.   Maurine Simmering 05/07/2016, 10:37 AM

## 2016-05-07 NOTE — Progress Notes (Addendum)
Patient ID: Tara Brown, female   DOB: August 24, 1961, 55 y.o.   MRN: [id number] [facility] MD Progress Note  05/07/2016 5:57 PM Tara Brown  MRN:  [id number]   Subjective: today feeling some depression related to increased pain, particularly in upper back and right shoulder- states she has had spontaneous fractures of her scapula in the past . States, however, that she feels better because she has been told her pet cat is now inside the house again- she had been quite anxious that her cat had been outside for a few days, and worried it would get lost or hurt, states that now that she knows it is safe she feels much better  Objective: I have met with patient and have reviewed chart notes.  Remains depressed, but affect is reactive, fuller in range, smiles at times appropriately, grooming is improved today  Ruminates about her sisters not visiting her in hospital and about limited family support Denies suicidal ideations at this time As noted, reports some increased R shoulder pain, discomfort. Does not appear to be in any acute distress  Reassured by pet cat being safe as above  Motivated in continuing process to go to an ALF  No  disruptive or agitated behavior on unit Denies medication side effects    Principal Problem: Major depressive disorder, recurrent severe without psychotic features ([place]) Diagnosis:   Patient Active Problem List   Diagnosis Date Noted  . Back pain [M54.9] 04/25/2016  . Major depressive disorder, recurrent severe without psychotic features ([place]) [F33.2] 04/24/2016  . Left foot infection [L08.9] 12/06/2015  . Suicidal ideations [R45.851] 12/06/2015  . Rheumatoid arthritis ([place]) [M06.9] 12/06/2015  . Foot infection [L08.9] 12/05/2015  . Cellulitis of toe, left [L03.032]   . Chronic osteomyelitis ([place]) [M86.60] 09/07/2015  . Anemia, iron deficiency [D50.9]   . Essential hypertension [I10]   . Anemia of chronic disease [D63.8] 03/08/2015   Total Time spent with  patient: 20 minutes   Past Psychiatric History: Please see H&P.   Past Medical History:  Past Medical History:  Diagnosis Date  . Arthritis   . Major depressive disorder, recurrent, severe without psychotic features ([place]) 03/11/2015    Past Surgical History:  Procedure Laterality Date  . AMPUTATION Left 12/07/2015   Procedure: AMPUTATION LEFT GREAT TOE;  Surgeon: Newt Minion, MD;  Location: [facility];  Service: Orthopedics;  Laterality: Left;  . FOOT SURGERY    . HAND SURGERY    . HIP FRACTURE SURGERY    . KNEE SURGERY    . NECK SURGERY     Family History:  Family History  Problem Relation Age of Onset  . Bipolar disorder Sister   . Suicidality Other   . Schizophrenia Other   . Stroke Neg Hx    Family Psychiatric  History:Please see H&P.  Social History:  History  Alcohol Use  . Yes    Comment: Rarely     History  Drug Use No    Social History   Social History  . Marital status: Divorced    Spouse name: N/A  . Number of children: N/A  . Years of education: N/A   Social History Main Topics  . Smoking status: Never Smoker  . Smokeless tobacco: Never Used  . Alcohol use Yes     Comment: Rarely  . Drug use: No  . Sexual activity: No   Other Topics Concern  . None   Social History Narrative  . None   Additional Social History:  Sleep: improved   Appetite: fair   Current Medications: Current Facility-Administered Medications  Medication Dose Route Frequency Provider Last Rate Last Dose  . alum & mag hydroxide-simeth (MAALOX/MYLANTA) 200-200-20 MG/5ML suspension 30 mL  30 mL Oral Q4H PRN Patrecia Pour, NP      . calcium-vitamin D (OSCAL WITH D) 500-200 MG-UNIT per tablet 1 tablet  1 tablet Oral BID Patrecia Pour, NP   1 tablet at 05/07/16 1722  . docusate sodium (COLACE) capsule 100 mg  100 mg Oral Daily Laverle Hobby, [name]   100 mg at 05/07/16 0816  . DULoxetine (CYMBALTA) DR capsule 60 mg  60 mg Oral Daily Sueanne Margarita, MD   60 mg at 05/07/16  0816  . feeding supplement (ENSURE ENLIVE) (ENSURE ENLIVE) liquid 237 mL  237 mL Oral BID BM Myer Peer Kenlee Vogt, MD   237 mL at 05/07/16 1548  . ferrous sulfate tablet 325 mg  325 mg Oral BID WC Ursula Alert, MD   325 mg at 05/07/16 1722  . folic acid (FOLVITE) tablet 1 mg  1 mg Oral Daily Patrecia Pour, NP   1 mg at 05/07/16 0817  . hydrALAZINE (APRESOLINE) tablet 10 mg  10 mg Oral Q8H PRN Kerrie Buffalo, NP      . lidocaine (LIDODERM) 5 % 2 patch  2 patch Transdermal Q24H Ursula Alert, MD   2 patch at 05/07/16 1202  . magnesium hydroxide (MILK OF MAGNESIA) suspension 30 mL  30 mL Oral Daily PRN Patrecia Pour, NP      . naproxen (NAPROSYN) tablet 375 mg  375 mg Oral BID PRN Ursula Alert, MD   375 mg at 04/27/16 1546  . predniSONE (DELTASONE) tablet 10 mg  10 mg Oral Daily Patrecia Pour, NP   10 mg at 05/07/16 0817  . traMADol (ULTRAM) tablet 100 mg  100 mg Oral Q6H Myer Peer Kamryn Gauthier, MD   100 mg at 05/07/16 1722  . traZODone (DESYREL) tablet 100 mg  100 mg Oral QHS PRN Jenne Campus, MD   100 mg at 05/06/16 2146    Lab Results:  No results found for this or any previous visit (from the past 23 hour(s)).  Blood Alcohol level:  Lab Results  Component Value Date   ETH <5 04/23/2016   ETH <5 87/86/7672    Metabolic Disorder Labs: No results found for: HGBA1C, MPG No results found for: PROLACTIN No results found for: CHOL, TRIG, HDL, CHOLHDL, VLDL, LDLCALC  Physical Findings: AIMS: Facial and Oral Movements Muscles of Facial Expression: None, normal Lips and Perioral Area: None, normal Jaw: None, normal Tongue: None, normal,Extremity Movements Upper (arms, wrists, hands, fingers): None, normal Lower (legs, knees, ankles, toes): None, normal, Trunk Movements Neck, shoulders, hips: None, normal, Overall Severity Severity of abnormal movements (highest score from questions above): None, normal Incapacitation due to abnormal movements: None, normal Patient's awareness of  abnormal movements (rate only patient's report): No Awareness, Dental Status Current problems with teeth and/or dentures?: No Does patient usually wear dentures?: No  CIWA:    COWS:     Musculoskeletal: Strength & Muscle Tone: normal  Gait & Station: walks slowly due to pain and history of toe amputation  Patient leans: N/A  Psychiatric Specialty Exam: Physical Exam  Nursing note and vitals reviewed.   Review of Systems  Musculoskeletal: Positive for back pain, joint pain and neck pain.  Psychiatric/Behavioral: Positive for depression. The patient is nervous/anxious.   All other  systems reviewed and are negative. chronic pain and discomfort related to RA. Denies vomiting, no fever, no chills, no rash   Blood pressure 135/76, pulse 87, temperature 98.6 F (37 C), temperature source Oral, resp. rate 16, height 5' 4"  (1.626 m), weight 102 lb (46.3 kg), SpO2 100 %.Body mass index is 17.51 kg/m.  General Appearance: improved grooming   Eye Contact:  Improved   Speech:  Normal Rate  Volume:  Normal  Mood:  Mood partially improved compared to admission   Affect:  Constricted, but more reactive, also less anxious   Thought Process:  Linear   Orientation:  Full (Time, Place, and Person)  Thought Content:  Linear   Suicidal Thoughts:  No at this time denies suicidal ideations, denies any self injurious ideations , contracts for safety on the unit  Homicidal Thoughts:  No denies homicidal or violent  ideations   Memory:  Recent and remote grossly intact    Judgement:  Improving   Insight:   Improving   Psychomotor Activity:  Normal  Concentration:  Concentration: Good and Attention Span: Good  Recall:  Good  Fund of Knowledge:  Good  Language:  Good  Akathisia:  No  Handed:  Right  AIMS (if indicated):     Assets:  Desire for Improvement  ADL's:  Needs help  Cognition:  WNL  Sleep:  Number of Hours: 5.25   Assessment - still depressed, constricted in affect, but has improved  partially compared to admission and is more future oriented, denies any suicidal ideations, tolerating medications well at this time, reassured because found out her pet cat is back home, safe . Reports some increased R shoulder pain today .   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 Neita Garnet, MD 05/07/2016, 5:57 PM

## 2016-05-07 NOTE — [facility] Group Notes (Signed)
    [facility] LCSW Group Therapy  05/07/2016 10:00 until 10:55 AM  Type of Therapy:  Group Therapy  Participation Level:  Minimal  Participation Quality:  Attentive  Affect:  Flat  Cognitive:  Alert and Oriented  Insight:  Developing/Improving  Engagement in Therapy:  Developing/Improving  Modes of Intervention:  Clarification, Discussion, Rapport Building, Socialization and Support  Summary of Progress/Problems: Topic for today was thoughts and feelings regarding discharge. We discussed fears of upcoming changes including judgements, expectations and stigma of mental health issues. We then discussed supports: what constitutes a supportive framework, identification of supports and what to do when others are not supportive.  Patients then processed the concept of "It's only a thought, and a thought can be changed." Patient shared little when asked direct questions yet did appear attentive as evidenced by eye contact and body language. Pt expressed willingness to try the "HALT" exercise for the rest of the day.   Carney Bern, LCSW

## 2016-05-07 NOTE — Progress Notes (Addendum)
Data. Patient denies SI/HI/AVH. Her affect is brighter today and she reports that her neighbor found her cat and put it back in her apartment and is feeding it regularly., "So if I need to stay longer I can do that. The social worker is looking at an assisted living place for me and that may take longer if I leave." Patient interacting well with staff and other patients and ha spent much of the shift in the day room interacting and laughing with peers. On her self assessment she reports 5/10 for anxiety, depression and hopelessness. Her goal for today is: "D/C". Order received for an X-ray of patient's right shoulder. X-ray and oncoming charge nurse notified. Action. Emotional support and encouragement offered. Education provided on medication, indications and side effect. Q 15 minute checks done for safety. Response. Safety on the unit maintained through 15 minute checks.  Medications taken as prescribed. Attended groups. Remained calm and appropriate through out shift.

## 2016-05-08 NOTE — [facility] Group Notes (Signed)
[facility] LCSW Group Therapy  05/08/2016 1:15pm  Type of Therapy:  Group Therapy vercoming Obstacles  Participation Level:  Active  Participation Quality:  Appropriate   Affect:  Appropriate  Cognitive:  Appropriate and Oriented  Insight:  Developing/Improving and Improving  Engagement in Therapy:  Improving  Modes of Intervention:  Discussion, Exploration, Problem-solving and Support  Description of Group:   In this group patients will be encouraged to explore what they see as obstacles to their own wellness and recovery. They will be guided to discuss their thoughts, feelings, and behaviors related to these obstacles. The group will process together ways to cope with barriers, with attention given to specific choices patients can make. Each patient will be challenged to identify changes they are motivated to make in order to overcome their obstacles. This group will be process-oriented, with patients participating in exploration of their own experiences as well as giving and receiving support and challenge from other group members.  Summary of Patient Progress: Pt was able to identify tactics to create resiliency in her own life and was able to offer suggestions to others in the room.    Therapeutic Modalities:   Cognitive Behavioral Therapy Solution Focused Therapy Motivational Interviewing Relapse Prevention Therapy   Chad Cordial, LCSWA 05/08/2016 3:41 PM

## 2016-05-08 NOTE — Plan of Care (Signed)
Problem: Medication: Goal: Compliance with prescribed medication regimen will improve Outcome: Progressing Pt. is taking meds as ordered.    

## 2016-05-08 NOTE — Progress Notes (Signed)
D: Pt. goal for today is "trying to accept what is going to happen even though I don't want too" (assisted living arrangement). Pt. states that assisted living facilities are meant for old people in "their 90s". Pt. is educated on the benefits of ASL in helping her with ADLs and meeting new people. Pt. concern is that she doesn't know anyone near the ASL in [facility] that is arrange for her. Pt. is up and visible in the room. Denies having any SI/HI and AVH at this time. Rates pain as 6/10, a manageable pain level is 3/10.   A: Encouragement and support given. Mediations ordered and given. PRN Trazodone requested and given and re-eval as necessary.   R: Safety maintained with Q 15 checks. Continues to follow treatment plan and will monitor closely.    Signed by: Tyrone Apple, RN

## 2016-05-08 NOTE — Tx Team (Signed)
Interdisciplinary Treatment Plan Update (Adult) Date: 05/08/2016   Date: 05/08/2016 3:32 PM  Progress in Treatment:  Attending groups: Yes Participating in groups: Minimally Taking medication as prescribed: Yes  Tolerating medication: Yes  Family/Significant othe contact made: Yes with sister and father Patient understands diagnosis: Yes AEB seeking help with depression Discussing patient identified problems/goals with staff: Yes  Medical problems stabilized or resolved: Yes  Denies suicidal/homicidal ideation: Yes Patient has not harmed self or Others: Yes   New problem(s) identified: None identified at this time.   Discharge Plan or Barriers: CSW will assess for appropriate discharge plan and relevant barriers.   04/28/16: Pt will return home and follow-up with outpatient services.   05/03/16: ALF placement started; PASRR pending; FL2 created and CSW will begin seeking placement in [facility] ALF.  05/08/16: CSW continues to seek placement in ALF facility; PASRR received and FL2 sent to various facilities.   Additional comments:  Patient and CSW reviewed pt's identified goals and treatment plan. Patient verbalized understanding and agreed to treatment plan.   Reason for Continuation of Hospitalization:  Depression Medication stabilization Suicidal ideation  Estimated length of stay: 2-3 days  Review of initial/current patient goals per problem list:   1.  Goal(s): Patient will participate in aftercare plan  Met:  Yes  Target date: 3-5 days from date of admission   As evidenced by: Patient will participate within aftercare plan AEB aftercare provider and housing plan at discharge being identified.  04/25/16: CSW to work with Pt to assess for appropriate discharge plan and faciliate appointments and referrals as needed prior to d/c. 04/28/16: Pt will return home and follow-up with outpatient services.  05/03/16: ALF placement started; PASRR pending; FL2 created and CSW will  begin seeking placement in [facility] ALF. 05/08/16: CSW continues to seek placement in ALF facility; PASRR received and FL2 sent to various facilities.   2.  Goal (s): Patient will exhibit decreased depressive symptoms and suicidal ideations.  Met:  Progressing   Target date: 3-5 days from date of admission   As evidenced by: Patient will utilize self rating of depression at 3 or below and demonstrate decreased signs of depression or be deemed stable for discharge by MD. 04/25/16: Pt was admitted with symptoms of depression, rating 10/10. Pt continues to present with flat affect and depressive symptoms.  Pt will demonstrate decreased symptoms of depression and rate depression at 3/10 or lower prior to discharge. 04/28/16: Pt rates depression at 5/10; denies SI 05/03/16: Pt rates depression at 5/10; denies SI 05/08/16: Pt rates depression at 4/10; denies SI  Attendees:  Patient:    Family:    Physician: Dr. Julieanne Manson, MD  05/08/2016 3:32 PM  Nursing: Sandre Kitty, RN; Eulogio Bear, RN 05/08/2016 3:32 PM  Clinical Social Worker Peri Maris, [place] 05/08/2016 3:32 PM  Other: Tilden Fossa, LCSWA 05/08/2016 3:32 PM  Clinical: Lars Pinks, RN Case manager  05/08/2016 3:32 PM  Other:  05/08/2016 3:32 PM  Other:     Peri Maris, [place] Work [phone]

## 2016-05-08 NOTE — Progress Notes (Signed)
Patient ID: Tara Brown, female   DOB: 1961-03-10, 55 y.o.   MRN: [id number] [facility] MD Progress Note  05/08/2016 2:37 PM Tara Brown  MRN:  [id number]   Subjective: today feeling some depression related to increased pain, particularly in upper back and right shoulder- states she has had spontaneous fractures of her scapula in the past . States, however, that she feels better because she has been told her pet cat is now inside the house again- she had been quite anxious that her cat had been outside for a few days, and worried it would get lost or hurt, states that now that she knows it is safe she feels much better  Objective: I and Education officer, museum  met with patient and have reviewed chart notes. Patient rescinds her 72 hour request for discharge. Social Worker is looking for placement for patient because of chronic medical condition that prevents patient from caring for self.Appointment to be made with PCP with regards to xray changes in her right shoulder from yesterday. Less pain in rt shoulder today. Less depressed, but affect is reactive, fuller in range, smiles at times appropriately, grooming is improved today  Ruminates about her sisters not visiting her in hospital and about limited family support Denies suicidal ideations at this time As noted, reports some increased R shoulder pain, discomfort. Does not appear to be in any acute distress  Reassured by pet cat being safe as above  Motivated in continuing process to go to an ALF  No  disruptive or agitated behavior on unit Denies medication side effects    Principal Problem: Major depressive disorder, recurrent severe without psychotic features ([place]) Diagnosis:   Patient Active Problem List   Diagnosis Date Noted  . Back pain [M54.9] 04/25/2016  . Major depressive disorder, recurrent severe without psychotic features ([place]) [F33.2] 04/24/2016  . Left foot infection [L08.9] 12/06/2015  . Suicidal ideations [R45.851] 12/06/2015  .  Rheumatoid arthritis ([place]) [M06.9] 12/06/2015  . Foot infection [L08.9] 12/05/2015  . Cellulitis of toe, left [L03.032]   . Chronic osteomyelitis ([place]) [M86.60] 09/07/2015  . Anemia, iron deficiency [D50.9]   . Essential hypertension [I10]   . Anemia of chronic disease [D63.8] 03/08/2015   Total Time spent with patient: 20 minutes   Past Psychiatric History: Please see H&P.   Past Medical History:  Past Medical History:  Diagnosis Date  . Arthritis   . Major depressive disorder, recurrent, severe without psychotic features ([place]) 03/11/2015    Past Surgical History:  Procedure Laterality Date  . AMPUTATION Left 12/07/2015   Procedure: AMPUTATION LEFT GREAT TOE;  Surgeon: Newt Minion, MD;  Location: [facility];  Service: Orthopedics;  Laterality: Left;  . FOOT SURGERY    . HAND SURGERY    . HIP FRACTURE SURGERY    . KNEE SURGERY    . NECK SURGERY     Family History:  Family History  Problem Relation Age of Onset  . Bipolar disorder Sister   . Suicidality Other   . Schizophrenia Other   . Stroke Neg Hx    Family Psychiatric  History:Please see H&P.  Social History:  History  Alcohol Use  . Yes    Comment: Rarely     History  Drug Use No    Social History   Social History  . Marital status: Divorced    Spouse name: N/A  . Number of children: N/A  . Years of education: N/A   Social History Main Topics  . Smoking  status: Never Smoker  . Smokeless tobacco: Never Used  . Alcohol use Yes     Comment: Rarely  . Drug use: No  . Sexual activity: No   Other Topics Concern  . None   Social History Narrative  . None   Additional Social History:   Sleep: improved   Appetite: fair   Current Medications: Current Facility-Administered Medications  Medication Dose Route Frequency Provider Last Rate Last Dose  . alum & mag hydroxide-simeth (MAALOX/MYLANTA) 200-200-20 MG/5ML suspension 30 mL  30 mL Oral Q4H PRN Patrecia Pour, NP      . calcium-vitamin D (OSCAL  WITH D) 500-200 MG-UNIT per tablet 1 tablet  1 tablet Oral BID Patrecia Pour, NP   1 tablet at 05/08/16 0749  . docusate sodium (COLACE) capsule 100 mg  100 mg Oral Daily Laverle Hobby, [name]   100 mg at 05/08/16 0749  . DULoxetine (CYMBALTA) DR capsule 60 mg  60 mg Oral Daily Sueanne Margarita, MD   60 mg at 05/08/16 0749  . feeding supplement (ENSURE ENLIVE) (ENSURE ENLIVE) liquid 237 mL  237 mL Oral BID BM Myer Peer Cobos, MD   237 mL at 05/07/16 1548  . ferrous sulfate tablet 325 mg  325 mg Oral BID WC Ursula Alert, MD   325 mg at 05/08/16 0750  . folic acid (FOLVITE) tablet 1 mg  1 mg Oral Daily Patrecia Pour, NP   1 mg at 05/08/16 0749  . hydrALAZINE (APRESOLINE) tablet 10 mg  10 mg Oral Q8H PRN Kerrie Buffalo, NP      . lidocaine (LIDODERM) 5 % 2 patch  2 patch Transdermal Q24H Ursula Alert, MD   2 patch at 05/08/16 1211  . magnesium hydroxide (MILK OF MAGNESIA) suspension 30 mL  30 mL Oral Daily PRN Patrecia Pour, NP      . naproxen (NAPROSYN) tablet 375 mg  375 mg Oral BID PRN Ursula Alert, MD   375 mg at 04/27/16 1546  . predniSONE (DELTASONE) tablet 10 mg  10 mg Oral Daily Patrecia Pour, NP   10 mg at 05/08/16 0749  . traMADol (ULTRAM) tablet 100 mg  100 mg Oral Q6H Jenne Campus, MD   100 mg at 05/08/16 1211  . traZODone (DESYREL) tablet 100 mg  100 mg Oral QHS PRN Jenne Campus, MD   100 mg at 05/07/16 2143    Lab Results:  No results found for this or any previous visit (from the past 25 hour(s)).  Blood Alcohol level:  Lab Results  Component Value Date   ETH <5 04/23/2016   ETH <5 83/15/1761    Metabolic Disorder Labs: No results found for: HGBA1C, MPG No results found for: PROLACTIN No results found for: CHOL, TRIG, HDL, CHOLHDL, VLDL, LDLCALC  Physical Findings: AIMS: Facial and Oral Movements Muscles of Facial Expression: None, normal Lips and Perioral Area: None, normal Jaw: None, normal Tongue: None, normal,Extremity Movements Upper (arms,  wrists, hands, fingers): None, normal Lower (legs, knees, ankles, toes): None, normal, Trunk Movements Neck, shoulders, hips: None, normal, Overall Severity Severity of abnormal movements (highest score from questions above): None, normal Incapacitation due to abnormal movements: None, normal Patient's awareness of abnormal movements (rate only patient's report): No Awareness, Dental Status Current problems with teeth and/or dentures?: No Does patient usually wear dentures?: No  CIWA:    COWS:     Musculoskeletal: Strength & Muscle Tone: normal  Gait & Station: walks slowly  due to pain and history of toe amputation  Patient leans: N/A  Psychiatric Specialty Exam: Physical Exam  Nursing note and vitals reviewed.   Review of Systems  Musculoskeletal: Positive for back pain, joint pain and neck pain.  Psychiatric/Behavioral: Positive for depression. The patient is nervous/anxious.   All other systems reviewed and are negative. chronic pain and discomfort related to RA. Denies vomiting, no fever, no chills, no rash   Blood pressure 121/69, pulse 82, temperature 97.8 F (36.6 C), temperature source Oral, resp. rate 18, height _0  (1.626 m), weight 46.3 kg (102 lb), SpO2 100 %.Body mass index is 17.51 kg/m.  General Appearance: improved grooming   Eye Contact:  Improved   Speech:  Normal Rate  Volume:  Normal  Mood:  Mood partially improved compared to admission   Affect:  Constricted, but more reactive, also less anxious   Thought Process:  Linear   Orientation:  Full (Time, Place, and Person)  Thought Content:  Linear   Suicidal Thoughts:  No at this time denies suicidal ideations, denies any self injurious ideations , contracts for safety on the unit  Homicidal Thoughts:  No denies homicidal or violent  ideations   Memory:  Recent and remote grossly intact    Judgement:  Improving   Insight:   Improving   Psychomotor Activity:  Normal  Concentration:  Concentration: Good  and Attention Span: Good  Recall:  Good  Fund of Knowledge:  Good  Language:  Good  Akathisia:  No  Handed:  Right  AIMS (if indicated):     Assets:  Desire for Improvement  ADL's:  Needs help  Cognition:  WNL  Sleep:  Number of Hours: 6.5   Assessment - still depressed, constricted in affect, but has improved partially compared to admission and is more future oriented, denies any suicidal ideations, tolerating medications well at this time, reassured because found out her pet cat is back home, safe . Reports some increased R shoulder pain today .   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 Ruffin Frederick, MD 05/08/2016, 2:37 PM

## 2016-05-08 NOTE — Progress Notes (Signed)
Patient ID: Tara Brown, female   DOB: 12-Nov-1960, 56 y.o.   MRN: [id number] PER STATE REGULATIONS 482.30  THIS CHART WAS REVIEWED FOR MEDICAL NECESSITY WITH RESPECT TO THE PATIENT'S ADMISSION/ DURATION OF STAY.  NEXT REVIEW DATE: 05/10/2016  Willa Rough, RN, BSN CASE MANAGER

## 2016-05-08 NOTE — Progress Notes (Addendum)
D: Pt was in the day room upon initial approach.  Pt presents with depressed affect and mood.  Her goal is "accepting going to an assisted living facility."  Pt denies SI/HI, denies hallucinations.  Pt has been visible in milieu interacting with peers and staff appropriately.  Pt attended evening group.   A: Introduced self to pt.  Met with pt and offered support and encouragement.  Actively listened to pt.  Pt went to [facility] with staff member via [facility] for X-ray per order.  Awaiting results at this time.  PRN medication administered for sleep. R: Pt is compliant with medications.  Pt verbally contracts for safety.  She reports she will inform staff of needs and concerns.  Will continue to monitor and assess.

## 2016-05-08 NOTE — Progress Notes (Signed)
D: Patient is sleeping and eating well.  She rates her depression, hopelessness and anxiety as a 5.  Patient is a possible discharge today.  She is willing to rescind her 72 hour discharge if discharge does not occur.  Patient denies any thoughts of self harm.  She is attending groups and participating.  Her affect is brighter; her depressive symptoms have decreased.  Patient is currently awaiting status on an assisted living facility.   A: Continue to monitor medication management and MD orders.  Safety checks completed every 15 minutes per protocol.  Offer support and encouragement as needed. R: Patient is receptive to staff; her behavior is appropriate.

## 2016-05-08 NOTE — Progress Notes (Signed)
Recreation Therapy Notes  Date: 05/08/16 Time: 0930 Location: 300 Hall Dayroom  Group Topic: Stress Management  Goal Area(s) Addresses:  Patient will verbalize importance of using healthy stress management.  Patient will identify positive emotions associated with healthy stress management.   Intervention: Stress Management  Activity :  [place].  LRT introduced to the technique of guided imagery to the patients.  Patients were to follow along as LRT read script in order to participate in the the technique.  Education: Stress Management, Discharge Planning.   Education Outcome: Acknowledges edcuation/In group clarification offered/Needs additional education  Clinical Observations/Feedback: Pt did not attend group.    Caroll Rancher, LRT/CTRS     Caroll Rancher A 05/08/2016 12:52 PM

## 2016-05-08 NOTE — Progress Notes (Signed)
Adult Psychoeducational Group Note  Date:  05/08/2016 Time:  9:27 PM  Group Topic/Focus:  Wrap-Up Group:   The focus of this group is to help patients review their daily goal of treatment and discuss progress on daily workbooks.   Participation Level:  Active  Participation Quality:  Appropriate  Affect:  Appropriate  Cognitive:  Alert  Insight: Appropriate  Engagement in Group:  Engaged  Modes of Intervention:  Discussion  Additional Comments:  Patient states, "I had an okay day. On a scale between 1-10, (1=worse, 10=best) patient rated her day a 5. Patient stated she had no specific goal, but is coping with what is coming up. Writer asked patient what is coming up, and patient states, "getting into the nursing home". [name] L [name] 05/08/2016, 9:27 PM

## 2016-05-08 NOTE — [facility] Group Notes (Signed)
[facility] LCSW Aftercare Discharge Planning Group Note   05/08/2016 1:00 PM  Participation Quality:  Invited, did not attend.   Sallee Lange

## 2016-05-09 MED ORDER — HYDROXYZINE HCL 25 MG PO TABS
25.0000 mg | ORAL_TABLET | Freq: Two times a day (BID) | ORAL | Status: DC | PRN
  Administered 2016-05-09 – 2016-05-13 (×3): 25 mg via ORAL
  Filled 2016-05-09: qty 10
  Filled 2016-05-09 (×3): qty 1

## 2016-05-09 NOTE — Progress Notes (Signed)
Patient ID: Tara Brown, female   DOB: Jan 12, 1961, 55 y.o.   MRN: [id number] [facility] MD Progress Note  05/09/2016 2:20 PM Malyn Aytes  MRN:  [id number]   Subjective: today feeling less depressed and is more hopeful  related to. She reports anxious mood and is asking for some prn medication States, however, that she feels better because she has been told her pet cat is now inside the house again- she had been quite anxious that her cat had been outside for a few days, and worried it would get lost or hurt, states that now that she knows it is safe she feels much better  Objective: I and Education officer, museum  met with patient and have reviewed chart notes. Patient rescinds her 72 hour request for discharge. Social Worker is looking for placement for patient because of chronic medical condition that prevents patient from caring for self.Appointment to be made with PCP with regards to xray changes in her right shoulder from yesterday. Less pain in rt shoulder today. Less depressed, but affect is reactive, fuller in range, smiles at times appropriately, grooming is improved today  Ruminates about her sisters not visiting her in hospital and about limited family support Denies suicidal ideations at this time As noted, reports some increased R shoulder pain, discomfort. Does not appear to be in any acute distress  Reassured by pet cat being safe as above  Motivated in continuing process to go to an ALF  No  disruptive or agitated behavior on unit Denies medication side effects    Principal Problem: Major depressive disorder, recurrent severe without psychotic features ([place]) Diagnosis:   Patient Active Problem List   Diagnosis Date Noted  . Back pain [M54.9] 04/25/2016  . Major depressive disorder, recurrent severe without psychotic features ([place]) [F33.2] 04/24/2016  . Left foot infection [L08.9] 12/06/2015  . Suicidal ideations [R45.851] 12/06/2015  . Rheumatoid arthritis ([place]) [M06.9] 12/06/2015  .  Foot infection [L08.9] 12/05/2015  . Cellulitis of toe, left [L03.032]   . Chronic osteomyelitis ([place]) [M86.60] 09/07/2015  . Anemia, iron deficiency [D50.9]   . Essential hypertension [I10]   . Anemia of chronic disease [D63.8] 03/08/2015   Total Time spent with patient: 20 minutes   Past Psychiatric History: Please see H&P.   Past Medical History:  Past Medical History:  Diagnosis Date  . Arthritis   . Major depressive disorder, recurrent, severe without psychotic features ([place]) 03/11/2015    Past Surgical History:  Procedure Laterality Date  . AMPUTATION Left 12/07/2015   Procedure: AMPUTATION LEFT GREAT TOE;  Surgeon: Newt Minion, MD;  Location: [facility];  Service: Orthopedics;  Laterality: Left;  . FOOT SURGERY    . HAND SURGERY    . HIP FRACTURE SURGERY    . KNEE SURGERY    . NECK SURGERY     Family History:  Family History  Problem Relation Age of Onset  . Bipolar disorder Sister   . Suicidality Other   . Schizophrenia Other   . Stroke Neg Hx    Family Psychiatric  History:Please see H&P.  Social History:  History  Alcohol Use  . Yes    Comment: Rarely     History  Drug Use No    Social History   Social History  . Marital status: Divorced    Spouse name: N/A  . Number of children: N/A  . Years of education: N/A   Social History Main Topics  . Smoking status: Never Smoker  . Smokeless  tobacco: Never Used  . Alcohol use Yes     Comment: Rarely  . Drug use: No  . Sexual activity: No   Other Topics Concern  . None   Social History Narrative  . None   Additional Social History:   Sleep: improved   Appetite: fair   Current Medications: Current Facility-Administered Medications  Medication Dose Route Frequency Provider Last Rate Last Dose  . alum & mag hydroxide-simeth (MAALOX/MYLANTA) 200-200-20 MG/5ML suspension 30 mL  30 mL Oral Q4H PRN Patrecia Pour, NP      . calcium-vitamin D (OSCAL WITH D) 500-200 MG-UNIT per tablet 1 tablet  1  tablet Oral BID Patrecia Pour, NP   1 tablet at 05/09/16 [phone]  . docusate sodium (COLACE) capsule 100 mg  100 mg Oral Daily Laverle Hobby, [name]   100 mg at 05/09/16 2671  . DULoxetine (CYMBALTA) DR capsule 60 mg  60 mg Oral Daily Sueanne Margarita, MD   60 mg at 05/09/16 0803  . feeding supplement (ENSURE ENLIVE) (ENSURE ENLIVE) liquid 237 mL  237 mL Oral BID BM Jenne Campus, MD   237 mL at 05/09/16 0803  . ferrous sulfate tablet 325 mg  325 mg Oral BID WC Ursula Alert, MD   325 mg at 05/09/16 0803  . folic acid (FOLVITE) tablet 1 mg  1 mg Oral Daily Patrecia Pour, NP   1 mg at 05/09/16 2458  . hydrALAZINE (APRESOLINE) tablet 10 mg  10 mg Oral Q8H PRN Kerrie Buffalo, NP      . lidocaine (LIDODERM) 5 % 2 patch  2 patch Transdermal Q24H Ursula Alert, MD   2 patch at 05/09/16 1332  . magnesium hydroxide (MILK OF MAGNESIA) suspension 30 mL  30 mL Oral Daily PRN Patrecia Pour, NP      . naproxen (NAPROSYN) tablet 375 mg  375 mg Oral BID PRN Ursula Alert, MD   375 mg at 04/27/16 1546  . predniSONE (DELTASONE) tablet 10 mg  10 mg Oral Daily Patrecia Pour, NP   10 mg at 05/09/16 0803  . traMADol (ULTRAM) tablet 100 mg  100 mg Oral Q6H Jenne Campus, MD   100 mg at 05/09/16 1212  . traZODone (DESYREL) tablet 100 mg  100 mg Oral QHS PRN Jenne Campus, MD   100 mg at 05/08/16 2302    Lab Results:  No results found for this or any previous visit (from the past 9 hour(s)).  Blood Alcohol level:  Lab Results  Component Value Date   ETH <5 04/23/2016   ETH <5 09/98/3382    Metabolic Disorder Labs: No results found for: HGBA1C, MPG No results found for: PROLACTIN No results found for: CHOL, TRIG, HDL, CHOLHDL, VLDL, LDLCALC  Physical Findings: AIMS: Facial and Oral Movements Muscles of Facial Expression: None, normal Lips and Perioral Area: None, normal Jaw: None, normal Tongue: None, normal,Extremity Movements Upper (arms, wrists, hands, fingers): None, normal Lower (legs,  knees, ankles, toes): None, normal, Trunk Movements Neck, shoulders, hips: None, normal, Overall Severity Severity of abnormal movements (highest score from questions above): None, normal Incapacitation due to abnormal movements: None, normal Patient's awareness of abnormal movements (rate only patient's report): No Awareness, Dental Status Current problems with teeth and/or dentures?: No Does patient usually wear dentures?: No  CIWA:    COWS:     Musculoskeletal: Strength & Muscle Tone: normal  Gait & Station: walks slowly due to pain and history of  toe amputation  Patient leans: N/A  Psychiatric Specialty Exam: Physical Exam  Nursing note and vitals reviewed.   Review of Systems  Musculoskeletal: Positive for back pain, joint pain and neck pain.  Psychiatric/Behavioral: Positive for depression. The patient is nervous/anxious.   All other systems reviewed and are negative. chronic pain and discomfort related to RA. Denies vomiting, no fever, no chills, no rash   Blood pressure 127/76, pulse 89, temperature 98.1 F (36.7 C), temperature source Oral, resp. rate 18, height _0  (1.626 m), weight 46.3 kg (102 lb), SpO2 100 %.Body mass index is 17.51 kg/m.  General Appearance: improved grooming   Eye Contact:  Improved   Speech:  Normal Rate  Volume:  Normal  Mood:  Mood partially improved compared to admission   Affect:  Constricted, but more reactive, also less anxious   Thought Process:  Linear   Orientation:  Full (Time, Place, and Person)  Thought Content:  Linear   Suicidal Thoughts:  No at this time denies suicidal ideations, denies any self injurious ideations , contracts for safety on the unit  Homicidal Thoughts:  No denies homicidal or violent  ideations   Memory:  Recent and remote grossly intact    Judgement:  Improving   Insight:   Improving   Psychomotor Activity:  Normal  Concentration:  Concentration: Good and Attention Span: Good  Recall:  Good  Fund of  Knowledge:  Good  Language:  Good  Akathisia:  No  Handed:  Right  AIMS (if indicated):     Assets:  Desire for Improvement  ADL's:  Needs help  Cognition:  WNL  Sleep:  Number of Hours: 6   Assessment - still depressed, constricted in affect, but has improved partially compared to admission and is more future oriented, denies any suicidal ideations, tolerating medications well at this time, reassured because found out her pet cat is back home, safe . Reports some increased R shoulder pain today .   Treatment Plan Summary: Encourage ongoing group and milieu participation to work on coping skills and symptom reduction   Start vistaril 12m bid prn for anxiety Continue Cymbalta 60 mgrs QDAY for depression , anxiety  Continue  Trazodone 100 mg  QHS   PRN for sleep as needed   Increase Tramadol to Q 6 hours for pain management  Continue Lidoderm patch for pain management  Continue Prednisone 10 mgrs QDAY for management of Autoimmune Disorder  Treatment team working on disposition planning- see above  Will request R shoulder X ray. URuffin Frederick MD 05/09/2016, 2:20 PM

## 2016-05-09 NOTE — [facility] Group Notes (Signed)
[facility] LCSW Group Therapy 05/09/2016 1:15 PM  Type of Therapy: Group Therapy- Feelings about Diagnosis  Participation Level: Active   Participation Quality:  Appropriate  Affect:  Appropriate  Cognitive: Alert and Oriented   Insight:  Developing   Engagement in Therapy: Developing/Improving and Engaged   Modes of Intervention: Clarification, Confrontation, Discussion, Education, Exploration, Limit-setting, Orientation, Problem-solving, Rapport Building, Dance movement psychotherapist, Socialization and Support  Description of Group:   This group will allow patients to explore their thoughts and feelings about diagnoses they have received. Patients will be guided to explore their level of understanding and acceptance of these diagnoses. Facilitator will encourage patients to process their thoughts and feelings about the reactions of others to their diagnosis, and will guide patients in identifying ways to discuss their diagnosis with significant others in their lives. This group will be process-oriented, with patients participating in exploration of their own experiences as well as giving and receiving support and challenge from other group members.  Summary of Progress/Problems:  Pt participated in group discussion and was able to discuss symptoms and stigma.  Therapeutic Modalities:   Cognitive Behavioral Therapy Solution Focused Therapy Motivational Interviewing Relapse Prevention Therapy  Chad Cordial, LCSWA 05/09/2016 4:59 PM

## 2016-05-09 NOTE — Plan of Care (Signed)
Problem: Education: Goal: Emotional status will improve Outcome: Progressing Patient is accepting her eventual discharge to a skilled nursing facility.

## 2016-05-09 NOTE — Progress Notes (Addendum)
D: Pt. could not state a goal. Pt. is up and visible in the room. Denies having any SI/HI and AVH at this time. Rates pain as 6/10, a manageable pain level is 3/10. Pt. is depressed, sad, and worried about her cat and living arrangement. She states that "my sister wants me to be in [place]".   A: Encouragement and support given. Medications ordered and given. PRN Trazodone requested and given. Will re-eval as necessary.   R: Safety maintained with Q 15 checks. Continues to follow treatment plan and will monitor closely.    Signed by: Tyrone Apple, RN

## 2016-05-09 NOTE — Plan of Care (Signed)
Problem: Health Behavior/Discharge Planning: Goal: Identification of resources available to assist in meeting health care needs will improve Outcome: Progressing Treatment team is working to find appropriate placement and have discussed with Pt. on progress.

## 2016-05-09 NOTE — Progress Notes (Signed)
Patient ID: Tara Brown, female   DOB: [birth year], 55 y.o.   MRN: [id number] D: Patient has been up in the milieu.  She is interacting well with peers and staff.  She is awaiting information on an assisted living facility.  Patient rates her depression, hopelessness and anxiety as a 6.  Staff assisted her with a bath and patient was appreciative.  She requested anxiety medication and it was given.  She denies any thoughts of self harm.  Her affect is brighter today. A: Continue to monitor medication management and MD orders.  Safety checks completed every 15 minutes per protocol. Offer support and encouragement as needed. R: Patient is receptive to staff; her behavior is appropriate.

## 2016-05-09 NOTE — [facility] Group Notes (Signed)
[facility] Group Notes:  (Nursing/MHT/Case Management/Adjunct)  Date:  05/09/2016  Time:  0900 am  Type of Therapy:  Psychoeducational Skills  Participation Level:  Active  Participation Quality:  Appropriate and Attentive  Affect:  Appropriate  Cognitive:  Alert and Appropriate  Insight:  Improving  Engagement in Group:  Engaged and Supportive  Modes of Intervention:  Support  Summary of Progress/Problems: Patient was supportive of peers.  She listened attentively and asked appropriate questions.  Patient is awaiting information on skilled nursing facility.    Cranford Mon 05/09/2016, 10:20 AM

## 2016-05-09 NOTE — Progress Notes (Signed)
Adult Psychoeducational Group Note  Date:  05/09/2016 Time:  9:43 PM  Group Topic/Focus:  Wrap-Up Group:   The focus of this group is to help patients review their daily goal of treatment and discuss progress on daily workbooks.   Participation Level:  Did Not Attend  Participation Quality:  Did not attend  Affect:  Did not attend  Cognitive:  Did not attend  Insight: None  Engagement in Group:  Did not attend  Modes of Intervention:  Did not attend  Additional Comments:  Patient did not attend wrap up group tonight.  Dontray Haberland L Gennette Shadix 05/09/2016, 9:43 PM

## 2016-05-09 NOTE — [facility] Group Notes (Signed)
Tara Brown was invited to group on grief and loss.  Did not attend.

## 2016-05-10 MED ORDER — HYDRALAZINE HCL 10 MG PO TABS: 10.0000 mg | ORAL_TABLET | Freq: Three times a day (TID) | ORAL | Status: DC | PRN

## 2016-05-10 NOTE — Progress Notes (Signed)
Patient ID: Tara Brown, female   DOB: [birth year], 55 y.o.   MRN: [id number] PER STATE REGULATIONS 482.30  THIS CHART WAS REVIEWED FOR MEDICAL NECESSITY WITH RESPECT TO THE PATIENT'S ADMISSION/ DURATION OF STAY.  NEXT REVIEW DATE: 05/14/2016 Willa Rough, RN, BSN CASE MANAGER

## 2016-05-10 NOTE — Progress Notes (Signed)
Recreation Therapy Notes   Animal-Assisted Activity (AAA) Program Checklist/Progress Notes Patient Eligibility Criteria Checklist & Daily Group note for Rec TxIntervention  Date: 08.29.2017 Time: 2:45pm Location: [address]   AAA/T Program Assumption of Risk Form signed by Patient/ or Parent Legal Guardian Yes  Patient is free of allergies or sever asthma Yes  Patient reports no fear of animals Yes  Patient reports no history of cruelty to animals Yes  Patient understands his/her participation is voluntary Yes  Patient washes hands before animal contact Yes  Patient washes hands after animal contact Yes  Behavioral Response: Engaged, Attentive, Appropriate   Education:Hand Washing, Appropriate Animal Interaction   Education Outcome: Acknowledges education.   Clinical Observations/Feedback: Patient attended session and interacted appropriately with therapy dog and peers. Patient asked appropriate questions about therapy dog and his training.    [name] Tanza Pellot, LRT/CTRS  Algenis Ballin L 05/10/2016 8:24 AM

## 2016-05-10 NOTE — Progress Notes (Signed)
Recreation Therapy Notes  Date: 05/10/16 Time: 0930 Location: [address]  Group Topic: Stress Management  Goal Area(s) Addresses:  Patient will verbalize importance of using healthy stress management.  Patient will identify positive emotions associated with healthy stress management.   Intervention: Stress Management   Activity :  Progressive Muscle Relaxation.  LRT introduced to technique of progressive muscle relaxation to patients.  LRT read a script to guide the patients through the exercise.  Patients were to follow along as LRT read the script to engage in the activity.  Education:  Stress Management, Discharge Planning.   Education Outcome: Needs additional education  Clinical Observations/Feedback:  Pt did not attend group.    Caroll Rancher, LRT/CTRS         Caroll Rancher A 05/10/2016 3:04 PM

## 2016-05-10 NOTE — [facility] Group Notes (Signed)
[facility] LCSW Group Therapy 05/10/2016 1:15 PM  Type of Therapy: Group Therapy- Emotion Regulation  Participation Level: Active   Participation Quality:  Appropriate  Affect: Appropriate  Cognitive: Alert and Oriented   Insight:  Developing/Improving  Engagement in Therapy: Developing/Improving and Engaged   Modes of Intervention: Clarification, Confrontation, Discussion, Education, Exploration, Limit-setting, Orientation, Problem-solving, Rapport Building, Dance movement psychotherapist, Socialization and Support  Summary of Progress/Problems: The topic for group today was emotional regulation. This group focused on both positive and negative emotion identification and allowed group members to process ways to identify feelings, regulate negative emotions, and find healthy ways to manage internal/external emotions. Group members were asked to reflect on a time when their reaction to an emotion led to a negative outcome and explored how alternative responses using emotion regulation would have benefited them. Group members were also asked to discuss a time when emotion regulation was utilized when a negative emotion was experienced. Pt participated in group discussion and interacted well with peers. Pt reports that she would like to incorporate more music based therapy at home.   Chad Cordial, LCSWA 05/10/2016 5:18 PM

## 2016-05-10 NOTE — Progress Notes (Signed)
Patient ID: Tara Brown, female   DOB: 1960-09-19, 55 y.o.   MRN: [id number]  DAR: Pt. Denies SI/HI and A/V Hallucinations. She reports sleep is good, appetite is good, energy level is low, and concentration is good. She rates depression 6/10, hopelessness 6/10, and anxiety 5/10. She continues to need some assistance with taking medications and ADL's. She states she her goal for the day is to speak with MD about discharge. Patient does continue to report pain in back and is receiving scheduled medications for this which provide some relief. Support and encouragement provided to the patient to come to staff with questions, concerns, or if she needs help with ADL's. Scheduled medications administered to patient per physician's orders. She is minimal but cooperative mainly staying in her room throughout the day laying in bed however is seen intermittently in the milieu. Q15 minute checks are maintained for safety.

## 2016-05-10 NOTE — Progress Notes (Signed)
Adult Psychoeducational Group Note  Date:  05/10/2016 Time:  11:43 PM  Group Topic/Focus:  Building Self Esteem:   The Focus of this group is helping patients become aware of the effects of self-esteem on their lives, the things they and others do that enhance or undermine their self-esteem, seeing the relationship between their level of self-esteem and the choices they make and learning ways to enhance self-esteem.   Participation Level:  Active  Participation Quality:  Appropriate  Affect:  Appropriate  Cognitive:  Appropriate  Insight: Appropriate  Engagement in Group:  Engaged  Modes of Intervention:  Discussion  Additional Comments:  Patient attended group and said that her day was a 5.  She was anxious because she will be discharged tomorrow and she was not sure about her housing arrangements.  Therefore, that will be her goal for tomorrow.  Marland Reine W Brittan Butterbaugh 05/10/2016, 11:43 PM

## 2016-05-11 MED ORDER — DOCUSATE SODIUM 100 MG PO CAPS
100.0000 mg | ORAL_CAPSULE | Freq: Two times a day (BID) | ORAL | Status: DC
  Administered 2016-05-11 – 2016-05-15 (×8): 100 mg via ORAL
  Filled 2016-05-11 (×12): qty 1

## 2016-05-11 NOTE — Progress Notes (Signed)
Adult Psychoeducational Group Note  Date:  05/11/2016 Time:  6:55 PM  Group Topic/Focus:  Personal Choices and Values:   The focus of this group is to help patients assess and explore the importance of values in their lives, how their values affect their decisions, how they express their values and what opposes their expression.   Participation Level:  Active  Participation Quality:  Appropriate  Affect:  Appropriate  Cognitive:  Appropriate  Insight: Appropriate  Engagement in Group:  Engaged  Modes of Intervention:  Discussion  Additional Comments:  Pt states she was having a below average day because some of the things she is facing.  Pt states a lifestyle and leisure change she wants to do is play bingo. Wynema Birch D 05/11/2016, 6:55 PM

## 2016-05-11 NOTE — Tx Team (Signed)
Interdisciplinary Treatment Plan Update (Adult) Date: 05/11/2016   Date: 05/11/2016 1:23 PM  Progress in Treatment:  Attending groups: Yes Participating in groups: Minimally Taking medication as prescribed: Yes  Tolerating medication: Yes  Family/Significant othe contact made: Yes with sister and father Patient understands diagnosis: Yes AEB seeking help with depression Discussing patient identified problems/goals with staff: Yes  Medical problems stabilized or resolved: Yes  Denies suicidal/homicidal ideation: Yes Patient has not harmed self or Others: Yes   New problem(s) identified: None identified at this time.   Discharge Plan or Barriers: CSW will assess for appropriate discharge plan and relevant barriers.   04/28/16: Pt will return home and follow-up with outpatient services.   05/03/16: ALF placement started; PASRR pending; FL2 created and CSW will begin seeking placement in [facility] ALF.  05/08/16: CSW continues to seek placement in ALF facility; PASRR received and FL2 sent to various facilities.   05/11/16: ALF placement search continues  Additional comments:  Patient and CSW reviewed pt's identified goals and treatment plan. Patient verbalized understanding and agreed to treatment plan.   Reason for Continuation of Hospitalization:  Depression Medication stabilization Suicidal ideation  Estimated length of stay: 2-3 days  Review of initial/current patient goals per problem list:   1.  Goal(s): Patient will participate in aftercare plan  Met:  Progressing  Target date: 3-5 days from date of admission   As evidenced by: Patient will participate within aftercare plan AEB aftercare provider and housing plan at discharge being identified.  04/25/16: CSW to work with Pt to assess for appropriate discharge plan and faciliate appointments and referrals as needed prior to d/c. 04/28/16: Pt will return home and follow-up with outpatient services.  05/03/16: ALF  placement started; PASRR pending; FL2 created and CSW will begin seeking placement in [facility] ALF. 05/08/16: CSW continues to seek placement in ALF facility; PASRR received and FL2 sent to various facilities.  8/31: CSW continues to seek placement; difficult to find facility willing to take Medicaid payor  2.  Goal (s): Patient will exhibit decreased depressive symptoms and suicidal ideations.  Met:  Progressing   Target date: 3-5 days from date of admission   As evidenced by: Patient will utilize self rating of depression at 3 or below and demonstrate decreased signs of depression or be deemed stable for discharge by MD. 04/25/16: Pt was admitted with symptoms of depression, rating 10/10. Pt continues to present with flat affect and depressive symptoms.  Pt will demonstrate decreased symptoms of depression and rate depression at 3/10 or lower prior to discharge. 04/28/16: Pt rates depression at 5/10; denies SI 05/03/16: Pt rates depression at 5/10; denies SI 05/08/16: Pt rates depression at 4/10; denies SI 05/11/16: Pt rates depression at 6/10; denies SI  Attendees:  Patient:    Family:    Physician: Dr. Julieanne Manson, MD  05/11/2016 1:23 PM  Nursing: Gaylan Gerold, RN; Darrol Angel, RN 05/11/2016 1:23 PM  Clinical Social Worker Peri Maris, [place] 05/11/2016 1:23 PM  Other: Tilden Fossa, [place] 05/11/2016 1:23 PM  Clinical: Lars Pinks, RN Case manager  05/11/2016 1:23 PM  Other:  05/11/2016 1:23 PM  Other:     Peri Maris, [place] Work [phone]

## 2016-05-11 NOTE — Progress Notes (Signed)
D: Pt. goal for today is to "go home". Pt. is up and visible in the milieu. Denies having any SI/HI and AVH at this time. Rates pain as 6/10, manageable pain level is 3/10. Pt. is flat and depressed. She states she hopes to be D/C so that she can take care of her cat. She states that she is unsure about living arrangement at this time but hopes treatment team will cont. to follow.   A: Encouragement and support given. Meds. ordered and given. PRN Trazadone requested and given. Will re-eval as necessary.   R: Safety maintained with Q 15 checks. Continues to follow treatment plan and will monitor closely.    Signed by: Tyrone Apple, RN

## 2016-05-11 NOTE — [facility] Group Notes (Signed)
[facility] Group Notes:  (Nursing/MHT/Case Management/Adjunct)  Date:  05/11/2016  Time:  9:35 AM  Type of Therapy:  Psychoeducational Skills  Participation Level:  Active  Participation Quality:  Attentive  Affect:  Blunted  Cognitive:  Alert  Insight:  Limited  Engagement in Group:  Engaged  Modes of Intervention:  Discussion and Education  Summary of Progress/Problems: Patient attended group and was engaged   Marzetta Board E 05/11/2016, 9:35 AM

## 2016-05-11 NOTE — Progress Notes (Signed)
Patient ID: Tara Brown, female   DOB: [birth year], 55 y.o.   MRN: [id number]  Writer walked to patient's room to let patient know that MD Arfeen had been paged twice but no response at this time but writer will continue to follow up. Patient appeared guarded in interaction and stated, "don't worry about it then." Patient is aware Naproxen is available.

## 2016-05-11 NOTE — [facility] Group Notes (Signed)
[facility] [employer] Group Therapy 05/11/2016 1:15pm  Type of Therapy: Mental Health Association Presentation  Pt did not attend, declined invitation.    Chad Cordial, LCSWA 05/11/2016 1:26 PM

## 2016-05-11 NOTE — Progress Notes (Signed)
Patient ID: Tara Brown, female   DOB: [birth year], 55 y.o.   MRN: [id number]  DAR: Pt. Denies SI/HI and A/V Hallucinations. She reports sleep is good, appetite is fair, energy level is low, and concentration is good. She rates depression, anxiety, and hopelessness 5/10. She does report chronic back and shoulder pain throughout the day. After dinner patient came to writer and stated, "it's a nine." Patient was referring to pain level 9/10. An hour previously she received scheduled Tramadol. She was offered PRN Naproxen however patient refused stating, "can you call and get a one time order for something stronger?" Nursing staff paged on call provider MD Arfeen twice. No response at this time. Will continue to follow up. Support and encouragement provided to the patient. Assistance is provided to patient by nursing staff as needed. Patient is receptive and cooperative. Q15 minute checks are maintained for safety.

## 2016-05-11 NOTE — Plan of Care (Signed)
Problem: Coping: Goal: Ability to verbalize feelings will improve Outcome: Progressing Pt. is able to express concerns and feelings to nurse.

## 2016-05-11 NOTE — Progress Notes (Signed)
Patient ID: Tara Brown, female   DOB: 12/10/1960, 55 y.o.   MRN: [id number] [facility] MD Progress Note  05/11/2016 4:08 PM Kaliya Shreiner  MRN:  [id number]   Subjective: today feeling less depressed and is more hopeful  related to physical disabilityStaffed patient at treatment team, SW still waiting for a call back from Assisted Living Facilities  Objective:  Motivated in continuing process to go to an ALF  No  disruptive or agitated behavior on unit Denies medication side effects    Principal Problem: Major depressive disorder, recurrent severe without psychotic features (HCC) Diagnosis:   Patient Active Problem List   Diagnosis Date Noted  . Back pain [M54.9] 04/25/2016  . Major depressive disorder, recurrent severe without psychotic features (HCC) [F33.2] 04/24/2016  . Left foot infection [L08.9] 12/06/2015  . Suicidal ideations [R45.851] 12/06/2015  . Rheumatoid arthritis (HCC) [M06.9] 12/06/2015  . Foot infection [L08.9] 12/05/2015  . Cellulitis of toe, left [L03.032]   . Chronic osteomyelitis (HCC) [M86.60] 09/07/2015  . Anemia, iron deficiency [D50.9]   . Essential hypertension [I10]   . Anemia of chronic disease [D63.8] 03/08/2015   Total Time spent with patient: 20 minutes   Past Psychiatric History: Please see H&P.   Past Medical History:  Past Medical History:  Diagnosis Date  . Arthritis   . Major depressive disorder, recurrent, severe without psychotic features (HCC) 03/11/2015    Past Surgical History:  Procedure Laterality Date  . AMPUTATION Left 12/07/2015   Procedure: AMPUTATION LEFT GREAT TOE;  Surgeon: Nadara Mustard, MD;  Location: [facility] ORS;  Service: Orthopedics;  Laterality: Left;  . FOOT SURGERY    . HAND SURGERY    . HIP FRACTURE SURGERY    . KNEE SURGERY    . NECK SURGERY     Family History:  Family History  Problem Relation Age of Onset  . Bipolar disorder Sister   . Suicidality Other   . Schizophrenia Other   . Stroke Neg Hx    Family  Psychiatric  History:Please see H&P.  Social History:  History  Alcohol Use  . Yes    Comment: Rarely     History  Drug Use No    Social History   Social History  . Marital status: Divorced    Spouse name: N/A  . Number of children: N/A  . Years of education: N/A   Social History Main Topics  . Smoking status: Never Smoker  . Smokeless tobacco: Never Used  . Alcohol use Yes     Comment: Rarely  . Drug use: No  . Sexual activity: No   Other Topics Concern  . None   Social History Narrative  . None   Additional Social History:   Sleep: improved   Appetite: fair   Current Medications: Current Facility-Administered Medications  Medication Dose Route Frequency Provider Last Rate Last Dose  . alum & mag hydroxide-simeth (MAALOX/MYLANTA) 200-200-20 MG/5ML suspension 30 mL  30 mL Oral Q4H PRN Charm Rings, NP      . calcium-vitamin D (OSCAL WITH D) 500-200 MG-UNIT per tablet 1 tablet  1 tablet Oral BID Charm Rings, NP   1 tablet at 05/11/16 0757  . docusate sodium (COLACE) capsule 100 mg  100 mg Oral BID Adonis Brook, NP      . DULoxetine (CYMBALTA) DR capsule 60 mg  60 mg Oral Daily Molinda Bailiff, MD   60 mg at 05/11/16 0757  . feeding supplement (ENSURE ENLIVE) (ENSURE ENLIVE) liquid  237 mL  237 mL Oral BID BM Rockey Situ Cobos, MD   237 mL at 05/11/16 1500  . ferrous sulfate tablet 325 mg  325 mg Oral BID WC Jomarie Longs, MD   325 mg at 05/11/16 0757  . folic acid (FOLVITE) tablet 1 mg  1 mg Oral Daily Charm Rings, NP   1 mg at 05/11/16 0757  . hydrALAZINE (APRESOLINE) tablet 10 mg  10 mg Oral Q8H PRN Molinda Bailiff, MD      . hydrOXYzine (ATARAX/VISTARIL) tablet 25 mg  25 mg Oral BID PRN Molinda Bailiff, MD   25 mg at 05/09/16 1604  . lidocaine (LIDODERM) 5 % 2 patch  2 patch Transdermal Q24H Jomarie Longs, MD   2 patch at 05/11/16 1202  . magnesium hydroxide (MILK OF MAGNESIA) suspension 30 mL  30 mL Oral Daily PRN Charm Rings, NP      . naproxen  (NAPROSYN) tablet 375 mg  375 mg Oral BID PRN Jomarie Longs, MD   375 mg at 04/27/16 1546  . predniSONE (DELTASONE) tablet 10 mg  10 mg Oral Daily Charm Rings, NP   10 mg at 05/11/16 0757  . traMADol (ULTRAM) tablet 100 mg  100 mg Oral Q6H Craige Cotta, MD   100 mg at 05/11/16 1202  . traZODone (DESYREL) tablet 100 mg  100 mg Oral QHS PRN Craige Cotta, MD   100 mg at 05/10/16 2127    Lab Results:  No results found for this or any previous visit (from the past 48 hour(s)).  Blood Alcohol level:  Lab Results  Component Value Date   ETH <5 04/23/2016   ETH <5 08/31/2015    Metabolic Disorder Labs: No results found for: HGBA1C, MPG No results found for: PROLACTIN No results found for: CHOL, TRIG, HDL, CHOLHDL, VLDL, LDLCALC  Physical Findings: AIMS: Facial and Oral Movements Muscles of Facial Expression: None, normal Lips and Perioral Area: None, normal Jaw: None, normal Tongue: None, normal,Extremity Movements Upper (arms, wrists, hands, fingers): None, normal Lower (legs, knees, ankles, toes): None, normal, Trunk Movements Neck, shoulders, hips: None, normal, Overall Severity Severity of abnormal movements (highest score from questions above): None, normal Incapacitation due to abnormal movements: None, normal Patient's awareness of abnormal movements (rate only patient's report): No Awareness, Dental Status Current problems with teeth and/or dentures?: No Does patient usually wear dentures?: No  CIWA:    COWS:     Musculoskeletal: Strength & Muscle Tone: normal  Gait & Station: walks slowly due to pain and history of toe amputation  Patient leans: N/A  Psychiatric Specialty Exam: Physical Exam  Nursing note and vitals reviewed.   Review of Systems  Musculoskeletal: Positive for back pain, joint pain and neck pain.  Psychiatric/Behavioral: Positive for depression. The patient is nervous/anxious.   All other systems reviewed and are negative. chronic pain  and discomfort related to RA. Denies vomiting, no fever, no chills, no rash   Blood pressure 115/76, pulse 79, temperature 98.5 F (36.9 C), temperature source Oral, resp. rate 16, height 5\' 4"  (1.626 m), weight 46.3 kg (102 lb), SpO2 100 %.Body mass index is 17.51 kg/m.  General Appearance: improved grooming   Eye Contact:  Improved   Speech:  Normal Rate  Volume:  Normal  Mood:  Mood partially improved compared to admission   Affect:  Constricted, but more reactive, also less anxious   Thought Process:  Linear   Orientation:  Full (Time, Place,  and Person)  Thought Content:  Linear   Suicidal Thoughts:  No at this time denies suicidal ideations, denies any self injurious ideations , contracts for safety on the unit  Homicidal Thoughts:  No denies homicidal or violent  ideations   Memory:  Recent and remote grossly intact    Judgement:  Improving   Insight:   Improving   Psychomotor Activity:  Normal  Concentration:  Concentration: Good and Attention Span: Good  Recall:  Good  Fund of Knowledge:  Good  Language:  Good  Akathisia:  No  Handed:  Right  AIMS (if indicated):     Assets:  Desire for Improvement  ADL's:  Needs help  Cognition:  WNL  Sleep:  Number of Hours: 5.5   Assessment - still depressed, constricted in affect, but has improved partially compared to admission and is more future oriented, denies any suicidal ideations, tolerating medications well at this time, reassured because found out her pet cat is back home, safe . Reports some increased R shoulder pain today .   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 , MD 05/11/2016, 4:08 PM

## 2016-05-12 NOTE — Progress Notes (Signed)
[facility] Group Notes:  (Nursing/MHT/Case Management/Adjunct)  Date:  05/12/2016  Time:  12:11 AM  Type of Therapy:  Psychoeducational Skills  Participation Level:  Minimal  Participation Quality:  Attentive  Affect:  Flat  Cognitive:  Lacking  Insight:  Limited  Engagement in Group:  Limited  Modes of Intervention:  Education  Summary of Progress/Problems: The patient described her day as having been "up and down". She did not provide any additional details. Her goal for tomorrow is to get discharged.   Scot Shiraishi S 05/12/2016, 12:11 AM

## 2016-05-12 NOTE — Clinical Social Work Note (Signed)
On 8/31 in afternoon, CSW provided patient w contact information for housing case manager at [facility] for continued assistance w locating housing appropriate to patient's needs.  Pt ripped up note, stating "they are of no help to me."  Was not interested in recontacting case manager for assistance.  CSW reinforced importance of working w community resources to find options for community living w support needed by patient, patient voiced discouragement w lengthiness of process.   Santa Genera, LCSW Lead Clinical Social Worker Phone:  [phone]

## 2016-05-12 NOTE — Progress Notes (Signed)
Patient ID: Tara Brown, female   DOB: 1961/01/09, 55 y.o.   MRN: [id number] D: Patient in room most of the evening. Pt mood and affect appeared depressed and flat. Pt reports tolerating medication well. Pt rated pain as 8 on 0-10 scale. Pt denies SI/HI/AVH. Cooperative with assessment.  A: Met with pt 1:1. Medications administered as prescribed. Support and encouragement provided to attend groups and engage in milieu. Pt encouraged to discuss feelings and come to staff with any question or concerns.  R: Patient remains safe and complaint with medications.

## 2016-05-12 NOTE — Progress Notes (Signed)
Patient ID: Tara Brown, female   DOB: 08-10-1961, 55 y.o.   MRN: [id number]  CSW spoke with Damian Leavell at [facility], they will visit patient on Tuesday 05/16/2016. Also, CSW faxed referral to [facility]. Patient states she would like to take her cat with her to the ALF. CSW explained facilities are unable to allow pets. She states "I guess my family will take her to a shelter and she will be put down. They will put her in the incinerator or whatever." She states she does not want to go to the ALF but feels like she does not have a choice.   Daisy Floro Eugean Arnott MSW, [place]  05/12/2016

## 2016-05-12 NOTE — Progress Notes (Signed)
Patient attended group, but did not wish to share.  

## 2016-05-12 NOTE — Progress Notes (Signed)
D: Tara Brown denied SI/HI/AVH. She continues to feel depressed and experience chronic pain. On her self inventory, she reported good sleep, fair appetite, low energy level, and good concentration. She rated her depression, anxiety, and feelings of hopelessness all 7/10. She is working on her discharge plans.   A: Meds given as ordered. Q15 safety checks maintained. Support/encouragement offered.  R: Pt remains free from harm and continues with treatment. Will continue to monitor for needs/safety.

## 2016-05-12 NOTE — Plan of Care (Signed)
Problem: Coping: Goal: Ability to verbalize feelings will improve Outcome: Not Progressing Pt has been isolating in room most of the evening. Pt appeared depressed and flat. Pt denies SI

## 2016-05-12 NOTE — Progress Notes (Signed)
Patient ID: Tara Brown, female   DOB: 1961/01/08, 55 y.o.   MRN: [id number] [facility] MD Progress Note  05/12/2016 10:54 AM Karleen Seebeck  MRN:  [id number]   Subjective: today feeling less depressed and is more hopeful  related to physical disability. Staffed patient at treatment team, SW still waiting for a call back from Assisted Living Facilities.SW is looking at [facility] application and getting care coordination arranged through [facility] Objective:  Motivated in continuing process to go to an ALF  No  disruptive or agitated behavior on unit Denies medication side effects    Principal Problem: Major depressive disorder, recurrent severe without psychotic features (HCC) Diagnosis:   Patient Active Problem List   Diagnosis Date Noted  . Back pain [M54.9] 04/25/2016  . Major depressive disorder, recurrent severe without psychotic features (HCC) [F33.2] 04/24/2016  . Left foot infection [L08.9] 12/06/2015  . Suicidal ideations [R45.851] 12/06/2015  . Rheumatoid arthritis (HCC) [M06.9] 12/06/2015  . Foot infection [L08.9] 12/05/2015  . Cellulitis of toe, left [L03.032]   . Chronic osteomyelitis (HCC) [M86.60] 09/07/2015  . Anemia, iron deficiency [D50.9]   . Essential hypertension [I10]   . Anemia of chronic disease [D63.8] 03/08/2015   Total Time spent with patient: 20 minutes   Past Psychiatric History: Please see H&P.   Past Medical History:  Past Medical History:  Diagnosis Date  . Arthritis   . Major depressive disorder, recurrent, severe without psychotic features (HCC) 03/11/2015    Past Surgical History:  Procedure Laterality Date  . AMPUTATION Left 12/07/2015   Procedure: AMPUTATION LEFT GREAT TOE;  Surgeon: Nadara Mustard, MD;  Location: [facility];  Service: Orthopedics;  Laterality: Left;  . FOOT SURGERY    . HAND SURGERY    . HIP FRACTURE SURGERY    . KNEE SURGERY    . NECK SURGERY     Family History:  Family History  Problem Relation Age of Onset  . Bipolar  disorder Sister   . Suicidality Other   . Schizophrenia Other   . Stroke Neg Hx    Family Psychiatric  History:Please see H&P.  Social History:  History  Alcohol Use  . Yes    Comment: Rarely     History  Drug Use No    Social History   Social History  . Marital status: Divorced    Spouse name: N/A  . Number of children: N/A  . Years of education: N/A   Social History Main Topics  . Smoking status: Never Smoker  . Smokeless tobacco: Never Used  . Alcohol use Yes     Comment: Rarely  . Drug use: No  . Sexual activity: No   Other Topics Concern  . None   Social History Narrative  . None   Additional Social History:   Sleep: improved   Appetite: fair   Current Medications: Current Facility-Administered Medications  Medication Dose Route Frequency Provider Last Rate Last Dose  . alum & mag hydroxide-simeth (MAALOX/MYLANTA) 200-200-20 MG/5ML suspension 30 mL  30 mL Oral Q4H PRN Charm Rings, NP      . calcium-vitamin D (OSCAL WITH D) 500-200 MG-UNIT per tablet 1 tablet  1 tablet Oral BID Charm Rings, NP   1 tablet at 05/12/16 [phone]  . docusate sodium (COLACE) capsule 100 mg  100 mg Oral BID Adonis Brook, NP   100 mg at 05/12/16 7846  . DULoxetine (CYMBALTA) DR capsule 60 mg  60 mg Oral Daily Molinda Bailiff, MD  60 mg at 05/12/16 0828  . feeding supplement (ENSURE ENLIVE) (ENSURE ENLIVE) liquid 237 mL  237 mL Oral BID BM Rockey Situ Cobos, MD   237 mL at 05/12/16 1003  . ferrous sulfate tablet 325 mg  325 mg Oral BID WC Jomarie Longs, MD   325 mg at 05/12/16 0828  . folic acid (FOLVITE) tablet 1 mg  1 mg Oral Daily Charm Rings, NP   1 mg at 05/12/16 5537  . hydrALAZINE (APRESOLINE) tablet 10 mg  10 mg Oral Q8H PRN Molinda Bailiff, MD      . hydrOXYzine (ATARAX/VISTARIL) tablet 25 mg  25 mg Oral BID PRN Molinda Bailiff, MD   25 mg at 05/09/16 1604  . lidocaine (LIDODERM) 5 % 2 patch  2 patch Transdermal Q24H Jomarie Longs, MD   2 patch at 05/11/16 1202  .  magnesium hydroxide (MILK OF MAGNESIA) suspension 30 mL  30 mL Oral Daily PRN Charm Rings, NP      . naproxen (NAPROSYN) tablet 375 mg  375 mg Oral BID PRN Jomarie Longs, MD   375 mg at 04/27/16 1546  . predniSONE (DELTASONE) tablet 10 mg  10 mg Oral Daily Charm Rings, NP   10 mg at 05/12/16 4827  . traMADol (ULTRAM) tablet 100 mg  100 mg Oral Q6H Craige Cotta, MD   100 mg at 05/12/16 0617  . traZODone (DESYREL) tablet 100 mg  100 mg Oral QHS PRN Craige Cotta, MD   100 mg at 05/11/16 2212    Lab Results:  No results found for this or any previous visit (from the past 48 hour(s)).  Blood Alcohol level:  Lab Results  Component Value Date   ETH <5 04/23/2016   ETH <5 08/31/2015    Metabolic Disorder Labs: No results found for: HGBA1C, MPG No results found for: PROLACTIN No results found for: CHOL, TRIG, HDL, CHOLHDL, VLDL, LDLCALC  Physical Findings: AIMS: Facial and Oral Movements Muscles of Facial Expression: None, normal Lips and Perioral Area: None, normal Jaw: None, normal Tongue: None, normal,Extremity Movements Upper (arms, wrists, hands, fingers): None, normal Lower (legs, knees, ankles, toes): None, normal, Trunk Movements Neck, shoulders, hips: None, normal, Overall Severity Severity of abnormal movements (highest score from questions above): None, normal Incapacitation due to abnormal movements: None, normal Patient's awareness of abnormal movements (rate only patient's report): No Awareness, Dental Status Current problems with teeth and/or dentures?: No Does patient usually wear dentures?: No  CIWA:    COWS:     Musculoskeletal: Strength & Muscle Tone: normal  Gait & Station: walks slowly due to pain and history of toe amputation  Patient leans: N/A  Psychiatric Specialty Exam: Physical Exam  Nursing note and vitals reviewed.   Review of Systems  Musculoskeletal: Positive for back pain, joint pain and neck pain.  Psychiatric/Behavioral:  Positive for depression. The patient is nervous/anxious.   All other systems reviewed and are negative. chronic pain and discomfort related to RA. Denies vomiting, no fever, no chills, no rash   Blood pressure 139/79, pulse 77, temperature 98 F (36.7 C), temperature source Oral, resp. rate 16, height 5\' 4"  (1.626 m), weight 46.3 kg (102 lb), SpO2 100 %.Body mass index is 17.51 kg/m.  General Appearance: improved grooming   Eye Contact:  Improved   Speech:  Normal Rate  Volume:  Normal  Mood:  Mood partially improved compared to admission   Affect:  Constricted, but more reactive, also less  anxious   Thought Process:  Linear   Orientation:  Full (Time, Place, and Person)  Thought Content:  Linear   Suicidal Thoughts:  No at this time denies suicidal ideations, denies any self injurious ideations , contracts for safety on the unit  Homicidal Thoughts:  No denies homicidal or violent  ideations   Memory:  Recent and remote grossly intact    Judgement:  Improving   Insight:   Improving   Psychomotor Activity:  Normal  Concentration:  Concentration: Good and Attention Span: Good  Recall:  Good  Fund of Knowledge:  Good  Language:  Good  Akathisia:  No  Handed:  Right  AIMS (if indicated):     Assets:  Desire for Improvement  ADL's:  Needs help  Cognition:  WNL  Sleep:  Number of Hours: 5.5   Assessment - still depressed, constricted in affect, but has improved partially compared to admission and is more future oriented, denies any suicidal ideations, tolerating medications well at this time, reassured because found out her pet cat is back home, safe . Reports some increased R shoulder pain today .   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 , MD 05/12/2016, 10:54 AM

## 2016-05-12 NOTE — Progress Notes (Signed)
Pt did not go to lunch.

## 2016-05-12 NOTE — [facility] Group Notes (Signed)
[facility] LCSW Group Therapy  05/12/2016 9:33 PM  Type of Therapy:  Group Therapy  Participation Level:  Did Not Attend  Modes of Intervention:  Discussion, Education, Socialization and Support  Summary of Progress/Problems: Feelings around Relapse. Group members discussed the meaning of relapse and shared personal stories of relapse, how it affected them and others, and how they perceived themselves during this time. Group members were encouraged to identify triggers, warning signs and coping skills used when facing the possibility of relapse. Social supports were discussed and explored in detail.  Daisy Floro Hunter Bachar MSW, LCSWA  05/12/2016, 9:33 PM

## 2016-05-12 NOTE — Progress Notes (Signed)
Recreation Therapy Notes  Date:05/12/16 Time:0930 Location: [address] Dayroom  Group Topic: Stress Management  Goal Area(s) Addresses:  Patient will verbalize importance of using healthy stress management.  Patient will identify positive emotions associated with healthy stress management.   Intervention: Stress Management  Activity: Guided Imagery Script. LRT introduced the stress management technique guided imagery. LRT read script that guided patients in how to perform the technique. Patients were to follow along as LRT read the script to engage in the technique.  Education:Stress Management, Discharge Planning.   Education Outcome:Needs additional education  Clinical Observations/Feedback:Pt did not attend group.   Jace Dowe, LRT/CTRS         Aima Mcwhirt A 05/12/2016 12:57 PM 

## 2016-05-13 NOTE — [facility] Group Notes (Signed)
[facility] Group Notes:  (Nursing/MHT/Case Management/Adjunct)  Date:  05/13/2016  Time:  10:38 AM  Type of Therapy:  Nurse Education  Participation Level:  Active  Participation Quality:  Appropriate and Attentive  Affect:  Appropriate  Cognitive:  Alert and Appropriate  Insight:  Appropriate and Good  Engagement in Group:  Engaged  Modes of Intervention:  Problem-solving  Summary of Progress/Problems:  Patient's goal today was: talk to the doctor about discharge and if I have been accepted into an assisted living facility. She did not wish to share the three words she chose to describe herself.  Almira Bar 05/13/2016, 10:38 AM

## 2016-05-13 NOTE — [facility] Group Notes (Signed)
[facility] LCSW Group Therapy  05/13/2016 / 10:10 to 11:00 AM  Type of Therapy:  Group Therapy  Participation Level:  Active  Participation Quality:  Attentive and Sharing  Affect:  Anxious  Cognitive:  Alert and Oriented  Insight:  Developing/Improving  Engagement in Therapy:  Developing/Improving  Modes of Intervention:  Discussion, Exploration, Rapport Building, Socialization and Support  Summary of Progress/Problems: The main focus of today's process group was for the patient to identify ways in which they have in the past sabotaged their own recovery. Motivational Interviewing was utilized to ask the group members what they get out of their self sabotaging behavior(s), and what reasons they may have for wanting to change.  Patient shared that she is anxious regarding placement which her family wants yet she fears she will not be able to have her pet with her. Patient shared encouragement to others as she has been here the longest and had some valid insights.  Tara Brown

## 2016-05-13 NOTE — Progress Notes (Signed)
D: Pt at the time of assessment endorsed moderate depression and anxiety; states, "I was expecting a call from this home but they did not call; I don't know if I can even find a place that would accept my cat." Pt also endorsed moderate generalized pain. Pt denies SI, HI or AVH. Pt had minimal interaction, remained calm and cooperative.  A: Medications offered as prescribed.  Support, encouragement, and safe environment provided.  15-minute safety checks continue. R: Pt was med compliant.  Pt attended wrap-up group. Safety checks continue.

## 2016-05-13 NOTE — Progress Notes (Signed)
Adult Psychoeducational Group Note  Date:  05/13/2016 Time:  9:39 PM  Group Topic/Focus:  Wrap-Up Group:   The focus of this group is to help patients review their daily goal of treatment and discuss progress on daily workbooks.   Participation Level:  Active  Participation Quality:  Appropriate  Affect:  Appropriate  Cognitive:  Appropriate  Insight: Appropriate  Engagement in Group:  Engaged  Modes of Intervention:  Discussion  Additional Comments:  Pt goal for today was to find an assisted living facility that would allow pets. Pt stated that she did not reach goal. Dniyah Grant 05/13/2016, 9:39 PM

## 2016-05-13 NOTE — Progress Notes (Signed)
Patient ID: Tara Brown, female   DOB: Jul 03, 1961, 55 y.o.   MRN: [id number]  [facility] MD Progress Note  05/13/2016 11:42 AM Tara Brown  MRN:  [id number]   Subjective: Patient seen this morning and chart reviewed. Reports sleeping better last night. Continues to have shoulder pain.. Complains of anxiety. Patient still waiting for a call back from Assisted Living Facilities.SW is looking at [facility] application and getting care coordination arranged through [facility]  Denies any suicidal thoughts. Objective:  Motivated in continuing process to go to an ALF  No  disruptive or agitated behavior on unit Denies medication side effects    Principal Problem: Major depressive disorder, recurrent severe without psychotic features (HCC) Diagnosis:   Patient Active Problem List   Diagnosis Date Noted  . Back pain [M54.9] 04/25/2016  . Major depressive disorder, recurrent severe without psychotic features (HCC) [F33.2] 04/24/2016  . Left foot infection [L08.9] 12/06/2015  . Suicidal ideations [R45.851] 12/06/2015  . Rheumatoid arthritis (HCC) [M06.9] 12/06/2015  . Foot infection [L08.9] 12/05/2015  . Cellulitis of toe, left [L03.032]   . Chronic osteomyelitis (HCC) [M86.60] 09/07/2015  . Anemia, iron deficiency [D50.9]   . Essential hypertension [I10]   . Anemia of chronic disease [D63.8] 03/08/2015   Total Time spent with patient: 20 minutes   Past Psychiatric History: Please see H&P.   Past Medical History:  Past Medical History:  Diagnosis Date  . Arthritis   . Major depressive disorder, recurrent, severe without psychotic features (HCC) 03/11/2015    Past Surgical History:  Procedure Laterality Date  . AMPUTATION Left 12/07/2015   Procedure: AMPUTATION LEFT GREAT TOE;  Surgeon: Nadara Mustard, MD;  Location: [facility];  Service: Orthopedics;  Laterality: Left;  . FOOT SURGERY    . HAND SURGERY    . HIP FRACTURE SURGERY    . KNEE SURGERY    . NECK SURGERY     Family History:   Family History  Problem Relation Age of Onset  . Bipolar disorder Sister   . Suicidality Other   . Schizophrenia Other   . Stroke Neg Hx    Family Psychiatric  History:Please see H&P.  Social History:  History  Alcohol Use  . Yes    Comment: Rarely     History  Drug Use No    Social History   Social History  . Marital status: Divorced    Spouse name: N/A  . Number of children: N/A  . Years of education: N/A   Social History Main Topics  . Smoking status: Never Smoker  . Smokeless tobacco: Never Used  . Alcohol use Yes     Comment: Rarely  . Drug use: No  . Sexual activity: No   Other Topics Concern  . None   Social History Narrative  . None   Additional Social History:   Sleep: improved   Appetite: fair   Current Medications: Current Facility-Administered Medications  Medication Dose Route Frequency Provider Last Rate Last Dose  . alum & mag hydroxide-simeth (MAALOX/MYLANTA) 200-200-20 MG/5ML suspension 30 mL  30 mL Oral Q4H PRN Charm Rings, NP      . calcium-vitamin D (OSCAL WITH D) 500-200 MG-UNIT per tablet 1 tablet  1 tablet Oral BID Charm Rings, NP   1 tablet at 05/13/16 [phone]  . docusate sodium (COLACE) capsule 100 mg  100 mg Oral BID Adonis Brook, NP   100 mg at 05/13/16 0839  . DULoxetine (CYMBALTA) DR capsule 60 mg  60  mg Oral Daily Molinda BailiffUreh N Lekw[place]wa, MD   60 mg at 05/13/16 0839  . feeding supplement (ENSURE ENLIVE) (ENSURE ENLIVE) liquid 237 mL  237 mL Oral BID BM Rockey SituFernando A Cobos, MD   237 mL at 05/13/16 1000  . ferrous sulfate tablet 325 mg  325 mg Oral BID WC Jomarie LongsSaramma Eappen, MD   325 mg at 05/13/16 0839  . folic acid (FOLVITE) tablet 1 mg  1 mg Oral Daily Charm RingsJamison Y Lord, NP   1 mg at 05/13/16 [id number]  . hydrALAZINE (APRESOLINE) tablet 10 mg  10 mg Oral Q8H PRN Molinda BailiffUreh N [place], MD      . hydrOXYzine (ATARAX/VISTARIL) tablet 25 mg  25 mg Oral BID PRN Molinda BailiffUreh N [place], MD   25 mg at 05/12/16 2148  . lidocaine (LIDODERM) 5 % 2 patch  2 patch  Transdermal Q24H Jomarie LongsSaramma Eappen, MD   2 patch at 05/12/16 1224  . magnesium hydroxide (MILK OF MAGNESIA) suspension 30 mL  30 mL Oral Daily PRN Charm RingsJamison Y Lord, NP      . naproxen (NAPROSYN) tablet 375 mg  375 mg Oral BID PRN Jomarie LongsSaramma Eappen, MD   375 mg at 05/13/16 0838  . predniSONE (DELTASONE) tablet 10 mg  10 mg Oral Daily Charm RingsJamison Y Lord, NP   10 mg at 05/13/16 0839  . traMADol (ULTRAM) tablet 100 mg  100 mg Oral Q6H Craige CottaFernando A Cobos, MD   100 mg at 05/13/16 0608  . traZODone (DESYREL) tablet 100 mg  100 mg Oral QHS PRN Craige CottaFernando A Cobos, MD   100 mg at 05/12/16 2145    Lab Results:  No results found for this or any previous visit (from the past 48 hour(s)).  Blood Alcohol level:  Lab Results  Component Value Date   ETH <5 04/23/2016   ETH <5 08/31/2015    Metabolic Disorder Labs: No results found for: HGBA1C, MPG No results found for: PROLACTIN No results found for: CHOL, TRIG, HDL, CHOLHDL, VLDL, LDLCALC  Physical Findings: AIMS: Facial and Oral Movements Muscles of Facial Expression: None, normal Lips and Perioral Area: None, normal Jaw: None, normal Tongue: None, normal,Extremity Movements Upper (arms, wrists, hands, fingers): None, normal Lower (legs, knees, ankles, toes): None, normal, Trunk Movements Neck, shoulders, hips: None, normal, Overall Severity Severity of abnormal movements (highest score from questions above): None, normal Incapacitation due to abnormal movements: None, normal Patient's awareness of abnormal movements (rate only patient's report): No Awareness, Dental Status Current problems with teeth and/or dentures?: No Does patient usually wear dentures?: No  CIWA:    COWS:     Musculoskeletal: Strength & Muscle Tone: normal  Gait & Station: walks slowly due to pain and history of toe amputation  Patient leans: N/A  Psychiatric Specialty Exam: Physical Exam  Nursing note and vitals reviewed.   Review of Systems  Musculoskeletal: Positive for  back pain, joint pain and neck pain.  Psychiatric/Behavioral: Positive for depression. The patient is nervous/anxious.   All other systems reviewed and are negative. chronic pain and discomfort related to RA. Denies vomiting, no fever, no chills, no rash   Blood pressure 124/76, pulse 85, temperature 97.8 F (36.6 C), resp. rate 16, height 5\' 4"  (1.626 m), weight 102 lb (46.3 kg), SpO2 100 %.Body mass index is 17.51 kg/m.  General Appearance: improved grooming   Eye Contact:  Improved   Speech:  Normal Rate  Volume:  Normal  Mood:  Mood partially improved   Affect:  Constricted  Thought  Process:  Linear   Orientation:  Full (Time, Place, and Person)  Thought Content:  Linear   Suicidal Thoughts:  No at this time denies suicidal ideations, denies any self injurious ideations , contracts for safety on the unit  Homicidal Thoughts:  No denies homicidal or violent  ideations   Memory:  Recent and remote grossly intact    Judgement:  Improving   Insight:   Improving   Psychomotor Activity:  Normal  Concentration:  Concentration: Good and Attention Span: Good  Recall:  Good  Fund of Knowledge:  Good  Language:  Good  Akathisia:  No  Handed:  Right  AIMS (if indicated):     Assets:  Desire for Improvement  ADL's:  Needs help  Cognition:  WNL  Sleep:  Number of Hours: 5.75   Assessment - still depressed, constricted in affect, but has improved partially compared to admission and is more future oriented, denies any suicidal ideations, tolerating medications well at this time, reassured because found out her pet cat is back home, safe . Reports some increased R shoulder pain today .   Treatment Plan Summary: Encourage ongoing group and milieu participation to work on coping skills and symptom reduction   Continue vistaril 25mg  bid prn for anxiety Continue Cymbalta 60 mgrs QDAY for depression , anxiety  Continue  Trazodone 100 mg  QHS   PRN for sleep as needed   Increase Tramadol to Q  6 hours for pain management  Continue Lidoderm patch for pain management  Continue Prednisone 10 mgrs QDAY for management of Autoimmune Disorder  Treatment team working on disposition planning- see above   , MD 05/13/2016, 11:42 AM

## 2016-05-14 NOTE — Progress Notes (Signed)
D: Pt at the time of assessment endorsed severe anxiety with moderate depression; states, "I got the call I was expecting today and they said they don't accept pets; I know they have not accepted me either but I will be more depressed if they accepted" Pt also endorsed moderate generalized pain. Pt denies SI, HI or AVH. Pt had minimal interaction, remained calm and cooperative.  A: Medications offered as prescribed.  Support, encouragement, and safe environment provided.  15-minute safety checks continue. R: Pt was med compliant.  Pt attended wrap-up group. Safety checks continue.

## 2016-05-14 NOTE — [facility] Group Notes (Signed)
[facility] LCSW Group Therapy  05/14/2016 10:10 - 11 AM  Type of Therapy:  Group Therapy  Participation Level:  Active  Participation Quality:  Appropriate  Affect:  Appropriate  Cognitive:  Appropriate  Insight:  Improving  Engagement in Therapy:  Engaged  Modes of Intervention:  Activity, Discussion, Exploration, Rapport Building, Socialization and Support  Summary of Progress/Problems:Pt engaged easily during group session. As patients processed their anxiety about discharge and described healthy supports patient shared need for available supports. Patient reports she is not going to assissted living and is thrilled that she is not. Patient relies mostly on television for company and was open to considering spending some time outside and sometime without television on. Patient was encouraging to others in group setting.    Carney Bern, LCSW

## 2016-05-14 NOTE — [facility] Group Notes (Signed)
[facility] Group Notes:  (Nursing/MHT/Case Management/Adjunct)  Date:  05/14/2016  Time:  2:52 PM  Type of Therapy:  Life skills   Participation Level:  Active  Participation Quality:  Appropriate  Affect:  Flat  Cognitive:  Appropriate  Insight:  Appropriate  Engagement in Group:  Engaged  Modes of Intervention:  Discussion and Education  Summary of Progress/Problems: The purpose of this group was to discuss life skills in relation to support systems.

## 2016-05-14 NOTE — [facility] Group Notes (Signed)
[facility] Group Notes:  (Nursing/MHT/Case Management/Adjunct)  Date:  05/14/2016  Time:  1:01 PM  Type of Therapy:  Psychoeducational Skills  Participation Level:  Minimal  Participation Quality:  Appropriate  Affect:  Flat  Cognitive:  Appropriate  Insight:  Appropriate  Engagement in Group:  Engaged  Modes of Intervention:  Discussion and Education  Summary of Progress/Problems: Patient attended group and reported her supports were her cats and nieces.   Kirstie Larsen E 05/14/2016, 1:01 PM

## 2016-05-14 NOTE — Progress Notes (Signed)
Tara Brown remains fixated on what she WONT have when she is discharged ( as opposed to the positives shell get from living somewhere where people can check on her consistently. A She is bitter. Flat.depressed and stuck in her anger. She is not willing to discuss the positives today. AShe did complete her daily assessment and on it she wrote she deneid SI today and she rated her derpession, hopelessness and anxeity " 5/5/5", respectively. R Safety in place and staff to cont to support pt at this stage in her discharge [planning.

## 2016-05-14 NOTE — Progress Notes (Signed)
Patient ID: Tara Brown, female   DOB: [birth year], 55 y.o.   MRN: [id number]  [facility] MD Progress Note  05/14/2016 10:09 AM Tara Brown  MRN:  [id number]   Subjective: Patient seen this morning and chart reviewed. Reports fair sleep and ok appetite, states the breakfast food was unappetizing. She is dressed in her own clothes. Reports mood is almost back to normal.sleeping better last night.  Patient still waiting for a call back from [facility]isted Living Fac[facility].SW is looking at [facility] application and getting care coordination arranged through [facility]  Denies any suicidal thoughts. Objective:  Motivated in continuing process to go to an ALF  No  disruptive or agitated behavior on unit Denies medication side effects    Principal Problem: Major depressive disorder, recurrent severe without psychotic features (HCC) Diagnosis:   Patient Active Problem List   Diagnosis Date Noted  . Back pain [M54.9] 04/25/2016  . Major depressive disorder, recurrent severe without psychotic features (HCC) [F33.2] 04/24/2016  . Left foot infection [L08.9] 12/06/2015  . Suicidal ideations [R45.851] 12/06/2015  . Rheumatoid arthritis (HCC) [M06.9] 12/06/2015  . Foot infection [L08.9] 12/05/2015  . Cellulitis of toe, left [L03.032]   . Chronic osteomyelitis (HCC) [M86.60] 09/07/2015  . Anemia, iron deficiency [D50.9]   . Essential hypertension [I10]   . Anemia of chronic disease [D63.8] 03/08/2015   Total Time spent with patient: 20 minutes   Past Psychiatric History: Please see H&P.   Past Medical History:  Past Medical History:  Diagnosis Date  . Arthritis   . Major depressive disorder, recurrent, severe without psychotic features (HCC) 03/11/2015    Past Surgical History:  Procedure Laterality Date  . AMPUTATION Left 12/07/2015   Procedure: AMPUTATION LEFT GREAT TOE;  Surgeon: Nadara Mustard, MD;  Location: [facility];  Service: Orthopedics;  Laterality: Left;  . FOOT SURGERY    . HAND SURGERY     . HIP FRACTURE SURGERY    . KNEE SURGERY    . NECK SURGERY     Family History:  Family History  Problem Relation Age of Onset  . Bipolar disorder Sister   . Suicidality Other   . Schizophrenia Other   . Stroke Neg Hx    Family Psychiatric  History:Please see H&P.  Social History:  History  Alcohol Use  . Yes    Comment: Rarely     History  Drug Use No    Social History   Social History  . Marital status: Divorced    Spouse name: N/A  . Number of children: N/A  . Years of education: N/A   Social History Main Topics  . Smoking status: Never Smoker  . Smokeless tobacco: Never Used  . Alcohol use Yes     Comment: Rarely  . Drug use: No  . Sexual activity: No   Other Topics Concern  . None   Social History Narrative  . None   Additional Social History:   Sleep: improved   Appetite: fair   Current Medications: Current Facility-Administered Medications  Medication Dose Route Frequency Provider Last Rate Last Dose  . alum & mag hydroxide-simeth (MAALOX/MYLANTA) 200-200-20 MG/5ML suspension 30 mL  30 mL Oral Q4H PRN Charm Rings, NP      . calcium-vitamin D (OSCAL WITH D) 500-200 MG-UNIT per tablet 1 tablet  1 tablet Oral BID Charm Rings, NP   1 tablet at 05/14/16 [phone]  . docusate sodium (COLACE) capsule 100 mg  100 mg Oral BID Adonis Brook, NP  100 mg at 05/14/16 0821  . DULoxetine (CYMBALTA) DR capsule 60 mg  60 mg Oral Daily Molinda Bailiff, MD   60 mg at 05/14/16 0821  . feeding supplement (ENSURE ENLIVE) (ENSURE ENLIVE) liquid 237 mL  237 mL Oral BID BM Rockey Situ Cobos, MD   237 mL at 05/14/16 1000  . ferrous sulfate tablet 325 mg  325 mg Oral BID WC Jomarie Longs, MD   325 mg at 05/14/16 0821  . folic acid (FOLVITE) tablet 1 mg  1 mg Oral Daily Charm Rings, NP   1 mg at 05/14/16 9379  . hydrALAZINE (APRESOLINE) tablet 10 mg  10 mg Oral Q8H PRN Molinda Bailiff, MD      . hydrOXYzine (ATARAX/VISTARIL) tablet 25 mg  25 mg Oral BID PRN Molinda Bailiff, MD   25 mg at 05/13/16 2127  . lidocaine (LIDODERM) 5 % 2 patch  2 patch Transdermal Q24H Jomarie Longs, MD   2 patch at 05/13/16 1718  . magnesium hydroxide (MILK OF MAGNESIA) suspension 30 mL  30 mL Oral Daily PRN Charm Rings, NP      . naproxen (NAPROSYN) tablet 375 mg  375 mg Oral BID PRN Jomarie Longs, MD   375 mg at 05/13/16 2127  . predniSONE (DELTASONE) tablet 10 mg  10 mg Oral Daily Charm Rings, NP   10 mg at 05/14/16 0240  . traMADol (ULTRAM) tablet 100 mg  100 mg Oral Q6H Craige Cotta, MD   100 mg at 05/14/16 0635  . traZODone (DESYREL) tablet 100 mg  100 mg Oral QHS PRN Craige Cotta, MD   100 mg at 05/13/16 2127    Lab Results:  No results found for this or any previous visit (from the past 48 hour(s)).  Blood Alcohol level:  Lab Results  Component Value Date   ETH <5 04/23/2016   ETH <5 08/31/2015    Metabolic Disorder Labs: No results found for: HGBA1C, MPG No results found for: PROLACTIN No results found for: CHOL, TRIG, HDL, CHOLHDL, VLDL, LDLCALC  Physical Findings: AIMS: Facial and Oral Movements Muscles of Facial Expression: None, normal Lips and Perioral Area: None, normal Jaw: None, normal Tongue: None, normal,Extremity Movements Upper (arms, wrists, hands, fingers): None, normal Lower (legs, knees, ankles, toes): None, normal, Trunk Movements Neck, shoulders, hips: None, normal, Overall Severity Severity of abnormal movements (highest score from questions above): None, normal Incapacitation due to abnormal movements: None, normal Patient's awareness of abnormal movements (rate only patient's report): No Awareness, Dental Status Current problems with teeth and/or dentures?: No Does patient usually wear dentures?: No  CIWA:    COWS:     Musculoskeletal: Strength & Muscle Tone: normal  Gait & Station: walks slowly due to pain and history of toe amputation  Patient leans: N/A  Psychiatric Specialty Exam: Physical Exam   Nursing note and vitals reviewed.   Review of Systems  Musculoskeletal: Positive for back pain, joint pain and neck pain.  Psychiatric/Behavioral: Positive for depression. The patient is nervous/anxious.   All other systems reviewed and are negative. chronic pain and discomfort related to RA. Denies vomiting, no fever, no chills, no rash   Blood pressure 131/77, pulse 77, temperature 97.7 F (36.5 C), resp. rate 18, height 5\' 4"  (1.626 m), weight 102 lb (46.3 kg), SpO2 100 %.Body mass index is 17.51 kg/m.  General Appearance: improved grooming   Eye Contact:  Improved   Speech:  Normal Rate  Volume:  Normal  Mood:  Mood partially improved   Affect: pleasant  Thought Process:  Linear   Orientation:  Full (Time, Place, and Person)  Thought Content:  Linear   Suicidal Thoughts:  No at this time denies suicidal ideations, denies any self injurious ideations , contracts for safety on the unit  Homicidal Thoughts:  No denies homicidal or violent  ideations   Memory:  Recent and remote grossly intact    Judgement:  Improving   Insight:   Improving   Psychomotor Activity:  Normal  Concentration:  Concentration: Good and Attention Span: Good  Recall:  Good  Fund of Knowledge:  Good  Language:  Good  Akathisia:  No  Handed:  Right  AIMS (if indicated):     Assets:  Desire for Improvement  ADL's:  Needs help  Cognition:  WNL  Sleep:  Number of Hours: 5.25    Treatment Plan Summary: No change in plan. 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

## 2016-05-15 MED ORDER — HYDROXYZINE HCL 25 MG PO TABS: 25.0000 mg | ORAL_TABLET | Freq: Two times a day (BID) | ORAL | 0 refills | Status: DC | PRN

## 2016-05-15 MED ORDER — TRAZODONE HCL 100 MG PO TABS: 100.0000 mg | ORAL_TABLET | Freq: Every evening | ORAL | 0 refills | Status: DC | PRN

## 2016-05-15 MED ORDER — DULOXETINE HCL 60 MG PO CPEP: 60.0000 mg | ORAL_CAPSULE | Freq: Every day | ORAL | 0 refills | Status: DC

## 2016-05-15 MED ORDER — FERROUS SULFATE 325 (65 FE) MG PO TABS: 325.0000 mg | ORAL_TABLET | Freq: Two times a day (BID) | ORAL | 0 refills | Status: DC

## 2016-05-15 NOTE — Progress Notes (Signed)
Recreation Therapy Notes  Date: 05/15/16 Time: 0930 Location: [address] Group Room  Group Topic: Stress Management  Goal Area(s) Addresses:  Patient will verbalize importance of using healthy stress management.  Patient will identify positive emotions associated with healthy stress management.   Intervention: Stress Management  Activity :  Guided Imagery.  LRT introduced the stress management technique of guided imagery to the patients.  LRT read a script so patients could participate in the activity.  Patients were to follow along as LRT read script to participate.  Education:  Stress Management, Discharge Planning.   Education Outcome: Needs additional education  Clinical Observations/Feedback: Pt did not attend group.   Caroll Rancher, LRT/CTRS         Caroll Rancher A 05/15/2016 12:31 PM

## 2016-05-15 NOTE — Progress Notes (Addendum)
  [facility] Adult Case Management Discharge Plan :  Will you be returning to the same living situation after discharge:  Yes,  patient plans to return home At discharge, do you have transportation home?: Yes,  With taxi voucher Do you have the ability to pay for your medications: Yes,  patient will be provided with prescriptions at discharge  Release of information consent forms completed and in the chart;  Patient's signature needed at discharge.  Patient to Follow up at: Follow-up Information    [facility] Follow up on 05/17/2016.   Why:  Hospital discharge follow up appointment on 05/17/16 at 10 AM w Deneen Harts.  Pt referred to PCP only at patient request.  Please call to cancel/reschedule if needed.  Bring hospital discharge paperwork to this appointment Contact information: [address], [place], [place] [address] Phone: [phone] Fax:  [phone]       [facility] .   Why:  Please connect with your case management, Hanley Ben, for assistance w housing needs.   Contact information: Phone:  [phone] NO RECORDS SHOULD BE SENT - information placed in AVS so patient will have case manager's contact information for reference          Next level of care provider has access to [facility] Health Link:no  Safety Planning and Suicide Prevention discussed: Yes,  with patient and family  Have you used any form of tobacco in the last 30 days? (Cigarettes, Smokeless Tobacco, Cigars, and/or Pipes): No  Has patient been referred to the Quitline?: N/A patient is not a smoker  Patient has been referred for addiction treatment: Yes  Aneli Zara L Shandell Jallow 05/15/2016, 2:11 PM

## 2016-05-15 NOTE — Progress Notes (Signed)
Data. Patient denies SI/HI/AVH. Patient interacting well with staff and other patients. Patient wants to go home today, but is concerned that she may not have transportation.  Action. Emotional support and encouragement offered. Education provided on medication, indications and side effect. Q 15 minute checks done for safety. Response. Safety on the unit maintained through 15 minute checks. Medications taken as prescribed. Attended groups. Remained calm and appropriate through out shift.  Pt. discharged to lobby.  Belongings sheet reviewed and signed by pt. and all belongings, including scripts and medication samples, sent home. Paperwork reviewed and pt. able to verbalize understanding of education. Pt. in no current distress and ambulatory. Sent with [employer], via voucher.

## 2016-05-15 NOTE — Tx Team (Signed)
Interdisciplinary Treatment Plan Update (Adult) Date: 05/15/2016   Date: 05/15/2016 9:30am  Progress in Treatment:  Attending groups: Yes Participating in groups: Minimally Taking medication as prescribed: Yes  Tolerating medication: Yes  Family/Significant othe contact made: Yes with sister and father Patient understands diagnosis: Yes AEB seeking help with depression Discussing patient identified problems/goals with staff: Yes  Medical problems stabilized or resolved: Yes  Denies suicidal/homicidal ideation: Yes Patient has not harmed self or Others: Yes   New problem(s) identified: None identified at this time.   Discharge Plan or Barriers: CSW will assess for appropriate discharge plan and relevant barriers.   04/28/16: Pt will return home and follow-up with outpatient services.   05/03/16: ALF placement started; PASRR pending; FL2 created and CSW will begin seeking placement in [facility] ALF.  05/08/16: CSW continues to seek placement in ALF facility; PASRR received and FL2 sent to various facilities.   05/11/16: ALF placement search continues  05/15/16: Goal met. Patient plans to return home to follow up with outpatient services.  Additional comments:  Patient and CSW reviewed pt's identified goals and treatment plan. Patient verbalized understanding and agreed to treatment plan.   Reason for Continuation of Hospitalization:  Depression Medication stabilization Suicidal ideation  Estimated length of stay: Discharge anticipated for today 05/15/16  Review of initial/current patient goals per problem list:   1.  Goal(s): Patient will participate in aftercare plan  Met:  Yes  Target date: 3-5 days from date of admission   As evidenced by: Patient will participate within aftercare plan AEB aftercare provider and housing plan at discharge being identified.  04/25/16: CSW to work with Pt to assess for appropriate discharge plan and faciliate appointments and referrals as  needed prior to d/c. 04/28/16: Pt will return home and follow-up with outpatient services.  05/03/16: ALF placement started; PASRR pending; FL2 created and CSW will begin seeking placement in [facility] ALF. 05/08/16: CSW continues to seek placement in ALF facility; PASRR received and FL2 sent to various facilities.  8/31: CSW continues to seek placement; difficult to find facility willing to take Medicaid payor 05/15/16: Goal met. Patient plans to return home to follow up with outpatient services.   2.  Goal (s): Patient will exhibit decreased depressive symptoms and suicidal ideations.  Met:  Adequate for discharge per MD  Target date: 3-5 days from date of admission   As evidenced by: Patient will utilize self rating of depression at 3 or below and demonstrate decreased signs of depression or be deemed stable for discharge by MD. 04/25/16: Pt was admitted with symptoms of depression, rating 10/10. Pt continues to present with flat affect and depressive symptoms.  Pt will demonstrate decreased symptoms of depression and rate depression at 3/10 or lower prior to discharge. 04/28/16: Pt rates depression at 5/10; denies SI 05/03/16: Pt rates depression at 5/10; denies SI 05/08/16: Pt rates depression at 4/10; denies SI 05/11/16: Pt rates depression at 6/10; denies SI 9/4: Adequate for discharge per MD. Patient reports improvements in her symptoms since admission and denies SI. She reports feeling safe to return home at this time.   Attendees:  Patient:  Physician: Dr. Shea Evans, MD 05/15/2016 9:30am  Nursing: Mayra Neer, Marilynne Halsted, Christa Guadlupe Spanish Carter9/12/2015 9:30am  RN Care Manager:   Social Workers: Tilden Fossa, [place], [place] LCSW 05/15/2016 9:30am  Tilden Fossa, [place] Worker [facility] [phone]

## 2016-05-15 NOTE — Progress Notes (Signed)
Pt has remained in her room all evening.  She continues to deny SI/HI/AVH.  She says that she will probably discharge on Monday, but she intends to return to her home against the wishes of her family as she has not found an ALF that will accept her cat.  Conversation is minimal with pt and pt presents as flat and sullen.  Pt makes her needs known to staff and continues to receive scheduled pain meds for her arthritic pain.  Support and encouragement offered.  Discharge plans are in process.  Safety maintained with q15 minute checks.

## 2016-05-15 NOTE — Discharge Summary (Signed)
Physician Discharge Summary Note  Patient:  Tara Brown is an 55 y.o., female MRN:  [id number] DOB:  Sep 02, 1961 Patient phone:  [phone] (home)  Patient address:   [address] [place] [address],  Total Time spent with patient: 30 minutes  Date of Admission:  04/24/2016 Date of Discharge: 05/15/2016  Reason for Admission:PER HPI-Tara Brown a 55 y.o.femalewho presented to [facility] voluntarily after attempting to commit suicide by intentionally overdosing on 30 10 mg Ambien tabs. Pt reported as follows: Pt stated that she has significant and chronic arthritis pain that makes her feel helpless and despondent. She stated that the pain was severe enough on 04/21/16 that she decided to kill herself by overdosing on the Ambien. "I didn't want to be a burden to my family anymore." Pt stated that she took the pills and was frustrated when she awoke on 04/22/16. It was at that point that she decided she should come to the hospital. In addition to her suicide attempt (her first), Pt endorsed persistent and unremitting sadness, poor appetite, difficulty sleeping without medicinal aid, tearfulness, feelings of worthlessness and hopelessness, isolation, and loss of pleasure. Pt takes Zoloft, which is prescribed by her PCP. She said that she does not have the funds to see a psychiatrist or engage in therapy."  Principal Problem: Major depressive disorder, recurrent severe without psychotic features [facility]) Discharge Diagnoses: Patient Active Problem List   Diagnosis Date Noted  . Back pain [M54.9] 04/25/2016  . Major depressive disorder, recurrent severe without psychotic features (HCC) [F33.2] 04/24/2016  . Left foot infection [L08.9] 12/06/2015  . Suicidal ideations [R45.851] 12/06/2015  . Rheumatoid arthritis (HCC) [M06.9] 12/06/2015  . Foot infection [L08.9] 12/05/2015  . Cellulitis of toe, left [L03.032]   . Chronic osteomyelitis (HCC) [M86.60] 09/07/2015  .  Anemia, iron deficiency [D50.9]   . Essential hypertension [I10]   . Anemia of chronic disease [D63.8] 03/08/2015    Past Psychiatric History: See Above  Past Medical History:  Past Medical History:  Diagnosis Date  . Arthritis   . Major depressive disorder, recurrent, severe without psychotic features (HCC) 03/11/2015    Past Surgical History:  Procedure Laterality Date  . AMPUTATION Left 12/07/2015   Procedure: AMPUTATION LEFT GREAT TOE;  Surgeon: Nadara Mustard, MD;  Location: [facility];  Service: Orthopedics;  Laterality: Left;  . FOOT SURGERY    . HAND SURGERY    . HIP FRACTURE SURGERY    . KNEE SURGERY    . NECK SURGERY     Family History:  Family History  Problem Relation Age of Onset  . Bipolar disorder Sister   . Suicidality Other   . Schizophrenia Other   . Stroke Neg Hx    Family Psychiatric  History: See HPI Social History:  History  Alcohol Use  . Yes    Comment: Rarely     History  Drug Use No    Social History   Social History  . Marital status: Divorced    Spouse name: N/A  . Number of children: N/A  . Years of education: N/A   Social History Main Topics  . Smoking status: Never Smoker  . Smokeless tobacco: Never Used  . Alcohol use Yes     Comment: Rarely  . Drug use: No  . Sexual activity: No   Other Topics Concern  . None   Social History Narrative  . None    Hospital Course:  Tara Brown was admitted for Major  depressive disorder, recurrent severe without psychotic features (HCC)  and crisis management.  Pt was treated discharged with the medications listed below under Medication List.  Medical problems were identified and treated as needed.  Home medications were restarted as appropriate.  Improvement was monitored by observation and Tara Brown 's daily report of symptom reduction.  Emotional and mental status was monitored by daily self-inventory reports completed by Tara Brown and clinical staff.         Tara  Brown was evaluated by the treatment team for stability and plans for continued recovery upon discharge. Tara Brown 's motivation was an integral factor for scheduling further treatment. Employment, transportation, bed availability, health status, family support, and any pending legal issues were also considered during hospital stay. Pt was offered further treatment options upon discharge including but not limited to Residential, Intensive Outpatient, and Outpatient treatment.  [name] will follow up with the services as listed below under Follow Up Information.     Upon completion of this admission the patient was both mentally and medically stable for discharge denying suicidal/homicidal ideation, auditory/visual/tactile hallucinations, delusional thoughts and paranoia. Patient was pending placement for assisted living facility. Patient is requesting to be discharged home to her primary residents. States she is willing to follow-up with home health care for (ADL's)  Tara Brown responded well to treatment with Cymbalta 60 mg and  trazodone 100 mg  without adverse effects. Pt demonstrated improvement without reported or observed adverse effects to the point of stability appropriate for outpatient management. Pertinent labs include:BMP, Iron and TIBC  for which outpatient follow-up is necessary for lab recheck as mentioned below. Reviewed CBC, CMP, BAL, and UDS; all unremarkable aside from noted exceptions.   Physical Findings: AIMS: Facial and Oral Movements Muscles of Facial Expression: None, normal Lips and Perioral Area: None, normal Jaw: None, normal Tongue: None, normal,Extremity Movements Upper (arms, wrists, hands, fingers): None, normal Lower (legs, knees, ankles, toes): None, normal, Trunk Movements Neck, shoulders, hips: None, normal, Overall Severity Severity of abnormal movements (highest score from questions above): None, normal Incapacitation due to abnormal  movements: None, normal Patient's awareness of abnormal movements (rate only patient's report): No Awareness, Dental Status Current problems with teeth and/or dentures?: No Does patient usually wear dentures?: No  CIWA:    COWS:     Musculoskeletal: Strength & Muscle Tone: within normal limits Gait & Station: normal Patient leans: N/A and .  Psychiatric Specialty Exam: Physical Exam  Nursing note and vitals reviewed. Constitutional: She is oriented to person, place, and time. She appears well-developed.  HENT:  Head: Normocephalic.  Musculoskeletal: Normal range of motion.  Neurological: She is alert and oriented to person, place, and time.  Psychiatric: She has a normal mood and affect. Her behavior is normal.    Review of Systems  Psychiatric/Behavioral: Negative for hallucinations and suicidal ideas. Depression: stable. Nervous/anxious: stable.     Blood pressure 112/73, pulse 79, temperature 98.3 F (36.8 C), temperature source Oral, resp. rate 16, height 5\' 4"  (1.626 m), weight 46.3 kg (102 lb), SpO2 100 %.Body mass index is 17.51 kg/m.    Have you used any form of tobacco in the last 30 days? (Cigarettes, Smokeless Tobacco, Cigars, and/or Pipes): No  Has this patient used any form of tobacco in the last 30 days? (Cigarettes, Smokeless Tobacco, Cigars, and/or Pipes), No  Blood Alcohol level:  Lab Results  Component Value Date   ETH <5 04/23/2016   ETH <5 08/31/2015  Metabolic Disorder Labs:  No results found for: HGBA1C, MPG No results found for: PROLACTIN No results found for: CHOL, TRIG, HDL, CHOLHDL, VLDL, LDLCALC  See Psychiatric Specialty Exam and Suicide Risk Assessment completed by Attending Physician prior to discharge.  Discharge destination:  Home  Is patient on multiple antipsychotic therapies at discharge:  No   Has Patient had three or more failed trials of antipsychotic monotherapy by history:  No  Recommended Plan for Multiple Antipsychotic  Therapies: NA  Discharge Instructions    Diet - low sodium heart healthy    Complete by:  As directed   Discharge instructions    Complete by:  As directed   Take all medications as prescribed. Keep all follow-up appointments as scheduled.  Do not consume alcohol or use illegal drugs while on prescription medications. Report any adverse effects from your medications to your primary care provider promptly.  In the event of recurrent symptoms or worsening symptoms, call 911, a crisis hotline, or go to the nearest emergency department for evaluation.   Increase activity slowly    Complete by:  As directed       Medication List    STOP taking these medications   predniSONE 10 MG tablet Commonly known as:  DELTASONE   sertraline 100 MG tablet Commonly known as:  ZOLOFT   traMADol 50 MG tablet Commonly known as:  ULTRAM   zolpidem 10 MG tablet Commonly known as:  AMBIEN     TAKE these medications     Indication  calcium-vitamin D 500-200 MG-UNIT tablet Commonly known as:  OSCAL WITH D Take 1 tablet by mouth 2 (two) times daily.  Indication:  Low Amount of Calcium in the Blood   DULoxetine 60 MG capsule Commonly known as:  CYMBALTA Take 1 capsule (60 mg total) by mouth daily.  Indication:  Major Depressive Disorder   ferrous sulfate 325 (65 FE) MG tablet Take 1 tablet (325 mg total) by mouth 2 (two) times daily with a meal.  Indication:  Iron Deficiency   fluocinonide cream 0.05 % Commonly known as:  LIDEX Apply 1 application topically 2 (two) times daily as needed (for psorasis).  Indication:  Skin Disease Successfully Treated with Steroid Therapy   folic acid 1 MG tablet Commonly known as:  FOLVITE Take 1 mg by mouth daily.  Indication:  Anemia From Inadequate Folic Acid   HUMIRA PEN 40 KN/3.9JQ Pnkt Generic drug:  Adalimumab Inject 40 mg into the skin every 14 (fourteen) days.  Indication:  Rheumatoid Arthritis   hydrOXYzine 25 MG tablet Commonly known as:   ATARAX/VISTARIL Take 1 tablet (25 mg total) by mouth 2 (two) times daily as needed for anxiety.  Indication:  Anxiety associated with Organic Disease   lisinopril 40 MG tablet Commonly known as:  PRINIVIL,ZESTRIL Take 40 mg by mouth daily.  Indication:  High Blood Pressure Disorder   methotrexate 2.5 MG tablet Commonly known as:  RHEUMATREX Take 7.5 mg by mouth 2 (two) times a week. Thursday and Friday  Indication:  Rheumatoid Arthritis   traZODone 100 MG tablet Commonly known as:  DESYREL Take 1 tablet (100 mg total) by mouth at bedtime as needed for sleep. What changed:  medication strength  how much to take  when to take this  reasons to take this  Indication:  Trouble Sleeping      Follow-up Information    [facility] Follow up on 05/17/2016.   Why:  Hospital discharge follow up  appointment on 05/17/16 at 10 AM w Deneen Harts.  Pt referred to PCP only at patient request.  Please call to cancel/reschedule if needed.  Bring hospital discharge paperwork to this appointment Contact information: [address] [place], [place] [address] Phone: [phone] Fax:  [phone]       [facility] .   Why:  Please connect with your case management, Hanley Ben, for assistance w housing needs.   Contact information: Phone:  [phone] NO RECORDS SHOULD BE SENT - information placed in AVS so patient will have case manager's contact information for reference          Follow-up recommendations:  Activity:  as tolerated Diet:  heart healthy  Comments:  Take all medications as prescribed. Keep all follow-up appointments as scheduled.  Do not consume alcohol or use illegal drugs while on prescription medications. Report any adverse effects from your medications to your primary care provider promptly.  In the event of recurrent symptoms or worsening symptoms, call 911, a crisis hotline, or go to the nearest emergency department for  evaluation.   Signed: Oneta Rack, NP 05/15/2016, 3:04 PM

## 2016-05-15 NOTE — BHH Suicide Risk Assessment (Signed)
[facility] Discharge Suicide Risk Assessment   Principal Problem: Major depressive disorder, recurrent severe without psychotic features [facility]) Discharge Diagnoses:  Patient Active Problem List   Diagnosis Date Noted  . Back pain [M54.9] 04/25/2016  . Major depressive disorder, recurrent severe without psychotic features (HCC) [F33.2] 04/24/2016  . Left foot infection [L08.9] 12/06/2015  . Suicidal ideations [R45.851] 12/06/2015  . Rheumatoid arthritis (HCC) [M06.9] 12/06/2015  . Foot infection [L08.9] 12/05/2015  . Cellulitis of toe, left [L03.032]   . Chronic osteomyelitis (HCC) [M86.60] 09/07/2015  . Anemia, iron deficiency [D50.9]   . Essential hypertension [I10]   . Anemia of chronic disease [D63.8] 03/08/2015    Total Time spent with patient: 30 minutes  Musculoskeletal: Strength & Muscle Tone: within normal limits Gait & Station: normal Patient leans: N/A  Psychiatric Specialty Exam: Review of Systems  Psychiatric/Behavioral: Negative for depression and suicidal ideas.  All other systems reviewed and are negative.   Blood pressure 112/73, pulse 79, temperature 98.3 F (36.8 C), temperature source Oral, resp. rate 16, height 5\' 4"  (1.626 m), weight 46.3 kg (102 lb), SpO2 100 %.Body mass index is 17.51 kg/m.  General Appearance: Casual  Eye Contact::  Fair  Speech:  Clear and Coherent409  Volume:  Decreased  Mood:  Euthymic  Affect:  Congruent  Thought Process:  Goal Directed and Descriptions of Associations: Intact  Orientation:  Full (Time, Place, and Person)  Thought Content:  Logical  Suicidal Thoughts:  No  Homicidal Thoughts:  No  Memory:  Immediate;   Fair Recent;   Fair Remote;   Fair  Judgement:  Fair  Insight:  Fair  Psychomotor Activity:  Normal  Concentration:  Fair  Recall:  [id number] of Knowledge:Fair  Language: Fair  Akathisia:  No  Handed:  Right  AIMS (if indicated):     Assets:  Communication Skills Desire for Improvement  Sleep:  Number of  Hours: 4.75  Cognition: WNL  ADL's:  Intact   Mental Status Per Nursing Assessment::   On Admission:  Suicidal ideation indicated by patient, Suicide plan, Self-harm thoughts, Intention to act on suicide plan  Demographic Factors:  Caucasian  Loss Factors: Decline in physical health  Historical Factors: Impulsivity  Risk Reduction Factors:   Positive social support  Continued Clinical Symptoms:  Unstable or Poor Therapeutic Relationship Medical Diagnoses and Treatments/Surgeries  Cognitive Features That Contribute To Risk:  None    Suicide Risk:  Minimal: No identifiable suicidal ideation.  Patients presenting with no risk factors but with morbid ruminations; may be classified as minimal risk based on the severity of the depressive symptoms  Follow-up Information    [facility] Follow up on 05/17/2016.   Why:  Hospital discharge follow up appointment on 05/17/16 at 10 AM w 07/17/16.  Pt referred to PCP only at patient request.  Please call to cancel/reschedule if needed.  Bring hospital discharge paperwork to this appointment Contact information: [address], [address], [place], [place] Phone: [phone] Fax:  [phone]       [facility] .   Why:  Please connect with your case management, [facility], for assistance w housing needs.   Contact information: Phone:  [phone] NO RECORDS SHOULD BE SENT - information placed in AVS so patient will have case manager's contact information for reference          Plan Of Care/Follow-up recommendations:  Activity:  no restrictions Diet:  regular Tests:  as  needed Other:  Patient to make use of social support available and continue after care services.  Kamorah Nevils, MD 05/15/2016, 2:36 PM

## 2016-05-25 ENCOUNTER — Encounter (HOSPITAL_COMMUNITY): Payer: Self-pay | Admitting: Emergency Medicine

## 2016-05-25 ENCOUNTER — Emergency Department (HOSPITAL_COMMUNITY)
Admission: EM | Admit: 2016-05-25 | Discharge: 2016-05-28 | Disposition: A | Discharge status: Other Acute Inpatient Hospital | Payer: Medicare Other | Attending: Emergency Medicine | Admitting: Emergency Medicine

## 2016-05-25 DIAGNOSIS — G894 Chronic pain syndrome: Secondary | ICD-10-CM | POA: Diagnosis not present

## 2016-05-25 DIAGNOSIS — I10 Essential (primary) hypertension: Secondary | ICD-10-CM | POA: Insufficient documentation

## 2016-05-25 DIAGNOSIS — R45851 Suicidal ideations: Secondary | ICD-10-CM | POA: Diagnosis present

## 2016-05-25 DIAGNOSIS — Z79899 Other long term (current) drug therapy: Secondary | ICD-10-CM | POA: Insufficient documentation

## 2016-05-25 DIAGNOSIS — F332 Major depressive disorder, recurrent severe without psychotic features: Secondary | ICD-10-CM | POA: Diagnosis not present

## 2016-05-25 LAB — CBC WITH DIFFERENTIAL/PLATELET
Basophils Absolute: 0 10*3/uL (ref 0.0–0.1)
Basophils Relative: 0 %
Eosinophils Absolute: 0 10*3/uL (ref 0.0–0.7)
Eosinophils Relative: 0 %
HEMATOCRIT: 30.2 % — AB (ref 36.0–46.0)
Hemoglobin: 9.8 g/dL — ABNORMAL LOW (ref 12.0–15.0)
LYMPHS ABS: 1.8 10*3/uL (ref 0.7–4.0)
LYMPHS PCT: 28 %
MCH: 24 pg — AB (ref 26.0–34.0)
MCHC: 32.5 g/dL (ref 30.0–36.0)
MCV: 74 fL — AB (ref 78.0–100.0)
MONO ABS: 0.5 10*3/uL (ref 0.1–1.0)
MONOS PCT: 7 %
NEUTROS ABS: 4.1 10*3/uL (ref 1.7–7.7)
Neutrophils Relative %: 65 %
Platelets: 357 10*3/uL (ref 150–400)
RBC: 4.08 MIL/uL (ref 3.87–5.11)
RDW: 20.6 % — AB (ref 11.5–15.5)
WBC: 6.4 10*3/uL (ref 4.0–10.5)

## 2016-05-25 LAB — COMPREHENSIVE METABOLIC PANEL
ALBUMIN: 3.5 g/dL (ref 3.5–5.0)
ALT: 10 U/L — AB (ref 14–54)
AST: 16 U/L (ref 15–41)
Alkaline Phosphatase: 70 U/L (ref 38–126)
Anion gap: 11 (ref 5–15)
BUN: 12 mg/dL (ref 6–20)
CHLORIDE: 103 mmol/L (ref 101–111)
CO2: 23 mmol/L (ref 22–32)
Calcium: 9.2 mg/dL (ref 8.9–10.3)
Creatinine, Ser: 0.81 mg/dL (ref 0.44–1.00)
GFR calc Af Amer: >60 mL/min (ref >60)
GFR calc non Af Amer: >60 mL/min (ref >60)
GLUCOSE: 117 mg/dL — AB (ref 65–99)
POTASSIUM: 3.4 mmol/L — AB (ref 3.5–5.1)
Sodium: 137 mmol/L (ref 135–145)
Total Bilirubin: 0.6 mg/dL (ref 0.3–1.2)
Total Protein: 8.4 g/dL — ABNORMAL HIGH (ref 6.5–8.1)

## 2016-05-25 LAB — RAPID URINE DRUG SCREEN, HOSP PERFORMED
Amphetamines: NOT DETECTED
BARBITURATES: NOT DETECTED
BENZODIAZEPINES: NOT DETECTED
Cocaine: NOT DETECTED
Opiates: NOT DETECTED
Tetrahydrocannabinol: NOT DETECTED

## 2016-05-25 LAB — ETHANOL: Alcohol, Ethyl (B): <5 mg/dL (ref <5)

## 2016-05-25 LAB — SALICYLATE LEVEL

## 2016-05-25 LAB — ACETAMINOPHEN LEVEL: Acetaminophen (Tylenol), Serum: <10 ug/mL — ABNORMAL LOW (ref 10–30)

## 2016-05-25 MED ORDER — OXYCODONE-ACETAMINOPHEN 5-325 MG PO TABS
1.0000 | ORAL_TABLET | Freq: Once | ORAL | Status: AC
Start: 2016-05-25 — End: 2016-05-25
  Administered 2016-05-25: 1 via ORAL
  Filled 2016-05-25: qty 1

## 2016-05-25 NOTE — ED Provider Notes (Signed)
Care assumed from [place], [name] at end of shift. Tara Brown is an 55 y.o. female with history of severe rheumatoid arthritis who presents to the ED tonight as she is having difficulty caring for herself due to the extent of her disease. She physically is unable to care for herself, to get around. Case management and CSW have been consulted and orders for home health have been placed along with other community resources such as meals on wheels. CM is trying to find assisted living placement. However, pt verbalized that she is feeling extremely depressed due to her condition. She did admit to staff members she has had suicidal thoughts. Notably, Pt was recently discharged after a 23 stay at [facility] and also recently intentionally overdosed on sleeping medicine with the intent to kill herself. At this point disposition is pending TTS consultation.  Physical Exam  BP (!) 130/106 (BP Location: Left Arm)   Pulse 105   Temp 98.2 F (36.8 C) (Oral)   Resp 18   Ht 5\' 6"  (1.676 m)   Wt 47.6 kg   SpO2 97%   BMI 16.95 kg/m   Physical Exam  Constitutional: She is oriented to person, place, and time. No distress.  HENT:  Head: Atraumatic.  Right Ear: External ear normal.  Left Ear: External ear normal.  Nose: Nose normal.  Eyes: Conjunctivae are normal. No scleral icterus.  Cardiovascular: Normal rate.   Pulmonary/Chest: Effort normal. No respiratory distress.  Abdominal: She exhibits no distension.  Neurological: She is alert and oriented to person, place, and time.  Skin: Skin is warm and dry. She is not diaphoretic.  Psychiatric: Her behavior is normal.  Nursing note and vitals reviewed.   ED Course  Procedures  Results for orders placed or performed during the hospital encounter of 05/25/16  Comprehensive metabolic panel  Result Value Ref Range   Sodium 137 135 - 145 mmol/L   Potassium 3.4 (L) 3.5 - 5.1 mmol/L   Chloride 103 101 - 111 mmol/L   CO2 23 22 - 32 mmol/L   Glucose,  Bld 117 (H) 65 - 99 mg/dL   BUN 12 6 - 20 mg/dL   Creatinine, Ser 05/27/16 0.44 - 1.00 mg/dL   Calcium 9.2 8.9 - 9.93 mg/dL   Total Protein 8.4 (H) 6.5 - 8.1 g/dL   Albumin 3.5 3.5 - 5.0 g/dL   AST 16 15 - 41 U/L   ALT 10 (L) 14 - 54 U/L   Alkaline Phosphatase 70 38 - 126 U/L   Total Bilirubin 0.6 0.3 - 1.2 mg/dL   GFR calc non Af Amer >60 >60 mL/min   GFR calc Af Amer >60 >60 mL/min   Anion gap 11 5 - 15  Ethanol  Result Value Ref Range   Alcohol, Ethyl (B) <5 <5 mg/dL  CBC with Diff  Result Value Ref Range   WBC 6.4 4.0 - 10.5 K/uL   RBC 4.08 3.87 - 5.11 MIL/uL   Hemoglobin 9.8 (L) 12.0 - 15.0 g/dL   HCT 71.6 (L) 96.7 - 89.3 %   MCV 74.0 (L) 78.0 - 100.0 fL   MCH 24.0 (L) 26.0 - 34.0 pg   MCHC 32.5 30.0 - 36.0 g/dL   RDW 81.0 (H) 17.5 - 10.2 %   Platelets 357 150 - 400 K/uL   Neutrophils Relative % 65 %   Neutro Abs 4.1 1.7 - 7.7 K/uL   Lymphocytes Relative 28 %   Lymphs Abs 1.8 0.7 - 4.0 K/uL  Monocytes Relative 7 %   Monocytes Absolute 0.5 0.1 - 1.0 K/uL   Eosinophils Relative 0 %   Eosinophils Absolute 0.0 0.0 - 0.7 K/uL   Basophils Relative 0 %   Basophils Absolute 0.0 0.0 - 0.1 K/uL  Urine rapid drug screen (hosp performed)not at [facility]  Result Value Ref Range   Opiates NONE DETECTED NONE DETECTED   Cocaine NONE DETECTED NONE DETECTED   Benzodiazepines NONE DETECTED NONE DETECTED   Amphetamines NONE DETECTED NONE DETECTED   Tetrahydrocannabinol NONE DETECTED NONE DETECTED   Barbiturates NONE DETECTED NONE DETECTED  Salicylate level  Result Value Ref Range   Salicylate Lvl <4.0 2.8 - 30.0 mg/dL  Acetaminophen level  Result Value Ref Range   Acetaminophen (Tylenol), Serum <10 (L) 10 - 30 ug/mL     MDM Pt has rested comfortably throughout the night. Home meds ordered. TTS consult is still pending. Disposition is pending TTS consult. Signed out to [employer], [name] at end of shift.       Carlene Coria, [name] 05/26/16 [id number]    Arby Barrette, MD 05/26/16  [phone]

## 2016-05-25 NOTE — ED Triage Notes (Addendum)
Patient here from home, trying to get placement into an assisted living facility. She reports that she was in [facility] for depression and was discharged on 9/4 before she was able to be placed. Patient has dx of depression and states that she is unable to care for herself any more do to physical limitations and is uncomfortable living alone.

## 2016-05-25 NOTE — ED Notes (Signed)
Pt states that she does NOT want to hurt herself. She states that she cannot care for herself and needs placement in an assistive facility. Pt also states that she has not had her normal medications in a week. Pt specifically requesting tramadol and prednisone.

## 2016-05-25 NOTE — Progress Notes (Addendum)
EDCM consulted to speak to patient regarding home health services, ALf placement.  EDCM and EDSW spoke to patient at bedside.  Patient confirms she lives at home alone.  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  Patient also with osteoporosis, chronic joint and back pain and partial amputation of left foot.  [facility] noted patient ambulating in ED without difficulty.  Patient reports she uses a cane at times to ambulate.  Patient reports she tripped and fell yesterday, doesn't fall often.    Patient recently discharged from [facility].  Patient reports she has had home health services in the past and believes it was with [facility].  Patient reports since being discharged from [facility], she has been without her medications, she reports just eating crackers.  She reports she is unable to use the stove at home due to her hand deformities.  She reports she has had private duty services with [facility] for four hours a week but reports it was cancelled as patient's sister could no longer afford it.  Patient does receive disability paycheck.  She reports she does not qualify for Medicaid because her check is too much.  She reports from previous OT at home she has "reachers" to help her pick up things.  She also has a raised toilet seat and a shower bench.  Patient reports she does not require any further dme at this time.  She confirms her pcp is [facility] [employer] [facility].  She reports she sees her every three months.  Patient reports she has difficulty getting to her appointments, drug store and supermarket due to transportation issues and sometimes requires assistance in the community due to her condition.  She reports when she had the private duty nursing services she would have the nurse take her to her appointments and food shopping.  She admits to driving herself to the ED today, but  feels that wasn't a good idea as her condition is worsening and was an unsafe decision.   EDCM provided patient with list of home health agencies in [place] explained services, contact information for [facility], information for [facility] for transportation, contact information for [facility], contact information for [facility], and contact information for [facility] for senior wheels program.  Patient reports she has had this service in the past. [facility] explained choice connections can assist patient with placement.   Patient does not qualify for [facility].  EDCM assisted patient in filling out [facility] application and faxed to [facility] with confirmation of receipt.  Patient has chosen [facility] for [facility] services.  [facility] faxed HH referral to [facility] with confirmation of receipt.  [facility] faxed referral to [facility] and [facility] with patient's permission with confirmation of receipt.  EDCM unable to reach [facility] at this time.  Will contact tomorrow for meals on wheels program.  Patient very thankful for resources and assistance, but does not feel she can go home right now due to her mental state.  She reports still feeling very depressed regarding her condition, reports not taking her medications (didn't get scripts filled).  She reports she doesn't really want to go to an ALF, but her family thinks this is the best decision.  Patient reports she doesn't have a choice but to go. Reports she doesn't care if she dies.  Discussed patient with EDPA.  TTS consult placed.

## 2016-05-25 NOTE — ED Provider Notes (Signed)
[facility]-EMERGENCY DEPT Provider Note   CSN: [id number] Arrival date & time: 05/25/16  1452     History   Chief Complaint Chief Complaint  Patient presents with  . Depression    HPI Tara Brown is a 55 y.o. female with a past medical history of rheumatoid arthritis and depression who presented to the emergency department with complaint of depression and difficulty with her living situation. The patient was admitted to behavioral health [facility] after attempted suicide by overdosing on prescription sleeping medications. According to the notes, the patient awoke the next day and was disappointed that she had not died. Patient was admitted to the behavioral health hospital for 23 days. Patient states that the case manager was looking to help place her in an assisted living facility. She has significant deformity of her  hands secondary to her rheumatoid arthritis. She has had transmetatarsal amputations of both feet. The patient states that she is unable to care for herself properly. She is unable to get to the grocery store. Her doctor's appointments. She has been able to take her medications because she cannot get to her the doctor. She states she can't watch her in here because of her physical deformities. She feels extremely depressed and states that she just wishes she could die. She has significant chronic pain.  HPI  Past Medical History:  Diagnosis Date  . Anemia   . Arthritis   . 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 Osteoporosis     Patient Active Problem List   Diagnosis Date Noted  . Back pain 04/25/2016  . Major depressive disorder, recurrent severe without psychotic features (HCC) 04/24/2016  . Left foot infection 12/06/2015  . Suicidal ideations 12/06/2015  . Rheumatoid arthritis (HCC) 12/06/2015  . Foot infection 12/05/2015  . Cellulitis of toe, left   . Chronic osteomyelitis (HCC) 09/07/2015  . Anemia, iron  deficiency   . Essential hypertension   . Anemia of chronic disease 03/08/2015    Past Surgical History:  Procedure Laterality Date  . AMPUTATION Left 12/07/2015   Procedure: AMPUTATION LEFT GREAT TOE;  Surgeon: Nadara Mustard, MD;  Location: [facility];  Service: Orthopedics;  Laterality: Left;  . FOOT SURGERY    . HAND SURGERY    . HIP FRACTURE SURGERY    . KNEE SURGERY    . NECK SURGERY      OB History    No data available       Home Medications    Prior to Admission medications   Medication Sig Start Date End Date Taking? Authorizing Provider  calcium-vitamin D (OSCAL WITH D) 500-200 MG-UNIT tablet Take 1 tablet by mouth 2 (two) times daily.    Historical Provider, MD  DULoxetine (CYMBALTA) 60 MG capsule Take 1 capsule (60 mg total) by mouth daily. 05/15/16   Oneta Rack, NP  ferrous sulfate 325 (65 FE) MG tablet Take 1 tablet (325 mg total) by mouth 2 (two) times daily with a meal. 05/15/16   Oneta Rack, NP  fluocinonide cream (LIDEX) 0.05 % Apply 1 application topically 2 (two) times daily as needed (for psorasis).    Historical Provider, MD  folic acid (FOLVITE) 1 MG tablet Take 1 mg by mouth daily. 10/12/15   Historical Provider, MD  HUMIRA PEN 40 MG/0.8ML PNKT Inject 40 mg into the skin every 14 (fourteen) days. 04/12/16   Historical Provider, MD  hydrOXYzine (ATARAX/VISTARIL) 25 MG tablet Take 1 tablet (25 mg total)  by mouth 2 (two) times daily as needed for anxiety. 05/15/16   Oneta Rack, NP  lisinopril (PRINIVIL,ZESTRIL) 40 MG tablet Take 40 mg by mouth daily. 11/26/14   Historical Provider, MD  methotrexate (RHEUMATREX) 2.5 MG tablet Take 7.5 mg by mouth 2 (two) times a week. Thursday and Friday 10/12/15   Historical Provider, MD  traZODone (DESYREL) 100 MG tablet Take 1 tablet (100 mg total) by mouth at bedtime as needed for sleep. 05/15/16   Oneta Rack, NP    Family History Family History  Problem Relation Age of Onset  . Bipolar disorder Sister   . Suicidality  Other   . Schizophrenia Other   . Stroke Neg Hx     Social History Social History  Substance Use Topics  . Smoking status: Never Smoker  . Smokeless tobacco: Never Used  . Alcohol use No     Allergies   Review of patient's allergies indicates no known allergies.   Review of Systems Review of Systems  Ten systems reviewed and are negative for acute change, except as noted in the HPI.   Physical Exam Updated Vital Signs BP (!) 130/106 (BP Location: Left Arm)   Pulse 105   Temp 98.2 F (36.8 C) (Oral)   Resp 18   Ht 5\' 6"  (1.676 m)   Wt 47.6 kg   SpO2 97%   BMI 16.95 kg/m   Physical Exam  Constitutional: She is oriented to person, place, and time. She appears well-developed and well-nourished. No distress.  HENT:  Head: Normocephalic and atraumatic.  Eyes: Conjunctivae are normal. No scleral icterus.  Neck: Normal range of motion.  Cardiovascular: Normal rate, regular rhythm and normal heart sounds.  Exam reveals no gallop and no friction rub.   No murmur heard. Pulmonary/Chest: Effort normal and breath sounds normal. No respiratory distress.  Abdominal: Soft. Bowel sounds are normal. She exhibits no distension and no mass. There is no tenderness. There is no guarding.  Musculoskeletal:  Multiple deformities secondary to RA  Neurological: She is alert and oriented to person, place, and time.  Skin: Skin is warm and dry. She is not diaphoretic.  Nursing note and vitals reviewed.    ED Treatments / Results  Labs (all labs ordered are listed, but only abnormal results are displayed) Labs Reviewed  CBC WITH DIFFERENTIAL/PLATELET - Abnormal; Notable for the following:       Result Value   Hemoglobin 9.8 (*)    HCT 30.2 (*)    MCV 74.0 (*)    MCH 24.0 (*)    RDW 20.6 (*)    All other components within normal limits  COMPREHENSIVE METABOLIC PANEL  ETHANOL  URINE RAPID DRUG SCREEN, HOSP PERFORMED  SALICYLATE LEVEL  ACETAMINOPHEN LEVEL    EKG  EKG  Interpretation None       Radiology No results found.  Procedures Procedures (including critical care time)  Medications Ordered in ED Medications - No data to display   Initial Impression / Assessment and Plan / ED Course  I have reviewed the triage vital signs and the nursing notes.  Pertinent labs & imaging results that were available during my care of the patient were reviewed by me and considered in my medical decision making (see chart for details).  Clinical Course     Patient seen by the case manager and social worker. She is complaining of severe depression . She is not currently suicidal. Will have TTS consult. I have  given sign  out to [facility]. The patient. Final Clinical Impressions(s) / ED Diagnoses   Final diagnoses:  None    New Prescriptions New Prescriptions   No medications on file     Arthor Captain, [name] 05/25/16 2109    Jacalyn Lefevre, MD 05/26/16 0000

## 2016-05-25 NOTE — Progress Notes (Signed)
Per chart review patient has a CM with [facility] named Hanley Ben [phone], who is also trying to assist patient with placement.  Patient reports she was working with [facility] SW Chad Cordial for placement as well.

## 2016-05-25 NOTE — Progress Notes (Signed)
CSW saw patient with [facility] and patient does not currently want to live at a assisted living facility but her family thinks that is what is best. [facility] will be setting up HH/PT/ social work for patient once stable for discharge.   Stacy Gardner, LCSWA Clinical Social Worker [phone]

## 2016-05-26 DIAGNOSIS — R45851 Suicidal ideations: Secondary | ICD-10-CM

## 2016-05-26 DIAGNOSIS — F332 Major depressive disorder, recurrent severe without psychotic features: Secondary | ICD-10-CM

## 2016-05-26 MED ORDER — FOLIC ACID 1 MG PO TABS
1.0000 mg | ORAL_TABLET | Freq: Every day | ORAL | Status: DC
  Administered 2016-05-26 – 2016-05-28 (×3): 1 mg via ORAL
  Filled 2016-05-26 (×3): qty 1

## 2016-05-26 MED ORDER — LISINOPRIL 40 MG PO TABS
40.0000 mg | ORAL_TABLET | Freq: Every day | ORAL | Status: DC
  Administered 2016-05-26 – 2016-05-27 (×2): 40 mg via ORAL
  Filled 2016-05-26 (×3): qty 1

## 2016-05-26 MED ORDER — FLUOCINONIDE 0.05 % EX CREA
1.0000 "application " | TOPICAL_CREAM | Freq: Two times a day (BID) | CUTANEOUS | Status: DC | PRN
  Filled 2016-05-26: qty 30

## 2016-05-26 MED ORDER — FERROUS SULFATE 325 (65 FE) MG PO TABS
325.0000 mg | ORAL_TABLET | Freq: Two times a day (BID) | ORAL | Status: DC
  Administered 2016-05-26 – 2016-05-28 (×5): 325 mg via ORAL
  Filled 2016-05-26 (×7): qty 1

## 2016-05-26 MED ORDER — CALCIUM CARBONATE-VITAMIN D 500-200 MG-UNIT PO TABS
1.0000 | ORAL_TABLET | Freq: Two times a day (BID) | ORAL | Status: DC
  Administered 2016-05-26 – 2016-05-28 (×5): 1 via ORAL
  Filled 2016-05-26 (×7): qty 1

## 2016-05-26 MED ORDER — ADALIMUMAB 40 MG/0.8ML ~~LOC~~ AJKT: 40.0000 mg | AUTO-INJECTOR | SUBCUTANEOUS | Status: DC

## 2016-05-26 MED ORDER — TRAMADOL HCL 50 MG PO TABS
50.0000 mg | ORAL_TABLET | Freq: Four times a day (QID) | ORAL | Status: DC | PRN
  Administered 2016-05-26 – 2016-05-28 (×8): 50 mg via ORAL
  Filled 2016-05-26 (×8): qty 1

## 2016-05-26 MED ORDER — METHOTREXATE 2.5 MG PO TABS
7.5000 mg | ORAL_TABLET | ORAL | Status: DC
  Administered 2016-05-26: 7.5 mg via ORAL
  Filled 2016-05-26: qty 3

## 2016-05-26 MED ORDER — PREDNISONE 20 MG PO TABS
10.0000 mg | ORAL_TABLET | Freq: Every day | ORAL | Status: DC
  Administered 2016-05-26 – 2016-05-28 (×3): 10 mg via ORAL
  Filled 2016-05-26 (×3): qty 1

## 2016-05-26 MED ORDER — TRAZODONE HCL 100 MG PO TABS
100.0000 mg | ORAL_TABLET | Freq: Every evening | ORAL | Status: DC | PRN
  Administered 2016-05-26 – 2016-05-27 (×2): 100 mg via ORAL
  Filled 2016-05-26 (×2): qty 1

## 2016-05-26 MED ORDER — DULOXETINE HCL 60 MG PO CPEP
60.0000 mg | ORAL_CAPSULE | Freq: Every day | ORAL | Status: DC
  Administered 2016-05-26 – 2016-05-28 (×3): 60 mg via ORAL
  Filled 2016-05-26 (×3): qty 1

## 2016-05-26 NOTE — ED Notes (Signed)
TTS called to notify us that the pt would be seen this morning by psychiatry.

## 2016-05-26 NOTE — Progress Notes (Addendum)
Pt is a 55 y/o caucasian female admitted to [facility] under IVC status related to threats to harm self. Per report, pt stated "I'll go home and do the only thing I know how to do, kill myself" as she was being d/c from the ED. Pt presents with depressed affect and mood. Endorsed passive SI when assessed, stated "I do think about it at home, it's hard when I can't care for myself like I used to", denies HI, AVH and pain at this time. Pt stated she's depressed related to not being placed into an assisted living facility from her Rheumatoid Arthritis while she was still in patient at [facility]. Pt stated that she does experienced difficulties with ADLs including bathing and meal preparation; her sister was paying an aid to assist her but "It got too expensive". Pt is ambulatory with a steady. Support and availability provided to pt. Encouraged to voice needs / concerns. Unit orientation and routines discussed, understanding verbalized. Q 15 minutes safety checks maintained without self harm gestures thus far this shift. Will continue to monitor pt for safety and mood stabilization.

## 2016-05-26 NOTE — ED Notes (Signed)
Patient was informed that she will be discharged, but Social Worker will come speak to her.  Patient stated, "Fine, I'm going to go home and do the only thing I know to do."  When RN inquired as to what that was, patient replied, "Suicide."  Dr. Jannifer Franklin informed and stated he will IVC patient.  Patient informed and changed in to paper scrubs.  Security notified to wand and search patient.

## 2016-05-26 NOTE — Progress Notes (Signed)
ED CM sent a message to covering [facility] coordinator [phone] that the referral for [facility], PT, OT, Aide,Sw for pt is pending possible [facility] inpatient placement  Will update later on

## 2016-05-26 NOTE — [facility] Counselor (Signed)
[facility] Assessment Progress Note  IVC paperwork has been completed by Dr. Jannifer Franklin and faxed to Magistrate's office. Magistrate Gwenevere Abbot has ver'd receipt of paperwork and understands that request is for service only.   Johny Shock. Ladona Ridgel, MS, NCC, LPCA Counselor

## 2016-05-26 NOTE — Progress Notes (Deleted)
05/26/16 1344:  Pt went to offer activities to pt.  Pt was sleep.  Caroll Rancher, LRT/CTRS

## 2016-05-26 NOTE — Progress Notes (Signed)
Called [place] [facility] [phone] and spoke with Richard to complete a referral for [facility]  Gerlene Burdock confirms pt is not eligible for mobile meals via [facility] because she is not 60+ but offered a [facility] [place] as [phone] that should be able to assist pt  Gerlene Burdock will mail pt the registration package for [facility] Pt will need to complete it and mail or take it back in and she she be able to start receiving [facility] services at that time

## 2016-05-26 NOTE — Progress Notes (Signed)
Entered in d/c instructions You have been referred to [facility] for a program called [facility]     You will be mailed a package to complete to you home address Please complete it and return it to get you services started  If you have questions about these services call [phone]    Next Steps: Call today  Instructions: as needed  [facility] [place]     [phone]  A referral was called in to this agency to assist with mobile meals for you  You may call them to get and updated on services as needed

## 2016-05-26 NOTE — Progress Notes (Signed)
CSW received voicemail from Pt stating that she was in the ED and was being discharged because they would not place her in an assisted living facility. Pt then stated she was "leaving and not coming back." Pt then hung up the phone.   This CSW has worked with Pt multiple times while inpatient at [facility].  Chad Cordial, LCSW Clinical Social Work [phone]

## 2016-05-26 NOTE — Progress Notes (Signed)
ED CM called [phone] to call and leave a message as directed by Gerlene Burdock for referral to [place] [facility] CM left pt info and CM mobile number for questions  Pt updated

## 2016-05-26 NOTE — Progress Notes (Signed)
ED CM spoke with ED SW, Lavionia after noting the note in EPIC from [employer] SW indicating pt called to tell unit SW she was being d/c and would do what she knew to do at home This ED AM CM and the ED PM CM spoke about pt this am, Spoke about the home health services and other resource referrals Plus was awaiting SAPPU evaluation   1324 ED SW, Tacey Heap confirms pt has now been IVC'd, looking for [facility] inpatient placement

## 2016-05-26 NOTE — ED Provider Notes (Signed)
Patient signed out to me by S Sam [name]. Patient is a 55 year old female with severe pain due to RA. Please see previous PA notes for more details regarding encounter. TTS consult is pending.  TTS recommends discharge. Following these recommendations patient states she will go home and kill herself. Patient IVC'd and placed in psych hold.    Bethel Born, [name] 05/26/16 1134    Nira Conn, MD 05/26/16 [phone]

## 2016-05-26 NOTE — Consult Note (Signed)
Tara Brown Psychiatry Consult   Reason for Consult:  Worsening depression Referring Physician:  ED Provider Patient Identification: Tara Brown MRN:  [id number] Principal Diagnosis: Major depressive disorder, recurrent severe without psychotic features [facility]) Diagnosis:   Patient Active Problem List   Diagnosis Date Noted  . Back pain [M54.9] 04/25/2016  . Major depressive disorder, recurrent severe without psychotic features ([place]) [F33.2] 04/24/2016  . Left foot infection [L08.9] 12/06/2015  . Suicidal ideations [R45.851] 12/06/2015  . Rheumatoid arthritis ([place]) [M06.9] 12/06/2015  . Foot infection [L08.9] 12/05/2015  . Cellulitis of toe, left [L03.032]   . Chronic osteomyelitis ([place]) [M86.60] 09/07/2015  . Anemia, iron deficiency [D50.9]   . Essential hypertension [I10]   . Anemia of chronic disease [D63.8] 03/08/2015    Total Time spent with patient: 45 minutes  Subjective:   Tara Brown is a 55 y.o. female patient admitted with worsening depression.  HPI:  Tara Brown is an 55 y.o. female, who presents voluntarily and unaccompanied to [facility]. Pt reported she was admitted to [facility] from 04/24/16-05/15/16 for a attempted overdose (pt reported taking 30 Ambien 10 mg).  She was discharged and she as to follow up at the [facility] for further help into getting placed into an assisted living.  She is presenting again to ED after worsening depression.  She stated that "someone told me that if I come to the hospital, I will have better chance of being placed.   She denied SI HI AVH.  "I just need help with getting to an assisted living."  She came back to [facility] because she continues to feel very depressed. Pt reported having fleeting thoughts-wanting to be gone, not wanting to deal with her chronic pain.  Pt reported she has Osteoporosis (pt reports since 1993, when she broke her hip), chronic joint and back pain, and Rheumatoid arthritis (pt reported since age  28).  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    Discussed with patient that when she c/o suicidal ideation, it will be difficult to place her into an facility.  Patient became irritable and demanded to be released.  Then later recanted that statement.  She told nursing staff, "just send me out so I can go kill myself."    Past Psychiatric History: see HPI  Risk to Self: Suicidal Ideation: Yes-Currently Present Suicidal Intent: No-Not Currently/Within Last 6 Months Is patient at risk for suicide?: Yes Suicidal Plan?: No-Not Currently/Within Last 6 Months Specify Current Suicidal Plan:  (Pt overdoses on 30 Ambien 10 mg tablets.) Access to Means: No What has been your use of drugs/alcohol within the last 12 months?:  (Pt denied.) How many times?:  (1) Other Self Harm Risks: Pt denies. Triggers for Past Attempts: Other (Comment) (Pt reported depressed about health, in pain all the time. ) Intentional Self Injurious Behavior: None Risk to Others: Homicidal Ideation: No (Pt denies. ) Thoughts of Harm to Others: No (Pt denies. ) Current Homicidal Intent: No (Pt denies. ) Current Homicidal Plan: No (Pt denies. ) Access to Homicidal Means: No (Pt denies. ) Identified Victim:  (NA) History of harm to others?: No (Pt denies. ) Assessment of Violence: None Noted Violent Behavior Description: NA Does patient have access to weapons?: No (Pt denies. ) Criminal Charges Pending?: No Does patient have a court date: No Prior Inpatient Therapy: Prior Inpatient Therapy: Yes Prior Therapy Dates: December 2016-Jan 2017, 04/14/16-05/15/16 Prior Therapy Facilty/Provider(s): [facility] Reason for Treatment: Depressive symptoms, attempted overdose.  Prior  Outpatient Therapy: Prior Outpatient Therapy: No Prior Therapy Dates:  (NA) Prior Therapy Facilty/Provider(s): NA Reason for Treatment: NA Does patient have an ACCT team?: No Does patient have Intensive In-House Services?  : No Does  patient have [facility] services? : No Does patient have P4CC services?: No  Past Medical History:  Past Medical History:  Diagnosis Date  . Anemia   . Arthritis   . Major depressive disorder, recurrent, severe without psychotic features ([place]) 03/11/2015  . Osteoporosis     Past Surgical History:  Procedure Laterality Date  . AMPUTATION Left 12/07/2015   Procedure: AMPUTATION LEFT GREAT TOE;  Surgeon: Newt Minion, MD;  Location: [facility];  Service: Orthopedics;  Laterality: Left;  . FOOT SURGERY    . HAND SURGERY    . HIP FRACTURE SURGERY    . KNEE SURGERY    . NECK SURGERY     Family History:  Family History  Problem Relation Age of Onset  . Bipolar disorder Sister   . Suicidality Other   . Schizophrenia Other   . Stroke Neg Hx    Family Psychiatric  History: see HPI Social History:  History  Alcohol Use No     History  Drug Use No    Social History   Social History  . Marital status: Divorced    Spouse name: N/A  . Number of children: N/A  . Years of education: N/A   Social History Main Topics  . Smoking status: Never Smoker  . Smokeless tobacco: Never Used  . Alcohol use No  . Drug use: No  . Sexual activity: No   Other Topics Concern  . None   Social History Narrative  . None   Additional Social History:    Allergies:  No Known Allergies  Labs:  Results for orders placed or performed during the hospital encounter of 05/25/16 (from the past 48 hour(s))  Comprehensive metabolic panel     Status: Abnormal   Collection Time: 05/25/16  7:57 PM  Result Value Ref Range   Sodium 137 135 - 145 mmol/L   Potassium 3.4 (L) 3.5 - 5.1 mmol/L   Chloride 103 101 - 111 mmol/L   CO2 23 22 - 32 mmol/L   Glucose, Bld 117 (H) 65 - 99 mg/dL   BUN 12 6 - 20 mg/dL   Creatinine, Ser 0.81 0.44 - 1.00 mg/dL   Calcium 9.2 8.9 - 10.3 mg/dL   Total Protein 8.4 (H) 6.5 - 8.1 g/dL   Albumin 3.5 3.5 - 5.0 g/dL   AST 16 15 - 41 U/L   ALT 10 (L) 14 - 54 U/L   Alkaline  Phosphatase 70 38 - 126 U/L   Total Bilirubin 0.6 0.3 - 1.2 mg/dL   GFR calc non Af Amer >60 >60 mL/min   GFR calc Af Amer >60 >60 mL/min    Comment: (NOTE) The eGFR has been calculated using the CKD EPI equation. This calculation has not been validated in all clinical situations. eGFR's persistently <60 mL/min signify possible Chronic Kidney Disease.    Anion gap 11 5 - 15  Ethanol     Status: None   Collection Time: 05/25/16  7:57 PM  Result Value Ref Range   Alcohol, Ethyl (B) <5 <5 mg/dL    Comment:        LOWEST DETECTABLE LIMIT FOR SERUM ALCOHOL IS 5 mg/dL FOR MEDICAL PURPOSES ONLY   CBC with Diff     Status: Abnormal  Collection Time: 05/25/16  7:57 PM  Result Value Ref Range   WBC 6.4 4.0 - 10.5 K/uL   RBC 4.08 3.87 - 5.11 MIL/uL   Hemoglobin 9.8 (L) 12.0 - 15.0 g/dL   HCT 30.2 (L) 36.0 - 46.0 %   MCV 74.0 (L) 78.0 - 100.0 fL   MCH 24.0 (L) 26.0 - 34.0 pg   MCHC 32.5 30.0 - 36.0 g/dL   RDW 20.6 (H) 11.5 - 15.5 %   Platelets 357 150 - 400 K/uL   Neutrophils Relative % 65 %   Neutro Abs 4.1 1.7 - 7.7 K/uL   Lymphocytes Relative 28 %   Lymphs Abs 1.8 0.7 - 4.0 K/uL   Monocytes Relative 7 %   Monocytes Absolute 0.5 0.1 - 1.0 K/uL   Eosinophils Relative 0 %   Eosinophils Absolute 0.0 0.0 - 0.7 K/uL   Basophils Relative 0 %   Basophils Absolute 0.0 0.0 - 0.1 K/uL  Salicylate level     Status: None   Collection Time: 05/25/16  7:57 PM  Result Value Ref Range   Salicylate Lvl <0.9 2.8 - 30.0 mg/dL  Acetaminophen level     Status: Abnormal   Collection Time: 05/25/16  7:57 PM  Result Value Ref Range   Acetaminophen (Tylenol), Serum <10 (L) 10 - 30 ug/mL    Comment:        THERAPEUTIC CONCENTRATIONS VARY SIGNIFICANTLY. A RANGE OF 10-30 ug/mL MAY BE AN EFFECTIVE CONCENTRATION FOR MANY PATIENTS. HOWEVER, SOME ARE BEST TREATED AT CONCENTRATIONS OUTSIDE THIS RANGE. ACETAMINOPHEN CONCENTRATIONS >150 ug/mL AT 4 HOURS AFTER INGESTION AND >50 ug/mL AT 12 HOURS  AFTER INGESTION ARE OFTEN ASSOCIATED WITH TOXIC REACTIONS.   Urine rapid drug screen (hosp performed)not at [facility]     Status: None   Collection Time: 05/25/16  7:59 PM  Result Value Ref Range   Opiates NONE DETECTED NONE DETECTED   Cocaine NONE DETECTED NONE DETECTED   Benzodiazepines NONE DETECTED NONE DETECTED   Amphetamines NONE DETECTED NONE DETECTED   Tetrahydrocannabinol NONE DETECTED NONE DETECTED   Barbiturates NONE DETECTED NONE DETECTED    Comment:        DRUG SCREEN FOR MEDICAL PURPOSES ONLY.  IF CONFIRMATION IS NEEDED FOR ANY PURPOSE, NOTIFY LAB WITHIN 5 DAYS.        LOWEST DETECTABLE LIMITS FOR URINE DRUG SCREEN Drug Class       Cutoff (ng/mL) Amphetamine      1000 Barbiturate      200 Benzodiazepine   628 Tricyclics       366 Opiates          300 Cocaine          300 THC              50     Current Facility-Administered Medications  Medication Dose Route Frequency Provider Last Rate Last Dose  . [START ON 05/29/2016] Adalimumab PNKT 40 mg  40 mg Subcutaneous Q14 Days Serena Y Sam, [name]      . calcium-vitamin D (OSCAL WITH D) 500-200 MG-UNIT per tablet 1 tablet  1 tablet Oral BID Serena Y Sam, [name]   1 tablet at 05/26/16 1057  . DULoxetine (CYMBALTA) DR capsule 60 mg  60 mg Oral Daily Serena Y Sam, [name]   60 mg at 05/26/16 1058  . ferrous sulfate tablet 325 mg  325 mg Oral BID WC Olivia Canter Sam, [name]   325 mg at 05/26/16 2947  . fluocinonide cream (LIDEX)  2.80 % 1 application  1 application Topical BID PRN Serena Y Sam, [name]      . folic acid (FOLVITE) tablet 1 mg  1 mg Oral Daily Serena Y Sam, [name]   1 mg at 05/26/16 1057  . lisinopril (PRINIVIL,ZESTRIL) tablet 40 mg  40 mg Oral Daily Serena Y Sam, [name]   40 mg at 05/26/16 1058  . methotrexate (RHEUMATREX) tablet 7.5 mg  7.5 mg Oral Once per day on Thu Fri Serena Y Sam, [name]   7.5 mg at 05/26/16 1059  . predniSONE (DELTASONE) tablet 10 mg  10 mg Oral Q breakfast Serena Y Sam, [name]   10 mg at 05/26/16 0825  .  traMADol (ULTRAM) tablet 50 mg  50 mg Oral Q6H PRN Olivia Canter Sam, [name]   50 mg at 05/26/16 0825  . traZODone (DESYREL) tablet 100 mg  100 mg Oral QHS PRN Anne Ng, [name]       Current Outpatient Prescriptions  Medication Sig Dispense Refill  . calcium-vitamin D (OSCAL WITH D) 500-200 MG-UNIT tablet Take 1 tablet by mouth 2 (two) times daily.    . DULoxetine (CYMBALTA) 60 MG capsule Take 1 capsule (60 mg total) by mouth daily. 30 capsule 0  . ferrous sulfate 325 (65 FE) MG tablet Take 1 tablet (325 mg total) by mouth 2 (two) times daily with a meal. 30 tablet 0  . folic acid (FOLVITE) 1 MG tablet Take 1 mg by mouth daily.  0  . HUMIRA PEN 40 MG/0.8ML PNKT Inject 40 mg into the skin every 14 (fourteen) days.  0  . lisinopril (PRINIVIL,ZESTRIL) 40 MG tablet Take 40 mg by mouth daily.    . methotrexate (RHEUMATREX) 2.5 MG tablet Take 7.5 mg by mouth 2 (two) times a week. Thursday and Friday  0  . predniSONE (DELTASONE) 10 MG tablet Take 10 mg by mouth daily with breakfast.    . traMADol (ULTRAM) 50 MG tablet Take 50 mg by mouth every 6 (six) hours as needed for moderate pain or severe pain.   0  . traZODone (DESYREL) 100 MG tablet Take 1 tablet (100 mg total) by mouth at bedtime as needed for sleep. 30 tablet 0  . fluocinonide cream (LIDEX) 0.34 % Apply 1 application topically 2 (two) times daily as needed (for psorasis).    . hydrOXYzine (ATARAX/VISTARIL) 25 MG tablet Take 1 tablet (25 mg total) by mouth 2 (two) times daily as needed for anxiety. (Patient not taking: Reported on 05/25/2016) 30 tablet 0    Musculoskeletal: Strength & Muscle Tone: within normal limits Gait & Station: normal Patient leans: N/A  Psychiatric Specialty Exam: Physical Exam  ROS  Blood pressure 142/85, pulse 90, temperature 98.8 F (37.1 C), temperature source Oral, resp. rate 18, height 5' 6"  (1.676 m), weight 47.6 kg (105 lb), SpO2 94 %.Body mass index is 16.95 kg/m.  General Appearance: Neat  Eye Contact:   Fair  Speech:  Normal Rate  Volume:  Normal  Mood:  Anxious and Depressed  Affect:  Constricted, Depressed and Labile  Thought Process:  Disorganized  Orientation:  Full (Time, Place, and Person)  Thought Content:  Rumination and Tangential  Suicidal Thoughts:  Yes.  without intent/plan  Homicidal Thoughts:  No  Memory:  Immediate;   Fair Recent;   Fair Remote;   Fair  Judgement:  Fair  Insight:  Fair  Psychomotor Activity:  Normal  Concentration:  Concentration: Fair and Attention Span: Fair  Recall:  Fair  Fund of Knowledge:  Fair  Language:  Good  Akathisia:  Negative  Handed:  Right  AIMS (if indicated):     Assets:  Resilience  ADL's:  Intact  Cognition:  WNL  Sleep:  poor   Treatment Plan Summary: Daily contact with patient to assess and evaluate symptoms and progress in treatment, Medication management and Plan seek inpatient   Disposition: Recommend psychiatric Inpatient admission when medically cleared. Supportive therapy provided about ongoing stressors. Discussed crisis plan, support from social network, calling 911, coming to the Emergency Department, and calling Suicide Hotline.  Janett Labella, NP [facility] 05/26/2016 3:48 PM  Patient seen face-to-face for psychiatric evaluation, chart reviewed and case discussed with the physician extender and developed treatment plan. Reviewed the information documented and agree with the treatment plan. Corena Pilgrim, MD

## 2016-05-26 NOTE — BH Assessment (Addendum)
Tele Assessment Note   Tara KannerJennifer Brown is an [age] y.o. female, who [facility]. Pt reported she [facility] from 04/24/16-05/15/16 for a attempted overdose (pt reported taking 30 Ambien 10 mg). Pt reported after ten days (05/25/16) [facility] because she continues to feel very depressed. Pt reported having fleeting thoughts-wanting to be gone, not wanting to deal with her chronic pain. Pt reported: "I feel like I want to be gone but I feel like I don't want to hurt myself." Pt denied HI, AVH and self-harming behaviors. Pt reported feeling depressed about her health, being in chronic pain. Pt reported she has Osteoporosis (pt reports since 1993, when she broke her hip), chronic joint and back pain, and Rheumatoid arthritis (pt reported since age 65thirteen).  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 Pt reports she wants to live at an assistant living facility to help manage her medication, get to appointments, assistance bathing, dressing, cooking, reaching for objects. Pt reported from previous OT she has a raised toilet seat, shower chair, "reachers" to pick up things. Pt reported she can get around without a cane. Pt reported she had a private duty nurse but it was cancelled because her sister could not afford it. Pt reported she does not qualify for Medicaid because her check is too much. Pt reported wanting her medications changed and being linked to an assistance living facility. Pt reported experiencing the following depressive symptoms: sadness/low mood, loss of interest, irritability, fatigue, feeling hopeless/worthless, difficulty concentrating, low self-esteem, and crying.   Pt denies verbal, physical, and sexual abuse. Pt denies substance usage. Pt denies having a psychiatrist (pt reported she can not afford it) or therapy. Pt reported she has not taken her medication in the past week. Pt reported her medication was changed  from Zoloft to Cymbalta and she is taking Trazodone.   Pt was disheveled (pt was in the dark), alert, with logical/coherent speech. Pt's eye contact was fair. Pt was oriented x4. Pt was cooperative throughout the assessment.  Pt is currently unable to contract for safety outside of the hospital.   Diagnosis: Major Depressive Disorder, Recurrent, Severe, without Psychotic Features.   Past Medical History:  Past Medical History:  Diagnosis Date  . Anemia   . Arthritis   . 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 Osteoporosis     Past Surgical History:  Procedure Laterality Date  . AMPUTATION Left 12/07/2015   Procedure: AMPUTATION LEFT GREAT TOE;  Surgeon: Nadara MustardMarcus Duda V, MD;  Location: [facility];  Service: Orthopedics;  Laterality: Left;  . FOOT SURGERY    . HAND SURGERY    . HIP FRACTURE SURGERY    . KNEE SURGERY    . NECK SURGERY      Family History:  Family History  Problem Relation Age of Onset  . Bipolar disorder Sister   . Suicidality Other   . Schizophrenia Other   . Stroke Neg Hx     Social History:  reports that she has never smoked. She has never used smokeless tobacco. She reports that she does not drink alcohol or use drugs.  Additional Social History:  Alcohol / Drug Use Pain Medications: Pt denies.  Prescriptions: Pt denies. Over the Counter: Pt denies.  History of alcohol / drug use?: No history of alcohol / drug abuse Longest period of sobriety (when/how long): NA  CIWA: CIWA-Ar BP: 133/93 Pulse Rate:  95 COWS:    PATIENT STRENGTHS: (choose at least two) Average or above average intelligence Communication skills General fund of knowledge  Allergies: No Known Allergies  Home Medications:  (Not in a hospital admission)  OB/GYN Status:  No LMP recorded. Patient is postmenopausal.  General Assessment Data Location of Assessment: [facility] TTS Assessment: In system Is this a Tele or Face-to-Face  Assessment?: Tele Assessment Is this an Initial Assessment or a Re-assessment for this encounter?: Initial Assessment Marital status: Single Maiden name: NA Is patient pregnant?: No Pregnancy Status: No Living Arrangements: Alone Can pt return to current living arrangement?: Yes Admission Status: Voluntary Is patient capable of signing voluntary admission?: Yes Referral Source: Self/Family/Friend Insurance type: [employer]     Crisis Care Plan Living Arrangements: Alone Legal Guardian: Other: (Self) Name of Psychiatrist: None currently Name of Therapist: None currently  Education Status Is patient currently in school?: No Current Grade: NA Highest grade of school patient has completed:  (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 : Yes Suicidal Intent: No-Not Currently/Within Last 6 Months Has patient had any suicidal intent within the past 6 months prior to admission? : Yes Is patient at risk for suicide?: Yes Suicidal Plan?: No-Not Currently/Within Last 6 Months Has patient had any suicidal plan within the past 6 months prior to admission? : Yes Specify Current Suicidal Plan:  (Pt overdoses on 30 Ambien 10 mg tablets.) Access to Means: No What has been your use of drugs/alcohol within the last 12 months?:  (Pt denied.) Previous Attempts/Gestures: No How many times?:  (1) Other Self Harm Risks: Pt denies. Triggers for Past Attempts: Other (Comment) (Pt reported depressed about health, in pain all the time. ) Intentional Self Injurious Behavior: None Family Suicide History: Yes Recent stressful life event(s): Other (Comment) (Pt reported in pain all the time. ) Persecutory voices/beliefs?: No Depression: Yes Depression Symptoms: Tearfulness, Feeling angry/irritable,  Feeling worthless/self pity, Loss of interest in usual pleasures (Sadness/low mood, low self-esteem, social isolation) Substance abuse history and/or treatment for substance abuse?: No Suicide prevention information given to non-admitted patients: Not applicable  Risk to Others within the past 6 months Homicidal Ideation: No (Pt denies. ) Does patient have any lifetime risk of violence toward others beyond the six months prior to admission? : No Thoughts of Harm to Others: No (Pt denies. ) Current Homicidal Intent: No (Pt denies. ) Current Homicidal Plan: No (Pt denies. ) Access to Homicidal Means: No (Pt denies. ) Identified Victim:  (NA) History of harm to others?: No (Pt denies. ) Assessment of Violence: None Noted Violent Behavior Description: NA Does patient have access to weapons?: No (Pt denies. ) Criminal Charges Pending?: No Does patient have a court date: No Is patient on probation?: No  Psychosis Hallucinations: None noted (Pt denies. ) Delusions: None noted (Pt denies. )  Mental Status Report Appearance/Hygiene: Disheveled Eye Contact: Fair Motor Activity: Unremarkable Speech: Logical/coherent Level of Consciousness: Alert Mood: Depressed, Sad Affect: Depressed Anxiety Level: None Thought Processes: Coherent, Relevant Judgement: Partial Orientation: Person, Place, Time, Situation Obsessive Compulsive Thoughts/Behaviors: None  Cognitive Functioning Concentration:  (Pt reported difficulty concentrating. ) Memory: Remote Intact, Recent Intact IQ: Average Insight: Fair Impulse Control: Fair Appetite: Poor Weight Loss:  (Pt reported 15-16 pounds over four months. ) Weight Gain:  0 Sleep: No Change Total Hours of Sleep: 8 Vegetative Symptoms: None  ADLScreening [facility] Assessment Services) Patient's cognitive ability adequate to safely complete daily activities?: Yes Patient able to express need for assistance with ADLs?: Yes Independently performs ADLs?:  No  Prior Inpatient Therapy Prior Inpatient Therapy: Yes Prior Therapy Dates: December 2016-Jan 2017, 04/14/16-05/15/16 Prior Therapy Facilty/Provider(s): [facility] Reason for Treatment: Depressive symptoms, attempted overdose.   Prior Outpatient Therapy Prior Outpatient Therapy: No Prior Therapy Dates:  (NA) Prior Therapy Facilty/Provider(s): NA Reason for Treatment: NA Does patient have an ACCT team?: No Does patient have Intensive In-House Services?  : No Does patient have [facility] services? : No Does patient have P4CC services?: No  ADL Screening (condition at time of admission) Patient's cognitive ability adequate to safely complete daily activities?: Yes Is the patient deaf or have difficulty hearing?: No Does the patient have difficulty seeing, even when wearing glasses/contacts?: No Does the patient have difficulty concentrating, remembering, or making decisions?:  (Pt reported difficulty concentrating. ) Patient able to express need for assistance with ADLs?: Yes Does the patient have difficulty dressing or bathing?: Yes Independently performs ADLs?: No Communication: Independent Dressing (OT): Needs assistance Is this a change from baseline?: Pre-admission baseline Grooming: Needs assistance Is this a change from baseline?: Pre-admission baseline Feeding: Independent Bathing: Needs assistance Is this a change from baseline?: Pre-admission baseline Toileting: Independent In/Out Bed: Independent Walks in Home: Independent Does the patient have difficulty walking or climbing stairs?: Yes Weakness of Legs: None Weakness of Arms/Hands: Both       Abuse/Neglect Assessment (Assessment to be complete while patient is alone) Physical Abuse: Denies (Pt denies. ) Verbal Abuse: Denies (Pt denies. ) Sexual Abuse: Denies (Pt denies. ) Exploitation of patient/patient's resources: Denies (Pt denies. ) Self-Neglect: Denies (Pt denies. )     Advance Directives (For  Healthcare) Does patient have an advance directive?: No Would patient like information on creating an advanced directive?: No - patient declined information    Additional Information 1:1 In Past 12 Months?: Yes CIRT Risk: No Elopement Risk: No Does patient have medical clearance?: Yes     Disposition: Maryjean Morn, PA recommends AM Psychiatric Evaluation. Disposition was discussed with Candice, RN.   Disposition Initial Assessment Completed for this Encounter: Yes Disposition of Patient: Other dispositions Other disposition(s): Other (Comment)  Gwinda Passe 05/26/2016 1:25 AM   Gwinda Passe, MS, [facility], [facility] Triage Specialist [phone]

## 2016-05-26 NOTE — Progress Notes (Signed)
EDCM called and left voice message for Theodoro Clock of [employer] to confirm application for SCAT has been received.  Left phone number for call back.  EDCM called [facility] and spoke to [facility] who reports referral [facility] sent was not received, but kindly took the referral over the phone and placed the patient on the waiting list.  Tresa Endo also provided [facility] with information for DSS in home aid program which provides services for free, however there is an 8-12 month waiting list for services.  EDCM went to speak to patient at bedside and updated her of above information.  Patient reports she is currently on the DSS in home aide program and is aware there is a waiting period.  EDCM placed phone number for DSS in home aide program with printed information [facility] provided patient last evening and placed in plastic bag with patient's belongings in closet in [facility].  Tech assisted EDCM to unlock closet.  Patient very thankful for assistance.  No further EDCM needs at this time.

## 2016-05-27 NOTE — BH Assessment (Signed)
Patient was reassessed  By TTS.   Patient was observed in her room watching television. Patient states that she has "fleeting" suicidal thoughts and denies HI. Patient states that she is depressed. Patient denies AVH and does not appear to be responding to internal stimuli.    Consulted with Dr. Jannifer Franklin who continues to recommend inpatient treatment.   Davina Poke, LCSW Therapeutic Triage Specialist [facility] 05/27/2016 11:26 AM

## 2016-05-27 NOTE — ED Notes (Signed)
Patient admits to [facility] with no plan. Patient denies HI and AVH at this time. Patient is pleasant. Plan of care discussed and patient voices no complaints. Encouragement and support provided and safety maintain. Q 15 min safety checks remain in place and video monitoring.

## 2016-05-27 NOTE — Progress Notes (Signed)
Disposition CSW completed patient referrals to the following inpatient psych facilities:  [facility]  CSW will continue to follow patient for placement needs.  Seward Speck [facility] Disposition CSW [phone]

## 2016-05-27 NOTE — ED Notes (Signed)
Pt compliant with morning medication regimen. Pt presents with sad affect, withdrawn. Pt denies SI/HI, Denies AVH. Special checks q 15 mins in place for safety. Video monitoring in place.

## 2016-05-28 ENCOUNTER — Encounter (HOSPITAL_COMMUNITY): Payer: Self-pay | Admitting: Registered Nurse

## 2016-05-28 ENCOUNTER — Inpatient Hospital Stay
Admission: RE | Admit: 2016-05-28 | Discharge: 2016-06-05 | DRG: 885 | Disposition: A | Discharge status: Home / Self Care | Payer: Medicare Other | Source: Intra-hospital | Attending: Psychiatry | Admitting: Psychiatry

## 2016-05-28 DIAGNOSIS — F332 Major depressive disorder, recurrent severe without psychotic features: Principal | ICD-10-CM | POA: Diagnosis present

## 2016-05-28 DIAGNOSIS — Z9119 Patient's noncompliance with other medical treatment and regimen: Secondary | ICD-10-CM | POA: Diagnosis not present

## 2016-05-28 DIAGNOSIS — Z89412 Acquired absence of left great toe: Secondary | ICD-10-CM | POA: Diagnosis not present

## 2016-05-28 DIAGNOSIS — I10 Essential (primary) hypertension: Secondary | ICD-10-CM

## 2016-05-28 DIAGNOSIS — G894 Chronic pain syndrome: Secondary | ICD-10-CM | POA: Diagnosis not present

## 2016-05-28 DIAGNOSIS — M069 Rheumatoid arthritis, unspecified: Secondary | ICD-10-CM | POA: Diagnosis present

## 2016-05-28 DIAGNOSIS — M1990 Unspecified osteoarthritis, unspecified site: Secondary | ICD-10-CM | POA: Diagnosis not present

## 2016-05-28 DIAGNOSIS — R45851 Suicidal ideations: Secondary | ICD-10-CM | POA: Diagnosis not present

## 2016-05-28 DIAGNOSIS — Z818 Family history of other mental and behavioral disorders: Secondary | ICD-10-CM | POA: Diagnosis not present

## 2016-05-28 DIAGNOSIS — Z9889 Other specified postprocedural states: Secondary | ICD-10-CM | POA: Diagnosis not present

## 2016-05-28 DIAGNOSIS — G4700 Insomnia, unspecified: Secondary | ICD-10-CM | POA: Diagnosis present

## 2016-05-28 DIAGNOSIS — G8929 Other chronic pain: Secondary | ICD-10-CM | POA: Diagnosis present

## 2016-05-28 DIAGNOSIS — M810 Age-related osteoporosis without current pathological fracture: Secondary | ICD-10-CM

## 2016-05-28 DIAGNOSIS — K5900 Constipation, unspecified: Secondary | ICD-10-CM | POA: Diagnosis not present

## 2016-05-28 DIAGNOSIS — D638 Anemia in other chronic diseases classified elsewhere: Secondary | ICD-10-CM | POA: Diagnosis present

## 2016-05-28 DIAGNOSIS — Z23 Encounter for immunization: Secondary | ICD-10-CM

## 2016-05-28 MED ORDER — ALUM & MAG HYDROXIDE-SIMETH 200-200-20 MG/5ML PO SUSP: 30.0000 mL | ORAL | Status: DC | PRN

## 2016-05-28 MED ORDER — MAGNESIUM HYDROXIDE 400 MG/5ML PO SUSP: 30.0000 mL | Freq: Every day | ORAL | Status: DC | PRN

## 2016-05-28 MED ORDER — ADALIMUMAB 40 MG/0.8ML ~~LOC~~ AJKT: 40.0000 mg | AUTO-INJECTOR | SUBCUTANEOUS | Status: DC

## 2016-05-28 MED ORDER — TRAMADOL HCL 50 MG PO TABS
50.0000 mg | ORAL_TABLET | Freq: Four times a day (QID) | ORAL | Status: DC | PRN
  Administered 2016-05-28 – 2016-05-29 (×2): 50 mg via ORAL
  Filled 2016-05-28 (×2): qty 1

## 2016-05-28 MED ORDER — FOLIC ACID 1 MG PO TABS
1.0000 mg | ORAL_TABLET | Freq: Every day | ORAL | Status: DC
  Administered 2016-05-29 – 2016-06-05 (×8): 1 mg via ORAL
  Filled 2016-05-28 (×8): qty 1

## 2016-05-28 MED ORDER — TRAZODONE HCL 100 MG PO TABS
100.0000 mg | ORAL_TABLET | Freq: Every evening | ORAL | Status: DC | PRN
  Administered 2016-05-28: 100 mg via ORAL
  Filled 2016-05-28: qty 1

## 2016-05-28 MED ORDER — CALCIUM CARBONATE-VITAMIN D 500-200 MG-UNIT PO TABS
1.0000 | ORAL_TABLET | Freq: Two times a day (BID) | ORAL | Status: DC
  Administered 2016-05-28 – 2016-06-05 (×16): 1 via ORAL
  Filled 2016-05-28 (×17): qty 1

## 2016-05-28 MED ORDER — METHOTREXATE 2.5 MG PO TABS
7.5000 mg | ORAL_TABLET | ORAL | Status: DC
  Administered 2016-06-01 – 2016-06-02 (×2): 7.5 mg via ORAL
  Filled 2016-05-28 (×2): qty 3

## 2016-05-28 MED ORDER — DULOXETINE HCL 60 MG PO CPEP
60.0000 mg | ORAL_CAPSULE | Freq: Every day | ORAL | Status: DC
  Administered 2016-05-29 – 2016-06-05 (×8): 60 mg via ORAL
  Filled 2016-05-28 (×8): qty 1

## 2016-05-28 MED ORDER — LISINOPRIL 20 MG PO TABS
40.0000 mg | ORAL_TABLET | Freq: Every day | ORAL | Status: DC
  Administered 2016-05-29 – 2016-06-05 (×8): 40 mg via ORAL
  Filled 2016-05-28 (×8): qty 2

## 2016-05-28 MED ORDER — INFLUENZA VAC SPLIT QUAD 0.5 ML IM SUSY
0.5000 mL | PREFILLED_SYRINGE | INTRAMUSCULAR | Status: AC
  Administered 2016-05-29: 0.5 mL via INTRAMUSCULAR
  Filled 2016-05-28: qty 0.5

## 2016-05-28 MED ORDER — FERROUS SULFATE 325 (65 FE) MG PO TABS
325.0000 mg | ORAL_TABLET | Freq: Two times a day (BID) | ORAL | Status: DC
  Administered 2016-05-28 – 2016-06-05 (×16): 325 mg via ORAL
  Filled 2016-05-28 (×17): qty 1

## 2016-05-28 MED ORDER — PREDNISONE 10 MG PO TABS
10.0000 mg | ORAL_TABLET | Freq: Every day | ORAL | Status: DC
  Administered 2016-05-29 – 2016-06-05 (×8): 10 mg via ORAL
  Filled 2016-05-28 (×8): qty 1

## 2016-05-28 MED ORDER — FLUOCINONIDE 0.05 % EX CREA
1.0000 "application " | TOPICAL_CREAM | Freq: Two times a day (BID) | CUTANEOUS | Status: DC | PRN
  Filled 2016-05-28: qty 15

## 2016-05-28 MED ORDER — ACETAMINOPHEN 325 MG PO TABS: 650.0000 mg | ORAL_TABLET | Freq: Four times a day (QID) | ORAL | Status: DC | PRN

## 2016-05-28 NOTE — Plan of Care (Signed)
Problem: Education: Goal: Knowledge of the prescribed therapeutic regimen will improve Outcome: Progressing Pt asks questions appropriately regarding medication administration. Verbalizes understanding of information provided.  Problem: Safety: Goal: Periods of time without injury will increase Outcome: Progressing Pt remains free from harm.

## 2016-05-28 NOTE — Progress Notes (Addendum)
Admission Note:  Patient arrived to the unit at 1630 pm dressed in scrubs, unsteady gait present and noticeable flat affect.  Patient denies HI/AVH but endorses that has "fleeting thoughts of suicide when I am home".  Patient verbally contracted for safety.  Patient skin assessment completed with a second staff present and patient was found to have a skin tear on her right butt cheek, which she stated," I got that from my chair at home".  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  Patient hands and feet are in gnarled position related to arthritis.  Patient states that she can do most things for herself but requires assistance with ADLs at times.  Patient belongings searched and logged by staff.  Patient oriented to the unit and to her room.  Patient stated that she is depressed and that she wants to live in an assisted living home and that is why she came to the hospital.

## 2016-05-28 NOTE — ED Notes (Signed)
Sheriff on unit to transfer pt to [facility] per MD order. Pt signed for personal property and property given to sheriff for transport. Pt ambulatory off unit with sheriff.

## 2016-05-28 NOTE — BH Assessment (Signed)
Patient has been accepted to [facility].  Accepting physician is Dr Thedore Mins.  Attending Physician will be Dr. Ardyth Harps.  Patient has been assigned to room 325A, by [facility] Charge Nurse Cliff P.  Call report to [phone].  Representative/Transfer Coordinator is Dyanne Iha [facility] Lillia Abed, TTS) made aware of acceptance.  Pre-admitted by Patient Access Nedra Hai).

## 2016-05-28 NOTE — Tx Team (Signed)
Initial Treatment Plan 05/28/2016 5:22 PM Tara Brown IOM:[id number]    PATIENT STRESSORS:  Health problems Loss of independence   PATIENT STRENGTHS: Wellsite geologist fund of knowledge   PATIENT IDENTIFIED PROBLEMS: Major Depressive Disorder  Suicidal Ideations                   DISCHARGE CRITERIA:  Motivation to continue treatment in a less acute level of care Safe-care adequate arrangements made  PRELIMINARY DISCHARGE PLAN: Outpatient therapy  PATIENT/FAMILY INVOLVEMENT: This treatment plan has been presented to and reviewed with the patient, Tara Brown, and/or family member.  The patient and family have been given the opportunity to ask questions and make suggestions.  Santo Held, RN 05/28/2016, 5:22 PM

## 2016-05-28 NOTE — Consult Note (Signed)
[facility] Face-to-Face Psychiatry Consult   Reason for Consult:  Worsening depression Referring Physician:  ED Provider Patient Identification: Tara Brown MRN:  [id number] Principal Diagnosis: Major depressive disorder, recurrent severe without psychotic features [facility]) Diagnosis:   Patient Active Problem List   Diagnosis Date Noted  . Back pain [M54.9] 04/25/2016  . Major depressive disorder, recurrent severe without psychotic features (HCC) [F33.2] 04/24/2016  . Left foot infection [L08.9] 12/06/2015  . Suicidal ideations [R45.851] 12/06/2015  . Rheumatoid arthritis (HCC) [M06.9] 12/06/2015  . Foot infection [L08.9] 12/05/2015  . Cellulitis of toe, left [L03.032]   . Chronic osteomyelitis (HCC) [M86.60] 09/07/2015  . Anemia, iron deficiency [D50.9]   . Essential hypertension [I10]   . Anemia of chronic disease [D63.8] 03/08/2015    Total Time spent with patient: 25 minutes  Subjective:   Tara Brown is a 55 y.o. female patient admitted with worsening depression. patient seen by Dr. Jannifer Franklin and this provider.  Chart reviewed 05/28/16.   On evaluation:  Tara Brown reports that she continues to have worsening depression and doesn't feel safe going home.  She denies hallucinations and paranoia.    Will continue to seek inpatient treatment    Past Psychiatric History: see HPI  Risk to Self: Suicidal Ideation: Yes-Currently Present Suicidal Intent: No-Not Currently/Within Last 6 Months Is patient at risk for suicide?: Yes Suicidal Plan?: No-Not Currently/Within Last 6 Months Specify Current Suicidal Plan:  (Pt overdoses on 30 Ambien 10 mg tablets.) Access to Means: No What has been your use of drugs/alcohol within the last 12 months?:  (Pt denied.) How many times?:  (1) Other Self Harm Risks: Pt denies. Triggers for Past Attempts: Other (Comment) (Pt reported depressed about health, in pain all the time. ) Intentional Self Injurious Behavior: None Risk to Others:  Homicidal Ideation: No (Pt denies. ) Thoughts of Harm to Others: No (Pt denies. ) Current Homicidal Intent: No (Pt denies. ) Current Homicidal Plan: No (Pt denies. ) Access to Homicidal Means: No (Pt denies. ) Identified Victim:  (NA) History of harm to others?: No (Pt denies. ) Assessment of Violence: None Noted Violent Behavior Description: NA Does patient have access to weapons?: No (Pt denies. ) Criminal Charges Pending?: No Does patient have a court date: No Prior Inpatient Therapy: Prior Inpatient Therapy: Yes Prior Therapy Dates: December 2016-Jan 2017, 04/14/16-05/15/16 Prior Therapy Facilty/Provider(s): [facility] Reason for Treatment: Depressive symptoms, attempted overdose.  Prior Outpatient Therapy: Prior Outpatient Therapy: No Prior Therapy Dates:  (NA) Prior Therapy Facilty/Provider(s): NA Reason for Treatment: NA Does patient have an ACCT team?: No Does patient have Intensive In-House Services?  : No Does patient have [facility] services? : No Does patient have P4CC services?: No  Past Medical History:  Past Medical History:  Diagnosis Date  . Anemia   . Arthritis   . 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 Osteoporosis     Past Surgical History:  Procedure Laterality Date  . AMPUTATION Left 12/07/2015   Procedure: AMPUTATION LEFT GREAT TOE;  Surgeon: Nadara Mustard, MD;  Location: [facility]S;  Service: Orthopedics;  Laterality: Left;  . FOOT SURGERY    . HAND SURGERY    . HIP FRACTURE SURGERY    . KNEE SURGERY    . NECK SURGERY     Family History:  Family History  Problem Relation Age of Onset  . Bipolar disorder Sister   . Suicidality Other   . Schizophrenia Other   . Stroke Neg  Hx    Family Psychiatric  History: see HPI Social History:  History  Alcohol Use No     History  Drug Use No    Social History   Social History  . Marital status: Divorced    Spouse name: N/A  . Number of children: N/A  .  Years of education: N/A   Social History Main Topics  . Smoking status: Never Smoker  . Smokeless tobacco: Never Used  . Alcohol use No  . Drug use: No  . Sexual activity: No   Other Topics Concern  . None   Social History Narrative  . None   Additional Social History:    Allergies:  No Known Allergies  Labs:  No results found for this or any previous visit (from the past 48 hour(s)).  Current Facility-Administered Medications  Medication Dose Route Frequency Provider Last Rate Last Dose  . [START ON 05/29/2016] Adalimumab PNKT 40 mg  40 mg Subcutaneous Q14 Days Serena Y Sam, [name]      . calcium-vitamin D (OSCAL WITH D) 500-200 MG-UNIT per tablet 1 tablet  1 tablet Oral BID Ace Gins Sam, [name]   1 tablet at 05/28/16 0910  . DULoxetine (CYMBALTA) DR capsule 60 mg  60 mg Oral Daily Ace Gins Sam, [name]   60 mg at 05/28/16 0910  . ferrous sulfate tablet 325 mg  325 mg Oral BID WC Ace Gins Sam, [name]   325 mg at 05/28/16 0910  . fluocinonide cream (LIDEX) 0.05 % 1 application  1 application Topical BID PRN Serena Y Sam, [name]      . folic acid (FOLVITE) tablet 1 mg  1 mg Oral Daily Serena Y Sam, [name]   1 mg at 05/28/16 0911  . lisinopril (PRINIVIL,ZESTRIL) tablet 40 mg  40 mg Oral Daily Carlene Coria, [place]   Stopped at 05/28/16 4765  . methotrexate (RHEUMATREX) tablet 7.5 mg  7.5 mg Oral Once per day on Thu Fri Serena Y Sam, [name]   7.5 mg at 05/26/16 1059  . predniSONE (DELTASONE) tablet 10 mg  10 mg Oral Q breakfast Ace Gins Sam, [name]   10 mg at 05/28/16 4650  . traMADol (ULTRAM) tablet 50 mg  50 mg Oral Q6H PRN Ace Gins Sam, [name]   50 mg at 05/28/16 1236  . traZODone (DESYREL) tablet 100 mg  100 mg Oral QHS PRN Ace Gins Sam, [name]   100 mg at 05/27/16 2126   Current Outpatient Prescriptions  Medication Sig Dispense Refill  . calcium-vitamin D (OSCAL WITH D) 500-200 MG-UNIT tablet Take 1 tablet by mouth 2 (two) times daily.    . DULoxetine (CYMBALTA) 60 MG capsule Take 1 capsule (60 mg  total) by mouth daily. 30 capsule 0  . ferrous sulfate 325 (65 FE) MG tablet Take 1 tablet (325 mg total) by mouth 2 (two) times daily with a meal. 30 tablet 0  . folic acid (FOLVITE) 1 MG tablet Take 1 mg by mouth daily.  0  . HUMIRA PEN 40 MG/0.8ML PNKT Inject 40 mg into the skin every 14 (fourteen) days.  0  . lisinopril (PRINIVIL,ZESTRIL) 40 MG tablet Take 40 mg by mouth daily.    . methotrexate (RHEUMATREX) 2.5 MG tablet Take 7.5 mg by mouth 2 (two) times a week. Thursday and Friday  0  . predniSONE (DELTASONE) 10 MG tablet Take 10 mg by mouth daily with breakfast.    . traMADol (ULTRAM) 50 MG tablet Take 50 mg by  mouth every 6 (six) hours as needed for moderate pain or severe pain.   0  . traZODone (DESYREL) 100 MG tablet Take 1 tablet (100 mg total) by mouth at bedtime as needed for sleep. 30 tablet 0  . fluocinonide cream (LIDEX) 0.05 % Apply 1 application topically 2 (two) times daily as needed (for psorasis).    . hydrOXYzine (ATARAX/VISTARIL) 25 MG tablet Take 1 tablet (25 mg total) by mouth 2 (two) times daily as needed for anxiety. (Patient not taking: Reported on 05/25/2016) 30 tablet 0    Musculoskeletal: Strength & Muscle Tone: within normal limits Gait & Station: normal Patient leans: N/A  Psychiatric Specialty Exam: Physical Exam  ROS  Blood pressure 106/61, pulse 73, temperature 98.8 F (37.1 C), temperature source Oral, resp. rate 16, height 5\' 6"  (1.676 m), weight 47.6 kg (105 lb), SpO2 99 %.Body mass index is 16.95 kg/m.  General Appearance: Neat  Eye Contact:  Fair  Speech:  Normal Rate  Volume:  Normal  Mood:  Anxious and Depressed  Affect:  Constricted, Depressed and Labile  Thought Process:  Disorganized  Orientation:  Full (Time, Place, and Person)  Thought Content:  Rumination and Tangential  Suicidal Thoughts:  Yes.  without intent/plan  Homicidal Thoughts:  No  Memory:  Immediate;   Fair Recent;   Fair Remote;   Fair  Judgement:  Fair  Insight:   Fair  Psychomotor Activity:  Normal  Concentration:  Concentration: Fair and Attention Span: Fair  Recall:  of Knowledge:  Fair  Language:  Good  Akathisia:  Negative  Handed:  Right  AIMS (if indicated):     Assets:  Resilience  ADL's:  Intact  Cognition:  WNL  Sleep:  poor   Treatment Plan Summary: Daily contact with patient to assess and evaluate symptoms and progress in treatment, Medication management and Plan seek inpatient   Disposition: Recommend psychiatric Inpatient admission when medically cleared. Supportive therapy provided about ongoing stressors. Discussed crisis plan, support from social network, calling 911, coming to the Emergency Department, and calling Suicide Hotline.   Patient accepted to [facility]  [facility] [place], NP [facility] 05/28/2016 1:36 PM  Patient seen face-to-face for psychiatric evaluation, chart reviewed and case discussed with the physician extender and developed treatment plan. Reviewed the information documented and agree with the treatment plan. 05/30/2016, MD

## 2016-05-28 NOTE — ED Notes (Signed)
Sheriff called for transport  

## 2016-05-28 NOTE — ED Notes (Signed)
Pt behavior calm and cooperative, sad affect, endorsing depression. Denies SI/HI. Denies AVH. Pt compliant with morning medication regimen. Special checks q 15 mins in place for safety. Video monitoring in place.

## 2016-05-29 ENCOUNTER — Encounter: Payer: Self-pay | Admitting: Psychiatry

## 2016-05-29 DIAGNOSIS — M810 Age-related osteoporosis without current pathological fracture: Secondary | ICD-10-CM

## 2016-05-29 DIAGNOSIS — I10 Essential (primary) hypertension: Secondary | ICD-10-CM

## 2016-05-29 DIAGNOSIS — F332 Major depressive disorder, recurrent severe without psychotic features: Principal | ICD-10-CM

## 2016-05-29 MED ORDER — HYDROXYZINE HCL 50 MG PO TABS
50.0000 mg | ORAL_TABLET | Freq: Every evening | ORAL | Status: DC | PRN
  Administered 2016-05-29 – 2016-06-02 (×5): 50 mg via ORAL
  Filled 2016-05-29 (×5): qty 1

## 2016-05-29 MED ORDER — DOCUSATE SODIUM 100 MG PO CAPS
100.0000 mg | ORAL_CAPSULE | Freq: Two times a day (BID) | ORAL | Status: DC
  Administered 2016-05-29 – 2016-06-05 (×14): 100 mg via ORAL
  Filled 2016-05-29 (×14): qty 1

## 2016-05-29 MED ORDER — TRAMADOL HCL 50 MG PO TABS
100.0000 mg | ORAL_TABLET | Freq: Three times a day (TID) | ORAL | Status: DC
  Administered 2016-05-29 – 2016-06-05 (×27): 100 mg via ORAL
  Filled 2016-05-29 (×27): qty 2

## 2016-05-29 MED ORDER — LIDOCAINE 5 % EX PTCH
2.0000 | MEDICATED_PATCH | CUTANEOUS | Status: DC
  Administered 2016-05-29 – 2016-06-04 (×7): 2 via TRANSDERMAL
  Filled 2016-05-29 (×8): qty 2

## 2016-05-29 MED ORDER — ENSURE ENLIVE PO LIQD
237.0000 mL | Freq: Three times a day (TID) | ORAL | Status: DC
  Administered 2016-05-29 – 2016-06-04 (×17): 237 mL via ORAL

## 2016-05-29 NOTE — Progress Notes (Signed)
Recreation Therapy Notes  Date: 09.18.17 Time: 1:00 pm Location: Craft Room  Group Topic: Self-expression  Goal Area(s) Addresses:  Patient will identify one color per emotion listed on wheel. Patient will verbalize benefit of using art as a means of self-expression. Patient will verbalize one emotion experienced during session. Patient will be educated on other forms of self-expression.  Behavioral Response: Attentive, Interactive  Intervention: Emotion Wheel  Activity: Patients were given an Arboriculturist and instructed to pick a color for each emotion listed on the wheel.  Education: LRT educated patients on other forms of self-expression.  Education Outcome: Acknowledges education/In group clarification offered  Clinical Observations/Feedback: Patient completed activity by picking a color for each emotion. Patient contributed to group discussion by stating some colors she picked for certain emotions.  [name], LRT/CTRS 05/29/2016 2:38 PM

## 2016-05-29 NOTE — BHH Counselor (Signed)
CSW attempted PSA with pt but pt requested completion on following day due to meal time.  CSW agreed.  Dorothe Pea. Sahir Tolson, LCSWA, LCAS

## 2016-05-29 NOTE — Progress Notes (Signed)
Patient ID: Tara Brown, female   DOB: 1961/06/15, 55 y.o.   MRN: [id number]   CSW attempted PSA with pt but pt requested completion on following day due to meal time.  CSW agreed.  Dorothe Pea. Zanobia Griebel, LCSWA, LCAS

## 2016-05-29 NOTE — Plan of Care (Signed)
Problem: Coping: Goal: Ability to cope will improve Outcome: Not Progressing Patient with low interest and low energy at this time. Returns to bed to rest after am meal.

## 2016-05-29 NOTE — BHH Group Notes (Signed)
BHH Group Notes:  (Nursing/MHT/Case Management/Adjunct)  Date:  05/29/2016  Time:  4:59 PM  Type of Therapy:  Psychoeducational Skills  Participation Level:  None  ms:  Mickey Farber 05/29/2016, 4:59 PM

## 2016-05-29 NOTE — H&P (Signed)
Psychiatric Admission Assessment Adult  Patient Identification: Tara Brown MRN:  [id number] Date of Evaluation:  05/29/2016 Chief Complaint:  Major Depression Principal Diagnosis: MDD (major depressive disorder), recurrent severe, without psychosis (HCC) Diagnosis:   Patient Active Problem List   Diagnosis Date Noted  . Osteoporosis [M81.0] 05/29/2016  . HTN (hypertension) [I10] 05/29/2016  . MDD (major depressive disorder), recurrent severe, without psychosis (HCC) [F33.2] 05/28/2016  . Rheumatoid arthritis (HCC) [M06.9] 12/06/2015  . 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  This patient has had a multitude of psychiatric hospitalizations since 2016. She was here in June 2016 due to severe depression and suicidality. After that the patient was hospitalized at behavioral health in [place] in July, twice in November, and twice in December. She was just discharged from behavioral health [place] on September 9 after being there for 23 days.  Patient is basically stating that the deformity on both of her hands is impairing her ability to care for self. Patient says that every single activity of daily living is a significant challenge for her. She tells me that these has caused severe depression and she has been having suicidal ideation. During her past hospitalization at behavioral health in [place] a social worker there try to place her in a assisted living facility however the patient doesn't have Medicaid and after 23 days that were unable to find placement for her. The patient was discharged back home. Patient states that family has not offered her to allow her to stay with them and she does not have the means to pay for somebody to come and help her around the house.  Patient also reports that she basically  supports herself with disability and sometimes there is not enough for her to buy food for seeing doctors doctors. The only provider she has at this time is a Publishing rights manager that provides her with primary care.  Patient was seen in the emergency department at [facility] on September 14 and she continued reporting self-care deficits due to her physical limitations. While in the emergency room the social worker was consulted and they place orders for home health and Meals on Wheels but later on patient reported suicidality and they consulted psychiatry.  She reported that somebody told her that if she came to the hospital she will have a better chance to replace. She stated that she just needed help getting into an assisted living facility.  Patient is trying to request to be transferred today to calmly her brother as she said the social worker there was not handling her case and knew how to get her into an assisted living without medicaid.  Today the patient reported having fleeting suicidal thoughts but no plan. She continues to report severe depression as a result of her physical limitations. She denies homicidality or auditory or visual hallucinations.  Substance abuse denies.   Associated Signs/Symptoms: Depression Symptoms:  depressed mood, fatigue, hopelessness, (Hypo) Manic Symptoms:  denies Anxiety Symptoms:  Excessive Worry, Psychotic Symptoms:  denies PTSD Symptoms: NA Total Time spent with patient: 1 hour  Past Psychiatric History: Multiple psychiatric hospitalizations [place] since the summer of 2016. Carries a diagnosis of major depressive disorder. Recently discharged from [facility] behavioral health after a suicidal attempt by taking 30 Ambien's.  Is the patient at risk to self? Yes.    Has the patient been a  risk to self in the past 6 months? Yes.    Has the patient been a risk to self within the distant past? No.  Is the patient a risk to others? No.  Has the patient  been a risk to others in the past 6 months? No.  Has the patient been a risk to others within the distant past? No.    Alcohol Screening: 1. How often do you have a drink containing alcohol?: Never 2. How many drinks containing alcohol do you have on a typical day when you are drinking?: 1 or 2 3. How often do you have six or more drinks on one occasion?: Never Preliminary Score: 0 9. Have you or someone else been injured as a result of your drinking?: No 10. Has a relative or friend or a doctor or another health worker been concerned about your drinking or suggested you cut down?: No Alcohol Use Disorder Identification Test Final Score (AUDIT): 0 Brief Intervention: AUDIT score less than 7 or less-screening does not suggest unhealthy drinking-brief intervention not indicated  Past Medical History:  Past Medical History:  Diagnosis Date  . Anemia   . Arthritis   . 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 Osteoporosis     Past Surgical History:  Procedure Laterality Date  . AMPUTATION Left 12/07/2015   Procedure: AMPUTATION LEFT GREAT TOE;  Surgeon: Nadara Mustard, MD;  Location: [facility];  Service: Orthopedics;  Laterality: Left;  . FOOT SURGERY    . HAND SURGERY    . HIP FRACTURE SURGERY    . KNEE SURGERY    . NECK SURGERY     Family History:  Family History  Problem Relation Age of Onset  . Bipolar disorder Sister   . Suicidality Other   . Schizophrenia Other   . Stroke Neg Hx    Family Psychiatric  History: Mother deceased, father alive , has younger sisters, states father may be alcoholic, does not endorse mental illness in family, but a nephew committed suicide .Nephew was diagnosed with schizophrenia. Sister may have bipolar disorder.   Tobacco Screening: Have you used any form of tobacco in the last 30 days? (Cigarettes, Smokeless Tobacco, Cigars, and/or Pipes): No   Social History: Patient is divorced, has no children,  is on disability, lives alone . No legal issues. As noted, major stressor at this time is worsening ability to function independently due to progression of her rheumatologic disease . History  Alcohol Use No     History  Drug Use No     Allergies:  No Known Allergies   Lab Results: No results found for this or any previous visit (from the past 48 hour(s)).  Blood Alcohol level:  Lab Results  Component Value Date   ETH <5 05/25/2016   ETH <5 04/23/2016    Metabolic Disorder Labs:  No results found for: HGBA1C, MPG No results found for: PROLACTIN No results found for: CHOL, TRIG, HDL, CHOLHDL, VLDL, LDLCALC  Current Medications: Current Facility-Administered Medications  Medication Dose Route Frequency Provider Last Rate Last Dose  . acetaminophen (TYLENOL) tablet 650 mg  650 mg Oral Q6H PRN Shuvon B Rankin, NP      . alum & mag hydroxide-simeth (MAALOX/MYLANTA) 200-200-20 MG/5ML suspension 30 mL  30 mL Oral Q4H PRN Shuvon B Rankin, NP      . calcium-vitamin D (OSCAL WITH D) 500-200 MG-UNIT per tablet 1 tablet  1 tablet Oral BID Shuvon B Rankin,  NP   1 tablet at 05/29/16 0850  . docusate sodium (COLACE) capsule 100 mg  100 mg Oral BID Jimmy Footman, MD      . DULoxetine (CYMBALTA) DR capsule 60 mg  60 mg Oral Daily Shuvon B Rankin, NP   60 mg at 05/29/16 0850  . feeding supplement (ENSURE ENLIVE) (ENSURE ENLIVE) liquid 237 mL  237 mL Oral TID BM Jimmy Footman, MD      . ferrous sulfate tablet 325 mg  325 mg Oral BID WC Shuvon B Rankin, NP   325 mg at 05/29/16 0849  . fluocinonide cream (LIDEX) 0.05 % 1 application  1 application Topical BID PRN Shuvon B Rankin, NP      . folic acid (FOLVITE) tablet 1 mg  1 mg Oral Daily Shuvon B Rankin, NP   1 mg at 05/29/16 0850  . hydrOXYzine (ATARAX/VISTARIL) tablet 50 mg  50 mg Oral QHS PRN Jimmy Footman, MD      . Influenza vac split quadrivalent PF (FLUARIX) injection 0.5 mL  0.5 mL Intramuscular  Tomorrow-1000 John T Clapacs, MD      . lidocaine (LIDODERM) 5 % 2 patch  2 patch Transdermal Q24H Jimmy Footman, MD      . lisinopril (PRINIVIL,ZESTRIL) tablet 40 mg  40 mg Oral Daily Shuvon B Rankin, NP   40 mg at 05/29/16 0849  . magnesium hydroxide (MILK OF MAGNESIA) suspension 30 mL  30 mL Oral Daily PRN Shuvon B Rankin, NP      . [START ON 06/01/2016] methotrexate (RHEUMATREX) tablet 7.5 mg  7.5 mg Oral Once per day on Thu Fri Shuvon B Rankin, NP      . predniSONE (DELTASONE) tablet 10 mg  10 mg Oral Q breakfast Shuvon B Rankin, NP   10 mg at 05/29/16 0851  . traMADol (ULTRAM) tablet 100 mg  100 mg Oral TID WC & HS Jimmy Footman, MD       PTA Medications: Prescriptions Prior to Admission  Medication Sig Dispense Refill Last Dose  . calcium-vitamin D (OSCAL WITH D) 500-200 MG-UNIT tablet Take 1 tablet by mouth 2 (two) times daily.   Past Month at Unknown time  . DULoxetine (CYMBALTA) 60 MG capsule Take 1 capsule (60 mg total) by mouth daily. 30 capsule 0 Past Month at Unknown time  . ferrous sulfate 325 (65 FE) MG tablet Take 1 tablet (325 mg total) by mouth 2 (two) times daily with a meal. 30 tablet 0 Past Month at Unknown time  . fluocinonide cream (LIDEX) 0.05 % Apply 1 application topically 2 (two) times daily as needed (for psorasis).   unknown  . folic acid (FOLVITE) 1 MG tablet Take 1 mg by mouth daily.  0 Past Month at Unknown time  . HUMIRA PEN 40 MG/0.8ML PNKT Inject 40 mg into the skin every 14 (fourteen) days.  0 Past Month at Unknown time  . hydrOXYzine (ATARAX/VISTARIL) 25 MG tablet Take 1 tablet (25 mg total) by mouth 2 (two) times daily as needed for anxiety. (Patient not taking: Reported on 05/25/2016) 30 tablet 0 Not Taking at Unknown time  . lisinopril (PRINIVIL,ZESTRIL) 40 MG tablet Take 40 mg by mouth daily.   Past Month at Unknown time  . methotrexate (RHEUMATREX) 2.5 MG tablet Take 7.5 mg by mouth 2 (two) times a week. Thursday and Friday  0 Past  Month at unknown time  . predniSONE (DELTASONE) 10 MG tablet Take 10 mg by mouth daily with breakfast.   Past  Month at Unknown time  . traMADol (ULTRAM) 50 MG tablet Take 50 mg by mouth every 6 (six) hours as needed for moderate pain or severe pain.   0 Past Month at Unknown time  . traZODone (DESYREL) 100 MG tablet Take 1 tablet (100 mg total) by mouth at bedtime as needed for sleep. 30 tablet 0 Past Month at Unknown time    Musculoskeletal: Strength & Muscle Tone: within normal limits Gait & Station: normal Patient leans: N/A  Psychiatric Specialty Exam: Physical Exam  Constitutional: She is oriented to person, place, and time. She appears well-developed.  HENT:  Head: Normocephalic and atraumatic.  Eyes: EOM are normal.  Neck: Normal range of motion.  Respiratory: Effort normal.  Musculoskeletal: She exhibits deformity.  Neurological: She is alert and oriented to person, place, and time.    Review of Systems  Constitutional: Negative.   HENT: Negative.   Eyes: Negative.   Respiratory: Negative.   Cardiovascular: Negative.   Gastrointestinal: Negative.   Genitourinary: Negative.   Musculoskeletal: Positive for back pain and joint pain.  Skin: Negative.   Neurological: Negative.   Endo/Heme/Allergies: Negative.   Psychiatric/Behavioral: Positive for depression and suicidal ideas. Negative for hallucinations, memory loss and substance abuse. The patient is nervous/anxious and has insomnia.     Blood pressure 108/69, pulse 68, temperature 98.5 F (36.9 C), temperature source Oral, resp. rate 18, height 5\' 6"  (1.676 m), weight 49.9 kg (110 lb), SpO2 98 %.Body mass index is 17.75 kg/m.  General Appearance: Well Groomed  Eye Contact:  Good  Speech:  Clear and Coherent  Volume:  Normal  Mood:  Dysphoric  Affect:  Appropriate  Thought Process:  Linear and Descriptions of Associations: Intact  Orientation:  Full (Time, Place, and Person)  Thought Content:  Hallucinations: None   Suicidal Thoughts:  No  Homicidal Thoughts:  No  Memory:  Immediate;   Good Recent;   Good Remote;   Good  Judgement:  Fair  Insight:  Fair  Psychomotor Activity:  Normal  Concentration:  Concentration: Good and Attention Span: Good  Recall:  Good  Fund of Knowledge:  Good  Language:  Good  Akathisia:  No  Handed:    AIMS (if indicated):     Assets:  Communication Skills Housing  ADL's:  Intact  Cognition:  WNL  Sleep:  Number of Hours: 5.25    Treatment Plan Summary:  Major depressive disorder: Continue Cymbalta 60 mg by mouth daily  Insomnia I will start the patient on Vistaril 50 mg by mouth daily at bedtime when necessary for insomnia  Chronic pain I will order Lidoderm patches and she will be continued on tramadol. Patient states that she has been taking 100 mg by mouth 4 times a day. I will check the [place] controlled substance database  Hypertension continue lisinopril. I will order low sodium diet  Arthritis continue prednisone and methotrexate. Patient is on folic acid daily  Osteoporosis continue calcium and vitamin D  Constipation continue Colace 100 mg by mouth twice a day  Dispo: I will discuss the case with the social worker believes that there have been multiple failures in trying to place her into an assisted living facility and not sure that we will be able to do anything different  Follow-up patient will continue to follow-up with her primary care. As she is states she does not have the financial means to afford to see two physicians.  Patient states that she has tried Meals on  Wheels but the food was terrible and she did not like it. Patient does not have the means to pay for home help and her insurance does not cover it. Family has not offered to help.  I certify that inpatient services furnished can reasonably be expected to improve the patient's condition.    Jimmy Footman, MD 9/18/20173:06 PM

## 2016-05-29 NOTE — Progress Notes (Signed)
D: Pt affect is depressed this evening. She reports that she came to the hospital in order to get help finding placement in an assisted living facility. Pt reports increased depression d/t her physical health. She denies SI/HI/AVH at this time. A: Emotional support and encouragement provided. Medications administered with education. q15 minute safety checks maintained. R: Pt remains free from harm. Will continue to monitor.

## 2016-05-29 NOTE — Progress Notes (Signed)
Patient with depressed affect, withdrawn behavior. Patient with low interest and low energy this am. Returns to bed to rest after am meal. Reports fair effect from prn pain med this morning. Able to return to bed to rest, sleep. Cooperative with am meds. Requests assist and supervision for shower. Shower chair in place. Staff to assist and supervise when available. No SI/HI at this time. Verbalizes needs appropriately when staff initiates interaction. Minimal interaction with peers. Safety maintained.

## 2016-05-29 NOTE — Progress Notes (Signed)
Recreation Therapy Notes  INPATIENT RECREATION THERAPY ASSESSMENT  Patient Details Name: Tara Brown MRN: [id number] DOB: 19-Aug-1961 Today's Date: 05/29/2016  Patient Stressors: Family, Death, Friends, Other (Comment) (Family wants to go to an Assisted Living Facility - she does not want to go; nephew committed suicide 2 years ago; lack of supportive friends; frustrations with physical limitations)  Coping Skills:   Isolate, Avoidance, Art/Dance, Music, Other (Comment) (Play with cat)  Personal Challenges: Concentration, Decision-Making, Expressing Yourself, Problem-Solving, Relationships, Self-Esteem/Confidence, Social Interaction, Stress Management, Trusting Others  Leisure Interests (2+):  Individual - Other (Comment) (Watch movies, crafts )  Awareness of Community Resources:  No  Community Resources:     Current Use:    If no, Barriers?:    Patient Strengths:  Creative, Funny  Patient Identified Areas of Improvement:  Health - take care of herself by herself  Current Recreation Participation:  Watching TV  Patient Goal for Hospitalization:  To start feeling better and find a place to go  [place] of Residence:  [facility] of Residence:  [place]   Current [place] (including self-harm):  No  Current HI:  No  Consent to Intern Participation: N/A   [name], LRT/CTRS 05/29/2016, 3:36 PM

## 2016-05-29 NOTE — B[facility] Group Notes (Signed)
[facility] Group Notes:  (Nursing/MHT/Case Management/Adjunct)  Date:  05/29/2016  Time:  12:52 AM  Type of Therapy:  Psychoeducational Skills  Participation Level:  Active  Participation Quality:  Appropriate  Affect:  Appropriate  Cognitive:  Alert  Insight:  Good  Engagement in Group:  Engaged  Modes of Intervention:  Support  Summary of Progress/Problems:  Tara Brown 05/29/2016, 12:52 AM
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
## 2016-05-29 NOTE — [facility] Group Notes (Signed)
B[facility] LCSW Group Therapy   05/29/2016 9:30am Type of Therapy: Group Therapy   Participation Level: Pt invited but did not attend.   Participation Quality: Pt invited but did not attend.    Ledora Delker, MSW, LCSWA     

## 2016-05-30 MED ORDER — TUBERCULIN PPD 5 UNIT/0.1ML ID SOLN
5.0000 [IU] | Freq: Once | INTRADERMAL | Status: AC
  Administered 2016-05-30: 5 [IU] via INTRADERMAL
  Filled 2016-05-30: qty 0.1

## 2016-05-30 NOTE — Progress Notes (Signed)
Pt awake, alert on unit today. Attends groups and participates, interacting appropriately with peers/staff. Isolative to room unless in group or eating meal. Reports depression 7/10, hopelessness 7/10 and anxiety 8/10 (low 0 - 10 high). Reports goal of discharge planning by talking to social work and doctor to find placement. Denies SI/HI/AVH.   Safety maintained with every 15 minute checks. Medication administered as ordered. Pt mediation compliant. Support and encouragement provided. Will continue to monitor.

## 2016-05-30 NOTE — Progress Notes (Signed)
[facility] MD Progress Note  05/30/2016 3:47 PM Tara Brown  MRN:  [id number] Subjective:   Patient was sure with me during interaction this morning. Patient becomes tearful when we this causes the barriers that social workers have had with placing her as she does not have Medicaid. Patient "if I have to go back, I'm in big trouble". She continues to report passive suicidal ideation. Continues to describe depressed mood and hopelessness. States that last night she didn't sleep well as her sleeping medicine was changed. She was on trazodone prior to admission however she has been taking 400 mg of tramadol for pain and she is also on Cymbalta. I educated her about the risk of serotonin syndrome by having all these 3 medications. She will be continued on Vistaril 50 mg daily at bedtime for insomnia  Per nursing: Pt awake, alert on unit today. Attends groups and participates, interacting appropriately with peers/staff. Isolative to room unless in group or eating meal. Reports depression 7/10, hopelessness 7/10 and anxiety 8/10 (low 0 - 10 high). Reports goal of discharge planning by talking to social work and doctor to find placement. Denies SI/HI/AVH.    Principal Problem: MDD (major depressive disorder), recurrent severe, without psychosis (HCC) Diagnosis:   Patient Active Problem List   Diagnosis Date Noted  . Osteoporosis [M81.0] 05/29/2016  . HTN (hypertension) [I10] 05/29/2016  . MDD (major depressive disorder), recurrent severe, without psychosis (HCC) [F33.2] 05/28/2016  . Rheumatoid arthritis (HCC) [M06.9] 12/06/2015  . Anemia of chronic disease [D63.8] 03/08/2015   Total Time spent with patient: 30 minutes  Past Psychiatric History:   Past Medical History:  Past Medical History:  Diagnosis Date  . Anemia   . Arthritis   . 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 Osteoporosis     Past Surgical History:  Procedure Laterality Date   . AMPUTATION Left 12/07/2015   Procedure: AMPUTATION LEFT GREAT TOE;  Surgeon: Nadara Mustard, MD;  Location: [facility];  Service: Orthopedics;  Laterality: Left;  . FOOT SURGERY    . HAND SURGERY    . HIP FRACTURE SURGERY    . KNEE SURGERY    . NECK SURGERY     Family History:  Family History  Problem Relation Age of Onset  . Bipolar disorder Sister   . Suicidality Other   . Schizophrenia Other   . Stroke Neg Hx    Family Psychiatric  History:  Social History:  History  Alcohol Use No     History  Drug Use No    Social History   Social History  . Marital status: Divorced    Spouse name: N/A  . Number of children: N/A  . Years of education: N/A   Social History Main Topics  . Smoking status: Never Smoker  . Smokeless tobacco: Never Used  . Alcohol use No  . Drug use: No  . Sexual activity: No   Other Topics Concern  . None   Social History Narrative  . None    Current Medications: Current Facility-Administered Medications  Medication Dose Route Frequency Provider Last Rate Last Dose  . acetaminophen (TYLENOL) tablet 650 mg  650 mg Oral Q6H PRN Shuvon B Rankin, NP      . alum & mag hydroxide-simeth (MAALOX/MYLANTA) 200-200-20 MG/5ML suspension 30 mL  30 mL Oral Q4H PRN Shuvon B Rankin, NP      . calcium-vitamin D (OSCAL WITH D) 500-200 MG-UNIT per tablet 1 tablet  1  tablet Oral BID Shuvon B Rankin, NP   1 tablet at 05/30/16 0759  . docusate sodium (COLACE) capsule 100 mg  100 mg Oral BID Jimmy Footman, MD   100 mg at 05/30/16 0759  . DULoxetine (CYMBALTA) DR capsule 60 mg  60 mg Oral Daily Shuvon B Rankin, NP   60 mg at 05/30/16 0801  . feeding supplement (ENSURE ENLIVE) (ENSURE ENLIVE) liquid 237 mL  237 mL Oral TID BM Jimmy Footman, MD   237 mL at 05/30/16 1138  . ferrous sulfate tablet 325 mg  325 mg Oral BID WC Shuvon B Rankin, NP   325 mg at 05/30/16 0759  . fluocinonide cream (LIDEX) 0.05 % 1 application  1 application Topical BID PRN  Shuvon B Rankin, NP      . folic acid (FOLVITE) tablet 1 mg  1 mg Oral Daily Shuvon B Rankin, NP   1 mg at 05/30/16 0800  . hydrOXYzine (ATARAX/VISTARIL) tablet 50 mg  50 mg Oral QHS PRN Jimmy Footman, MD   50 mg at 05/29/16 2118  . lidocaine (LIDODERM) 5 % 2 patch  2 patch Transdermal Q24H Jimmy Footman, MD   2 patch at 05/29/16 2119  . lisinopril (PRINIVIL,ZESTRIL) tablet 40 mg  40 mg Oral Daily Shuvon B Rankin, NP   40 mg at 05/30/16 0800  . magnesium hydroxide (MILK OF MAGNESIA) suspension 30 mL  30 mL Oral Daily PRN Shuvon B Rankin, NP      . [START ON 06/01/2016] methotrexate (RHEUMATREX) tablet 7.5 mg  7.5 mg Oral Once per day on Thu Fri Shuvon B Rankin, NP      . predniSONE (DELTASONE) tablet 10 mg  10 mg Oral Q breakfast Shuvon B Rankin, NP   10 mg at 05/30/16 0759  . traMADol (ULTRAM) tablet 100 mg  100 mg Oral TID WC & HS Jimmy Footman, MD   100 mg at 05/30/16 1147    Lab Results: No results found for this or any previous visit (from the past 48 hour(s)).  Blood Alcohol level:  Lab Results  Component Value Date   ETH <5 05/25/2016   ETH <5 04/23/2016    Metabolic Disorder Labs: No results found for: HGBA1C, MPG No results found for: PROLACTIN No results found for: CHOL, TRIG, HDL, CHOLHDL, VLDL, LDLCALC  Physical Findings: AIMS:  , ,  ,  ,    CIWA:    COWS:     Musculoskeletal: Strength & Muscle Tone: within normal limits Gait & Station: normal Patient leans: N/A  Psychiatric Specialty Exam: Physical Exam  Constitutional: She is oriented to person, place, and time. She appears well-developed and well-nourished.  HENT:  Head: Normocephalic and atraumatic.  Eyes: Conjunctivae and EOM are normal.  Neck: Normal range of motion.  Respiratory: Effort normal.  Musculoskeletal: Normal range of motion. She exhibits deformity.  Neurological: She is alert and oriented to person, place, and time.    ROS  Blood pressure 116/75, pulse 74,  temperature 98.1 F (36.7 C), temperature source Oral, resp. rate 18, height 5\' 6"  (1.676 m), weight 49.9 kg (110 lb), SpO2 97 %.Body mass index is 17.75 kg/m.  General Appearance: Well Groomed  Eye Contact:  Good  Speech:  Clear and Coherent  Volume:  Normal  Mood:  Irritable  Affect:  Appropriate  Thought Process:  Linear and Descriptions of Associations: Intact  Orientation:  Full (Time, Place, and Person)  Thought Content:  Hallucinations: None  Suicidal Thoughts:  No  Homicidal  Thoughts:  No  Memory:  Immediate;   Good Recent;   Good Remote;   Good  Judgement:  Fair  Insight:  Shallow  Psychomotor Activity:  Normal  Concentration:  Concentration: Good and Attention Span: Good  Recall:  Good  Fund of Knowledge:  Good  Language:  Good  Akathisia:  No  Handed:    AIMS (if indicated):     Assets:  Architect Housing  ADL's:  Intact  Cognition:  WNL  Sleep:  Number of Hours: 5.25     Treatment Plan Summary:  Major depressive disorder: Continue Cymbalta 60 mg by mouth daily  Insomnia: continue Vistaril 50 mg by mouth daily at bedtime when necessary for insomnia  Chronic pain: continue  Lidoderm patches and she will be continued on tramadol. Patient states that she has been taking 100 mg by mouth 4 times a day.  [place] controlled substance database confirms her dose.  Hypertension continue lisinopril. I will order low sodium diet  Arthritis continue prednisone and methotrexate. Patient is on folic acid daily  Osteoporosis continue calcium and vitamin D  Constipation continue Colace 100 mg by mouth twice a day  Dispo: I will discuss the case with the social worker believes that there have been multiple failures in trying to place her into an assisted living facility and not sure that we will be able to do anything different.  We obtain a collateral information from social workers from [facility] was stated that  the patient actually declined possible placement because the facility was not willing to allow her to move in with her cat. Our social worker will try to contact the same facility as apparently they have accepted her with a letter of guarantee (letter guarantees that she has applied for Medicaid and is pending). The social workers from [place] stated that the patient has been approved for Medicaid but the Medicaid is still pending.  Follow-up patient will continue to follow-up with her primary care. As she is states she does not have the financial means to afford to see two physicians.  Patient states that she has tried Meals on Wheels but the food was terrible and she did not like it. Patient does not have the means to pay for home help and her insurance does not cover it. Family has not offered to help.  Plan to discharge in the next 48 hours  Jimmy Footman, MD 05/30/2016, 3:47 PM

## 2016-05-30 NOTE — [facility] Group Notes (Signed)
[facility] Group Notes:  (Nursing/MHT/Case Management/Adjunct)  Date:  05/30/2016  Time:  3:53 AM  Type of Therapy:  Psychoeducational Skills  Participation Level:  Active  Participation Quality:  Appropriate  Affect:  Appropriate  Cognitive:  Alert  Insight:  Good  Engagement in Group:  Engaged  Modes of Intervention:  Activity  Summary of Progress/Problems:  Tara Brown 05/30/2016, 3:53 AM

## 2016-05-30 NOTE — [facility] Group Notes (Signed)
[facility] LCSW Group Therapy   05/30/2016 9:30am  Type of Therapy: Group Therapy   Participation Level: Active   Participation Quality: Attentive, Sharing and Supportive   Affect: Appropriate  Cognitive: Alert and Oriented   Insight: Developing/Improving and Engaged   Engagement in Therapy: Developing/Improving and Engaged   Modes of Intervention: Clarification, Confrontation, Discussion, Education, Exploration,  Limit-setting, Orientation, Problem-solving, Rapport Building, Dance movement psychotherapist, Socialization and Support  Summary of Progress/Problems: The topic for group therapy was feelings about diagnosis. Pt actively participated in group discussion on their past and current diagnosis and how they feel towards this. Pt also identified how society and family members judge them, based on their diagnosis as well as stereotypes and stigmas. Pt identified diagnosis as depression. Her feelings around her diagnosis are feelings of worthlessness and a cognitive distortion of "not being good enough". Pt believes that if her social support is increased her diagnosis will improve along with her feelings around her diagnosis.     Hampton Abbot, MSW, [facility]

## 2016-05-30 NOTE — [facility] Counselor (Deleted)
Adult Comprehensive Assessment  Patient ID: Tara Brown, female   DOB: Jan 08, 1961, 55 y.o.   MRN: [id number]  Information Source: Information source: Patient  Current Stressors:     Living/Environment/Situation:  Living Arrangements: Alone  Family History:     Childhood History:     Education:     Employment/Work Situation:      Surveyor, quantity Resources:      Alcohol/Substance Abuse:      Social Support System:      Leisure/Recreation:      Strengths/Needs:      Discharge Plan:      Summary/Recommendations:   Summary and Recommendations (to be completed by the evaluator): Pt presented to the hospital after a fall and due to be unable to perform activities of daily living. Pt reports primary trigger(s) for admission worries over her inability to care for herself physically. Pt reports she has no community supports.  Stressors include social isolation and physical infirmaties that have caused the pt to become disabled.  Patient will benefit from crisis stabilization, medication evaluation, group therapy and psycho education in addition to case management for discharge planning. At discharge it is recommended that Pt remain compliant with established discharge plan and continued treatment.  Tara Brown. 05/30/2016

## 2016-05-30 NOTE — Progress Notes (Signed)
TB skin test administered to R FA  -area marked. To be read 06/01/2016 1750.

## 2016-05-30 NOTE — Progress Notes (Signed)
Recreation Therapy Notes  Date: 09.19.17 Time: 3:00 pm Location: Craft Room  Group Topic: Coping Skills  Goal Area(s) Addresses:  Patient will verbalize emotions related to recovery. Patient will write at least one healthy coping skill.  Behavioral Response: Attentive  Intervention: Retail buyer  Activity: Patients were given Retail buyer worksheets and instructed to as a group think of 8 emotions they experience in their recovery. For each emotion, patients were to think of coping skills to overcome the emotion or coping skills to help them feel the emotion.  Education: LRT educated patients on coping skills.  Education Outcome: Acknowledges education/In group clarification offered   Clinical Observations/Feedback: Patient completed activity by writing 8 emotions towards recovery and copings skills for each emotion. Patient did not contribute to group discussion.  [name], LRT/CTRS 05/30/2016 4:23 PM

## 2016-05-30 NOTE — Progress Notes (Signed)
D: Pt is observed in the milieu this evening interacting appropriately with staff and peers. She reports that she is looking forward to meeting with the social worker tomorrow to try and find placement in assisted living. Pt rates depression 7/10 and anxiety 5/10. Denies SI/HI/AVH at this time. A: Emotional support and encouragement provided. Medications administered as prescribed. q15 minute safety checks maintained. R: Pt remains free from harm. Will continue to monitor.

## 2016-05-30 NOTE — Plan of Care (Signed)
Problem: [facility] Participation in Recreation Therapeutic Interventions Goal: STG-Patient will demonstrate improved self esteem by identif STG: Self-Esteem - Within 4 treatment sessions, patient will verbalize at least 5 positive affirmation statements in each of 2 treatment sessions to increase self-esteem post d/c.  Outcome: Progressing Treatment Session 1; Completed 1 out of 2: At approximately 1:30 pm, LRT met with patient in patient room. Patient verbalized 5 positive affirmation statements. Patient reported it felt "good". LRT encouraged patient to continue saying positive affirmation statements.   Leonette Monarch, LRT/CTRS 09.19.17 2:34 pm Goal: STG-Other Recreation Therapy Goal (Specify) STG: Stress Management - Within 4 treatment sessions, patient will verbalize understanding of the stress management techniques in each of 2 treatment sessions to increase stress management skills post d/c.  Outcome: Progressing Treatment Session 1; Completed 1 out of 2: At approximately 1:30 pm, LRT met with patient in patient room. LRT educated and provided patient with handouts on stress management techniques. Patient verbalized understanding. LRT encouraged patient to read over and practice the stress management techniques.  Leonette Monarch, LRT/CTRS 09.19.17 2:35 pm

## 2016-05-30 NOTE — Plan of Care (Signed)
Problem: Self-Concept: Goal: Level of anxiety will decrease Outcome: Not Progressing Pt reports anxiety 7/10 today.Reported 5/10 yesterday. Support and encouragement provided. Will continue to monitor.

## 2016-05-30 NOTE — Plan of Care (Signed)
Problem: Safety: Goal: Ability to disclose and discuss suicidal ideas will improve Outcome: Progressing Pt rates depression 7/10. She denies SI at this time.  Problem: Health Behavior/Discharge Planning: Goal: Compliance with treatment plan for underlying cause of condition will improve Outcome: Progressing Pt attends wrap up group this evening. She takes medications as prescribed.

## 2016-05-31 ENCOUNTER — Encounter: Payer: Self-pay | Admitting: Psychiatry

## 2016-05-31 NOTE — [facility] Group Notes (Signed)
[facility] Group Notes:  (Nursing/MHT/Case Management/Adjunct)  Date:  05/31/2016  Time:  4:27 PM  Type of Therapy:  Psychoeducational Skills  Participation Level:  Did Not Attend  Lynelle Smoke [facility] 05/31/2016, 4:27 PM

## 2016-05-31 NOTE — Plan of Care (Signed)
Problem: Activity: Goal: Interest or engagement in activities will improve Outcome: Progressing Pt attends groups on the unit. She attends to ADLs and reports when she needs assistance appropriately.  Problem: Safety: Goal: Periods of time without injury will increase Outcome: Progressing Pt remains free from harm.

## 2016-05-31 NOTE — [facility] Group Notes (Signed)
[facility] Group Notes:  (Nursing/MHT/Case Management/Adjunct)  Date:  05/31/2016  Time:  2:59 AM  Type of Therapy:  Group Therapy  Participation Level:  Did Not Attend    Tara Brown 05/31/2016, 2:59 AM

## 2016-05-31 NOTE — Progress Notes (Addendum)
[facility] MD Progress Note  05/31/2016 6:09 PM Tara Brown  MRN:  [id number] Subjective:   Patient reports that her mood is a stable. She denies suicidality, homicidality or having auditory or visual hallucinations. She said her appetite is good, describes her energy as low. She says that she has not been sleeping well since I changed her sleeping medication. Prior to coming in the patient was on trazodone. I discontinued this medication as she is on Cymbalta and also the highest dose of tramadol. I have explain to her that she is at high risk for serotonin syndrome and therefore to trazodone was discontinued.  Patient is focused on placement. She continues to request a facility that could accept her cat.  Social worker informs me that he has made referrals to several facilities but the facilities that are interested in taking her are not going to allow pets.  Patient has been withdrawn to her room and at the same time she is being reporting difficulties with insomnia. She has been encouraged to start attending groups and to not take naps or spending too much time in her room during the daytime.  I have discussed with the patient and we are gonna try to do the best we can with placement but that we are planning at this point in time to discharge her on Friday.  Per nursing:  Pt awake and alert, calm and cooperative this morning. Brightens on approach. Isolative to room, did not attend groups. Requested Actor assisted pt with showering by washing her hair and helping her to dress. Denies SI/HI/AVH. Awaiting placement with assisted living. Reports pain 5-7/10, and refuses anything more for pain at this time.  Support and encouragement provided. Safety maintained with every 15 minute checks. Medications administered. Will continue to monitor.    Principal Problem: MDD (major depressive disorder), recurrent severe, without psychosis (HCC) Diagnosis:   Patient Active Problem List    Diagnosis Date Noted  . Osteoporosis [M81.0] 05/29/2016  . HTN (hypertension) [I10] 05/29/2016  . MDD (major depressive disorder), recurrent severe, without psychosis (HCC) [F33.2] 05/28/2016  . Rheumatoid arthritis (HCC) [M06.9] 12/06/2015  . Anemia of chronic disease [D63.8] 03/08/2015   Total Time spent with patient: 30 minutes  Past Psychiatric History:   Past Medical History:  Past Medical History:  Diagnosis Date  . Anemia   . Arthritis   . 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 Osteoporosis     Past Surgical History:  Procedure Laterality Date  . AMPUTATION Left 12/07/2015   Procedure: AMPUTATION LEFT GREAT TOE;  Surgeon: Nadara Mustard, MD;  Location: [facility];  Service: Orthopedics;  Laterality: Left;  . FOOT SURGERY    . HAND SURGERY    . HIP FRACTURE SURGERY    . KNEE SURGERY    . NECK SURGERY     Family History:  Family History  Problem Relation Age of Onset  . Bipolar disorder Sister   . Suicidality Other   . Schizophrenia Other   . Stroke Neg Hx    Family Psychiatric  History:  Social History:  History  Alcohol Use No     History  Drug Use No    Social History   Social History  . Marital status: Divorced    Spouse name: N/A  . Number of children: N/A  . Years of education: N/A   Social History Main Topics  . Smoking status: Never Smoker  . Smokeless tobacco: Never Used  .  Alcohol use No  . Drug use: No  . Sexual activity: No   Other Topics Concern  . None   Social History Narrative  . None    Current Medications: Current Facility-Administered Medications  Medication Dose Route Frequency Provider Last Rate Last Dose  . acetaminophen (TYLENOL) tablet 650 mg  650 mg Oral Q6H PRN Shuvon B Rankin, NP      . alum & mag hydroxide-simeth (MAALOX/MYLANTA) 200-200-20 MG/5ML suspension 30 mL  30 mL Oral Q4H PRN Shuvon B Rankin, NP      . calcium-vitamin D (OSCAL WITH D) 500-200 MG-UNIT per  tablet 1 tablet  1 tablet Oral BID Shuvon B Rankin, NP   1 tablet at 05/31/16 1624  . docusate sodium (COLACE) capsule 100 mg  100 mg Oral BID Jimmy Footman, MD   100 mg at 05/31/16 1624  . DULoxetine (CYMBALTA) DR capsule 60 mg  60 mg Oral Daily Shuvon B Rankin, NP   60 mg at 05/31/16 0808  . feeding supplement (ENSURE ENLIVE) (ENSURE ENLIVE) liquid 237 mL  237 mL Oral TID BM Jimmy Footman, MD   237 mL at 05/30/16 2037  . ferrous sulfate tablet 325 mg  325 mg Oral BID WC Shuvon B Rankin, NP   325 mg at 05/31/16 1624  . fluocinonide cream (LIDEX) 0.05 % 1 application  1 application Topical BID PRN Shuvon B Rankin, NP      . folic acid (FOLVITE) tablet 1 mg  1 mg Oral Daily Shuvon B Rankin, NP   1 mg at 05/31/16 0807  . hydrOXYzine (ATARAX/VISTARIL) tablet 50 mg  50 mg Oral QHS PRN Jimmy Footman, MD   50 mg at 05/30/16 2132  . lidocaine (LIDODERM) 5 % 2 patch  2 patch Transdermal Q24H Jimmy Footman, MD   2 patch at 05/31/16 1624  . lisinopril (PRINIVIL,ZESTRIL) tablet 40 mg  40 mg Oral Daily Shuvon B Rankin, NP   40 mg at 05/31/16 0807  . magnesium hydroxide (MILK OF MAGNESIA) suspension 30 mL  30 mL Oral Daily PRN Shuvon B Rankin, NP      . [START ON 06/01/2016] methotrexate (RHEUMATREX) tablet 7.5 mg  7.5 mg Oral Once per day on Thu Fri Shuvon B Rankin, NP      . predniSONE (DELTASONE) tablet 10 mg  10 mg Oral Q breakfast Shuvon B Rankin, NP   10 mg at 05/31/16 0807  . traMADol (ULTRAM) tablet 100 mg  100 mg Oral TID WC & HS Jimmy Footman, MD   100 mg at 05/31/16 1624  . tuberculin injection 5 Units  5 Units Intradermal Once Jimmy Footman, MD   5 Units at 05/30/16 1752    Lab Results: No results found for this or any previous visit (from the past 48 hour(s)).  Blood Alcohol level:  Lab Results  Component Value Date   ETH <5 05/25/2016   ETH <5 04/23/2016    Metabolic Disorder Labs: No results found for: HGBA1C, MPG No  results found for: PROLACTIN No results found for: CHOL, TRIG, HDL, CHOLHDL, VLDL, LDLCALC  Physical Findings: AIMS:  , ,  ,  ,    CIWA:    COWS:     Musculoskeletal: Strength & Muscle Tone: within normal limits Gait & Station: normal Patient leans: N/A  Psychiatric Specialty Exam: Physical Exam  Constitutional: She is oriented to person, place, and time. She appears well-developed and well-nourished.  HENT:  Head: Normocephalic and atraumatic.  Eyes: Conjunctivae and EOM are  normal.  Neck: Normal range of motion.  Respiratory: Effort normal.  Musculoskeletal: Normal range of motion. She exhibits deformity.  Neurological: She is alert and oriented to person, place, and time.    ROS   Blood pressure 107/69, pulse 68, temperature 98.7 F (37.1 C), temperature source Oral, resp. rate 18, height 5\' 6"  (1.676 m), weight 49.9 kg (110 lb), SpO2 97 %.Body mass index is 17.75 kg/m.  General Appearance: Well Groomed  Eye Contact:  Good  Speech:  Clear and Coherent  Volume:  Normal  Mood:  Irritable  Affect:  Appropriate  Thought Process:  Linear and Descriptions of Associations: Intact  Orientation:  Full (Time, Place, and Person)  Thought Content:  Hallucinations: None  Suicidal Thoughts:  No  Homicidal Thoughts:  No  Memory:  Immediate;   Good Recent;   Good Remote;   Good  Judgement:  Fair  Insight:  Shallow  Psychomotor Activity:  Normal  Concentration:  Concentration: Good and Attention Span: Good  Recall:  Good  Fund of Knowledge:  Good  Language:  Good  Akathisia:  No  Handed:    AIMS (if indicated):     Assets:  Housing  ADL's:  Intact  Cognition:  WNL  Sleep:  Number of Hours: 5.5     Treatment Plan Summary:  Major depressive disorder: Continue Cymbalta 60 mg by mouth daily  Insomnia: continue Vistaril 50 mg by mouth daily at bedtime when necessary for insomnia  Chronic pain: continue  Lidoderm  patches and she will be continued on tramadol. Patient states that she has been taking 100 mg by mouth 4 times a day.  Architect [place] controlled substance database confirms her dose.  Hypertension continue lisinopril. I will order low sodium diet  Arthritis continue prednisone and methotrexate. Patient is on folic acid daily  Osteoporosis continue calcium and vitamin D  Constipation continue Colace 100 mg by mouth twice a day  Dispo: I will discuss the case with the social worker believes that there have been multiple failures in trying to place her into an assisted living facility and not sure that we will be able to do anything different.  We obtain a collateral information from social workers from [place] was stated that the patient actually declined possible placement because the facility was not willing to allow her to move in with her cat. Our social worker will try to contact the same facility as apparently they had accepted her with a letter of guarantee (letter guarantees that she has applied for Medicaid and is pending). The social workers from [place] stated that the patient has been approved for Medicaid but the Medicaid is still pending.  Hospital today the social worker has not been able yet to identify a facility that we'll assess the patient. Many facilities are not willing to allow patients to coming with pets.  Patient at times is difficult to served and has many demands.   Follow-up patient will continue to follow-up with her primary care. As she is states she does not have the financial means to afford to see two physicians.  Patient states that she has tried Meals on Wheels but the food was terrible and she did not like it. Patient does not have the means to pay for home help and her insurance does not cover it. Family has not offered to help.  Plan to discharge her on Friday  PPD was placed on September 19.  September 21, MD 05/31/2016,  6:09  PM

## 2016-05-31 NOTE — Progress Notes (Signed)
Pt awake and alert, calm and cooperative this morning. Brightens on approach. Isolative to room, did not attend groups. Requested Actor assisted pt with showering by washing her hair and helping her to dress. Denies SI/HI/AVH. Awaiting placement with assisted living. Reports pain 5-7/10, and refuses anything more for pain at this time.  Support and encouragement provided. Safety maintained with every 15 minute checks. Medications administered. Will continue to monitor.

## 2016-05-31 NOTE — Progress Notes (Signed)
D: Pt interacting appropriately with staff and peers this evening. She brightens upon approach. Denies SI/HI/AVH at this time. Pt asks questions appropriately about medications, specifically serotonin syndrome. She continues to report a desire to find placement in assisted living. A: Emotional support and encouragement provided. Medications administered with education. q15 minute safety checks maintained. R: Pt remains free from harm. Will continue to monitor.

## 2016-05-31 NOTE — Progress Notes (Signed)
Recreation Therapy Notes  Date: 09.20.17 Time: 1:00 pm Location: Craft Room  Group Topic: Self-esteem  Goal Area(s) Addresses:  Patient will write at least one positive trait about self. Patient will verbalize benefit of having a healthy self-esteem.   Behavioral Response: Did not attend  Intervention: I Am  Activity: Patients were given a worksheet with the letter I on it and instructed to write as many positive traits about themselves inside the letter.  Education: LRT educated patients on ways they can increase their self-esteem.  Education Outcome: Patient did not attend group.   Clinical Observations/Feedback: Patient did not attend group.  [name], LRT/CTRS 05/31/2016 1:55 PM

## 2016-05-31 NOTE — Plan of Care (Signed)
Problem: [facility] Participation in Recreation Therapeutic Interventions Goal: STG-Patient will demonstrate improved self esteem by identif STG: Self-Esteem - Within 4 treatment sessions, patient will verbalize at least 5 positive affirmation statements in each of 2 treatment sessions to increase self-esteem post d/c.  Outcome: Not Progressing Treatment Session 2; Completed 1 out of 2: At approximately 2:20 pm, LRT attempted treatment session. Patient refused and requested LRT come back tomorrow.  [name], LRT/CTRS  09.20.17 2:39 pm Goal: STG-Other Recreation Therapy Goal (Specify) STG: Stress Management - Within 4 treatment sessions, patient will verbalize understanding of the stress management techniques in each of 2 treatment sessions to increase stress management skills post d/c.  Outcome: Not Progressing Treatment Session 2; Completed 1 out of 2: At approximately 2:20 pm, LRT attempted treatment session. Patient refused and requested LRT come back tomorrow.  [name], LRT/CTRS  09.20.17 2:39 pm

## 2016-05-31 NOTE — Tx Team (Signed)
Interdisciplinary Treatment and Diagnostic Plan Update  05/31/2016 Time of Session: 5:12 PM  Tara Brown MRN: [id number]  Principal Diagnosis: MDD (major depressive disorder), recurrent severe, without psychosis (HCC)  Secondary Diagnoses: Principal Problem:   MDD (major depressive disorder), recurrent severe, without psychosis (HCC) Active Problems:   Anemia of chronic disease   Rheumatoid arthritis (HCC)   Osteoporosis   HTN (hypertension)   Current Medications:  Current Facility-Administered Medications  Medication Dose Route Frequency Provider Last Rate Last Dose  . acetaminophen (TYLENOL) tablet 650 mg  650 mg Oral Q6H PRN Shuvon B Rankin, NP      . alum & mag hydroxide-simeth (MAALOX/MYLANTA) 200-200-20 MG/5ML suspension 30 mL  30 mL Oral Q4H PRN Shuvon B Rankin, NP      . calcium-vitamin D (OSCAL WITH D) 500-200 MG-UNIT per tablet 1 tablet  1 tablet Oral BID Shuvon B Rankin, NP   1 tablet at 05/31/16 1624  . docusate sodium (COLACE) capsule 100 mg  100 mg Oral BID Jimmy Footman, MD   100 mg at 05/31/16 1624  . DULoxetine (CYMBALTA) DR capsule 60 mg  60 mg Oral Daily Shuvon B Rankin, NP   60 mg at 05/31/16 0808  . feeding supplement (ENSURE ENLIVE) (ENSURE ENLIVE) liquid 237 mL  237 mL Oral TID BM Jimmy Footman, MD   237 mL at 05/30/16 2037  . ferrous sulfate tablet 325 mg  325 mg Oral BID WC Shuvon B Rankin, NP   325 mg at 05/31/16 1624  . fluocinonide cream (LIDEX) 0.05 % 1 application  1 application Topical BID PRN Shuvon B Rankin, NP      . folic acid (FOLVITE) tablet 1 mg  1 mg Oral Daily Shuvon B Rankin, NP   1 mg at 05/31/16 0807  . hydrOXYzine (ATARAX/VISTARIL) tablet 50 mg  50 mg Oral QHS PRN Jimmy Footman, MD   50 mg at 05/30/16 2132  . lidocaine (LIDODERM) 5 % 2 patch  2 patch Transdermal Q24H Jimmy Footman, MD   2 patch at 05/31/16 1624  . lisinopril (PRINIVIL,ZESTRIL) tablet 40 mg  40 mg Oral Daily Shuvon B Rankin,  NP   40 mg at 05/31/16 0807  . magnesium hydroxide (MILK OF MAGNESIA) suspension 30 mL  30 mL Oral Daily PRN Shuvon B Rankin, NP      . [START ON 06/01/2016] methotrexate (RHEUMATREX) tablet 7.5 mg  7.5 mg Oral Once per day on Thu Fri Shuvon B Rankin, NP      . predniSONE (DELTASONE) tablet 10 mg  10 mg Oral Q breakfast Shuvon B Rankin, NP   10 mg at 05/31/16 0807  . traMADol (ULTRAM) tablet 100 mg  100 mg Oral TID WC & HS Jimmy Footman, MD   100 mg at 05/31/16 1624  . tuberculin injection 5 Units  5 Units Intradermal Once Jimmy Footman, MD   5 Units at 05/30/16 1752    PTA Medications: Prescriptions Prior to Admission  Medication Sig Dispense Refill Last Dose  . calcium-vitamin D (OSCAL WITH D) 500-200 MG-UNIT tablet Take 1 tablet by mouth 2 (two) times daily.   Past Month at Unknown time  . DULoxetine (CYMBALTA) 60 MG capsule Take 1 capsule (60 mg total) by mouth daily. 30 capsule 0 Past Month at Unknown time  . ferrous sulfate 325 (65 FE) MG tablet Take 1 tablet (325 mg total) by mouth 2 (two) times daily with a meal. 30 tablet 0 Past Month at Unknown time  . fluocinonide cream (LIDEX)  0.05 % Apply 1 application topically 2 (two) times daily as needed (for psorasis).   unknown  . folic acid (FOLVITE) 1 MG tablet Take 1 mg by mouth daily.  0 Past Month at Unknown time  . HUMIRA PEN 40 MG/0.8ML PNKT Inject 40 mg into the skin every 14 (fourteen) days.  0 Past Month at Unknown time  . hydrOXYzine (ATARAX/VISTARIL) 25 MG tablet Take 1 tablet (25 mg total) by mouth 2 (two) times daily as needed for anxiety. (Patient not taking: Reported on 05/25/2016) 30 tablet 0 Not Taking at Unknown time  . lisinopril (PRINIVIL,ZESTRIL) 40 MG tablet Take 40 mg by mouth daily.   Past Month at Unknown time  . methotrexate (RHEUMATREX) 2.5 MG tablet Take 7.5 mg by mouth 2 (two) times a week. Thursday and Friday  0 Past Month at unknown time  . predniSONE (DELTASONE) 10 MG tablet Take 10 mg by  mouth daily with breakfast.   Past Month at Unknown time  . traMADol (ULTRAM) 50 MG tablet Take 50 mg by mouth every 6 (six) hours as needed for moderate pain or severe pain.   0 Past Month at Unknown time  . traZODone (DESYREL) 100 MG tablet Take 1 tablet (100 mg total) by mouth at bedtime as needed for sleep. 30 tablet 0 Past Month at Unknown time    Treatment Modalities: Medication Management, Group therapy, Case management,  1 to 1 session with clinician, Psychoeducation, Recreational therapy.   Physician Treatment Plan for Primary Diagnosis: MDD (major depressive disorder), recurrent severe, without psychosis (HCC) Long Term Goal(s): Improvement in symptoms so as ready for discharge  Short Term Goals: Ability to identify changes in lifestyle to reduce recurrence of condition will improve, Ability to verbalize feelings will improve, Ability to disclose and discuss suicidal ideas, Ability to identify and develop effective coping behaviors will improve and Ability to identify triggers associated with substance abuse/mental health issues will improve  Medication Management: Evaluate patient's response, side effects, and tolerance of medication regimen.  Therapeutic Interventions: 1 to 1 sessions, Unit Group sessions and Medication administration.  Evaluation of Outcomes: Progressing  Physician Treatment Plan for Secondary Diagnosis: Principal Problem:   MDD (major depressive disorder), recurrent severe, without psychosis (HCC) Active Problems:   Anemia of chronic disease   Rheumatoid arthritis (HCC)   Osteoporosis   HTN (hypertension)   Long Term Goal(s): Improvement in symptoms so as ready for discharge  Short Term Goals: Ability to identify changes in lifestyle to reduce recurrence of condition will improve and Ability to identify triggers associated with substance abuse/mental health issues will improve  Medication Management: Evaluate patient's response, side effects, and  tolerance of medication regimen.  Therapeutic Interventions: 1 to 1 sessions, Unit Group sessions and Medication administration.  Evaluation of Outcomes: Progressing   RN Treatment Plan for Primary Diagnosis: MDD (major depressive disorder), recurrent severe, without psychosis (HCC) Long Term Goal(s): Knowledge of disease and therapeutic regimen to maintain health will improve  Short Term Goals: Ability to remain free from injury will improve, Ability to verbalize frustration and anger appropriately will improve, Ability to participate in decision making will improve, Ability to identify and develop effective coping behaviors will improve and Compliance with prescribed medications will improve  Medication Management: RN will administer medications as ordered by provider, will assess and evaluate patient's response and provide education to patient for prescribed medication. RN will report any adverse and/or side effects to prescribing provider.  Therapeutic Interventions: 1 on 1 counseling sessions, Psychoeducation,  Medication administration, Evaluate responses to treatment, Monitor vital signs and CBGs as ordered, Perform/monitor CIWA, COWS, AIMS and Fall Risk screenings as ordered, Perform wound care treatments as ordered.  Evaluation of Outcomes: Progressing   LCSW Treatment Plan for Primary Diagnosis: MDD (major depressive disorder), recurrent severe, without psychosis (HCC) Long Term Goal(s): Safe transition to appropriate next level of care at discharge, Engage patient in therapeutic group addressing interpersonal concerns.  Short Term Goals: Engage patient in aftercare planning with referrals and resources, Increase social support, Facilitate acceptance of mental health diagnosis and concerns and Increase skills for wellness and recovery  Therapeutic Interventions: Assess for all discharge needs, 1 to 1 time with Social worker, Explore available resources and support systems, Assess for  adequacy in community support network, Educate family and significant other(s) on suicide prevention, Complete Psychosocial Assessment, Interpersonal group therapy.  Evaluation of Outcomes: Progressing   Progress in Treatment: Attending groups: Intermittently Participating in groups: Intermittently Taking medication as prescribed: Yes, MD continues to assess for medication changes as needed Toleration medication: Yes, no side effects reported at this time Family/Significant other contact made: CSW, still assessing for appropriate contacts Patient understands diagnosis: Yes Discussing patient identified problems/goals with staff: Yes Medical problems stabilized or resolved:  Yes  Denies suicidal/homicidal ideation: Yes Issues/concerns per patient self-inventory: None Other: N/A  New problem(s) identified: None identified at this time.   New Short Term/Long Term Goal(s): None identified at this time.   Discharge Plan or Barriers: Still assessing Reason for Continuation of Hospitalization: Anxiety Depression Suicidal ideation   Estimated Length of Stay/Date of discharge: 3-5 days Attendees: Patient: 05/31/2016  5:02 PM  Physician: Dr. Ardyth Harps, MD 05/31/2016  5:02 PM  Nursing: Hulan Amato, RN 05/31/2016  5:02 PM  RN Care Manager: 05/31/2016  5:02 PM  Social Worker: York Grice, [place], LCAS 05/31/2016  5:02 PM  Recreational Therapist: Hershal Coria, LRT 05/31/2016  5:02 PM  Social Worker: Jerald Kief, LCSWA 05/31/2016  5:02 PM  Social Worker: Hampton Abbot, LCSWA 05/31/2016  5:02 PM  Other: 05/31/2016  5:02 PM    Scribe for Treatment Team:  Dorothe Pea. Lizann Edelman MSW, LCSWA, LCAS

## 2016-05-31 NOTE — Plan of Care (Signed)
Problem: Coping: Goal: Ability to verbalize feelings will improve Outcome: Progressing Pt is able to verbalize feelings. Denies SI/HI/AVH. Reports less depression and voices she wants to discharge to assisted living. Support and encouragement provided. Will continue to monitor.

## 2016-06-01 MED ORDER — ZOLPIDEM TARTRATE 5 MG PO TABS
5.0000 mg | ORAL_TABLET | Freq: Every day | ORAL | Status: DC
  Administered 2016-06-01: 5 mg via ORAL
  Filled 2016-06-01: qty 1

## 2016-06-01 NOTE — Plan of Care (Signed)
Problem: Activity: Goal: Interest or engagement in leisure activities will improve Outcome: Not Progressing Isolates to room, minimal interaction with peers

## 2016-06-01 NOTE — Progress Notes (Signed)
D: Pt denies SI/HI/AVH. Pt is pleasant and cooperative, affect is bright. Pt stated she feels better from taking medication and resting. Patient appears less anxious and she is interacting with peers and staff appropriately.  A: Pt was offered support and encouragement. Pt was given scheduled medications. Pt was encouraged to attend groups. Q 15 minute checks were done for safety.  R:Pt did not attend evening group.Pt is complaint with  medication. Pt has no complaints.Pt receptive to treatment and safety maintained on unit.

## 2016-06-01 NOTE — Progress Notes (Signed)
[facility]  06/01/2016 9:03 PM Tara KannerJennifer Brown  MRN:  [id number]  Subjective: Tara Brown denies any symptoms of depression, anxiety or psychosis. She is not suicidal or homicidal. She tolerates medications well. Her pain is under control. She still complains of poor sleep. We possibly identified a facility that would accept her with her cat. She will be discharged as soon as placement is secured.   Principal Problem: MDD (major depressive disorder), recurrent severe, without psychosis (HCC) Diagnosis:   Patient Active Problem List   Diagnosis Date Noted  . Osteoporosis [M81.0] 05/29/2016  . HTN (hypertension) [I10] 05/29/2016  . MDD (major depressive disorder), recurrent severe, without psychosis (HCC) [F33.2] 05/28/2016  . Rheumatoid arthritis (HCC) [M06.9] 12/06/2015  . Anemia of chronic disease [D63.8] 03/08/2015   Total Time spent with patient: 30 minutes  Past Psychiatric History:   Past Medical History:  Past Medical History:  Diagnosis Date  . Anemia   . Arthritis   . 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 Osteoporosis     Past Surgical History:  Procedure Laterality Date  . AMPUTATION Left 12/07/2015   Procedure: AMPUTATION LEFT GREAT TOE;  Surgeon: Nadara MustardMarcus Duda V, MD;  Location: [facility];  Service: Orthopedics;  Laterality: Left;  . FOOT SURGERY    . HAND SURGERY    . HIP FRACTURE SURGERY    . KNEE SURGERY    . NECK SURGERY     Family History:  Family History  Problem Relation Age of Onset  . Bipolar disorder Sister   . Suicidality Other   . Schizophrenia Other   . Stroke Neg Hx    Family Psychiatric  History:  Social History:  History  Alcohol Use No     History  Drug Use No    Social History   Social History  . Marital status: Divorced    Spouse name: N/A  . Number of children: N/A  . Years of education: N/A   Social History Main Topics  . Smoking status: Never Smoker  . Smokeless  tobacco: Never Used  . Alcohol use No  . Drug use: No  . Sexual activity: No   Other Topics Concern  . None   Social History Narrative  . None    Current Medications: Current Facility-Administered Medications  Medication Dose Route Frequency Provider Last Rate Last Dose  . acetaminophen (TYLENOL) tablet 650 mg  650 mg Oral Q6H PRN Shuvon B Rankin, NP      . alum & mag hydroxide-simeth (MAALOX/MYLANTA) 200-200-20 MG/5ML suspension 30 mL  30 mL Oral Q4H PRN Shuvon B Rankin, NP      . calcium-vitamin D (OSCAL WITH D) 500-200 MG-UNIT per tablet 1 tablet  1 tablet Oral BID Shuvon B Rankin, NP   1 tablet at 06/01/16 1708  . docusate sodium (COLACE) capsule 100 mg  100 mg Oral BID Jimmy FootmanAndrea Hernandez-Gonzalez, MD   100 mg at 06/01/16 1708  . DULoxetine (CYMBALTA) DR capsule 60 mg  60 mg Oral Daily Shuvon B Rankin, NP   60 mg at 06/01/16 0846  . feeding supplement (ENSURE ENLIVE) (ENSURE ENLIVE) liquid 237 mL  237 mL Oral TID BM Jimmy FootmanAndrea Hernandez-Gonzalez, MD   237 mL at 06/01/16 1300  . ferrous sulfate tablet 325 mg  325 mg Oral BID WC Shuvon B Rankin, NP   325 mg at 06/01/16 1708  . fluocinonide cream (LIDEX) 0.05 % 1 application  1 application Topical BID PRN Shuvon  B Rankin, NP      . folic acid (FOLVITE) tablet 1 mg  1 mg Oral Daily Shuvon B Rankin, NP   1 mg at 06/01/16 0846  . hydrOXYzine (ATARAX/VISTARIL) tablet 50 mg  50 mg Oral QHS PRN Jimmy Footman, MD   50 mg at 05/31/16 2221  . lidocaine (LIDODERM) 5 % 2 patch  2 patch Transdermal Q24H Jimmy Footman, MD   2 patch at 06/01/16 1711  . lisinopril (PRINIVIL,ZESTRIL) tablet 40 mg  40 mg Oral Daily Shuvon B Rankin, NP   40 mg at 06/01/16 0846  . magnesium hydroxide (MILK OF MAGNESIA) suspension 30 mL  30 mL Oral Daily PRN Shuvon B Rankin, NP      . methotrexate (RHEUMATREX) tablet 7.5 mg  7.5 mg Oral Once per day on Thu Fri Shuvon B Rankin, NP   7.5 mg at 06/01/16 0846  . predniSONE (DELTASONE) tablet 10 mg  10 mg Oral  Q breakfast Shuvon B Rankin, NP   10 mg at 06/01/16 0846  . traMADol (ULTRAM) tablet 100 mg  100 mg Oral TID WC & HS Jimmy Footman, MD   100 mg at 06/01/16 1708    Lab Results: No results found for this or any previous visit (from the past 48 hour(s)).  Blood Alcohol level:  Lab Results  Component Value Date   ETH <5 05/25/2016   ETH <5 04/23/2016    Metabolic Disorder Labs: No results found for: HGBA1C, MPG No results found for: PROLACTIN No results found for: CHOL, TRIG, HDL, CHOLHDL, VLDL, LDLCALC  Physical Findings: AIMS:  , ,  ,  ,    CIWA:    COWS:     Musculoskeletal: Strength & Muscle Tone: within normal limits Gait & Station: normal Patient leans: N/A  Psychiatric Specialty Exam: Physical Exam  Nursing note and vitals reviewed. Musculoskeletal: She exhibits deformity.    Review of Systems  Psychiatric/Behavioral: Positive for depression.  All other systems reviewed and are negative.   Blood pressure 106/61, pulse 67, temperature 98 F (36.7 C), temperature source Oral, resp. rate 18, height 5\' 6"  (1.676 m), weight 49.9 kg (110 lb), SpO2 97 %.Body mass index is 17.75 kg/m.  General Appearance: Well Groomed  Eye Contact:  Good  Speech:  Clear and Coherent  Volume:  Normal  Mood:  Irritable  Affect:  Appropriate  Thought Process:  Linear and Descriptions of Associations: Intact  Orientation:  Full (Time, Place, and Person)  Thought Content:  Hallucinations: None  Suicidal Thoughts:  No  Homicidal Thoughts:  No  Memory:  Immediate;   Good Recent;   Good Remote;   Good  Judgement:  Fair  Insight:  Shallow  Psychomotor Activity:  Normal  Concentration:  Concentration: Good and Attention Span: Good  Recall:  Good  Fund of Knowledge:  Good  Language:  Good  Akathisia:  No  Handed:    AIMS (if indicated):     Assets:  Housing  ADL's:  Intact  Cognition:  WNL  Sleep:  Number of Hours:  4.15     Treatment Plan Summary:  Tara Brown has a history of depression, chronic pain, and multiple medical problems admitted for suicidal ideation.  1. Suicidal ideation. This has resolved.  2. Major depressive disorder. We continued Cymbalta.  3. Insomnia. Trazodone was discontinued. We offered Vistaril.   4. Chronic pain. We continued Tramadol and Lidoderm patches. Tramadol dose was confirmed by the [facility] controlled substance  database.   5. Hypertension. We continued lisinopril.   6. Arthritis. We continued prednisone, methotrexate and folic acid.  7. Osteoporosis. We continued calcium and vitamin D.  8. Constipation. We continued Colace.   9. Disposition. She will likely be discharged to home. There is a history of multiple attempts to place. Her Medicaid is pending. No facility agreed to accept the patient with her cat. PPD placed.   Kristine Linea, MD 06/01/2016, 9:03 PM

## 2016-06-01 NOTE — [facility] Group Notes (Signed)
Goals Group  Date/Time: 06/01/2016, 9:00AM Type of Therapy and Topic: Group Therapy: Goals Group: SMART Goals  ?  Participation Level: Moderate  ?  Description of Group:  ?  The purpose of a daily goals group is to assist and guide patients in setting recovery/wellness-related goals. The objective is to set goals as they relate to the crisis in which they were admitted. Patients will be using SMART goal modalities to set measurable goals. Characteristics of realistic goals will be discussed and patients will be assisted in setting and processing how one will reach their goal. Facilitator will also assist patients in applying interventions and coping skills learned in psycho-education groups to the SMART goal and process how one will achieve defined goal.  ?  Therapeutic Goals:  ?  -Patients will develop and document one goal related to or their crisis in which brought them into treatment.  -Patients will be guided by LCSW using SMART goal setting modality in how to set a measurable, attainable, realistic and time sensitive goal.  -Patients will process barriers in reaching goal.  -Patients will process interventions in how to overcome and successful in reaching goal.  ?  Patient's Goal: Pt invited but did not attend. ?  Therapeutic Modalities:  Motivational Interviewing  Cognitive Behavioral Therapy  Crisis Intervention Model  SMART goals setting   Ethel Veronica, MSW, LCSW-A 

## 2016-06-01 NOTE — Progress Notes (Signed)
Recreation Therapy Notes  Date: 09.21.17 Time: 9:30 am Location: Craft Room  Group Topic: Coping Skills/Leisure Education  Goal Area(s) Addresses:  Patient will identify things they are grateful for.  Patient will identify how being grateful can influence their decision making.  Behavioral Response: Did not attend  Intervention: Grateful Wheel  Activity: Patients were given an I Am Grateful For worksheet and instructed to write things they are grateful for under each category.  Education: LRT educated group on why it is important to be grateful.  Education Outcome: Patient did not attend group.  Clinical Observations/Feedback: Patient did not attend group.  [name], LRT/CTRS 06/01/2016 10:13 AM

## 2016-06-01 NOTE — Plan of Care (Signed)
Problem: [facility] Participation in Recreation Therapeutic Interventions Goal: STG-Patient will demonstrate improved self esteem by identif STG: Self-Esteem - Within 4 treatment sessions, patient will verbalize at least 5 positive affirmation statements in each of 2 treatment sessions to increase self-esteem post d/c.  Outcome: Completed/Met Date Met: 06/01/16 Treatment Session 3; Completed 2 out of 2: At approximately 1:20 pm, LRT met with patient in patient room. Patient verbalized 5 positive affirmation statements. Patient reported it felt "good". LRT encouraged patient to continue saying positive affirmation statements.  Leonette Monarch, LRT/CTRS 09.21.17 1:27 pm Goal: STG-Other Recreation Therapy Goal (Specify) STG: Stress Management - Within 4 treatment sessions, patient will verbalize understanding of the stress management techniques in each of 2 treatment sessions to increase stress management skills post d/c.  Outcome: Not Progressing Treatment Session 3; Completed 1 out of 2: At approximately 1:20 pm, LRT met with patient in patient room. Patient reported she had not read over the stress management techniques. LRT encouraged patient to read over and practice the stress management techniques.  Leonette Monarch, LRT/CTRS 09.21.17 1:28 pm

## 2016-06-01 NOTE — Progress Notes (Signed)
Rates depression as a 6/10.  Denies SI at this time.  Isolates to room. Appetite poor.  Support and encouragement offered. Safety maintained.

## 2016-06-02 MED ORDER — TRAMADOL HCL 50 MG PO TABS: 50.0000 mg | ORAL_TABLET | Freq: Four times a day (QID) | ORAL | 0 refills | Status: DC | PRN

## 2016-06-02 MED ORDER — PREDNISONE 10 MG PO TABS: 10.0000 mg | ORAL_TABLET | Freq: Every day | ORAL | 0 refills | Status: DC

## 2016-06-02 MED ORDER — TEMAZEPAM 15 MG PO CAPS
15.0000 mg | ORAL_CAPSULE | Freq: Every day | ORAL | Status: DC
  Administered 2016-06-02 – 2016-06-03 (×2): 15 mg via ORAL
  Filled 2016-06-02 (×2): qty 1

## 2016-06-02 MED ORDER — METHOTREXATE 2.5 MG PO TABS: 7.5000 mg | ORAL_TABLET | ORAL | 0 refills | Status: DC

## 2016-06-02 MED ORDER — TEMAZEPAM 15 MG PO CAPS: 15.0000 mg | ORAL_CAPSULE | Freq: Every day | ORAL | 0 refills | Status: DC

## 2016-06-02 MED ORDER — DULOXETINE HCL 60 MG PO CPEP: 60.0000 mg | ORAL_CAPSULE | Freq: Every day | ORAL | 0 refills | Status: DC

## 2016-06-02 MED ORDER — LIDOCAINE 5 % EX PTCH: 2.0000 | MEDICATED_PATCH | CUTANEOUS | 0 refills | Status: DC

## 2016-06-02 MED ORDER — HYDROXYZINE HCL 50 MG PO TABS: 50.0000 mg | ORAL_TABLET | Freq: Every evening | ORAL | 0 refills | Status: DC | PRN

## 2016-06-02 NOTE — Progress Notes (Signed)
Recreation Therapy Notes  Date: 09.22.17 Time: 1:00 pm Location: Craft Room  Group Topic: Self-expression/Coping skills  Goal Area(s) Addresses:  Patient will effectively use art as a means of self-expression. Patient will recognize positive benefit of self-expression. Patient will be able to identify one emotion experienced during group session. Patient will identify use of art as a coping skill.  Behavioral Response: Attentive  Intervention: Two Faces of Me  Activity: Patients were given a blank face worksheet and instructed to draw a line down the middle. On one side of the face, they were instructed to draw or write how they felt when they were admitted to the hospital. On the other side, they were instructed to draw or write how they want to feel when they are d/c from the hospital.  Education: LRT educated group on other forms of self-expression.  Education Outcome: Acknowledges education/In group clarification offered  Clinical Observations/Feedback: Patient drew how she felt when she was admitted and how she wants to feel when she is d/c. Patient did not contribute to group discussion.  [name], LRT/CTRS 06/02/2016 3:17 PM

## 2016-06-02 NOTE — Plan of Care (Signed)
Problem: [facility] Participation in Recreation Therapeutic Interventions Goal: STG-Other Recreation Therapy Goal (Specify) STG: Stress Management - Within 4 treatment sessions, patient will verbalize understanding of the stress management techniques in each of 2 treatment sessions to increase stress management skills post d/c.  Outcome: Completed/Met Date Met: 06/02/16 Treatment Session 4; Completed 2 out of 2: At approximately 10:15 am, LRT met with patient in patient room. Patient verbalized understanding of the stress management techniques. LRT encouraged patient to use the techniques.  Leonette Monarch, LRT/CTRS 09.22.17 10:23 am

## 2016-06-02 NOTE — [facility] Group Notes (Signed)
[facility] LCSW Group Therapy   06/02/2016 9:30 AM   Type of Therapy: Group Therapy   Participation Level: Pt invited but did not attend.   Participation Quality: Pt invited but did not attend.   Hampton Abbot, MSW, LCSWA 06/02/2016, 12:03PM

## 2016-06-02 NOTE — Tx Team (Signed)
Interdisciplinary Treatment and Diagnostic Plan Update  06/02/2016 Time of Session: 11:42 AM  Tara Brown MRN: [id number]  Principal Diagnosis: MDD (major depressive disorder), recurrent severe, without psychosis (HCC)  Secondary Diagnoses: Principal Problem:   MDD (major depressive disorder), recurrent severe, without psychosis (HCC) Active Problems:   Anemia of chronic disease   Rheumatoid arthritis (HCC)   Osteoporosis   HTN (hypertension)   Current Medications:  Current Facility-Administered Medications  Medication Dose Route Frequency Provider Last Rate Last Dose  . acetaminophen (TYLENOL) tablet 650 mg  650 mg Oral Q6H PRN Shuvon B Rankin, NP      . alum & mag hydroxide-simeth (MAALOX/MYLANTA) 200-200-20 MG/5ML suspension 30 mL  30 mL Oral Q4H PRN Shuvon B Rankin, NP      . calcium-vitamin D (OSCAL WITH D) 500-200 MG-UNIT per tablet 1 tablet  1 tablet Oral BID Shuvon B Rankin, NP   1 tablet at 06/02/16 0850  . docusate sodium (COLACE) capsule 100 mg  100 mg Oral BID Jimmy Footman, MD   100 mg at 06/02/16 0850  . DULoxetine (CYMBALTA) DR capsule 60 mg  60 mg Oral Daily Shuvon B Rankin, NP   60 mg at 06/02/16 0850  . feeding supplement (ENSURE ENLIVE) (ENSURE ENLIVE) liquid 237 mL  237 mL Oral TID BM Jimmy Footman, MD   237 mL at 06/01/16 2100  . ferrous sulfate tablet 325 mg  325 mg Oral BID WC Shuvon B Rankin, NP   325 mg at 06/02/16 0850  . fluocinonide cream (LIDEX) 0.05 % 1 application  1 application Topical BID PRN Shuvon B Rankin, NP      . folic acid (FOLVITE) tablet 1 mg  1 mg Oral Daily Shuvon B Rankin, NP   1 mg at 06/02/16 0850  . hydrOXYzine (ATARAX/VISTARIL) tablet 50 mg  50 mg Oral QHS PRN Jimmy Footman, MD   50 mg at 06/01/16 2130  . lidocaine (LIDODERM) 5 % 2 patch  2 patch Transdermal Q24H Jimmy Footman, MD   2 patch at 06/01/16 1711  . lisinopril (PRINIVIL,ZESTRIL) tablet 40 mg  40 mg Oral Daily Shuvon B Rankin,  NP   40 mg at 06/02/16 0850  . magnesium hydroxide (MILK OF MAGNESIA) suspension 30 mL  30 mL Oral Daily PRN Shuvon B Rankin, NP      . methotrexate (RHEUMATREX) tablet 7.5 mg  7.5 mg Oral Once per day on Thu Fri Shuvon B Rankin, NP   7.5 mg at 06/02/16 0850  . predniSONE (DELTASONE) tablet 10 mg  10 mg Oral Q breakfast Shuvon B Rankin, NP   10 mg at 06/02/16 0850  . traMADol (ULTRAM) tablet 100 mg  100 mg Oral TID WC & HS Jimmy Footman, MD   100 mg at 06/02/16 0850  . zolpidem (AMBIEN) tablet 5 mg  5 mg Oral QHS Shari Prows, MD   5 mg at 06/01/16 2134    PTA Medications: Prescriptions Prior to Admission  Medication Sig Dispense Refill Last Dose  . calcium-vitamin D (OSCAL WITH D) 500-200 MG-UNIT tablet Take 1 tablet by mouth 2 (two) times daily.   Past Month at Unknown time  . DULoxetine (CYMBALTA) 60 MG capsule Take 1 capsule (60 mg total) by mouth daily. 30 capsule 0 Past Month at Unknown time  . ferrous sulfate 325 (65 FE) MG tablet Take 1 tablet (325 mg total) by mouth 2 (two) times daily with a meal. 30 tablet 0 Past Month at Unknown time  . fluocinonide  cream (LIDEX) 0.05 % Apply 1 application topically 2 (two) times daily as needed (for psorasis).   unknown  . folic acid (FOLVITE) 1 MG tablet Take 1 mg by mouth daily.  0 Past Month at Unknown time  . HUMIRA PEN 40 MG/0.8ML PNKT Inject 40 mg into the skin every 14 (fourteen) days.  0 Past Month at Unknown time  . hydrOXYzine (ATARAX/VISTARIL) 25 MG tablet Take 1 tablet (25 mg total) by mouth 2 (two) times daily as needed for anxiety. (Patient not taking: Reported on 05/25/2016) 30 tablet 0 Not Taking at Unknown time  . lisinopril (PRINIVIL,ZESTRIL) 40 MG tablet Take 40 mg by mouth daily.   Past Month at Unknown time  . methotrexate (RHEUMATREX) 2.5 MG tablet Take 7.5 mg by mouth 2 (two) times a week. Thursday and Friday  0 Past Month at unknown time  . predniSONE (DELTASONE) 10 MG tablet Take 10 mg by mouth daily with  breakfast.   Past Month at Unknown time  . traMADol (ULTRAM) 50 MG tablet Take 50 mg by mouth every 6 (six) hours as needed for moderate pain or severe pain.   0 Past Month at Unknown time  . traZODone (DESYREL) 100 MG tablet Take 1 tablet (100 mg total) by mouth at bedtime as needed for sleep. 30 tablet 0 Past Month at Unknown time    Treatment Modalities: Medication Management, Group therapy, Case management,  1 to 1 session with clinician, Psychoeducation, Recreational therapy.   Physician Treatment Plan for Primary Diagnosis: MDD (major depressive disorder), recurrent severe, without psychosis (HCC) Long Term Goal(s): Improvement in symptoms so as ready for discharge  Short Term Goals: Ability to identify changes in lifestyle to reduce recurrence of condition will improve, Ability to verbalize feelings will improve, Ability to disclose and discuss suicidal ideas, Ability to identify and develop effective coping behaviors will improve and Ability to identify triggers associated with substance abuse/mental health issues will improve  Medication Management: Evaluate patient's response, side effects, and tolerance of medication regimen.  Therapeutic Interventions: 1 to 1 sessions, Unit Group sessions and Medication administration.  Evaluation of Outcomes: Progressing  Physician Treatment Plan for Secondary Diagnosis: Principal Problem:   MDD (major depressive disorder), recurrent severe, without psychosis (HCC) Active Problems:   Anemia of chronic disease   Rheumatoid arthritis (HCC)   Osteoporosis   HTN (hypertension)   Long Term Goal(s): Improvement in symptoms so as ready for discharge  Short Term Goals: Ability to identify changes in lifestyle to reduce recurrence of condition will improve and Ability to identify triggers associated with substance abuse/mental health issues will improve  Medication Management: Evaluate patient's response, side effects, and tolerance of medication  regimen.  Therapeutic Interventions: 1 to 1 sessions, Unit Group sessions and Medication administration.  Evaluation of Outcomes: Progressing   RN Treatment Plan for Primary Diagnosis: MDD (major depressive disorder), recurrent severe, without psychosis (HCC) Long Term Goal(s): Knowledge of disease and therapeutic regimen to maintain health will improve  Short Term Goals: Ability to remain free from injury will improve, Ability to verbalize frustration and anger appropriately will improve, Ability to participate in decision making will improve, Ability to identify and develop effective coping behaviors will improve and Compliance with prescribed medications will improve  Medication Management: RN will administer medications as ordered by provider, will assess and evaluate patient's response and provide education to patient for prescribed medication. RN will report any adverse and/or side effects to prescribing provider.  Therapeutic Interventions: 1 on 1 counseling  sessions, Psychoeducation, Medication administration, Evaluate responses to treatment, Monitor vital signs and CBGs as ordered, Perform/monitor CIWA, COWS, AIMS and Fall Risk screenings as ordered, Perform wound care treatments as ordered.  Evaluation of Outcomes: Progressing   LCSW Treatment Plan for Primary Diagnosis: MDD (major depressive disorder), recurrent severe, without psychosis (HCC) Long Term Goal(s): Safe transition to appropriate next level of care at discharge, Engage patient in therapeutic group addressing interpersonal concerns.  Short Term Goals: Engage patient in aftercare planning with referrals and resources, Increase social support, Facilitate acceptance of mental health diagnosis and concerns and Increase skills for wellness and recovery  Therapeutic Interventions: Assess for all discharge needs, 1 to 1 time with Social worker, Explore available resources and support systems, Assess for adequacy in community  support network, Educate family and significant other(s) on suicide prevention, Complete Psychosocial Assessment, Interpersonal group therapy.  Evaluation of Outcomes: Progressing   Progress in Treatment: Attending groups: Intermittently Participating in groups: Intermittently Taking medication as prescribed: Yes, MD continues to assess for medication changes as needed Toleration medication: Yes, no side effects reported at this time Family/Significant other contact made: CSW, still assessing for appropriate contacts Patient understands diagnosis: Yes Discussing patient identified problems/goals with staff: Yes Medical problems stabilized or resolved:  Yes  Denies suicidal/homicidal ideation: Yes Issues/concerns per patient self-inventory: None Other: N/A  New problem(s) identified: None identified at this time.   New Short Term/Long Term Goal(s): None identified at this time.   Discharge Plan or Barriers: Still assessing Reason for Continuation of Hospitalization: Anxiety Depression Suicidal ideation   Estimated Length of Stay/Date of discharge: 3-5 days Attendees: Patient: Tara Brown 05/31/2016  11:44AM  Physician: Dr. Ardyth Harps, MD 05/31/2016  11:44 AM  Nursing: Hulan Amato, RN 05/31/2016  11:44 AM  RN Care Manager: 05/31/2016  11:44 AM  Social Worker: Hampton Abbot, MSW, [facility] 05/31/2016  11:44 AM  Recreational Therapist: Hershal Coria, LRT 05/31/2016  11:44 AM  Social Worker: Jerald Kief, LCSWA 05/31/2016  11:44 AM  Social Worker: Hampton Abbot, LCSWA 05/31/2016  11:44 AM    Scribe for Treatment Team:  Hampton Abbot, MSW, LCSWA

## 2016-06-02 NOTE — Progress Notes (Signed)
Isolates to room.  Denies SI.  Anxious.  Tearful and upset.  States "  I've got to go live in a rat trap and loose my cat and everything that I own because I can't lift my arms above my head."  Met wit [facility].  Patient refused to go.  States that "I just want to go home"  Support and encouragement offered.  Safety maintained.

## 2016-06-02 NOTE — [facility] Group Notes (Signed)
[facility] Group Notes:  (Nursing/MHT/Case Management/Adjunct)  Date:  06/02/2016  Time:  4:31 PM  Type of Therapy:  Psychoeducational Skills  Participation Level:  Did Not Attend  Tara Brown 06/02/2016, 4:31 PM

## 2016-06-02 NOTE — Progress Notes (Signed)
D: Pt denies SI/HI/AVH, Pt is pleasant and cooperative, affect is flat and sad but brightens upon approach, Patient's  thoughts are organized and logical no bizarre behavior noted during shift, Pt appears less anxious, but isolated to her room and did not attend  eening group.  A: Pt was offered support and encouragement. Pt was given scheduled medications. Pt was encouraged to attend groups. Q 15 minute checks were done for safety.  R:Pt attends groups and interacts well with peers and staff. Pt is taking medication. Pt has no complaints.Pt receptive to treatment and safety maintained on unit.

## 2016-06-02 NOTE — [facility] Group Notes (Signed)
[facility] Group Notes:  (Nursing/MHT/Case Management/Adjunct)  Date:  06/02/2016  Time:  4:46 AM  Type of Therapy:  Psychoeducational Skills  Participation Level:  Active  Participation Quality:  Appropriate  Affect:  Appropriate  Cognitive:  Appropriate  Insight:  Appropriate  Engagement in Group:  Engaged  Modes of Intervention:  Discussion, Socialization and Support  Summary of Progress/Problems:  Tara Brown 06/02/2016, 4:46 AM

## 2016-06-02 NOTE — Progress Notes (Signed)
  Patient ID: Tara Brown, female   DOB: June 13, 1961, 55 y.o.   MRN: [id number]    CSW, per previous CSW notes from [facility], learned the pt was previously referred to [facility] living Facility, but left of her own accord and was discharged from [facility] and returned to her home. The pt had refused to be admitted into an Assisted living Facility if she could not take her cat.  Pt left and was discharged (the pt's decision according to the CSW at [facility] [employer] Phone: [phone] Fax: [phone]).  Pt was discharged from [facility] the day before [facility] was to assess the pt in person.  Pt then returned to ED and was transported to [facility] where she is currently admiitted, as of the writing of this note.  CSW was given the name of two assisted living facilities who accept cats by the pt, one was [facility] [address] and one was [facility] [place].   CSW was informed the pt, who was pre-approved for Medicaid with the understanding that she be placed in an assisted living facility, would not be eligible because they are private pay only and do not accept Medicaid.  CSW informed the pt who then reported to the CSW, she would allow the CSW to assist her in being accepted to [facility].  The CSW then referred pt to [facility], they visited the pt in the hospital at [facility] on 06/02/16 and the pt informed [facility] she would prefer to return to her home, as she did during her previous admission.  The CSw will assist the pt in returning to her home at discharge, as the pt wishes.  Dorothe Pea. Tolbert Matheson, LCSWA, LCAS   06/02/16

## 2016-06-02 NOTE — Progress Notes (Signed)
[facility] MD Progress Note  06/02/2016 6:21 PM Tara Brown  MRN:  [id number]  Subjective: Tara Brown has a history of depression and multiple medical, disabling problems including rheumatoid arthritis. She was admitted for worsening of depression. She wanted to be placed in an assisted living facility. She met with the facility representative today and decided against it. She wants to return home and try to continue living independently. She would like to be discharged over the weekend. I will get everything ready for discharge. She has a follow-up appointment at [facility] but there is a history of treatment noncompliance. Her sister will arrange for transportation to [place] this weekend.   Today, she denies any symptoms of depression, anxiety or psychosis. She is not suicidal or homicidal. She tolerates medications well. Her pain is under control. Sleep is still a problem. Will switch her to restoril. She will be discharged to home.    Principal Problem: MDD (major depressive disorder), recurrent severe, without psychosis ([place]) Diagnosis:   Patient Active Problem List   Diagnosis Date Noted  . Osteoporosis [M81.0] 05/29/2016  . HTN (hypertension) [I10] 05/29/2016  . MDD (major depressive disorder), recurrent severe, without psychosis ([place]) [F33.2] 05/28/2016  . Rheumatoid arthritis ([place]) [M06.9] 12/06/2015  . Anemia of chronic disease [D63.8] 03/08/2015   Total Time spent with patient: 30 minutes  Past Psychiatric History:   Past Medical History:  Past Medical History:  Diagnosis Date  . Anemia   . Arthritis   . Major depressive disorder, recurrent, severe without psychotic features ([place]) 03/11/2015  . Osteoporosis     Past Surgical History:  Procedure Laterality Date  . AMPUTATION Left 12/07/2015   Procedure: AMPUTATION LEFT GREAT TOE;  Surgeon: Newt Minion, MD;  Location: [facility];  Service: Orthopedics;  Laterality: Left;  . FOOT SURGERY    . HAND SURGERY    . HIP FRACTURE  SURGERY    . KNEE SURGERY    . NECK SURGERY     Family History:  Family History  Problem Relation Age of Onset  . Bipolar disorder Sister   . Suicidality Other   . Schizophrenia Other   . Stroke Neg Hx    Family Psychiatric  History:  Social History:  History  Alcohol Use No     History  Drug Use No    Social History   Social History  . Marital status: Divorced    Spouse name: N/A  . Number of children: N/A  . Years of education: N/A   Social History Main Topics  . Smoking status: Never Smoker  . Smokeless tobacco: Never Used  . Alcohol use No  . Drug use: No  . Sexual activity: No   Other Topics Concern  . None   Social History Narrative  . None    Current Medications: Current Facility-Administered Medications  Medication Dose Route Frequency Provider Last Rate Last Dose  . acetaminophen (TYLENOL) tablet 650 mg  650 mg Oral Q6H PRN Shuvon B Rankin, NP      . alum & mag hydroxide-simeth (MAALOX/MYLANTA) 200-200-20 MG/5ML suspension 30 mL  30 mL Oral Q4H PRN Shuvon B Rankin, NP      . calcium-vitamin D (OSCAL WITH D) 500-200 MG-UNIT per tablet 1 tablet  1 tablet Oral BID Shuvon B Rankin, NP   1 tablet at 06/02/16 1743  . docusate sodium (COLACE) capsule 100 mg  100 mg Oral BID Hildred Priest, MD   100 mg at 06/02/16 1743  . DULoxetine (CYMBALTA) DR  capsule 60 mg  60 mg Oral Daily Shuvon B Rankin, NP   60 mg at 06/02/16 0850  . feeding supplement (ENSURE ENLIVE) (ENSURE ENLIVE) liquid 237 mL  237 mL Oral TID BM Hildred Priest, MD   237 mL at 06/02/16 1400  . ferrous sulfate tablet 325 mg  325 mg Oral BID WC Shuvon B Rankin, NP   325 mg at 06/02/16 1743  . fluocinonide cream (LIDEX) 2.50 % 1 application  1 application Topical BID PRN Shuvon B Rankin, NP      . folic acid (FOLVITE) tablet 1 mg  1 mg Oral Daily Shuvon B Rankin, NP   1 mg at 06/02/16 0850  . hydrOXYzine (ATARAX/VISTARIL) tablet 50 mg  50 mg Oral QHS PRN Hildred Priest,  MD   50 mg at 06/01/16 2130  . lidocaine (LIDODERM) 5 % 2 patch  2 patch Transdermal Q24H Hildred Priest, MD   2 patch at 06/02/16 1744  . lisinopril (PRINIVIL,ZESTRIL) tablet 40 mg  40 mg Oral Daily Shuvon B Rankin, NP   40 mg at 06/02/16 0850  . magnesium hydroxide (MILK OF MAGNESIA) suspension 30 mL  30 mL Oral Daily PRN Shuvon B Rankin, NP      . methotrexate (RHEUMATREX) tablet 7.5 mg  7.5 mg Oral Once per day on Thu Fri Shuvon B Rankin, NP   7.5 mg at 06/02/16 0850  . predniSONE (DELTASONE) tablet 10 mg  10 mg Oral Q breakfast Shuvon B Rankin, NP   10 mg at 06/02/16 0850  . traMADol (ULTRAM) tablet 100 mg  100 mg Oral TID WC & HS Hildred Priest, MD   100 mg at 06/02/16 1743  . zolpidem (AMBIEN) tablet 5 mg  5 mg Oral QHS Costa Jha B Johntae Broxterman, MD   5 mg at 06/01/16 2134    Lab Results: No results found for this or any previous visit (from the past 48 hour(s)).  Blood Alcohol level:  Lab Results  Component Value Date   ETH <5 05/25/2016   ETH <5 03/70/4888    Metabolic Disorder Labs: No results found for: HGBA1C, MPG No results found for: PROLACTIN No results found for: CHOL, TRIG, HDL, CHOLHDL, VLDL, LDLCALC  Physical Findings: AIMS:  , ,  ,  ,    CIWA:    COWS:     Musculoskeletal: Strength & Muscle Tone: within normal limits Gait & Station: normal Patient leans: N/A  Psychiatric Specialty Exam: Physical Exam  Nursing note and vitals reviewed. Musculoskeletal: She exhibits deformity.    Review of Systems  Psychiatric/Behavioral: Positive for depression.  All other systems reviewed and are negative.   Blood pressure 109/65, pulse 72, temperature 98.7 F (37.1 C), temperature source Oral, resp. rate 18, height 5' 6"  (1.676 m), weight 49.9 kg (110 lb), SpO2 100 %.Body mass index is 17.75 kg/m.  General Appearance: Well Groomed  Eye Contact:  Good  Speech:  Clear and Coherent  Volume:  Normal  Mood:  Irritable  Affect:  Appropriate  Thought  Process:  Linear and Descriptions of Associations: Intact  Orientation:  Full (Time, Place, and Person)  Thought Content:  Hallucinations: None  Suicidal Thoughts:  No  Homicidal Thoughts:  No  Memory:  Immediate;   Good Recent;   Good Remote;   Good  Judgement:  Fair  Insight:  Shallow  Psychomotor Activity:  Normal  Concentration:  Concentration: Good and Attention Span: Good  Recall:  Good  Fund of Knowledge:  Good  Language:  Good  Akathisia:  No  Handed:    AIMS (if indicated):     Assets:  Agricultural consultant Housing  ADL's:  Intact  Cognition:  WNL  Sleep:  Number of Hours: 4.75     Treatment Plan Summary:  Ms. Kolander has a history of depression, chronic pain, and multiple medical problems admitted for suicidal ideation.  1. Suicidal ideation. This has resolved.  2. Major depressive disorder. We continued Cymbalta.  3. Insomnia. Trazodone was discontinued due to fears os serotonin syndrome. She is on Tramadol. Vistaril or Ambien were not helpful. We will offer Restoril tonight.  4. Chronic pain. We continued Tramadol and Lidoderm patches. Tramadol dose was confirmed by the [facility] controlled substance database.   5. Hypertension. We continued lisinopril.   6. Arthritis. We continued prednisone, methotrexate and folic acid.  7. Osteoporosis. We continued calcium and vitamin D.  8. Constipation. We continued Colace.   9. Disposition. She will be discharged to home. She will follow up with [facility].   Orson Slick, MD 06/02/2016, 6:21 PM

## 2016-06-02 NOTE — [facility] Suicide Risk Assessment (Signed)
[facility] INPATIENT:  Family/Significant Other Suicide Prevention Education  Suicide Prevention Education:  Contact Attempts: The pt's sister Tara Brown at ph: [phone], (name of family member/significant other) has been identified by the patient as the family member/significant other with whom the patient will be residing, and identified as the person(s) who will aid the patient in the event of a mental health crisis.  With written consent from the patient, two attempts were made to provide suicide prevention education, prior to and/or following the patient's discharge.  We were unsuccessful in providing suicide prevention education.  A suicide education pamphlet was given to the patient to share with family/significant other Date and time of first attempt: 06/02/16 at 2:40pm Date and time of second attempt 06/02/16 at 4pm  Tara Brown 06/02/2016, 2:39 PM

## 2016-06-03 NOTE — Progress Notes (Signed)
D: Observed pt in room. Patient alert and oriented x4. Patient denies SI/HI/AVH. Pt affect is anxious and depressed. Pt was tearful when talking with Clinical research associate. Pt worried about discharge, and doesn't know how she will get to her car in [place]. Pt changed mind about going to an assisted living facility because "I can't go to place like that yet." Pt rated depression 7/10 and anxiety 7/10. Pt is worried and uncertain about what she will do next. Pt stated "I don't think I'm ready to leave." A: Offered active listening and support. Provided therapeutic communication. Administered scheduled medications. Encouraged pt to discuss concerns with Child psychotherapist and doctor. Gave pt vistaril prn when pt c/o of difficulty sleeping. R: Pt pleasant and cooperative. Pt medication compliant. Will continue Q15 min. checks. Safety maintained.

## 2016-06-03 NOTE — Plan of Care (Addendum)
Problem: Activity: Goal: Interest or engagement in activities will improve Outcome: Not progressing Pt did not attend group this evening, and isolated to room more than previous nights.

## 2016-06-03 NOTE — [facility] Group Notes (Signed)
[facility] Group Notes:  (Nursing/MHT/Case Management/Adjunct)  Date:  06/03/2016  Time:  12:37 AM  Type of Therapy:  Evening Wrap-up Group  Participation Level:  Did Not Attend  Participation Quality:  N/A  Affect:  N/A  Cognitive:  N/A  Insight:  None  Engagement in Group:  Did Not Attend  Modes of Intervention:  Activity and Discussion  Summary of Progress/Problems:  Tara Brown 06/03/2016, 12:37 AM

## 2016-06-03 NOTE — Progress Notes (Signed)
Pt has been pleasant and cooperative this period. Pt denies SI,HI and A/V hallucinations . Pt states she is unsure whether she is ready for discharge. Pt's mood and affect has been depressed.

## 2016-06-03 NOTE — [facility] Group Notes (Signed)
[facility] LCSW Group Therapy  06/03/2016 2:46 PM  Type of Therapy:  Group Therapy  Participation Level:  Patient did not attend group. CSW invited patient to group.   Summary of Progress/Problems:Communications: Patients identify how individuals communicate with one another appropriately and inappropriately. Patients will be guided to discuss their thoughts, feelings, and behaviors related to barriers when communicating. The group will process together ways to execute positive and appropriate communications.   Tara Brown G. Garnette Czech MSW, LCSWA 06/03/2016, 2:46 PM

## 2016-06-03 NOTE — Progress Notes (Signed)
[facility] MD Progress Note  06/03/2016 1:21 PM Tara Brown  MRN:  [id number]  Subjective:   The patient is very tearful and depressed today after she found out that her sister was not willing to come pick her up from the hospital. She does not feel like her depression is any better and is reporting feelings of hopelessness on this. She denies any active or passive suicidal thoughts. She denies any psychotic symptoms including auditory or visual hallucinations. The patient says that she only slept 4 hours last night and remains tired today. She has been fairly isolative to her room but did attend some groups. Her primary stressor is limited mobility from arthritis. She is not completely comfortable with going home today and was somewhat resistant to discharge. She does not know how she will be able to get home to [facility] if her sister cannot pick her up. Per nursing, she has been calm and cooperative on the unit and there have been no behavioral disturbances. Appetite is good. She continues to be resistant to be transferred to a facility because she does not want to leave her cat. She also wants to be able to prepare her belongings.  Past Psychiatric History: Multiple psychiatric hospitalizations [place] since the summer of 2016. Carries a diagnosis of major depressive disorder. Recently discharged from [facility] after a suicidal attempt by taking 30 Ambien's.  Family Psychiatric  History: Mother deceased, father alive , has younger sisters, states father may be alcoholic, does not endorse mental illness in family, but a nephew committed suicide .Nephew was diagnosed with schizophrenia. Sister may have bipolar disorder.  Tobacco Screening: Have you used any form of tobacco in the last 30 days? (Cigarettes, Smokeless Tobacco, Cigars, and/or Pipes): No  Social History: Patient is divorced, has no children, is on disability, lives alone . No legal issues. As noted, major stressor at  this time is worsening ability to function independently due to progression of her rheumatologic disease .   Principal Problem: MDD (major depressive disorder), recurrent severe, without psychosis ([facility]) Diagnosis:   Patient Active Problem List   Diagnosis Date Noted  . Osteoporosis [M81.0] 05/29/2016  . HTN (hypertension) [I10] 05/29/2016  . MDD (major depressive disorder), recurrent severe, without psychosis (HCC) [F33.2] 05/28/2016  . Rheumatoid arthritis (HCC) [M06.9] 12/06/2015  . Anemia of chronic disease [D63.8] 03/08/2015   Total Time spent with patient: 30 minutes    Past Medical History:  Past Medical History:  Diagnosis Date  . Anemia   . Arthritis   . 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 Osteoporosis     Past Surgical History:  Procedure Laterality Date  . AMPUTATION Left 12/07/2015   Procedure: AMPUTATION LEFT GREAT TOE;  Surgeon: Nadara Mustard, MD;  Location: [facility];  Service: Orthopedics;  Laterality: Left;  . FOOT SURGERY    . HAND SURGERY    . HIP FRACTURE SURGERY    . KNEE SURGERY    . NECK SURGERY     Family History:  Family History  Problem Relation Age of Onset  . Bipolar disorder Sister   . Suicidality Other   . Schizophrenia Other   . Stroke Neg Hx    Family Psychiatric  History:  Social History:  History  Alcohol Use No     History  Drug Use No    Social History   Social History  . Marital status: Divorced    Spouse name: N/A  .  Number of children: N/A  . Years of education: N/A   Social History Main Topics  . Smoking status: Never Smoker  . Smokeless tobacco: Never Used  . Alcohol use No  . Drug use: No  . Sexual activity: No   Other Topics Concern  . None   Social History Narrative  . None    Current Medications: Current Facility-Administered Medications  Medication Dose Route Frequency Provider Last Rate Last Dose  . acetaminophen (TYLENOL) tablet 650 mg  650 mg  Oral Q6H PRN Shuvon B Rankin, NP      . alum & mag hydroxide-simeth (MAALOX/MYLANTA) 200-200-20 MG/5ML suspension 30 mL  30 mL Oral Q4H PRN Shuvon B Rankin, NP      . calcium-vitamin D (OSCAL WITH D) 500-200 MG-UNIT per tablet 1 tablet  1 tablet Oral BID Shuvon B Rankin, NP   1 tablet at 06/03/16 0811  . docusate sodium (COLACE) capsule 100 mg  100 mg Oral BID Jimmy Footman, MD   100 mg at 06/03/16 0811  . DULoxetine (CYMBALTA) DR capsule 60 mg  60 mg Oral Daily Shuvon B Rankin, NP   60 mg at 06/03/16 0811  . feeding supplement (ENSURE ENLIVE) (ENSURE ENLIVE) liquid 237 mL  237 mL Oral TID BM Jimmy Footman, MD   237 mL at 06/03/16 1302  . ferrous sulfate tablet 325 mg  325 mg Oral BID WC Shuvon B Rankin, NP   325 mg at 06/03/16 0812  . fluocinonide cream (LIDEX) 0.05 % 1 application  1 application Topical BID PRN Shuvon B Rankin, NP      . folic acid (FOLVITE) tablet 1 mg  1 mg Oral Daily Shuvon B Rankin, NP   1 mg at 06/03/16 0812  . hydrOXYzine (ATARAX/VISTARIL) tablet 50 mg  50 mg Oral QHS PRN Jimmy Footman, MD   50 mg at 06/02/16 2358  . lidocaine (LIDODERM) 5 % 2 patch  2 patch Transdermal Q24H Jimmy Footman, MD   2 patch at 06/02/16 1744  . lisinopril (PRINIVIL,ZESTRIL) tablet 40 mg  40 mg Oral Daily Shuvon B Rankin, NP   40 mg at 06/03/16 0811  . magnesium hydroxide (MILK OF MAGNESIA) suspension 30 mL  30 mL Oral Daily PRN Shuvon B Rankin, NP      . methotrexate (RHEUMATREX) tablet 7.5 mg  7.5 mg Oral Once per day on Thu Fri Shuvon B Rankin, NP   7.5 mg at 06/02/16 0850  . predniSONE (DELTASONE) tablet 10 mg  10 mg Oral Q breakfast Shuvon B Rankin, NP   10 mg at 06/03/16 0812  . temazepam (RESTORIL) capsule 15 mg  15 mg Oral QHS Shari Prows, MD   15 mg at 06/02/16 2207  . traMADol (ULTRAM) tablet 100 mg  100 mg Oral TID WC & HS Jimmy Footman, MD   100 mg at 06/03/16 1119    Lab Results: No results found for this or any  previous visit (from the past 48 hour(s)).  Blood Alcohol level:  Lab Results  Component Value Date   ETH <5 05/25/2016   ETH <5 04/23/2016    Metabolic Disorder Labs: No results found for: HGBA1C, MPG No results found for: PROLACTIN No results found for: CHOL, TRIG, HDL, CHOLHDL, VLDL, LDLCALC  Physical Findings: AIMS:  , ,  ,  ,    CIWA:    COWS:     Musculoskeletal: Strength & Muscle Tone: within normal limits Gait & Station: normal Patient leans: N/A  Psychiatric  Specialty Exam: Physical Exam  Nursing note and vitals reviewed. Musculoskeletal: She exhibits deformity.    Review of Systems  Constitutional: Negative.  Negative for chills, diaphoresis, fever, malaise/fatigue and weight loss.  HENT: Negative.  Negative for congestion, ear discharge, ear pain, hearing loss, nosebleeds, sore throat and tinnitus.   Eyes: Negative.  Negative for blurred vision, double vision, photophobia, pain and discharge.  Respiratory: Negative.  Negative for cough, hemoptysis, sputum production, shortness of breath and wheezing.   Cardiovascular: Negative.  Negative for chest pain, palpitations, orthopnea, claudication and leg swelling.  Gastrointestinal: Negative.  Negative for abdominal pain, constipation, diarrhea, heartburn, nausea and vomiting.  Genitourinary: Negative.  Negative for dysuria, frequency, hematuria and urgency.  Musculoskeletal:       The patient does have severe arthritis and joint pain from rheumatoid arthritis.  Skin: Negative.  Negative for itching and rash.  Neurological: Negative.  Negative for dizziness, tingling, tremors, sensory change, speech change, weakness and headaches.  Endo/Heme/Allergies: Negative for environmental allergies. Does not bruise/bleed easily.  Psychiatric/Behavioral: Positive for depression.  All other systems reviewed and are negative.   Blood pressure 133/70, pulse 61, temperature 98.1 F (36.7 C), resp. rate 18, height 5\' 6"  (1.676 m),  weight 49.9 kg (110 lb), SpO2 100 %.Body mass index is 17.75 kg/m.  General Appearance: Well Groomed  Eye Contact:  Good  Speech:  Clear and Coherent  Volume:  Normal  Mood:  Depressed  Affect:  Tearful and crying  Thought Process:  Linear and Descriptions of Associations: Intact  Orientation:  Full (Time, Place, and Person)  Thought Content:  Hallucinations: None  Suicidal Thoughts:  No  Homicidal Thoughts:  No  Memory:  Immediate;   Good Recent;   Good Remote;   Good  Judgement:  Fair  Insight:  Shallow  Psychomotor Activity:  Normal  Concentration:  Concentration: Good and Attention Span: Good  Recall:  Good  Fund of Knowledge:  Good  Language:  Good  Akathisia:  No  Handed:    AIMS (if indicated):     Assets:  Housing  ADL's:  Intact  Cognition:  WNL  Sleep:  Number of Hours: 4     Treatment Plan Summary:  [name] has a history of depression, chronic pain, and multiple medical problems admitted for suicidal ideation.  1. Suicidal ideation. This has resolved.  2. Major depressive disorder: For now, the patient will remain on Cymbalta 60 mg by mouth daily for anxiety and depression which was just started. She also has temazepam 15 mg by mouth nightly when necessary for insomnia and hydroxyzine 50 mg by mouth nightly prn  3. Insomnia. The patient will continue on temazepam 15 mg by mouth nightly and also has hydroxyzine 50 mg by mouth nightly when necessary if needed.  4. Chronic pain. We continued Tramadol and Lidoderm patches. Tramadol dose was confirmed by the [facility] controlled substance database. We'll continue tramadol 100 mg by mouth 3 times a day and at bedtime  5. Hypertension. Vital signs have been stable. We'll plan to continue lisinopril 40 mg by mouth daily  6. Arthritis. We continued prednisone 10 mg by mouth daily, methotrexate 7.5 mg by mouth daily on Thursdays and Fridays and folic acid  1 mg by mouth daily  7. Anemia: The patient will remain on ferrous sulfate 325 mg by mouth twice a day.  8. Osteoporosis. We continued calcium and vitamin D.  9. Constipation. We continued Colace 100 mg by  mouth twice a day  10. Disposition. She will likely be discharged to home. At this time, the patient has no transportation to get home. She feels uncomfortable with discharge today and was very depressed and tearful after finding out that her sister would not pick her up. There is a history of multiple attempts to place. Her Medicaid is pending. No facility agreed to accept the patient with her cat. PPD placed.   Levora Angel, MD 06/03/2016, 1:21 PM

## 2016-06-04 MED ORDER — TEMAZEPAM 15 MG PO CAPS
30.0000 mg | ORAL_CAPSULE | Freq: Every day | ORAL | Status: DC
  Administered 2016-06-04: 30 mg via ORAL
  Filled 2016-06-04: qty 2

## 2016-06-04 NOTE — [facility] Group Notes (Signed)
[facility] LCSW Group Therapy  06/04/2016 2:36 PM  Type of Therapy:  Group Therapy  Participation Level:  Patient did not attend group. CSW invited patient to group.   Summary of Progress/Problems:Self esteem: Patients discussed self esteem and how it impacts them. They discussed what aspects in their lives has influenced their self esteem. They were challenged to identify changes that are needed in order to improve self esteem. Patients participated in activity where they had to identify positive adjectives they felt described their personality. Patients shared with the group on the following areas: Things I am good at, What I like about my appearance, I've helped others by, What I value the most, compliments I have received, challenges I have overcome, thing that make me unique, and Times I've made others happy.   Tara Brown G. Garnette Czech MSW, LCSWA 06/04/2016, 2:37 PM

## 2016-06-04 NOTE — Progress Notes (Signed)
D: Patient denies SI/HI/AVH.  Patient affect is depressed. Mood is anxious.  Patient did attend evening group. Patient visible on the milieu. No distress noted. A: Support and encouragement offered. Scheduled medications given to pt. Q 15 min checks continued for patient safety. R: Patient receptive. Patient remains safe on the unit.   

## 2016-06-04 NOTE — [facility] Group Notes (Signed)
[facility] Group Notes:  (Nursing/MHT/Case Management/Adjunct)  Date:  06/04/2016  Time:  4:16 AM  Type of Therapy:  Psychoeducational Skills  Participation Level:  Active  Participation Quality:  Appropriate, Attentive and Sharing  Affect:  Appropriate  Cognitive:  Appropriate  Insight:  Appropriate  Engagement in Group:  Engaged  Modes of Intervention:  Discussion, Socialization and Support  Summary of Progress/Problems:  Tara Brown 06/04/2016, 4:16 AM

## 2016-06-04 NOTE — Progress Notes (Signed)
[facility] MD Progress Note  06/04/2016 1:27 PM Tara Brown  MRN:  [id number]  Subjective:   The patient was very sad and tearful today when talking about conflict with her sisters. She feels like her sisters are very independent and looked down on her and criticizes her. She has very few friends in the community and reports constant feelings of sadness regarding limited mobility and chronic multiple medical problems. She did not go to any groups and has been fairly isolative. She denies any current active or passive suicidal thoughts but remains hopeless and depressed. She denies any auditory or visual hallucinations. No paranoid thoughts or delusions. No sleep hours recorded. She only slept 3 hours the night before. She has however been napping during the daytime. Appetite is fair. Vital signs are stable. She was encouraged to try and get out of her room today and to interact with other patients.  Supportive psychotherapy provided and Times in helping the patient to try and build feelings of self-worth and self-esteem. Times spent encouraging the patient to try and interact with other patients on the unit and to attend groups. Time also spent doing some cognitive behavioral therapy discussing cognitive distortions that contribute to negative thoughts about self. Times spent helping the patient to address "all or nothing thinking" and personalization secondary to the arthritis.  Time spent again reinforcing that Cymbalta may take several weeks to reach maximum benefit and therapeutic benefit.  The patient refused to give her sisters contact information to this Clinical research associate or the Child psychotherapist.  Past Psychiatric History: [place] psychiatric hospital[place] since the summer of 2016. Carries a diagnosis of major depressive disorder. Recently discharged from [facility] behavioral health after a suicidal attempt by taking 30 Ambien's.  Family Psychiatric  History: Mother deceased, father alive , has  younger sisters, states father may be alcoholic, does not endorse mental illness in family, but a nephew committed suicide .Nephew was diagnosed with schizophrenia. Sister may have bipolar disorder.  Tobacco Screening: Have you used any form of tobacco in the last 30 days? (Cigarettes, Smokeless Tobacco, Cigars, and/or Pipes): No   Social History: Patient is divorced, has no children, is on disability, lives alone . No legal issues. As noted, major stressor at this time is worsening ability to function independently due to progression of her rheumatologic disease .   Principal Problem: MDD (major depressive disorder), recurrent severe, without psychosis (HCC) Diagnosis:   Patient Active Problem List   Diagnosis Date Noted  . Osteoporosis [M81.0] 05/29/2016  . HTN (hypertension) [I10] 05/29/2016  . MDD (major depressive disorder), recurrent severe, without psychosis (HCC) [F33.2] 05/28/2016  . Rheumatoid arthritis (HCC) [M06.9] 12/06/2015  . Anemia of chronic disease [D63.8] 03/08/2015   Total Time spent with patient: 30 minutes    Past Medical History:  Past Medical History:  Diagnosis Date  . Anemia   . Arthritis   . 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 Osteoporosis     Past Surgical History:  Procedure Laterality Date  . AMPUTATION Left 12/07/2015   Procedure: AMPUTATION LEFT GREAT TOE;  Surgeon: Nadara Mustard, MD;  Location: [facility];  Service: Orthopedics;  Laterality: Left;  . FOOT SURGERY    . HAND SURGERY    . HIP FRACTURE SURGERY    . KNEE SURGERY    . NECK SURGERY     Family History:  Family History  Problem Relation Age of Onset  . Bipolar disorder Sister   . Suicidality  Other   . Schizophrenia Other   . Stroke Neg Hx    Family Psychiatric  History:  Social History:  History  Alcohol Use No     History  Drug Use No    Social History   Social History  . Marital status: Divorced    Spouse name: N/A   . Number of children: N/A  . Years of education: N/A   Social History Main Topics  . Smoking status: Never Smoker  . Smokeless tobacco: Never Used  . Alcohol use No  . Drug use: No  . Sexual activity: No   Other Topics Concern  . None   Social History Narrative  . None    Current Medications: Current Facility-Administered Medications  Medication Dose Route Frequency Provider Last Rate Last Dose  . acetaminophen (TYLENOL) tablet 650 mg  650 mg Oral Q6H PRN Shuvon B Rankin, NP      . alum & mag hydroxide-simeth (MAALOX/MYLANTA) 200-200-20 MG/5ML suspension 30 mL  30 mL Oral Q4H PRN Shuvon B Rankin, NP      . calcium-vitamin D (OSCAL WITH D) 500-200 MG-UNIT per tablet 1 tablet  1 tablet Oral BID Shuvon B Rankin, NP   1 tablet at 06/04/16 0830  . docusate sodium (COLACE) capsule 100 mg  100 mg Oral BID Jimmy FootmanAndrea Hernandez-Gonzalez, MD   100 mg at 06/04/16 0829  . DULoxetine (CYMBALTA) DR capsule 60 mg  60 mg Oral Daily Shuvon B Rankin, NP   60 mg at 06/04/16 0830  . feeding supplement (ENSURE ENLIVE) (ENSURE ENLIVE) liquid 237 mL  237 mL Oral TID BM Jimmy FootmanAndrea Hernandez-Gonzalez, MD   237 mL at 06/04/16 0959  . ferrous sulfate tablet 325 mg  325 mg Oral BID WC Shuvon B Rankin, NP   325 mg at 06/04/16 0830  . fluocinonide cream (LIDEX) 0.05 % 1 application  1 application Topical BID PRN Shuvon B Rankin, NP      . folic acid (FOLVITE) tablet 1 mg  1 mg Oral Daily Shuvon B Rankin, NP   1 mg at 06/04/16 0830  . hydrOXYzine (ATARAX/VISTARIL) tablet 50 mg  50 mg Oral QHS PRN Jimmy FootmanAndrea Hernandez-Gonzalez, MD   50 mg at 06/02/16 2358  . lidocaine (LIDODERM) 5 % 2 patch  2 patch Transdermal Q24H Jimmy FootmanAndrea Hernandez-Gonzalez, MD   2 patch at 06/03/16 1647  . lisinopril (PRINIVIL,ZESTRIL) tablet 40 mg  40 mg Oral Daily Shuvon B Rankin, NP   40 mg at 06/04/16 0830  . magnesium hydroxide (MILK OF MAGNESIA) suspension 30 mL  30 mL Oral Daily PRN Shuvon B Rankin, NP      . methotrexate (RHEUMATREX) tablet 7.5 mg   7.5 mg Oral Once per day on Thu Fri Shuvon B Rankin, NP   7.5 mg at 06/02/16 0850  . predniSONE (DELTASONE) tablet 10 mg  10 mg Oral Q breakfast Shuvon B Rankin, NP   10 mg at 06/04/16 0829  . temazepam (RESTORIL) capsule 15 mg  15 mg Oral QHS Shari ProwsJolanta B Pucilowska, MD   15 mg at 06/03/16 2135  . traMADol (ULTRAM) tablet 100 mg  100 mg Oral TID WC & HS Jimmy FootmanAndrea Hernandez-Gonzalez, MD   100 mg at 06/04/16 1144    Lab Results: No results found for this or any previous visit (from the past 48 hour(s)).  Blood Alcohol level:  Lab Results  Component Value Date   [facility] <5 05/25/2016   ETH <5 04/23/2016    Metabolic Disorder Labs: No results  found for: HGBA1C, MPG No results found for: PROLACTIN No results found for: CHOL, TRIG, HDL, CHOLHDL, VLDL, LDLCALC  Physical Findings: AIMS:  , ,  ,  ,    CIWA:    COWS:     Musculoskeletal: Strength & Muscle Tone: within normal limits Gait & Station: normal Patient leans: N/A  Psychiatric Specialty Exam: Physical Exam  Nursing note and vitals reviewed. Musculoskeletal: She exhibits deformity.    Review of Systems  Constitutional: Negative.  Negative for chills, diaphoresis, fever, malaise/fatigue and weight loss.  HENT: Negative.  Negative for congestion, ear discharge, ear pain, hearing loss, nosebleeds, sore throat and tinnitus.   Eyes: Negative.  Negative for blurred vision, double vision, photophobia, pain and discharge.  Respiratory: Negative.  Negative for cough, hemoptysis, sputum production, shortness of breath and wheezing.   Cardiovascular: Negative.  Negative for chest pain, palpitations, orthopnea, claudication and leg swelling.  Gastrointestinal: Negative.  Negative for abdominal pain, constipation, diarrhea, heartburn, nausea and vomiting.  Genitourinary: Negative.  Negative for dysuria, frequency, hematuria and urgency.  Musculoskeletal:       The patient does have severe arthritis and joint pain from rheumatoid arthritis.   Skin: Negative.  Negative for itching and rash.  Neurological: Negative.  Negative for dizziness, tingling, tremors, sensory change, speech change, weakness and headaches.  Endo/Heme/Allergies: Negative for environmental allergies. Does not bruise/bleed easily.  Psychiatric/Behavioral: Positive for depression.  All other systems reviewed and are negative.   Blood pressure 122/82, pulse 66, temperature 97.9 F (36.6 C), temperature source Oral, resp. rate 18, height 5\' 6"  (1.676 m), weight 49.9 kg (110 lb), SpO2 99 %.Body mass index is 17.75 kg/m.  General Appearance: Well Groomed  Eye Contact:  Good  Speech:  Clear and Coherent  Volume:  Normal  Mood:  Depressed  Affect:  Depressed and crying  Thought Process:  Linear and Descriptions of Associations: Intact  Orientation:  Full (Time, Place, and Person)  Thought Content:  Hallucinations: None  Suicidal Thoughts:  No  Homicidal Thoughts:  No  Memory:  Immediate;   Good Recent;   Good Remote;   Good  Judgement:  Fair  Insight:  Shallow  Psychomotor Activity:  Normal  Concentration:  Concentration: Good and Attention Span: Good  Recall:  Good  Fund of Knowledge:  Good  Language:  Good  Akathisia:  No  Handed:    AIMS (if indicated):     Assets:  Housing  ADL's:  Intact  Cognition:  WNL  Sleep:  Number of Hours: 3.25     Treatment Plan Summary:  Ms. Vigorito has a history of depression, chronic pain, and multiple medical problems admitted for suicidal ideation.  1. Suicidal ideation. This has resolved.  2. Major depressive disorder: For now, the patient will remain on Cymbalta 60 mg by mouth daily for anxiety and depression which was just started. She also has temazepam 15 mg by mouth nightly when necessary for insomnia and hydroxyzine 50 mg by mouth nightly prn  3. Insomnia. The patient will continue on temazepam 15 mg by mouth nightly and also has hydroxyzine 50 mg by  mouth nightly when necessary if needed.  4. Chronic pain. We continued Tramadol and Lidoderm patches. Tramadol dose was confirmed by the [facility] controlled substance database. We'll continue tramadol 100 mg by mouth 3 times a day and at bedtime  5. Hypertension. Vital signs have been stable. We'll plan to continue lisinopril 40 mg by mouth daily  6.  Arthritis. We continued prednisone 10 mg by mouth daily, methotrexate 7.5 mg by mouth daily on Thursdays and Fridays and folic acid 1 mg by mouth daily  7. Anemia: The patient will remain on ferrous sulfate 325 mg by mouth twice a day.  8. Osteoporosis. We continued calcium and vitamin D.  9. Constipation. We continued Colace 100 mg by mouth twice a day  10. Disposition. She will likely be discharged to home. At this time, the patient has no transportation to get home. She is refusing to give her sister's phone number for either this Clinical research associate or the social worker to contact and feels uncomfortable going home today. The social worker reports that she does not have a taxi voucher for the patient for today. There is a history of multiple attempts to place. Her Medicaid is pending. No facility agreed to accept the patient with her cat. PPD placed.   Levora Angel, MD 06/04/2016, 1:27 PM

## 2016-06-04 NOTE — Progress Notes (Signed)
Pt has been sad and seclusive to her room. Pt denies si and A/V hallucinations. Pt has not been attending any unit activities. Pt's mood and affect has been depressed.P t completed her self inventory rating her self 5/10 for depression , 4/10 hopelessness and 3/10 for anxiety . Pt c/o being tried and staying in bed except for meals and medication.Will continue to encourage more unit participation.

## 2016-06-05 MED ORDER — TEMAZEPAM 30 MG PO CAPS: 30.0000 mg | ORAL_CAPSULE | Freq: Every day | ORAL | 0 refills | Status: DC

## 2016-06-05 NOTE — [facility] Group Notes (Signed)
[facility] LCSW Group Therapy   06/05/2016 9:30 am Type of Therapy: Group Therapy   Participation Level: Pt invited but did not attend.  Participation Quality: Pt invited but did not attend.    Hampton Abbot, MSW, LCSWA 06/05/2016, 11:56AM

## 2016-06-05 NOTE — Tx Team (Signed)
Interdisciplinary Treatment and Diagnostic Plan Update  06/05/2016 Time of Session: 3:40 PM  Tara Brown MRN: [id number]  Principal Diagnosis: MDD (major depressive disorder), recurrent severe, without psychosis (HCC)  Secondary Diagnoses: Principal Problem:   MDD (major depressive disorder), recurrent severe, without psychosis (HCC) Active Problems:   Anemia of chronic disease   Rheumatoid arthritis (HCC)   Osteoporosis   HTN (hypertension)   Current Medications:  Current Facility-Administered Medications  Medication Dose Route Frequency Provider Last Rate Last Dose  . acetaminophen (TYLENOL) tablet 650 mg  650 mg Oral Q6H PRN Shuvon B Rankin, NP      . alum & mag hydroxide-simeth (MAALOX/MYLANTA) 200-200-20 MG/5ML suspension 30 mL  30 mL Oral Q4H PRN Shuvon B Rankin, NP      . calcium-vitamin D (OSCAL WITH D) 500-200 MG-UNIT per tablet 1 tablet  1 tablet Oral BID Shuvon B Rankin, NP   1 tablet at 06/05/16 0829  . docusate sodium (COLACE) capsule 100 mg  100 mg Oral BID Jimmy Footman, MD   100 mg at 06/05/16 0829  . DULoxetine (CYMBALTA) DR capsule 60 mg  60 mg Oral Daily Shuvon B Rankin, NP   60 mg at 06/05/16 0829  . feeding supplement (ENSURE ENLIVE) (ENSURE ENLIVE) liquid 237 mL  237 mL Oral TID BM Jimmy Footman, MD   237 mL at 06/04/16 2000  . ferrous sulfate tablet 325 mg  325 mg Oral BID WC Shuvon B Rankin, NP   325 mg at 06/05/16 0829  . fluocinonide cream (LIDEX) 0.05 % 1 application  1 application Topical BID PRN Shuvon B Rankin, NP      . folic acid (FOLVITE) tablet 1 mg  1 mg Oral Daily Shuvon B Rankin, NP   1 mg at 06/05/16 0829  . hydrOXYzine (ATARAX/VISTARIL) tablet 50 mg  50 mg Oral QHS PRN Jimmy Footman, MD   50 mg at 06/02/16 2358  . lidocaine (LIDODERM) 5 % 2 patch  2 patch Transdermal Q24H Jimmy Footman, MD   2 patch at 06/04/16 1617  . lisinopril (PRINIVIL,ZESTRIL) tablet 40 mg  40 mg Oral Daily Shuvon B Rankin,  NP   40 mg at 06/05/16 0829  . magnesium hydroxide (MILK OF MAGNESIA) suspension 30 mL  30 mL Oral Daily PRN Shuvon B Rankin, NP      . methotrexate (RHEUMATREX) tablet 7.5 mg  7.5 mg Oral Once per day on Thu Fri Shuvon B Rankin, NP   7.5 mg at 06/02/16 0850  . predniSONE (DELTASONE) tablet 10 mg  10 mg Oral Q breakfast Shuvon B Rankin, NP   10 mg at 06/05/16 0828  . temazepam (RESTORIL) capsule 30 mg  30 mg Oral QHS Shari Prows, MD   30 mg at 06/04/16 2130  . traMADol (ULTRAM) tablet 100 mg  100 mg Oral TID WC & HS Jimmy Footman, MD   100 mg at 06/05/16 3329   Current Outpatient Prescriptions  Medication Sig Dispense Refill  . calcium-vitamin D (OSCAL WITH D) 500-200 MG-UNIT tablet Take 1 tablet by mouth 2 (two) times daily.    . DULoxetine (CYMBALTA) 60 MG capsule Take 1 capsule (60 mg total) by mouth daily. 30 capsule 0  . ferrous sulfate 325 (65 FE) MG tablet Take 1 tablet (325 mg total) by mouth 2 (two) times daily with a meal. 30 tablet 0  . fluocinonide cream (LIDEX) 0.05 % Apply 1 application topically 2 (two) times daily as needed (for psorasis).    . folic  acid (FOLVITE) 1 MG tablet Take 1 mg by mouth daily.  0  . HUMIRA PEN 40 MG/0.8ML PNKT Inject 40 mg into the skin every 14 (fourteen) days.  0  . hydrOXYzine (ATARAX/VISTARIL) 50 MG tablet Take 1 tablet (50 mg total) by mouth at bedtime as needed (insomnia). 90 tablet 0  . lidocaine (LIDODERM) 5 % Place 2 patches onto the skin daily. Remove & Discard patch within 12 hours or as directed by MD 30 patch 0  . lisinopril (PRINIVIL,ZESTRIL) 40 MG tablet Take 40 mg by mouth daily.    . methotrexate (RHEUMATREX) 2.5 MG tablet Take 3 tablets (7.5 mg total) by mouth 2 (two) times a week. Thursday and Friday 4 tablet 0  . predniSONE (DELTASONE) 10 MG tablet Take 1 tablet (10 mg total) by mouth daily with breakfast. 30 tablet 0  . temazepam (RESTORIL) 30 MG capsule Take 1 capsule (30 mg total) by mouth at bedtime. 30 capsule  0  . traMADol (ULTRAM) 50 MG tablet Take 1 tablet (50 mg total) by mouth every 6 (six) hours as needed for moderate pain or severe pain. 30 tablet 0    PTA Medications: No prescriptions prior to admission.    Treatment Modalities: Medication Management, Group therapy, Case management,  1 to 1 session with clinician, Psychoeducation, Recreational therapy.   Physician Treatment Plan for Primary Diagnosis: MDD (major depressive disorder), recurrent severe, without psychosis (HCC) Long Term Goal(s): Improvement in symptoms so as ready for discharge  Short Term Goals: Ability to identify changes in lifestyle to reduce recurrence of condition will improve, Ability to verbalize feelings will improve, Ability to disclose and discuss suicidal ideas, Ability to identify and develop effective coping behaviors will improve and Ability to identify triggers associated with substance abuse/mental health issues will improve  Medication Management: Evaluate patient's response, side effects, and tolerance of medication regimen.  Therapeutic Interventions: 1 to 1 sessions, Unit Group sessions and Medication administration.  Evaluation of Outcomes: Adequate for discharge  Physician Treatment Plan for Secondary Diagnosis: Principal Problem:   MDD (major depressive disorder), recurrent severe, without psychosis (HCC) Active Problems:   Anemia of chronic disease   Rheumatoid arthritis (HCC)   Osteoporosis   HTN (hypertension)   Long Term Goal(s): Improvement in symptoms so as ready for discharge  Short Term Goals: Ability to identify changes in lifestyle to reduce recurrence of condition will improve and Ability to identify triggers associated with substance abuse/mental health issues will improve  Medication Management: Evaluate patient's response, side effects, and tolerance of medication regimen.  Therapeutic Interventions: 1 to 1 sessions, Unit Group sessions and Medication  administration.  Evaluation of Outcomes: Adequate for discharge   RN Treatment Plan for Primary Diagnosis: MDD (major depressive disorder), recurrent severe, without psychosis (HCC) Long Term Goal(s): Knowledge of disease and therapeutic regimen to maintain health will improve  Short Term Goals: Ability to remain free from injury will improve, Ability to verbalize frustration and anger appropriately will improve, Ability to participate in decision making will improve, Ability to identify and develop effective coping behaviors will improve and Compliance with prescribed medications will improve  Medication Management: RN will administer medications as ordered by provider, will assess and evaluate patient's response and provide education to patient for prescribed medication. RN will report any adverse and/or side effects to prescribing provider.  Therapeutic Interventions: 1 on 1 counseling sessions, Psychoeducation, Medication administration, Evaluate responses to treatment, Monitor vital signs and CBGs as ordered, Perform/monitor CIWA, COWS, AIMS and Fall Risk  screenings as ordered, Perform wound care treatments as ordered.  Evaluation of Outcomes: Adequate for discharge   LCSW Treatment Plan for Primary Diagnosis: MDD (major depressive disorder), recurrent severe, without psychosis (HCC) Long Term Goal(s): Safe transition to appropriate next level of care at discharge, Engage patient in therapeutic group addressing interpersonal concerns.  Short Term Goals: Engage patient in aftercare planning with referrals and resources, Increase social support, Facilitate acceptance of mental health diagnosis and concerns and Increase skills for wellness and recovery  Therapeutic Interventions: Assess for all discharge needs, 1 to 1 time with Social worker, Explore available resources and support systems, Assess for adequacy in community support network, Educate family and significant other(s) on suicide  prevention, Complete Psychosocial Assessment, Interpersonal group therapy.  Evaluation of Outcomes: Adequate for discharge    Progress in Treatment: Attending groups: Intermittently Participating in groups: Intermittently Taking medication as prescribed: Yes, MD continues to assess for medication changes as needed Toleration medication: Yes, no side effects reported at this time Family/Significant other contact made: CSW, still assessing for appropriate contacts Patient understands diagnosis: Yes Discussing patient identified problems/goals with staff: Yes Medical problems stabilized or resolved:  Yes  Denies suicidal/homicidal ideation: Yes Issues/concerns per patient self-inventory: None Other: N/A  New problem(s) identified: None identified at this time.   New Short Term/Long Term Goal(s): None identified at this time.   Discharge Plan or Barriers: Still assessing Reason for Continuation of Hospitalization: Anxiety Depression Suicidal ideation   Estimated date of discharge: 06/05/16  Attendees: Patient:  06/05/2016  3:34 PM  Physician: Dr. Kristine Linea 06/05/2016  3:34 PM  Nursing: Hulan Amato, RN 06/05/2016  3:34 PM  RN Care Manager: 06/05/2016  3:34 PM  Social Worker: York Grice, MSW, LCSW-A   06/05/2016  3:34 PM  Recreational Therapist: Hershal Coria, LRT 06/05/2016  3:34 PM    Scribe for Treatment Team: York Grice, MSW, LCSW-A

## 2016-06-05 NOTE — Care Management Note (Signed)
Case Management Note  Patient Details  Name: Tara Brown MRN: [id number] Date of Birth: [birth year]  Subjective/Objective:   Home health referral                 Action/Plan: Ellyn Hack at [facility]   Expected Discharge Date:   06/05/16               Expected Discharge Plan:    [facility]  In-House Referral:    Yes  Discharge planning Services     Post Acute Care Choice:   [facility] Choice offered to:   Tara Brown  DME Arranged:    DME Agency:     HH Arranged:   YES HH Agency:   [facility]  Status of Service:   PENDING APPROVAL  If discussed at Long Length of Stay Meetings, dates discussed:  05/1916  Additional Comments:  Tara Spurling, RN 06/05/2016, 11:33 AM

## 2016-06-05 NOTE — BHH Suicide Risk Assessment (Signed)
[facility] Discharge Suicide Risk Assessment   Principal Problem: MDD (major depressive disorder), recurrent severe, without psychosis (HCC) Discharge Diagnoses:  Patient Active Problem List   Diagnosis Date Noted  . Osteoporosis [M81.0] 05/29/2016  . HTN (hypertension) [I10] 05/29/2016  . MDD (major depressive disorder), recurrent severe, without psychosis (HCC) [F33.2] 05/28/2016  . Rheumatoid arthritis (HCC) [M06.9] 12/06/2015  . Anemia of chronic disease [D63.8] 03/08/2015    Total Time spent with patient: 30 minutes  Musculoskeletal: Strength & Muscle Tone: abnormal Gait & Station: unsteady Patient leans: N/A  Psychiatric Specialty Exam: Review of Systems  Psychiatric/Behavioral: Positive for depression. The patient is nervous/anxious.   All other systems reviewed and are negative.   Blood pressure 122/82, pulse 66, temperature 97.9 F (36.6 C), temperature source Oral, resp. rate 18, height 5\' 6"  (1.676 m), weight 49.9 kg (110 lb), SpO2 99 %.Body mass index is 17.75 kg/m.  General Appearance: Casual  Eye Contact::  Good  Speech:  Clear and Coherent409  Volume:  Normal  Mood:  Anxious  Affect:  Appropriate  Thought Process:  Goal Directed and Descriptions of Associations: Intact  Orientation:  Full (Time, Place, and Person)  Thought Content:  WDL  Suicidal Thoughts:  No  Homicidal Thoughts:  No  Memory:  Immediate;   Fair Recent;   Fair Remote;   Fair  Judgement:  Impaired  Insight:  Present  Psychomotor Activity:  Decreased  Concentration:  Fair  Recall:  [id number] of Knowledge:Fair  Language: Fair  Akathisia:  No  Handed:  Right  AIMS (if indicated):     Assets:  Communication Skills Desire for Improvement Financial Resources/Insurance Housing Resilience Transportation  Sleep:  Number of Hours: 3.25  Cognition: WNL  ADL's:  Impaired   Mental Status Per Nursing Assessment::   On Admission:     Demographic Factors:  Caucasian and Living alone  Loss  Factors: Decline in physical health  Historical Factors: Prior suicide attempts and Impulsivity  Risk Reduction Factors:   Sense of responsibility to family  Continued Clinical Symptoms:  Depression:   Hopelessness Impulsivity Insomnia Medical Diagnoses and Treatments/Surgeries  Cognitive Features That Contribute To Risk:  None    Suicide Risk:  Minimal: No identifiable suicidal ideation.  Patients presenting with no risk factors but with morbid ruminations; may be classified as minimal risk based on the severity of the depressive symptoms  Follow-up Information    [facility] .        [facility]   Specialty:  Behavioral Health Why:  Please arrive to the walk-in clinic Monday through Friday from 9-4pm for your assessment for your hospital follow up for medication management and therapy Contact information: [address] [place] [phone]           Plan Of Care/Follow-up recommendations:  Activity:  as tolerated. Diet:  low sodium heart healthy. Other:  keep follow up appointmnts.  [phone], MD 06/05/2016, 8:36 AM

## 2016-06-05 NOTE — Progress Notes (Signed)
  [facility] Adult Case Management Discharge Plan :  Will you be returning to the same living situation after discharge:  Yes,  pt will be returning to her home in [facility] At discharge, do you have transportation home?: Yes,  pt will be provided with a taxi Do you have the ability to pay for your medications: Yes,  pt will be provided with prescriptions at discharge  Release of information consent forms completed and in the chart;  Patient's signature needed at discharge.  Patient to Follow up at: Follow-up Information    [facility] .        [facility]   Specialty:  Behavioral Health Why:  Please arrive to the walk-in clinic Monday through Friday from 9-4pm for your assessment for your hospital follow up for medication management and therapy Contact information: [address] [place] [address] [phone]           Next level of care provider has access to [facility] Link:no  Safety Planning and Suicide Prevention discussed: Yes,  completed with pt  Have you used any form of tobacco in the last 30 days? (Cigarettes, Smokeless Tobacco, Cigars, and/or Pipes): No  Has patient been referred to the Quitline?: N/A patient is not a smoker  Patient has been referred for addiction treatment: N/A  Mercy Riding 06/05/2016, 3:46 PM

## 2016-06-05 NOTE — Progress Notes (Signed)
Patient discharged home. DC instructions provided and explained. Medications reviewed. Rx given. All questions answered. Pt stable at discharge, denies SI, HI, AVH.

## 2016-06-05 NOTE — Discharge Summary (Signed)
Physician Discharge Summary Note  Patient:  Tara Brown is an 55 y.o., female MRN:  [id number] DOB:  [birth year] Patient phone:  [phone] (home)  Patient address:   [address] [place] [address],  Total Time spent with patient: 30 minutes  Date of Admission:  05/28/2016 Date of Discharge: 06/05/2016  Reason for Admission:  Depression.  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 This patient has had a multitude of psychiatric hospitalizations since 2016. She was here in June 2016 due to severe depression and suicidality. After that the patient was hospitalized at behavioral health in [place] in July, twice in November, and twice in December. She was just discharged from behavioral health  on September 9 after being there for 23 days.  Patient is basically stating that the deformity on both of her hands is impairing her ability to care for self. Patient says that every single activity of daily living is a significant challenge for her. She tells me that these has caused severe depression and she has been having suicidal ideation. During her past hospitalization at behavioral health in [place] asocial worker there try to place her in a assisted living facility however the patient doesn't have Medicaid and after 23 days that were unable to find placement for her. The patient was discharged back home. Patient states that family has not offered her to allow her to stay with them and she does not have the means to pay for somebody to come and help her around the house.  Patient also reports that she basically supports herself with disability and sometimes there is not enough for her to buy food for seeing doctors doctors. The only provider she has at this time is a Publishing rights manager that provides her with primary care.  Patient was seen in the emergency  department at [facility] on September 14 and she continued reporting self-care deficits due to her physical limitations. While inthe emergency room the social worker was consulted and they place orders for home health and Meals on Wheels but later on patient reported suicidality and they consulted psychiatry. She reported that somebody told her that if she came to the hospital she will have a better chance to replace. She stated that she just needed help getting into an assisted living facility.  Patient is trying to request to be transferred today to calmly her brother as she said the social worker there was not handling her case and knew how to get her into an assisted living without medicaid.  Today the patient reported having fleeting suicidal thoughts but no plan. She continues to report severe depression as a result of her physical limitations. She denies homicidality or auditory or visual hallucinations.  Substance abuse denies.  Associated Signs/Symptoms: Depression Symptoms: depressed mood, fatigue, hopelessness, (Hypo) Manic Symptoms: denies Anxiety Symptoms: Excessive Worry, Psychotic Symptoms:denies PTSD Symptoms: NA  Past Psychiatric History: Multiple psychiatric hospitalizations [place] since the summer of 2016. Carries a diagnosis of major depressive disorder. Recently discharged from [facility] behavioral health after a suicidal attempt by taking 30 Ambien's.  Family Psychiatric History: Mother deceased, father alive , has younger sisters, states father may be alcoholic, does not endorse mental illness in family, but a nephew committed suicide .Nephew was diagnosed with schizophrenia. Sister may have bipolar disorder.  Social History:Patient is divorced, has no children, is on disability, lives alone . No legal issues. As noted, major stressor  at this time is worsening ability to function independently due to progression of her rheumatologic disease .  Principal  Problem: MDD (major depressive disorder), recurrent severe, without psychosis [facility](HCC) Discharge Diagnoses: Patient Active Problem List   Diagnosis Date Noted  . Osteoporosis [M81.0] 05/29/2016  . HTN (hypertension) [I10] 05/29/2016  . MDD (major depressive disorder), recurrent severe, without psychosis (HCC) [F33.2] 05/28/2016  . Rheumatoid arthritis (HCC) [M06.9] 12/06/2015  . Anemia of chronic disease [D63.8] 03/08/2015    Past Medical History:  Past Medical History:  Diagnosis Date  . Anemia   . Arthritis   . 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 Osteoporosis     Past Surgical History:  Procedure Laterality Date  . AMPUTATION Left 12/07/2015   Procedure: AMPUTATION LEFT GREAT TOE;  Surgeon: Nadara MustardMarcus Duda V, MD;  Location: [facility] ORS;  Service: Orthopedics;  Laterality: Left;  . FOOT SURGERY    . HAND SURGERY    . HIP FRACTURE SURGERY    . KNEE SURGERY    . NECK SURGERY     Family History:  Family History  Problem Relation Age of Onset  . Bipolar disorder Sister   . Suicidality Other   . Schizophrenia Other   . Stroke Neg Hx    Social History:  History  Alcohol Use No     History  Drug Use No    Social History   Social History  . Marital status: Divorced    Spouse name: N/A  . Number of children: N/A  . Years of education: N/A   Social History Main Topics  . Smoking status: Never Smoker  . Smokeless tobacco: Never Used  . Alcohol use No  . Drug use: No  . Sexual activity: No   Other Topics Concern  . None   Social History Narrative  . None    Hospital Course:    [name] has a history of depression, chronic pain, and multiple medical problems admitted for suicidal ideation.  1. Suicidal ideation. This has resolved. The patient is able to contract for safety. She is forward thinking and more optimistic about the future.   2. Major depressive disorder: We continued Cymbalta for anxiety and  depression, and hydroxyzine for anxiety.   3. Insomnia. She responded to a high dose of Restoril. Trazodone was discontinued due to interaction with Tramadol.   4. Chronic pain. We continued Tramadol and Lidoderm patches. Tramadol dose was [facility] the [facility] substance [facility]. We'll continue tramadol 100 mg by mouth 3 times a day and at bedtime  5. Hypertension. Vital signs have been stable. We'll plan to continue lisinopril 40 mg by mouth daily  6. Arthritis. We continued prednisone 10 mg by mouth daily, methotrexate 7.5 mg by mouth daily on Thursdays and Fridays and folic acid 1 mg by mouth daily  7. Anemia: The patient will remain on ferrous sulfate 325 mg by mouth twice a day.  8. Osteoporosis. We continued calcium and vitamin D.  9. Constipation. We continued Colace 100 mg by mouth twice a day  10. Disposition. She was discharged to home.She refused placement in family care home. Hopefully she will work with PT, OT and home health. She will follow up with [facility].    Physical Findings: AIMS:  , ,  ,  ,    CIWA:    COWS:     Musculoskeletal: Strength & Muscle Tone: abnormal Gait & Station: unsteady Patient leans: N/A  Psychiatric Specialty Exam: Physical Exam  Nursing note and vitals reviewed.   Review of Systems  Psychiatric/Behavioral: The patient is nervous/anxious.   All other systems reviewed and are negative.   Blood pressure 122/82, pulse 66, temperature 97.9 F (36.6 C), temperature source Oral, resp. rate 18, height 5\' 6"  (1.676 m), weight 49.9 kg (110 lb), SpO2 99 %.Body mass index is 17.75 kg/m.  See SRA.                                                       Have you used any form of tobacco in the last 30 days? (Cigarettes, Smokeless Tobacco, Cigars, and/or Pipes): No  Has this patient used any form of tobacco in the last 30 days? (Cigarettes, Smokeless Tobacco, Cigars, and/or Pipes) Yes, No  Blood  Alcohol level:  Lab Results  Component Value Date   ETH <5 05/25/2016   ETH <5 04/23/2016    Metabolic Disorder Labs:  No results found for: HGBA1C, MPG No results found for: PROLACTIN No results found for: CHOL, TRIG, HDL, CHOLHDL, VLDL, LDLCALC  See Psychiatric Specialty Exam and Suicide Risk Assessment completed by Attending Physician prior to discharge.  Discharge destination:  Home  Is patient on multiple antipsychotic therapies at discharge:  No   Has Patient had three or more failed trials of antipsychotic monotherapy by history:  No  Recommended Plan for Multiple Antipsychotic Therapies: NA  Discharge Instructions    Diet - low sodium heart healthy    Complete by:  As directed    Increase activity slowly    Complete by:  As directed        Medication List    STOP taking these medications   traZODone 100 MG tablet Commonly known as:  DESYREL     TAKE these medications     Indication  calcium-vitamin D 500-200 MG-UNIT tablet Commonly known as:  OSCAL WITH D Take 1 tablet by mouth 2 (two) times daily.  Indication:  Low Amount of Calcium in the Blood   DULoxetine 60 MG capsule Commonly known as:  CYMBALTA Take 1 capsule (60 mg total) by mouth daily.  Indication:  Major Depressive Disorder   ferrous sulfate 325 (65 FE) MG tablet Take 1 tablet (325 mg total) by mouth 2 (two) times daily with a meal.  Indication:  Iron Deficiency   fluocinonide cream 0.05 % Commonly known as:  LIDEX Apply 1 application topically 2 (two) times daily as needed (for psorasis).  Indication:  Skin Disease Successfully Treated with Steroid Therapy   folic acid 1 MG tablet Commonly known as:  FOLVITE Take 1 mg by mouth daily.  Indication:  Anemia From Inadequate Folic Acid   HUMIRA PEN 40 KD/3.2IZ Pnkt Generic drug:  Adalimumab Inject 40 mg into the skin every 14 (fourteen) days.  Indication:  Rheumatoid Arthritis   hydrOXYzine 50 MG tablet Commonly known as:   ATARAX/VISTARIL Take 1 tablet (50 mg total) by mouth at bedtime as needed (insomnia). What changed:  medication strength  how much to take  when to take this  reasons to take this  Indication:  Anxiety Neurosis   lidocaine 5 % Commonly known as:  LIDODERM Place 2 patches onto the skin daily. Remove & Discard patch within 12 hours or as directed by MD  Indication:  arthritis  lisinopril 40 MG tablet Commonly known as:  PRINIVIL,ZESTRIL Take 40 mg by mouth daily.  Indication:  High Blood Pressure Disorder   methotrexate 2.5 MG tablet Commonly known as:  RHEUMATREX Take 3 tablets (7.5 mg total) by mouth 2 (two) times a week. Thursday and Friday  Indication:  Rheumatoid Arthritis   predniSONE 10 MG tablet Commonly known as:  DELTASONE Take 1 tablet (10 mg total) by mouth daily with breakfast.  Indication:  Rheumatoid Arthritis   temazepam 30 MG capsule Commonly known as:  RESTORIL Take 1 capsule (30 mg total) by mouth at bedtime.  Indication:  Trouble Sleeping   traMADol 50 MG tablet Commonly known as:  ULTRAM Take 1 tablet (50 mg total) by mouth every 6 (six) hours as needed for moderate pain or severe pain.  Indication:  Moderate to Moderately Severe Pain      Follow-up Information    [facility] .        [facility]   Specialty:  Behavioral Health Why:  Please arrive to the walk-in clinic Monday through Friday from 9-4pm for your assessment for your hospital follow up for medication management and therapy Contact information: [address] [place] [address] [phone]           Follow-up recommendations:  Activity:  as tolerated. Diet:  low sodium heart healthy. Other:  keep follow up appointments.   Comments:    Signed: Kristine Linea, MD 06/05/2016, 11:29 AM

## 2016-06-05 NOTE — Progress Notes (Signed)
D: Patient denies SI/HI/AVH. Patient affect and mood are depressed.  Patient did NOT attend evening group. Patient seclusive to room throughout the shift. No distress noted. A: Support and encouragement offered. Scheduled medications given to pt. Q 15 min checks continued for patient safety. R: Patient receptive. Patient remains safe on the unit.

## 2016-06-06 NOTE — Progress Notes (Signed)
Recreation Therapy Notes  INPATIENT RECREATION TR PLAN  Patient Details Name: Tara Brown MRN: [id number] DOB: 1961/09/01 Today's Date: 06/06/2016  Rec Therapy Plan Is patient appropriate for Therapeutic Recreation?: Yes Treatment times per week: At least once a week TR Treatment/Interventions: 1:1 session, Group participation (Comment) (Appropriate participation in daily recreational therapy tx)  Discharge Criteria Pt will be discharged from therapy if:: Treatment goals are met, Discharged Treatment plan/goals/alternatives discussed and agreed upon by:: Patient/family  Discharge Summary Short term goals set: See Care Plan Short term goals met: Complete Progress toward goals comments: One-to-one attended Which groups?: Coping skills, Other (Comment) (Self-expression) One-to-one attended: Self-esteem, stress management Reason goals not met: N/A Therapeutic equipment acquired: None Reason patient discharged from therapy: Discharge from hospital Pt/family agrees with progress & goals achieved: Yes Date patient discharged from therapy: 06/05/16   Leonette [facility], LRT/CTRS 06/06/2016, 12:06 PM

## 2016-06-12 ENCOUNTER — Other Ambulatory Visit: Payer: Self-pay | Admitting: Psychiatry

## 2016-07-05 ENCOUNTER — Other Ambulatory Visit: Payer: Self-pay | Admitting: Psychiatry

## 2016-09-19 ENCOUNTER — Other Ambulatory Visit: Payer: Self-pay | Admitting: Psychiatry

## 2016-09-30 DIAGNOSIS — R262 Difficulty in walking, not elsewhere classified: Secondary | ICD-10-CM | POA: Insufficient documentation

## 2016-11-09 DIAGNOSIS — S32511A Fracture of superior rim of right pubis, initial encounter for closed fracture: Secondary | ICD-10-CM | POA: Insufficient documentation

## 2017-02-17 ENCOUNTER — Inpatient Hospital Stay (HOSPITAL_COMMUNITY)
Admission: AD | Admit: 2017-02-17 | Discharge: 2017-02-21 | DRG: 885 | Disposition: A | Discharge status: Home / Self Care | Payer: 59 | Source: Intra-hospital | Attending: Psychiatry | Admitting: Psychiatry

## 2017-02-17 ENCOUNTER — Encounter (HOSPITAL_BASED_OUTPATIENT_CLINIC_OR_DEPARTMENT_OTHER): Payer: Self-pay | Admitting: Emergency Medicine

## 2017-02-17 ENCOUNTER — Emergency Department (HOSPITAL_BASED_OUTPATIENT_CLINIC_OR_DEPARTMENT_OTHER)
Admission: EM | Admit: 2017-02-17 | Discharge: 2017-02-17 | Disposition: A | Discharge status: Other Acute Inpatient Hospital | Payer: Medicare Other | Attending: Emergency Medicine | Admitting: Emergency Medicine

## 2017-02-17 ENCOUNTER — Emergency Department (HOSPITAL_BASED_OUTPATIENT_CLINIC_OR_DEPARTMENT_OTHER): Payer: Medicare Other

## 2017-02-17 DIAGNOSIS — G4700 Insomnia, unspecified: Secondary | ICD-10-CM | POA: Diagnosis not present

## 2017-02-17 DIAGNOSIS — Z7952 Long term (current) use of systemic steroids: Secondary | ICD-10-CM | POA: Diagnosis not present

## 2017-02-17 DIAGNOSIS — F331 Major depressive disorder, recurrent, moderate: Secondary | ICD-10-CM | POA: Insufficient documentation

## 2017-02-17 DIAGNOSIS — M25519 Pain in unspecified shoulder: Secondary | ICD-10-CM

## 2017-02-17 DIAGNOSIS — M069 Rheumatoid arthritis, unspecified: Secondary | ICD-10-CM | POA: Diagnosis present

## 2017-02-17 DIAGNOSIS — M545 Low back pain: Secondary | ICD-10-CM | POA: Insufficient documentation

## 2017-02-17 DIAGNOSIS — F419 Anxiety disorder, unspecified: Secondary | ICD-10-CM | POA: Diagnosis not present

## 2017-02-17 DIAGNOSIS — F329 Major depressive disorder, single episode, unspecified: Secondary | ICD-10-CM | POA: Diagnosis present

## 2017-02-17 DIAGNOSIS — Z79899 Other long term (current) drug therapy: Secondary | ICD-10-CM | POA: Insufficient documentation

## 2017-02-17 DIAGNOSIS — F332 Major depressive disorder, recurrent severe without psychotic features: Secondary | ICD-10-CM | POA: Diagnosis present

## 2017-02-17 DIAGNOSIS — M810 Age-related osteoporosis without current pathological fracture: Secondary | ICD-10-CM | POA: Diagnosis present

## 2017-02-17 DIAGNOSIS — D649 Anemia, unspecified: Secondary | ICD-10-CM | POA: Diagnosis not present

## 2017-02-17 DIAGNOSIS — R45851 Suicidal ideations: Secondary | ICD-10-CM

## 2017-02-17 DIAGNOSIS — I10 Essential (primary) hypertension: Secondary | ICD-10-CM | POA: Diagnosis present

## 2017-02-17 DIAGNOSIS — Z818 Family history of other mental and behavioral disorders: Secondary | ICD-10-CM | POA: Diagnosis not present

## 2017-02-17 DIAGNOSIS — M25511 Pain in right shoulder: Secondary | ICD-10-CM | POA: Insufficient documentation

## 2017-02-17 DIAGNOSIS — G8929 Other chronic pain: Secondary | ICD-10-CM | POA: Diagnosis present

## 2017-02-17 DIAGNOSIS — D638 Anemia in other chronic diseases classified elsewhere: Secondary | ICD-10-CM | POA: Diagnosis present

## 2017-02-17 DIAGNOSIS — F322 Major depressive disorder, single episode, severe without psychotic features: Secondary | ICD-10-CM

## 2017-02-17 DIAGNOSIS — M542 Cervicalgia: Secondary | ICD-10-CM | POA: Diagnosis present

## 2017-02-17 LAB — COMPREHENSIVE METABOLIC PANEL
ALT: 6 U/L — AB (ref 14–54)
AST: 14 U/L — AB (ref 15–41)
Albumin: 3.3 g/dL — ABNORMAL LOW (ref 3.5–5.0)
Alkaline Phosphatase: 88 U/L (ref 38–126)
Anion gap: 9 (ref 5–15)
BUN: 13 mg/dL (ref 6–20)
CHLORIDE: 104 mmol/L (ref 101–111)
CO2: 24 mmol/L (ref 22–32)
CREATININE: 0.82 mg/dL (ref 0.44–1.00)
Calcium: 9.1 mg/dL (ref 8.9–10.3)
GFR calc Af Amer: >60 mL/min (ref >60)
GFR calc non Af Amer: >60 mL/min (ref >60)
Glucose, Bld: 131 mg/dL — ABNORMAL HIGH (ref 65–99)
POTASSIUM: 3.5 mmol/L (ref 3.5–5.1)
SODIUM: 137 mmol/L (ref 135–145)
Total Bilirubin: 0.4 mg/dL (ref 0.3–1.2)
Total Protein: 7.8 g/dL (ref 6.5–8.1)

## 2017-02-17 LAB — ACETAMINOPHEN LEVEL: Acetaminophen (Tylenol), Serum: <10 ug/mL — ABNORMAL LOW (ref 10–30)

## 2017-02-17 LAB — CBC
HCT: 29.1 % — ABNORMAL LOW (ref 36.0–46.0)
HEMOGLOBIN: 8.9 g/dL — AB (ref 12.0–15.0)
MCH: 24.8 pg — ABNORMAL LOW (ref 26.0–34.0)
MCHC: 30.6 g/dL (ref 30.0–36.0)
MCV: 81.1 fL (ref 78.0–100.0)
Platelets: 349 10*3/uL (ref 150–400)
RBC: 3.59 MIL/uL — AB (ref 3.87–5.11)
RDW: 17.2 % — ABNORMAL HIGH (ref 11.5–15.5)
WBC: 7.1 10*3/uL (ref 4.0–10.5)

## 2017-02-17 LAB — ETHANOL: Alcohol, Ethyl (B): <5 mg/dL (ref <5)

## 2017-02-17 LAB — SALICYLATE LEVEL

## 2017-02-17 MED ORDER — ALUM & MAG HYDROXIDE-SIMETH 200-200-20 MG/5ML PO SUSP: 30.0000 mL | Freq: Four times a day (QID) | ORAL | Status: DC | PRN

## 2017-02-17 MED ORDER — CALCIUM CARBONATE-VITAMIN D 500-200 MG-UNIT PO TABS
1.0000 | ORAL_TABLET | Freq: Two times a day (BID) | ORAL | Status: DC
  Filled 2017-02-17: qty 1

## 2017-02-17 MED ORDER — FLUOCINONIDE 0.05 % EX CREA
1.0000 "application " | TOPICAL_CREAM | Freq: Two times a day (BID) | CUTANEOUS | Status: DC | PRN
  Filled 2017-02-17: qty 30

## 2017-02-17 MED ORDER — FERROUS SULFATE 325 (65 FE) MG PO TABS
325.0000 mg | ORAL_TABLET | Freq: Two times a day (BID) | ORAL | Status: DC
  Filled 2017-02-17: qty 1

## 2017-02-17 MED ORDER — HYDROCODONE-ACETAMINOPHEN 5-325 MG PO TABS
1.0000 | ORAL_TABLET | ORAL | Status: DC | PRN
  Administered 2017-02-17: 1 via ORAL
  Filled 2017-02-17: qty 1

## 2017-02-17 MED ORDER — ZOLPIDEM TARTRATE 10 MG PO TABS
10.0000 mg | ORAL_TABLET | Freq: Every evening | ORAL | Status: DC | PRN
  Filled 2017-02-17: qty 1

## 2017-02-17 MED ORDER — IBUPROFEN 400 MG PO TABS
600.0000 mg | ORAL_TABLET | Freq: Three times a day (TID) | ORAL | Status: DC | PRN
  Administered 2017-02-17: 600 mg via ORAL
  Filled 2017-02-17: qty 1

## 2017-02-17 MED ORDER — MAGNESIUM HYDROXIDE 400 MG/5ML PO SUSP: 30.0000 mL | Freq: Every day | ORAL | Status: DC | PRN

## 2017-02-17 MED ORDER — TRAZODONE HCL 50 MG PO TABS
50.0000 mg | ORAL_TABLET | Freq: Every evening | ORAL | Status: DC | PRN
  Administered 2017-02-18 (×2): 50 mg via ORAL
  Filled 2017-02-17 (×2): qty 1

## 2017-02-17 MED ORDER — ONDANSETRON HCL 8 MG PO TABS: 4.0000 mg | ORAL_TABLET | Freq: Three times a day (TID) | ORAL | Status: DC | PRN

## 2017-02-17 MED ORDER — ALUM & MAG HYDROXIDE-SIMETH 200-200-20 MG/5ML PO SUSP: 30.0000 mL | ORAL | Status: DC | PRN

## 2017-02-17 MED ORDER — ACETAMINOPHEN 325 MG PO TABS
650.0000 mg | ORAL_TABLET | Freq: Four times a day (QID) | ORAL | Status: DC | PRN
  Administered 2017-02-18: 650 mg via ORAL
  Filled 2017-02-17: qty 2

## 2017-02-17 MED ORDER — LISINOPRIL 10 MG PO TABS
40.0000 mg | ORAL_TABLET | Freq: Every day | ORAL | Status: DC
  Administered 2017-02-17: 40 mg via ORAL
  Filled 2017-02-17: qty 4

## 2017-02-17 MED ORDER — HYDROXYZINE HCL 25 MG PO TABS
25.0000 mg | ORAL_TABLET | Freq: Three times a day (TID) | ORAL | Status: DC | PRN
  Administered 2017-02-18: 25 mg via ORAL
  Filled 2017-02-17: qty 1

## 2017-02-17 NOTE — ED Notes (Signed)
Patient transported to X-ray 

## 2017-02-17 NOTE — ED Notes (Signed)
Meal given to patient 

## 2017-02-17 NOTE — ED Notes (Signed)
Urine collected and taken to lab was rejected due to low quantity.

## 2017-02-17 NOTE — ED Notes (Signed)
Pt remains in XRay 

## 2017-02-17 NOTE — ED Notes (Signed)
[facility] transport called - spoke with Sam. ETA 1030pm.

## 2017-02-17 NOTE — Progress Notes (Deleted)
Patient ID: Tara Brown, female   DOB: 1961/04/07, 56 y.o.   MRN: [id number] Per State regulations 482.30 this chart was reviewed for medical necessity with respect to the patient's admission/duration of stay.    Next review date: 02/21/17  Thurman Coyer, BSN, RN-BC  Case Manager

## 2017-02-17 NOTE — ED Triage Notes (Signed)
Pt deals with chronic pain and states she has had worsening shoulder and back pain over the past few weeks. Pt states she has been suffering from severe depression also for a few weeks with a plan of hanging herself. Denies substance abuse.

## 2017-02-17 NOTE — ED Provider Notes (Signed)
[facility] Provider Note   CSN: [id number] Arrival date & time: 02/17/17  1543   By signing my name below, I, Soijett Blue, attest that this documentation has been prepared under the direction and in the presence of Jaynie Crumble, [employer] Electronically Signed: Soijett Blue, ED Scribe. 02/17/17. 4:33 PM.  History   Chief Complaint Chief Complaint  Patient presents with  . Suicidal    HPI Tara Brown is a 56 y.o. female with a PMHx of MDD, RA, who presents to the Emergency Department complaining of feeling suicidal onset today. Pt reports associated neck pain x tingling sensation, right shoulder pain, and lower back pain. Pt is prescribed vicodin for chronic pain, but she stated she does not take anything.  Pt states that she has a hx of right shoulder and lower back issues and has been informed multiple times that she needs a shoulder replacement. Pt notes that she is hoping to get admitted to the hospital to get on the surgical schedule because of the increase in her shoulder and lower back pain. She notes that if she doesn't have the surgery that she feels as if "I will park my car on the side of the highway and walk in front of a truck." She notes that if her right shoulder and lower back pain was alleviated, she wouldn't feel suicidal. She states that she has a rheumatologist that she sees for her RA every 3 months. Pt notes that she does have a PCP, but she hasn't discussed her current symptoms with them. Pt reports that her last visit with an orthopedist was "awhile" ago and she doesn't remember the facility. She denies numbness, recent fall, and any other symptoms.    The history is provided by the patient. No language interpreter was used.    Past Medical History:  Diagnosis Date  . Anemia   . Arthritis   . 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 Osteoporosis     Patient Active Problem List   Diagnosis Date  Noted  . Osteoporosis 05/29/2016  . HTN (hypertension) 05/29/2016  . MDD (major depressive disorder), recurrent severe, without psychosis (HCC) 05/28/2016  . Rheumatoid arthritis (HCC) 12/06/2015  . Anemia of chronic disease 03/08/2015    Past Surgical History:  Procedure Laterality Date  . AMPUTATION Left 12/07/2015   Procedure: AMPUTATION LEFT GREAT TOE;  Surgeon: Nadara Mustard, MD;  Location: [facility];  Service: Orthopedics;  Laterality: Left;  . FOOT SURGERY    . HAND SURGERY    . HIP FRACTURE SURGERY    . KNEE SURGERY    . NECK SURGERY      OB History    No data available       Home Medications    Prior to Admission medications   Medication Sig Start Date End Date Taking? Authorizing Provider  calcium-vitamin D (OSCAL WITH D) 500-200 MG-UNIT tablet Take 1 tablet by mouth 2 (two) times daily.   Yes [provider]  ferrous sulfate 325 (65 FE) MG tablet Take 1 tablet (325 mg total) by mouth 2 (two) times daily with a meal. 05/15/16  Yes Oneta Rack, NP  folic acid (FOLVITE) 1 MG tablet Take 1 mg by mouth daily. 10/12/15  Yes [provider]  lisinopril (PRINIVIL,ZESTRIL) 40 MG tablet Take 40 mg by mouth daily. 11/26/14  Yes [provider]  oxycodone-acetaminophen (LYNOX) 10-300 MG tablet Take 1 tablet by mouth every 4 (four) hours as  needed for pain.   Yes [provider]  predniSONE (DELTASONE) 10 MG tablet Take 1 tablet (10 mg total) by mouth daily with breakfast. 06/02/16  Yes Pucilowska, Jolanta B, MD  zolpidem (AMBIEN) 5 MG tablet Take 10 mg by mouth at bedtime as needed for sleep.   Yes [provider]  fluocinonide cream (LIDEX) 0.05 % Apply 1 application topically 2 (two) times daily as needed (for psorasis).    [provider]    Family History Family History  Problem Relation Age of Onset  . Bipolar disorder Sister   . Suicidality Other   . Schizophrenia Other   . Stroke Neg Hx     Social History Social  History  Substance Use Topics  . Smoking status: Never Smoker  . Smokeless tobacco: Never Used  . Alcohol use No     Allergies   Patient has no known allergies.   Review of Systems Review of Systems  Musculoskeletal: Positive for arthralgias (right shoulder), back pain (lower back) and neck pain.  Neurological: Negative for numbness.       +Tingling sensation to neck  Psychiatric/Behavioral: Positive for sleep disturbance and suicidal ideas. The patient is nervous/anxious.   All other systems reviewed and are negative.    Physical Exam Updated Vital Signs BP 135/85 (BP Location: Left Arm)   Pulse (!) 109   Temp 97.8 F (36.6 C) (Oral)   Resp 20   Ht 5\' 6"  (1.676 m)   Wt 110 lb (49.9 kg)   SpO2 97%   BMI 17.75 kg/m   Physical Exam  Constitutional: She is oriented to person, place, and time. She appears well-developed and well-nourished. No distress.  HENT:  Head: Normocephalic and atraumatic.  Eyes: EOM are normal.  Neck: Neck supple.  Cardiovascular: Normal rate.   Pulmonary/Chest: Effort normal. No respiratory distress.  Abdominal: She exhibits no distension.  Musculoskeletal: Normal range of motion.  Patient had multiple joint deformities from her RA. Diffuse tenderness to palpation to the midline cervical and lumbar spine. Bilateral paravertebral tenderness. Right shoulder diffusely tender, pain with any range of motion of the shoulder joint. Normal elbow. Distal radial pulses are intact and equal bilaterally.  Neurological: She is alert and oriented to person, place, and time.  Skin: Skin is warm and dry.  Psychiatric: She has a normal mood and affect. Her behavior is normal.  Nursing note and vitals reviewed.    ED Treatments / Results  DIAGNOSTIC STUDIES: Oxygen Saturation is 97% on RA, nl by my interpretation.    COORDINATION OF CARE: 4:32 PM Discussed treatment plan with pt at bedside which includes medical clearance protocol and pt agreed to  plan.   Labs (all labs ordered are listed, but only abnormal results are displayed) Labs Reviewed  COMPREHENSIVE METABOLIC PANEL - Abnormal; Notable for the following:       Result Value   Glucose, Bld 131 (*)    Albumin 3.3 (*)    AST 14 (*)    ALT 6 (*)    All other components within normal limits  ACETAMINOPHEN LEVEL - Abnormal; Notable for the following:    Acetaminophen (Tylenol), Serum <10 (*)    All other components within normal limits  CBC - Abnormal; Notable for the following:    RBC 3.59 (*)    Hemoglobin 8.9 (*)    HCT 29.1 (*)    MCH 24.8 (*)    RDW 17.2 (*)    All other components within normal  limits  ETHANOL  SALICYLATE LEVEL  RAPID URINE DRUG SCREEN, HOSP PERFORMED    EKG  EKG Interpretation None       Radiology Dg Cervical Spine Complete  Result Date: 02/17/2017 CLINICAL DATA:  Shoulder pain. Rheumatoid arthritis. Neck pain and tingling sensation. EXAM: CERVICAL SPINE - COMPLETE 4+ VIEW COMPARISON:  CT of 12/23/2014 FINDINGS: Attempted lateral view is suboptimal secondary to patient positioning. Prevertebral soft tissues are within normal limits. Vertebral bodies are difficult to delineate, likely secondary to positioning and the extent of rheumatoid arthritis. There has likely been progression of ankylosis with suboptimal delineation of intervertebral levels. Oblique images demonstrate well aligned facets. Right-sided C6-7 neural foraminal narrowing likely tear secondary to uncovertebral joint hypertrophy. C3-4 and C4-5 neural foraminal narrowing on the left. Question proximal left humerus fracture of indeterminate acuity. Suboptimally evaluated. Attempted odontoid views are nondiagnostic. IMPRESSION: Severely degraded, nearly nondiagnostic evaluation of the cervical spine. Progression of apparent ankylosis, presumably related to the history of rheumatoid arthritis. This appearance is likely accentuated by suboptimal patient positioning. Electronically Signed    By: Jeronimo Greaves M.D.   On: 02/17/2017 18:04   Dg Lumbar Spine Complete  Result Date: 02/17/2017 CLINICAL DATA:  Low back pain. EXAM: LUMBAR SPINE - COMPLETE 4+ VIEW COMPARISON:  09/30/2016 radiographs FINDINGS: No acute fracture or subluxation identified. Mild T12 compression is unchanged. Surgical hardware and fractured screws in the right hemipelvis are again noted. No focal bony lesions or spondylolysis noted. Disc spaces are maintained. IMPRESSION: No acute bony abnormality. Electronically Signed   By: Harmon Pier M.D.   On: 02/17/2017 18:06   Dg Shoulder Right  Result Date: 02/17/2017 CLINICAL DATA:  56 year old female with history of rheumatoid arthritis. Right shoulder pain. EXAM: RIGHT SHOULDER - 2+ VIEW COMPARISON:  Right shoulder radiograph 05/07/2016. FINDINGS: No acute displaced fracture. Widening of the acromioclavicular joint, potentially related to [facility] ligament disruption. Multiple areas of sclerosis in the right humeral head and neck which may reflect areas of bone infarction, or could indicate the presence of enchondromas. These appear similar to prior examinations. There is also marked narrowing of the glenohumeral joint (better demonstrated on prior study 05/07/2016), with remodeling of the articular surfaces, related to underlying inflammatory arthropathy. Multiple old healed right-sided rib fractures are incidentally noted. IMPRESSION: 1. Compared to the prior study there is been interval widening of the acromioclavicular joint which suggest some disruption of the [facility] ligament, presumably related to underlying inflammatory arthropathy (unless there is been recent history of trauma). 2. Other chronic changes, similar to the prior study, as above. Electronically Signed   By: Trudie Reed M.D.   On: 02/17/2017 18:05    Procedures Procedures (including critical care time)  Medications Ordered in ED Medications  ibuprofen (ADVIL,MOTRIN) tablet 600 mg (600 mg Oral Given 02/17/17 1843)   ondansetron (ZOFRAN) tablet 4 mg (not administered)  alum & mag hydroxide-simeth (MAALOX/MYLANTA) 200-200-20 MG/5ML suspension 30 mL (not administered)  calcium-vitamin D (OSCAL WITH D) 500-200 MG-UNIT per tablet 1 tablet (not administered)  ferrous sulfate tablet 325 mg (not administered)  fluocinonide cream (LIDEX) 0.05 % 1 application (not administered)  lisinopril (PRINIVIL,ZESTRIL) tablet 40 mg (40 mg Oral Given 02/17/17 1843)  zolpidem (AMBIEN) tablet 10 mg (not administered)  HYDROcodone-acetaminophen (NORCO/VICODIN) 5-325 MG per tablet 1 tablet (1 tablet Oral Given 02/17/17 1843)     Initial Impression / Assessment and Plan / ED Course  I have reviewed the triage vital signs and the nursing notes.  Pertinent labs & imaging  results that were available during my care of the patient were reviewed by me and considered in my medical decision making (see chart for details).     Patient with history of severe RA, osteoporosis, multiple bony fractures and joint deformities. She is here with worsening depression, stating that if nothing gets done for her today as far as her joint pain and requesting a right shoulder replacement, she is going to go home and "kill myself." Patient does have history of psychiatric disorder with prior admission for suicidal thoughts and ideations. We will check labs, x-rays, although patient denies any acute injuries. We will get TTS  is involved.  Pt's cervical spine films unable to read, however, I do not think she needs further imaging such CT since she has not had any new injuries or falls. Patient is anemic, hemoglobin 8.9, close to baseline. She is medically cleared.   Assessed By TTS, qualifies for inpatient, accepted to behavioral health.  Vitals:   02/17/17 1550 02/17/17 2159 02/17/17 2233  BP: 135/85 (!) 99/56 (!) 93/58  Pulse: (!) 109 79 78  Resp: 20 20 20   Temp: 97.8 F (36.6 C) 98.3 F (36.8 C) 98.2 F (36.8 C)  TempSrc: Oral Oral Oral  SpO2:  97% 98% 96%  Weight: 49.9 kg (110 lb)    Height: 5\' 6"  (1.676 m)        Final Clinical Impressions(s) / ED Diagnoses   Final diagnoses:  Shoulder pain  Suicidal ideation  Other chronic pain    New Prescriptions Discharge Medication List as of 02/17/2017 11:01 PM     I personally performed the services described in this documentation, which was scribed in my presence. The recorded information has been reviewed and is accurate.     Jaynie Crumble, [name] 02/18/17 0003    Alvira Monday, MD 02/19/17 1600

## 2017-02-17 NOTE — ED Notes (Signed)
Pt tearful during assessment . States that she is depressed about her appearance.

## 2017-02-17 NOTE — ED Notes (Signed)
Pt requesting more pain medicine. Rates pain 7/10. EDPA notified. No changes to orders at this time. Pt made aware.

## 2017-02-17 NOTE — ED Notes (Signed)
Pt off video call with TTS. Reports to RN that she is glad she is getting help for her depression.

## 2017-02-17 NOTE — ED Notes (Signed)
TTS on video call with pt.

## 2017-02-17 NOTE — BH Assessment (Addendum)
Tele Assessment Note   Tara Brown is an 56 y.o. divorced female who presents unaccompanied to [facility] reporting chronic shoulder and back pain and severe depressive symptoms. Pt has chronic pain since age 71 related to rheumatoid arthritis and also has a history of major depressive disorder. Pt reports she has been increasingly depressed and angry for the past month. Pt reports symptoms including crying spells, social withdrawal, loss of interest in usual pleasures, fatigue, irritability, decreased concentration, decreased sleep, decreased appetite and feelings of worthlessness and hopelessness. Pt reports she has lost approximately 50 pounds in the past year due to decreased appetite. She says she has increased anxiety and worry, specifically that in the near future she may need to move into an assisted living facility. She reports current suicidal ideation with plans to either hang herself or park her car on the side of a highway and walk in front of a truck. Pt denies any history of suicide attempts. Pt says she had a nephew to complete suicide. Pt denies current homicidal ideation or history of aggression. She denies any history of psychotic symptoms. She denies any history of alcohol or substance abuse.  Pt identifies her chronic pain as her primary stressor. Pt states that she has a hx of right shoulder and lower back issues and has been informed multiple times that she needs a shoulder replacement. Pt notes that she is hoping to get admitted to the hospital to get on the surgical schedule because of the increase in her shoulder and lower back pain. She notes that if she doesn't have the surgery that she feels she will kill herself. Pt says she has limited support and identifies a neighbor as supportive. Pt has no children and lives alone. She says she can perform most of her ADLs independently but cannot move her arms above her head and need assistance with dressing, washing and combing  her hair. Pt says she avoids stairs. Pt denies any current legal problems.  Pt says she does not currently have an outpatient psychiatrist or therapist. She says her RA physician, Dr. Macario Brown, prescribes Zoloft 100 mg. Pt reports she is compliant with all prescription medications. Pt reports her last psychiatric admission was at [facility] in September 2017 and Pt was inpatient at [facility] in August 2018.  Pt is dressed in hospital scrubs, alert, oriented x4 with normal speech and normal motor behavior. Eye contact is good and Pt is tearful at times. Pt's mood is depressed and affect is congruent with mood. Thought process is coherent and relevant. There is no indication Pt is currently responding to internal stimuli or experiencing delusional thought content. Pt was pleasant and cooperative throughout assessment. She says she will sign voluntarily into a psychiatric facility and states she would prefer to be admitted to [facility].   Diagnosis: Major Depressive Disorder, Recurrent, Severe Without Psychotic Features  Past Medical History:  Past Medical History:  Diagnosis Date  . Anemia   . Arthritis   . 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 Osteoporosis     Past Surgical History:  Procedure Laterality Date  . AMPUTATION Left 12/07/2015   Procedure: AMPUTATION LEFT GREAT TOE;  Surgeon: Nadara Mustard, MD;  Location: [facility];  Service: Orthopedics;  Laterality: Left;  . FOOT SURGERY    . HAND SURGERY    . HIP FRACTURE SURGERY    . KNEE SURGERY    . NECK SURGERY  Family History:  Family History  Problem Relation Age of Onset  . Bipolar disorder Sister   . Suicidality Other   . Schizophrenia Other   . Stroke Neg Hx     Social History:  reports that she has never smoked. She has never used smokeless tobacco. She reports that she does not drink alcohol or use drugs.  Additional Social History:  Alcohol / Drug Use Pain  Medications: Pt denies.  Prescriptions: Pt denies. Over the Counter: Pt denies.  History of alcohol / drug use?: No history of alcohol / drug abuse Longest period of sobriety (when/how long): NA  CIWA: CIWA-Ar BP: 135/85 Pulse Rate: (!) 109 COWS:    PATIENT STRENGTHS: (choose at least two) Ability for insight Average or above average intelligence Capable of independent living Metallurgist fund of knowledge Motivation for treatment/growth  Allergies: No Known Allergies  Home Medications:  (Not in a hospital admission)  OB/GYN Status:  No LMP recorded. Patient is postmenopausal.  General Assessment Data Location of Assessment: [facility] Assessment Services ([facility]) TTS Assessment: In system Is this a Tele or Face-to-Face Assessment?: Tele Assessment Is this an Initial Assessment or a Re-assessment for this encounter?: Initial Assessment Marital status: Divorced [place] name: NA Is patient pregnant?: No Pregnancy Status: No Living Arrangements: Alone Can pt return to current living arrangement?: Yes Admission Status: Voluntary Is patient capable of signing voluntary admission?: Yes Referral Source: Self/Family/Friend Insurance type: [facility] Medicare     Crisis Care Plan Living Arrangements: Alone Legal Guardian: Other: (Self) Name of Psychiatrist: None Name of Therapist: None  Education Status Is patient currently in school?: No Current Grade: NA Highest grade of school patient has completed: Bachelor's degrees Name of school: NA Contact person: NA  Risk to self with the past 6 months Suicidal Ideation: Yes-Currently Present Has patient been a risk to self within the past 6 months prior to admission? : Yes Suicidal Intent: Yes-Currently Present Has patient had any suicidal intent within the past 6 months prior to admission? : Yes Is patient at risk for suicide?: Yes Suicidal Plan?: Yes-Currently Present Has patient had  any suicidal plan within the past 6 months prior to admission? : Yes Specify Current Suicidal Plan: Plan to hang herself or step in front of a truck Access to Means: Yes Specify Access to Suicidal Means: Access to rope and traffic What has been your use of drugs/alcohol within the last 12 months?: Pt denies Previous Attempts/Gestures: No How many times?: 0 Other Self Harm Risks: None Identified Triggers for Past Attempts: None known Intentional Self Injurious Behavior: None Family Suicide History: Yes (Nephew committed suicide) Recent stressful life event(s): Other (Comment) (Chronic pain) Persecutory voices/beliefs?: No Depression: Yes Depression Symptoms: Despondent, Insomnia, Tearfulness, Fatigue, Isolating, Guilt, Loss of interest in usual pleasures, Feeling worthless/self pity, Feeling angry/irritable Substance abuse history and/or treatment for substance abuse?: No Suicide prevention information given to non-admitted patients: Not applicable  Risk to Others within the past 6 months Homicidal Ideation: No Does patient have any lifetime risk of violence toward others beyond the six months prior to admission? : No Thoughts of Harm to Others: No Current Homicidal Intent: No Current Homicidal Plan: No Access to Homicidal Means: No Identified Victim: None History of harm to others?: No Assessment of Violence: None Noted Violent Behavior Description: Pt denies history of violence Does patient have access to weapons?: No Criminal Charges Pending?: No Does patient have a court date: No Is patient on probation?: No  Psychosis Hallucinations: None noted Delusions: None noted  Mental Status Report Appearance/Hygiene: In scrubs Eye Contact: Good Motor Activity: Unremarkable Speech: Logical/coherent Level of Consciousness: Alert Mood: Depressed Affect: Depressed Anxiety Level: Moderate Thought Processes: Coherent, Relevant Judgement: Unimpaired Orientation: Person, Place,  Time, Situation, Appropriate for developmental age Obsessive Compulsive Thoughts/Behaviors: None  Cognitive Functioning Concentration: Normal Memory: Recent Intact, Remote Intact IQ: Average Insight: Good Impulse Control: Fair Appetite: Poor Weight Loss: 50 Weight Gain: 0 Sleep: Decreased Total Hours of Sleep: 6 Vegetative Symptoms: None  ADLScreening [facility] Assessment Services) Patient's cognitive ability adequate to safely complete daily activities?: Yes Patient able to express need for assistance with ADLs?: Yes Independently performs ADLs?: No  Prior Inpatient Therapy Prior Inpatient Therapy: Yes Prior Therapy Dates: 05/2016, 04/2016, multiple admits Prior Therapy Facilty/Provider(s): [facility], [facility] Reason for Treatment: MDD  Prior Outpatient Therapy Prior Outpatient Therapy: No Prior Therapy Dates: NA Prior Therapy Facilty/Provider(s): NA Reason for Treatment: NA Does patient have an ACCT team?: No Does patient have Intensive In-House Services?  : No Does patient have [facility] services? : No Does patient have P4CC services?: No  ADL Screening (condition at time of admission) Patient's cognitive ability adequate to safely complete daily activities?: Yes Is the patient deaf or have difficulty hearing?: No Does the patient have difficulty seeing, even when wearing glasses/contacts?: No Does the patient have difficulty concentrating, remembering, or making decisions?: No Patient able to express need for assistance with ADLs?: Yes Does the patient have difficulty dressing or bathing?: Yes Independently performs ADLs?: No Communication: Independent Dressing (OT): Needs assistance Is this a change from baseline?: Pre-admission baseline Grooming: Needs assistance Is this a change from baseline?: Pre-admission baseline Feeding: Independent Bathing: Needs assistance Is this a change from baseline?: Pre-admission baseline Toileting: Independent In/Out Bed:  Independent Walks in Home: Independent Does the patient have difficulty walking or climbing stairs?: Yes Weakness of Legs: None Weakness of Arms/Hands: Both  Home Assistive Devices/Equipment Home Assistive Devices/Equipment: Cane (specify quad or straight)    Abuse/Neglect Assessment (Assessment to be complete while patient is alone) Physical Abuse: Denies Sexual Abuse: Denies Exploitation of patient/patient's resources: Denies Self-Neglect: Denies     Merchant navy officer (For Healthcare) Does Patient Have a Medical Advance Directive?: No Would patient like information on creating a medical advance directive?: No - Patient declined    Additional Information 1:1 In Past 12 Months?: No CIRT Risk: No Elopement Risk: No Does patient have medical clearance?: Yes     Disposition: Gretta Arab, AC at [facility], confirmed bed will be available at 2200 today. Gave clinical report to Hillery Jacks, NP who said Pt meets criteria for inpatient psychiatric treatment and accepted Pt to the service of Dr. Carmon Ginsberg. [place], room 404-2. Notified Jaynie Crumble, [name] and Charna Elizabeth, RN at [facility] of acceptance.  Disposition Initial Assessment Completed for this Encounter: Yes Disposition of Patient: Inpatient treatment program Type of inpatient treatment program: Adult   Pamalee Leyden, [facility], [facility], [facility] Triage Specialist [phone]   Pamalee Leyden 02/17/2017 7:54 PM

## 2017-02-18 ENCOUNTER — Encounter (HOSPITAL_COMMUNITY): Payer: Self-pay | Admitting: *Deleted

## 2017-02-18 DIAGNOSIS — R45851 Suicidal ideations: Secondary | ICD-10-CM

## 2017-02-18 DIAGNOSIS — Z818 Family history of other mental and behavioral disorders: Secondary | ICD-10-CM

## 2017-02-18 DIAGNOSIS — F329 Major depressive disorder, single episode, unspecified: Secondary | ICD-10-CM

## 2017-02-18 MED ORDER — TRAMADOL HCL 50 MG PO TABS
50.0000 mg | ORAL_TABLET | Freq: Two times a day (BID) | ORAL | Status: DC | PRN
  Administered 2017-02-18 – 2017-02-19 (×2): 50 mg via ORAL
  Filled 2017-02-18 (×2): qty 1

## 2017-02-18 MED ORDER — VENLAFAXINE HCL ER 75 MG PO CP24
75.0000 mg | ORAL_CAPSULE | Freq: Every day | ORAL | Status: DC
  Administered 2017-02-19: 75 mg via ORAL
  Filled 2017-02-18 (×3): qty 1

## 2017-02-18 MED ORDER — VENLAFAXINE HCL ER 37.5 MG PO CP24
37.5000 mg | ORAL_CAPSULE | Freq: Once | ORAL | Status: AC
  Administered 2017-02-18: 37.5 mg via ORAL
  Filled 2017-02-18 (×2): qty 1

## 2017-02-18 NOTE — [facility] Group Notes (Signed)
02/18/2017   Type of Therapy:  Group Therapy  Participation Level:  Active  Participation Quality:  Appropriate and Attentive  Affect:  Appropriate  Cognitive:  Alert and Oriented  Insight:  Improving  Engagement in Therapy:  Improving  Modes of Intervention:  Discussion  Today's group was about positive affirmation toward self and others. Patients went around the room and said 2 positive things about themselves. Patients reflected on how it felt to share something positive with others. Patient encouraged to develop ways to develop celebrations for positive events in life so that they are marked a memory. Patients encouraged to have a daily reflection of positive characteristics or circumstances.   Anjelica Gorniak J Demon Volante MSW, LCSW 

## 2017-02-18 NOTE — Tx Team (Signed)
Initial Treatment Plan 02/18/2017 2:04 AM Tara Brown TIR:[id number]    PATIENT STRESSORS: Financial difficulties Health problems Other: Pain   PATIENT STRENGTHS: Ability for insight Average or above average intelligence Capable of independent living Communication skills Motivation for treatment/growth Supportive family/friends   PATIENT IDENTIFIED PROBLEMS: At risk for suicide  "Dealing with pain"  "Better outlook"                 DISCHARGE CRITERIA:  Ability to meet basic life and health needs Improved stabilization in mood, thinking, and/or behavior Motivation to continue treatment in a less acute level of care Need for constant or close observation no longer present  PRELIMINARY DISCHARGE PLAN: Outpatient therapy Return to previous living arrangement  PATIENT/FAMILY INVOLVEMENT: This treatment plan has been presented to and reviewed with the patient, [name].  The patient and family have been given the opportunity to ask questions and make suggestions.  Carleene Overlie, RN 02/18/2017, 2:04 AM

## 2017-02-18 NOTE — H&P (Signed)
Psychiatric Admission Assessment Adult  Patient Identification: Tara Brown MRN:  [id number] Date of Evaluation:  02/18/2017 Chief Complaint:  mdd Principal Diagnosis: <principal problem not specified> Diagnosis:   Patient Active Problem List   Diagnosis Date Noted  . MDD (major depressive disorder) [F32.9] 02/17/2017  . Osteoporosis [M81.0] 05/29/2016  . HTN (hypertension) [I10] 05/29/2016  . MDD (major depressive disorder), recurrent severe, without psychosis ([place]) [F33.2] 05/28/2016  . Rheumatoid arthritis ([place]) [M06.9] 12/06/2015  . Anemia of chronic disease [D63.8] 03/08/2015   History of Present Illness:  56 yo Caucasian female, divorced, lives alone. Referred from [facility]. Self presented there unaccompanied. Had expressed worsening depression. Had expressed feelings of worthlessness and hopelessness. Had expressed thoughts of driving her car to the highway and walk in front of a truck. Her main stressor is chronic pain in her right shoulder. She had been on opoid medication without any benefit. Later expressed hope of getting surgery for her shoulder during this hospitalization. Routine labs was significant for anaemia of chronic illness. No substance use. Patient reports long history of pain. She has had multiple joint replaced over the years. Says some of them needs review. Patient says she has care staff come in to help he with ADL's. Says her pain has been getting worse lately. She has been down in the dumps. Says she is hopeful and wants to get better. Says her family has suggested ALF placement but she does not want to give up her freedom yet. Says she enjoys to take care of her cat. Her neighbor is looking after her cat now.   Associated Signs/Symptoms: Depression Symptoms:  depressed mood, hopelessness, recurrent thoughts of death, weight loss, (Hypo) Manic Symptoms:  None Anxiety Symptoms:  None Psychotic Symptoms:  None PTSD Symptoms: NA Total Time spent with  patient: 1 hour  Past Psychiatric History: MDD over the years. Has been admitted twice in the past. Failed trials of Lexapro and Duloxetine. Current on Sertraline which she feels is not really helping. Single episode of OD. No past history of violent behavior. No past history of mania.No access to weapons.   Is the patient at risk to self? Yes.    Has the patient been a risk to self in the past 6 months? Yes.    Has the patient been a risk to self within the distant past? Yes.    Is the patient a risk to others? No.  Has the patient been a risk to others in the past 6 months? No.  Has the patient been a risk to others within the distant past? No.   Prior Inpatient Therapy:   Prior Outpatient Therapy:    Alcohol Screening: 1. How often do you have a drink containing alcohol?: Never 9. Have you or someone else been injured as a result of your drinking?: No 10. Has a relative or friend or a doctor or another health worker been concerned about your drinking or suggested you cut down?: No Alcohol Use Disorder Identification Test Final Score (AUDIT): 0 Brief Intervention: AUDIT score less than 7 or less-screening does not suggest unhealthy drinking-brief intervention not indicated Substance Abuse History in the last 12 months:  No. Consequences of Substance Abuse: NA Previous Psychotropic Medications: Yes  Psychological Evaluations: Yes  Past Medical History:  Past Medical History:  Diagnosis Date  . Anemia   . Arthritis   . Major depressive disorder, recurrent, severe without psychotic features ([place]) 03/11/2015  . Osteoporosis     Past Surgical History:  Procedure Laterality Date  . AMPUTATION Left 12/07/2015   Procedure: AMPUTATION LEFT GREAT TOE;  Surgeon: Newt Minion, MD;  Location: [facility];  Service: Orthopedics;  Laterality: Left;  . FOOT SURGERY    . HAND SURGERY    . HIP FRACTURE SURGERY    . KNEE SURGERY    . NECK SURGERY     Family History:  Family History  Problem  Relation Age of Onset  . Bipolar disorder Sister   . Suicidality Other   . Schizophrenia Other   . Stroke Neg Hx    Family Psychiatric  History: History of depression in her mom. Second generation relative committed suicide.  Tobacco Screening: Have you used any form of tobacco in the last 30 days? (Cigarettes, Smokeless Tobacco, Cigars, and/or Pipes): No Social History:  History  Alcohol Use No     History  Drug Use No    Additional Social History:          Allergies:  No Known Allergies Lab Results:  Results for orders placed or performed during the hospital encounter of 02/17/17 (from the past 48 hour(s))  Comprehensive metabolic panel     Status: Abnormal   Collection Time: 02/17/17  4:50 PM  Result Value Ref Range   Sodium 137 135 - 145 mmol/L   Potassium 3.5 3.5 - 5.1 mmol/L   Chloride 104 101 - 111 mmol/L   CO2 24 22 - 32 mmol/L   Glucose, Bld 131 (H) 65 - 99 mg/dL   BUN 13 6 - 20 mg/dL   Creatinine, Ser 0.82 0.44 - 1.00 mg/dL   Calcium 9.1 8.9 - 10.3 mg/dL   Total Protein 7.8 6.5 - 8.1 g/dL   Albumin 3.3 (L) 3.5 - 5.0 g/dL   AST 14 (L) 15 - 41 U/L   ALT 6 (L) 14 - 54 U/L   Alkaline Phosphatase 88 38 - 126 U/L   Total Bilirubin 0.4 0.3 - 1.2 mg/dL   GFR calc non Af Amer >60 >60 mL/min   GFR calc Af Amer >60 >60 mL/min    Comment: (NOTE) The eGFR has been calculated using the CKD EPI equation. This calculation has not been validated in all clinical situations. eGFR's persistently <60 mL/min signify possible Chronic Kidney Disease.    Anion gap 9 5 - 15  Ethanol     Status: None   Collection Time: 02/17/17  4:50 PM  Result Value Ref Range   Alcohol, Ethyl (B) <5 <5 mg/dL    Comment:        LOWEST DETECTABLE LIMIT FOR SERUM ALCOHOL IS 5 mg/dL FOR MEDICAL PURPOSES ONLY   Salicylate level     Status: None   Collection Time: 02/17/17  4:50 PM  Result Value Ref Range   Salicylate Lvl <1.4 2.8 - 30.0 mg/dL  Acetaminophen level     Status: Abnormal    Collection Time: 02/17/17  4:50 PM  Result Value Ref Range   Acetaminophen (Tylenol), Serum <10 (L) 10 - 30 ug/mL    Comment:        THERAPEUTIC CONCENTRATIONS VARY SIGNIFICANTLY. A RANGE OF 10-30 ug/mL MAY BE AN EFFECTIVE CONCENTRATION FOR MANY PATIENTS. HOWEVER, SOME ARE BEST TREATED AT CONCENTRATIONS OUTSIDE THIS RANGE. ACETAMINOPHEN CONCENTRATIONS >150 ug/mL AT 4 HOURS AFTER INGESTION AND >50 ug/mL AT 12 HOURS AFTER INGESTION ARE OFTEN ASSOCIATED WITH TOXIC REACTIONS.   cbc     Status: Abnormal   Collection Time: 02/17/17  4:50 PM  Result Value  Ref Range   WBC 7.1 4.0 - 10.5 K/uL   RBC 3.59 (L) 3.87 - 5.11 MIL/uL   Hemoglobin 8.9 (L) 12.0 - 15.0 g/dL   HCT 29.1 (L) 36.0 - 46.0 %   MCV 81.1 78.0 - 100.0 fL   MCH 24.8 (L) 26.0 - 34.0 pg   MCHC 30.6 30.0 - 36.0 g/dL   RDW 17.2 (H) 11.5 - 15.5 %   Platelets 349 150 - 400 K/uL    Blood Alcohol level:  Lab Results  Component Value Date   ETH <5 02/17/2017   ETH <5 86/76/7209    Metabolic Disorder Labs:  No results found for: HGBA1C, MPG No results found for: PROLACTIN No results found for: CHOL, TRIG, HDL, CHOLHDL, VLDL, LDLCALC  Current Medications: Current Facility-Administered Medications  Medication Dose Route Frequency Provider Last Rate Last Dose  . acetaminophen (TYLENOL) tablet 650 mg  650 mg Oral Q6H PRN Derrill Center, NP   650 mg at 02/18/17 0036  . alum & mag hydroxide-simeth (MAALOX/MYLANTA) 200-200-20 MG/5ML suspension 30 mL  30 mL Oral Q4H PRN Derrill Center, NP      . hydrOXYzine (ATARAX/VISTARIL) tablet 25 mg  25 mg Oral TID PRN Derrill Center, NP   25 mg at 02/18/17 0036  . magnesium hydroxide (MILK OF MAGNESIA) suspension 30 mL  30 mL Oral Daily PRN Derrill Center, NP      . traZODone (DESYREL) tablet 50 mg  50 mg Oral QHS PRN Derrill Center, NP   50 mg at 02/18/17 0036   PTA Medications: Prescriptions Prior to Admission  Medication Sig Dispense Refill Last Dose  . calcium-vitamin D (OSCAL  WITH D) 500-200 MG-UNIT tablet Take 1 tablet by mouth daily with breakfast.    Past Month at Unknown time  . ferrous sulfate 325 (65 FE) MG tablet Take 1 tablet (325 mg total) by mouth 2 (two) times daily with a meal. (Patient taking differently: Take 325 mg by mouth daily with breakfast. ) 30 tablet 0 Past Month at Unknown time  . fluocinonide cream (LIDEX) 4.70 % Apply 1 application topically 2 (two) times daily as needed (for psorasis).   Past Month at Unknown time  . folic acid (FOLVITE) 1 MG tablet Take 1 mg by mouth daily.  0 Past Month at Unknown time  . lisinopril (PRINIVIL,ZESTRIL) 40 MG tablet Take 40 mg by mouth daily.   Past Month at Unknown time  . oxyCODONE-acetaminophen (PERCOCET) 7.5-325 MG tablet Take 1 tablet by mouth every 4 (four) hours as needed for severe pain.   Past Week at Unknown time  . predniSONE (DELTASONE) 10 MG tablet Take 1 tablet (10 mg total) by mouth daily with breakfast. 30 tablet 0 Past Month at Unknown time  . sertraline (ZOLOFT) 100 MG tablet Take 100 mg by mouth daily.   Past Month at Unknown time  . zolpidem (AMBIEN) 5 MG tablet Take 10 mg by mouth at bedtime as needed for sleep.   Past Month at Unknown time  . Tofacitinib Citrate (XELJANZ XR) 11 MG TB24 Take 11 mg by mouth daily.   Not Taking at Unknown time    Musculoskeletal: Strength & Muscle Tone: spastic and atrophy Gait & Station: unsteady Patient leans: N/A  Psychiatric Specialty Exam: Physical Exam  Constitutional: She is oriented to person, place, and time.  HENT:  Head: Normocephalic and atraumatic.  Eyes: Conjunctivae are normal. Pupils are equal, round, and reactive to light.  Cardiovascular: Normal rate.  Respiratory: Effort normal.  Neurological: She is alert and oriented to person, place, and time.  Skin: Skin is warm and dry.  Psychiatric:  As above    ROS  Blood pressure 101/62, pulse 80, temperature 98.4 F (36.9 C), resp. rate 16, height 5' 4"  (1.626 m), weight 46.7 kg (103  lb).Body mass index is 17.68 kg/m.  General Appearance: Frail looking, pleasant and engages well.   Eye Contact:  Good  Speech:  Clear and Coherent and Normal Rate  Volume:  Normal  Mood:  Depressed  Affect:  Appropriate and Restricted  Thought Process:  Linear  Orientation:  Full (Time, Place, and Person)  Thought Content:  Worried about her health issues  Suicidal Thoughts:  Yes.  with intent/plan  Homicidal Thoughts:  No  Memory:  Immediate;   Good Recent;   Good Remote;   Good  Judgement:  Poor  Insight:  Good  Psychomotor Activity:  Decreased  Concentration:  WNL  Recall:  Good  Fund of Knowledge:  Good  Language:  Good  Akathisia:  Negative  Handed:    AIMS (if indicated):     Assets:  Communication Skills Desire for Improvement Financial Resources/Insurance Housing  ADL's:  Impaired  Cognition:  WNL  Sleep:  Number of Hours: 4    Treatment Plan Summary: Patient presents with depression in context of chronic pain. We explored use of an SNRI. She consented to trial of Venlafaxine after we reviewed the risks and benefits. He plan to discontinue Sertraline. We also agreed to use Tramadol PRN as she has used it in the past.   Observation Level/Precautions:  15 minute checks  Laboratory:    Psychotherapy:    Medications:    Consultations:    Discharge Concerns:    Estimated LOS:  Other:     Physician Treatment Plan for Primary Diagnosis: <principal problem not specified> Long Term Goal(s): Improvement in symptoms so as ready for discharge  Short Term Goals: Ability to identify changes in lifestyle to reduce recurrence of condition will improve, Ability to verbalize feelings will improve, Ability to disclose and discuss suicidal ideas, Ability to demonstrate self-control will improve, Ability to identify and develop effective coping behaviors will improve, Ability to maintain clinical measurements within normal limits will improve, Compliance with prescribed  medications will improve and Ability to identify triggers associated with substance abuse/mental health issues will improve  Physician Treatment Plan for Secondary Diagnosis: Active Problems:   MDD (major depressive disorder)  Long Term Goal(s): Improvement in symptoms so as ready for discharge  Short Term Goals: Ability to identify changes in lifestyle to reduce recurrence of condition will improve, Ability to verbalize feelings will improve, Ability to disclose and discuss suicidal ideas, Ability to demonstrate self-control will improve, Ability to identify and develop effective coping behaviors will improve, Ability to maintain clinical measurements within normal limits will improve, Compliance with prescribed medications will improve and Ability to identify triggers associated with substance abuse/mental health issues will improve  I certify that inpatient services furnished can reasonably be expected to improve the patient's condition.    [place], MD 6/10/20182:32 PM

## 2017-02-18 NOTE — Progress Notes (Signed)
Tara Brown is seen sitting ins the dayroom. She is quite pale. SHe avoids eye contact. She does not appear to weigh more than 80=90 lbs/. A She tells Clinical research associate that she doesn' tknow what happeneded....that she thinks her medications quit working . A She is awaiting MD to assess her and start medication. R Safety in place.

## 2017-02-18 NOTE — [facility] Suicide Risk Assessment (Signed)
[facility] Admission Suicide Risk Assessment   Nursing information obtained from:  Patient Demographic factors:  Divorced or widowed, Caucasian, Living alone, Unemployed Current Mental Status:  Suicidal ideation indicated by patient, Suicide plan, Self-harm thoughts Loss Factors:  Financial problems / change in socioeconomic status Historical Factors:  Family history of suicide Risk Reduction Factors:  Positive social support  Total Time spent with patient: 45 minutes Principal Problem: <principal problem not specified> Diagnosis:   Patient Active Problem List   Diagnosis Date Noted  . MDD (major depressive disorder) [F32.9] 02/17/2017  . Osteoporosis [M81.0] 05/29/2016  . HTN (hypertension) [I10] 05/29/2016  . MDD (major depressive disorder), recurrent severe, without psychosis ([facility]) [F33.2] 05/28/2016  . Rheumatoid arthritis ([facility]) [M06.9] 12/06/2015  . Anemia of chronic disease [D63.8] 03/08/2015   Subjective Data:  56 yo Caucasian female, divorced, lives alone. Referred from [facility]. Self presented there unaccompanied. Had expressed worsening depression. Had expressed feelings of worthlessness and hopelessness. Had expressed thoughts of driving her car to the highway and walk in front of a truck. Her main stressor is chronic pain in her right shoulder. She had been on opoid medication without any benefit. Later expressed hope of getting surgery for her shoulder during this hospitalization. Routine labs was significant for anaemia of chronic illness. No substance use. Patient reports long history of pain. She has had multiple joint replaced over the years. Says some of them needs review. Patient says she has care staff come in to help he with ADL's. Says her pain has been getting worse lately. She has been down in the dumps. Says she is hopeful and wants to get better. Says her family has suggested ALF placement but she does not want to give up her freedom yet. Says she enjoys to take care of  her cat. Her neighbor is looking after her cat now.   Continued Clinical Symptoms:  Alcohol Use Disorder Identification Test Final Score (AUDIT): 0 The "Alcohol Use Disorders Identification Test", Guidelines for Use in Primary Care, Second Edition.  World Science writer [facility]). Score between 0-7:  no or low risk or alcohol related problems. Score between 8-15:  moderate risk of alcohol related problems. Score between 16-19:  high risk of alcohol related problems. Score 20 or above:  warrants further diagnostic evaluation for alcohol dependence and treatment.   CLINICAL FACTORS:  Depression Chronic pain   Musculoskeletal: Strength & Muscle Tone: spastic and decreased Gait & Station: unsteady Patient leans: N/A  Psychiatric Specialty Exam: Physical Exam As in H&P  ROS  Blood pressure 101/62, pulse 80, temperature 98.4 F (36.9 C), resp. rate 16, height 5\' 4"  (1.626 m), weight 46.7 kg (103 lb).Body mass index is 17.68 kg/m.  General Appearance: As in H&P  Eye Contact:  Good  Speech:  Clear and Coherent and Normal Rate  Volume:  Normal  Mood:  Depressed  Affect:  Appropriate and Restricted  Thought Process:  Linear  Orientation:  Full (Time, Place, and Person)  Thought Content:  As in H&P  Suicidal Thoughts:  Yes.  with intent/plan  Homicidal Thoughts:  No  Memory:  Immediate;   Good Recent;   Good Remote;   Good  Judgement:  As in H&P  Insight:  Good  Psychomotor Activity:  Normal  Concentration:  As in H&P  Recall:  As in H&P  Fund of Knowledge:  Good  Language:  Good  Akathisia:  Negative  Handed:    AIMS (if indicated):     Assets:  As in H&P  ADL's:  Impaired  Cognition:  WNL  Sleep:  Number of Hours: 4      COGNITIVE FEATURES THAT CONTRIBUTE TO RISK:  None    SUICIDE RISK:   Moderate:  Frequent suicidal ideation with limited intensity, and duration, some specificity in terms of plans, no associated intent, good self-control, limited  dysphoria/symptomatology, some risk factors present, and identifiable protective factors, including available and accessible social support.  PLAN OF CARE:  As in H&P  I certify that inpatient services furnished can reasonably be expected to improve the patient's condition.   Georgiann Cocker, MD 02/18/2017, 3:02 PM
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
## 2017-02-18 NOTE — Progress Notes (Signed)
Admission Note:  56 year old female who presents, in no acute distress, for the treatment of SI and Depression. Patient verbalizes that she is having a hard time dealing with physical limitations stating "it's even hard for me to take a shower without help".  Patient reports feeling "down and angry".  Prior to admission, patient was SI with a plan to "walk in front of a truck".  Patient contracts for safety on admission.  Patient appears flat and depressed. Patient was calm and cooperative with admission process. Patient denies AVH. Patient identifies "pain" as her main stressor.  Finances is an additional stressor per patient report.  Patient has past medical hx of Osteoporosis, Arthritis, and Psoriasis.  Patient reports that she broke her pelvis in January and March of this year, from a fall, requiring Rehab.  Patient currently lives alone and identifies her neighbor as her support system.  While at [facility], patient would like to work on "Dealing with pain" and "Having a better outlook".  Skin was assessed. Patient has redness all over her body due to psoriasis and peeling on her legs bilateral due to the psoriasis.  Patient searched and no contraband found, POC and unit policies explained and understanding verbalized. Consents obtained. Food and fluids offered and refused. Patient had no additional questions or concerns.

## 2017-02-19 DIAGNOSIS — G4700 Insomnia, unspecified: Secondary | ICD-10-CM

## 2017-02-19 DIAGNOSIS — F322 Major depressive disorder, single episode, severe without psychotic features: Secondary | ICD-10-CM

## 2017-02-19 DIAGNOSIS — F419 Anxiety disorder, unspecified: Secondary | ICD-10-CM

## 2017-02-19 DIAGNOSIS — D649 Anemia, unspecified: Secondary | ICD-10-CM

## 2017-02-19 DIAGNOSIS — G8929 Other chronic pain: Secondary | ICD-10-CM

## 2017-02-19 MED ORDER — PREDNISONE 10 MG PO TABS
10.0000 mg | ORAL_TABLET | Freq: Every day | ORAL | Status: DC
Start: 2017-02-20 — End: 2017-02-19

## 2017-02-19 MED ORDER — OXYCODONE-ACETAMINOPHEN 5-325 MG PO TABS
1.0000 | ORAL_TABLET | Freq: Three times a day (TID) | ORAL | Status: DC | PRN
  Administered 2017-02-19 – 2017-02-21 (×6): 1 via ORAL
  Filled 2017-02-19 (×6): qty 1

## 2017-02-19 MED ORDER — SERTRALINE HCL 50 MG PO TABS
150.0000 mg | ORAL_TABLET | Freq: Every day | ORAL | Status: DC
  Administered 2017-02-19 – 2017-02-21 (×3): 150 mg via ORAL
  Filled 2017-02-19 (×6): qty 1

## 2017-02-19 MED ORDER — TRAZODONE HCL 50 MG PO TABS
25.0000 mg | ORAL_TABLET | Freq: Every evening | ORAL | Status: DC | PRN
  Administered 2017-02-19 – 2017-02-20 (×2): 25 mg via ORAL
  Filled 2017-02-19 (×2): qty 1

## 2017-02-19 MED ORDER — PREDNISONE 10 MG PO TABS
10.0000 mg | ORAL_TABLET | Freq: Every day | ORAL | Status: DC
  Administered 2017-02-19 – 2017-02-21 (×3): 10 mg via ORAL
  Filled 2017-02-19 (×2): qty 1
  Filled 2017-02-19: qty 2
  Filled 2017-02-19 (×3): qty 1

## 2017-02-19 NOTE — Progress Notes (Signed)
Recreation Therapy Notes  Date: 02/19/17 Time: 0930 Location: 300 Hall Group Room  Group Topic: Stress Management  Goal Area(s) Addresses:  Patient will verbalize importance of using healthy stress management.  Patient will identify positive emotions associated with healthy stress management.   Behavioral Response: Engaged  Intervention: Stress Management  Activity :  Meditation.  LRT introduced the stress management technique of meditation.  LRT played a meditation from the Calm app to allow patients to participate in meditating.  Patients were to follow along with the meditation to fully engage in the technique.  Education:  Stress Management, Discharge Planning.   Education Outcome: Acknowledges edcuation/In group clarification offered/Needs additional education  Clinical Observations/Feedback: Pt did not attend group.   Caroll Rancher, LRT/CTRS         Caroll Rancher A 02/19/2017 12:41 PM

## 2017-02-19 NOTE — [facility] Group Notes (Signed)
[facility] LCSW Group Therapy  02/19/2017 1:15pm  Type of Therapy:  Group Therapy vercoming Obstacles  Participation Level:  Minimal  Participation Quality:  Appropriate   Affect:  Appropriate  Cognitive:  Appropriate and Oriented  Insight:  Developing/Improving and Improving  Engagement in Therapy:  Improving  Modes of Intervention:  Discussion, Exploration, Problem-solving and Support  Description of Group:   In this group patients will be encouraged to explore what they see as obstacles to their own wellness and recovery. They will be guided to discuss their thoughts, feelings, and behaviors related to these obstacles. The group will process together ways to cope with barriers, with attention given to specific choices patients can make. Each patient will be challenged to identify changes they are motivated to make in order to overcome their obstacles. This group will be process-oriented, with patients participating in exploration of their own experiences as well as giving and receiving support and challenge from other group members.  Summary of Patient Progress: Pt only participated when prompted. She identified her physical ailments as her primary obstacle.    Therapeutic Modalities:   Cognitive Behavioral Therapy Solution Focused Therapy Motivational Interviewing Relapse Prevention Therapy   Vernie Shanks, LCSW 02/19/2017 4:29 PM

## 2017-02-19 NOTE — Tx Team (Signed)
Interdisciplinary Treatment and Diagnostic Plan Update  02/19/2017 Time of Session: 9:00 AM Lichelle Herschberger MRN: [id number]  Principal Diagnosis: Major Depressive Disorder  Secondary Diagnoses: Active Problems:   MDD (major depressive disorder)   Current Medications:  Current Facility-Administered Medications  Medication Dose Route Frequency Provider Last Rate Last Dose  . acetaminophen (TYLENOL) tablet 650 mg  650 mg Oral Q6H PRN Oneta Rack, NP   650 mg at 02/18/17 0036  . alum & mag hydroxide-simeth (MAALOX/MYLANTA) 200-200-20 MG/5ML suspension 30 mL  30 mL Oral Q4H PRN Oneta Rack, NP      . hydrOXYzine (ATARAX/VISTARIL) tablet 25 mg  25 mg Oral TID PRN Oneta Rack, NP   25 mg at 02/18/17 0036  . magnesium hydroxide (MILK OF MAGNESIA) suspension 30 mL  30 mL Oral Daily PRN Oneta Rack, NP      . traMADol Janean Sark) tablet 50 mg  50 mg Oral Q12H PRN Georgiann Cocker, MD   50 mg at 02/19/17 1129  . traZODone (DESYREL) tablet 50 mg  50 mg Oral QHS PRN Oneta Rack, NP   50 mg at 02/18/17 2127  . venlafaxine XR (EFFEXOR-XR) 24 hr capsule 75 mg  75 mg Oral Daily Izediuno, Delight Ovens, MD   75 mg at 02/19/17 1128   PTA Medications: Prescriptions Prior to Admission  Medication Sig Dispense Refill Last Dose  . calcium-vitamin D (OSCAL WITH D) 500-200 MG-UNIT tablet Take 1 tablet by mouth daily with breakfast.    Past Month at Unknown time  . ferrous sulfate 325 (65 FE) MG tablet Take 1 tablet (325 mg total) by mouth 2 (two) times daily with a meal. (Patient taking differently: Take 325 mg by mouth daily with breakfast. ) 30 tablet 0 Past Month at Unknown time  . fluocinonide cream (LIDEX) 0.05 % Apply 1 application topically 2 (two) times daily as needed (for psorasis).   Past Month at Unknown time  . folic acid (FOLVITE) 1 MG tablet Take 1 mg by mouth daily.  0 Past Month at Unknown time  . lisinopril (PRINIVIL,ZESTRIL) 40 MG tablet Take 40 mg by mouth daily.   Past Month  at Unknown time  . oxyCODONE-acetaminophen (PERCOCET) 7.5-325 MG tablet Take 1 tablet by mouth every 4 (four) hours as needed for severe pain.   Past Week at Unknown time  . predniSONE (DELTASONE) 10 MG tablet Take 1 tablet (10 mg total) by mouth daily with breakfast. 30 tablet 0 Past Month at Unknown time  . sertraline (ZOLOFT) 100 MG tablet Take 100 mg by mouth daily.   Past Month at Unknown time  . zolpidem (AMBIEN) 5 MG tablet Take 10 mg by mouth at bedtime as needed for sleep.   Past Month at Unknown time  . Tofacitinib Citrate (XELJANZ XR) 11 MG TB24 Take 11 mg by mouth daily.   Not Taking at Unknown time    Patient Stressors: Financial difficulties Health problems Other: Pain  Patient Strengths: Ability for insight Average or above average intelligence Capable of independent living Communication skills Motivation for treatment/growth Supportive family/friends  Treatment Modalities: Medication Management, Group therapy, Case management,  1 to 1 session with clinician, Psychoeducation, Recreational therapy.   Physician Treatment Plan for Primary Diagnosis: Major Depressive Disorder Long Term Goal(s): Improvement in symptoms so as ready for discharge Improvement in symptoms so as ready for discharge   Short Term Goals: Ability to identify changes in lifestyle to reduce recurrence of condition will improve Ability to  verbalize feelings will improve Ability to disclose and discuss suicidal ideas Ability to demonstrate self-control will improve Ability to identify and develop effective coping behaviors will improve Ability to maintain clinical measurements within normal limits will improve Compliance with prescribed medications will improve Ability to identify triggers associated with substance abuse/mental health issues will improve Ability to identify changes in lifestyle to reduce recurrence of condition will improve Ability to verbalize feelings will improve Ability to  disclose and discuss suicidal ideas Ability to demonstrate self-control will improve Ability to identify and develop effective coping behaviors will improve Ability to maintain clinical measurements within normal limits will improve Compliance with prescribed medications will improve Ability to identify triggers associated with substance abuse/mental health issues will improve  Medication Management: Evaluate patient's response, side effects, and tolerance of medication regimen.  Therapeutic Interventions: 1 to 1 sessions, Unit Group sessions and Medication administration.  Evaluation of Outcomes: Progressing  Physician Treatment Plan for Secondary Diagnosis: Active Problems:   MDD (major depressive disorder)  Long Term Goal(s): Improvement in symptoms so as ready for discharge Improvement in symptoms so as ready for discharge   Short Term Goals: Ability to identify changes in lifestyle to reduce recurrence of condition will improve Ability to verbalize feelings will improve Ability to disclose and discuss suicidal ideas Ability to demonstrate self-control will improve Ability to identify and develop effective coping behaviors will improve Ability to maintain clinical measurements within normal limits will improve Compliance with prescribed medications will improve Ability to identify triggers associated with substance abuse/mental health issues will improve Ability to identify changes in lifestyle to reduce recurrence of condition will improve Ability to verbalize feelings will improve Ability to disclose and discuss suicidal ideas Ability to demonstrate self-control will improve Ability to identify and develop effective coping behaviors will improve Ability to maintain clinical measurements within normal limits will improve Compliance with prescribed medications will improve Ability to identify triggers associated with substance abuse/mental health issues will improve      Medication Management: Evaluate patient's response, side effects, and tolerance of medication regimen.  Therapeutic Interventions: 1 to 1 sessions, Unit Group sessions and Medication administration.  Evaluation of Outcomes: Progressing   RN Treatment Plan for Primary Diagnosis: Major Depressive Disorder Long Term Goal(s): Knowledge of disease and therapeutic regimen to maintain health will improve  Short Term Goals: Ability to participate in decision making will improve, Ability to verbalize feelings will improve, Ability to disclose and discuss suicidal ideas and Ability to identify and develop effective coping behaviors will improve  Medication Management: RN will administer medications as ordered by provider, will assess and evaluate patient's response and provide education to patient for prescribed medication. RN will report any adverse and/or side effects to prescribing provider.  Therapeutic Interventions: 1 on 1 counseling sessions, Psychoeducation, Medication administration, Evaluate responses to treatment, Monitor vital signs and CBGs as ordered, Perform/monitor CIWA, COWS, AIMS and Fall Risk screenings as ordered, Perform wound care treatments as ordered.  Evaluation of Outcomes: Progressing   LCSW Treatment Plan for Primary Diagnosis: Major Depressive Disorder Long Term Goal(s): Safe transition to appropriate next level of care at discharge, Engage patient in therapeutic group addressing interpersonal concerns.  Short Term Goals: Engage patient in aftercare planning with referrals and resources, Increase ability to appropriately verbalize feelings, Identify triggers associated with mental health/substance abuse issues and Increase skills for wellness and recovery  Therapeutic Interventions: Assess for all discharge needs, 1 to 1 time with Social worker, Explore available resources and support systems, Assess  for adequacy in community support network, Educate family and  significant other(s) on suicide prevention, Complete Psychosocial Assessment, Interpersonal group therapy.  Evaluation of Outcomes: Progressing   Progress in Treatment: Attending groups: Yes. Participating in groups: Yes. limited participation due to chronic pain issues Taking medication as prescribed: Yes. Toleration medication: Yes. Family/Significant other contact made: No, will contact:  collateral w patient consent Patient understands diagnosis: Yes. Discussing patient identified problems/goals with staff: Yes.   Medical problems stabilized or resolved: No. and As evidenced by:  patient admitted w chronic pain which she identifies as significant stressor and trigger for suicidal thinking Denies suicidal/homicidal ideation: No. and As evidenced by:  see above re chronic pain and suicidal thinking Issues/concerns per patient self-inventory: No. Other: na  New problem(s) identified: Yes, Describe:  CSW has been asked to provide list of assisted living facilities, CSW to assess patient payor source for possible housing placement  New Short Term/Long Term Goal(s):  Assess for housing resources and additional community support  Discharge Plan or Barriers: patient states she has difficulty living alone and meeting her needs  Reason for Continuation of Hospitalization: Depression Medical Issues Suicidal ideation  Estimated Length of Stay:  3 - 5 days, estimate DC 02/22/17  Attendees: Patient: 02/19/2017 1:50 PM  Physician: Sallyanne Havers MD 02/19/2017 1:50 PM  Nursing: Ricard Dillon, Ronnie RN 02/19/2017 1:50 PM  RN Care Manager: 02/19/2017 1:50 PM  Social Worker: Governor Rooks LCSW 02/19/2017 1:50 PM  Recreational Therapist:  02/19/2017 1:50 PM  Other:  02/19/2017 1:50 PM  Other:  02/19/2017 1:50 PM  Other: 02/19/2017 1:50 PM    Scribe for Treatment Team: Sallee Lange, LCSW 02/19/2017 1:50 PM

## 2017-02-19 NOTE — [facility] Group Notes (Signed)
[facility] LCSW Aftercare Discharge Planning Group Note   Date/time:  02/19/2017 8:45 - 9:15 AM  Group Description:  Discharge planning group reviews patient's anticipated discharge plans and assists patients to anticipate and address any barriers to wellness/recovery in the community.  Suicide prevention education is reviewed with patients in group.  Participation Quality:  Invited, chose not to attend  Santa Genera, LCSW Lead Clinical Social Worker Phone:  [phone]

## 2017-02-19 NOTE — Progress Notes (Addendum)
[facility] MD Progress Note  02/19/2017 4:31 PM Tara Brown  MRN:  [id number] Subjective:  Patient states she is feeling depressed, which she attributes at least in part to ongoing , chronic pain associated with her RA.  Objective : I have discussed case with treatment team and have met with patient. Patient is 56 year old female, known to our unit from prior psychiatric admissions for depression. She has a history of severe, debilitating Arthritis, which has led to significant difficulty in her ADLs and living independently, and has resulted in chronic pain . She presented voluntarily for worsening depression and suicidal ideations of walking into traffic. Patient has been encouraged in the past to consider an ALF or SNF facility, but she has been resistant to give up her independent living . She states that at this time she does have a home health aide who helps her out regularly . She presents pleasant, cooperative, depressed, but smiles at times appropriately. Presents depressed , in pain. Patient states she has been on maintenance Prednisone dose for years ( 10 mgrs daily ) and on Percocet every 8 hours for her severe pain. No disruptive behaviors on unit, isolates in room Principal Problem:  MDD  Diagnosis:   Patient Active Problem List   Diagnosis Date Noted  . MDD (major depressive disorder) [F32.9] 02/17/2017  . Osteoporosis [M81.0] 05/29/2016  . HTN (hypertension) [I10] 05/29/2016  . MDD (major depressive disorder), recurrent severe, without psychosis ([place]) [F33.2] 05/28/2016  . Rheumatoid arthritis ([place]) [M06.9] 12/06/2015  . Anemia of chronic disease [D63.8] 03/08/2015   Total Time spent with patient: 20 minutes  Past Medical History:  Past Medical History:  Diagnosis Date  . Anemia   . Arthritis   . Major depressive disorder, recurrent, severe without psychotic features ([address]) 03/11/2015  . Osteoporosis     Past Surgical History:  Procedure Laterality Date  . AMPUTATION Left  12/07/2015   Procedure: AMPUTATION LEFT GREAT TOE;  Surgeon: Newt Minion, MD;  Location: [facility];  Service: Orthopedics;  Laterality: Left;  . FOOT SURGERY    . HAND SURGERY    . HIP FRACTURE SURGERY    . KNEE SURGERY    . NECK SURGERY     Family History:  Family History  Problem Relation Age of Onset  . Bipolar disorder Sister   . Suicidality Other   . Schizophrenia Other   . Stroke Neg Hx    Social History:  History  Alcohol Use No     History  Drug Use No    Social History   Social History  . Marital status: Divorced    Spouse name: N/A  . Number of children: N/A  . Years of education: N/A   Social History Main Topics  . Smoking status: Never Smoker  . Smokeless tobacco: Never Used  . Alcohol use No  . Drug use: No  . Sexual activity: No   Other Topics Concern  . None   Social History Narrative  . None   Additional Social History:   Sleep: Fair  Appetite:  Fair  Current Medications: Current Facility-Administered Medications  Medication Dose Route Frequency Provider Last Rate Last Dose  . acetaminophen (TYLENOL) tablet 650 mg  650 mg Oral Q6H PRN Derrill Center, NP   650 mg at 02/18/17 0036  . alum & mag hydroxide-simeth (MAALOX/MYLANTA) 200-200-20 MG/5ML suspension 30 mL  30 mL Oral Q4H PRN Derrill Center, NP      . hydrOXYzine (ATARAX/VISTARIL) tablet  25 mg  25 mg Oral TID PRN Derrill Center, NP   25 mg at 02/18/17 0036  . magnesium hydroxide (MILK OF MAGNESIA) suspension 30 mL  30 mL Oral Daily PRN Derrill Center, NP      . oxyCODONE-acetaminophen (PERCOCET/ROXICET) 5-325 MG per tablet 1 tablet  1 tablet Oral Q8H PRN Cobos, Myer Peer, MD      . predniSONE (DELTASONE) tablet 10 mg  10 mg Oral Q breakfast Cobos, Myer Peer, MD      . sertraline (ZOLOFT) tablet 150 mg  150 mg Oral Daily Cobos, Myer Peer, MD      . traZODone (DESYREL) tablet 25 mg  25 mg Oral QHS PRN Cobos, Myer Peer, MD        Lab Results:  Results for orders placed or  performed during the hospital encounter of 02/17/17 (from the past 48 hour(s))  Comprehensive metabolic panel     Status: Abnormal   Collection Time: 02/17/17  4:50 PM  Result Value Ref Range   Sodium 137 135 - 145 mmol/L   Potassium 3.5 3.5 - 5.1 mmol/L   Chloride 104 101 - 111 mmol/L   CO2 24 22 - 32 mmol/L   Glucose, Bld 131 (H) 65 - 99 mg/dL   BUN 13 6 - 20 mg/dL   Creatinine, Ser 0.82 0.44 - 1.00 mg/dL   Calcium 9.1 8.9 - 10.3 mg/dL   Total Protein 7.8 6.5 - 8.1 g/dL   Albumin 3.3 (L) 3.5 - 5.0 g/dL   AST 14 (L) 15 - 41 U/L   ALT 6 (L) 14 - 54 U/L   Alkaline Phosphatase 88 38 - 126 U/L   Total Bilirubin 0.4 0.3 - 1.2 mg/dL   GFR calc non Af Amer >60 >60 mL/min   GFR calc Af Amer >60 >60 mL/min    Comment: (NOTE) The eGFR has been calculated using the CKD EPI equation. This calculation has not been validated in all clinical situations. eGFR's persistently <60 mL/min signify possible Chronic Kidney Disease.    Anion gap 9 5 - 15  Ethanol     Status: None   Collection Time: 02/17/17  4:50 PM  Result Value Ref Range   Alcohol, Ethyl (B) <5 <5 mg/dL    Comment:        LOWEST DETECTABLE LIMIT FOR SERUM ALCOHOL IS 5 mg/dL FOR MEDICAL PURPOSES ONLY   Salicylate level     Status: None   Collection Time: 02/17/17  4:50 PM  Result Value Ref Range   Salicylate Lvl <3.6 2.8 - 30.0 mg/dL  Acetaminophen level     Status: Abnormal   Collection Time: 02/17/17  4:50 PM  Result Value Ref Range   Acetaminophen (Tylenol), Serum <10 (L) 10 - 30 ug/mL    Comment:        THERAPEUTIC CONCENTRATIONS VARY SIGNIFICANTLY. A RANGE OF 10-30 ug/mL MAY BE AN EFFECTIVE CONCENTRATION FOR MANY PATIENTS. HOWEVER, SOME ARE BEST TREATED AT CONCENTRATIONS OUTSIDE THIS RANGE. ACETAMINOPHEN CONCENTRATIONS >150 ug/mL AT 4 HOURS AFTER INGESTION AND >50 ug/mL AT 12 HOURS AFTER INGESTION ARE OFTEN ASSOCIATED WITH TOXIC REACTIONS.   cbc     Status: Abnormal   Collection Time: 02/17/17  4:50 PM   Result Value Ref Range   WBC 7.1 4.0 - 10.5 K/uL   RBC 3.59 (L) 3.87 - 5.11 MIL/uL   Hemoglobin 8.9 (L) 12.0 - 15.0 g/dL   HCT 29.1 (L) 36.0 - 46.0 %   MCV 81.1  78.0 - 100.0 fL   MCH 24.8 (L) 26.0 - 34.0 pg   MCHC 30.6 30.0 - 36.0 g/dL   RDW 17.2 (H) 11.5 - 15.5 %   Platelets 349 150 - 400 K/uL    Blood Alcohol level:  Lab Results  Component Value Date   ETH <5 02/17/2017   ETH <5 17/00/1749    Metabolic Disorder Labs: No results found for: HGBA1C, MPG No results found for: PROLACTIN No results found for: CHOL, TRIG, HDL, CHOLHDL, VLDL, LDLCALC  Physical Findings: AIMS: Facial and Oral Movements Muscles of Facial Expression: None, normal Lips and Perioral Area: None, normal Jaw: None, normal Tongue: None, normal,Extremity Movements Upper (arms, wrists, hands, fingers): None, normal Lower (legs, knees, ankles, toes): None, normal, Trunk Movements Neck, shoulders, hips: None, normal, Overall Severity Severity of abnormal movements (highest score from questions above): None, normal Incapacitation due to abnormal movements: None, normal Patient's awareness of abnormal movements (rate only patient's report): No Awareness, Dental Status Current problems with teeth and/or dentures?: No Does patient usually wear dentures?: No  CIWA:    COWS:     Musculoskeletal: Strength & Muscle Tone: affected by debilitating arthritis  Gait & Station: walks slowly, with difficulty due to pain  Patient leans: N/A  Psychiatric Specialty Exam: Physical Exam  ROS chronic arthritis resulting in pain , limited ability in ADLs.  Denies chest pain, no dyspnea .   Blood pressure 97/60, pulse 97, temperature 99 F (37.2 C), temperature source Oral, resp. rate 16, height _0  (1.626 m), weight 46.7 kg (103 lb).Body mass index is 17.68 kg/m.  General Appearance: Fairly Groomed  Eye Contact:  Good  Speech:  Normal Rate  Volume:  Decreased  Mood:  depressed  Affect:  constricted, but  reactive, smiles briefly at times during session  Thought Process:  Linear and Descriptions of Associations: Intact  Orientation:  Other:  fully alert and attentive  Thought Content:  denies hallucinations, no delusions, not internally preoccupied   Suicidal Thoughts:  No denies any active suicidal or self injurious ideations at this time and contracts for safety on unit, denies homicidal or violent ideations   Homicidal Thoughts:  No  Memory:  recent and remote grossly intact   Judgement:  Fair  Insight:  Fair  Psychomotor Activity:  Decreased  Concentration:  Concentration: Good and Attention Span: Good  Recall:  Good  Fund of Knowledge:  Good  Language:  Good  Akathisia:  Negative  Handed:  Right  AIMS (if indicated):     Assets:  Communication Skills Desire for Improvement Resilience  ADL's:  Fair   Cognition:  WNL  Sleep:  Number of Hours: 6.5   Assessment - patient presents depressed, constricted in affect, anxious.  She denies suicidal ideations. Her depression is at least partly related to severe RA which has caused significant difficulties in daily functioning . We discussed medication issues , patient states she has been on Zoloft for a long time, feels it helps, albeit partially. Would want to continue Zoloft trial rather than another antidepressant at this time. Of note, patient has been on Prednisone 10 mgrs QDAY as maintenance for more than two years, as per her report  Treatment Plan Summary: Daily contact with patient to assess and evaluate symptoms and progress in treatment, Medication management, Plan inpatient admission and medications as below. Encourage group and milieu participation to work on coping skills and symptom reduction D/C Effexor XR D/C Ultram Restart Zoloft at 150 mgrs QDAY for  depression, anxiety ( had been on 100 mgrs daily prior to admission)  Decrease Trazodone to 25 mgrs QHS PRN for insomnia to minimize sedation Patient has been on Oxycodone  for long period of time for chronic  pain associated with severe RA. Denies abusing , misusing . Restart Oxycodone 5 mgrs Q 8 hours PRN for pain. Repeat CBC to follow up on Anemia.  Have requested Nutrition and PT consultations  Treatment team working on disposition planning options  Jenne Campus, MD 02/19/2017, 4:31 PM

## 2017-02-19 NOTE — Progress Notes (Signed)
D Pt. Denies admits passive SI, contracts for safety, denies HI. Chronic pain d/t osteoarthritis.  A Writer offered support and encouragement.  R Pt. Remains safe on the unit.  Pt. Rates her day a 3 depression a 7 anxiety a 6 and anger a 6 due to not getting her pain medication.  Writer requested the pt.'s pain medication again this pm without success.

## 2017-02-19 NOTE — Progress Notes (Signed)
Patient denies SI, HI and AVH, but endorses depressed mood and complains of chronic pain.  Patient states that she has been feeling down due to increased isolation because of her loss of mobility due to arthritis.  Patient has been engaged in groups and noted to be engaged with peers.   Assess patient for safety, offer medications as prescribed, engage patient in 1:1 staff talks, encourage patient to use coping skills.   Continue to monitor as prescribed, patient able to contract for safety.

## 2017-02-20 LAB — CBC WITH DIFFERENTIAL/PLATELET
Basophils Absolute: 0 10*3/uL (ref 0.0–0.1)
Basophils Relative: 0 %
Eosinophils Absolute: 0 10*3/uL (ref 0.0–0.7)
Eosinophils Relative: 0 %
HCT: 28.7 % — ABNORMAL LOW (ref 36.0–46.0)
Hemoglobin: 8.7 g/dL — ABNORMAL LOW (ref 12.0–15.0)
Lymphocytes Relative: 37 %
Lymphs Abs: 1.7 10*3/uL (ref 0.7–4.0)
MCH: 23.8 pg — ABNORMAL LOW (ref 26.0–34.0)
MCHC: 30.3 g/dL (ref 30.0–36.0)
MCV: 78.4 fL (ref 78.0–100.0)
Monocytes Absolute: 0.3 10*3/uL (ref 0.1–1.0)
Monocytes Relative: 6 %
Neutro Abs: 2.7 10*3/uL (ref 1.7–7.7)
Neutrophils Relative %: 57 %
Platelets: 423 10*3/uL — ABNORMAL HIGH (ref 150–400)
RBC: 3.66 MIL/uL — ABNORMAL LOW (ref 3.87–5.11)
RDW: 16.7 % — ABNORMAL HIGH (ref 11.5–15.5)
WBC: 4.6 10*3/uL (ref 4.0–10.5)

## 2017-02-20 LAB — TSH: TSH: 0.476 u[IU]/mL (ref 0.350–4.500)

## 2017-02-20 MED ORDER — ENSURE ENLIVE PO LIQD
237.0000 mL | Freq: Two times a day (BID) | ORAL | Status: DC
  Administered 2017-02-20 – 2017-02-21 (×3): 237 mL via ORAL

## 2017-02-20 NOTE — [facility] Group Notes (Signed)
[facility] Group Notes:  (Nursing/MHT/Case Management/Adjunct)  Date:  02/20/2017  Time:  0900 am  Type of Therapy:  Psychoeducational Skills  Participation Level:  Minimal  Participation Quality:  Inattentive  Affect:  Flat  Cognitive:  Lacking  Insight:  Lacking  Engagement in Group:  Resistant  Modes of Intervention:  Support  Summary of Progress/Problems: Patient came in late to group, however, she listened attentively.   [place] Mon 02/20/2017, 5:57 PM

## 2017-02-20 NOTE — Progress Notes (Signed)
Patient ID: Tara Brown, female   DOB: 1961-04-30, 56 y.o.   MRN: [id number]  D: Patient denies SI/HI and auditory and visual hallucinations. Bright on approach. Talkative. Rated depression 5 and anxiety 4.  A: Patient given emotional support from RN. Patient given medications per MD orders. Patient encouraged to attend groups and unit activities. Patient encouraged to come to staff with any questions or concerns.  R: Patient remains cooperative and appropriate. Will continue to monitor patient for safety.

## 2017-02-20 NOTE — Progress Notes (Signed)
Adult Psychoeducational Group Note  Date:  02/20/2017 Time:  12:05 AM  Group Topic/Focus:  Wrap-Up Group:   The focus of this group is to help patients review their daily goal of treatment and discuss progress on daily workbooks.  Participation Level:  Active  Participation Quality:  Appropriate, Attentive, Sharing and Supportive  Affect:  Appropriate  Cognitive:  Appropriate  Insight: Appropriate and Good  Engagement in Group:  Developing/Improving  Modes of Intervention:  Discussion  Additional Comments:  Pt shared during group that her medications have been "clarified" and they are working good. Pt stated she is going to follow up with her plans for outpatient when she discharge.  Tara Brown 02/20/2017, 12:05 AM

## 2017-02-20 NOTE — [facility] Group Notes (Signed)
[facility] LCSW Group Therapy 02/20/2017 1:15 PM  Type of Therapy: Group Therapy- Feelings about Diagnosis  Participation Level: Pt was present for the duration of the group. Did not participate in the discussion.  Jonathon Jordan, MSW, Theresia Majors [phone] 02/20/2017 4:35 PM

## 2017-02-20 NOTE — Progress Notes (Signed)
[facility] MD Progress Note  02/20/2017 8:12 PM Eddith Mentor  MRN:  [id number] Subjective:  Patient reports she is feeling better, less depressed, which she attributes in part to being back on opiate analgesic management,improved pain. Denies suicidal ideations at this time. She is future oriented, and is planning on returning home after discharge- resistant to consider SNF or ALF at this time.  Denies medication side effects. Objective : I have discussed case with treatment team and have met with patient. As discussed with staff, patient presenting with brighter affect, less isolative . Patient presents with improving mood and range of affect, less severely depressed today. More visible on unit. Denies suicidal ideations. Denies medication side effects.  Principal Problem:  MDD  Diagnosis:   Patient Active Problem List   Diagnosis Date Noted  . MDD (major depressive disorder) [F32.9] 02/17/2017  . Osteoporosis [M81.0] 05/29/2016  . HTN (hypertension) [I10] 05/29/2016  . MDD (major depressive disorder), recurrent severe, without psychosis ([place]) [F33.2] 05/28/2016  . Rheumatoid arthritis ([place]) [M06.9] 12/06/2015  . Anemia of chronic disease [D63.8] 03/08/2015   Total Time spent with patient: 20 minutes  Past Medical History:  Past Medical History:  Diagnosis Date  . Anemia   . Arthritis   . Major depressive disorder, recurrent, severe without psychotic features ([place]) 03/11/2015  . Osteoporosis     Past Surgical History:  Procedure Laterality Date  . AMPUTATION Left 12/07/2015   Procedure: AMPUTATION LEFT GREAT TOE;  Surgeon: Newt Minion, MD;  Location: [facility];  Service: Orthopedics;  Laterality: Left;  . FOOT SURGERY    . HAND SURGERY    . HIP FRACTURE SURGERY    . KNEE SURGERY    . NECK SURGERY     Family History:  Family History  Problem Relation Age of Onset  . Bipolar disorder Sister   . Suicidality Other   . Schizophrenia Other   . Stroke Neg Hx    Social History:   History  Alcohol Use No     History  Drug Use No    Social History   Social History  . Marital status: Divorced    Spouse name: N/A  . Number of children: N/A  . Years of education: N/A   Social History Main Topics  . Smoking status: Never Smoker  . Smokeless tobacco: Never Used  . Alcohol use No  . Drug use: No  . Sexual activity: No   Other Topics Concern  . None   Social History Narrative  . None   Additional Social History:   Sleep: Good  Appetite:  Good  Current Medications: Current Facility-Administered Medications  Medication Dose Route Frequency Provider Last Rate Last Dose  . acetaminophen (TYLENOL) tablet 650 mg  650 mg Oral Q6H PRN Derrill Center, NP   650 mg at 02/18/17 0036  . alum & mag hydroxide-simeth (MAALOX/MYLANTA) 200-200-20 MG/5ML suspension 30 mL  30 mL Oral Q4H PRN Derrill Center, NP      . feeding supplement (ENSURE ENLIVE) (ENSURE ENLIVE) liquid 237 mL  237 mL Oral BID BM Cobos, Myer Peer, MD   237 mL at 02/20/17 1353  . hydrOXYzine (ATARAX/VISTARIL) tablet 25 mg  25 mg Oral TID PRN Derrill Center, NP   25 mg at 02/18/17 0036  . magnesium hydroxide (MILK OF MAGNESIA) suspension 30 mL  30 mL Oral Daily PRN Derrill Center, NP      . oxyCODONE-acetaminophen (PERCOCET/ROXICET) 5-325 MG per tablet 1 tablet  1  tablet Oral Q8H PRN Cobos, Myer Peer, MD   1 tablet at 02/20/17 1354  . predniSONE (DELTASONE) tablet 10 mg  10 mg Oral Q breakfast Cobos, Myer Peer, MD   10 mg at 02/20/17 0825  . sertraline (ZOLOFT) tablet 150 mg  150 mg Oral Daily Cobos, Myer Peer, MD   150 mg at 02/20/17 0825  . traZODone (DESYREL) tablet 25 mg  25 mg Oral QHS PRN Cobos, Myer Peer, MD   25 mg at 02/19/17 2101    Lab Results:  Results for orders placed or performed during the hospital encounter of 02/17/17 (from the past 48 hour(s))  CBC with Differential/Platelet     Status: Abnormal   Collection Time: 02/20/17  6:22 AM  Result Value Ref Range   WBC 4.6 4.0 -  10.5 K/uL   RBC 3.66 (L) 3.87 - 5.11 MIL/uL   Hemoglobin 8.7 (L) 12.0 - 15.0 g/dL   HCT 28.7 (L) 36.0 - 46.0 %   MCV 78.4 78.0 - 100.0 fL   MCH 23.8 (L) 26.0 - 34.0 pg   MCHC 30.3 30.0 - 36.0 g/dL   RDW 16.7 (H) 11.5 - 15.5 %   Platelets 423 (H) 150 - 400 K/uL   Neutrophils Relative % 57 %   Neutro Abs 2.7 1.7 - 7.7 K/uL   Lymphocytes Relative 37 %   Lymphs Abs 1.7 0.7 - 4.0 K/uL   Monocytes Relative 6 %   Monocytes Absolute 0.3 0.1 - 1.0 K/uL   Eosinophils Relative 0 %   Eosinophils Absolute 0.0 0.0 - 0.7 K/uL   Basophils Relative 0 %   Basophils Absolute 0.0 0.0 - 0.1 K/uL    Comment: Performed at [facility], [place] [address], [place], [place] [address]  TSH     Status: None   Collection Time: 02/20/17  6:22 AM  Result Value Ref Range   TSH 0.476 0.350 - 4.500 uIU/mL    Comment: Performed by a 3rd Generation assay with a functional sensitivity of <=0.01 uIU/mL. Performed at [facility], [place] [address], [place], [place] [address]     Blood Alcohol level:  Lab Results  Component Value Date   ETH <5 02/17/2017   ETH <5 62/11/5595    Metabolic Disorder Labs: No results found for: HGBA1C, MPG No results found for: PROLACTIN No results found for: CHOL, TRIG, HDL, CHOLHDL, VLDL, LDLCALC  Physical Findings: AIMS: Facial and Oral Movements Muscles of Facial Expression: None, normal Lips and Perioral Area: None, normal Jaw: None, normal Tongue: None, normal,Extremity Movements Upper (arms, wrists, hands, fingers): None, normal Lower (legs, knees, ankles, toes): None, normal, Trunk Movements Neck, shoulders, hips: None, normal, Overall Severity Severity of abnormal movements (highest score from questions above): None, normal Incapacitation due to abnormal movements: None, normal Patient's awareness of abnormal movements (rate only patient's report): No Awareness, Dental Status Current problems with teeth and/or dentures?: No Does  patient usually wear dentures?: No  CIWA:    COWS:     Musculoskeletal: Strength & Muscle Tone: affected by debilitating arthritis  Gait & Station: gait a little better today Patient leans: N/A  Psychiatric Specialty Exam: Physical Exam  ROS chronic pain and difficulties with mobility related to RA   Blood pressure 110/71, pulse 89, temperature 98.8 F (37.1 C), resp. rate 16, height 5' 4"  (1.626 m), weight 46.7 kg (103 lb).Body mass index is 17.68 kg/m.  General Appearance: Fairly Groomed  Eye Contact:  Good  Speech:  Normal Rate  Volume:  Normal  Mood:  less depressed , feeling better today  Affect:  more reactive, smiles at times appropriately   Thought Process:  Goal Directed and Descriptions of Associations: Intact  Orientation:  Full (Time, Place, and Person)  Thought Content:  no hallucinations, no delusions, not internally preoccupied   Suicidal Thoughts:  No denies any active suicidal or self injurious ideations at this time and contracts for safety on unit, denies homicidal or violent ideations   Homicidal Thoughts:  No  Memory:  recent and remote grossly improving  Judgement:  Good  Insight:  Good  Psychomotor Activity:  Decreased- but improving compared to yesterday  Concentration:  Concentration: Good and Attention Span: Good  Recall:  Good  Fund of Knowledge:  Good  Language:  Good  Akathisia:  No  Handed:  Right  AIMS (if indicated):     Assets:  Communication Skills Desire for Improvement Resilience  ADL's:  Fair- improving   Cognition:  WNL  Sleep:  Number of Hours: 6.25   Assessment -patient presenting with partially improved mood and range of affect. She attributes this to decreased severity of pain, on current Oxycodone management. States she is on chronic opiate management due to chronic pain associated to her severe RA. As she improves she is more future oriented and currently denies any SI. Wants to return home after discharge.  Treatment Plan  Summary: Treatment team reviewed today 6/12 as below Daily contact with patient to assess and evaluate symptoms and progress in treatment, Medication management, Plan inpatient admission and medications as below. Encourage group and milieu participation to work on Radiographer, therapeutic and symptom reduction Continue Zoloft  150 mgrs QDAY for depression, anxiety ( had been on 100 mgrs daily prior to admission)  Continue Trazodone 25 mgrs QHS PRN for insomnia to minimize sedation Continue  Oxycodone 5 mgrs Q 8 hours PRN for pain. Treatment team working on disposition planning options  Jenne Campus, MD 02/20/2017, 8:12 PM   Patient ID: Marlis Edelson, female   DOB: [birth year], 56 y.o.   MRN: [id number]

## 2017-02-20 NOTE — Progress Notes (Signed)
Recreation Therapy Notes  Animal-Assisted Activity (AAA) Program Checklist/Progress Notes Patient Eligibility Criteria Checklist & Daily Group note for Rec TxIntervention  Date: 02/20/2017 Time: 2:55pm Location: 400 hall dayroom  AAA/T Program Assumption of Risk Form signed by Patient/ or Parent Legal Guardian Yes  Patient is free of allergies or sever asthma Yes  Patient reports no fear of animals Yes  Patient reports no history of cruelty to animals Yes  Patient understands his/her participation is voluntary Yes  Patient washes hands before animal contact Yes  Patient washes hands after animal contact Yes  Behavioral Response: engaged  Education:Hand Washing, Appropriate Animal Interaction   Education Outcome: Acknowledges education.   Clinical Observations/Feedback: Patient attended session and interacted appropriately with therapy dog and peers. Patient asked appropriate questions about therapy dog and his training.    Marvell Fuller, Recreation Therapy Intern

## 2017-02-20 NOTE — Progress Notes (Signed)
Tara Brown had been up and visible in milieu this evening, did attend and participate in evening group activity. She spoke of her day and spoke about how she was relieved that she was able to get her medications straight, she spoke about feeling better and also spoke about how she is hopeful for discharge in a couple days. Tara Brown received bedtime medication without incident this evening. A. Support and encouragement provided. R. Safety maintained, will continue to monitor.

## 2017-02-20 NOTE — Progress Notes (Signed)
Physical Therapy Note  Patient Details Name: Tara Brown MRN: [id number] DOB: June 20, 1961   Pt is a 56 year old female admitted to [facility] for suicidal ideation with hx of MDD, RA, and L great toe amputation.  Chart reviewed and PT spoke with pt.  Pt ambulated into hallway from her room with antalgic gait likely due to reported need for L TKA.  Pt also reports she needs R shoulder surgery however is not in the right state mentally to have surgery currently.  Pt with severe hand/wrist and feet deformity which limits her daily life.  She has been seen by PT and OT in the past and received adaptive equipment from OT which she reports she still has.  Pt also has bil platform RW but prefers not to use this.  Pt states she has a wheelchair on loan through November if needed.  Pt reports "broken pelvis" last year for which she received HHPT however did not feel this benefited her very much.  Pt states she receives assist for ADLs from home health aide 4 hrs every day from an agency however agency does not provide assist to appointments.  Pt reports she is mostly homebound as she has difficulty obtaining transportation and occasionally has to cancel doctor appointments (able to obtain groceries through delivery).  Recommended pt continue any exercises she performed last year during HHPT to continue to keep strength and ROM as able.  No further equipment recommendations.  No further inpatient follow up recommended, however if possible, pt may benefit from aquatic therapy upon d/c but would likely need transportation assistance.   PT to sign off.  Arvie Bartholomew,KATHrine E 02/20/2017, 11:33 AM Zenovia Jarred, PT, DPT 02/20/2017 Pager: [phone]

## 2017-02-21 MED ORDER — HYDROXYZINE HCL 25 MG PO TABS: 25.0000 mg | ORAL_TABLET | Freq: Three times a day (TID) | ORAL | 0 refills | Status: DC | PRN

## 2017-02-21 MED ORDER — TRAZODONE 25 MG HALF TABLET
25.0000 mg | ORAL_TABLET | Freq: Every evening | ORAL | Status: DC | PRN
  Administered 2017-02-21: 25 mg via ORAL
  Filled 2017-02-21 (×5): qty 1

## 2017-02-21 MED ORDER — SERTRALINE HCL 50 MG PO TABS: 150.0000 mg | ORAL_TABLET | Freq: Every day | ORAL | 0 refills | Status: DC

## 2017-02-21 MED ORDER — TRAZODONE HCL 50 MG PO TABS: ORAL_TABLET | ORAL | 0 refills | Status: DC

## 2017-02-21 MED ORDER — OXYCODONE-ACETAMINOPHEN 5-325 MG PO TABS: 1.0000 | ORAL_TABLET | Freq: Three times a day (TID) | ORAL | 0 refills | Status: DC | PRN

## 2017-02-21 MED ORDER — PREDNISONE 10 MG PO TABS: 10.0000 mg | ORAL_TABLET | Freq: Every day | ORAL | 0 refills | Status: DC

## 2017-02-21 NOTE — Progress Notes (Signed)
  [facility] Adult Case Management Discharge Plan :  Will you be returning to the same living situation after discharge:  Yes,  pt returning home. At discharge, do you have transportation home?: Yes,  pt has access to transportation. Do you have the ability to pay for your medications: Yes,  pt has insurance.  Release of information consent forms completed and in the chart;  Patient's signature needed at discharge.  Patient to Follow up at: Follow-up Information    [facility] Follow up on 02/27/2017.   Specialty:  Behavioral Health Why:  Assessment appointment at 8:45am for intensive outpatient. Please arrive 15 minutes early to complete paperwork. Thank you! Contact information: [address] [place] [address] [phone]          Next level of care provider has access to [facility] Link:yes  Safety Planning and Suicide Prevention discussed: Yes,  with pt.  Have you used any form of tobacco in the last 30 days? (Cigarettes, Smokeless Tobacco, Cigars, and/or Pipes): No  Has patient been referred to the Quitline?: N/A patient is not a smoker  Patient has been referred for addiction treatment: N/A  Jonathon Jordan, MSW, LCSWA 02/21/2017, 12:03 PM

## 2017-02-21 NOTE — Progress Notes (Signed)
Recreation Therapy Notes  Date: 02/21/17 Time: 0930 Location: 300 Hall Dayroom  Group Topic: Stress Management  Goal Area(s) Addresses:  Patient will verbalize importance of using healthy stress management.  Patient will identify positive emotions associated with healthy stress management.   Intervention: Stress Managemen  Activity :  LRT introduced the stress management technique of guided imagery.  LRT read a script to allow patients to engage in the activity.  Patients were to follow along as LRT read script to participate in activity.  Education:  Stress Management, Discharge Planning.   Education Outcome: Acknowledges edcuation/In group clarification offered/Needs additional education  Clinical Observations/Feedback: Pt did not attend group.   Caroll Rancher, LRT/CTRS         Lillia Abed, Courtland Reas A 02/21/2017 2:42 PM

## 2017-02-21 NOTE — Discharge Summary (Signed)
Physician Discharge Summary Note  Patient:  Tara Brown is an 56 y.o., female MRN:  [id number] DOB:  Aug 01, 1961 Patient phone:  [phone] (home)   Patient address:   [address] [place] [address],   Total Time spent with patient: Greater than 30 minutes  Date of Admission:  02/17/2017  Date of Discharge: 02/21/2017  Reason for Admission: Worsening depression triggering suicidal ideations with plans.   Principal Problem: Major depressive disorder.  Discharge Diagnoses: Patient Active Problem List   Diagnosis Date Noted  . MDD (major depressive disorder) [F32.9] 02/17/2017  . Osteoporosis [M81.0] 05/29/2016  . HTN (hypertension) [I10] 05/29/2016  . MDD (major depressive disorder), recurrent severe, without psychosis (HCC) [F33.2] 05/28/2016  . Rheumatoid arthritis (HCC) [M06.9] 12/06/2015  . Anemia of chronic disease [D63.8] 03/08/2015   Past Medical History:  Past Medical History:  Diagnosis Date  . Anemia   . Arthritis   . 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 Osteoporosis     Past Surgical History:  Procedure Laterality Date  . AMPUTATION Left 12/07/2015   Procedure: AMPUTATION LEFT GREAT TOE;  Surgeon: Nadara Mustard, MD;  Location: [facility] ORS;  Service: Orthopedics;  Laterality: Left;  . FOOT SURGERY    . HAND SURGERY    . HIP FRACTURE SURGERY    . KNEE SURGERY    . NECK SURGERY     Family History:  Family History  Problem Relation Age of Onset  . Bipolar disorder Sister   . Suicidality Other   . Schizophrenia Other   . Stroke Neg Hx    Social History:  History  Alcohol Use No     History  Drug Use No    Social History   Social History  . Marital status: Divorced    Spouse name: N/A  . Number of children: N/A  . Years of education: N/A   Social History Main Topics  . Smoking status: Never Smoker  . Smokeless tobacco: Never Used  . Alcohol use No  . Drug use: No  .  Sexual activity: No   Other Topics Concern  . None   Social History Narrative  . None   Hospital Course: (Per admission notes):  56 yo Caucasian female, divorced, lives alone. Referred from [facility]. Self presented there unaccompanied. Had expressed worsening depression. Had expressed feelings of worthlessness and hopelessness. Had expressed thoughts of driving her car to the highway and walk in front of a truck. Her main stressor is chronic pain in her right shoulder. She had been on opoid medication without any benefit. Later expressed hope of getting surgery for her shoulder during this hospitalization. Routine labs was significant for anaemia of chronic illness. No substance use. Patient reports long history of pain. She has had multiple joint replaced over the years. Says some of them needs review. Patient says she has care staff come in to help he with ADL's. Says her pain has been getting worse lately. She has been down in the dumps. Says she is hopeful and wants to get better. Says her family has suggested ALF placement but she does not want to give up her freedom yet. Says she enjoys to take care of her cat. Her neighbor is looking after her cat now.  Tara Brown was admitted to the [facility] adult unit with complaints of worsening symptoms of depression triggering suicidal ideations with plans. She cited chronic pain that does not respond to pain medication  as the trigger. She was in need of mood stabilization treatments. During the course of her treatment, Tara Brown was medicated & discharged on, Sertraline 150 mg for depression, Hydroxyzine 25 mg prn for anxiety & Trazodone 50 mg insomnia. She was enrolled & participated in the group counseling sessions being offered & held on this unit. She was counseled & learned coping skills that should help her cope better & maintain mood stability after discharge. She was resumed on all her pertinent home medications for the other previously existing medical  issues presented. She tolerated her treatment regimen without any adverse effects reported.   While her treatment was on going, Tara Brown's improvement was monitored by observation & her daily reports of symptom reduction noted.  Her emotional & mental status were monitored by daily self-inventory reports completed by her & the clinical staff. She was evaluated daily by the treatment team for mood stability & the need for continued recovery after discharge. She was offered further treatment options upon discharge & will follow up with the outpatient psychiatric services as listed below.     Upon discharge, Tara Brown was both mentally & medically stable. She is currently denying suicidal, homicidal ideation, auditory, visual/tactile hallucinations, delusional thoughts & or paranoia. Tara Brown left [facility] with all personal belongings in no apparent distress. Transportation per family.  Physical Findings: AIMS: Facial and Oral Movements Muscles of Facial Expression: None, normal Lips and Perioral Area: None, normal Jaw: None, normal Tongue: None, normal,Extremity Movements Upper (arms, wrists, hands, fingers): None, normal Lower (legs, knees, ankles, toes): None, normal, Trunk Movements Neck, shoulders, hips: None, normal, Overall Severity Severity of abnormal movements (highest score from questions above): None, normal Incapacitation due to abnormal movements: None, normal Patient's awareness of abnormal movements (rate only patient's report): No Awareness, Dental Status Current problems with teeth and/or dentures?: No Does patient usually wear dentures?: No  CIWA:    COWS:     Musculoskeletal: Strength & Muscle Tone: abnormal Gait & Station: unsteady Patient leans: N/A  Psychiatric Specialty Exam: Physical Exam  Nursing note and vitals reviewed. Constitutional: She appears well-developed.  HENT:  Head: Normocephalic.  Eyes: Pupils are equal, round, and reactive to light.  Neck: Normal  range of motion.  Cardiovascular: Normal rate.   Respiratory: Effort normal.  GI: Soft.  Genitourinary:  Genitourinary Comments: Deferred  Musculoskeletal: Normal range of motion.  Neurological: She is alert.  Skin: Skin is warm.    Review of Systems  Constitutional: Negative.   HENT: Negative.   Eyes: Negative.   Respiratory: Negative.   Cardiovascular: Negative.   Gastrointestinal: Negative.   Genitourinary: Negative.   Musculoskeletal: Negative.   Skin: Negative.   Neurological: Negative.   Endo/Heme/Allergies: Negative.   Psychiatric/Behavioral: Positive for depression (Stable), hallucinations, memory loss and substance abuse. Negative for suicidal ideas. The patient is nervous/anxious and has insomnia (Stable).   All other systems reviewed and are negative.   Blood pressure 120/82, pulse 78, temperature 98.6 F (37 C), temperature source Oral, resp. rate 16, height 5\' 4"  (1.626 m), weight 46.7 kg (103 lb).Body mass index is 17.68 kg/m.  See Md's SRA.   Have you used any form of tobacco in the last 30 days? (Cigarettes, Smokeless Tobacco, Cigars, and/or Pipes): No  Has this patient used any form of tobacco in the last 30 days? (Cigarettes, Smokeless Tobacco, Cigars, and/or Pipes): No  Blood Alcohol level:  Lab Results  Component Value Date   ETH <5 02/17/2017   ETH <5 05/25/2016   Metabolic  Disorder Labs:  No results found for: HGBA1C, MPG No results found for: PROLACTIN No results found for: CHOL, TRIG, HDL, CHOLHDL, VLDL, LDLCALC  See Psychiatric Specialty Exam and Suicide Risk Assessment completed by Attending Physician prior to discharge.  Discharge destination:  Home  Is patient on multiple antipsychotic therapies at discharge:  No   Has Patient had three or more failed trials of antipsychotic monotherapy by history:  No  Recommended Plan for Multiple Antipsychotic Therapies: NA  Allergies as of 02/21/2017   No Known Allergies     Medication List     STOP taking these medications   calcium-vitamin D 500-200 MG-UNIT tablet Commonly known as:  OSCAL WITH D   ferrous sulfate 325 (65 FE) MG tablet   fluocinonide cream 0.05 % Commonly known as:  LIDEX   folic acid 1 MG tablet Commonly known as:  FOLVITE   lisinopril 40 MG tablet Commonly known as:  PRINIVIL,ZESTRIL   oxyCODONE-acetaminophen 7.5-325 MG tablet Commonly known as:  PERCOCET Replaced by:  oxyCODONE-acetaminophen 5-325 MG tablet   XELJANZ XR 11 MG Tb24 Generic drug:  Tofacitinib Citrate   zolpidem 5 MG tablet Commonly known as:  AMBIEN     TAKE these medications     Indication  hydrOXYzine 25 MG tablet Commonly known as:  ATARAX/VISTARIL Take 1 tablet (25 mg total) by mouth 3 (three) times daily as needed for anxiety.  Indication:  Anxiety Neurosis   oxyCODONE-acetaminophen 5-325 MG tablet Commonly known as:  PERCOCET/ROXICET Take 1 tablet by mouth every 8 (eight) hours as needed for severe pain. Replaces:  oxyCODONE-acetaminophen 7.5-325 MG tablet  Indication:  Chronic pain   predniSONE 10 MG tablet Commonly known as:  DELTASONE Take 1 tablet (10 mg total) by mouth daily with breakfast. For inflammation What changed:  additional instructions  Indication:  Rheumatoid Arthritis, Inflammation   sertraline 50 MG tablet Commonly known as:  ZOLOFT Take 3 tablets (150 mg total) by mouth daily. For depression What changed:  medication strength  how much to take  additional instructions  Indication:  Major Depressive Disorder   traZODone 50 MG tablet Commonly known as:  DESYREL Take 1 tablet (50 mg) at bedtime: For sleep  Indication:  Trouble Sleeping      Follow-up Information    [facility] Follow up on 02/27/2017.   Specialty:  Behavioral Health Why:  Assessment appointment at 8:45am for intensive outpatient. Please arrive 15 minutes early to complete paperwork. Thank you! Contact information: [address] [place] [address] [phone]         Follow-up recommendations: Activity:  As tolerated Diet: As recommended by your primary care doctor. Keep all scheduled follow-up appointments as recommended.  Comments: Patient is instructed prior to discharge to: Take all medications as prescribed by his/her mental healthcare provider. Report any adverse effects and or reactions from the medicines to his/her outpatient provider promptly. Patient has been instructed & cautioned: To not engage in alcohol and or illegal drug use while on prescription medicines. In the event of worsening symptoms, patient is instructed to call the crisis hotline, 911 and or go to the nearest ED for appropriate evaluation and treatment of symptoms. To follow-up with his/her primary care provider for your other medical issues, concerns and or health care needs.   Signed: Sanjuana Kava, NP, PMHNP, FNP-BC. 02/22/2017, 4:47 PM Patient seen, Suicide Assessment Completed.  Disposition Plan Reviewed

## 2017-02-21 NOTE — Progress Notes (Signed)
Nutrition Note  RD consulted for education regarding High-Calorie, High-Protein nutrition therapy.  RD provided "High-Calorie High-Protein Nutrition Therapy" handout from the Academy of Nutrition and Dietetics. Reviewed patient's dietary recall. Provided examples on ways to increase caloric density of foods and beverages frequently consumed by the patient. Also provided ideas to promote variety and to incorporate additional nutrient dense foods into patient's diet. Discussed eating small frequent meals and snacks to assist in increasing overall po intake. Teach back method used.  Provided coupons for Ensure and Boost to patient.  Expect fair compliance. Pt very groggy during visit. Pt states "I never thought I would be trying to increase my calories". RD explained that the patient is underweight and how this can impact her health.  Body mass index is 17.68 kg/m. Pt meets criteria for underweight based on current BMI.  Labs and medications reviewed. No further nutrition interventions warranted at this time. If additional nutrition issues arise, please re-consult RD.   Tara Franco, MS, RD, LDN Pager: [phone] After Hours Pager: [phone]

## 2017-02-21 NOTE — [facility] Suicide Risk Assessment (Signed)
[facility] INPATIENT:  Family/Significant Other Suicide Prevention Education  Suicide Prevention Education:  Patient Refusal for Family/Significant Other Suicide Prevention Education: The patient Tara Brown has refused to provide written consent for family/significant other to be provided Family/Significant Other Suicide Prevention Education during admission and/or prior to discharge.  Physician notified.  Jonathon Jordan, MSW, LCSWA 02/21/2017, 12:02 PM

## 2017-02-21 NOTE — [facility] Suicide Risk Assessment (Signed)
[facility] Discharge Suicide Risk Assessment   Principal Problem:  MDD  Discharge Diagnoses:  Patient Active Problem List   Diagnosis Date Noted  . MDD (major depressive disorder) [F32.9] 02/17/2017  . Osteoporosis [M81.0] 05/29/2016  . HTN (hypertension) [I10] 05/29/2016  . MDD (major depressive disorder), recurrent severe, without psychosis (HCC) [F33.2] 05/28/2016  . Rheumatoid arthritis (HCC) [M06.9] 12/06/2015  . Anemia of chronic disease [D63.8] 03/08/2015    Total Time spent with patient: 30 minutes  Musculoskeletal: Strength & Muscle Tone: affected by RA Gait & Station: improving  Patient leans: N/A  Psychiatric Specialty Exam: ROS chronic pain and difficulty with ADLs due to severe RA. Denies nausea or vomiting   Blood pressure 120/82, pulse 78, temperature 98.6 F (37 C), temperature source Oral, resp. rate 16, height 5\' 4"  (1.626 m), weight 46.7 kg (103 lb).Body mass index is 17.68 kg/m.  General Appearance: Well Groomed  Eye Contact::  Good  Speech:  Normal Rate409  Volume:  Normal  Mood:  improving mood, feels better   Affect:  Appropriate and more reactive   Thought Process:  Linear and Descriptions of Associations: Intact  Orientation:  Full (Time, Place, and Person)  Thought Content:  denies hallucinations,no delusions  Suicidal Thoughts:  No denies any suicidal or self injurious ideations, denies any homicidal or violent ideations  Homicidal Thoughts:  No  Memory:  recent and remote grossly intact  Judgement:  Other:  improving   Insight:  improving   Psychomotor Activity:  Normal  Concentration:  Good  Recall:  Good  Fund of Knowledge:Good  Language: Good  Akathisia:  Negative  Handed:  Right  AIMS (if indicated):     Assets:  Communication Skills Desire for Improvement Resilience  Sleep:  Number of Hours: 6  Cognition: WNL  ADL's:  Intact   Mental Status Per Nursing Assessment::   On Admission:  Suicidal ideation indicated by patient, Suicide plan,  Self-harm thoughts  Demographic Factors:  56 year old female,no children, lives alone but at this time has home health aide services several times a week, on disability  Loss Factors: Severe medical illness ( Arthritis ) , chronic pain   Historical Factors: Prior psychiatric admissions, history of depression, no history of suicide attempts   Risk Reduction Factors:   Positive coping skills or problem solving skills  Continued Clinical Symptoms:  At this time patient improved compared to admission - presents alert, attentive, well related, mood improved, affect fuller in range, no thought disorder, no suicidal or self injurious ideations, no homicidal ideations , future oriented . Behavior on unit in good control, pleasant on approach. Denies medication side effects.   Cognitive Features That Contribute To Risk:  No gross cognitive deficits noted upon discharge. Is alert , attentive, and oriented x 3   Suicide Risk:  Mild:  Suicidal ideation of limited frequency, intensity, duration, and specificity.  There are no identifiable plans, no associated intent, mild dysphoria and related symptoms, good self-control (both objective and subjective assessment), few other risk factors, and identifiable protective factors, including available and accessible social support.  Follow-up Information    [facility] Follow up on 02/27/2017.   Specialty:  Behavioral Health Why:  Assessment appointment at 8:45am for intensive outpatient. Please arrive 15 minutes early to complete paperwork. Thank you! Contact information: [address] [place] [phone]          Plan Of Care/Follow-up recommendations:  Activity:  as tolerated  Diet:  Regular Tests:  NA Other:  See below  Patient is requesting discharge, and is planning to return home- has home health services in place  Patient plans to follow up with her  Rheumatologist , Dr. Sharmon Revere, for medical management, has appt on 6/ 22 Follow up as above   Craige Cotta, MD 02/21/2017, 12:11 PM

## 2017-02-21 NOTE — Progress Notes (Signed)
Adult Psychoeducational Group Note  Date:  02/21/2017 Time:  5:29 AM  Group Topic/Focus:  Wrap-Up Group:   The focus of this group is to help patients review their daily goal of treatment and discuss progress on daily workbooks.  Participation Level:  Active  Participation Quality:  Appropriate, Attentive, Sharing and Supportive  Affect:  Appropriate  Cognitive:  Appropriate  Insight: Appropriate and Good  Engagement in Group:  Developing/Improving  Modes of Intervention:  Discussion, Socialization and Support  Additional Comments:  Pt shared in group she is preparing for discharge tomorrow, stated everything is in place for her discharge. Stated she is still going to do IOP for after care plans.  [name] 02/21/2017, 5:29 AM

## 2017-02-21 NOTE — Tx Team (Signed)
Interdisciplinary Treatment and Diagnostic Plan Update  02/21/2017 Time of Session: 9:00 AM Tara Brown MRN: [id number]  Principal Diagnosis: Major Depressive Disorder  Secondary Diagnoses: Active Problems:   MDD (major depressive disorder)   Current Medications:  Current Facility-Administered Medications  Medication Dose Route Frequency Provider Last Rate Last Dose  . acetaminophen (TYLENOL) tablet 650 mg  650 mg Oral Q6H PRN Oneta Rack, NP   650 mg at 02/18/17 0036  . alum & mag hydroxide-simeth (MAALOX/MYLANTA) 200-200-20 MG/5ML suspension 30 mL  30 mL Oral Q4H PRN Oneta Rack, NP      . feeding supplement (ENSURE ENLIVE) (ENSURE ENLIVE) liquid 237 mL  237 mL Oral BID BM Cobos, Rockey Situ, MD   237 mL at 02/21/17 0839  . hydrOXYzine (ATARAX/VISTARIL) tablet 25 mg  25 mg Oral TID PRN Oneta Rack, NP   25 mg at 02/18/17 0036  . magnesium hydroxide (MILK OF MAGNESIA) suspension 30 mL  30 mL Oral Daily PRN Oneta Rack, NP      . oxyCODONE-acetaminophen (PERCOCET/ROXICET) 5-325 MG per tablet 1 tablet  1 tablet Oral Q8H PRN Cobos, Rockey Situ, MD   1 tablet at 02/21/17 0610  . predniSONE (DELTASONE) tablet 10 mg  10 mg Oral Q breakfast Cobos, Rockey Situ, MD   10 mg at 02/21/17 8938  . sertraline (ZOLOFT) tablet 150 mg  150 mg Oral Daily Cobos, Rockey Situ, MD   150 mg at 02/21/17 1017  . traZODone (DESYREL) tablet 25 mg  25 mg Oral QHS,MR X 1 Kerry Hough, [name]   25 mg at 02/21/17 0020   PTA Medications: Prescriptions Prior to Admission  Medication Sig Dispense Refill Last Dose  . calcium-vitamin D (OSCAL WITH D) 500-200 MG-UNIT tablet Take 1 tablet by mouth daily with breakfast.    Past Month at Unknown time  . ferrous sulfate 325 (65 FE) MG tablet Take 1 tablet (325 mg total) by mouth 2 (two) times daily with a meal. (Patient taking differently: Take 325 mg by mouth daily with breakfast. ) 30 tablet 0 Past Month at Unknown time  . fluocinonide cream (LIDEX) 0.05 %  Apply 1 application topically 2 (two) times daily as needed (for psorasis).   Past Month at Unknown time  . folic acid (FOLVITE) 1 MG tablet Take 1 mg by mouth daily.  0 Past Month at Unknown time  . lisinopril (PRINIVIL,ZESTRIL) 40 MG tablet Take 40 mg by mouth daily.   Past Month at Unknown time  . oxyCODONE-acetaminophen (PERCOCET) 7.5-325 MG tablet Take 1 tablet by mouth every 4 (four) hours as needed for severe pain.   Past Week at Unknown time  . sertraline (ZOLOFT) 100 MG tablet Take 100 mg by mouth daily.   Past Month at Unknown time  . zolpidem (AMBIEN) 5 MG tablet Take 10 mg by mouth at bedtime as needed for sleep.   Past Month at Unknown time  . [DISCONTINUED] predniSONE (DELTASONE) 10 MG tablet Take 1 tablet (10 mg total) by mouth daily with breakfast. 30 tablet 0 Past Month at Unknown time  . Tofacitinib Citrate (XELJANZ XR) 11 MG TB24 Take 11 mg by mouth daily.   Not Taking at Unknown time    Patient Stressors: Financial difficulties Health problems Other: Pain  Patient Strengths: Ability for insight Average or above average intelligence Capable of independent living Communication skills Motivation for treatment/growth Supportive family/friends  Treatment Modalities: Medication Management, Group therapy, Case management,  1 to 1 session with  clinician, Psychoeducation, Recreational therapy.   Physician Treatment Plan for Primary Diagnosis: Major Depressive Disorder Long Term Goal(s): Improvement in symptoms so as ready for discharge Improvement in symptoms so as ready for discharge   Short Term Goals: Ability to identify changes in lifestyle to reduce recurrence of condition will improve Ability to verbalize feelings will improve Ability to disclose and discuss suicidal ideas Ability to demonstrate self-control will improve Ability to identify and develop effective coping behaviors will improve Ability to maintain clinical measurements within normal limits will  improve Compliance with prescribed medications will improve Ability to identify triggers associated with substance abuse/mental health issues will improve Ability to identify changes in lifestyle to reduce recurrence of condition will improve Ability to verbalize feelings will improve Ability to disclose and discuss suicidal ideas Ability to demonstrate self-control will improve Ability to identify and develop effective coping behaviors will improve Ability to maintain clinical measurements within normal limits will improve Compliance with prescribed medications will improve Ability to identify triggers associated with substance abuse/mental health issues will improve  Medication Management: Evaluate patient's response, side effects, and tolerance of medication regimen.  Therapeutic Interventions: 1 to 1 sessions, Unit Group sessions and Medication administration.  Evaluation of Outcomes: Adequate for Discharge  Physician Treatment Plan for Secondary Diagnosis: Active Problems:   MDD (major depressive disorder)  Long Term Goal(s): Improvement in symptoms so as ready for discharge Improvement in symptoms so as ready for discharge   Short Term Goals: Ability to identify changes in lifestyle to reduce recurrence of condition will improve Ability to verbalize feelings will improve Ability to disclose and discuss suicidal ideas Ability to demonstrate self-control will improve Ability to identify and develop effective coping behaviors will improve Ability to maintain clinical measurements within normal limits will improve Compliance with prescribed medications will improve Ability to identify triggers associated with substance abuse/mental health issues will improve Ability to identify changes in lifestyle to reduce recurrence of condition will improve Ability to verbalize feelings will improve Ability to disclose and discuss suicidal ideas Ability to demonstrate self-control will  improve Ability to identify and develop effective coping behaviors will improve Ability to maintain clinical measurements within normal limits will improve Compliance with prescribed medications will improve Ability to identify triggers associated with substance abuse/mental health issues will improve     Medication Management: Evaluate patient's response, side effects, and tolerance of medication regimen.  Therapeutic Interventions: 1 to 1 sessions, Unit Group sessions and Medication administration.  Evaluation of Outcomes: Adequate for Discharge   RN Treatment Plan for Primary Diagnosis: Major Depressive Disorder Long Term Goal(s): Knowledge of disease and therapeutic regimen to maintain health will improve  Short Term Goals: Ability to participate in decision making will improve, Ability to verbalize feelings will improve, Ability to disclose and discuss suicidal ideas and Ability to identify and develop effective coping behaviors will improve  Medication Management: RN will administer medications as ordered by provider, will assess and evaluate patient's response and provide education to patient for prescribed medication. RN will report any adverse and/or side effects to prescribing provider.  Therapeutic Interventions: 1 on 1 counseling sessions, Psychoeducation, Medication administration, Evaluate responses to treatment, Monitor vital signs and CBGs as ordered, Perform/monitor CIWA, COWS, AIMS and Fall Risk screenings as ordered, Perform wound care treatments as ordered.  Evaluation of Outcomes: Adequate for Discharge   LCSW Treatment Plan for Primary Diagnosis: Major Depressive Disorder Long Term Goal(s): Safe transition to appropriate next level of care at discharge, Engage patient in therapeutic  group addressing interpersonal concerns.  Short Term Goals: Engage patient in aftercare planning with referrals and resources, Increase ability to appropriately verbalize feelings,  Identify triggers associated with mental health/substance abuse issues and Increase skills for wellness and recovery  Therapeutic Interventions: Assess for all discharge needs, 1 to 1 time with Social worker, Explore available resources and support systems, Assess for adequacy in community support network, Educate family and significant other(s) on suicide prevention, Complete Psychosocial Assessment, Interpersonal group therapy.  Evaluation of Outcomes: Adequate for Discharge   Progress in Treatment: Attending groups: Yes. Participating in groups: Yes. limited participation due to chronic pain issues Taking medication as prescribed: Yes. Toleration medication: Yes. Family/Significant other contact made: No, pt declined contact. Patient understands diagnosis: Yes, AEB pt's willingness to participate in treatment. Discussing patient identified problems/goals with staff: Yes.  Medical problems stabilized or resolved: Yes Denies suicidal/homicidal ideation: Yes Issues/concerns per patient self-inventory: No. Other: na  New problem(s) identified: Yes, Describe:  CSW has been asked to provide list of assisted living facilities, CSW to assess patient payor source for possible housing placement  New Short Term/Long Term Goal(s):    Discharge Plan or Barriers: patient states she has difficulty living alone and meeting her needs 02/21/17: Pt states that she does not want ALF placement because she would like to keep her independence for as long as possible. Pt will return home and follow up outpatient with [facility]  Reason for Continuation of Hospitalization:   Estimated Length of Stay:  0 days, pt will likely discharge today 6/13  Attendees: Patient: 02/21/2017 1:03 PM  Physician: Sallyanne Havers MD 02/21/2017 1:03 PM  Nursing: Armando Reichert RN; Christa, RN 02/21/2017 1:03 PM  RN Care Manager: 02/21/2017 1:03 PM  Social Worker: Donnelly Stager, LCSWA 02/21/2017 1:03 PM  Recreational Therapist:   02/21/2017 1:03 PM  Other:  02/21/2017 1:03 PM  Other:  02/21/2017 1:03 PM  Other: 02/21/2017 1:03 PM    Scribe for Treatment Team: Jonathon Jordan, LCSWA 02/21/2017 1:03 PM

## 2017-02-21 NOTE — Progress Notes (Signed)
Patient ID: Tara Brown, female   DOB: June 01, 1961, 56 y.o.   MRN: [id number]  DAR/Discharge Note- Pt. Denies SI/HI and A/V hallucinations. She reports feeling better and being able to rest better. She is seen in the milieu interacting with peers and attending groups. Her affect is blunted and mood is depressed however she does brighten somewhat on approach. Belongings returned to patient at time of discharge. Patient does report  Discharge instructions and medications were reviewed with patient. Patient verbalized understanding of both medications and discharge instructions. Patient discharged to [facility] transportation which is to transport patient to [facility] where patient reports he car is waiting. Q15 minute safety checks maintained until discharge. No distress upon discharge.

## 2017-02-21 NOTE — Progress Notes (Signed)
  [facility] Adult Case Management Discharge Plan :  Will you be returning to the same living situation after discharge:  Yes,  pt returning home. At discharge, do you have transportation home?: Yes,  pt has access to transportation. Do you have the ability to pay for your medications: Yes,  pt has insurance.  Release of information consent forms completed and in the chart;  Patient's signature needed at discharge.  Patient to Follow up at: Follow-up Information    [facility] Follow up on 02/27/2017.   Specialty:  Behavioral Health Why:  Assessment appointment at 8:45am for intensive outpatient. Please arrive 15 minutes early to complete paperwork. Thank you! Contact information: [address] [place] [address] [phone]          Next level of care provider has access to [facility] Link:yes  Safety Planning and Suicide Prevention discussed: Yes,  with pt.  Have you used any form of tobacco in the last 30 days? (Cigarettes, Smokeless Tobacco, Cigars, and/or Pipes): No  Has patient been referred to the Quitline?: N/A patient is not a smoker  Patient has been referred for addiction treatment: N/A    Jonathon Jordan, MSW, LCSWA  02/21/2017, 1:08 PM

## 2017-02-21 NOTE — Progress Notes (Signed)
Tara Brown had been up and visible in milieu this evening, did attend and participate in evening group activity. She spoke about feeling better, denied any SI and spoke about how she is to be discharged in the morning and was able to receive all bedtime medications without incident. A. Support and encouragement provided. R. Safety maintained, will continue to monitor.

## 2017-05-24 IMAGING — CT CT FOOT*L* W/CM
4 of 6 series · 15 of 33 positions shown, 16 images · IV contrast (omnipaque)
Comparison: None.

CLINICAL DATA: Left great toe open sores and erythema for 1 week.
Possible hardware rejection. Evaluate for osteomyelitis. Initial
encounter.

EXAM:
CT OF THE LEFT FOOT WITH CONTRAST
TECHNIQUE: Multidetector CT imaging was performed following the standard
protocol during bolus administration of intravenous contrast.
CONTRAST:  100mL OMNIPAQUE IOHEXOL 300 MG/ML  SOLN

[Series 4: lt foot w st · axial · 0.48mm/px · z∈[-1311,-1179]mm · 4 of 111 slices shown, 5 images]
[im 23/111  soft-tissue]
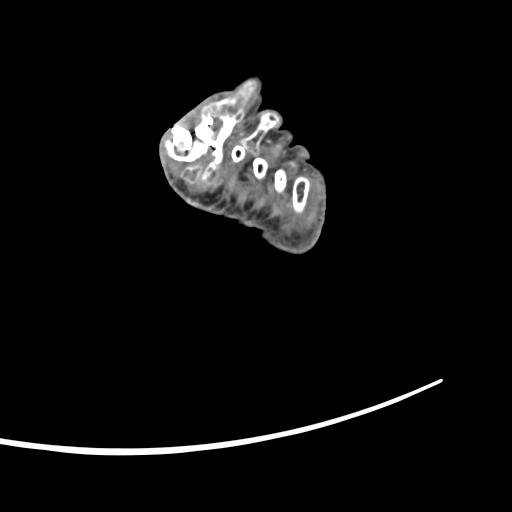
[im 23/111  bone]
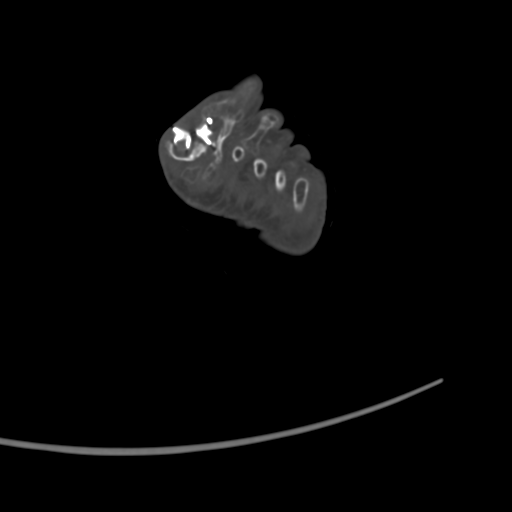
[im 45/111  bone]
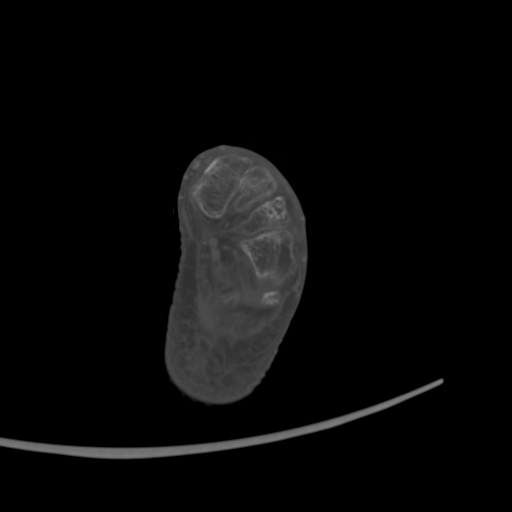
[im 67/111  bone]
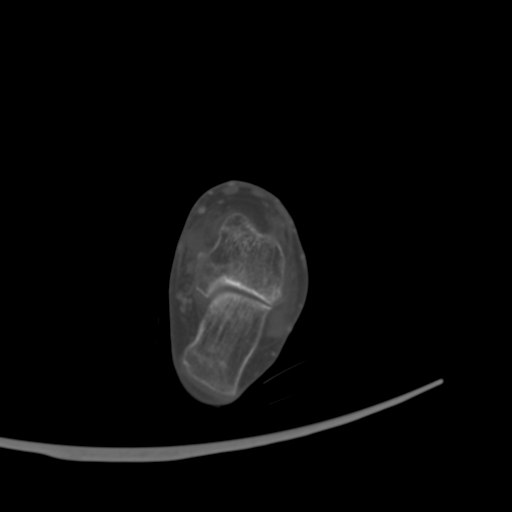
[im 89/111  bone]
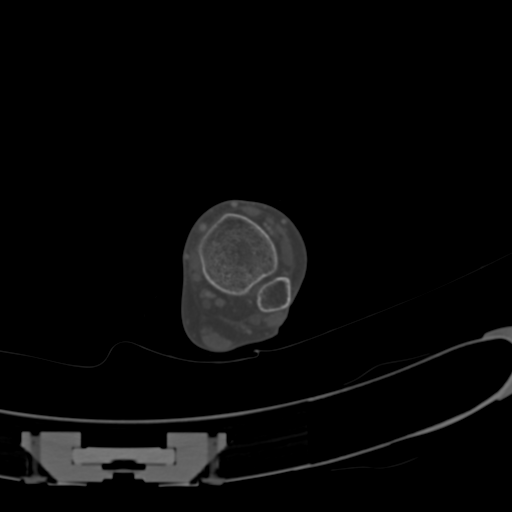

[Series 603: cor · coronal · 0.48mm/px · 3 of 61 slices shown]
[im 13/61  bone]
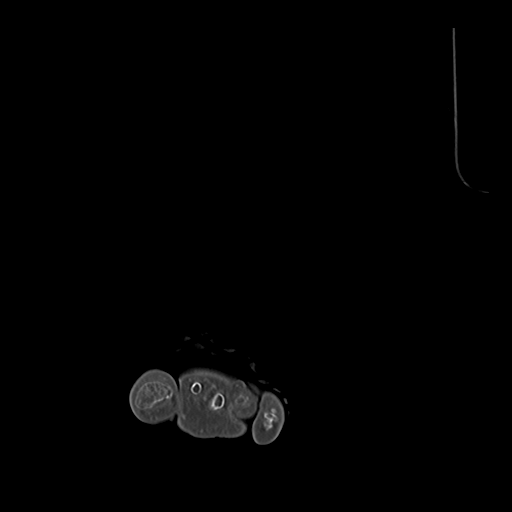
[im 25/61  bone]
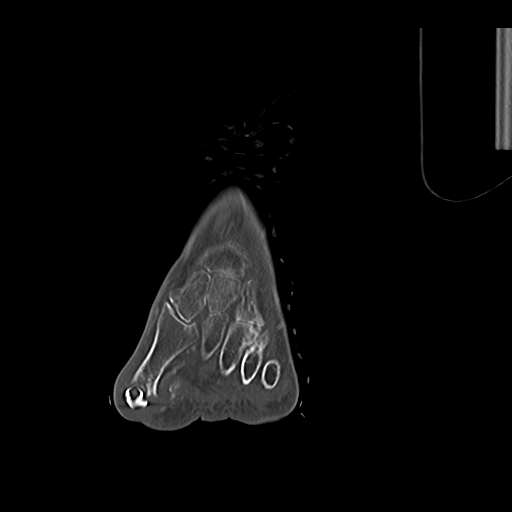
[im 37/61  bone]
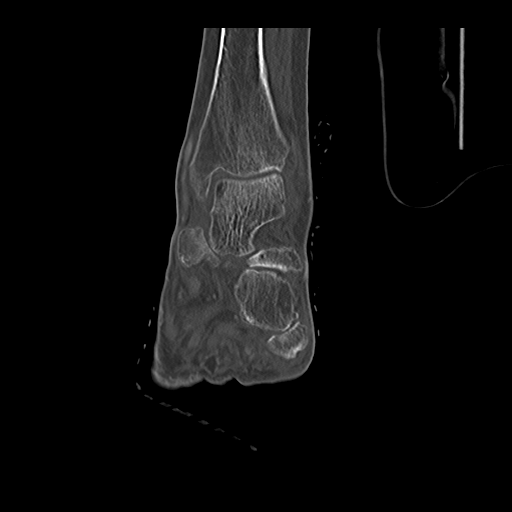

[Series 605: <mpr thick range(2)> · sagittal · 0.48mm/px · 5 of 60 slices shown]
[im 10/60  bone]
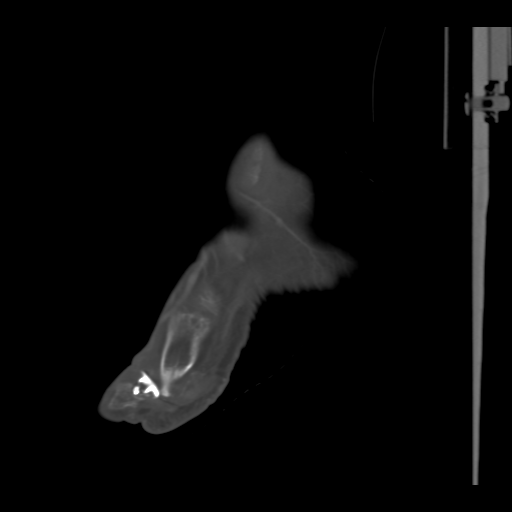
[im 20/60  bone]
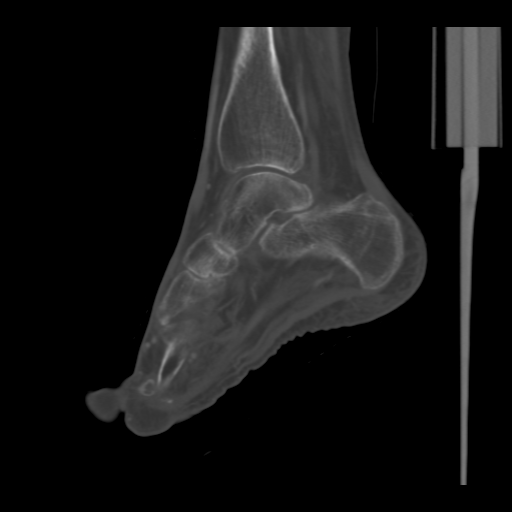
[im 30/60  bone]
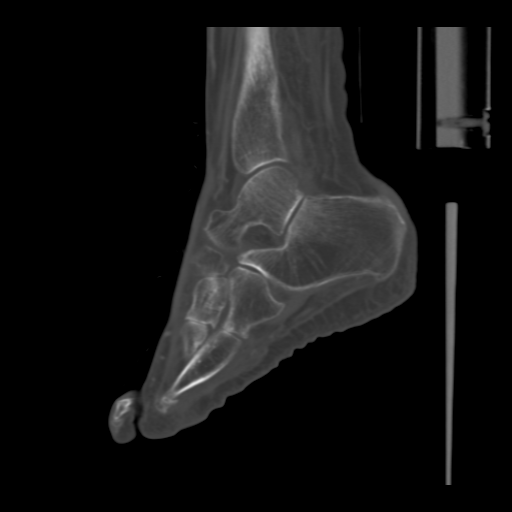
[im 40/60  bone]
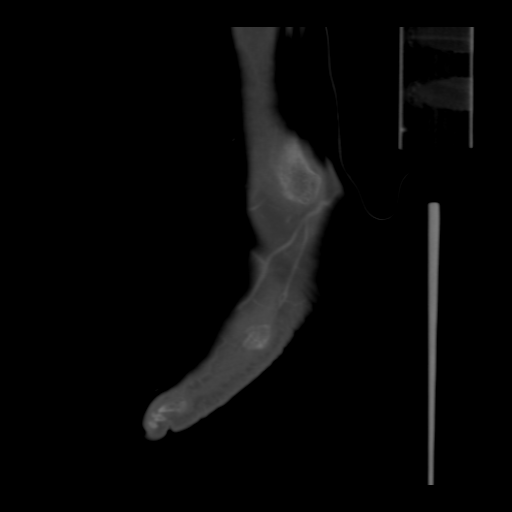
[im 50/60  bone]
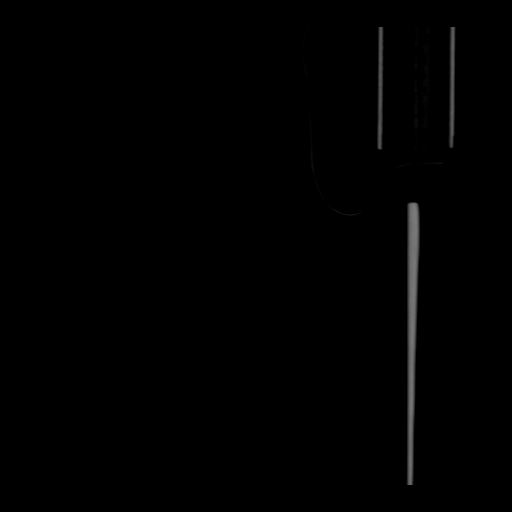

[Series 606: <mpr thick range(3)> · axial · 0.48mm/px · z∈[-1231,-1135]mm · 3 of 112 slices shown]
[im 28/112  bone]
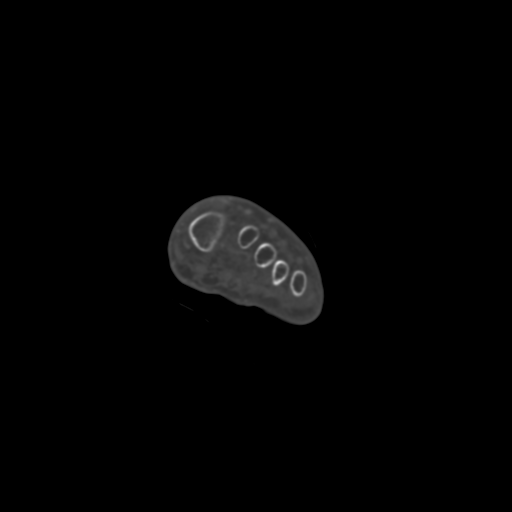
[im 56/112  bone]
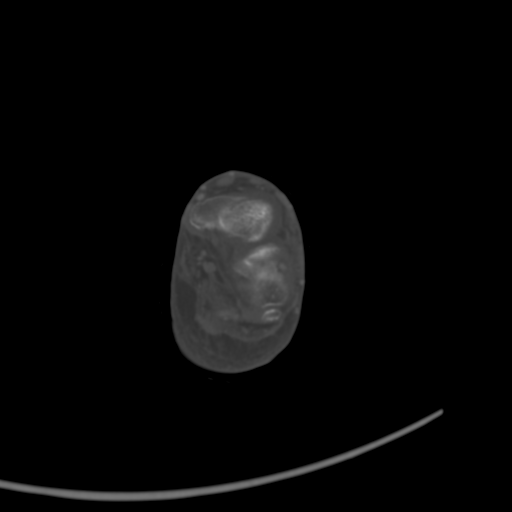
[im 84/112  bone]
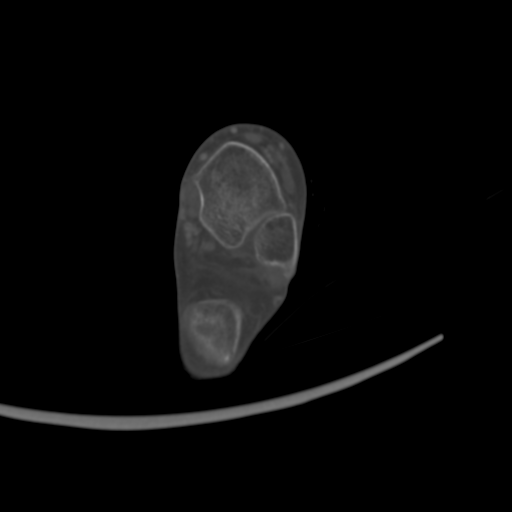

[15 of 33 positions shown; findings below may reference images not displayed]

FINDINGS: The entire foot and ankle were imaged. The bones are diffusely
demineralized. There are arthropathic change throughout the forefoot
status post total arthroplasty of the first metatarsal phalangeal
joint. There is some bone resorption surrounding the prosthetic
components within the first metatarsal head and proximal phalangeal
base. In addition, there is dorsal and lateral dislocation at the
first MTP joint. The interphalangeal joint of the great toe may be
partially ankylosed.

Advanced arthropathic changes are present at the additional
metatarsal phalangeal heads with flattening. There has been possible
previous resection or erosion of the second metatarsal head. There
is also dorsal lateral dislocation at the second MTP joint. Milder
arthropathic changes are present throughout the midfoot without
subluxation at the Lisfranc joint. The subtalar and tibiotalar
joints demonstrate no significant findings. There is possible
chronic posttraumatic deformity of the distal fibula.

There is generalized atrophy of the visualized foot and lower leg
musculature. The ankle tendons appear intact. There is nonspecific
soft tissue swelling in the great toe without evidence of focal
fluid collection, foreign body or soft tissue emphysema. There is an
effusion and probable synovial thickening at the first MTP joint.
IMPRESSION: 1. Nonspecific soft tissue thickening, fusion and probable synovitis
of the first MTP joint. No focal soft tissue abscess identified.
2. Diffuse arthropathic changes throughout the forefoot status post
first MTP total arthroplasty. The first and second MTP joints are
dislocated. There is bony resorption around the first MTP prosthetic
components which is nonspecific, although more likely due to
particle disease or the patient's underlying arthropathy than
superimposed osteomyelitis. Correlation with prior radiographs would
be helpful to assess the chronicity of these findings.

## 2018-01-03 DIAGNOSIS — L219 Seborrheic dermatitis, unspecified: Secondary | ICD-10-CM | POA: Insufficient documentation

## 2019-12-03 DIAGNOSIS — T84021A Dislocation of internal left hip prosthesis, initial encounter: Secondary | ICD-10-CM | POA: Insufficient documentation

## 2019-12-03 DIAGNOSIS — S73005A Unspecified dislocation of left hip, initial encounter: Secondary | ICD-10-CM | POA: Insufficient documentation

## 2020-01-08 DIAGNOSIS — R400 Somnolence: Secondary | ICD-10-CM | POA: Insufficient documentation

## 2020-02-04 ENCOUNTER — Emergency Department (HOSPITAL_COMMUNITY): Payer: Medicare HMO

## 2020-02-04 ENCOUNTER — Inpatient Hospital Stay (HOSPITAL_COMMUNITY)
Admission: EM | Admit: 2020-02-04 | Discharge: 2020-02-18 | DRG: 956 | Disposition: A | Discharge status: Home Health | Payer: Medicare HMO | Attending: Family Medicine | Admitting: Family Medicine

## 2020-02-04 ENCOUNTER — Other Ambulatory Visit: Payer: Self-pay

## 2020-02-04 ENCOUNTER — Encounter (HOSPITAL_COMMUNITY): Payer: Self-pay | Admitting: Emergency Medicine

## 2020-02-04 DIAGNOSIS — R0902 Hypoxemia: Secondary | ICD-10-CM

## 2020-02-04 DIAGNOSIS — S32302A Unspecified fracture of left ilium, initial encounter for closed fracture: Secondary | ICD-10-CM

## 2020-02-04 DIAGNOSIS — Z66 Do not resuscitate: Secondary | ICD-10-CM | POA: Diagnosis not present

## 2020-02-04 DIAGNOSIS — J9602 Acute respiratory failure with hypercapnia: Secondary | ICD-10-CM | POA: Diagnosis not present

## 2020-02-04 DIAGNOSIS — I959 Hypotension, unspecified: Secondary | ICD-10-CM | POA: Diagnosis present

## 2020-02-04 DIAGNOSIS — L8990 Pressure ulcer of unspecified site, unspecified stage: Secondary | ICD-10-CM | POA: Insufficient documentation

## 2020-02-04 DIAGNOSIS — Z79899 Other long term (current) drug therapy: Secondary | ICD-10-CM

## 2020-02-04 DIAGNOSIS — R64 Cachexia: Secondary | ICD-10-CM | POA: Diagnosis present

## 2020-02-04 DIAGNOSIS — R296 Repeated falls: Secondary | ICD-10-CM | POA: Diagnosis present

## 2020-02-04 DIAGNOSIS — I10 Essential (primary) hypertension: Secondary | ICD-10-CM | POA: Diagnosis present

## 2020-02-04 DIAGNOSIS — M810 Age-related osteoporosis without current pathological fracture: Secondary | ICD-10-CM | POA: Diagnosis present

## 2020-02-04 DIAGNOSIS — L4050 Arthropathic psoriasis, unspecified: Secondary | ICD-10-CM | POA: Diagnosis present

## 2020-02-04 DIAGNOSIS — Y92009 Unspecified place in unspecified non-institutional (private) residence as the place of occurrence of the external cause: Secondary | ICD-10-CM

## 2020-02-04 DIAGNOSIS — D519 Vitamin B12 deficiency anemia, unspecified: Secondary | ICD-10-CM | POA: Diagnosis present

## 2020-02-04 DIAGNOSIS — R011 Cardiac murmur, unspecified: Secondary | ICD-10-CM | POA: Diagnosis present

## 2020-02-04 DIAGNOSIS — S32402A Unspecified fracture of left acetabulum, initial encounter for closed fracture: Secondary | ICD-10-CM

## 2020-02-04 DIAGNOSIS — Z20822 Contact with and (suspected) exposure to covid-19: Secondary | ICD-10-CM | POA: Diagnosis present

## 2020-02-04 DIAGNOSIS — D509 Iron deficiency anemia, unspecified: Secondary | ICD-10-CM | POA: Diagnosis present

## 2020-02-04 DIAGNOSIS — G4700 Insomnia, unspecified: Secondary | ICD-10-CM | POA: Diagnosis present

## 2020-02-04 DIAGNOSIS — R5381 Other malaise: Secondary | ICD-10-CM | POA: Diagnosis present

## 2020-02-04 DIAGNOSIS — Z681 Body mass index (BMI) 19 or less, adult: Secondary | ICD-10-CM

## 2020-02-04 DIAGNOSIS — T380X5A Adverse effect of glucocorticoids and synthetic analogues, initial encounter: Secondary | ICD-10-CM | POA: Diagnosis not present

## 2020-02-04 DIAGNOSIS — Z8781 Personal history of (healed) traumatic fracture: Secondary | ICD-10-CM

## 2020-02-04 DIAGNOSIS — W1830XA Fall on same level, unspecified, initial encounter: Secondary | ICD-10-CM | POA: Diagnosis present

## 2020-02-04 DIAGNOSIS — L89151 Pressure ulcer of sacral region, stage 1: Secondary | ICD-10-CM | POA: Diagnosis present

## 2020-02-04 DIAGNOSIS — S72009A Fracture of unspecified part of neck of unspecified femur, initial encounter for closed fracture: Secondary | ICD-10-CM | POA: Diagnosis not present

## 2020-02-04 DIAGNOSIS — Z419 Encounter for procedure for purposes other than remedying health state, unspecified: Secondary | ICD-10-CM

## 2020-02-04 DIAGNOSIS — J189 Pneumonia, unspecified organism: Secondary | ICD-10-CM | POA: Diagnosis not present

## 2020-02-04 DIAGNOSIS — Z96642 Presence of left artificial hip joint: Secondary | ICD-10-CM | POA: Diagnosis present

## 2020-02-04 DIAGNOSIS — W19XXXA Unspecified fall, initial encounter: Secondary | ICD-10-CM

## 2020-02-04 DIAGNOSIS — S72001A Fracture of unspecified part of neck of right femur, initial encounter for closed fracture: Secondary | ICD-10-CM

## 2020-02-04 DIAGNOSIS — R339 Retention of urine, unspecified: Secondary | ICD-10-CM | POA: Diagnosis not present

## 2020-02-04 DIAGNOSIS — J9601 Acute respiratory failure with hypoxia: Secondary | ICD-10-CM

## 2020-02-04 DIAGNOSIS — S7221XA Displaced subtrochanteric fracture of right femur, initial encounter for closed fracture: Principal | ICD-10-CM | POA: Diagnosis present

## 2020-02-04 DIAGNOSIS — E43 Unspecified severe protein-calorie malnutrition: Secondary | ICD-10-CM | POA: Insufficient documentation

## 2020-02-04 DIAGNOSIS — M069 Rheumatoid arthritis, unspecified: Secondary | ICD-10-CM | POA: Diagnosis present

## 2020-02-04 DIAGNOSIS — M419 Scoliosis, unspecified: Secondary | ICD-10-CM | POA: Diagnosis present

## 2020-02-04 DIAGNOSIS — Z818 Family history of other mental and behavioral disorders: Secondary | ICD-10-CM

## 2020-02-04 DIAGNOSIS — F329 Major depressive disorder, single episode, unspecified: Secondary | ICD-10-CM | POA: Diagnosis present

## 2020-02-04 DIAGNOSIS — D62 Acute posthemorrhagic anemia: Secondary | ICD-10-CM | POA: Diagnosis not present

## 2020-02-04 DIAGNOSIS — Z7952 Long term (current) use of systemic steroids: Secondary | ICD-10-CM

## 2020-02-04 LAB — COMPREHENSIVE METABOLIC PANEL
ALT: 12 U/L (ref 0–44)
AST: 19 U/L (ref 15–41)
Albumin: 3.5 g/dL (ref 3.5–5.0)
Alkaline Phosphatase: 87 U/L (ref 38–126)
Anion gap: 13 (ref 5–15)
BUN: 19 mg/dL (ref 6–20)
CO2: 26 mmol/L (ref 22–32)
Calcium: 8.4 mg/dL — ABNORMAL LOW (ref 8.9–10.3)
Chloride: 94 mmol/L — ABNORMAL LOW (ref 98–111)
Creatinine, Ser: 0.78 mg/dL (ref 0.44–1.00)
GFR calc Af Amer: >60 mL/min (ref >60)
GFR calc non Af Amer: >60 mL/min (ref >60)
Glucose, Bld: 124 mg/dL — ABNORMAL HIGH (ref 70–99)
Potassium: 3.9 mmol/L (ref 3.5–5.1)
Sodium: 133 mmol/L — ABNORMAL LOW (ref 135–145)
Total Bilirubin: 0.3 mg/dL (ref 0.3–1.2)
Total Protein: 7.6 g/dL (ref 6.5–8.1)

## 2020-02-04 LAB — CBC WITH DIFFERENTIAL/PLATELET
Abs Immature Granulocytes: 0.03 10*3/uL (ref 0.00–0.07)
Basophils Absolute: 0 10*3/uL (ref 0.0–0.1)
Basophils Relative: 0 %
Eosinophils Absolute: 0 10*3/uL (ref 0.0–0.5)
Eosinophils Relative: 0 %
HCT: 27.4 % — ABNORMAL LOW (ref 36.0–46.0)
Hemoglobin: 8.4 g/dL — ABNORMAL LOW (ref 12.0–15.0)
Immature Granulocytes: 0 %
Lymphocytes Relative: 12 %
Lymphs Abs: 1.2 10*3/uL (ref 0.7–4.0)
MCH: 27.1 pg (ref 26.0–34.0)
MCHC: 30.7 g/dL (ref 30.0–36.0)
MCV: 88.4 fL (ref 80.0–100.0)
Monocytes Absolute: 0.9 10*3/uL (ref 0.1–1.0)
Monocytes Relative: 9 %
Neutro Abs: 8 10*3/uL — ABNORMAL HIGH (ref 1.7–7.7)
Neutrophils Relative %: 79 %
Platelets: 370 10*3/uL (ref 150–400)
RBC: 3.1 MIL/uL — ABNORMAL LOW (ref 3.87–5.11)
RDW: 18.6 % — ABNORMAL HIGH (ref 11.5–15.5)
WBC: 10.3 10*3/uL (ref 4.0–10.5)
nRBC: 0 % (ref 0.0–0.2)

## 2020-02-04 LAB — CK: Total CK: 410 U/L — ABNORMAL HIGH (ref 38–234)

## 2020-02-04 MED ORDER — MORPHINE SULFATE (PF) 4 MG/ML IV SOLN
4.0000 mg | Freq: Once | INTRAVENOUS | Status: AC
Start: 2020-02-04 — End: 2020-02-04
  Administered 2020-02-04: 4 mg via INTRAVENOUS
  Filled 2020-02-04: qty 1

## 2020-02-04 MED ORDER — IOHEXOL 300 MG/ML  SOLN
100.0000 mL | Freq: Once | INTRAMUSCULAR | Status: AC | PRN
  Administered 2020-02-04: 80 mL via INTRAVENOUS

## 2020-02-04 MED ORDER — MORPHINE SULFATE (PF) 4 MG/ML IV SOLN
4.0000 mg | Freq: Once | INTRAVENOUS | Status: AC
  Administered 2020-02-04: 4 mg via INTRAVENOUS
  Filled 2020-02-04: qty 1

## 2020-02-04 MED ORDER — FENTANYL CITRATE (PF) 100 MCG/2ML IJ SOLN
50.0000 ug | Freq: Once | INTRAMUSCULAR | Status: AC
  Administered 2020-02-04: 50 ug via INTRAVENOUS
  Filled 2020-02-04: qty 2

## 2020-02-04 NOTE — H&P (Signed)
History and Physical    Tara Brown ZHG:[id number] DOB: 1961/01/22 DOA: 02/04/2020  PCP: [name], [name]  Patient coming from: Home  I have personally briefly reviewed patient's old medical records in [facility]  Chief Complaint:  Fall at home   HPI: Tara Brown is a 58 y.o. female with medical history significant of  rheumatoid arthritis with extensive scoliosis, major depressive disorder, hypertension, osteopenia, insomnia severe malnutrition presents to the hospital s/p fall, found down at home by health aide was on floor for over 12 hours.  Patient states she has a mechanical fall. She notes no loc or head trauma , but s/p fall unable to get up and note b/l hip pain and back pain. On ros she notes no sob, chest pain, n/v/d/dysuria, fever /chills, cough, noted wt is stable, no change in appetite.    ED Course:  BP 118/81 (BP Location: Left Arm)   Pulse 81   Temp 99 F (37.2 C) (Oral)   Resp 18    Labs:  Cr 0.85, wbc 7., hgb8.4 stable from prior Labs: na 133,  K3.9, ck 410  Cxr: Limited by positioning and technique. Subsegmental atelectasis or scar at the left base. Hip IMPRESSION: 1. Prior left hip replacement without dislocation. Acute mildly displaced fracture within the left iliac bone extending from the crest to the superolateral acetabular margin. 2. Acute mildly comminuted, displaced and angulated fracture involving the proximal right femur, involves the trochanter and subtrochanteric femur.  Review of Systems: As per HPI otherwise 10 point review of systems negative.   Past Medical History:  Diagnosis Date  . Anemia   . Arthritis   . 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 Osteoporosis     Past Surgical History:  Procedure Laterality Date  . AMPUTATION Left 12/07/2015   Procedure: AMPUTATION LEFT GREAT TOE;  Surgeon: Nadara Mustard, MD;  Location: [facility];  Service: Orthopedics;   Laterality: Left;  . FOOT SURGERY    . HAND SURGERY    . HIP FRACTURE SURGERY    . KNEE SURGERY    . NECK SURGERY       reports that she has never smoked. She has never used smokeless tobacco. She reports that she does not drink alcohol or use drugs.  No Known Allergies  Family History  Problem Relation Age of Onset  . Bipolar disorder Sister   . Suicidality Other   . Schizophrenia Other   . Stroke Neg Hx    Prior to Admission medications   Medication Sig Start Date End Date Taking? Authorizing Provider  alendronate (FOSAMAX) 70 MG tablet Take 70 mg by mouth once a week. fridays 08/19/18  Yes [provider]  aspirin 81 MG chewable tablet Chew 81 mg by mouth daily.   Yes [provider]  buPROPion (WELLBUTRIN XL) 150 MG 24 hr tablet Take 150 mg by mouth daily.  11/11/19 11/10/20 Yes [provider]  escitalopram (LEXAPRO) 20 MG tablet Take 20 mg by mouth daily. 08/16/19  Yes [provider]  lisinopril (ZESTRIL) 40 MG tablet Take 40 mg by mouth daily.  11/26/14  Yes [provider]  methotrexate (RHEUMATREX) 2.5 MG tablet Take 12.5 mg by mouth once a week. Caution:Chemotherapy. Protect from light. Wednesdays   Yes [provider]  predniSONE (DELTASONE) 5 MG tablet Take 5 mg by mouth daily. 11/26/19  Yes [provider]  zolpidem (AMBIEN) 10 MG tablet Take 10 mg by mouth  at bedtime as needed. 02/03/20  Yes [provider]  hydrOXYzine (ATARAX/VISTARIL) 25 MG tablet Take 1 tablet (25 mg total) by mouth 3 (three) times daily as needed for anxiety. Patient not taking: Reported on 02/04/2020 02/21/17   Armandina Stammer I, NP  oxyCODONE-acetaminophen (PERCOCET/ROXICET) 5-325 MG tablet Take 1 tablet by mouth every 8 (eight) hours as needed for severe pain. Patient not taking: Reported on 02/04/2020 02/21/17   Armandina Stammer I, NP  sertraline (ZOLOFT) 50 MG tablet Take 3 tablets (150 mg total) by mouth daily. For depression Patient not  taking: Reported on 02/04/2020 02/22/17   Armandina Stammer I, NP  traZODone (DESYREL) 50 MG tablet Take 1 tablet (50 mg) at bedtime: For sleep Patient not taking: Reported on 02/04/2020 02/21/17   Armandina Stammer I, NP    Physical Exam: Vitals:   02/04/20 2145 02/04/20 2200 02/04/20 2215 02/04/20 2315  BP:      Pulse:   84 80  Resp: 20 (!) 22 20 19   Temp:      TempSrc:      SpO2:   95% 91%  Weight:      Height:        Constitutional: NAD, calm, comfortable Vitals:   02/04/20 2145 02/04/20 2200 02/04/20 2215 02/04/20 2315  BP:      Pulse:   84 80  Resp: 20 (!) 22 20 19   Temp:      TempSrc:      SpO2:   95% 91%  Weight:      Height:       Eyes: PERRL, lids and conjunctivae normal ENMT: Mucous membranes are very dry . Posterior pharynx clear of any exudate or lesions.poor dentition.  Neck: normal, supple, no masses, no thyromegaly Respiratory: clear to auscultation bilaterally, no wheezing, no crackles. Normal respiratory effort. No accessory muscle use.  Cardiovascular: Regular rate and rhythm, no murmurs / rubs / gallops. No extremity edema. 2+ pedal pulses. No carotid bruits.  Abdomen: no tenderness, no masses palpated. No hepatosplenomegaly. Bowel sounds positive.  Musculoskeletal: no clubbing / cyanosis. + joint deformity upper and lower extremities.+ contractures. Decrease tone, pain on palpation of ASIS on right and groin  Skin: no rashes, lesions, ulcers. No induration Neurologic: CN 2-12 grossly intact. Sensation intact, . Strength 3+/5 in all lower 4 in upper .  Psychiatric: Normal judgment and insight. Alert and oriented x 3. Normal mood.    Labs on Admission: I have personally reviewed following labs and imaging studies  CBC: Recent Labs  Lab 02/04/20 1601  WBC 10.3  NEUTROABS 8.0*  HGB 8.4*  HCT 27.4*  MCV 88.4  PLT 370   Basic Metabolic Panel: Recent Labs  Lab 02/04/20 1601  NA 133*  K 3.9  CL 94*  CO2 26  GLUCOSE 124*  BUN 19  CREATININE 0.78   CALCIUM 8.4*   GFR: Estimated Creatinine Clearance: 55.5 mL/min (by C-G formula based on SCr of 0.78 mg/dL). Liver Function Tests: Recent Labs  Lab 02/04/20 1601  AST 19  ALT 12  ALKPHOS 87  BILITOT 0.3  PROT 7.6  ALBUMIN 3.5   No results for input(s): LIPASE, AMYLASE in the last 168 hours. No results for input(s): AMMONIA in the last 168 hours. Coagulation Profile: No results for input(s): INR, PROTIME in the last 168 hours. Cardiac Enzymes: Recent Labs  Lab 02/04/20 1601  CKTOTAL 410*   BNP (last 3 results) No results for input(s): PROBNP in the last 8760 hours. HbA1C: No results  for input(s): HGBA1C in the last 72 hours. CBG: No results for input(s): GLUCAP in the last 168 hours. Lipid Profile: No results for input(s): CHOL, HDL, LDLCALC, TRIG, CHOLHDL, LDLDIRECT in the last 72 hours. Thyroid Function Tests: No results for input(s): TSH, T4TOTAL, FREET4, T3FREE, THYROIDAB in the last 72 hours. Anemia Panel: No results for input(s): VITAMINB12, FOLATE, FERRITIN, TIBC, IRON, RETICCTPCT in the last 72 hours. Urine analysis:    Component Value Date/Time   COLORURINE AMBER (A) 08/07/2015 1020   APPEARANCEUR CLOUDY (A) 08/07/2015 1020   LABSPEC 1.021 08/07/2015 1020   PHURINE 6.0 08/07/2015 1020   GLUCOSEU NEGATIVE 08/07/2015 1020   HGBUR LARGE (A) 08/07/2015 1020   BILIRUBINUR MODERATE (A) 08/07/2015 1020   KETONESUR 15 (A) 08/07/2015 1020   PROTEINUR 100 (A) 08/07/2015 1020   UROBILINOGEN 2.0 (H) 03/07/2015 1832   NITRITE NEGATIVE 08/07/2015 1020   LEUKOCYTESUR LARGE (A) 08/07/2015 1020    Radiological Exams on Admission: DG Chest 2 View  Result Date: 02/04/2020 CLINICAL DATA:  Fall EXAM: CHEST - 2 VIEW COMPARISON:  01/08/2020 FINDINGS: Examination is limited by severe scoliosis. Lateral view limited by patient's arms and positioning. No gross pleural effusion. Linear atelectasis at the left base. Stable cardiomediastinal silhouette. Old appearing bilateral  rib fractures. No gross pneumothorax allowing for positioning and arc technique. Chronic deformities of the proximal humeri. IMPRESSION: Limited by positioning and technique. Subsegmental atelectasis or scar at the left base. Electronically Signed   By: Donavan Foil M.D.   On: 02/04/2020 18:15    EKG: Independently reviewed. Nsr, minimal st depression chronic unchanged from prior ekg  Assessment/Plan  B/L hip fx s/p fall  - of note patient has complicated history of recent repair of fracture on the left  At [facility]  Which notes fx in vicinity of recently place hardware.  -call was made to [facility] ortho but they deferred to ortho care at this institution  - DR Mardelle Matte will follow patient and plans for OR In am for repair of right femur -supportive care /pain control per protocol   Anemia  -chronic iron deft -per patient not able to tolerate iron  -monitor labs currently stable   rheumatoid arthritis/extensive scoliosis -chronic debility , multiple fall , fx -cont mtx, prednisone    major depressive disorder -resume home medications    hypertension -relative hypotension  -hold bp meds resume as bp tolerates    osteopenia -continue home regimen    insomnia Cont home meds   severe malnutrition -will need nutrition consult in patient    DVT prophylaxis: heparin  Code Status: DNR Family Communicationn/a Disposition Plan:  4-7 days  Consults called: ortho Admission status: inpatient Clance Boll MD [facility]  If 7PM-7AM, please contact night-coverage [email or web address] Password [facility]  02/04/2020, 11:52 PM

## 2020-02-04 NOTE — ED Triage Notes (Addendum)
Arrives via EMS from home, has severe scoliosis and walks with assistive devices, fell in her kitchen around 8:30 pm last night, got off the floor around 2pm today by a home health care provider. C/C fall, R hip and pelvic pain. Tender to palpation. R leg has some inward rotation, but she states this is normal. 200 mcg intra-nasal by EMS.

## 2020-02-04 NOTE — ED Provider Notes (Signed)
[facility] DEPT Provider Note   CSN: [id number] Arrival date & time: 02/04/20  1514     History Chief Complaint  Patient presents with  . Fall  . Hip Pain    Tara Brown is a 59 y.o. female.  The history is provided by the patient and medical records.  Fall This is a new problem. The current episode started 12 to 24 hours ago. The problem occurs constantly. The problem has not changed since onset.Pertinent negatives include no chest pain, no abdominal pain, no headaches and no shortness of breath. Nothing aggravates the symptoms. Nothing relieves the symptoms. She has tried nothing for the symptoms. The treatment provided no relief.  Hip Pain This is a new problem. Pertinent negatives include no chest pain, no abdominal pain, no headaches and no shortness of breath.       Past Medical History:  Diagnosis Date  . Anemia   . Arthritis   . Major depressive disorder, recurrent, severe without psychotic features ([place]) 03/11/2015  . Osteoporosis     Patient Active Problem List   Diagnosis Date Noted  . MDD (major depressive disorder) 02/17/2017  . Osteoporosis 05/29/2016  . HTN (hypertension) 05/29/2016  . MDD (major depressive disorder), recurrent severe, without psychosis ([place]) 05/28/2016  . Rheumatoid arthritis ([place]) 12/06/2015  . Anemia of chronic disease 03/08/2015    Past Surgical History:  Procedure Laterality Date  . AMPUTATION Left 12/07/2015   Procedure: AMPUTATION LEFT GREAT TOE;  Surgeon: Newt Minion, MD;  Location: [facility];  Service: Orthopedics;  Laterality: Left;  . FOOT SURGERY    . HAND SURGERY    . HIP FRACTURE SURGERY    . KNEE SURGERY    . NECK SURGERY       OB History   No obstetric history on file.     Family History  Problem Relation Age of Onset  . Bipolar disorder Sister   . Suicidality Other   . Schizophrenia Other   . Stroke Neg Hx     Social History   Tobacco Use  . Smoking status: Never  Smoker  . Smokeless tobacco: Never Used  Substance Use Topics  . Alcohol use: No  . Drug use: No    Home Medications Prior to Admission medications   Medication Sig Start Date End Date Taking? Authorizing Provider  hydrOXYzine (ATARAX/VISTARIL) 25 MG tablet Take 1 tablet (25 mg total) by mouth 3 (three) times daily as needed for anxiety. 02/21/17   Lindell Spar I, NP  oxyCODONE-acetaminophen (PERCOCET/ROXICET) 5-325 MG tablet Take 1 tablet by mouth every 8 (eight) hours as needed for severe pain. 02/21/17   Lindell Spar I, NP  predniSONE (DELTASONE) 10 MG tablet Take 1 tablet (10 mg total) by mouth daily with breakfast. For inflammation 02/21/17   Lindell Spar I, NP  sertraline (ZOLOFT) 50 MG tablet Take 3 tablets (150 mg total) by mouth daily. For depression 02/22/17   Lindell Spar I, NP  traZODone (DESYREL) 50 MG tablet Take 1 tablet (50 mg) at bedtime: For sleep 02/21/17   Encarnacion Slates, NP    Allergies    Patient has no known allergies.  Review of Systems   Review of Systems  Constitutional: Negative for chills, diaphoresis, fatigue and fever.  HENT: Negative for congestion and rhinorrhea.   Eyes: Negative for visual disturbance.  Respiratory: Negative for cough, chest tightness, shortness of breath, wheezing and stridor.   Cardiovascular: Negative for chest pain, palpitations and leg swelling.  Gastrointestinal: Negative for abdominal pain, constipation, diarrhea, nausea and vomiting.  Genitourinary: Negative for dysuria, flank pain, frequency and pelvic pain.  Musculoskeletal: Positive for back pain. Negative for neck pain and neck stiffness.  Skin: Negative for rash and wound.  Neurological: Negative for dizziness, seizures, weakness, light-headedness, numbness and headaches.  Psychiatric/Behavioral: Negative for agitation and confusion.  All other systems reviewed and are negative.   Physical Exam Updated Vital Signs BP 118/81 (BP Location: Left Arm)   Pulse 81   Temp 99  F (37.2 C) (Oral)   Resp 18   Ht 5\' 4"  (1.626 m)   Wt 47 kg   SpO2 95%   BMI 17.79 kg/m   Physical Exam Vitals and nursing note reviewed.  Constitutional:      General: She is not in acute distress.    Appearance: She is well-developed. She is not ill-appearing, toxic-appearing or diaphoretic.  HENT:     Head: Normocephalic and atraumatic.     Right Ear: External ear normal.     Left Ear: External ear normal.     Nose: Nose normal.     Mouth/Throat:     Mouth: Mucous membranes are dry.     Pharynx: No oropharyngeal exudate or posterior oropharyngeal erythema.  Eyes:     Conjunctiva/sclera: Conjunctivae normal.     Pupils: Pupils are equal, round, and reactive to light.  Cardiovascular:     Rate and Rhythm: Normal rate.     Pulses: Normal pulses.     Heart sounds: No murmur.  Pulmonary:     Effort: Pulmonary effort is normal. No respiratory distress.     Breath sounds: No stridor. No wheezing, rhonchi or rales.  Chest:     Chest wall: No tenderness.  Abdominal:     General: Abdomen is flat. There is no distension.     Tenderness: There is no abdominal tenderness. There is no rebound.  Musculoskeletal:        General: Tenderness and signs of injury present.     Cervical back: Normal range of motion and neck supple. No tenderness.     Thoracic back: No tenderness.     Lumbar back: Tenderness present.       Back:     Right lower leg: No edema.     Left lower leg: No edema.       Legs:     Comments: Arthritis and contractures on hands. Normal sensation and pulses in legs.   Skin:    General: Skin is warm.     Capillary Refill: Capillary refill takes less than 2 seconds.     Coloration: Skin is not pale.     Findings: No erythema or rash.  Neurological:     Mental Status: She is alert and oriented to person, place, and time.     Sensory: No sensory deficit.     Motor: No abnormal muscle tone.     Deep Tendon Reflexes: Reflexes are normal and symmetric.   Psychiatric:        Mood and Affect: Mood normal.     ED Results / Procedures / Treatments   Labs (all labs ordered are listed, but only abnormal results are displayed) Labs Reviewed  CBC WITH DIFFERENTIAL/PLATELET - Abnormal; Notable for the following components:      Result Value   RBC 3.10 (*)    Hemoglobin 8.4 (*)    HCT 27.4 (*)    RDW 18.6 (*)    Neutro Abs 8.0 (*)  All other components within normal limits  COMPREHENSIVE METABOLIC PANEL - Abnormal; Notable for the following components:   Sodium 133 (*)    Chloride 94 (*)    Glucose, Bld 124 (*)    Calcium 8.4 (*)    All other components within normal limits  CK - Abnormal; Notable for the following components:   Total CK 410 (*)    All other components within normal limits  CBC - Abnormal; Notable for the following components:   RBC 2.67 (*)    Hemoglobin 7.1 (*)    HCT 23.3 (*)    RDW 18.6 (*)    All other components within normal limits  SARS CORONAVIRUS 2 BY RT PCR (HOSPITAL ORDER, PERFORMED IN [facility])  CREATININE, SERUM  URINALYSIS, ROUTINE W REFLEX MICROSCOPIC  HIV ANTIBODY (ROUTINE TESTING W REFLEX)  CBC  BASIC METABOLIC PANEL  TYPE AND SCREEN    EKG EKG Interpretation  Date/Time:  Wednesday Feb 04 2020 16:51:47 EDT Ventricular Rate:  87 PR Interval:    QRS Duration: 75 QT Interval:  411 QTC Calculation: 495 R Axis:   75 Text Interpretation: Sinus rhythm Anteroseptal infarct, old Minimal ST depression, anterior leads When compared to prior, no significant cahnges seen. no STEMI Confirmed by Theda Belfast ([id number]) on 02/04/2020 5:17:27 PM   Radiology DG Chest 2 View  Result Date: 02/04/2020 CLINICAL DATA:  Fall EXAM: CHEST - 2 VIEW COMPARISON:  01/08/2020 FINDINGS: Examination is limited by severe scoliosis. Lateral view limited by patient's arms and positioning. No gross pleural effusion. Linear atelectasis at the left base. Stable cardiomediastinal silhouette. Old appearing  bilateral rib fractures. No gross pneumothorax allowing for positioning and arc technique. Chronic deformities of the proximal humeri. IMPRESSION: Limited by positioning and technique. Subsegmental atelectasis or scar at the left base. Electronically Signed   By: Jasmine Pang M.D.   On: 02/04/2020 18:15   DG Knee 1-2 Views Right  Result Date: 02/04/2020 CLINICAL DATA:  Fall with pain EXAM: RIGHT KNEE - 1-2 VIEW COMPARISON:  None. FINDINGS: Prior knee replacement with intact hardware and normal alignment. Bones appear osteopenic. No acute displaced fracture or malalignment. No large knee effusion. Diffuse soft tissue atrophy. Possible chronic fracture deformity of the proximal shaft of the fibula IMPRESSION: Right knee replacement without acute osseous abnormality Electronically Signed   By: Jasmine Pang M.D.   On: 02/04/2020 18:28   CT ABDOMEN PELVIS W CONTRAST  Result Date: 02/04/2020 CLINICAL DATA:  Larey Seat, found down, right hip and pelvic pain EXAM: CT ABDOMEN AND PELVIS WITH CONTRAST TECHNIQUE: Multidetector CT imaging of the abdomen and pelvis was performed using the standard protocol following bolus administration of intravenous contrast. CONTRAST:  80mL OMNIPAQUE IOHEXOL 300 MG/ML  SOLN COMPARISON:  08/17/2010, 02/04/2020 FINDINGS: Lower chest: Hypoventilatory changes are seen at the left lung base. No acute pleural or parenchymal lung disease. Hepatobiliary: Liver compatible with hemangioma 2.6 cm hypodensity right lobe. No intrahepatic duct dilation. The gallbladder is unremarkable. Pancreas: Unremarkable. No pancreatic ductal dilatation or surrounding inflammatory changes. Spleen: Normal in size without focal abnormality. Adrenals/Urinary Tract: There is bilateral renal cortical atrophy, with small bilateral renal cortical cysts. No urinary tract calculi or obstructive uropathy. Bladder is decompressed. The adrenals are unremarkable. Stomach/Bowel: There is a large amount of stool within the rectal  vault consistent with fecal impaction. No bowel obstruction or ileus. No bowel wall thickening or inflammatory change. Vascular/Lymphatic: Aortic atherosclerosis. No enlarged abdominal or pelvic lymph nodes. Reproductive: Uterus and bilateral  adnexa are unremarkable. Other: No free fluid or free gas.  No abdominal wall hernia. Musculoskeletal: There is a mildly comminuted left iliac bone fracture, extending through the left acetabular roof. Fracture line extends along the superolateral margin of the acetabular component of the left hip arthroplasty. There is a markedly comminuted and impacted inter trochanteric proximal right femur fracture. Postsurgical changes right iliac crest. Prior healed bilateral pubic rami fractures. Reconstructed images demonstrate no additional findings. IMPRESSION: 1. Comminuted proximal right femoral intertrochanteric fracture. 2. Comminuted fracture left iliac bone, involving the superior lateral aspect of the femoral component of the left hip arthroplasty. 3. Fecal impaction. Electronically Signed   By: Sharlet Salina M.D.   On: 02/04/2020 20:26   CT L-SPINE NO CHARGE  Result Date: 02/04/2020 CLINICAL DATA:  Larey Seat, hip fracture EXAM: CT LUMBAR SPINE WITHOUT CONTRAST TECHNIQUE: Multidetector CT imaging of the lumbar spine was performed without intravenous contrast administration. Multiplanar CT image reconstructions were also generated. COMPARISON:  04/24/2019 FINDINGS: Segmentation: 5 non-rib-bearing lumbar type vertebral bodies. Alignment: Mild left convex curvature centered at the thoracolumbar junction. Otherwise alignment is anatomic. Vertebrae: No acute displaced fractures. Paraspinal and other soft tissues: Significant retained stool within the rectum. Paraspinal soft tissues are unremarkable. Disc levels: There is prominent spondylosis at T11/T12, without significant compressive sequela. Disc spaces of the lumbar spine are well preserved. IMPRESSION: 1. No acute displaced  lumbar spine fracture. Electronically Signed   By: Sharlet Salina M.D.   On: 02/04/2020 20:29   DG Hips Bilat W or Wo Pelvis 5 Views  Result Date: 02/04/2020 CLINICAL DATA:  Larey Seat at night EXAM: DG HIP (WITH OR WITHOUT PELVIS) 5+V BILAT COMPARISON:  12/05/2019, 12/03/2019, 11/09/2016 FINDINGS: Previous surgical plate and multiple screw fixation of the right iliac bone with multiple screw fracture and screw fragments. Acute comminuted fracture involving the right trochanter and subtrochanteric femur with varus angulation the distal fracture fragment and less than 1/4 shaft diameter displacement of distal fragment toward the midline. The femoral head on the right is normally position. Prior left hip replacement without dislocation. Surgical mesh and multiple wire fragments are again visualized. Amorphous material caudal to the femoral stem without change. Acute mildly displaced fracture through the left iliac bone, extending from the crest to the lateral aspect of the acetabulum. Chronic appearing fracture deformities of the bilateral pubic rami. IMPRESSION: 1. Prior left hip replacement without dislocation. Acute mildly displaced fracture within the left iliac bone extending from the crest to the superolateral acetabular margin. 2. Acute mildly comminuted, displaced and angulated fracture involving the proximal right femur, involves the trochanter and subtrochanteric femur. Electronically Signed   By: Jasmine Pang M.D.   On: 02/04/2020 18:27    Procedures Procedures (including critical care time)  Medications Ordered in ED Medications  methotrexate (RHEUMATREX) tablet 12.5 mg (has no administration in time range)  lisinopril (ZESTRIL) tablet 40 mg (has no administration in time range)  buPROPion (WELLBUTRIN XL) 24 hr tablet 150 mg (has no administration in time range)  escitalopram (LEXAPRO) tablet 20 mg (has no administration in time range)  predniSONE (DELTASONE) tablet 5 mg (has no administration  in time range)  HYDROcodone-acetaminophen (NORCO/VICODIN) 5-325 MG per tablet 1-2 tablet (has no administration in time range)  morphine 2 MG/ML injection 0.5 mg (has no administration in time range)  heparin injection 5,000 Units (has no administration in time range)  0.9 %  sodium chloride infusion (has no administration in time range)  senna-docusate (Senokot-S) tablet 1 tablet (has no  administration in time range)  ondansetron (ZOFRAN) injection 4 mg (has no administration in time range)  fentaNYL (SUBLIMAZE) injection 50 mcg (50 mcg Intravenous Given 02/04/20 1718)  morphine 4 MG/ML injection 4 mg (4 mg Intravenous Given 02/04/20 1949)  iohexol (OMNIPAQUE) 300 MG/ML solution 100 mL (80 mLs Intravenous Contrast Given 02/04/20 1954)  morphine 4 MG/ML injection 4 mg (4 mg Intravenous Given 02/04/20 2317)    ED Course  I have reviewed the triage vital signs and the nursing notes.  Pertinent labs & imaging results that were available during my care of the patient were reviewed by me and considered in my medical decision making (see chart for details).    MDM Rules/Calculators/A&P                      Tara Brown is a 59 y.o. female with a past medical history significant for extensive osteoporosis and arthritis, rheumatoid arthritis, depression, hypertension, anemia of chronic disease, and prior hip surgeries who presents with a fall and prolonged downtime.  Patient reports that she had a mechanical fall last night around 8:30 PM when she was trying to get between a dresser in the bed and she could not get back up.  She reports that she fell landing on her hip and is having pain in her bilateral hip/pelvis area, low back, and right knee.  She reports the pain from her back does radiate down both of her legs.  She reports she has not been able to walk since the fall.  She reports the pain is a 9 out of 10 in severity and very severe.  She is concerned she may have broken something again.  She  reports no preceding symptoms such as fevers, chills, chest pain, shortness of breath, palpitations, cough, urinary, or GI symptoms.  She denies any lacerations.  She denied any episodes leading to the fall.  On exam, patient is very cachectic and is tender in her pelvis bilaterally and the right hip most fall.  She does have tenderness in her low back.  Patient tenderness in her right knee.  She has arthritic joints on exam.  Lungs were clear and chest was nontender.  She does report prior rib fractures but denies any chest pain at this time.  She was down for over 12 hours.  Clinically I am concerned about recurrent traumatic injuries.  We will get x-rays of the pelvis, hips, right knee, and chest.  Will need imaging of her low back however we will wait until x-rays performed of the hips and pelvis but I anticipate she will need a CT abdomen and pelvis with lumbar recons to further evaluate given her complex pelvis history.  She will be given fentanyl and get some blood work including CK given her prolonged downtime.  Anticipate reassessment after work-up.   Patient's laboratory testing reveals mild elevation of CK at 14.  Mild anemia but no leukocytosis.  Kidney function normal.  Liver function is elevated.  Covid test was ordered.  X-ray of the pelvis and hips revealed a comminuted proximal right femoral intertrochanteric fracture and a comminuted left iliac bone fracture.  CT will be ordered to further characterize as well as a lumbar CT given the tenderness on her back.  CT confirmed the comminuted proximal right femoral intertrochanteric fracture and a comminuted fracture of the left iliac bone involving the superior lateral aspect of the femoral component of the left hip arthroplasty with the acetabulum.  There was  also some fecal impaction.  Patient was denying constipation or rectal pain on my initial evaluation.  10:55 PM Just spoke with Dr. Doroteo Glassman with the [facility] [place] [facility] team as the patient's orthopedist, Dr. Gaynelle Cage did her left hip total joint revision within the last 2 months.  He feels that the patient can likely have the right trochanteric fracture managed at this facility by our orthopedics team and she can be admitted here and then the admitting team can speak with Dr. Katrinka Blazing tomorrow about the left pelvic injuries going to the acetabulum that is approximately periprosthetic.  We will call the orthopedics team and discuss admission.  11:09 PM Just spoke with Dr. Dion Saucier with our orthopedics team and he request patient admitted to medicine service and made n.p.o. at midnight for likely repair of the right intertrochanteric fracture tomorrow.  They will then likely touch base with the [place] [facility] orthoped[facility] team again about further management on the left side.    Final Clinical Impression(s) / ED Diagnoses Final diagnoses:  Fall, initial encounter  Closed fracture of right hip, initial encounter (HCC)  Closed nondisplaced fracture of left acetabulum, unspecified portion of acetabulum, initial encounter (HCC)  Closed fracture of left iliac crest, initial encounter (HCC)    Rx / DC Orders ED Discharge Orders    None      . Clinical Impression: 1. Fall, initial encounter   2. Fall   3. Closed fracture of right hip, initial encounter (HCC)   4. Closed nondisplaced fracture of left acetabulum, unspecified portion of acetabulum, initial encounter (HCC)   5. Closed fracture of left iliac crest, initial encounter [facility])     Disposition: Admit  This note was prepared with assistance of Dragon voice recognition software. Occasional wrong-word or sound-a-like substitutions may have occurred due to the inherent limitations of voice recognition software.     Inez Rosato, Canary Brim, MD 02/05/20 [phone]

## 2020-02-04 NOTE — Progress Notes (Signed)
Patient with complex constellation of injuries, right subtrochanteric femur fracture, left periprosthetic acetabular/pelvic fracture.   Plan for IM nail right femur tomorrow if medically optimized.  Likely nonsurgical management of left pelvis.    Full consult to follow.  Npo after midnight.  Surgery likely early afternoon 5/27.   Eulas Post, MD

## 2020-02-04 NOTE — ED Notes (Signed)
Pt transported to Xray at this time.

## 2020-02-05 ENCOUNTER — Inpatient Hospital Stay (HOSPITAL_COMMUNITY): Payer: Medicare HMO

## 2020-02-05 ENCOUNTER — Encounter (HOSPITAL_COMMUNITY): Payer: Self-pay | Admitting: Internal Medicine

## 2020-02-05 ENCOUNTER — Encounter (HOSPITAL_COMMUNITY)
Admission: EM | Disposition: A | Discharge status: Home Health | Payer: Self-pay | Source: Home / Self Care | Attending: Internal Medicine

## 2020-02-05 ENCOUNTER — Inpatient Hospital Stay (HOSPITAL_COMMUNITY): Payer: Medicare HMO | Admitting: Certified Registered Nurse Anesthetist

## 2020-02-05 DIAGNOSIS — E43 Unspecified severe protein-calorie malnutrition: Secondary | ICD-10-CM | POA: Diagnosis present

## 2020-02-05 DIAGNOSIS — I10 Essential (primary) hypertension: Secondary | ICD-10-CM | POA: Diagnosis present

## 2020-02-05 DIAGNOSIS — S32402A Unspecified fracture of left acetabulum, initial encounter for closed fracture: Secondary | ICD-10-CM

## 2020-02-05 DIAGNOSIS — Z7189 Other specified counseling: Secondary | ICD-10-CM | POA: Diagnosis not present

## 2020-02-05 DIAGNOSIS — M419 Scoliosis, unspecified: Secondary | ICD-10-CM | POA: Diagnosis present

## 2020-02-05 DIAGNOSIS — Y92009 Unspecified place in unspecified non-institutional (private) residence as the place of occurrence of the external cause: Secondary | ICD-10-CM | POA: Diagnosis not present

## 2020-02-05 DIAGNOSIS — J9602 Acute respiratory failure with hypercapnia: Secondary | ICD-10-CM | POA: Diagnosis not present

## 2020-02-05 DIAGNOSIS — M8580 Other specified disorders of bone density and structure, unspecified site: Secondary | ICD-10-CM

## 2020-02-05 DIAGNOSIS — W19XXXA Unspecified fall, initial encounter: Secondary | ICD-10-CM | POA: Diagnosis not present

## 2020-02-05 DIAGNOSIS — D509 Iron deficiency anemia, unspecified: Secondary | ICD-10-CM | POA: Diagnosis present

## 2020-02-05 DIAGNOSIS — J189 Pneumonia, unspecified organism: Secondary | ICD-10-CM | POA: Diagnosis not present

## 2020-02-05 DIAGNOSIS — Z681 Body mass index (BMI) 19 or less, adult: Secondary | ICD-10-CM | POA: Diagnosis not present

## 2020-02-05 DIAGNOSIS — S32302A Unspecified fracture of left ilium, initial encounter for closed fracture: Secondary | ICD-10-CM | POA: Diagnosis present

## 2020-02-05 DIAGNOSIS — S72009A Fracture of unspecified part of neck of unspecified femur, initial encounter for closed fracture: Secondary | ICD-10-CM | POA: Diagnosis not present

## 2020-02-05 DIAGNOSIS — Z515 Encounter for palliative care: Secondary | ICD-10-CM | POA: Diagnosis not present

## 2020-02-05 DIAGNOSIS — W1830XA Fall on same level, unspecified, initial encounter: Secondary | ICD-10-CM | POA: Diagnosis present

## 2020-02-05 DIAGNOSIS — S72001A Fracture of unspecified part of neck of right femur, initial encounter for closed fracture: Secondary | ICD-10-CM | POA: Diagnosis not present

## 2020-02-05 DIAGNOSIS — S7221XA Displaced subtrochanteric fracture of right femur, initial encounter for closed fracture: Secondary | ICD-10-CM | POA: Diagnosis present

## 2020-02-05 DIAGNOSIS — R296 Repeated falls: Secondary | ICD-10-CM | POA: Diagnosis present

## 2020-02-05 DIAGNOSIS — G4700 Insomnia, unspecified: Secondary | ICD-10-CM | POA: Diagnosis present

## 2020-02-05 DIAGNOSIS — I959 Hypotension, unspecified: Secondary | ICD-10-CM | POA: Diagnosis present

## 2020-02-05 DIAGNOSIS — Z96642 Presence of left artificial hip joint: Secondary | ICD-10-CM | POA: Diagnosis present

## 2020-02-05 DIAGNOSIS — R0902 Hypoxemia: Secondary | ICD-10-CM | POA: Diagnosis not present

## 2020-02-05 DIAGNOSIS — J9601 Acute respiratory failure with hypoxia: Secondary | ICD-10-CM | POA: Diagnosis not present

## 2020-02-05 DIAGNOSIS — M069 Rheumatoid arthritis, unspecified: Secondary | ICD-10-CM | POA: Diagnosis present

## 2020-02-05 DIAGNOSIS — R339 Retention of urine, unspecified: Secondary | ICD-10-CM | POA: Diagnosis not present

## 2020-02-05 DIAGNOSIS — Z66 Do not resuscitate: Secondary | ICD-10-CM | POA: Diagnosis not present

## 2020-02-05 DIAGNOSIS — F329 Major depressive disorder, single episode, unspecified: Secondary | ICD-10-CM | POA: Diagnosis present

## 2020-02-05 DIAGNOSIS — Z20822 Contact with and (suspected) exposure to covid-19: Secondary | ICD-10-CM | POA: Diagnosis present

## 2020-02-05 DIAGNOSIS — D519 Vitamin B12 deficiency anemia, unspecified: Secondary | ICD-10-CM | POA: Diagnosis present

## 2020-02-05 DIAGNOSIS — R64 Cachexia: Secondary | ICD-10-CM | POA: Diagnosis present

## 2020-02-05 DIAGNOSIS — D62 Acute posthemorrhagic anemia: Secondary | ICD-10-CM | POA: Diagnosis not present

## 2020-02-05 DIAGNOSIS — M810 Age-related osteoporosis without current pathological fracture: Secondary | ICD-10-CM | POA: Diagnosis present

## 2020-02-05 HISTORY — PX: INTRAMEDULLARY (IM) NAIL INTERTROCHANTERIC: SHX5875

## 2020-02-05 LAB — CBC
HCT: 23.3 % — ABNORMAL LOW (ref 36.0–46.0)
HCT: 23.6 % — ABNORMAL LOW (ref 36.0–46.0)
Hemoglobin: 7.1 g/dL — ABNORMAL LOW (ref 12.0–15.0)
Hemoglobin: 7.2 g/dL — ABNORMAL LOW (ref 12.0–15.0)
MCH: 26.6 pg (ref 26.0–34.0)
MCH: 27.1 pg (ref 26.0–34.0)
MCHC: 30.5 g/dL (ref 30.0–36.0)
MCHC: 30.5 g/dL (ref 30.0–36.0)
MCV: 87.3 fL (ref 80.0–100.0)
MCV: 88.7 fL (ref 80.0–100.0)
Platelets: 321 10*3/uL (ref 150–400)
Platelets: 316 10*3/uL (ref 150–400)
RBC: 2.67 MIL/uL — ABNORMAL LOW (ref 3.87–5.11)
RBC: 2.66 MIL/uL — ABNORMAL LOW (ref 3.87–5.11)
RDW: 18.6 % — ABNORMAL HIGH (ref 11.5–15.5)
RDW: 18.6 % — ABNORMAL HIGH (ref 11.5–15.5)
WBC: 7.6 10*3/uL (ref 4.0–10.5)
WBC: 8 10*3/uL (ref 4.0–10.5)
nRBC: 0 % (ref 0.0–0.2)
nRBC: 0 % (ref 0.0–0.2)

## 2020-02-05 LAB — BASIC METABOLIC PANEL
Anion gap: 14 (ref 5–15)
BUN: 19 mg/dL (ref 6–20)
CO2: 24 mmol/L (ref 22–32)
Calcium: 7.8 mg/dL — ABNORMAL LOW (ref 8.9–10.3)
Chloride: 96 mmol/L — ABNORMAL LOW (ref 98–111)
Creatinine, Ser: 0.89 mg/dL (ref 0.44–1.00)
GFR calc Af Amer: >60 mL/min (ref >60)
GFR calc non Af Amer: >60 mL/min (ref >60)
Glucose, Bld: 96 mg/dL (ref 70–99)
Potassium: 4 mmol/L (ref 3.5–5.1)
Sodium: 134 mmol/L — ABNORMAL LOW (ref 135–145)

## 2020-02-05 LAB — IRON AND TIBC
Iron: 11 ug/dL — ABNORMAL LOW (ref 28–170)
Saturation Ratios: 4 % — ABNORMAL LOW (ref 10.4–31.8)
TIBC: 251 ug/dL (ref 250–450)
UIBC: 240 ug/dL

## 2020-02-05 LAB — PREPARE RBC (CROSSMATCH)

## 2020-02-05 LAB — FOLATE: Folate: 12.2 ng/mL (ref >5.9)

## 2020-02-05 LAB — VITAMIN B12: Vitamin B-12: 198 pg/mL (ref 180–914)

## 2020-02-05 LAB — HIV ANTIBODY (ROUTINE TESTING W REFLEX): HIV Screen 4th Generation wRfx: NONREACTIVE

## 2020-02-05 LAB — CREATININE, SERUM
Creatinine, Ser: 0.85 mg/dL (ref 0.44–1.00)
GFR calc Af Amer: >60 mL/min (ref >60)
GFR calc non Af Amer: >60 mL/min (ref >60)

## 2020-02-05 LAB — FERRITIN: Ferritin: 63 ng/mL (ref 11–307)

## 2020-02-05 LAB — SARS CORONAVIRUS 2 BY RT PCR (HOSPITAL ORDER, PERFORMED IN ~~LOC~~ HOSPITAL LAB): SARS Coronavirus 2: NEGATIVE

## 2020-02-05 SURGERY — FIXATION, FRACTURE, INTERTROCHANTERIC, WITH INTRAMEDULLARY ROD
Anesthesia: General | Laterality: Right

## 2020-02-05 MED ORDER — PHENOL 1.4 % MT LIQD: 1.0000 | OROMUCOSAL | Status: DC | PRN

## 2020-02-05 MED ORDER — CEFAZOLIN SODIUM-DEXTROSE 2-4 GM/100ML-% IV SOLN
2.0000 g | INTRAVENOUS | Status: AC
  Administered 2020-02-05: 2 g via INTRAVENOUS

## 2020-02-05 MED ORDER — HYDROCODONE-ACETAMINOPHEN 5-325 MG PO TABS: 1.0000 | ORAL_TABLET | Freq: Four times a day (QID) | ORAL | Status: DC | PRN

## 2020-02-05 MED ORDER — CEFAZOLIN SODIUM-DEXTROSE 2-4 GM/100ML-% IV SOLN: 2.0000 g | INTRAVENOUS | Status: DC

## 2020-02-05 MED ORDER — 0.9 % SODIUM CHLORIDE (POUR BTL) OPTIME
TOPICAL | Status: DC | PRN
  Administered 2020-02-05: 1000 mL

## 2020-02-05 MED ORDER — PHENYLEPHRINE 40 MCG/ML (10ML) SYRINGE FOR IV PUSH (FOR BLOOD PRESSURE SUPPORT)
PREFILLED_SYRINGE | INTRAVENOUS | Status: AC
  Filled 2020-02-05: qty 30

## 2020-02-05 MED ORDER — PROPOFOL 10 MG/ML IV BOLUS
INTRAVENOUS | Status: DC | PRN
  Administered 2020-02-05: 100 mg via INTRAVENOUS

## 2020-02-05 MED ORDER — SODIUM CHLORIDE (PF) 0.9 % IJ SOLN
INTRAMUSCULAR | Status: AC
  Filled 2020-02-05: qty 20

## 2020-02-05 MED ORDER — ONDANSETRON HCL 4 MG/2ML IJ SOLN
INTRAMUSCULAR | Status: DC | PRN
  Administered 2020-02-05: 4 mg via INTRAVENOUS

## 2020-02-05 MED ORDER — HYDROCODONE-ACETAMINOPHEN 7.5-325 MG PO TABS
1.0000 | ORAL_TABLET | ORAL | Status: DC | PRN
  Administered 2020-02-05 – 2020-02-06 (×3): 2 via ORAL
  Administered 2020-02-07 (×2): 1 via ORAL
  Filled 2020-02-05: qty 1
  Filled 2020-02-05: qty 2
  Filled 2020-02-05: qty 1
  Filled 2020-02-05: qty 2

## 2020-02-05 MED ORDER — SENNOSIDES-DOCUSATE SODIUM 8.6-50 MG PO TABS: 1.0000 | ORAL_TABLET | Freq: Every evening | ORAL | Status: DC | PRN

## 2020-02-05 MED ORDER — FENTANYL CITRATE (PF) 100 MCG/2ML IJ SOLN
INTRAMUSCULAR | Status: DC | PRN
  Administered 2020-02-05: 50 ug via INTRAVENOUS
  Administered 2020-02-05 (×4): 25 ug via INTRAVENOUS
  Administered 2020-02-05: 50 ug via INTRAVENOUS

## 2020-02-05 MED ORDER — BUPIVACAINE HCL (PF) 0.25 % IJ SOLN
INTRAMUSCULAR | Status: DC | PRN
  Administered 2020-02-05: 30 mL

## 2020-02-05 MED ORDER — FENTANYL CITRATE (PF) 100 MCG/2ML IJ SOLN
INTRAMUSCULAR | Status: AC
  Filled 2020-02-05: qty 2

## 2020-02-05 MED ORDER — MAGNESIUM CITRATE PO SOLN
1.0000 | Freq: Once | ORAL | Status: AC | PRN
  Administered 2020-02-13: 1 via ORAL
  Filled 2020-02-05: qty 296

## 2020-02-05 MED ORDER — CEFAZOLIN SODIUM-DEXTROSE 2-4 GM/100ML-% IV SOLN
INTRAVENOUS | Status: AC
  Filled 2020-02-05: qty 100

## 2020-02-05 MED ORDER — FENTANYL CITRATE (PF) 250 MCG/5ML IJ SOLN: INTRAMUSCULAR | Status: DC | PRN

## 2020-02-05 MED ORDER — TRANEXAMIC ACID-NACL 1000-0.7 MG/100ML-% IV SOLN
1000.0000 mg | INTRAVENOUS | Status: AC
  Administered 2020-02-05: 1000 mg via INTRAVENOUS
  Filled 2020-02-05: qty 100

## 2020-02-05 MED ORDER — LIDOCAINE 2% (20 MG/ML) 5 ML SYRINGE
INTRAMUSCULAR | Status: AC
  Filled 2020-02-05: qty 5

## 2020-02-05 MED ORDER — LIP MEDEX EX OINT
TOPICAL_OINTMENT | CUTANEOUS | Status: AC
  Filled 2020-02-05: qty 7

## 2020-02-05 MED ORDER — VITAMIN B-12 1000 MCG PO TABS
1000.0000 ug | ORAL_TABLET | Freq: Every day | ORAL | Status: DC
  Administered 2020-02-06 – 2020-02-18 (×12): 1000 ug via ORAL
  Filled 2020-02-05 (×13): qty 1

## 2020-02-05 MED ORDER — ACETAMINOPHEN 500 MG PO TABS: 1000.0000 mg | ORAL_TABLET | Freq: Once | ORAL | Status: DC

## 2020-02-05 MED ORDER — SENNA 8.6 MG PO TABS
1.0000 | ORAL_TABLET | Freq: Two times a day (BID) | ORAL | Status: DC
  Administered 2020-02-06 – 2020-02-18 (×17): 8.6 mg via ORAL
  Filled 2020-02-05 (×21): qty 1

## 2020-02-05 MED ORDER — ESCITALOPRAM OXALATE 20 MG PO TABS
20.0000 mg | ORAL_TABLET | Freq: Every day | ORAL | Status: DC
  Administered 2020-02-06 – 2020-02-18 (×12): 20 mg via ORAL
  Filled 2020-02-05 (×13): qty 1

## 2020-02-05 MED ORDER — ONDANSETRON HCL 4 MG/2ML IJ SOLN
INTRAMUSCULAR | Status: AC
  Filled 2020-02-05: qty 4

## 2020-02-05 MED ORDER — LIP MEDEX EX OINT: TOPICAL_OINTMENT | CUTANEOUS | Status: DC | PRN

## 2020-02-05 MED ORDER — BUPROPION HCL ER (XL) 150 MG PO TB24
150.0000 mg | ORAL_TABLET | Freq: Every day | ORAL | Status: DC
  Administered 2020-02-06 – 2020-02-18 (×12): 150 mg via ORAL
  Filled 2020-02-05 (×12): qty 1

## 2020-02-05 MED ORDER — ALUM & MAG HYDROXIDE-SIMETH 200-200-20 MG/5ML PO SUSP
30.0000 mL | ORAL | Status: DC | PRN
  Administered 2020-02-13 (×2): 30 mL via ORAL
  Filled 2020-02-05 (×2): qty 30

## 2020-02-05 MED ORDER — SUCCINYLCHOLINE CHLORIDE 200 MG/10ML IV SOSY
PREFILLED_SYRINGE | INTRAVENOUS | Status: DC | PRN
  Administered 2020-02-05: 100 mg via INTRAVENOUS

## 2020-02-05 MED ORDER — DOCUSATE SODIUM 100 MG PO CAPS
100.0000 mg | ORAL_CAPSULE | Freq: Two times a day (BID) | ORAL | Status: DC
  Administered 2020-02-06 – 2020-02-18 (×16): 100 mg via ORAL
  Filled 2020-02-05 (×20): qty 1

## 2020-02-05 MED ORDER — LISINOPRIL 20 MG PO TABS: 40.0000 mg | ORAL_TABLET | Freq: Every day | ORAL | Status: DC

## 2020-02-05 MED ORDER — BUPIVACAINE HCL (PF) 0.25 % IJ SOLN
INTRAMUSCULAR | Status: AC
  Filled 2020-02-05: qty 30

## 2020-02-05 MED ORDER — POVIDONE-IODINE 10 % EX SWAB
2.0000 "application " | Freq: Once | CUTANEOUS | Status: AC
  Administered 2020-02-05: 2 via TOPICAL

## 2020-02-05 MED ORDER — POLYETHYLENE GLYCOL 3350 17 G PO PACK: 17.0000 g | PACK | Freq: Every day | ORAL | Status: DC | PRN

## 2020-02-05 MED ORDER — BISACODYL 10 MG RE SUPP: 10.0000 mg | Freq: Every day | RECTAL | Status: DC | PRN

## 2020-02-05 MED ORDER — CHLORHEXIDINE GLUCONATE 4 % EX LIQD: 60.0000 mL | Freq: Once | CUTANEOUS | Status: DC

## 2020-02-05 MED ORDER — MEPIVACAINE HCL (PF) 2 % IJ SOLN
INTRAMUSCULAR | Status: AC
  Filled 2020-02-05: qty 20

## 2020-02-05 MED ORDER — MIDAZOLAM HCL 2 MG/2ML IJ SOLN
INTRAMUSCULAR | Status: AC
  Filled 2020-02-05: qty 2

## 2020-02-05 MED ORDER — ONDANSETRON HCL 4 MG/2ML IJ SOLN
4.0000 mg | Freq: Four times a day (QID) | INTRAMUSCULAR | Status: DC | PRN
  Administered 2020-02-13: 4 mg via INTRAVENOUS
  Filled 2020-02-05: qty 2

## 2020-02-05 MED ORDER — PHENYLEPHRINE HCL-NACL 10-0.9 MG/250ML-% IV SOLN
INTRAVENOUS | Status: DC | PRN
  Administered 2020-02-05: 25 ug/min via INTRAVENOUS

## 2020-02-05 MED ORDER — HEPARIN SODIUM (PORCINE) 5000 UNIT/ML IJ SOLN
5000.0000 [IU] | Freq: Three times a day (TID) | INTRAMUSCULAR | Status: DC
  Administered 2020-02-05: 5000 [IU] via SUBCUTANEOUS
  Filled 2020-02-05: qty 1

## 2020-02-05 MED ORDER — ROCURONIUM BROMIDE 10 MG/ML (PF) SYRINGE
PREFILLED_SYRINGE | INTRAVENOUS | Status: AC
  Filled 2020-02-05: qty 10

## 2020-02-05 MED ORDER — HYDROMORPHONE HCL 1 MG/ML IJ SOLN: 0.2500 mg | INTRAMUSCULAR | Status: DC | PRN

## 2020-02-05 MED ORDER — HYDROCODONE-ACETAMINOPHEN 7.5-325 MG PO TABS
ORAL_TABLET | ORAL | Status: AC
  Filled 2020-02-05: qty 2

## 2020-02-05 MED ORDER — ONDANSETRON HCL 4 MG/2ML IJ SOLN: 4.0000 mg | Freq: Four times a day (QID) | INTRAMUSCULAR | Status: DC | PRN

## 2020-02-05 MED ORDER — CEFAZOLIN SODIUM-DEXTROSE 2-4 GM/100ML-% IV SOLN
2.0000 g | Freq: Four times a day (QID) | INTRAVENOUS | Status: AC
  Administered 2020-02-05 – 2020-02-06 (×2): 2 g via INTRAVENOUS
  Filled 2020-02-05: qty 100

## 2020-02-05 MED ORDER — LIDOCAINE 2% (20 MG/ML) 5 ML SYRINGE
INTRAMUSCULAR | Status: AC
  Filled 2020-02-05: qty 10

## 2020-02-05 MED ORDER — MENTHOL 3 MG MT LOZG: 1.0000 | LOZENGE | OROMUCOSAL | Status: DC | PRN

## 2020-02-05 MED ORDER — DEXAMETHASONE SODIUM PHOSPHATE 10 MG/ML IJ SOLN
INTRAMUSCULAR | Status: DC | PRN
  Administered 2020-02-05: 10 mg via INTRAVENOUS

## 2020-02-05 MED ORDER — ACETAMINOPHEN 500 MG PO TABS: 500.0000 mg | ORAL_TABLET | Freq: Four times a day (QID) | ORAL | Status: AC

## 2020-02-05 MED ORDER — SODIUM CHLORIDE 0.9 % IV SOLN: INTRAVENOUS | Status: DC

## 2020-02-05 MED ORDER — MORPHINE SULFATE (PF) 2 MG/ML IV SOLN
0.5000 mg | INTRAVENOUS | Status: DC | PRN
  Administered 2020-02-05 (×3): 0.5 mg via INTRAVENOUS
  Filled 2020-02-05 (×3): qty 1

## 2020-02-05 MED ORDER — LIDOCAINE 2% (20 MG/ML) 5 ML SYRINGE
INTRAMUSCULAR | Status: DC | PRN
  Administered 2020-02-05: 40 mg via INTRAVENOUS
  Administered 2020-02-05: 100 mg via INTRAVENOUS

## 2020-02-05 MED ORDER — EPHEDRINE 5 MG/ML INJ
INTRAVENOUS | Status: AC
  Filled 2020-02-05: qty 10

## 2020-02-05 MED ORDER — SUCCINYLCHOLINE CHLORIDE 200 MG/10ML IV SOSY
PREFILLED_SYRINGE | INTRAVENOUS | Status: AC
  Filled 2020-02-05: qty 20

## 2020-02-05 MED ORDER — DEXAMETHASONE SODIUM PHOSPHATE 10 MG/ML IJ SOLN
INTRAMUSCULAR | Status: AC
  Filled 2020-02-05: qty 2

## 2020-02-05 MED ORDER — PREDNISONE 5 MG PO TABS
5.0000 mg | ORAL_TABLET | Freq: Every day | ORAL | Status: DC
  Administered 2020-02-06 – 2020-02-18 (×12): 5 mg via ORAL
  Filled 2020-02-05 (×14): qty 1

## 2020-02-05 MED ORDER — MIDAZOLAM HCL 2 MG/2ML IJ SOLN
INTRAMUSCULAR | Status: DC | PRN
  Administered 2020-02-05: 2 mg via INTRAVENOUS

## 2020-02-05 MED ORDER — LACTATED RINGERS IV SOLN: INTRAVENOUS | Status: DC

## 2020-02-05 MED ORDER — ACETAMINOPHEN 325 MG PO TABS
325.0000 mg | ORAL_TABLET | Freq: Four times a day (QID) | ORAL | Status: DC | PRN
  Administered 2020-02-18: 650 mg via ORAL
  Filled 2020-02-05: qty 2

## 2020-02-05 MED ORDER — KETOROLAC TROMETHAMINE 15 MG/ML IJ SOLN
7.5000 mg | Freq: Four times a day (QID) | INTRAMUSCULAR | Status: AC
  Administered 2020-02-05 – 2020-02-06 (×3): 7.5 mg via INTRAVENOUS
  Filled 2020-02-05 (×2): qty 1

## 2020-02-05 MED ORDER — STERILE WATER FOR IRRIGATION IR SOLN
Status: DC | PRN
  Administered 2020-02-05: 2000 mL

## 2020-02-05 MED ORDER — SODIUM CHLORIDE 0.9% IV SOLUTION: Freq: Once | INTRAVENOUS | Status: DC

## 2020-02-05 MED ORDER — SODIUM CHLORIDE 0.9 % IV SOLN: INTRAVENOUS | Status: DC | PRN

## 2020-02-05 MED ORDER — PHENYLEPHRINE 40 MCG/ML (10ML) SYRINGE FOR IV PUSH (FOR BLOOD PRESSURE SUPPORT)
PREFILLED_SYRINGE | INTRAVENOUS | Status: DC | PRN
  Administered 2020-02-05: 40 ug via INTRAVENOUS
  Administered 2020-02-05: 80 ug via INTRAVENOUS

## 2020-02-05 MED ORDER — PHENYLEPHRINE HCL (PRESSORS) 10 MG/ML IV SOLN
INTRAVENOUS | Status: AC
  Filled 2020-02-05: qty 7

## 2020-02-05 MED ORDER — KETOROLAC TROMETHAMINE 15 MG/ML IJ SOLN
INTRAMUSCULAR | Status: AC
  Filled 2020-02-05: qty 1

## 2020-02-05 MED ORDER — HYDROCODONE-ACETAMINOPHEN 5-325 MG PO TABS
1.0000 | ORAL_TABLET | ORAL | Status: DC | PRN
  Administered 2020-02-06 – 2020-02-08 (×5): 2 via ORAL
  Filled 2020-02-05 (×5): qty 2

## 2020-02-05 MED ORDER — METHOTREXATE 2.5 MG PO TABS: 12.5000 mg | ORAL_TABLET | ORAL | Status: DC

## 2020-02-05 MED ORDER — SODIUM CHLORIDE 0.9 % IV SOLN
510.0000 mg | Freq: Once | INTRAVENOUS | Status: DC
  Filled 2020-02-05: qty 17

## 2020-02-05 MED ORDER — EPHEDRINE SULFATE-NACL 50-0.9 MG/10ML-% IV SOSY
PREFILLED_SYRINGE | INTRAVENOUS | Status: DC | PRN
  Administered 2020-02-05: 10 mg via INTRAVENOUS

## 2020-02-05 MED ORDER — ALENDRONATE SODIUM 70 MG PO TABS: 70.0000 mg | ORAL_TABLET | ORAL | Status: DC

## 2020-02-05 MED ORDER — ONDANSETRON HCL 4 MG PO TABS: 4.0000 mg | ORAL_TABLET | Freq: Four times a day (QID) | ORAL | Status: DC | PRN

## 2020-02-05 MED ORDER — MORPHINE SULFATE (PF) 4 MG/ML IV SOLN: 0.5000 mg | INTRAVENOUS | Status: DC | PRN

## 2020-02-05 SURGICAL SUPPLY — 51 items
BAG ZIPLOCK 12X15 (MISCELLANEOUS) ×3 IMPLANT
BIT DRILL 4.3MMS DISTAL GRDTED (BIT) ×1 IMPLANT
BNDG COHESIVE 4X5 TAN STRL (GAUZE/BANDAGES/DRESSINGS) ×6 IMPLANT
BNDG COHESIVE 6X5 TAN STRL LF (GAUZE/BANDAGES/DRESSINGS) ×3 IMPLANT
CLOSURE WOUND 1/2 X4 (GAUZE/BANDAGES/DRESSINGS) ×1
COVER MAYO STAND STRL (DRAPES) ×3 IMPLANT
COVER SURGICAL LIGHT HANDLE (MISCELLANEOUS) ×3 IMPLANT
COVER WAND RF STERILE (DRAPES) IMPLANT
DECANTER SPIKE VIAL GLASS SM (MISCELLANEOUS) ×3 IMPLANT
DRAPE INCISE IOBAN 66X45 STRL (DRAPES) ×3 IMPLANT
DRAPE STERI IOBAN 125X83 (DRAPES) ×3 IMPLANT
DRILL 4.3MMS DISTAL GRADUATED (BIT) ×3
DRSG AQUACEL AG ADV 3.5X 4 (GAUZE/BANDAGES/DRESSINGS) ×6 IMPLANT
DRSG MEPILEX BORDER 4X4 (GAUZE/BANDAGES/DRESSINGS) ×6 IMPLANT
DRSG MEPILEX BORDER 4X8 (GAUZE/BANDAGES/DRESSINGS) ×3 IMPLANT
DURAPREP 26ML APPLICATOR (WOUND CARE) ×3 IMPLANT
ELECT REM PT RETURN 15FT ADLT (MISCELLANEOUS) ×3 IMPLANT
FACESHIELD WRAPAROUND (MASK) ×3 IMPLANT
GLOVE BIO SURGEON STRL SZ7 (GLOVE) ×3 IMPLANT
GLOVE BIOGEL PI IND STRL 7.0 (GLOVE) ×1 IMPLANT
GLOVE BIOGEL PI IND STRL 8 (GLOVE) ×1 IMPLANT
GLOVE BIOGEL PI INDICATOR 7.0 (GLOVE) ×2
GLOVE BIOGEL PI INDICATOR 8 (GLOVE) ×2
GLOVE ORTHO TXT STRL SZ7.5 (GLOVE) ×3 IMPLANT
GOWN STRL REUS W/ TWL LRG LVL3 (GOWN DISPOSABLE) ×2 IMPLANT
GOWN STRL REUS W/TWL LRG LVL3 (GOWN DISPOSABLE) ×7 IMPLANT
GUIDEPIN 3.2X17.5 THRD DISP (PIN) ×6 IMPLANT
GUIDEWIRE BALL NOSE 80CM (WIRE) ×3 IMPLANT
KIT BASIN (CUSTOM PROCEDURE TRAY) ×3 IMPLANT
KIT TURNOVER KIT A (KITS) IMPLANT
NAIL HIP FRACTURE 11X380MM (Nail) ×3 IMPLANT
NEEDLE HYPO 22GX1.5 SAFETY (NEEDLE) ×3 IMPLANT
NS IRRIG 1000ML POUR BTL (IV SOLUTION) ×3 IMPLANT
PACK GENERAL/GYN (CUSTOM PROCEDURE TRAY) ×3 IMPLANT
PENCIL SMOKE EVACUATOR (MISCELLANEOUS) IMPLANT
PROTECTOR NERVE ULNAR (MISCELLANEOUS) ×3 IMPLANT
SCREW BONE CORTICAL 5.0X36 (Screw) ×3 IMPLANT
SCREW BONE CORTICAL 5.0X38 (Screw) ×3 IMPLANT
SCREW BONE CORTICAL 5.0X40 (Screw) ×3 IMPLANT
SCREW LAG HIP NAIL 10.5X95 (Screw) ×3 IMPLANT
SCREWDRIVER HEX TIP 3.5MM (MISCELLANEOUS) ×3 IMPLANT
STRIP CLOSURE SKIN 1/2X4 (GAUZE/BANDAGES/DRESSINGS) ×2 IMPLANT
SUT VIC AB 0 CT1 27 (SUTURE) ×2
SUT VIC AB 0 CT1 27XBRD ANTBC (SUTURE) ×1 IMPLANT
SUT VIC AB 2-0 CT2 27 (SUTURE) ×3 IMPLANT
SUT VIC AB 3-0 SH 8-18 (SUTURE) ×3 IMPLANT
SYR 20ML LL LF (SYRINGE) ×3 IMPLANT
TOWEL OR 17X26 10 PK STRL BLUE (TOWEL DISPOSABLE) ×3 IMPLANT
TOWEL OR NON WOVEN STRL DISP B (DISPOSABLE) ×3 IMPLANT
TRAY FOLEY MTR SLVR 16FR STAT (SET/KITS/TRAYS/PACK) IMPLANT
WATER STERILE IRR 1000ML POUR (IV SOLUTION) ×3 IMPLANT

## 2020-02-05 NOTE — Anesthesia Preprocedure Evaluation (Addendum)
Anesthesia Evaluation  Patient identified by MRN, date of birth, ID band Patient awake    Reviewed: Allergy & Precautions, NPO status , Patient's Chart, lab work & pertinent test results  Airway Mallampati: IV  TM Distance: <3 FB Neck ROM: Limited    Dental no notable dental hx.    Pulmonary neg pulmonary ROS,    Pulmonary exam normal breath sounds clear to auscultation       Cardiovascular hypertension, Normal cardiovascular exam Rhythm:Regular Rate:Normal     Neuro/Psych CP negative psych ROS   GI/Hepatic negative GI ROS, Neg liver ROS,   Endo/Other  negative endocrine ROS  Renal/GU negative Renal ROS  negative genitourinary   Musculoskeletal negative musculoskeletal ROS (+) Arthritis , Rheumatoid disorders,    Abdominal   Peds negative pediatric ROS (+)  Hematology  (+) anemia ,   Anesthesia Other Findings   Reproductive/Obstetrics negative OB ROS                            Anesthesia Physical Anesthesia Plan  ASA: III  Anesthesia Plan: General   Post-op Pain Management:    Induction: Intravenous  PONV Risk Score and Plan: 3 and Ondansetron, Dexamethasone and Treatment may vary due to age or medical condition  Airway Management Planned: Oral ETT and Video Laryngoscope Planned  Additional Equipment:   Intra-op Plan:   Post-operative Plan: Extubation in OR  Informed Consent: I have reviewed the patients History and Physical, chart, labs and discussed the procedure including the risks, benefits and alternatives for the proposed anesthesia with the patient or authorized representative who has indicated his/her understanding and acceptance.     Dental advisory given  Plan Discussed with: CRNA and Surgeon  Anesthesia Plan Comments:        Anesthesia Quick Evaluation

## 2020-02-05 NOTE — Transfer of Care (Signed)
Immediate Anesthesia Transfer of Care Note  Patient: Tara Brown  Procedure(s) Performed: RIGHT HIP, INTRAMEDULLARY NAIL FIXATION (Right )  Patient Location: PACU  Anesthesia Type:General  Level of Consciousness: awake and alert   Airway & Oxygen Therapy: Patient Spontanous Breathing and Patient connected to face mask oxygen  Post-op Assessment: Report given to RN and Post -op Vital signs reviewed and stable  Post vital signs: Reviewed and stable  Last Vitals:  Vitals Value Taken Time  BP    Temp    Pulse 74 02/05/20 1739  Resp 17 02/05/20 1739  SpO2 96 % 02/05/20 1739  Vitals shown include unvalidated device data.  Last Pain:  Vitals:   02/05/20 1200  TempSrc: Oral  PainSc:       Patients Stated Pain Goal: 4 (02/05/20 1151)  Complications: No apparent anesthesia complications

## 2020-02-05 NOTE — ED Notes (Signed)
Patient had an episode of urinary incontinence and bowel incontinence. New brief, new linens and CHG bath applied.

## 2020-02-05 NOTE — Anesthesia Postprocedure Evaluation (Signed)
Anesthesia Post Note  Patient: Tara Brown  Procedure(s) Performed: RIGHT HIP, INTRAMEDULLARY NAIL FIXATION (Right )     Patient location during evaluation: PACU Anesthesia Type: General Level of consciousness: awake and alert and oriented Pain management: pain level controlled Vital Signs Assessment: post-procedure vital signs reviewed and stable Respiratory status: spontaneous breathing, nonlabored ventilation and respiratory function stable Cardiovascular status: blood pressure returned to baseline Postop Assessment: no apparent nausea or vomiting Anesthetic complications: no Comments: See intraop anesthesia record. Patient placed on BiPAP in PACU for hypoxia and suspected hypercarbia with minimal responsiveness. After approximately 20 minutes on BiPAP, patient AAOx4, no complaints. Patient will be transferred to step-down unit for further monitoring. Stephannie Peters, MD                Kaylyn Layer

## 2020-02-05 NOTE — Progress Notes (Signed)
Pt is eating ham sandwitch now. Had pain med for pain @2117 , no distress noted.

## 2020-02-05 NOTE — Progress Notes (Signed)
PROGRESS NOTE  Denetra Formoso PYP:[id number] DOB: 02-Aug-1961 DOA: 02/04/2020 PCP: [name], [name]  HPI/Recap of past 24 hours: HPI from Dr Silas Flood Gumbs is a 59 y.o. female with medical history significant for rheumatoid arthritis with extensive scoliosis, major depressive disorder, hypertension, osteopenia, insomnia severe malnutrition presents to the hospital s/p fall, found down at home by health aide was on floor for over 12 hours.  Patient states she had a mechanical fall. She notes no loc or head trauma , but s/p fall unable to get up and note b/l hip pain and back pain. In the ED, VSS, labs stable from prior. Xray showed right subtrochanteric femur fracture, left periprosthetic acetabular fracture.  Orthopedics consulted.  Patient admitted for further management.    Today, patient reports pain around right hip, otherwise denies any new complaints. Denies any chest pain, SOB, abdominal pain, fever/chill.   Assessment/Plan: Active Problems:   Hip fx (HCC)   Right subtrochanteric femur fracture, left periprosthetic acetabular fracture Orthopedics on board, plan for surgery for right hip on 02/05/20 Of note patient has complicated history of recent repair of fracture on the left at [facility], orthopedics on board, plan to consult her orthopedic surgeon at [facility] Pain management, DVT ppx per orthopedics PT/OT s/p surgery  Iron def anemia/B12 deficiency Baseline hemoglobin around 8 Anemia panel showed iron 11, sats 4%, vitamin B12 198 We will give one dose of Feraheme on 02/05/2020, start vitamin B12 supplementation Daily CBC  Rheumatoid arthritis/extensive scoliosis/osteopenia Chronic debility , multiple fall with fracture Hold MTX per guidelines, continue pred, weekly Fosamax  Hypertension BP soft Hold home BP meds for now, lisinopril  Depression Continue home Lexapro, Wellbutrin  Severe malnutrition Dietitian consult placed        Malnutrition  Type:      Malnutrition Characteristics:      Nutrition Interventions:       Estimated body mass index is 17.79 kg/m as calculated from the following:   Height as of this encounter: 5\' 4"  (1.626 m).   Weight as of this encounter: 47 kg.     Code Status: Full  Family Communication: Discussed extensively with patient  Disposition Plan: Status is: Inpatient  Remains inpatient appropriate because:Inpatient level of care appropriate due to severity of illness   Dispo: The patient is from: Home              Anticipated d/c is to: SNF              Anticipated d/c date is: 2 days              Patient currently is not medically stable to d/c.    Consultants:  Orthopedics  Procedures:  None  Antimicrobials:  None  DVT prophylaxis: SCD pending surgery   Objective: Vitals:   02/05/20 1030 02/05/20 1130 02/05/20 1151 02/05/20 1200  BP: 104/68 107/61  102/65  Pulse: 81 76  77  Resp: (!) 24 18  18   Temp:    97.6 F (36.4 C)  TempSrc:    Oral  SpO2: 93% 93%  90%  Weight:   47 kg   Height:   5\' 4"  (1.626 m)     Intake/Output Summary (Last 24 hours) at 02/05/2020 1524 Last data filed at 02/05/2020 1513 Gross per 24 hour  Intake 350 ml  Output --  Net 350 ml   Filed Weights   02/04/20 1531 02/05/20 1151  Weight: 47 kg 47 kg    Exam:  General:  NAD, chronically ill-appearing, somewhat cachectic  Cardiovascular: S1, S2 present  Respiratory: CTAB  Abdomen: Soft, nontender, nondistended, bowel sounds present  Musculoskeletal: No bilateral pedal edema noted, + joint deformity upper and lower extremities with noted contractures  Skin: Normal  Psychiatry: Normal mood   Data Reviewed: CBC: Recent Labs  Lab 02/04/20 1601 02/05/20 0134 02/05/20 0442  WBC 10.3 7.6 8.0  NEUTROABS 8.0*  --   --   HGB 8.4* 7.1* 7.2*  HCT 27.4* 23.3* 23.6*  MCV 88.4 87.3 88.7  PLT 370 321 316   Basic Metabolic Panel: Recent Labs  Lab 02/04/20 1601  02/05/20 0134 02/05/20 0442  NA 133*  --  134*  K 3.9  --  4.0  CL 94*  --  96*  CO2 26  --  24  GLUCOSE 124*  --  96  BUN 19  --  19  CREATININE 0.78 0.85 0.89  CALCIUM 8.4*  --  7.8*   GFR: Estimated Creatinine Clearance: 49.9 mL/min (by C-G formula based on SCr of 0.89 mg/dL). Liver Function Tests: Recent Labs  Lab 02/04/20 1601  AST 19  ALT 12  ALKPHOS 87  BILITOT 0.3  PROT 7.6  ALBUMIN 3.5   No results for input(s): LIPASE, AMYLASE in the last 168 hours. No results for input(s): AMMONIA in the last 168 hours. Coagulation Profile: No results for input(s): INR, PROTIME in the last 168 hours. Cardiac Enzymes: Recent Labs  Lab 02/04/20 1601  CKTOTAL 410*   BNP (last 3 results) No results for input(s): PROBNP in the last 8760 hours. HbA1C: No results for input(s): HGBA1C in the last 72 hours. CBG: No results for input(s): GLUCAP in the last 168 hours. Lipid Profile: No results for input(s): CHOL, HDL, LDLCALC, TRIG, CHOLHDL, LDLDIRECT in the last 72 hours. Thyroid Function Tests: No results for input(s): TSH, T4TOTAL, FREET4, T3FREE, THYROIDAB in the last 72 hours. Anemia Panel: Recent Labs    02/05/20 1039  VITAMINB12 198  FOLATE 12.2  FERRITIN 63  TIBC 251  IRON 11*   Urine analysis:    Component Value Date/Time   COLORURINE AMBER (A) 08/07/2015 1020   APPEARANCEUR CLOUDY (A) 08/07/2015 1020   LABSPEC 1.021 08/07/2015 1020   PHURINE 6.0 08/07/2015 1020   GLUCOSEU NEGATIVE 08/07/2015 1020   HGBUR LARGE (A) 08/07/2015 1020   BILIRUBINUR MODERATE (A) 08/07/2015 1020   KETONESUR 15 (A) 08/07/2015 1020   PROTEINUR 100 (A) 08/07/2015 1020   UROBILINOGEN 2.0 (H) 03/07/2015 1832   NITRITE NEGATIVE 08/07/2015 1020   LEUKOCYTESUR LARGE (A) 08/07/2015 1020   Sepsis Labs: @LABRCNTIP (procalcitonin:4,lacticidven:4)  ) Recent Results (from the past 240 hour(s))  SARS Coronavirus 2 by RT PCR (hospital order, performed in [facility] hospital lab)  Nasopharyngeal Nasopharyngeal Swab     Status: None   Collection Time: 02/05/20 12:20 AM   Specimen: Nasopharyngeal Swab  Result Value Ref Range Status   SARS Coronavirus 2 NEGATIVE NEGATIVE Final    Comment: (NOTE) SARS-CoV-2 target nucleic acids are NOT DETECTED. The SARS-CoV-2 RNA is generally detectable in upper and lower respiratory specimens during the acute phase of infection. The lowest concentration of SARS-CoV-2 viral copies this assay can detect is 250 copies / mL. A negative result does not preclude SARS-CoV-2 infection and should not be used as the sole basis for treatment or other patient management decisions.  A negative result may occur with improper specimen collection / handling, submission of specimen other than nasopharyngeal swab, presence of viral mutation(s) within  the areas targeted by this assay, and inadequate number of viral copies (<250 copies / mL). A negative result must be combined with clinical observations, patient history, and epidemiological information. Fact Sheet for Patients:   StrictlyIdeas.no Fact Sheet for Healthcare Providers: BankingDealers.co.za This test is not yet approved or cleared  by the [place] FDA and has been authorized for detection and/or diagnosis of SARS-CoV-2 by FDA under an Emergency Use Authorization (EUA).  This EUA will remain in effect (meaning this test can be used) for the duration of the COVID-19 declaration under Section 564(b)(1) of the Act, 21 U.S.C. section 360bbb-3(b)(1), unless the authorization is terminated or revoked sooner. Performed at [facility], [place] [address] [place], [place] [address]       Studies: DG Chest 2 View  Result Date: 02/04/2020 CLINICAL DATA:  Fall EXAM: CHEST - 2 VIEW COMPARISON:  01/08/2020 FINDINGS: Examination is limited by severe scoliosis. Lateral view limited by patient's arms and positioning. No gross  pleural effusion. Linear atelectasis at the left base. Stable cardiomediastinal silhouette. Old appearing bilateral rib fractures. No gross pneumothorax allowing for positioning and arc technique. Chronic deformities of the proximal humeri. IMPRESSION: Limited by positioning and technique. Subsegmental atelectasis or scar at the left base. Electronically Signed   By: Donavan Foil M.D.   On: 02/04/2020 18:15   DG Knee 1-2 Views Right  Result Date: 02/04/2020 CLINICAL DATA:  Fall with pain EXAM: RIGHT KNEE - 1-2 VIEW COMPARISON:  None. FINDINGS: Prior knee replacement with intact hardware and normal alignment. Bones appear osteopenic. No acute displaced fracture or malalignment. No large knee effusion. Diffuse soft tissue atrophy. Possible chronic fracture deformity of the proximal shaft of the fibula IMPRESSION: Right knee replacement without acute osseous abnormality Electronically Signed   By: Donavan Foil M.D.   On: 02/04/2020 18:28   CT ABDOMEN PELVIS W CONTRAST  Result Date: 02/04/2020 CLINICAL DATA:  [place], found down, right hip and pelvic pain EXAM: CT ABDOMEN AND PELVIS WITH CONTRAST TECHNIQUE: Multidetector CT imaging of the abdomen and pelvis was performed using the standard protocol following bolus administration of intravenous contrast. CONTRAST:  61mL OMNIPAQUE IOHEXOL 300 MG/ML  SOLN COMPARISON:  08/17/2010, 02/04/2020 FINDINGS: Lower chest: Hypoventilatory changes are seen at the left lung base. No acute pleural or parenchymal lung disease. Hepatobiliary: Liver compatible with hemangioma 2.6 cm hypodensity right lobe. No intrahepatic duct dilation. The gallbladder is unremarkable. Pancreas: Unremarkable. No pancreatic ductal dilatation or surrounding inflammatory changes. Spleen: Normal in size without focal abnormality. Adrenals/Urinary Tract: There is bilateral renal cortical atrophy, with small bilateral renal cortical cysts. No urinary tract calculi or obstructive uropathy. Bladder is  decompressed. The adrenals are unremarkable. Stomach/Bowel: There is a large amount of stool within the rectal vault consistent with fecal impaction. No bowel obstruction or ileus. No bowel wall thickening or inflammatory change. Vascular/Lymphatic: Aortic atherosclerosis. No enlarged abdominal or pelvic lymph nodes. Reproductive: Uterus and bilateral adnexa are unremarkable. Other: No free fluid or free gas.  No abdominal wall hernia. Musculoskeletal: There is a mildly comminuted left iliac bone fracture, extending through the left acetabular roof. Fracture line extends along the superolateral margin of the acetabular component of the left hip arthroplasty. There is a markedly comminuted and impacted inter trochanteric proximal right femur fracture. Postsurgical changes right iliac crest. Prior healed bilateral pubic rami fractures. Reconstructed images demonstrate no additional findings. IMPRESSION: 1. Comminuted proximal right femoral intertrochanteric fracture. 2. Comminuted fracture left iliac bone, involving the superior lateral aspect  of the femoral component of the left hip arthroplasty. 3. Fecal impaction. Electronically Signed   By: Sharlet Salina M.D.   On: 02/04/2020 20:26   CT L-SPINE NO CHARGE  Result Date: 02/04/2020 CLINICAL DATA:  Larey Seat, hip fracture EXAM: CT LUMBAR SPINE WITHOUT CONTRAST TECHNIQUE: Multidetector CT imaging of the lumbar spine was performed without intravenous contrast administration. Multiplanar CT image reconstructions were also generated. COMPARISON:  04/24/2019 FINDINGS: Segmentation: 5 non-rib-bearing lumbar type vertebral bodies. Alignment: Mild left convex curvature centered at the thoracolumbar junction. Otherwise alignment is anatomic. Vertebrae: No acute displaced fractures. Paraspinal and other soft tissues: Significant retained stool within the rectum. Paraspinal soft tissues are unremarkable. Disc levels: There is prominent spondylosis at T11/T12, without significant  compressive sequela. Disc spaces of the lumbar spine are well preserved. IMPRESSION: 1. No acute displaced lumbar spine fracture. Electronically Signed   By: Sharlet Salina M.D.   On: 02/04/2020 20:29   DG Hips Bilat W or Wo Pelvis 5 Views  Result Date: 02/04/2020 CLINICAL DATA:  Larey Seat at night EXAM: DG HIP (WITH OR WITHOUT PELVIS) 5+V BILAT COMPARISON:  12/05/2019, 12/03/2019, 11/09/2016 FINDINGS: Previous surgical plate and multiple screw fixation of the right iliac bone with multiple screw fracture and screw fragments. Acute comminuted fracture involving the right trochanter and subtrochanteric femur with varus angulation the distal fracture fragment and less than 1/4 shaft diameter displacement of distal fragment toward the midline. The femoral head on the right is normally position. Prior left hip replacement without dislocation. Surgical mesh and multiple wire fragments are again visualized. Amorphous material caudal to the femoral stem without change. Acute mildly displaced fracture through the left iliac bone, extending from the crest to the lateral aspect of the acetabulum. Chronic appearing fracture deformities of the bilateral pubic rami. IMPRESSION: 1. Prior left hip replacement without dislocation. Acute mildly displaced fracture within the left iliac bone extending from the crest to the superolateral acetabular margin. 2. Acute mildly comminuted, displaced and angulated fracture involving the proximal right femur, involves the trochanter and subtrochanteric femur. Electronically Signed   By: Jasmine Pang M.D.   On: 02/04/2020 18:27    Scheduled Meds: . sodium chloride   Intravenous Once  . acetaminophen  1,000 mg Oral Once  . [MAR Hold] buPROPion  150 mg Oral Daily  . chlorhexidine  60 mL Topical Once  . [MAR Hold] escitalopram  20 mg Oral Daily  . [MAR Hold] predniSONE  5 mg Oral Daily    Continuous Infusions: . sodium chloride 75 mL/hr at 02/05/20 0407  . ceFAZolin    .  ceFAZolin  (ANCEF) IV    . lactated ringers 50 mL/hr at 02/05/20 1359  . tranexamic acid       LOS: 0 days     Briant Cedar, MD [facility]  If 7PM-7AM, please contact night-coverage [email or web address] 02/05/2020, 3:24 PM

## 2020-02-05 NOTE — Anesthesia Procedure Notes (Signed)
Date/Time: 02/05/2020 5:18 PM Performed by: Minerva Ends, CRNA Oxygen Delivery Method: Simple face mask Placement Confirmation: positive ETCO2 and breath sounds checked- equal and bilateral Dental Injury: Teeth and Oropharynx as per pre-operative assessment

## 2020-02-05 NOTE — Discharge Instructions (Signed)
Diet: As you were doing prior to hospitalization   Shower:  May shower but keep the wounds dry, use an occlusive plastic wrap, NO SOAKING IN TUB.  If the bandage gets wet, change with a clean dry gauze.  If you have a splint on, leave the splint in place and keep the splint dry with a plastic bag.  Dressing:  You may change your dressing 3-5 days after surgery, unless you have a splint.  If you have a splint, then just leave the splint in place and we will change your bandages during your first follow-up appointment.    If you had hand or foot surgery, we will plan to remove your stitches in about 2 weeks in the office.  For all other surgeries, there are sticky tapes (steri-strips) on your wounds and all the stitches are absorbable.  Leave the steri-strips in place when changing your dressings, they will peel off with time, usually 2-3 weeks.  Activity:  Increase activity slowly as tolerated, but follow the weight bearing instructions below.  The rules on driving is that you can not be taking narcotics while you drive, and you must feel in control of the vehicle.    Weight Bearing:   Weight bearing as tolerated - right leg. Touch toe weight bearing - left leg.    To prevent constipation: you may use a stool softener such as -  Colace (over the counter) 100 mg by mouth twice a day  Drink plenty of fluids (prune juice may be helpful) and high fiber foods Miralax (over the counter) for constipation as needed.    Itching:  If you experience itching with your medications, try taking only a single pain pill, or even half a pain pill at a time.  You may take up to 10 pain pills per day, and you can also use benadryl over the counter for itching or also to help with sleep.   Precautions:  If you experience chest pain or shortness of breath - call 911 immediately for transfer to the hospital emergency department!!  If you develop a fever greater that 101 F, purulent drainage from wound, increased  redness or drainage from wound, or calf pain -- Call the office at [phone]                                                Follow- Up Appointment:  Please call for an appointment to be seen in 2 weeks [place] - [phone]

## 2020-02-05 NOTE — Op Note (Signed)
DATE OF SURGERY:  02/05/2020  TIME: 4:38 PM  PATIENT NAME:  Tara Brown  AGE: 59 y.o.  PRE-OPERATIVE DIAGNOSIS: Right subtrochanteric femur fracture  POST-OPERATIVE DIAGNOSIS:  SAME  PROCEDURE: Right hip intramedullary nail fixation  SURGEON:  Johnny Bridge  ASSISTANT:  Merlene Pulling, [name], present and scrubbed throughout the case, critical for assistance with exposure, retraction, instrumentation, and closure.  OPERATIVE IMPLANTS: Biomet Affixus size 380 x 11 femoral nail with a cephalomedullary lag screw for the femoral head, and two distal interlocking bolts.  UNIQUE ASPECTS OF THE CASE: The bone quality was exceedingly poor.  The proximal femur was opened with almost no instrumentation.  After placing my first 2 screws distally, I exchange them to be 2 mm longer to make sure that I had bicortical hold.  The proximal interlocking bolt felt already secured even before I turned it, I am not sure why, but it seemed to be solid with the lag screw.  ESTIMATED BLOOD LOSS: 100 mL  Blood products: 2 units of packed red blood cells  PREOPERATIVE INDICATIONS:  Tara Brown is a 59 y.o. year old who fell and suffered a hip fracture. She was brought into the ER and then admitted and optimized and then elected for surgical intervention.  She has end-stage rheumatoid arthritis, and also had a left periarticular acetabular fracture, with a recent constrained left total hip replacement.  The risks benefits and alternatives were discussed with the patient including but not limited to the risks of nonoperative treatment, versus surgical intervention including infection, bleeding, nerve injury, malunion, nonunion, hardware prominence, hardware failure, need for hardware removal, blood clots, cardiopulmonary complications, morbidity, mortality, among others, and they were willing to proceed.    OPERATIVE PROCEDURE:  The patient was brought to the operating room and placed in the supine  position.  General anesthesia was administered. She was placed on the Hana table.  Closed reduction was performed under C-arm guidance.  Time out was then performed after sterile prep and drape. She received preoperative antibiotics.  I also examined the right hip to make sure that the prosthesis was still located correctly with fluoroscopy.  Incision was made proximal to the greater trochanter. A guidewire was placed in the appropriate position.  This was introduced completely by hand, and had almost no bone to secure it with approximately.  Confirmation was made on AP and lateral views.  The above-named nail was opened. I opened the proximal femur with a reamer. I then placed the nail by hand down. I did not need to ream the femur.  Once the nail was completely seated, I placed a guidepin into the femoral head into the center center position. I measured the length, and then reamed the lateral cortex and up into the head. I then placed the cephalomedullary screw. Anatomic fixation achieved. Bone quality was exceedingly poor.  I then secured the proximal interlocking bolt, and locked the nail distally using perfect circles, a total of 2 distal screws.  I took final C-arm pictures AP and lateral.   Anatomic reconstruction was achieved, and the wounds were irrigated copiously and closed with Vicryl followed by Steri-Strips and sterile gauze for the skin. The patient was awakened and returned to PACU in stable and satisfactory condition. There were no complications and the patient tolerated the procedure well.  She will be weightbearing as tolerated on the right lower extremity, and touch toe weightbearing on the left lower extremity, and will be on Lovenox  for a period of four  weeks after discharge.   Teryl Lucy, M.D.

## 2020-02-05 NOTE — Anesthesia Procedure Notes (Signed)
Procedure Name: Intubation Date/Time: 02/05/2020 2:44 PM Performed by: Minerva Ends, CRNA Pre-anesthesia Checklist: Patient identified, Emergency Drugs available, Suction available and Patient being monitored Patient Re-evaluated:Patient Re-evaluated prior to induction Oxygen Delivery Method: Circle System Utilized Preoxygenation: Pre-oxygenation with 100% oxygen Induction Type: IV induction Ventilation: Mask ventilation without difficulty Laryngoscope Size: Glidescope and 3 Grade View: Grade I Tube type: Oral Number of attempts: 1 Airway Equipment and Method: Stylet and Oral airway Placement Confirmation: ETT inserted through vocal cords under direct vision,  positive ETCO2 and breath sounds checked- equal and bilateral Secured at: 22 cm Tube secured with: Tape Dental Injury: Teeth and Oropharynx as per pre-operative assessment  Difficulty Due To: Difficulty was anticipated, Difficult Airway- due to reduced neck mobility, Difficult Airway- due to limited oral opening and Difficult Airway- due to dentition Future Recommendations: Recommend- induction with short-acting agent, and alternative techniques readily available Comments: DIFFICULT INTUBATION-- smooth IV induction Rose-- Glidescope placed by AM CRNA-- good view of cords-- teeth limit insertion of ETT with rigid stylet-- ETT with rigid stylet placed and advanced by Rose-- pt with poor dentition-- missing and many chipped teeth-- unchanged with intubation -- bilat BS Rose

## 2020-02-05 NOTE — Progress Notes (Signed)
Pt seen at approximately 1920 in PACU wearing bipap.  Per bedside RN, pt is more awake and MD asked that bipap be removed.  Pt removed from bipap and placed on 2lnc.  Pt tolerated well at that time, HR84, rr17, spo2 97%.  RN aware, present at bedside.  Bipap remains there on standby if needed.

## 2020-02-05 NOTE — Consult Note (Signed)
ORTHOPAEDIC CONSULTATION  REQUESTING PHYSICIAN: Alma Friendly, MD  Chief Complaint: Bilateral hip pain, fall   HPI: Tara Brown is a 59 y.o. female who complains of bilateral hip pain after a fall.  She is normally capable of ambulating, however she has severe rheumatoid arthritis.  She recently had a revision surgery at [facility] for a recurrent left hip instability.  She fell at home, with acute onset severe pain, bilateral hips.  She was apparently found down, for somewhere between 12 and 24 hours.  Pain is rated as severe, better with pain medications.  Worse with movement.  Past Medical History:  Diagnosis Date  . Anemia   . Arthritis   . Major depressive disorder, recurrent, severe without psychotic features ([place]) 03/11/2015  . Osteoporosis    Past Surgical History:  Procedure Laterality Date  . AMPUTATION Left 12/07/2015   Procedure: AMPUTATION LEFT GREAT TOE;  Surgeon: Newt Minion, MD;  Location: [facility];  Service: Orthopedics;  Laterality: Left;  . FOOT SURGERY    . HAND SURGERY    . HIP FRACTURE SURGERY    . KNEE SURGERY    . NECK SURGERY     Social History   Socioeconomic History  . Marital status: Divorced    Spouse name: Not on file  . Number of children: Not on file  . Years of education: Not on file  . Highest education level: Not on file  Occupational History  . Not on file  Tobacco Use  . Smoking status: Never Smoker  . Smokeless tobacco: Never Used  Substance and Sexual Activity  . Alcohol use: No  . Drug use: No  . Sexual activity: Never    Birth control/protection: None  Other Topics Concern  . Not on file  Social History Narrative  . Not on file   Social Determinants of Health   Financial Resource Strain:   . Difficulty of Paying Living Expenses:   Food Insecurity:   . Worried About Charity fundraiser in the Last Year:   . Arboriculturist in the Last Year:   Transportation Needs:   . Film/video editor (Medical):    Marland Kitchen Lack of Transportation (Non-Medical):   Physical Activity:   . Days of Exercise per Week:   . Minutes of Exercise per Session:   Stress:   . Feeling of Stress :   Social Connections:   . Frequency of Communication with Friends and Family:   . Frequency of Social Gatherings with Friends and Family:   . Attends Religious Services:   . Active Member of Clubs or Organizations:   . Attends Archivist Meetings:   Marland Kitchen Marital Status:    Family History  Problem Relation Age of Onset  . Bipolar disorder Sister   . Suicidality Other   . Schizophrenia Other   . Stroke Neg Hx    No Known Allergies   Positive ROS: All other systems have been reviewed and were otherwise negative with the exception of those mentioned in the HPI and as above.  Physical Exam: General: Patient is extremely frail appearing, cachectic, in no acute distress Cardiovascular: No pedal edema Respiratory: No cyanosis, no use of accessory musculature GI: No organomegaly, abdomen is soft and non-tender Skin: She has a well-healed surgical wound over the right iliac wing, and no evidence for open fracture over the right hip. Neurologic: Sensation intact distally Psychiatric: Patient is competent for consent with normal mood and affect Lymphatic:  No axillary or cervical lymphadenopathy  MUSCULOSKELETAL: Her cervical motion is extremely restricted, she can only really look to the left.  She has severe deformity in both upper extremities with loss of position of the carpus, as well as the phalanges.  Her right lower extremity EHL and FHL are intact as is the case on the left, but any movement of either of the extremities causes severe pain.  Assessment: Right subtrochanteric femur fracture Left periprosthetic acetabular fracture, extending from the ileum just adjacent to a recently revised constrained prosthesis for recurrent instability End-stage rheumatoid arthritis with severe deformity throughout the  musculoskeletal system  Plan: This is an acute severe complicated situation, and carries risks for long-term morbidity as well as mortality, and even in the short-term.  Her bone quality is extremely poor, and is almost nonapparent on the x-rays.  I recommended right hip intramedullary nail fixation, she has a prosthetic knee below as well.  The left hip I think can be treated nonsurgically, but I may consult with her primary orthopedic surgeon who performed her revision joint surgery, or alternatively some of our trauma specialist or our total joint specialists.  The first order of business is to stabilize her right hip, risks benefits and alternatives been discussed at length, including not limited the risks of infection, bleeding, malunion, nonunion, blood clots, hardware failure, the need for future revision surgery, inability to regain ambulatory function, morbidity and mortality, among others.    Eulas Post, MD Cell [phone]   02/05/2020 11:42 AM

## 2020-02-06 ENCOUNTER — Inpatient Hospital Stay (HOSPITAL_COMMUNITY): Payer: Medicare HMO

## 2020-02-06 ENCOUNTER — Encounter: Payer: Self-pay | Admitting: *Deleted

## 2020-02-06 DIAGNOSIS — L8990 Pressure ulcer of unspecified site, unspecified stage: Secondary | ICD-10-CM | POA: Insufficient documentation

## 2020-02-06 DIAGNOSIS — E43 Unspecified severe protein-calorie malnutrition: Secondary | ICD-10-CM | POA: Insufficient documentation

## 2020-02-06 LAB — CBC WITH DIFFERENTIAL/PLATELET
Abs Immature Granulocytes: 0.03 10*3/uL (ref 0.00–0.07)
Basophils Absolute: 0 10*3/uL (ref 0.0–0.1)
Basophils Relative: 0 %
Eosinophils Absolute: 0 10*3/uL (ref 0.0–0.5)
Eosinophils Relative: 0 %
HCT: 29.2 % — ABNORMAL LOW (ref 36.0–46.0)
Hemoglobin: 8.9 g/dL — ABNORMAL LOW (ref 12.0–15.0)
Immature Granulocytes: 0 %
Lymphocytes Relative: 4 %
Lymphs Abs: 0.3 10*3/uL — ABNORMAL LOW (ref 0.7–4.0)
MCH: 27.8 pg (ref 26.0–34.0)
MCHC: 30.5 g/dL (ref 30.0–36.0)
MCV: 91.3 fL (ref 80.0–100.0)
Monocytes Absolute: 0.4 10*3/uL (ref 0.1–1.0)
Monocytes Relative: 5 %
Neutro Abs: 7.6 10*3/uL (ref 1.7–7.7)
Neutrophils Relative %: 91 %
Platelets: 240 10*3/uL (ref 150–400)
RBC: 3.2 MIL/uL — ABNORMAL LOW (ref 3.87–5.11)
RDW: 16.9 % — ABNORMAL HIGH (ref 11.5–15.5)
WBC: 8.4 10*3/uL (ref 4.0–10.5)
nRBC: 0 % (ref 0.0–0.2)

## 2020-02-06 LAB — BASIC METABOLIC PANEL
Anion gap: 10 (ref 5–15)
BUN: 24 mg/dL — ABNORMAL HIGH (ref 6–20)
CO2: 24 mmol/L (ref 22–32)
Calcium: 7.3 mg/dL — ABNORMAL LOW (ref 8.9–10.3)
Chloride: 100 mmol/L (ref 98–111)
Creatinine, Ser: 1.12 mg/dL — ABNORMAL HIGH (ref 0.44–1.00)
GFR calc Af Amer: >60 mL/min (ref >60)
GFR calc non Af Amer: 53 mL/min — ABNORMAL LOW (ref >60)
Glucose, Bld: 224 mg/dL — ABNORMAL HIGH (ref 70–99)
Potassium: 4.7 mmol/L (ref 3.5–5.1)
Sodium: 134 mmol/L — ABNORMAL LOW (ref 135–145)

## 2020-02-06 LAB — MRSA PCR SCREENING: MRSA by PCR: NEGATIVE

## 2020-02-06 MED ORDER — ORAL CARE MOUTH RINSE
15.0000 mL | Freq: Two times a day (BID) | OROMUCOSAL | Status: DC
  Administered 2020-02-06 – 2020-02-18 (×10): 15 mL via OROMUCOSAL

## 2020-02-06 MED ORDER — CHLORHEXIDINE GLUCONATE 0.12 % MT SOLN
15.0000 mL | Freq: Two times a day (BID) | OROMUCOSAL | Status: DC
  Administered 2020-02-06 – 2020-02-18 (×16): 15 mL via OROMUCOSAL
  Filled 2020-02-06 (×16): qty 15

## 2020-02-06 MED ORDER — FERROUS SULFATE 325 (65 FE) MG PO TABS
325.0000 mg | ORAL_TABLET | Freq: Every day | ORAL | Status: DC
  Administered 2020-02-09 – 2020-02-15 (×5): 325 mg via ORAL
  Filled 2020-02-06 (×9): qty 1

## 2020-02-06 MED ORDER — ORAL CARE MOUTH RINSE
15.0000 mL | Freq: Two times a day (BID) | OROMUCOSAL | Status: DC
  Administered 2020-02-06 – 2020-02-07 (×3): 15 mL via OROMUCOSAL

## 2020-02-06 MED ORDER — SODIUM CHLORIDE 0.9 % IV SOLN: INTRAVENOUS | Status: DC

## 2020-02-06 MED ORDER — CHLORHEXIDINE GLUCONATE CLOTH 2 % EX PADS
6.0000 | MEDICATED_PAD | Freq: Every day | CUTANEOUS | Status: DC
  Administered 2020-02-06 – 2020-02-18 (×13): 6 via TOPICAL

## 2020-02-06 MED ORDER — ENSURE ENLIVE PO LIQD
237.0000 mL | Freq: Three times a day (TID) | ORAL | Status: DC
  Administered 2020-02-06 – 2020-02-18 (×26): 237 mL via ORAL

## 2020-02-06 NOTE — Progress Notes (Signed)
Received report from Kim at 15:25. Pt arrived on unit at 16:45 via bed transfer. C/o pain 8/10 generalized- see flow sheet for full assessment. Oriented to unit and room. Right hip dressing intact with old drainage. Hip abductor pillow applied. Foley in place without urine in bag. Per Selena Batten MD notified prior to transfer regarding low urinary output. Order for IVF implemented on arrival to unit. Placed on Tele, SR 80s, no ectopy noted. No s/s of distress. Will continue to monitor patient closely. Erle Crocker, RN

## 2020-02-06 NOTE — Progress Notes (Signed)
Pt has not been void, tried purewick, unable to use it, Bladder scan done, recorded, called Dr. Dion Saucier on call service for F- cath orders, while awaiting call back, transferred to 1235 step down bed,  Report given @ bedside, IVF and blood total over without urine out put. The RN will call [facility] for f-cath orders.

## 2020-02-06 NOTE — Progress Notes (Signed)
Transition of Care (TOC) -30 day Note       Patient Details  Name: Tara Brown  MRN: [id number] Date of Birth: [birth year]   Transition of Care [facility]) CM/SW Contact  Name: Elliot Gault Phone Number: [phone] Date: 02/06/2020 Time: 1531   MUST ID: [id number]   To Whom it May Concern:   Please be advised that the above patient will require a short-term nursing home stay, anticipated 30 days or less rehabilitation and strengthening. The plan is for return home.

## 2020-02-06 NOTE — Progress Notes (Signed)
Foley insertion unsuccessful.  Patient stated they tried to place on prior to surgery and in and out catheter after surgery unsuccessfully as well.  Paging MD to update and will re-bladder scan patient prior to shift ending.

## 2020-02-06 NOTE — Progress Notes (Signed)
Initial Nutrition Assessment  DOCUMENTATION CODES:   Severe malnutrition in context of chronic illness, Underweight  INTERVENTION:  - will order Ensure Enlive TID, each supplement provides 350 kcal and 20 grams of protein - will order vanilla [place] yogurt for 2000 snack each night, per patient request.  NUTRITION DIAGNOSIS:   Severe Malnutrition related to chronic illness(RA and scoliosis) as evidenced by moderate fat depletion, moderate muscle depletion, severe fat depletion, severe muscle depletion.  GOAL:   Patient will meet greater than or equal to 90% of their needs  MONITOR:   PO intake, Supplement acceptance, Labs, Weight trends  REASON FOR ASSESSMENT:   Consult Assessment of nutrition requirement/status  ASSESSMENT:   59 y.o. female with medical history of rheumatoid arthritis, extensive scoliosis, major depressive disorder, HTN, osteopenia, insomnia, and severe malnutrition. She presented to the ED after a fall at home; she was on the floor for >12 hours.  Per flow sheet documentation, she consumed 50% of dinner last night. Visualized breakfast tray with ~50% completion. Patient requests strawberry Ensure TID and vanilla Greek yogurt for a nightly snack.   She states good appetite at baseline but that until recently she had little to no help at home and had extensive difficulty accessing food or preparing items. Patient has extensive arthritis in both hands which makes tasks pertaining to cooking and feeding more challenging and time and energy intensive. Patient now has Monday-Friday help in her home and aide does her grocery shopping and cooks for her.   She states that when she comes to the hospital she tries to eat as best as she can. Prior to receiving help at home, she often felt too weak and tired to complete ADLs. She is unable to reach arms above her head so she was unable to shower. Patient shares that this led to depression.   Per chart review, weight today is  92 lb and PTA the most recently documented weight was on 02/17/17 when she weighed 110 lb.    Labs reviewed; Na: 134 mmol/l, BUN: 24 mg/dl, creatinine: 1.44 mg/dl, Ca: 7.3 mg/dl, GFR: 53 ml/min. Medications reviewed; 5 mg deltasone/day, 1 tablet senokot BID, 1000 mcg oral cyanocobalamin/day.     NUTRITION - FOCUSED PHYSICAL EXAM:    Most Recent Value  Orbital Region  Moderate depletion  Upper Arm Region  Moderate depletion  Thoracic and Lumbar Region  Severe depletion  Buccal Region  Moderate depletion  Temple Region  Moderate depletion  Clavicle Bone Region  Severe depletion  Clavicle and Acromion Bone Region  Severe depletion  Scapular Bone Region  Unable to assess  Dorsal Hand  Severe depletion  Patellar Region  Moderate depletion  Anterior Thigh Region  Unable to assess  Posterior Calf Region  Severe depletion  Edema (RD Assessment)  None  Hair  Reviewed  Eyes  Reviewed  Mouth  Reviewed  Skin  Reviewed  Nails  Reviewed       Diet Order:   Diet Order            Diet regular Room service appropriate? Yes; Fluid consistency: Thin  Diet effective now              EDUCATION NEEDS:   No education needs have been identified at this time  Skin:  Skin Assessment: Skin Integrity Issues: Skin Integrity Issues:: Stage I, Incisions Stage I: sacrum Incisions: R hip (5/27)  Last BM:  5/28  Height:   Ht Readings from Last 1 Encounters:  02/06/20 5'  4" (1.626 m)    Weight:   Wt Readings from Last 1 Encounters:  02/06/20 41.7 kg    Estimated Nutritional Needs:  Kcal:  1600-1800 kcal Protein:  75-85 grams Fluid:  >/= 1.8 L/day     Jarome Matin, MS, RD, LDN, CNSC Inpatient Clinical Dietitian RD pager # available in AMION  After hours/weekend pager # available in [facility]

## 2020-02-06 NOTE — Progress Notes (Signed)
PROGRESS NOTE  Tara Brown AYT:[id number] DOB: [birth year] DOA: 02/04/2020 PCP: Iva Lento, [name]  HPI/Recap of past 24 hours: HPI from Dr Alroy Bailiff Landeck is a 59 y.o. female with medical history significant for rheumatoid arthritis with extensive scoliosis, major depressive disorder, hypertension, osteopenia, insomnia severe malnutrition presents to the hospital s/p fall, found down at home by health aide was on floor for over 12 hours.  Patient states she had a mechanical fall. She notes no loc or head trauma , but s/p fall unable to get up and note b/l hip pain and back pain. In the ED, VSS, labs stable from prior. Xray showed right subtrochanteric femur fracture, left periprosthetic acetabular fracture.  Orthopedics consulted.  Patient admitted for further management.    Overnight, postop patient noted to desaturate, requiring BiPAP very briefly, transferred to SDU for closer monitoring.  Early this a.m., also noted to have acute urinary retention with very difficult Foley placement requiring urology, draining about 800 cc of urine.  Today, patient denies any new complaints, met patient enjoying her breakfast, reports controlled postop pain.   Assessment/Plan: Active Problems:   Hip fx (HCC)   Closed right hip fracture (HCC)   Pressure injury of skin   Protein-calorie malnutrition, severe   Right subtrochanteric femur fracture, left periprosthetic acetabular fracture s/p right hip intramedullary nail fixation on 02/05/2020 by Dr Mardelle Matte Orthopedics on board Of note patient has complicated history of recent repair of fracture on the left at [facility], orthopedics on board, plan to consult her orthopedic surgeon at [facility] Pain management, DVT ppx per orthopedics PT/OT s/p surgery  Acute urinary retention Difficult Foley placement by urology, draining about 800 cc of urine on 02/06/2020 Notified of poor urine output by RN Start IV fluids, continue Foley  Acute hypoxic  respiratory failure Improved, no longer requiring BiPAP Likely 2/2 postop complication, ?  Atelectasis Chest x-ray unremarkable Supplemental O2, plan to wean off, incentive spirometry  ABLA/Iron def anemia/B12 deficiency Baseline hemoglobin around 8 Received 2 units of blood on 02/05/2020 Anemia panel showed iron 11, sats 4%, vitamin B12 198 S/P one dose of Feraheme on 02/05/2020, start vitamin B12 and oral iron supplementation Daily CBC  Rheumatoid arthritis/extensive scoliosis/osteopenia Chronic debility , multiple fall with fracture Hold MTX per guidelines, continue pred, weekly Fosamax  Hypertension BP soft Hold home BP meds for now, lisinopril IVF as above  Depression Continue home Lexapro, Wellbutrin  Severe malnutrition Dietitian consult placed        Malnutrition Type:  Nutrition Problem: Severe Malnutrition Etiology: chronic illness(RA and scoliosis)   Malnutrition Characteristics:  Signs/Symptoms: moderate fat depletion, moderate muscle depletion, severe fat depletion, severe muscle depletion   Nutrition Interventions:  Interventions: Ensure Enlive (each supplement provides 350kcal and 20 grams of protein), Snacks    Estimated body mass index is 15.78 kg/m as calculated from the following:   Height as of this encounter: _0  (1.626 m).   Weight as of this encounter: 41.7 kg.     Code Status: Full  Family Communication: Discussed extensively with patient  Disposition Plan: Status is: Inpatient  Remains inpatient appropriate because:Inpatient level of care appropriate due to severity of illness   Dispo: The patient is from: Home              Anticipated d/c is to: SNF              Anticipated d/c date is: 2 days  Patient currently is not medically stable to d/c.    Consultants:  Orthopedics  Procedures:  None  Antimicrobials:  None  DVT prophylaxis: SCD due to low hemoglobin, defer to orthopedics to  start   Objective: Vitals:   02/06/20 1300 02/06/20 1400 02/06/20 1500 02/06/20 1645  BP: (!) 110/44 115/67 122/60 93/72  Pulse: 72 75 76 71  Resp: 19 (!) 23 (!) 23 20  Temp:    99.2 F (37.3 C)  TempSrc:    Oral  SpO2: 92% 94% 96% 98%  Weight:      Height:        Intake/Output Summary (Last 24 hours) at 02/06/2020 1653 Last data filed at 02/06/2020 1530 Gross per 24 hour  Intake 2081.17 ml  Output 900 ml  Net 1181.17 ml   Filed Weights   02/04/20 1531 02/05/20 1151 02/06/20 0246  Weight: 47 kg 47 kg 41.7 kg    Exam:  General: NAD, chronically ill-appearing, somewhat cachectic  Cardiovascular: S1, S2 present  Respiratory: CTAB  Abdomen: Soft, nontender, nondistended, bowel sounds present  Musculoskeletal: No bilateral pedal edema noted, + joint deformity upper and lower extremities with noted contractures, right hip dressing C/D/I  Skin: Normal  Psychiatry: Normal mood   Data Reviewed: CBC: Recent Labs  Lab 02/04/20 1601 02/05/20 0134 02/05/20 0442 02/06/20 0242  WBC 10.3 7.6 8.0 8.4  NEUTROABS 8.0*  --   --  7.6  HGB 8.4* 7.1* 7.2* 8.9*  HCT 27.4* 23.3* 23.6* 29.2*  MCV 88.4 87.3 88.7 91.3  PLT 370 321 316 403   Basic Metabolic Panel: Recent Labs  Lab 02/04/20 1601 02/05/20 0134 02/05/20 0442 02/06/20 0242  NA 133*  --  134* 134*  K 3.9  --  4.0 4.7  CL 94*  --  96* 100  CO2 26  --  24 24  GLUCOSE 124*  --  96 224*  BUN 19  --  19 24*  CREATININE 0.78 0.85 0.89 1.12*  CALCIUM 8.4*  --  7.8* 7.3*   GFR: Estimated Creatinine Clearance: 35.2 mL/min (A) (by C-G formula based on SCr of 1.12 mg/dL (H)). Liver Function Tests: Recent Labs  Lab 02/04/20 1601  AST 19  ALT 12  ALKPHOS 87  BILITOT 0.3  PROT 7.6  ALBUMIN 3.5   No results for input(s): LIPASE, AMYLASE in the last 168 hours. No results for input(s): AMMONIA in the last 168 hours. Coagulation Profile: No results for input(s): INR, PROTIME in the last 168 hours. Cardiac  Enzymes: Recent Labs  Lab 02/04/20 1601  CKTOTAL 410*   BNP (last 3 results) No results for input(s): PROBNP in the last 8760 hours. HbA1C: No results for input(s): HGBA1C in the last 72 hours. CBG: No results for input(s): GLUCAP in the last 168 hours. Lipid Profile: No results for input(s): CHOL, HDL, LDLCALC, TRIG, CHOLHDL, LDLDIRECT in the last 72 hours. Thyroid Function Tests: No results for input(s): TSH, T4TOTAL, FREET4, T3FREE, THYROIDAB in the last 72 hours. Anemia Panel: Recent Labs    02/05/20 1039  VITAMINB12 198  FOLATE 12.2  FERRITIN 63  TIBC 251  IRON 11*   Urine analysis:    Component Value Date/Time   COLORURINE AMBER (A) 08/07/2015 1020   APPEARANCEUR CLOUDY (A) 08/07/2015 1020   LABSPEC 1.021 08/07/2015 1020   PHURINE 6.0 08/07/2015 1020   GLUCOSEU NEGATIVE 08/07/2015 1020   HGBUR LARGE (A) 08/07/2015 1020   BILIRUBINUR MODERATE (A) 08/07/2015 1020   KETONESUR 15 (A) 08/07/2015  1020   PROTEINUR 100 (A) 08/07/2015 1020   UROBILINOGEN 2.0 (H) 03/07/2015 1832   NITRITE NEGATIVE 08/07/2015 1020   LEUKOCYTESUR LARGE (A) 08/07/2015 1020   Sepsis Labs: _0 (procalcitonin:4,lacticidven:4)  ) Recent Results (from the past 240 hour(s))  SARS Coronavirus 2 by RT PCR (hospital order, performed in [facility] hospital lab) Nasopharyngeal Nasopharyngeal Swab     Status: None   Collection Time: 02/05/20 12:20 AM   Specimen: Nasopharyngeal Swab  Result Value Ref Range Status   SARS Coronavirus 2 NEGATIVE NEGATIVE Final    Comment: (NOTE) SARS-CoV-2 target nucleic acids are NOT DETECTED. The SARS-CoV-2 RNA is generally detectable in upper and lower respiratory specimens during the acute phase of infection. The lowest concentration of SARS-CoV-2 viral copies this assay can detect is 250 copies / mL. A negative result does not preclude SARS-CoV-2 infection and should not be used as the sole basis for treatment or other patient management decisions.  A  negative result may occur with improper specimen collection / handling, submission of specimen other than nasopharyngeal swab, presence of viral mutation(s) within the areas targeted by this assay, and inadequate number of viral copies (<250 copies / mL). A negative result must be combined with clinical observations, patient history, and epidemiological information. Fact Sheet for Patients:   StrictlyIdeas.no Fact Sheet for Healthcare Providers: BankingDealers.co.za This test is not yet approved or cleared  by the [place] FDA and has been authorized for detection and/or diagnosis of SARS-CoV-2 by FDA under an Emergency Use Authorization (EUA).  This EUA will remain in effect (meaning this test can be used) for the duration of the COVID-19 declaration under Section 564(b)(1) of the Act, 21 U.S.C. section 360bbb-3(b)(1), unless the authorization is terminated or revoked sooner. Performed at [facility], [place] [address], [place], [place] [address]   MRSA PCR Screening     Status: None   Collection Time: 02/06/20  2:12 AM   Specimen: Nasal Mucosa; Nasopharyngeal  Result Value Ref Range Status   MRSA by PCR NEGATIVE NEGATIVE Final    Comment:        The GeneXpert MRSA Assay (FDA approved for NASAL specimens only), is one component of a comprehensive MRSA colonization surveillance program. It is not intended to diagnose MRSA infection nor to guide or monitor treatment for MRSA infections. Performed at [facility], [place] [address], [place], [place] [address]       Studies: Pelvis Portable  Result Date: 02/05/2020 CLINICAL DATA:  Status post right intramedullary rod fixation. EXAM: PORTABLE PELVIS 1-2 VIEWS COMPARISON:  Jan 11, 2020. FINDINGS: Status post left total hip arthroplasty. Status post intramedullary rod fixation of mildly displaced subtrochanteric fracture of the proximal right  femur. Expected postoperative changes are noted in the soft tissues. IMPRESSION: Status post intramedullary rod fixation of mildly displaced subtrochanteric fracture of proximal right femur. Electronically Signed   By: Marijo Conception M.D.   On: 02/05/2020 19:04   DG CHEST PORT 1 VIEW  Result Date: 02/06/2020 CLINICAL DATA:  Hypoxia EXAM: PORTABLE CHEST 1 VIEW COMPARISON:  02/04/2020 FINDINGS: Unchanged severe thoracic scoliosis. No focal airspace consolidation. Cardiomediastinal contours are normal. IMPRESSION: Unchanged examination of the chest without acute airspace disease. Electronically Signed   By: Ulyses Jarred M.D.   On: 02/06/2020 02:45   DG Hip Port Unilat With Pelvis 1V Right  Result Date: 02/05/2020 CLINICAL DATA:  Post right IM nail EXAM: DG HIP (WITH OR WITHOUT PELVIS) 1V PORT RIGHT COMPARISON:  02/05/2020, 02/04/2020 FINDINGS: Only a single view is submitted. Previous left hip replacement with numerous wire fragments and surgical mesh. Interval intramedullary rodding of the right femur for comminuted intertrochanteric fracture. Gas in the soft tissues consistent with recent surgery IMPRESSION: Interval intramedullary rodding of right femur for comminuted intertrochanteric fracture Electronically Signed   By: Donavan Foil M.D.   On: 02/05/2020 19:05    Scheduled Meds: . sodium chloride   Intravenous Once  . acetaminophen  500 mg Oral Q6H  . buPROPion  150 mg Oral Daily  . chlorhexidine  15 mL Mouth Rinse BID  . Chlorhexidine Gluconate Cloth  6 each Topical Daily  . docusate sodium  100 mg Oral BID  . escitalopram  20 mg Oral Daily  . feeding supplement (ENSURE ENLIVE)  237 mL Oral TID BM  . ketorolac  7.5 mg Intravenous Q6H  . mouth rinse  15 mL Mouth Rinse q12n4p  . mouth rinse  15 mL Mouth Rinse BID  . predniSONE  5 mg Oral Daily  . senna  1 tablet Oral BID  . vitamin B-12  1,000 mcg Oral Daily    Continuous Infusions: . sodium chloride       LOS: 1 day      Alma Friendly, MD [facility]  If 7PM-7AM, please contact night-coverage [email or web address] 02/06/2020, 4:53 PM

## 2020-02-06 NOTE — TOC Initial Note (Addendum)
Transition of Care [facility]) - Initial/Assessment Note    Patient Details  Name: Tara Brown MRN: [id number] Date of Birth: [birth year]  Transition of Care [facility]) CM/SW Contact:    Shade Flood, LCSW Phone Number: 02/06/2020, 2:16 PM  Clinical Narrative:                  Pt admitted from home with hip fx. Received consult indicating pt will need SNF at dc. Met with pt to assess. Per pt, she has recently been at [facility] in [place] and she would like to return there.   Will start referral and pt will need new PASRR as her number from the last SNF stay has expired. Pt will also need insurance auth before she can dc.   TOC will follow.   1500: Spoke with Misty at [facility] who states that they will offer pt a bed, however, pt has not had 60 days of wellness since her previous dc from their facility so she will be in copay days and they will require 14-20 days of payment up front at $184 per day. Met with pt again to update. She states that she cannot afford to do that. She asks for referrals to other facilities with the understanding that they may have the same requirement. Pt states that if there is not a facility that will take her without upfront payment, she may just have to go home with [facility]. Pt currently gets two hours of PCS services from [facility].  Referred out to additional facilities. TOC will follow.  Expected Discharge Plan: Skilled Nursing Facility Barriers to Discharge: Continued Medical Work up   Patient Goals and CMS Choice Patient states their goals for this hospitalization and ongoing recovery are:: get better CMS Medicare.gov Compare Post Acute Care list provided to:: Patient Choice offered to / list presented to : Patient  Expected Discharge Plan and Services Expected Discharge Plan: [place] In-house Referral: Clinical Social Work   Post Acute Care Choice: [place] Living arrangements for the past 2 months: Apartment                                       Prior Living Arrangements/Services Living arrangements for the past 2 months: Apartment Lives with:: Self Patient language and need for interpreter reviewed:: Yes Do you feel safe going back to the place where you live?: Yes      Need for Family Participation in Patient Care: No (Comment) Care giver support system in place?: Yes (comment) Current home services: DME, Other (comment)(PCS) Criminal Activity/Legal Involvement Pertinent to Current Situation/Hospitalization: No - Comment as needed  Activities of Daily Living Home Assistive Devices/Equipment: Environmental consultant (specify type), Wheelchair, Shower chair with back, Bedside commode/3-in-1(front wheeled walker) ADL Screening (condition at time of admission) Patient's cognitive ability adequate to safely complete daily activities?: Yes Is the patient deaf or have difficulty hearing?: No Does the patient have difficulty seeing, even when wearing glasses/contacts?: No Does the patient have difficulty concentrating, remembering, or making decisions?: No Patient able to express need for assistance with ADLs?: Yes Does the patient have difficulty dressing or bathing?: Yes Independently performs ADLs?: No Communication: Independent Dressing (OT): Needs assistance Is this a change from baseline?: Pre-admission baseline Grooming: Needs assistance Is this a change from baseline?: Pre-admission baseline Feeding: Needs assistance Is this a change from baseline?: Pre-admission baseline Bathing: Needs assistance Is this a change from baseline?:  Pre-admission baseline Toileting: Dependent Is this a change from baseline?: Change from baseline, expected to last >3days In/Out Bed: Dependent Is this a change from baseline?: Change from baseline, expected to last >3 days Walks in Home: Dependent Is this a change from baseline?: Change from baseline, expected to last >3 days Does the patient have difficulty  walking or climbing stairs?: Yes Weakness of Legs: Right Weakness of Arms/Hands: Both  Permission Sought/Granted Permission sought to share information with : Chartered certified accountant granted to share information with : Yes, Verbal Permission Granted     Permission granted to share info w AGENCY: [facility]        Emotional Assessment Appearance:: Appears stated age Attitude/Demeanor/Rapport: Engaged Affect (typically observed): Pleasant Orientation: : Oriented to Self, Oriented to Place, Oriented to  Time, Oriented to Situation Alcohol / Substance Use: Not Applicable Psych Involvement: No (comment)  Admission diagnosis:  Hip fx ([place]) [S72.009A] Fall [W19.XXXA] Fall, initial encounter [id number]] Closed fracture of right hip, initial encounter ([place]) [S72.001A] Closed fracture of left iliac crest, initial encounter ([place]) [S32.302A] Closed nondisplaced fracture of left acetabulum, unspecified portion of acetabulum, initial encounter ([place]) [S32.402A] Closed right hip fracture ([place]) [S72.001A] Patient Active Problem List   Diagnosis Date Noted  . Pressure injury of skin 02/06/2020  . Hip fx ([place]) 02/05/2020  . Closed right hip fracture ([place]) 02/05/2020  . MDD (major depressive disorder) 02/17/2017  . Osteoporosis 05/29/2016  . HTN (hypertension) 05/29/2016  . MDD (major depressive disorder), recurrent severe, without psychosis ([place]) 05/28/2016  . Rheumatoid arthritis ([place]) 12/06/2015  . Anemia of chronic disease 03/08/2015   PCP:  Iva Lento, [name] Pharmacy:   [facility], [place] [address] Phone: [phone] Fax: [facility] #[id number] - [place], [place] - [address] AT [place] [address] [place] [address] Phone: [phone] Fax: [phone]     Social Determinants of Health (SDOH)  Interventions    Readmission Risk Interventions No flowsheet data found.

## 2020-02-06 NOTE — Progress Notes (Signed)
Orthopedic Tech Progress Note Patient Details:  Millicent Blazejewski May 10, 1961 [id number]  Ortho Devices Type of Ortho Device: Abduction pillow Ortho Device/Splint Location: Hip Abduction Ortho Device/Splint Interventions: Application   Post Interventions Patient Tolerated: Well Instructions Provided: Care of device   AVORY RAHIMI 02/06/2020, 11:29 AM

## 2020-02-06 NOTE — Evaluation (Signed)
Occupational Therapy Evaluation Patient Details Name: Tara Brown MRN: [id number] DOB: 1960-12-14 Today's Date: 02/06/2020    History of Present Illness Patient is a 59 year old female with PMH significant for scoliosis, RA, multiple L hip sx's due to dislocations of THA, toe amputation admitted after a fall. Patient is s/p R hip IM nailing, pt also has L periarthicular acetabular fracture being treated conservatively.    Clinical Impression   Patient with functional deficits listed below impacting safety and independence with self care. Patient require mod A x2 for bed mobility due to pain, decreased strength, activity tolerance, balance. Upon sitting up at edge of bed patient reports feeling nauseous and dizzy, tolerate for approx 30 seconds before needing to return to supine. Will continue to follow.    Follow Up Recommendations  SNF    Equipment Recommendations  Other (comment)(TBD)       Precautions / Restrictions Precautions Precautions: Fall Precaution Comments: hip abduction pillow for comfort Restrictions Weight Bearing Restrictions: Yes RLE Weight Bearing: Weight bearing as tolerated LLE Weight Bearing: Touchdown weight bearing      Mobility Bed Mobility Overal bed mobility: Needs Assistance Bed Mobility: Supine to Sit;Sit to Supine     Supine to sit: Mod assist;+2 for physical assistance Sit to supine: Mod assist;+2 for physical assistance   General bed mobility comments: patient require assist to advance R LE towards edge of bed and to elevate trunk. assist to lift LEs and guide trunk back to bed  Transfers                 General transfer comment: deferred due to nausea    Balance Overall balance assessment: Needs assistance Sitting-balance support: Bilateral upper extremity supported;Feet unsupported Sitting balance-Leahy Scale: Poor                                     ADL either performed or assessed with clinical judgement    ADL Overall ADL's : Needs assistance/impaired Eating/Feeding: Set up;Bed level   Grooming: Set up;Bed level   Upper Body Bathing: Moderate assistance;Bed level;Maximal assistance   Lower Body Bathing: Total assistance;Bed level   Upper Body Dressing : Moderate assistance;Bed level;Maximal assistance   Lower Body Dressing: Total assistance;Bed level   Toilet Transfer: +2 for physical assistance;+2 for safety/equipment Toilet Transfer Details (indicate cue type and reason): anticipate assist x2, require x2 assist to sit up at edge of bed however patient feeling nauseous/dizzy and unable to tolerate transfer today Toileting- Clothing Manipulation and Hygiene: Total assistance;Bed level       Functional mobility during ADLs: Moderate assistance;+2 for physical assistance General ADL Comments: patient requiring increased assistance with self care due to pain, weakness, decreased activity tolerance, balance                  Pertinent Vitals/Pain Pain Assessment: Faces Faces Pain Scale: Hurts even more Pain Location: R hip Pain Descriptors / Indicators: Discomfort;Grimacing Pain Intervention(s): Premedicated before session;Monitored during session     Hand Dominance     Extremity/Trunk Assessment Upper Extremity Assessment Upper Extremity Assessment: Generalized weakness   Lower Extremity Assessment Lower Extremity Assessment: Defer to PT evaluation   Cervical / Trunk Assessment Cervical / Trunk Assessment: Other exceptions Cervical / Trunk Exceptions: scoliosis   Communication Communication Communication: No difficulties   Cognition Arousal/Alertness: Awake/alert Behavior During Therapy: WFL for tasks assessed/performed Overall Cognitive Status: Within Functional Limits for tasks  assessed                                     General Comments  patient BP semi-supine in bed 117/80, attempted to get seated EOB however patient too nauseous             Home Living Family/patient expects to be discharged to:: Skilled nursing facility                                        Prior Functioning/Environment Level of Independence: Needs assistance  Gait / Transfers Assistance Needed: patient reports ambulating at home without AD prior to hospitalization ADL's / Homemaking Assistance Needed: patient has [place] aide 2 hrs a day M-F to assist "as I need them" such as bathing, dressing, meal prep, assist with transportation            OT Problem List: Decreased strength;Decreased activity tolerance;Impaired balance (sitting and/or standing);Decreased safety awareness;Pain      OT Treatment/Interventions: Self-care/ADL training;Therapeutic exercise;DME and/or AE instruction;Therapeutic activities;Patient/family education;Balance training    OT Goals(Current goals can be found in the care plan section) Acute Rehab OT Goals Patient Stated Goal: go to rehab OT Goal Formulation: With patient Time For Goal Achievement: 02/20/20 Potential to Achieve Goals: Good  OT Frequency: Min 2X/week           Co-evaluation PT/OT/SLP Co-Evaluation/Treatment: Yes Reason for Co-Treatment: Complexity of the patient's impairments (multi-system involvement);For patient/therapist safety;To address functional/ADL transfers PT goals addressed during session: Mobility/safety with mobility OT goals addressed during session: ADL's and self-care      AM-PAC OT "6 Clicks" Daily Activity     Outcome Measure Help from another person eating meals?: A Little Help from another person taking care of personal grooming?: A Little Help from another person toileting, which includes using toliet, bedpan, or urinal?: Total Help from another person bathing (including washing, rinsing, drying)?: A Lot Help from another person to put on and taking off regular upper body clothing?: A Lot Help from another person to put on and taking off regular lower body  clothing?: Total 6 Click Score: 12   End of Session Nurse Communication: Mobility status;Weight bearing status  Activity Tolerance: Patient limited by pain;Treatment limited secondary to medical complications (Comment)(nausea) Patient left: in bed;with call bell/phone within reach  OT Visit Diagnosis: Other abnormalities of gait and mobility (R26.89);Muscle weakness (generalized) (M62.81);History of falling (Z91.81);Pain Pain - Right/Left: (bilateral) Pain - part of body: Hip                Time: 1207-1225 OT Time Calculation (min): 18 min Charges:  OT General Charges $OT Visit: 1 Visit OT Evaluation $OT Eval Moderate Complexity: [place] OT Pager: [place] 02/06/2020, 1:43 PM

## 2020-02-06 NOTE — Consult Note (Signed)
Urology Consult  Referring physician: Dr. Maisie Fus Reason for referral: difficult foley placement  Chief Complaint: Urinary retention  History of Present Illness: Tara Brown is a 59yo with a hx of osteoporosis who was admitted after right hip fracture and repair. She has been unable to urinate since surgery. She has severe introital stenosis and multiple attempts were made by the nursing staff to place a foley which were unsuccessful. Currently the patient has moderate to severe supapubic pain.   Past Medical History:  Diagnosis Date  . Anemia   . Arthritis   . 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 Osteoporosis    Past Surgical History:  Procedure Laterality Date  . AMPUTATION Left 12/07/2015   Procedure: AMPUTATION LEFT GREAT TOE;  Surgeon: Nadara Mustard, MD;  Location: [facility];  Service: Orthopedics;  Laterality: Left;  . FOOT SURGERY    . HAND SURGERY    . HIP FRACTURE SURGERY    . INTRAMEDULLARY (IM) NAIL INTERTROCHANTERIC Right 02/05/2020   Procedure: RIGHT HIP, INTRAMEDULLARY NAIL FIXATION;  Surgeon: Teryl Lucy, MD;  Location: [facility];  Service: Orthopedics;  Laterality: Right;  . KNEE SURGERY    . NECK SURGERY      Medications: I have reviewed the patient's current medications. Allergies: No Known Allergies  Family History  Problem Relation Age of Onset  . Bipolar disorder Sister   . Suicidality Other   . Schizophrenia Other   . Stroke Neg Hx    Social History:  reports that she has never smoked. She has never used smokeless tobacco. She reports that she does not drink alcohol or use drugs.  Review of Systems  Genitourinary: Positive for difficulty urinating.  Musculoskeletal: Positive for back pain.  All other systems reviewed and are negative.   Physical Exam:  Vital signs in last 24 hours: Temp:  [97.3 F (36.3 C)-99.2 F (37.3 C)] 99.2 F (37.3 C) (05/28 1645) Pulse Rate:  [66-103] 71 (05/28 1645) Resp:   [13-25] 20 (05/28 1645) BP: (74-133)/(34-82) 93/72 (05/28 1645) SpO2:  [88 %-100 %] 98 % (05/28 1645) Weight:  [41.7 kg] 41.7 kg (05/28 0246) Physical Exam  Constitutional: She is oriented to person, place, and time. She appears well-developed. She appears cachectic.  HENT:  Head: Normocephalic and atraumatic.  Eyes: Pupils are equal, round, and reactive to light. EOM are normal.  Neck: No thyromegaly present.  Cardiovascular: Normal rate and regular rhythm.  Respiratory: Effort normal. No respiratory distress.  GI: Soft. She exhibits distension.  Musculoskeletal:        General: No edema.  Neurological: She is alert and oriented to person, place, and time.  Skin: Skin is warm and dry.  Psychiatric: She has a normal mood and affect. Her behavior is normal. Judgment and thought content normal.    Laboratory Data:  Results for orders placed or performed during the hospital encounter of 02/04/20 (from the past 72 hour(s))  CBC with Differential     Status: Abnormal   Collection Time: 02/04/20  4:01 PM  Result Value Ref Range   WBC 10.3 4.0 - 10.5 K/uL   RBC 3.10 (L) 3.87 - 5.11 MIL/uL   Hemoglobin 8.4 (L) 12.0 - 15.0 g/dL   HCT 15.9 (L) 45.8 - 59.2 %   MCV 88.4 80.0 - 100.0 fL   MCH 27.1 26.0 - 34.0 pg   MCHC 30.7 30.0 - 36.0 g/dL   RDW 92.4 (H) 46.2 - 86.3 %   Platelets  370 150 - 400 K/uL   nRBC 0.0 0.0 - 0.2 %   Neutrophils Relative % 79 %   Neutro Abs 8.0 (H) 1.7 - 7.7 K/uL   Lymphocytes Relative 12 %   Lymphs Abs 1.2 0.7 - 4.0 K/uL   Monocytes Relative 9 %   Monocytes Absolute 0.9 0.1 - 1.0 K/uL   Eosinophils Relative 0 %   Eosinophils Absolute 0.0 0.0 - 0.5 K/uL   Basophils Relative 0 %   Basophils Absolute 0.0 0.0 - 0.1 K/uL   Immature Granulocytes 0 %   Abs Immature Granulocytes 0.03 0.00 - 0.07 K/uL    Comment: Performed at [facility], [address] [place], [place] [address]  Comprehensive metabolic panel     Status: Abnormal   Collection  Time: 02/04/20  4:01 PM  Result Value Ref Range   Sodium 133 (L) 135 - 145 mmol/L   Potassium 3.9 3.5 - 5.1 mmol/L   Chloride 94 (L) 98 - 111 mmol/L   CO2 26 22 - 32 mmol/L   Glucose, Bld 124 (H) 70 - 99 mg/dL    Comment: Glucose reference range applies only to samples taken after fasting for at least 8 hours.   BUN 19 6 - 20 mg/dL   Creatinine, Ser 2.62 0.44 - 1.00 mg/dL   Calcium 8.4 (L) 8.9 - 10.3 mg/dL   Total Protein 7.6 6.5 - 8.1 g/dL   Albumin 3.5 3.5 - 5.0 g/dL   AST 19 15 - 41 U/L   ALT 12 0 - 44 U/L   Alkaline Phosphatase 87 38 - 126 U/L   Total Bilirubin 0.3 0.3 - 1.2 mg/dL   GFR calc non Af Amer >60 >60 mL/min   GFR calc Af Amer >60 >60 mL/min   Anion gap 13 5 - 15    Comment: Performed at [facility], [address] [place], [place] [address]  CK     Status: Abnormal   Collection Time: 02/04/20  4:01 PM  Result Value Ref Range   Total CK 410 (H) 38 - 234 U/L    Comment: Performed at [facility], [address] [place], [place] [address]  SARS Coronavirus 2 by RT PCR (hospital order, performed in [facility] hospital lab) Nasopharyngeal Nasopharyngeal Swab     Status: None   Collection Time: 02/05/20 12:20 AM   Specimen: Nasopharyngeal Swab  Result Value Ref Range   SARS Coronavirus 2 NEGATIVE NEGATIVE    Comment: (NOTE) SARS-CoV-2 target nucleic acids are NOT DETECTED. The SARS-CoV-2 RNA is generally detectable in upper and lower respiratory specimens during the acute phase of infection. The lowest concentration of SARS-CoV-2 viral copies this assay can detect is 250 copies / mL. A negative result does not preclude SARS-CoV-2 infection and should not be used as the sole basis for treatment or other patient management decisions.  A negative result may occur with improper specimen collection / handling, submission of specimen other than nasopharyngeal swab, presence of viral mutation(s) within the areas targeted by this  assay, and inadequate number of viral copies (<250 copies / mL). A negative result must be combined with clinical observations, patient history, and epidemiological information. Fact Sheet for Patients:   BoilerBrush.com.cy Fact Sheet for Healthcare Providers: https://pope.com/ This test is not yet approved or cleared  by the [place] FDA and has been authorized for detection and/or diagnosis of SARS-CoV-2 by FDA under an Emergency Use Authorization (EUA).  This EUA  will remain in effect (meaning this test can be used) for the duration of the COVID-19 declaration under Section 564(b)(1) of the Act, 21 U.S.C. section 360bbb-3(b)(1), unless the authorization is terminated or revoked sooner. Performed at [facility], [address] [place], [place] [address]   HIV Antibody (routine testing w rflx)     Status: None   Collection Time: 02/05/20  1:34 AM  Result Value Ref Range   HIV Screen 4th Generation wRfx Non Reactive Non Reactive    Comment: Performed at [facility], [address], [place], [place] [address]  CBC     Status: Abnormal   Collection Time: 02/05/20  1:34 AM  Result Value Ref Range   WBC 7.6 4.0 - 10.5 K/uL   RBC 2.67 (L) 3.87 - 5.11 MIL/uL   Hemoglobin 7.1 (L) 12.0 - 15.0 g/dL   HCT 09.8 (L) 11.9 - 14.7 %   MCV 87.3 80.0 - 100.0 fL   MCH 26.6 26.0 - 34.0 pg   MCHC 30.5 30.0 - 36.0 g/dL   RDW 82.9 (H) 56.2 - 13.0 %   Platelets 321 150 - 400 K/uL   nRBC 0.0 0.0 - 0.2 %    Comment: Performed at [facility], [address] [place], [place] [address]  Creatinine, serum     Status: None   Collection Time: 02/05/20  1:34 AM  Result Value Ref Range   Creatinine, Ser 0.85 0.44 - 1.00 mg/dL   GFR calc non Af Amer >60 >60 mL/min   GFR calc Af Amer >60 >60 mL/min    Comment: Performed at [facility], [address], [place], [place] [address]  Type and  screen [facility]     Status: None (Preliminary result)   Collection Time: 02/05/20  1:34 AM  Result Value Ref Range   ABO/RH(D) O POS    Antibody Screen NEG    Sample Expiration 02/08/2020,2359    Unit Number [id number]    Blood Component Type RED CELLS,LR    Unit division 00    Status of Unit ISSUED,FINAL    Transfusion Status OK TO TRANSFUSE    Crossmatch Result      Compatible Performed at [facility], [address] [place], [place] [address]    Unit Number [id number]    Blood Component Type RED CELLS,LR    Unit division 00    Status of Unit ISSUED,FINAL    Transfusion Status OK TO TRANSFUSE    Crossmatch Result Compatible    Unit Number [id number]    Blood Component Type RED CELLS,LR    Unit division 00    Status of Unit ALLOCATED    Transfusion Status OK TO TRANSFUSE    Crossmatch Result Compatible    Unit Number [id number]    Blood Component Type RED CELLS,LR    Unit division 00    Status of Unit ALLOCATED    Transfusion Status OK TO TRANSFUSE    Crossmatch Result Compatible   CBC     Status: Abnormal   Collection Time: 02/05/20  4:42 AM  Result Value Ref Range   WBC 8.0 4.0 - 10.5 K/uL   RBC 2.66 (L) 3.87 - 5.11 MIL/uL   Hemoglobin 7.2 (L) 12.0 - 15.0 g/dL   HCT 10.9 (L) 32.3 - 55.7 %   MCV 88.7 80.0 - 100.0 fL   MCH 27.1 26.0 - 34.0 pg   MCHC 30.5 30.0 -  36.0 g/dL   RDW 18.6 (H) 11.5 - 15.5 %   Platelets 316 150 - 400 K/uL   nRBC 0.0 0.0 - 0.2 %    Comment: Performed at [facility], [place] [address], [place], [place] [address]  Basic metabolic panel     Status: Abnormal   Collection Time: 02/05/20  4:42 AM  Result Value Ref Range   Sodium 134 (L) 135 - 145 mmol/L   Potassium 4.0 3.5 - 5.1 mmol/L   Chloride 96 (L) 98 - 111 mmol/L   CO2 24 22 - 32 mmol/L   Glucose, Bld 96 70 - 99 mg/dL    Comment: Glucose reference range applies only to samples taken after fasting for at least 8  hours.   BUN 19 6 - 20 mg/dL   Creatinine, Ser 0.89 0.44 - 1.00 mg/dL   Calcium 7.8 (L) 8.9 - 10.3 mg/dL   GFR calc non Af Amer >60 >60 mL/min   GFR calc Af Amer >60 >60 mL/min   Anion gap 14 5 - 15    Comment: Performed at [facility], [place] [address] [place], [place] [address]  Ferritin     Status: None   Collection Time: 02/05/20 10:39 AM  Result Value Ref Range   Ferritin 63 11 - 307 ng/mL    Comment: Performed at [facility], [place] [address], [place], [place] [address]  Folate     Status: None   Collection Time: 02/05/20 10:39 AM  Result Value Ref Range   Folate 12.2 >5.9 ng/mL    Comment: Performed at [facility], [place] [address], [place], [place] [address]  Iron and TIBC     Status: Abnormal   Collection Time: 02/05/20 10:39 AM  Result Value Ref Range   Iron 11 (L) 28 - 170 ug/dL   TIBC 251 250 - 450 ug/dL   Saturation Ratios 4 (L) 10.4 - 31.8 %   UIBC 240 ug/dL    Comment: Performed at [facility], [place] [address] [place], [place] [address]  Vitamin B12     Status: None   Collection Time: 02/05/20 10:39 AM  Result Value Ref Range   Vitamin B-12 198 180 - 914 pg/mL    Comment: (NOTE) This assay is not validated for testing neonatal or myeloproliferative syndrome specimens for Vitamin B12 levels. Performed at [facility], [place] [address] [place], [place] [address]   Prepare RBC (crossmatch)     Status: None   Collection Time: 02/05/20  1:57 PM  Result Value Ref Range   Order Confirmation      ORDER PROCESSED BY BLOOD BANK Performed at [place] [address] [place], [place] [address]   MRSA PCR Screening     Status: None   Collection Time: 02/06/20  2:12 AM   Specimen: Nasal Mucosa; Nasopharyngeal  Result Value Ref Range   MRSA by PCR NEGATIVE NEGATIVE    Comment:        The GeneXpert MRSA Assay (FDA approved for NASAL specimens only), is  one component of a comprehensive MRSA colonization surveillance program. It is not intended to diagnose MRSA infection nor to guide or monitor treatment for MRSA infections. Performed at [facility], [place] [address], [place],  [address]   CBC with Differential/Platelet     Status: Abnormal   Collection Time: 02/06/20  2:42 AM  Result Value Ref Range   WBC  8.4 4.0 - 10.5 K/uL   RBC 3.20 (L) 3.87 - 5.11 MIL/uL   Hemoglobin 8.9 (L) 12.0 - 15.0 g/dL   HCT 54.0 (L) 08.6 - 76.1 %   MCV 91.3 80.0 - 100.0 fL   MCH 27.8 26.0 - 34.0 pg   MCHC 30.5 30.0 - 36.0 g/dL   RDW 95.0 (H) 93.2 - 67.1 %   Platelets 240 150 - 400 K/uL   nRBC 0.0 0.0 - 0.2 %   Neutrophils Relative % 91 %   Neutro Abs 7.6 1.7 - 7.7 K/uL   Lymphocytes Relative 4 %   Lymphs Abs 0.3 (L) 0.7 - 4.0 K/uL   Monocytes Relative 5 %   Monocytes Absolute 0.4 0.1 - 1.0 K/uL   Eosinophils Relative 0 %   Eosinophils Absolute 0.0 0.0 - 0.5 K/uL   Basophils Relative 0 %   Basophils Absolute 0.0 0.0 - 0.1 K/uL   Immature Granulocytes 0 %   Abs Immature Granulocytes 0.03 0.00 - 0.07 K/uL    Comment: Performed at [facility], [address], [place], [place] [address]  Basic metabolic panel     Status: Abnormal   Collection Time: 02/06/20  2:42 AM  Result Value Ref Range   Sodium 134 (L) 135 - 145 mmol/L   Potassium 4.7 3.5 - 5.1 mmol/L   Chloride 100 98 - 111 mmol/L   CO2 24 22 - 32 mmol/L   Glucose, Bld 224 (H) 70 - 99 mg/dL    Comment: Glucose reference range applies only to samples taken after fasting for at least 8 hours.   BUN 24 (H) 6 - 20 mg/dL   Creatinine, Ser 9.98 (H) 0.44 - 1.00 mg/dL   Calcium 7.3 (L) 8.9 - 10.3 mg/dL   GFR calc non Af Amer 53 (L) >60 mL/min   GFR calc Af Amer >60 >60 mL/min   Anion gap 10 5 - 15    Comment: Performed at [facility], [address] [place], [place] [address]   Recent Results (from the past 240 hour(s))  SARS  Coronavirus 2 by RT PCR (hospital order, performed in [facility] hospital lab) Nasopharyngeal Nasopharyngeal Swab     Status: None   Collection Time: 02/05/20 12:20 AM   Specimen: Nasopharyngeal Swab  Result Value Ref Range Status   SARS Coronavirus 2 NEGATIVE NEGATIVE Final    Comment: (NOTE) SARS-CoV-2 target nucleic acids are NOT DETECTED. The SARS-CoV-2 RNA is generally detectable in upper and lower respiratory specimens during the acute phase of infection. The lowest concentration of SARS-CoV-2 viral copies this assay can detect is 250 copies / mL. A negative result does not preclude SARS-CoV-2 infection and should not be used as the sole basis for treatment or other patient management decisions.  A negative result may occur with improper specimen collection / handling, submission of specimen other than nasopharyngeal swab, presence of viral mutation(s) within the areas targeted by this assay, and inadequate number of viral copies (<250 copies / mL). A negative result must be combined with clinical observations, patient history, and epidemiological information. Fact Sheet for Patients:   BoilerBrush.com.cy Fact Sheet for Healthcare Providers: https://pope.com/ This test is not yet approved or cleared  by the [place] FDA and has been authorized for detection and/or diagnosis of SARS-CoV-2 by FDA under an Emergency Use Authorization (EUA).  This EUA will remain in effect (meaning this test can be used) for the duration of the COVID-19 declaration under Section  564(b)(1) of the Act, 21 U.S.C. section 360bbb-3(b)(1), unless the authorization is terminated or revoked sooner. Performed at [facility], [address] [place], [place] [address]   MRSA PCR Screening     Status: None   Collection Time: 02/06/20  2:12 AM   Specimen: Nasal Mucosa; Nasopharyngeal  Result Value Ref Range Status   MRSA by PCR  NEGATIVE NEGATIVE Final    Comment:        The GeneXpert MRSA Assay (FDA approved for NASAL specimens only), is one component of a comprehensive MRSA colonization surveillance program. It is not intended to diagnose MRSA infection nor to guide or monitor treatment for MRSA infections. Performed at [facility], [address], [place], [place] [address]    Creatinine: Recent Labs    02/04/20 1601 02/05/20 0134 02/05/20 0442 02/06/20 0242  CREATININE 0.78 0.85 0.89 1.12*   Baseline Creatinine: 0.8  Impression/Assessment:  58yo with urinary retention  Plan:  1. 14 french foley placed and 400cc of clear urine drained. The patient can followup in 1 week for voiding trial at [facility]  Wilkie Aye 02/06/2020, 4:51 PM

## 2020-02-06 NOTE — NC FL2 (Signed)
[facility] MEDICAID FL2 LEVEL OF CARE SCREENING TOOL     IDENTIFICATION  Patient Name: Tara Brown Birthdate: Oct 22, 1960 Sex: female Admission Date (Current Location): 02/04/2020  [facility] and [place] Number:  Producer, television/film/video and Address:  [facility],  [address], [place] [address]      Provider Number: [id number]  Attending Physician Name and Address:  Briant Cedar, MD  Relative Name and Phone Number:       Current Level of Care: Hospital Recommended Level of Care: Skilled Nursing Facility Prior Approval Number:    Date Approved/Denied:   PASRR Number:    Discharge Plan: SNF    Current Diagnoses: Patient Active Problem List   Diagnosis Date Noted  . Pressure injury of skin 02/06/2020  . Hip fx (HCC) 02/05/2020  . Closed right hip fracture (HCC) 02/05/2020  . MDD (major depressive disorder) 02/17/2017  . Osteoporosis 05/29/2016  . HTN (hypertension) 05/29/2016  . MDD (major depressive disorder), recurrent severe, without psychosis (HCC) 05/28/2016  . Rheumatoid arthritis (HCC) 12/06/2015  . Anemia of chronic disease 03/08/2015    Orientation RESPIRATION BLADDER Height & Weight     Self, Time, Situation, Place  O2(see dc summary) Continent Weight: 91 lb 14.9 oz (41.7 kg) Height:  5\' 4"  (162.6 cm)  BEHAVIORAL SYMPTOMS/MOOD NEUROLOGICAL BOWEL NUTRITION STATUS      Continent Diet(see dc summary)  AMBULATORY STATUS COMMUNICATION OF NEEDS Skin   Extensive Assist Verbally Surgical wounds                       Personal Care Assistance Level of Assistance  Bathing, Feeding, Dressing Bathing Assistance: Limited assistance Feeding assistance: Limited assistance Dressing Assistance: Limited assistance     Functional Limitations Info  Sight, Hearing, Speech Sight Info: Adequate Hearing Info: Adequate Speech Info: Adequate    SPECIAL CARE FACTORS FREQUENCY  PT (By licensed PT), OT (By licensed OT)     PT Frequency: 5x  week OT Frequency: 3x week            Contractures Contractures Info: Present(bilateral hands)    Additional Factors Info  Code Status, Allergies, Psychotropic Code Status Info: Full Allergies Info: NKA Psychotropic Info: Wellbutrin, Lexapro         Current Medications (02/06/2020):  This is the current hospital active medication list Current Facility-Administered Medications  Medication Dose Route Frequency Provider Last Rate Last Admin  . 0.9 %  sodium chloride infusion (Manually program via Guardrails IV Fluids)   Intravenous Once 02/08/2020 M, DO      . acetaminophen (TYLENOL) tablet 325-650 mg  325-650 mg Oral Q6H PRN M K, [name]      . acetaminophen (TYLENOL) tablet 500 mg  500 mg Oral Q6H Brown, Blaine K, [name]      . alum & mag hydroxide-simeth (MAALOX/MYLANTA) 200-200-20 MG/5ML suspension 30 mL  30 mL Oral Q4H PRN 03-20-2001 K, [name]      . bisacodyl (DULCOLAX) suppository 10 mg  10 mg Rectal Daily PRN Janine Ores K, [name]      . buPROPion (WELLBUTRIN XL) 24 hr tablet 150 mg  150 mg Oral Daily Janine Ores K, [name]   150 mg at 02/06/20 0935  . chlorhexidine (PERIDEX) 0.12 % solution 15 mL  15 mL Mouth Rinse BID 02/08/20, MD   15 mL at 02/06/20 0935  . Chlorhexidine Gluconate Cloth 2 % PADS 6 each  6 each Topical Daily 02/08/20  J, MD   6 each at 02/06/20 1003  . docusate sodium (COLACE) capsule 100 mg  100 mg Oral BID Merlene Pulling K, [name]   100 mg at 02/06/20 0935  . escitalopram (LEXAPRO) tablet 20 mg  20 mg Oral Daily Merlene Pulling K, [name]   20 mg at 02/06/20 0935  . feeding supplement (ENSURE ENLIVE) (ENSURE ENLIVE) liquid 237 mL  237 mL Oral TID BM Alma Friendly, MD      . HYDROcodone-acetaminophen [facility]) 7.5-325 MG per tablet 1-2 tablet  1-2 tablet Oral Q4H PRN Merlene Pulling K, [name]   2 tablet at 02/05/20 2117  . HYDROcodone-acetaminophen (NORCO/VICODIN) 5-325 MG per tablet 1-2 tablet  1-2 tablet Oral Q4H PRN Merlene Pulling K, [name]   2 tablet at 02/06/20 1212  . ketorolac (TORADOL) 15 MG/ML injection 7.5 mg  7.5 mg Intravenous Q6H Brown, Blaine K, [name]   7.5 mg at 02/06/20 1210  . magnesium citrate solution 1 Bottle  1 Bottle Oral Once PRN Merlene Pulling K, [name]      . MEDLINE mouth rinse  15 mL Mouth Rinse q12n4p Alma Friendly, MD      . MEDLINE mouth rinse  15 mL Mouth Rinse BID Alma Friendly, MD   15 mL at 02/06/20 1203  . menthol-cetylpyridinium (CEPACOL) lozenge 3 mg  1 lozenge Oral PRN Merlene Pulling K, [name]       Or  . phenol (CHLORASEPTIC) mouth spray 1 spray  1 spray Mouth/Throat PRN Merlene Pulling K, [name]      . morphine 4 MG/ML injection 0.52-1 mg  0.52-1 mg Intravenous Q2H PRN Owens Shark, Blaine K, [name]      . ondansetron [facility]) tablet 4 mg  4 mg Oral Q6H PRN Merlene Pulling K, [name]       Or  . ondansetron [facility]) injection 4 mg  4 mg Intravenous Q6H PRN Merlene Pulling K, [name]      . polyethylene glycol (MIRALAX / GLYCOLAX) packet 17 g  17 g Oral Daily PRN Merlene Pulling K, [name]      . predniSONE (DELTASONE) tablet 5 mg  5 mg Oral Daily Merlene Pulling K, [name]   5 mg at 02/06/20 0935  . senna (SENOKOT) tablet 8.6 mg  1 tablet Oral BID Merlene Pulling K, [name]   8.6 mg at 02/06/20 0935  . vitamin B-12 (CYANOCOBALAMIN) tablet 1,000 mcg  1,000 mcg Oral Daily Ventura Bruns, [name]   1,000 mcg at 02/06/20 1517     Discharge Medications: Please see discharge summary for a list of discharge medications.  Relevant Imaging Results:  Relevant Lab Results:   Additional Information SSN: [id number] LCSW

## 2020-02-06 NOTE — Progress Notes (Signed)
Paatient noted to have 100 ml of urinary output at shift report. Dr Sharolyn Douglas notified new orders received

## 2020-02-06 NOTE — Plan of Care (Signed)
Discussed with patient plan of care for the remainder of the shift, pain management and admission history with some teach back displayed

## 2020-02-06 NOTE — Evaluation (Signed)
606Physical Therapy Evaluation Patient Details Name: Tara Brown MRN: [id number] DOB: [birth year] Today's Date: 02/06/2020   History of Present Illness  Patient is a 59 year old female with PMH significant for scoliosis, RA, multiple L hip sx's due to dislocations of THA, toe amputation admitted after a fall. Patient is s/p R hip IM nailing, pt also has L periarthicular acetabular fracture being treated conservatively.   Clinical Impression  The patient presents with significant joint deformities from RA. The patient is eager to mobilize. Assisted to sitting  At bed edge briefly , limited by nausea. BP 110/54. Patient did not tolerate  Right knee flexion greater than ~70 degres. The patient will need bilateral platform RW_she requests to use hers if it can be brought. Both legs are angled to the left. Patient reports that she ambulates in apartment without AD. Standing and ambulating most likely will be a challenge. Patient agreeable to Rehab. Pt admitted with above diagnosis.  Pt currently with functional limitations due to the deficits listed below (see PT Problem List). Pt will benefit from skilled PT to increase their independence and safety with mobility to allow discharge to the venue listed below.   Patient's SPO2 dropped to 84 % on RA so replaced back on 2 L.    Follow Up Recommendations SNF;Supervision/Assistance - 24 hour    Equipment Recommendations  None recommended by PT    Recommendations for Other Services       Precautions / Restrictions Precautions Precautions: Posterior Hip(assumption) Precaution Comments: hip abduction pillow for comfort, S/P multiple dislocations lt hip, last surgery for constrained liner. Restrictions Weight Bearing Restrictions: Yes RLE Weight Bearing: Weight bearing as tolerated LLE Weight Bearing: Touchdown weight bearing      Mobility  Bed Mobility Overal bed mobility: Needs Assistance Bed Mobility: Supine to Sit;Sit to Supine      Supine to sit: Mod assist;+2 for physical assistance Sit to supine: Mod assist;+2 for physical assistance   General bed mobility comments: patient require assist to advance R LE towards edge of bed and to elevate trunk. assist to lift LEs and guide trunk back to bed  Transfers                 General transfer comment: deferred due to nausea  Ambulation/Gait                Stairs            Wheelchair Mobility    Modified Rankin (Stroke Patients Only)       Balance Overall balance assessment: Needs assistance Sitting-balance support: Bilateral upper extremity supported;Feet unsupported Sitting balance-Leahy Scale: Poor Sitting balance - Comments: sat briefly, nausea  and pain limiting, required support right leg to prevent knee flexion due to pain                                     Pertinent Vitals/Pain Pain Assessment: Faces Faces Pain Scale: Hurts even more Pain Location: R hip Pain Descriptors / Indicators: Discomfort;Grimacing Pain Intervention(s): Limited activity within patient's tolerance;Monitored during session;Premedicated before session;Repositioned    Home Living Family/patient expects to be discharged to:: Skilled nursing facility Living Arrangements: Alone Available Help at Discharge: Friend(s);Family Type of Home: Apartment Home Access: Level entry     Home Layout: One level   Additional Comments: bilateral Platform RW    Prior Function Level of Independence: Needs assistance   Gait /  Transfers Assistance Needed: patient reports ambulating at home without AD prior to hospitalization  ADL's / Homemaking Assistance Needed: patient has [place] aide 2 hrs a day M-F to assist "as I need them" such as bathing, dressing, meal prep, assist with transportation        Hand Dominance        Extremity/Trunk Assessment   Upper Extremity Assessment Upper Extremity Assessment: RUE deficits/detail;LUE deficits/detail RUE  Deficits / Details: severe hand deformities,hands fisted LUE Deficits / Details: similar to right hand    Lower Extremity Assessment Lower Extremity Assessment: LLE deficits/detail;RLE deficits/detail RLE Deficits / Details: "windswept" position to the left , deformities of foot LLE Deficits / Details: extreme external rotation at the hip  and "windswept" position.    Cervical / Trunk Assessment Cervical / Trunk Assessment: Kyphotic(scoliotic) Cervical / Trunk Exceptions: scoliosis  Communication   Communication: No difficulties  Cognition Arousal/Alertness: Awake/alert Behavior During Therapy: WFL for tasks assessed/performed Overall Cognitive Status: Within Functional Limits for tasks assessed                                        General Comments General comments (skin integrity, edema, etc.): patient BP semi-supine in bed 117/80, attempted to get seated EOB however patient too nauseous    Exercises     Assessment/Plan    PT Assessment Patient needs continued PT services  PT Problem List Decreased strength;Decreased balance;Decreased cognition;Decreased knowledge of precautions;Decreased range of motion;Decreased mobility;Decreased knowledge of use of DME;Decreased activity tolerance;Decreased safety awareness;Cardiopulmonary status limiting activity       PT Treatment Interventions DME instruction;Therapeutic activities;Therapeutic exercise;Gait training;Patient/family education;Balance training;Functional mobility training    PT Goals (Current goals can be found in the Care Plan section)  Acute Rehab PT Goals Patient Stated Goal: go to rehab PT Goal Formulation: With patient Time For Goal Achievement: 02/20/20 Potential to Achieve Goals: Fair    Frequency Min 3X/week   Barriers to discharge Decreased caregiver support      Co-evaluation PT/OT/SLP Co-Evaluation/Treatment: Yes Reason for Co-Treatment: Complexity of the patient's impairments  (multi-system involvement);For patient/therapist safety PT goals addressed during session: Mobility/safety with mobility OT goals addressed during session: ADL's and self-care       AM-PAC PT "6 Clicks" Mobility  Outcome Measure Help needed turning from your back to your side while in a flat bed without using bedrails?: A Lot Help needed moving from lying on your back to sitting on the side of a flat bed without using bedrails?: Total Help needed moving to and from a bed to a chair (including a wheelchair)?: Total Help needed standing up from a chair using your arms (e.g., wheelchair or bedside chair)?: Total Help needed to walk in hospital room?: Total Help needed climbing 3-5 steps with a railing? : Total 6 Click Score: 7    End of Session   Activity Tolerance: Patient limited by pain;Treatment limited secondary to medical complications (Comment) Patient left: in bed;with call bell/phone within reach;with bed alarm set Nurse Communication: Mobility status PT Visit Diagnosis: Unsteadiness on feet (R26.81);Difficulty in walking, not elsewhere classified (R26.2);Repeated falls (R29.6);Pain Pain - Right/Left: Right Pain - part of body: Leg    Time: 1202-1228 PT Time Calculation (min) (ACUTE ONLY): 26 min   Charges:   PT Evaluation $PT Eval Moderate Complexity: [place] Pager  [phone] Office [phone]   Rada Hay 02/06/2020, 3:06 PM

## 2020-02-06 NOTE — Significant Event (Signed)
Contacted by Bedside nurse regarding patient retention of urine and inability to place foley catheter. I went to bedside and attempted placement with much resistance noted. I then attempted in and out cath (14 [place]) again with much resistance noted.   Urology was consulted with plans to come to beside.

## 2020-02-06 NOTE — Progress Notes (Addendum)
Subjective: 1 Day Post-Op s/p Procedure(s): RIGHT HIP, INTRAMEDULLARY NAIL FIXATION   Patient is alert, oriented, laying in bed. Reports pain to be moderate but manageable, right hip more painful than left. Denies chest pain or shortness of breath this morning. Denies abdominal pain, nausea, or vomiting. Asking for hip abduction pillow for comfort.   Objective:  PE: VITALS:   Vitals:   02/06/20 0630 02/06/20 0700 02/06/20 0800 02/06/20 0811  BP: (!) 109/58 94/61  96/67  Pulse: 70 70  66  Resp: 13 20  17   Temp:   97.8 F (36.6 C)   TempSrc:   Oral   SpO2: 99% 98%  100%  Weight:      Height:       General: Patient alert, oriented, laying in bed, in no acute distress MSK: Surgical dressing C/D/I. EHL and FHL intact though limited, not creating pain today. Distal sensation intact.  Cervical motion is severely limited. Severe deformity of hands and feet.   LABS  Results for orders placed or performed during the hospital encounter of 02/04/20 (from the past 24 hour(s))  Ferritin     Status: None   Collection Time: 02/05/20 10:39 AM  Result Value Ref Range   Ferritin 63 11 - 307 ng/mL  Folate     Status: None   Collection Time: 02/05/20 10:39 AM  Result Value Ref Range   Folate 12.2 >5.9 ng/mL  Iron and TIBC     Status: Abnormal   Collection Time: 02/05/20 10:39 AM  Result Value Ref Range   Iron 11 (L) 28 - 170 ug/dL   TIBC 251 250 - 450 ug/dL   Saturation Ratios 4 (L) 10.4 - 31.8 %   UIBC 240 ug/dL  Vitamin B12     Status: None   Collection Time: 02/05/20 10:39 AM  Result Value Ref Range   Vitamin B-12 198 180 - 914 pg/mL  Prepare RBC (crossmatch)     Status: None   Collection Time: 02/05/20  1:57 PM  Result Value Ref Range   Order Confirmation      ORDER PROCESSED BY BLOOD BANK Performed at [facility], [place] [address], [place], [place] [address]   MRSA PCR Screening     Status: None   Collection Time: 02/06/20  2:12 AM   Specimen: Nasal  Mucosa; Nasopharyngeal  Result Value Ref Range   MRSA by PCR NEGATIVE NEGATIVE  CBC with Differential/Platelet     Status: Abnormal   Collection Time: 02/06/20  2:42 AM  Result Value Ref Range   WBC 8.4 4.0 - 10.5 K/uL   RBC 3.20 (L) 3.87 - 5.11 MIL/uL   Hemoglobin 8.9 (L) 12.0 - 15.0 g/dL   HCT 29.2 (L) 36.0 - 46.0 %   MCV 91.3 80.0 - 100.0 fL   MCH 27.8 26.0 - 34.0 pg   MCHC 30.5 30.0 - 36.0 g/dL   RDW 16.9 (H) 11.5 - 15.5 %   Platelets 240 150 - 400 K/uL   nRBC 0.0 0.0 - 0.2 %   Neutrophils Relative % 91 %   Neutro Abs 7.6 1.7 - 7.7 K/uL   Lymphocytes Relative 4 %   Lymphs Abs 0.3 (L) 0.7 - 4.0 K/uL   Monocytes Relative 5 %   Monocytes Absolute 0.4 0.1 - 1.0 K/uL   Eosinophils Relative 0 %   Eosinophils Absolute 0.0 0.0 - 0.5 K/uL   Basophils Relative 0 %   Basophils Absolute 0.0 0.0 -  0.1 K/uL   Immature Granulocytes 0 %   Abs Immature Granulocytes 0.03 0.00 - 0.07 K/uL  Basic metabolic panel     Status: Abnormal   Collection Time: 02/06/20  2:42 AM  Result Value Ref Range   Sodium 134 (L) 135 - 145 mmol/L   Potassium 4.7 3.5 - 5.1 mmol/L   Chloride 100 98 - 111 mmol/L   CO2 24 22 - 32 mmol/L   Glucose, Bld 224 (H) 70 - 99 mg/dL   BUN 24 (H) 6 - 20 mg/dL   Creatinine, Ser 1.60 (H) 0.44 - 1.00 mg/dL   Calcium 7.3 (L) 8.9 - 10.3 mg/dL   GFR calc non Af Amer 53 (L) >60 mL/min   GFR calc Af Amer >60 >60 mL/min   Anion gap 10 5 - 15    DG Chest 2 View  Result Date: 02/04/2020 CLINICAL DATA:  Fall EXAM: CHEST - 2 VIEW COMPARISON:  01/08/2020 FINDINGS: Examination is limited by severe scoliosis. Lateral view limited by patient's arms and positioning. No gross pleural effusion. Linear atelectasis at the left base. Stable cardiomediastinal silhouette. Old appearing bilateral rib fractures. No gross pneumothorax allowing for positioning and arc technique. Chronic deformities of the proximal humeri. IMPRESSION: Limited by positioning and technique. Subsegmental atelectasis or  scar at the left base. Electronically Signed   By: Jasmine Pang M.D.   On: 02/04/2020 18:15   DG Knee 1-2 Views Right  Result Date: 02/04/2020 CLINICAL DATA:  Fall with pain EXAM: RIGHT KNEE - 1-2 VIEW COMPARISON:  None. FINDINGS: Prior knee replacement with intact hardware and normal alignment. Bones appear osteopenic. No acute displaced fracture or malalignment. No large knee effusion. Diffuse soft tissue atrophy. Possible chronic fracture deformity of the proximal shaft of the fibula IMPRESSION: Right knee replacement without acute osseous abnormality Electronically Signed   By: Jasmine Pang M.D.   On: 02/04/2020 18:28   CT ABDOMEN PELVIS W CONTRAST  Result Date: 02/04/2020 CLINICAL DATA:  Larey Seat, found down, right hip and pelvic pain EXAM: CT ABDOMEN AND PELVIS WITH CONTRAST TECHNIQUE: Multidetector CT imaging of the abdomen and pelvis was performed using the standard protocol following bolus administration of intravenous contrast. CONTRAST:  29mL OMNIPAQUE IOHEXOL 300 MG/ML  SOLN COMPARISON:  08/17/2010, 02/04/2020 FINDINGS: Lower chest: Hypoventilatory changes are seen at the left lung base. No acute pleural or parenchymal lung disease. Hepatobiliary: Liver compatible with hemangioma 2.6 cm hypodensity right lobe. No intrahepatic duct dilation. The gallbladder is unremarkable. Pancreas: Unremarkable. No pancreatic ductal dilatation or surrounding inflammatory changes. Spleen: Normal in size without focal abnormality. Adrenals/Urinary Tract: There is bilateral renal cortical atrophy, with small bilateral renal cortical cysts. No urinary tract calculi or obstructive uropathy. Bladder is decompressed. The adrenals are unremarkable. Stomach/Bowel: There is a large amount of stool within the rectal vault consistent with fecal impaction. No bowel obstruction or ileus. No bowel wall thickening or inflammatory change. Vascular/Lymphatic: Aortic atherosclerosis. No enlarged abdominal or pelvic lymph nodes.  Reproductive: Uterus and bilateral adnexa are unremarkable. Other: No free fluid or free gas.  No abdominal wall hernia. Musculoskeletal: There is a mildly comminuted left iliac bone fracture, extending through the left acetabular roof. Fracture line extends along the superolateral margin of the acetabular component of the left hip arthroplasty. There is a markedly comminuted and impacted inter trochanteric proximal right femur fracture. Postsurgical changes right iliac crest. Prior healed bilateral pubic rami fractures. Reconstructed images demonstrate no additional findings. IMPRESSION: 1. Comminuted proximal right femoral intertrochanteric fracture. 2.  Comminuted fracture left iliac bone, involving the superior lateral aspect of the femoral component of the left hip arthroplasty. 3. Fecal impaction. Electronically Signed   By: Sharlet Salina M.D.   On: 02/04/2020 20:26   Pelvis Portable  Result Date: 02/05/2020 CLINICAL DATA:  Status post right intramedullary rod fixation. EXAM: PORTABLE PELVIS 1-2 VIEWS COMPARISON:  Jan 11, 2020. FINDINGS: Status post left total hip arthroplasty. Status post intramedullary rod fixation of mildly displaced subtrochanteric fracture of the proximal right femur. Expected postoperative changes are noted in the soft tissues. IMPRESSION: Status post intramedullary rod fixation of mildly displaced subtrochanteric fracture of proximal right femur. Electronically Signed   By: Lupita Raider M.D.   On: 02/05/2020 19:04   CT L-SPINE NO CHARGE  Result Date: 02/04/2020 CLINICAL DATA:  Larey Seat, hip fracture EXAM: CT LUMBAR SPINE WITHOUT CONTRAST TECHNIQUE: Multidetector CT imaging of the lumbar spine was performed without intravenous contrast administration. Multiplanar CT image reconstructions were also generated. COMPARISON:  04/24/2019 FINDINGS: Segmentation: 5 non-rib-bearing lumbar type vertebral bodies. Alignment: Mild left convex curvature centered at the thoracolumbar junction.  Otherwise alignment is anatomic. Vertebrae: No acute displaced fractures. Paraspinal and other soft tissues: Significant retained stool within the rectum. Paraspinal soft tissues are unremarkable. Disc levels: There is prominent spondylosis at T11/T12, without significant compressive sequela. Disc spaces of the lumbar spine are well preserved. IMPRESSION: 1. No acute displaced lumbar spine fracture. Electronically Signed   By: Sharlet Salina M.D.   On: 02/04/2020 20:29   DG CHEST PORT 1 VIEW  Result Date: 02/06/2020 CLINICAL DATA:  Hypoxia EXAM: PORTABLE CHEST 1 VIEW COMPARISON:  02/04/2020 FINDINGS: Unchanged severe thoracic scoliosis. No focal airspace consolidation. Cardiomediastinal contours are normal. IMPRESSION: Unchanged examination of the chest without acute airspace disease. Electronically Signed   By: Deatra Robinson M.D.   On: 02/06/2020 02:45   DG C-Arm 1-60 Min-No Report  Result Date: 02/05/2020 Fluoroscopy was utilized by the requesting physician.  No radiographic interpretation.   DG Hip Port Unilat With Pelvis 1V Right  Result Date: 02/05/2020 CLINICAL DATA:  Post right IM nail EXAM: DG HIP (WITH OR WITHOUT PELVIS) 1V PORT RIGHT COMPARISON:  02/05/2020, 02/04/2020 FINDINGS: Only a single view is submitted. Previous left hip replacement with numerous wire fragments and surgical mesh. Interval intramedullary rodding of the right femur for comminuted intertrochanteric fracture. Gas in the soft tissues consistent with recent surgery IMPRESSION: Interval intramedullary rodding of right femur for comminuted intertrochanteric fracture Electronically Signed   By: Jasmine Pang M.D.   On: 02/05/2020 19:05   DG FEMUR, MIN 2 VIEWS RIGHT  Result Date: 02/05/2020 CLINICAL DATA:  Intramedullary rod fixation of right femur. EXAM: RIGHT FEMUR 2 VIEWS Radiation exposure index: 23.567 mGy. COMPARISON:  Feb 04, 2020. FINDINGS: Four intraoperative fluoroscopic images were obtained of the right femur.  These images demonstrate intramedullary rod fixation of proximal right femoral fracture. Improved alignment of fracture components is noted. IMPRESSION: Status post intramedullary rod fixation of proximal right femoral fracture. Electronically Signed   By: Lupita Raider M.D.   On: 02/05/2020 19:05   DG Hips Bilat W or Wo Pelvis 5 Views  Result Date: 02/04/2020 CLINICAL DATA:  Larey Seat at night EXAM: DG HIP (WITH OR WITHOUT PELVIS) 5+V BILAT COMPARISON:  12/05/2019, 12/03/2019, 11/09/2016 FINDINGS: Previous surgical plate and multiple screw fixation of the right iliac bone with multiple screw fracture and screw fragments. Acute comminuted fracture involving the right trochanter and subtrochanteric femur with varus angulation the distal fracture  fragment and less than 1/4 shaft diameter displacement of distal fragment toward the midline. The femoral head on the right is normally position. Prior left hip replacement without dislocation. Surgical mesh and multiple wire fragments are again visualized. Amorphous material caudal to the femoral stem without change. Acute mildly displaced fracture through the left iliac bone, extending from the crest to the lateral aspect of the acetabulum. Chronic appearing fracture deformities of the bilateral pubic rami. IMPRESSION: 1. Prior left hip replacement without dislocation. Acute mildly displaced fracture within the left iliac bone extending from the crest to the superolateral acetabular margin. 2. Acute mildly comminuted, displaced and angulated fracture involving the proximal right femur, involves the trochanter and subtrochanteric femur. Electronically Signed   By: Jasmine Pang M.D.   On: 02/04/2020 18:27    Assessment/Plan: Active Problems:   Hip fx (HCC)   Closed right hip fracture (HCC)   Pressure injury of skin  Right subtrochanteric femur fracture, left periprosthetic acetabular fracture extending from the ileum just adjacent to the constrained prosthesis  which was recently revised  End stage rheumatoid arthritis with severe deformity throughout MSK system  Plan: - plan to treat acetabular fracture non-operatively, reached out to total joint specialist colleagues regarding their recommendations of treatement - 1- day post-op right hip IM nail - received 2 units of blood yesterday during surgery, Hbg increased to 8.9 today  Weightbearing: Pivot transfer only RLE, NWB LLE, PT eval ordered - ambulatory at baseline Insicional and dressing care: PRN dressing changes Orthopedic device(s): patient requested abduction pillow for comfort, this has been ordered VTE prophylaxis: Holding DVT prophylaxis at this time due to low Hbg, can start lovenox if Hbg stablizes Pain control: continue current regimen Follow - up plan: Follow-up with Dr. Dion Saucier in 2 weeks   Armida Sans 02/06/2020, 9:54 AM   Contact information:   Weekdays [address], [name] [phone] A fter hours and holidays please check [email or web address] for group call information for [facility]

## 2020-02-07 ENCOUNTER — Inpatient Hospital Stay (HOSPITAL_COMMUNITY): Payer: Medicare HMO

## 2020-02-07 LAB — BASIC METABOLIC PANEL
Anion gap: 12 (ref 5–15)
BUN: 33 mg/dL — ABNORMAL HIGH (ref 6–20)
CO2: 23 mmol/L (ref 22–32)
Calcium: 7.4 mg/dL — ABNORMAL LOW (ref 8.9–10.3)
Chloride: 98 mmol/L (ref 98–111)
Creatinine, Ser: 1.15 mg/dL — ABNORMAL HIGH (ref 0.44–1.00)
GFR calc Af Amer: 60 mL/min — ABNORMAL LOW (ref >60)
GFR calc non Af Amer: 52 mL/min — ABNORMAL LOW (ref >60)
Glucose, Bld: 130 mg/dL — ABNORMAL HIGH (ref 70–99)
Potassium: 4.7 mmol/L (ref 3.5–5.1)
Sodium: 133 mmol/L — ABNORMAL LOW (ref 135–145)

## 2020-02-07 LAB — CBC WITH DIFFERENTIAL/PLATELET
Abs Immature Granulocytes: 0.07 10*3/uL (ref 0.00–0.07)
Basophils Absolute: 0 10*3/uL (ref 0.0–0.1)
Basophils Relative: 0 %
Eosinophils Absolute: 0 10*3/uL (ref 0.0–0.5)
Eosinophils Relative: 0 %
HCT: 32.7 % — ABNORMAL LOW (ref 36.0–46.0)
Hemoglobin: 9.5 g/dL — ABNORMAL LOW (ref 12.0–15.0)
Immature Granulocytes: 1 %
Lymphocytes Relative: 11 %
Lymphs Abs: 1.5 10*3/uL (ref 0.7–4.0)
MCH: 27.3 pg (ref 26.0–34.0)
MCHC: 29.1 g/dL — ABNORMAL LOW (ref 30.0–36.0)
MCV: 94 fL (ref 80.0–100.0)
Monocytes Absolute: 0.9 10*3/uL (ref 0.1–1.0)
Monocytes Relative: 6 %
Neutro Abs: 11.2 10*3/uL — ABNORMAL HIGH (ref 1.7–7.7)
Neutrophils Relative %: 82 %
Platelets: 361 10*3/uL (ref 150–400)
RBC: 3.48 MIL/uL — ABNORMAL LOW (ref 3.87–5.11)
RDW: 17.2 % — ABNORMAL HIGH (ref 11.5–15.5)
WBC: 13.7 10*3/uL — ABNORMAL HIGH (ref 4.0–10.5)
nRBC: 0 % (ref 0.0–0.2)

## 2020-02-07 MED ORDER — IPRATROPIUM-ALBUTEROL 0.5-2.5 (3) MG/3ML IN SOLN
3.0000 mL | Freq: Three times a day (TID) | RESPIRATORY_TRACT | Status: DC
  Administered 2020-02-08 – 2020-02-09 (×4): 3 mL via RESPIRATORY_TRACT
  Filled 2020-02-07 (×5): qty 3

## 2020-02-07 MED ORDER — ZOLPIDEM TARTRATE 5 MG PO TABS
5.0000 mg | ORAL_TABLET | Freq: Every evening | ORAL | Status: DC | PRN
  Administered 2020-02-07 – 2020-02-16 (×10): 5 mg via ORAL
  Filled 2020-02-07 (×10): qty 1

## 2020-02-07 MED ORDER — ZOLPIDEM TARTRATE 10 MG PO TABS: 5.0000 mg | ORAL_TABLET | Freq: Every evening | ORAL | Status: DC | PRN

## 2020-02-07 MED ORDER — IPRATROPIUM-ALBUTEROL 0.5-2.5 (3) MG/3ML IN SOLN
3.0000 mL | Freq: Four times a day (QID) | RESPIRATORY_TRACT | Status: DC
  Administered 2020-02-07: 3 mL via RESPIRATORY_TRACT
  Filled 2020-02-07: qty 3

## 2020-02-07 NOTE — Progress Notes (Signed)
Subjective: 2 Days Post-Op Procedure(s) (LRB): RIGHT HIP, INTRAMEDULLARY NAIL FIXATION (Right) Patient reports pain as "controlled with vicodin 10"   Objective: Vital signs in last 24 hours: Temp:  [98 F (36.7 C)-99.2 F (37.3 C)] 98 F (36.7 C) (05/29 0525) Pulse Rate:  [71-107] 107 (05/29 0525) Resp:  [18-23] 18 (05/29 0525) BP: (93-122)/(44-72) 105/70 (05/29 0525) SpO2:  [89 %-98 %] 93 % (05/29 0700)  Intake/Output from previous day: 05/28 0701 - 05/29 0700 In: 1585.8 [P.O.:100; I.V.:1485.8] Out: 500 [Urine:500] Intake/Output this shift: No intake/output data recorded.  Recent Labs    02/04/20 1601 02/05/20 0134 02/05/20 0442 02/06/20 0242 02/07/20 0415  HGB 8.4* 7.1* 7.2* 8.9* 9.5*   Recent Labs    02/06/20 0242 02/07/20 0415  WBC 8.4 13.7*  RBC 3.20* 3.48*  HCT 29.2* 32.7*  PLT 240 361   Recent Labs    02/06/20 0242 02/07/20 0415  NA 134* 133*  K 4.7 4.7  CL 100 98  CO2 24 23  BUN 24* 33*  CREATININE 1.12* 1.15*  GLUCOSE 224* 130*  CALCIUM 7.3* 7.4*   No results for input(s): LABPT, INR in the last 72 hours.  surgical wounds well approximated.  minimal drainage on dressings   Assessment/Plan: 2 Days Post-Op Procedure(s) (LRB): RIGHT HIP, INTRAMEDULLARY NAIL FIXATION (Right) Tara Brown a 59 y.o.femalewith medical history significant for rheumatoid arthritis with extensive scoliosis, major depressive disorder, hypertension, osteopenia, insomnia severe malnutrition presents to the hospitals/p fall, found down at home by health aide was on floor for over12hours. Patient states she had a mechanical fall. She notes no loc or head trauma , but s/p fall unable to get up and note b/l hip pain and back pain. In the ED, VSS, labs stable from prior. Xray showed right subtrochanteric femur fracture, left periprosthetic acetabular fracture.    Patient had IM nailing of right femur fracture and closed treatment of her left acetabular fracture by  Dr Dion Saucier.  She is nonweightbearing on her left lower extremity and can PIVOT ONLY on her right leg for transfers.    Plan is for SNF when medically stable sometime next week     Aubrianna Orchard J Loisann Roach 02/07/2020, 9:30 AM

## 2020-02-07 NOTE — Progress Notes (Addendum)
PROGRESS NOTE  Tara Brown WUJ:[id number] DOB: December 28, 1960 DOA: 02/04/2020 PCP: [name], [name]  HPI/Recap of past 24 hours: HPI from Dr Tara Brown is a 59 y.o. female with medical history significant for rheumatoid arthritis with extensive scoliosis, major depressive disorder, hypertension, osteopenia, insomnia severe malnutrition presents to the hospital s/p fall, found down at home by health aide was on floor for over 12 hours.  Patient states she had a mechanical fall. She notes no loc or head trauma , but s/p fall unable to get up and note b/l hip pain and back pain. In the ED, VSS, labs stable from prior. Xray showed right subtrochanteric femur fracture, left periprosthetic acetabular fracture.  Orthopedics consulted.  Patient admitted for further management.    Overnight, noted to have poor urine output, IV fluids implemented, urine output improving.  Today, patient denies any new complaints, postop pain controlled with medications.  Patient denies any shortness of breath, chest pain, abdominal pain, nausea/vomiting, fever/chills.   Assessment/Plan: Active Problems:   Hip fx (HCC)   Closed right hip fracture (HCC)   Pressure injury of skin   Protein-calorie malnutrition, severe   Right subtrochanteric femur fracture, left periprosthetic acetabular fracture s/p right hip intramedullary nail fixation on 02/05/2020 by Dr Dion Saucier Orthopedics on board Of note patient has complicated history of recent repair of fracture on the left at [facility], orthopedics on board, plan to consult her orthopedic surgeon at [facility] Pain management, DVT ppx per orthopedics PT/OT s/p surgery  Poor urine output/Acute urinary retention UO improving s/p IVF Difficult Foley placement by urology, draining about 800 cc of urine on 02/06/2020 Continue IV fluids, continue Foley  Acute hypoxic respiratory failure Improved, no longer requiring BiPAP Likely 2/2 postop complication, ?   Atelectasis Chest x-ray unremarkable Supplemental O2, plan to wean off, incentive spirometry  ABLA/Iron def anemia/B12 deficiency Baseline hemoglobin around 8 Received 2 units of blood on 02/05/2020 Anemia panel showed iron 11, sats 4%, vitamin B12 198 S/P one dose of Feraheme on 02/05/2020, start vitamin B12 and oral iron supplementation Daily CBC  Rheumatoid arthritis/extensive scoliosis/osteopenia Chronic debility , multiple fall with fracture Hold MTX per guidelines, continue prednisone, weekly Fosamax  Leukocytosis likely 2/2 prednisone  Hypertension BP soft Hold home BP meds for now, lisinopril IVF as above  Depression Continue home Lexapro, Wellbutrin  Severe malnutrition Dietitian consult placed        Malnutrition Type:  Nutrition Problem: Severe Malnutrition Etiology: chronic illness(RA and scoliosis)   Malnutrition Characteristics:  Signs/Symptoms: moderate fat depletion, moderate muscle depletion, severe fat depletion, severe muscle depletion   Nutrition Interventions:  Interventions: Ensure Enlive (each supplement provides 350kcal and 20 grams of protein), Snacks    Estimated body mass index is 15.78 kg/m as calculated from the following:   Height as of this encounter: 5\' 4"  (1.626 m).   Weight as of this encounter: 41.7 kg.     Code Status: Full  Family Communication: Discussed extensively with patient  Disposition Plan: Status is: Inpatient  Remains inpatient appropriate because:Inpatient level of care appropriate due to severity of illness   Dispo: The patient is from: Home              Anticipated d/c is to: SNF              Anticipated d/c date is: 2 days              Patient currently is not medically stable to d/c.    Consultants:  Orthopedics  Procedures:  None  Antimicrobials:  None  DVT prophylaxis: SCD due to low hemoglobin, defer to orthopedics to start   Objective: Vitals:   02/07/20 0525 02/07/20  0537 02/07/20 0700 02/07/20 1336  BP: 105/70   98/76  Pulse: (!) 107   (!) 57  Resp: 18   16  Temp: 98 F (36.7 C)   98.1 F (36.7 C)  TempSrc: Oral   Oral  SpO2: (!) 89% 95% 93% 90%  Weight:      Height:        Intake/Output Summary (Last 24 hours) at 02/07/2020 1432 Last data filed at 02/07/2020 1338 Gross per 24 hour  Intake 1705.83 ml  Output 700 ml  Net 1005.83 ml   Filed Weights   02/04/20 1531 02/05/20 1151 02/06/20 0246  Weight: 47 kg 47 kg 41.7 kg    Exam:  General: NAD, chronically ill-appearing, cachectic, scoliosis noted  Cardiovascular: S1, S2 present  Respiratory: CTAB  Abdomen: Soft, nontender, nondistended, bowel sounds present  Musculoskeletal: No bilateral pedal edema noted, + joint deformity upper and lower extremities with noted contractures, right hip dressing C/D/I  Skin: Normal  Psychiatry: Normal mood   Data Reviewed: CBC: Recent Labs  Lab 02/04/20 1601 02/05/20 0134 02/05/20 0442 02/06/20 0242 02/07/20 0415  WBC 10.3 7.6 8.0 8.4 13.7*  NEUTROABS 8.0*  --   --  7.6 11.2*  HGB 8.4* 7.1* 7.2* 8.9* 9.5*  HCT 27.4* 23.3* 23.6* 29.2* 32.7*  MCV 88.4 87.3 88.7 91.3 94.0  PLT 370 321 316 240 361   Basic Metabolic Panel: Recent Labs  Lab 02/04/20 1601 02/05/20 0134 02/05/20 0442 02/06/20 0242 02/07/20 0415  NA 133*  --  134* 134* 133*  K 3.9  --  4.0 4.7 4.7  CL 94*  --  96* 100 98  CO2 26  --  24 24 23   GLUCOSE 124*  --  96 224* 130*  BUN 19  --  19 24* 33*  CREATININE 0.78 0.85 0.89 1.12* 1.15*  CALCIUM 8.4*  --  7.8* 7.3* 7.4*   GFR: Estimated Creatinine Clearance: 34.2 mL/min (A) (by C-G formula based on SCr of 1.15 mg/dL (H)). Liver Function Tests: Recent Labs  Lab 02/04/20 1601  AST 19  ALT 12  ALKPHOS 87  BILITOT 0.3  PROT 7.6  ALBUMIN 3.5   No results for input(s): LIPASE, AMYLASE in the last 168 hours. No results for input(s): AMMONIA in the last 168 hours. Coagulation Profile: No results for input(s):  INR, PROTIME in the last 168 hours. Cardiac Enzymes: Recent Labs  Lab 02/04/20 1601  CKTOTAL 410*   BNP (last 3 results) No results for input(s): PROBNP in the last 8760 hours. HbA1C: No results for input(s): HGBA1C in the last 72 hours. CBG: No results for input(s): GLUCAP in the last 168 hours. Lipid Profile: No results for input(s): CHOL, HDL, LDLCALC, TRIG, CHOLHDL, LDLDIRECT in the last 72 hours. Thyroid Function Tests: No results for input(s): TSH, T4TOTAL, FREET4, T3FREE, THYROIDAB in the last 72 hours. Anemia Panel: Recent Labs    02/05/20 1039  VITAMINB12 198  FOLATE 12.2  FERRITIN 63  TIBC 251  IRON 11*   Urine analysis:    Component Value Date/Time   COLORURINE AMBER (A) 08/07/2015 1020   APPEARANCEUR CLOUDY (A) 08/07/2015 1020   LABSPEC 1.021 08/07/2015 1020   PHURINE 6.0 08/07/2015 1020   GLUCOSEU NEGATIVE 08/07/2015 1020   HGBUR LARGE (A) 08/07/2015 1020   BILIRUBINUR MODERATE (  A) 08/07/2015 1020   KETONESUR 15 (A) 08/07/2015 1020   PROTEINUR 100 (A) 08/07/2015 1020   UROBILINOGEN 2.0 (H) 03/07/2015 1832   NITRITE NEGATIVE 08/07/2015 1020   LEUKOCYTESUR LARGE (A) 08/07/2015 1020   Sepsis Labs: @LABRCNTIP (procalcitonin:4,lacticidven:4)  ) Recent Results (from the past 240 hour(s))  SARS Coronavirus 2 by RT PCR (hospital order, performed in [facility] hospital lab) Nasopharyngeal Nasopharyngeal Swab     Status: None   Collection Time: 02/05/20 12:20 AM   Specimen: Nasopharyngeal Swab  Result Value Ref Range Status   SARS Coronavirus 2 NEGATIVE NEGATIVE Final    Comment: (NOTE) SARS-CoV-2 target nucleic acids are NOT DETECTED. The SARS-CoV-2 RNA is generally detectable in upper and lower respiratory specimens during the acute phase of infection. The lowest concentration of SARS-CoV-2 viral copies this assay can detect is 250 copies / mL. A negative result does not preclude SARS-CoV-2 infection and should not be used as the sole basis for  treatment or other patient management decisions.  A negative result may occur with improper specimen collection / handling, submission of specimen other than nasopharyngeal swab, presence of viral mutation(s) within the areas targeted by this assay, and inadequate number of viral copies (<250 copies / mL). A negative result must be combined with clinical observations, patient history, and epidemiological information. Fact Sheet for Patients:   StrictlyIdeas.no Fact Sheet for Healthcare Providers: BankingDealers.co.za This test is not yet approved or cleared  by the [place] FDA and has been authorized for detection and/or diagnosis of SARS-CoV-2 by FDA under an Emergency Use Authorization (EUA).  This EUA will remain in effect (meaning this test can be used) for the duration of the COVID-19 declaration under Section 564(b)(1) of the Act, 21 U.S.C. section 360bbb-3(b)(1), unless the authorization is terminated or revoked sooner. Performed at [facility], [place] [address] [place], [place] [address]   MRSA PCR Screening     Status: None   Collection Time: 02/06/20  2:12 AM   Specimen: Nasal Mucosa; Nasopharyngeal  Result Value Ref Range Status   MRSA by PCR NEGATIVE NEGATIVE Final    Comment:        The GeneXpert MRSA Assay (FDA approved for NASAL specimens only), is one component of a comprehensive MRSA colonization surveillance program. It is not intended to diagnose MRSA infection nor to guide or monitor treatment for MRSA infections. Performed at [facility], [place] [address], [place], [place] [address]       Studies: No results found.  Scheduled Meds: . sodium chloride   Intravenous Once  . buPROPion  150 mg Oral Daily  . chlorhexidine  15 mL Mouth Rinse BID  . Chlorhexidine Gluconate Cloth  6 each Topical Daily  . docusate sodium  100 mg Oral BID  . escitalopram  20 mg Oral  Daily  . feeding supplement (ENSURE ENLIVE)  237 mL Oral TID BM  . ferrous sulfate  325 mg Oral Q breakfast  . mouth rinse  15 mL Mouth Rinse q12n4p  . mouth rinse  15 mL Mouth Rinse BID  . predniSONE  5 mg Oral Daily  . senna  1 tablet Oral BID  . vitamin B-12  1,000 mcg Oral Daily    Continuous Infusions: . sodium chloride 100 mL/hr at 02/07/20 6387     LOS: 2 days     Alma Friendly, MD [facility]  If 7PM-7AM, please contact night-coverage [email or web address] 02/07/2020, 2:32 PM

## 2020-02-07 NOTE — Progress Notes (Signed)
Physical Therapy Treatment Patient Details Name: Tara Brown MRN: [id number] DOB: 31-Oct-1960 Today's Date: 02/07/2020    History of Present Illness Patient is a 59 year old female with PMH significant for scoliosis, RA, multiple L hip sx's due to dislocations of THA, toe amputation admitted after a fall. Patient is s/p R hip IM nailing, pt also has L periarthicular acetabular fracture being treated conservatively.     PT Comments    Pt with limited participation today with therapy.  Provided max encouragement and education on importance of daily activity but pt declined any transfers.  She did participate with bed exercises with cues for correct form and AAROM at times.  Cont POC.     Follow Up Recommendations  SNF;Supervision/Assistance - 24 hour     Equipment Recommendations  None recommended by PT    Recommendations for Other Services       Precautions / Restrictions Precautions Precautions: Posterior Hip(on L - assumption from prior dislocations) Precaution Comments: hip abduction pillow for comfort, S/P multiple dislocations lt hip, last surgery for constrained liner. Restrictions RLE Weight Bearing: Weight bearing as tolerated LLE Weight Bearing: Non weight bearing    Mobility  Bed Mobility               General bed mobility comments: Refused all despite max encouragement and education  Transfers                    Ambulation/Gait                 Stairs             Wheelchair Mobility    Modified Rankin (Stroke Patients Only)       Balance       Sitting balance - Comments: refused                                    Cognition Arousal/Alertness: Lethargic Behavior During Therapy: Agitated Overall Cognitive Status: No family/caregiver present to determine baseline cognitive functioning                                 General Comments: Pt lethargic but arousable.      Exercises General  Exercises - Lower Extremity Ankle Circles/Pumps: AROM;10 reps;Supine Quad Sets: AROM;Both;10 reps;Supine Short Arc Quad: AAROM;Both;10 reps;Supine Heel Slides: AAROM;Right;AROM;Left;10 reps;Supine    General Comments General comments (skin integrity, edema, etc.): Pt adamantly refused all transfers.  Spent increased time educating on importance of daily activity for orthopedic recovery as well as preventing PNE (particularly with her scoliosis/posture) and blood clots.  Pt stated " I know but I don't feel like it and I'm not doing it today, I did it yesterday and got dizzy."  Provided further education that will get weaker and could be more dizzy the longer she stays in bed.  Pt still refused.  Agreed to exercises      Pertinent Vitals/Pain Pain Assessment: Faces Faces Pain Scale: Hurts a little bit Pain Location: R hip Pain Descriptors / Indicators: Discomfort;Grimacing Pain Intervention(s): Limited activity within patient's tolerance;Premedicated before session;Repositioned    Home Living                      Prior Function            PT Goals (current goals  can now be found in the care plan section) Acute Rehab PT Goals Patient Stated Goal: go to rehab PT Goal Formulation: With patient Potential to Achieve Goals: Fair Progress towards PT goals: Progressing toward goals    Frequency    Min 3X/week      PT Plan Current plan remains appropriate    Co-evaluation              AM-PAC PT "6 Clicks" Mobility   Outcome Measure  Help needed turning from your back to your side while in a flat bed without using bedrails?: A Lot Help needed moving from lying on your back to sitting on the side of a flat bed without using bedrails?: Total Help needed moving to and from a bed to a chair (including a wheelchair)?: Total Help needed standing up from a chair using your arms (e.g., wheelchair or bedside chair)?: Total Help needed to walk in hospital room?: Total Help  needed climbing 3-5 steps with a railing? : Total 6 Click Score: 7    End of Session   Activity Tolerance: Other (comment)(self limited) Patient left: in bed;with call bell/phone within reach;with bed alarm set Nurse Communication: Mobility status PT Visit Diagnosis: Unsteadiness on feet (R26.81);Difficulty in walking, not elsewhere classified (R26.2);Repeated falls (R29.6);Pain Pain - Right/Left: Right Pain - part of body: Leg     Time: 8315-1761 PT Time Calculation (min) (ACUTE ONLY): 18 min  Charges:  $Therapeutic Exercise: 8-22 mins                     Royetta Asal, PT Acute Rehab Services Pager [phone] [facility] [phone] [facility] [phone]    Rayetta Humphrey 02/07/2020, 11:03 AM

## 2020-02-08 LAB — BASIC METABOLIC PANEL
Anion gap: 7 (ref 5–15)
BUN: 33 mg/dL — ABNORMAL HIGH (ref 6–20)
CO2: 26 mmol/L (ref 22–32)
Calcium: 8.1 mg/dL — ABNORMAL LOW (ref 8.9–10.3)
Chloride: 105 mmol/L (ref 98–111)
Creatinine, Ser: 0.8 mg/dL (ref 0.44–1.00)
GFR calc Af Amer: >60 mL/min (ref >60)
GFR calc non Af Amer: >60 mL/min (ref >60)
Glucose, Bld: 111 mg/dL — ABNORMAL HIGH (ref 70–99)
Potassium: 5.4 mmol/L — ABNORMAL HIGH (ref 3.5–5.1)
Sodium: 138 mmol/L (ref 135–145)

## 2020-02-08 LAB — CBC WITH DIFFERENTIAL/PLATELET
Abs Immature Granulocytes: 0.1 10*3/uL — ABNORMAL HIGH (ref 0.00–0.07)
Basophils Absolute: 0 10*3/uL (ref 0.0–0.1)
Basophils Relative: 0 %
Eosinophils Absolute: 0 10*3/uL (ref 0.0–0.5)
Eosinophils Relative: 0 %
HCT: 31.8 % — ABNORMAL LOW (ref 36.0–46.0)
Hemoglobin: 9.2 g/dL — ABNORMAL LOW (ref 12.0–15.0)
Immature Granulocytes: 1 %
Lymphocytes Relative: 14 %
Lymphs Abs: 1.6 10*3/uL (ref 0.7–4.0)
MCH: 27.5 pg (ref 26.0–34.0)
MCHC: 28.9 g/dL — ABNORMAL LOW (ref 30.0–36.0)
MCV: 94.9 fL (ref 80.0–100.0)
Monocytes Absolute: 0.9 10*3/uL (ref 0.1–1.0)
Monocytes Relative: 8 %
Neutro Abs: 8.6 10*3/uL — ABNORMAL HIGH (ref 1.7–7.7)
Neutrophils Relative %: 77 %
Platelets: 324 10*3/uL (ref 150–400)
RBC: 3.35 MIL/uL — ABNORMAL LOW (ref 3.87–5.11)
RDW: 17.9 % — ABNORMAL HIGH (ref 11.5–15.5)
WBC: 11.2 10*3/uL — ABNORMAL HIGH (ref 4.0–10.5)
nRBC: 0 % (ref 0.0–0.2)

## 2020-02-08 LAB — BLOOD GAS, ARTERIAL
Acid-base deficit: 0.9 mmol/L (ref 0.0–2.0)
Bicarbonate: 26.6 mmol/L (ref 20.0–28.0)
FIO2: 32
O2 Saturation: 95.2 %
Patient temperature: 98.6
pCO2 arterial: 64.3 mmHg — ABNORMAL HIGH (ref 32.0–48.0)
pH, Arterial: 7.24 — ABNORMAL LOW (ref 7.350–7.450)
pO2, Arterial: 83.6 mmHg (ref 83.0–108.0)

## 2020-02-08 MED ORDER — MORPHINE SULFATE (PF) 2 MG/ML IV SOLN
2.0000 mg | Freq: Once | INTRAVENOUS | Status: AC
  Administered 2020-02-08: 2 mg via INTRAVENOUS
  Filled 2020-02-08: qty 1

## 2020-02-08 MED ORDER — ENOXAPARIN SODIUM 30 MG/0.3ML ~~LOC~~ SOLN
30.0000 mg | SUBCUTANEOUS | Status: DC
  Administered 2020-02-08 – 2020-02-18 (×11): 30 mg via SUBCUTANEOUS
  Filled 2020-02-08 (×11): qty 0.3

## 2020-02-08 MED ORDER — HYDROCODONE-ACETAMINOPHEN 5-325 MG PO TABS
1.0000 | ORAL_TABLET | ORAL | Status: DC | PRN
  Administered 2020-02-09 – 2020-02-11 (×7): 1 via ORAL
  Filled 2020-02-08 (×7): qty 1

## 2020-02-08 MED ORDER — SODIUM POLYSTYRENE SULFONATE 15 GM/60ML PO SUSP
15.0000 g | Freq: Once | ORAL | Status: DC
  Filled 2020-02-08: qty 60

## 2020-02-08 MED ORDER — SODIUM ZIRCONIUM CYCLOSILICATE 10 G PO PACK
10.0000 g | PACK | Freq: Every day | ORAL | Status: DC
  Administered 2020-02-08 – 2020-02-10 (×3): 10 g via ORAL
  Filled 2020-02-08 (×4): qty 1

## 2020-02-08 NOTE — Progress Notes (Signed)
PT is undergoing speak evaluation at this time, therefore unable to obtain  ordered ABG at this time. RN aware.  PT at this time appears to be alert and oriented with no respiratory distress, Sp02 on 3 lpm 97% (leaving on 3 lpm until ABG results available)- RN aware.

## 2020-02-08 NOTE — Progress Notes (Signed)
PROGRESS NOTE  Tara Brown LEX:[id number] DOB: 1960-12-30 DOA: 02/04/2020 PCP: [name], [name]  HPI/Recap of past 24 hours: HPI from Dr Tara Brown is a 59 y.o. female with medical history significant for rheumatoid arthritis with extensive scoliosis, major depressive disorder, hypertension, osteopenia, insomnia severe malnutrition presents to the hospital s/p fall, found down at home by health aide was on floor for over 12 hours.  Patient states she had a mechanical fall. She notes no loc or head trauma , but s/p fall unable to get up and note b/l hip pain and back pain. In the ED, VSS, labs stable from prior. Xray showed right subtrochanteric femur fracture, left periprosthetic acetabular fracture.  Orthopedics consulted.  Patient admitted for further management.     Today, noted patient with increased work of breathing/SOB, somewhat lethargic/drowsy this morning, denies any chest pain, abdominal pain, nausea/vomiting, fever/chills.  Reported chronic back pain.     Assessment/Plan: Active Problems:   Hip fx (HCC)   Closed right hip fracture (HCC)   Pressure injury of skin   Protein-calorie malnutrition, severe   Right subtrochanteric femur fracture, left periprosthetic acetabular fracture s/p right hip intramedullary nail fixation on 02/05/2020 by Dr Dion Saucier Orthopedics on board Of note patient has complicated history of recent repair of fracture on the left at [facility], orthopedics on board, plan to consult her orthopedic surgeon at [facility] Pain management, DVT ppx per orthopedics PT/OT s/p surgery  Poor urine output/Acute urinary retention UO improving slowly s/p IVF Difficult Foley placement by urology, draining about 800 cc of urine on 02/06/2020 Continue IV fluids, continue Foley  Acute hypoxic/hypercarbic respiratory failure Currently afebrile, with leukocytosis (on steroids) Noted increased work of breathing, has been requiring pain medication ABG  showed pH 7.24, PO2 83.6, PCO2 64.3 Chest x-ray showed new opacities at right lung base, possible atelectasis and/or infection Start BiPAP, transfer to SDU, supplemental O2 prn, incentive spirometry Monitor closely  ABLA/Iron def anemia/B12 deficiency Baseline hemoglobin around 8 Received 2 units of blood on 02/05/2020 Anemia panel showed iron 11, sats 4%, vitamin B12 198 S/P one dose of Feraheme on 02/05/2020, continue vitamin B12 and oral iron supplementation Daily CBC  Rheumatoid arthritis/extensive scoliosis/osteopenia Chronic debility , multiple fall with fracture Hold MTX per guidelines, continue prednisone, weekly Fosamax  Leukocytosis likely 2/2 prednisone, afebrile  Hypertension BP stable Hold home BP meds for now, lisinopril IVF as above  Depression Continue home Lexapro, Wellbutrin  Severe malnutrition Dietitian consult placed        Malnutrition Type:  Nutrition Problem: Severe Malnutrition Etiology: chronic illness(RA and scoliosis)   Malnutrition Characteristics:  Signs/Symptoms: moderate fat depletion, moderate muscle depletion, severe fat depletion, severe muscle depletion   Nutrition Interventions:  Interventions: Ensure Enlive (each supplement provides 350kcal and 20 grams of protein), Snacks    Estimated body mass index is 15.78 kg/m as calculated from the following:   Height as of this encounter: 5\' 4"  (1.626 m).   Weight as of this encounter: 41.7 kg.     Code Status: Full  Family Communication: Discussed extensively with patient  Disposition Plan: Status is: Inpatient  Remains inpatient appropriate because:Inpatient level of care appropriate due to severity of illness   Dispo: The patient is from: Home              Anticipated d/c is to: Home, unable to afford SNF              Anticipated d/c date is: 2 days  Patient currently is not medically stable to  d/c.    Consultants:  Orthopedics  Procedures:  None  Antimicrobials:  None  DVT prophylaxis: Lovenox   Objective: Vitals:   02/07/20 2206 02/08/20 0544 02/08/20 0924 02/08/20 1248  BP: 97/67 116/79  133/85  Pulse: 82 79  84  Resp: 16 18  18   Temp: 98.4 F (36.9 C) (!) 97.5 F (36.4 C)  97.6 F (36.4 C)  TempSrc: Oral Oral  Oral  SpO2: 98% 99% 96% 94%  Weight:      Height:        Intake/Output Summary (Last 24 hours) at 02/08/2020 1417 Last data filed at 02/08/2020 1248 Gross per 24 hour  Intake 2540 ml  Output 575 ml  Net 1965 ml   Filed Weights   02/04/20 1531 02/05/20 1151 02/06/20 0246  Weight: 47 kg 47 kg 41.7 kg    Exam:  General: NAD, chronically ill-appearing, cachectic, scoliosis noted, drowsy  Cardiovascular: S1, S2 present  Respiratory:  Diminished breath sounds bilaterally  Abdomen: Soft, nontender, nondistended, bowel sounds present  Musculoskeletal: No bilateral pedal edema noted, + joint deformity upper and lower extremities with noted contractures, right hip dressing C/D/I  Skin: Normal  Psychiatry: Normal mood   Data Reviewed: CBC: Recent Labs  Lab 02/04/20 1601 02/04/20 1601 02/05/20 0134 02/05/20 0442 02/06/20 0242 02/07/20 0415 02/08/20 0650  WBC 10.3   < > 7.6 8.0 8.4 13.7* 11.2*  NEUTROABS 8.0*  --   --   --  7.6 11.2* 8.6*  HGB 8.4*   < > 7.1* 7.2* 8.9* 9.5* 9.2*  HCT 27.4*   < > 23.3* 23.6* 29.2* 32.7* 31.8*  MCV 88.4   < > 87.3 88.7 91.3 94.0 94.9  PLT 370   < > 321 316 240 361 324   < > = values in this interval not displayed.   Basic Metabolic Panel: Recent Labs  Lab 02/04/20 1601 02/04/20 1601 02/05/20 0134 02/05/20 0442 02/06/20 0242 02/07/20 0415 02/08/20 0650  NA 133*  --   --  134* 134* 133* 138  K 3.9  --   --  4.0 4.7 4.7 5.4*  CL 94*  --   --  96* 100 98 105  CO2 26  --   --  24 24 23 26   GLUCOSE 124*  --   --  96 224* 130* 111*  BUN 19  --   --  19 24* 33* 33*  CREATININE 0.78   < >  0.85 0.89 1.12* 1.15* 0.80  CALCIUM 8.4*  --   --  7.8* 7.3* 7.4* 8.1*   < > = values in this interval not displayed.   GFR: Estimated Creatinine Clearance: 49.2 mL/min (by C-G formula based on SCr of 0.8 mg/dL). Liver Function Tests: Recent Labs  Lab 02/04/20 1601  AST 19  ALT 12  ALKPHOS 87  BILITOT 0.3  PROT 7.6  ALBUMIN 3.5   No results for input(s): LIPASE, AMYLASE in the last 168 hours. No results for input(s): AMMONIA in the last 168 hours. Coagulation Profile: No results for input(s): INR, PROTIME in the last 168 hours. Cardiac Enzymes: Recent Labs  Lab 02/04/20 1601  CKTOTAL 410*   BNP (last 3 results) No results for input(s): PROBNP in the last 8760 hours. HbA1C: No results for input(s): HGBA1C in the last 72 hours. CBG: No results for input(s): GLUCAP in the last 168 hours. Lipid Profile: No results for input(s): CHOL, HDL, LDLCALC, TRIG, CHOLHDL,  LDLDIRECT in the last 72 hours. Thyroid Function Tests: No results for input(s): TSH, T4TOTAL, FREET4, T3FREE, THYROIDAB in the last 72 hours. Anemia Panel: No results for input(s): VITAMINB12, FOLATE, FERRITIN, TIBC, IRON, RETICCTPCT in the last 72 hours. Urine analysis:    Component Value Date/Time   COLORURINE AMBER (A) 08/07/2015 1020   APPEARANCEUR CLOUDY (A) 08/07/2015 1020   LABSPEC 1.021 08/07/2015 1020   PHURINE 6.0 08/07/2015 1020   GLUCOSEU NEGATIVE 08/07/2015 1020   HGBUR LARGE (A) 08/07/2015 1020   BILIRUBINUR MODERATE (A) 08/07/2015 1020   KETONESUR 15 (A) 08/07/2015 1020   PROTEINUR 100 (A) 08/07/2015 1020   UROBILINOGEN 2.0 (H) 03/07/2015 1832   NITRITE NEGATIVE 08/07/2015 1020   LEUKOCYTESUR LARGE (A) 08/07/2015 1020   Sepsis Labs: @LABRCNTIP (procalcitonin:4,lacticidven:4)  ) Recent Results (from the past 240 hour(s))  SARS Coronavirus 2 by RT PCR (hospital order, performed in [place] hospital lab) Nasopharyngeal Nasopharyngeal Swab     Status: None   Collection Time: 02/05/20  12:20 AM   Specimen: Nasopharyngeal Swab  Result Value Ref Range Status   SARS Coronavirus 2 NEGATIVE NEGATIVE Final    Comment: (NOTE) SARS-CoV-2 target nucleic acids are NOT DETECTED. The SARS-CoV-2 RNA is generally detectable in upper and lower respiratory specimens during the acute phase of infection. The lowest concentration of SARS-CoV-2 viral copies this assay can detect is 250 copies / mL. A negative result does not preclude SARS-CoV-2 infection and should not be used as the sole basis for treatment or other patient management decisions.  A negative result may occur with improper specimen collection / handling, submission of specimen other than nasopharyngeal swab, presence of viral mutation(s) within the areas targeted by this assay, and inadequate number of viral copies (<250 copies / mL). A negative result must be combined with clinical observations, patient history, and epidemiological information. Fact Sheet for Patients:   StrictlyIdeas.no Fact Sheet for Healthcare Providers: BankingDealers.co.za This test is not yet approved or cleared  by the [place] FDA and has been authorized for detection and/or diagnosis of SARS-CoV-2 by FDA under an Emergency Use Authorization (EUA).  This EUA will remain in effect (meaning this test can be used) for the duration of the COVID-19 declaration under Section 564(b)(1) of the Act, 21 U.S.C. section 360bbb-3(b)(1), unless the authorization is terminated or revoked sooner. Performed at [facility], [place] [address], [place], [place] [address]   MRSA PCR Screening     Status: None   Collection Time: 02/06/20  2:12 AM   Specimen: Nasal Mucosa; Nasopharyngeal  Result Value Ref Range Status   MRSA by PCR NEGATIVE NEGATIVE Final    Comment:        The GeneXpert MRSA Assay (FDA approved for NASAL specimens only), is one component of a comprehensive MRSA  colonization surveillance program. It is not intended to diagnose MRSA infection nor to guide or monitor treatment for MRSA infections. Performed at [facility], [place] [address] [place], [place] [address]       Studies: DG Chest Port 1 View  Result Date: 02/07/2020 CLINICAL DATA:  Acute respiratory failure with hypoxia. Pt with history of rheumatoid arthritis and scoliosis EXAM: PORTABLE CHEST 1 VIEW COMPARISON:  Chest radiograph 02/06/2020 FINDINGS: Evaluation limited by severe scoliosis. Stable cardiomediastinal contours. There are new heterogeneous opacities at the right lung base. Left lung appears clear. Old bilateral rib fractures. No evidence of pneumothorax or significant pleural effusion. Chronic deformities of the bilateral humeral heads. IMPRESSION: New  opacities at the right lung base, which may represent atelectasis and/or infection. Electronically Signed   By: Emmaline Kluver M.D.   On: 02/07/2020 16:08    Scheduled Meds: . sodium chloride   Intravenous Once  . buPROPion  150 mg Oral Daily  . chlorhexidine  15 mL Mouth Rinse BID  . Chlorhexidine Gluconate Cloth  6 each Topical Daily  . docusate sodium  100 mg Oral BID  . enoxaparin (LOVENOX) injection  30 mg Subcutaneous Q24H  . escitalopram  20 mg Oral Daily  . feeding supplement (ENSURE ENLIVE)  237 mL Oral TID BM  . ferrous sulfate  325 mg Oral Q breakfast  . ipratropium-albuterol  3 mL Nebulization TID  . mouth rinse  15 mL Mouth Rinse q12n4p  . predniSONE  5 mg Oral Daily  . senna  1 tablet Oral BID  . vitamin B-12  1,000 mcg Oral Daily    Continuous Infusions: . sodium chloride 75 mL/hr at 02/08/20 1237     LOS: 3 days     Briant Cedar, MD [facility]  If 7PM-7AM, please contact night-coverage [email or web address] 02/08/2020, 2:17 PM

## 2020-02-08 NOTE — Progress Notes (Signed)
Notified Lab that ABG being sent for analysis. 

## 2020-02-08 NOTE — TOC Progression Note (Addendum)
Transition of Care [facility]) - Progression Note    Patient Details  Name: Tara Brown MRN: [id number] Date of Birth: July 30, 1961  [facility] CM/SW Contact  Ida Rogue, [place] Phone Number: 02/08/2020, 12:22 PM  Clinical Narrative:  Contacted by MD who is wondering about status of patient with SNF.  Previous note indicates patient cannot return to previous SNF as she is not able to do upfront payment of co-pay.  Have reached out to [place] to find out about their requirement. TOC will continue to follow during the course of hospitalization.  Addendum:  [place] states they require up front payment as well.  Dr and patient alerted.     Expected Discharge Plan: Skilled Nursing Facility Barriers to Discharge: Continued Medical Work up  Expected Discharge Plan and Services Expected Discharge Plan: Skilled Nursing Facility In-house Referral: Clinical Social Work   Post Acute Care Choice: Skilled Nursing Facility Living arrangements for the past 2 months: Apartment                                       Social Determinants of Health (SDOH) Interventions    Readmission Risk Interventions No flowsheet data found.

## 2020-02-08 NOTE — Progress Notes (Signed)
History: Tara Brown a 60 y.o.femalewith medical history significant for rheumatoid arthritis with extensive scoliosis, major depressive disorder, hypertension, osteopenia, insomnia severe malnutrition presents to the hospitals/p fall, found down at home by health aide was on floor for over12hours. Patient states she had a mechanical fall. She notes no loc or head trauma , but s/p fall unable to get up and note b/l hip pain and back pain. In the ED, VSS, labs stable from prior. Xray showed right subtrochanteric femur fracture, left periprosthetic acetabular fracture.    Subjective: 3 Days Post-Op Procedure(s) (LRB): RIGHT HIP, INTRAMEDULLARY NAIL FIXATION (Right)  Patient feeling okay.  Pain controlled.  Bed exercises with therapy 02/07/2020.   Objective: Vital signs in last 24 hours: Temp:  [97.5 F (36.4 C)-98.4 F (36.9 C)] 97.5 F (36.4 C) (05/30 0544) Pulse Rate:  [57-82] 79 (05/30 0544) Resp:  [16-18] 18 (05/30 0544) BP: (97-116)/(67-79) 116/79 (05/30 0544) SpO2:  [71 %-99 %] 99 % (05/30 0544)  Intake/Output from previous day: 05/29 0701 - 05/30 0700 In: 2660 [P.O.:360; I.V.:2300] Out: 575 [Urine:575] Intake/Output this shift: No intake/output data recorded.  Recent Labs    02/06/20 0242 02/07/20 0415 02/08/20 0650  HGB 8.9* 9.5* 9.2*   Recent Labs    02/07/20 0415 02/08/20 0650  WBC 13.7* 11.2*  RBC 3.48* 3.35*  HCT 32.7* 31.8*  PLT 361 324   Recent Labs    02/07/20 0415 02/08/20 0650  NA 133* 138  K 4.7 5.4*  CL 98 105  CO2 23 26  BUN 33* 33*  CREATININE 1.15* 0.80  GLUCOSE 130* 111*  CALCIUM 7.4* 8.1*   No results for input(s): LABPT, INR in the last 72 hours.  Physical exam: General: Asleep on arrival.  Responds appropriately to questions. MSK: RLE dressings in place with scant stable drainage.  Sensation intact distally.  Dorsiflexion plantarflexion intact.  Assessment/Plan: 3 Days Post-Op Procedure(s) (LRB): RIGHT HIP,  INTRAMEDULLARY NAIL FIXATION (Right)  Right subtrochanteric femur fracture, left periprosthetic acetabular fracture. S/p IM nailing of right femur fracture 02/05/2020 and closed treatment of her left acetabular fracture by Dr Dion Saucier.    Weightbearing: NWB LLE.  PIVOT ONLY on her right leg for transfers.   Dressings: As needed if soiled. DVT prophylaxis: Lovenox  Dispo: Therapy and medicine evaluations ongoing.  Discharge when ready medically.  SNF planning in progress.    Lucretia Kern Martensen III 02/08/2020, 8:17 AM

## 2020-02-08 NOTE — Evaluation (Signed)
Clinical/Bedside Swallow Evaluation Patient Details  Name: Tara Brown MRN: [id number] Date of Birth: [birth year]  Today's Date: 02/08/2020 Time: SLP Start Time (ACUTE ONLY): 0930 SLP Stop Time (ACUTE ONLY): 0945 SLP Time Calculation (min) (ACUTE ONLY): 15 min  Past Medical History:  Past Medical History:  Diagnosis Date  . Anemia   . Arthritis   . Major depressive disorder, recurrent, severe without psychotic features ([place]) 03/11/2015  . Osteoporosis    Past Surgical History:  Past Surgical History:  Procedure Laterality Date  . AMPUTATION Left 12/07/2015   Procedure: AMPUTATION LEFT GREAT TOE;  Surgeon: Newt Minion, MD;  Location: [facility];  Service: Orthopedics;  Laterality: Left;  . FOOT SURGERY    . HAND SURGERY    . HIP FRACTURE SURGERY    . INTRAMEDULLARY (IM) NAIL INTERTROCHANTERIC Right 02/05/2020   Procedure: RIGHT HIP, INTRAMEDULLARY NAIL FIXATION;  Surgeon: Marchia Bond, MD;  Location: [facility]S;  Service: Orthopedics;  Laterality: Right;  . KNEE SURGERY    . NECK SURGERY     HPI:  59 year old female with PMH significant for scoliosis, RA, major depressive disorder, severe malnutrition, multiple L hip sx's due to dislocations of THA, toe amputation admitted after being found down at home by health aid (had been on floor >12 hours). Pt sustained right subtrochanteric femur fracture, s/p R hip IM nailing 5/27. Pt also has L periarthicular acetabular fracture being treated conservatively.  Dx include acute hypoxic respiratory failure, has weaned from BiPAP to [place].     Assessment / Plan / Recommendation Clinical Impression  Pt participated in clinical swallow assessment.  There were no focal deficits; oral mechanism is WNL.  Pt demonstrated adequate mastication of solids, brisk swallow response, and no s/s of aspiration.  Coordination of swallow and respiratory patterning was [facility].  No dysphagia identified.  Pt was set-up with breakfast tray; she ate minimally and then asked to  lie back down.  No SLP f/u is needed. Continue current diet.  SLP Visit Diagnosis: Dysphagia, unspecified (R13.10)    Aspiration Risk  No limitations    Diet Recommendation   regular solids, thin liquids  Medication Administration: Whole meds with liquid    Other  Recommendations Oral Care Recommendations: Oral care BID   Follow up Recommendations None        Swallow Study   General HPI: 59 year old female with PMH significant for scoliosis, RA, major depressive disorder, severe malnutrition, multiple L hip sx's due to dislocations of THA, toe amputation admitted after being found down at home by health aid (had been on floor >12 hours). Pt sustained right subtrochanteric femur fracture, s/p R hip IM nailing 5/27. Pt also has L periarthicular acetabular fracture being treated conservatively.  Dx include acute hypoxic respiratory failure, has weaned from BiPAP to [place].   Type of Study: Bedside Swallow Evaluation Previous Swallow Assessment: none per records Diet Prior to this Study: Regular;Thin liquids Temperature Spikes Noted: No Respiratory Status: Nasal cannula(3 liters) History of Recent Intubation: No Behavior/Cognition: Alert;Cooperative Oral Cavity Assessment: Within Functional Limits Oral Cavity - Dentition: Adequate natural dentition Vision: Functional for self-feeding Self-Feeding Abilities: Able to feed self;Needs set up Patient Positioning: Upright in bed Baseline Vocal Quality: Normal Volitional Cough: Weak Volitional Swallow: Able to elicit    Oral/Motor/Sensory Function Overall Oral Motor/Sensory Function: Within functional limits   Ice Chips Ice chips: Within functional limits   Thin Liquid Thin Liquid: Within functional limits    Nectar Thick Nectar Thick Liquid:  Not tested   Honey Thick Honey Thick Liquid: Not tested   Puree Puree: Within functional limits   Solid     Solid: Within functional limits      Blenda Mounts Laurice 02/08/2020,9:56  AM  Marchelle Folks L. Samson Frederic, MA CCC/SLP Acute Rehabilitation Services Office number [phone] Pager [phone]

## 2020-02-09 LAB — TYPE AND SCREEN
ABO/RH(D): O POS
Antibody Screen: NEGATIVE
Unit division: 0
Unit division: 0
Unit division: 0
Unit division: 0

## 2020-02-09 LAB — BPAM RBC
Blood Product Expiration Date: 202106262359
Blood Product Expiration Date: 202106272359
Blood Product Expiration Date: 202106282359
Blood Product Expiration Date: 202106282359
ISSUE DATE / TIME: 202105271318
ISSUE DATE / TIME: 202105271318
Unit Type and Rh: 5100
Unit Type and Rh: 5100
Unit Type and Rh: 5100
Unit Type and Rh: 5100

## 2020-02-09 LAB — CBC WITH DIFFERENTIAL/PLATELET
Abs Immature Granulocytes: 0.03 10*3/uL (ref 0.00–0.07)
Basophils Absolute: 0 10*3/uL (ref 0.0–0.1)
Basophils Relative: 0 %
Eosinophils Absolute: 0 10*3/uL (ref 0.0–0.5)
Eosinophils Relative: 1 %
HCT: 37.6 % (ref 36.0–46.0)
Hemoglobin: 11.1 g/dL — ABNORMAL LOW (ref 12.0–15.0)
Immature Granulocytes: 0 %
Lymphocytes Relative: 16 %
Lymphs Abs: 1.1 10*3/uL (ref 0.7–4.0)
MCH: 27.7 pg (ref 26.0–34.0)
MCHC: 29.5 g/dL — ABNORMAL LOW (ref 30.0–36.0)
MCV: 93.8 fL (ref 80.0–100.0)
Monocytes Absolute: 0.5 10*3/uL (ref 0.1–1.0)
Monocytes Relative: 8 %
Neutro Abs: 5.1 10*3/uL (ref 1.7–7.7)
Neutrophils Relative %: 75 %
Platelets: 258 10*3/uL (ref 150–400)
RBC: 4.01 MIL/uL (ref 3.87–5.11)
RDW: 17.7 % — ABNORMAL HIGH (ref 11.5–15.5)
WBC: 6.8 10*3/uL (ref 4.0–10.5)
nRBC: 0 % (ref 0.0–0.2)

## 2020-02-09 LAB — BLOOD GAS, ARTERIAL
Acid-Base Excess: 0.9 mmol/L (ref 0.0–2.0)
Bicarbonate: 27.3 mmol/L (ref 20.0–28.0)
FIO2: 40
O2 Saturation: 99.3 %
Patient temperature: 98.6
pCO2 arterial: 57.5 mmHg — ABNORMAL HIGH (ref 32.0–48.0)
pH, Arterial: 7.298 — ABNORMAL LOW (ref 7.350–7.450)
pO2, Arterial: 137 mmHg — ABNORMAL HIGH (ref 83.0–108.0)

## 2020-02-09 LAB — BASIC METABOLIC PANEL WITH GFR
Anion gap: 9 (ref 5–15)
BUN: 23 mg/dL — ABNORMAL HIGH (ref 6–20)
CO2: 25 mmol/L (ref 22–32)
Calcium: 8.2 mg/dL — ABNORMAL LOW (ref 8.9–10.3)
Chloride: 107 mmol/L (ref 98–111)
Creatinine, Ser: 0.7 mg/dL (ref 0.44–1.00)
GFR calc Af Amer: >60 mL/min
GFR calc non Af Amer: >60 mL/min
Glucose, Bld: 95 mg/dL (ref 70–99)
Potassium: 4.8 mmol/L (ref 3.5–5.1)
Sodium: 141 mmol/L (ref 135–145)

## 2020-02-09 LAB — LACTIC ACID, PLASMA: Lactic Acid, Venous: 0.7 mmol/L (ref 0.5–1.9)

## 2020-02-09 MED ORDER — IPRATROPIUM-ALBUTEROL 0.5-2.5 (3) MG/3ML IN SOLN
3.0000 mL | Freq: Two times a day (BID) | RESPIRATORY_TRACT | Status: DC
  Administered 2020-02-10 – 2020-02-16 (×13): 3 mL via RESPIRATORY_TRACT
  Filled 2020-02-09 (×13): qty 3

## 2020-02-09 MED ORDER — ALBUTEROL SULFATE (2.5 MG/3ML) 0.083% IN NEBU: 2.5000 mg | INHALATION_SOLUTION | RESPIRATORY_TRACT | Status: DC | PRN

## 2020-02-09 MED ORDER — LISINOPRIL 10 MG PO TABS
10.0000 mg | ORAL_TABLET | Freq: Every day | ORAL | Status: DC
  Administered 2020-02-09 – 2020-02-10 (×2): 10 mg via ORAL
  Filled 2020-02-09 (×2): qty 1

## 2020-02-09 NOTE — Progress Notes (Signed)
PROGRESS NOTE  Tara Brown MPN:[id number] DOB: February 15, 1961 DOA: 02/04/2020 PCP: [name], [name]  HPI/Recap of past 24 hours: HPI from Dr Silas Flood Catterton is a 59 y.o. female with medical history significant for rheumatoid arthritis with extensive scoliosis, major depressive disorder, hypertension, osteopenia, insomnia severe malnutrition presents to the hospital s/p fall, found down at home by health aide was on floor for over 12 hours.  Patient states she had a mechanical fall. She notes no loc or head trauma , but s/p fall unable to get up and note b/l hip pain and back pain. In the ED, VSS, labs stable from prior. Xray showed right subtrochanteric femur fracture, left periprosthetic acetabular fracture.  Orthopedics consulted.  Patient admitted for further management.     Today, patient noted to be pretty drowsy on BiPAP, but easily arousable.  Denied any new complaints.  Wants to get off BiPAP.     Assessment/Plan: Active Problems:   Hip fx (HCC)   Closed right hip fracture (HCC)   Pressure injury of skin   Protein-calorie malnutrition, severe   Right subtrochanteric femur fracture, left periprosthetic acetabular fracture s/p right hip intramedullary nail fixation on 02/05/2020 by Dr Dion Saucier Orthopedics on board Of note patient has complicated history of recent repair of fracture on the left at [facility], orthopedics on board, plan to consult her orthopedic surgeon at [facility] Pain management, DVT ppx per orthopedics PT/OT s/p surgery  Poor urine output/Acute urinary retention UO improving slowly s/p IVF Difficult Foley placement by urology, draining about 800 cc of urine on 02/06/2020 Continue IV fluids, continue Foley  Acute hypoxic/hypercarbic respiratory failure Currently afebrile, with leukocytosis (on steroids) Noted increased work of breathing, has been requiring pain medication ABG showed pH 7.24, PO2 83.6, PCO2 64.3, repeat improved mildly Chest x-ray  showed new opacities at right lung base, possible atelectasis and/or infection Start BiPAP, transfer to SDU, supplemental O2 prn, incentive spirometry Monitor closely  ABLA/Iron def anemia/B12 deficiency Baseline hemoglobin around 8 Received 2 units of blood on 02/05/2020 Anemia panel showed iron 11, sats 4%, vitamin B12 198 S/P one dose of Feraheme on 02/05/2020, continue vitamin B12 and oral iron supplementation Daily CBC  Rheumatoid arthritis/extensive scoliosis/osteopenia Chronic debility , multiple fall with fracture Hold MTX per guidelines, continue prednisone, weekly Fosamax  Leukocytosis likely 2/2 prednisone, afebrile  Hypertension BP uncontrolled Restart home lisinopril at a lower dose  Depression Continue home Lexapro, Wellbutrin  Severe malnutrition Dietitian consult placed        Malnutrition Type:  Nutrition Problem: Severe Malnutrition Etiology: chronic illness(RA and scoliosis)   Malnutrition Characteristics:  Signs/Symptoms: moderate fat depletion, moderate muscle depletion, severe fat depletion, severe muscle depletion   Nutrition Interventions:  Interventions: Ensure Enlive (each supplement provides 350kcal and 20 grams of protein), Snacks    Estimated body mass index is 15.78 kg/m as calculated from the following:   Height as of this encounter: 5\' 4"  (1.626 m).   Weight as of this encounter: 41.7 kg.     Code Status: Full  Family Communication: Discussed extensively with patient  Disposition Plan: Status is: Inpatient  Remains inpatient appropriate because:Inpatient level of care appropriate due to severity of illness   Dispo: The patient is from: Home              Anticipated d/c is to: Home, unable to afford SNF              Anticipated d/c date is: 2 days  Patient currently is not medically stable to d/c.    Consultants:  Orthopedics  Procedures:  None  Antimicrobials:  None  DVT  prophylaxis: Lovenox   Objective: Vitals:   02/09/20 1132 02/09/20 1200 02/09/20 1300 02/09/20 1407  BP:  (!) 160/94 (!) 168/82 (!) 157/92  Pulse: 76 68 80 72  Resp: (!) 25 16 (!) 26 (!) 22  Temp:      TempSrc:      SpO2: 100% 100% 100% 100%  Weight:      Height:        Intake/Output Summary (Last 24 hours) at 02/09/2020 1521 Last data filed at 02/09/2020 1100 Gross per 24 hour  Intake 480 ml  Output 300 ml  Net 180 ml   Filed Weights   02/04/20 1531 02/05/20 1151 02/06/20 0246  Weight: 47 kg 47 kg 41.7 kg    Exam:  General: NAD, chronically ill-appearing, cachectic, scoliosis noted  Cardiovascular: S1, S2 present  Respiratory:  Diminished breath sounds bilaterally  Abdomen: Soft, nontender, nondistended, bowel sounds present  Musculoskeletal: No bilateral pedal edema noted, + joint deformity upper and lower extremities with noted contractures  Skin: Normal  Psychiatry: Normal mood   Data Reviewed: CBC: Recent Labs  Lab 02/04/20 1601 02/05/20 0134 02/05/20 0442 02/06/20 0242 02/07/20 0415 02/08/20 0650 02/09/20 0256  WBC 10.3   < > 8.0 8.4 13.7* 11.2* 6.8  NEUTROABS 8.0*  --   --  7.6 11.2* 8.6* 5.1  HGB 8.4*   < > 7.2* 8.9* 9.5* 9.2* 11.1*  HCT 27.4*   < > 23.6* 29.2* 32.7* 31.8* 37.6  MCV 88.4   < > 88.7 91.3 94.0 94.9 93.8  PLT 370   < > 316 240 361 324 258   < > = values in this interval not displayed.   Basic Metabolic Panel: Recent Labs  Lab 02/05/20 0442 02/06/20 0242 02/07/20 0415 02/08/20 0650 02/09/20 0256  NA 134* 134* 133* 138 141  K 4.0 4.7 4.7 5.4* 4.8  CL 96* 100 98 105 107  CO2 24 24 23 26 25   GLUCOSE 96 224* 130* 111* 95  BUN 19 24* 33* 33* 23*  CREATININE 0.89 1.12* 1.15* 0.80 0.70  CALCIUM 7.8* 7.3* 7.4* 8.1* 8.2*   GFR: Estimated Creatinine Clearance: 49.2 mL/min (by C-G formula based on SCr of 0.7 mg/dL). Liver Function Tests: Recent Labs  Lab 02/04/20 1601  AST 19  ALT 12  ALKPHOS 87  BILITOT 0.3  PROT 7.6   ALBUMIN 3.5   No results for input(s): LIPASE, AMYLASE in the last 168 hours. No results for input(s): AMMONIA in the last 168 hours. Coagulation Profile: No results for input(s): INR, PROTIME in the last 168 hours. Cardiac Enzymes: Recent Labs  Lab 02/04/20 1601  CKTOTAL 410*   BNP (last 3 results) No results for input(s): PROBNP in the last 8760 hours. HbA1C: No results for input(s): HGBA1C in the last 72 hours. CBG: No results for input(s): GLUCAP in the last 168 hours. Lipid Profile: No results for input(s): CHOL, HDL, LDLCALC, TRIG, CHOLHDL, LDLDIRECT in the last 72 hours. Thyroid Function Tests: No results for input(s): TSH, T4TOTAL, FREET4, T3FREE, THYROIDAB in the last 72 hours. Anemia Panel: No results for input(s): VITAMINB12, FOLATE, FERRITIN, TIBC, IRON, RETICCTPCT in the last 72 hours. Urine analysis:    Component Value Date/Time   COLORURINE AMBER (A) 08/07/2015 1020   APPEARANCEUR CLOUDY (A) 08/07/2015 1020   LABSPEC 1.021 08/07/2015 1020   PHURINE 6.0 08/07/2015  1020   GLUCOSEU NEGATIVE 08/07/2015 1020   HGBUR LARGE (A) 08/07/2015 1020   BILIRUBINUR MODERATE (A) 08/07/2015 1020   KETONESUR 15 (A) 08/07/2015 1020   PROTEINUR 100 (A) 08/07/2015 1020   UROBILINOGEN 2.0 (H) 03/07/2015 1832   NITRITE NEGATIVE 08/07/2015 1020   LEUKOCYTESUR LARGE (A) 08/07/2015 1020   Sepsis Labs: @LABRCNTIP (procalcitonin:4,lacticidven:4)  ) Recent Results (from the past 240 hour(s))  SARS Coronavirus 2 by RT PCR (hospital order, performed in [facility] hospital lab) Nasopharyngeal Nasopharyngeal Swab     Status: None   Collection Time: 02/05/20 12:20 AM   Specimen: Nasopharyngeal Swab  Result Value Ref Range Status   SARS Coronavirus 2 NEGATIVE NEGATIVE Final    Comment: (NOTE) SARS-CoV-2 target nucleic acids are NOT DETECTED. The SARS-CoV-2 RNA is generally detectable in upper and lower respiratory specimens during the acute phase of infection. The  lowest concentration of SARS-CoV-2 viral copies this assay can detect is 250 copies / mL. A negative result does not preclude SARS-CoV-2 infection and should not be used as the sole basis for treatment or other patient management decisions.  A negative result may occur with improper specimen collection / handling, submission of specimen other than nasopharyngeal swab, presence of viral mutation(s) within the areas targeted by this assay, and inadequate number of viral copies (<250 copies / mL). A negative result must be combined with clinical observations, patient history, and epidemiological information. Fact Sheet for Patients:   02/07/20 Fact Sheet for Healthcare Providers: BoilerBrush.com.cy This test is not yet approved or cleared  by the https://pope.com/ FDA and has been authorized for detection and/or diagnosis of SARS-CoV-2 by FDA under an Emergency Use Authorization (EUA).  This EUA will remain in effect (meaning this test can be used) for the duration of the COVID-19 declaration under Section 564(b)(1) of the Act, 21 U.S.C. section 360bbb-3(b)(1), unless the authorization is terminated or revoked sooner. Performed at [facility], [address], [place], [place]   MRSA PCR Screening     Status: None   Collection Time: 02/06/20  2:12 AM   Specimen: Nasal Mucosa; Nasopharyngeal  Result Value Ref Range Status   MRSA by PCR NEGATIVE NEGATIVE Final    Comment:        The GeneXpert MRSA Assay (FDA approved for NASAL specimens only), is one component of a comprehensive MRSA colonization surveillance program. It is not intended to diagnose MRSA infection nor to guide or monitor treatment for MRSA infections. Performed at [facility], [address], [place], [place]       Studies: No results found.  Scheduled Meds: . buPROPion  150 mg Oral Daily  .  chlorhexidine  15 mL Mouth Rinse BID  . Chlorhexidine Gluconate Cloth  6 each Topical Daily  . docusate sodium  100 mg Oral BID  . enoxaparin (LOVENOX) injection  30 mg Subcutaneous Q24H  . escitalopram  20 mg Oral Daily  . feeding supplement (ENSURE ENLIVE)  237 mL Oral TID BM  . ferrous sulfate  325 mg Oral Q breakfast  . [START ON 02/10/2020] ipratropium-albuterol  3 mL Nebulization BID  . mouth rinse  15 mL Mouth Rinse q12n4p  . predniSONE  5 mg Oral Daily  . senna  1 tablet Oral BID  . sodium zirconium cyclosilicate  10 g Oral Daily  . vitamin B-12  1,000 mcg Oral Daily    Continuous Infusions: . sodium chloride 75 mL/hr at 02/08/20 1831  LOS: 4 days     Briant Cedar, MD [facility]  If 7PM-7AM, please contact night-coverage [email or web address] 02/09/2020, 3:21 PM

## 2020-02-09 NOTE — Progress Notes (Signed)
Pt currently off BIPAP and tolerating well.  BIPAP not indicated at this time, RT to monitor and assess as needed.

## 2020-02-09 NOTE — Progress Notes (Signed)
Orthopedic Progress Note:  Subjective: 4 Days Post-Op Procedure(s) (LRB): RIGHT HIP, INTRAMEDULLARY NAIL FIXATION (Right)  Transferred to SDU yesterday 02/08/20 afternoon dt declining respiratory status.  Now on Bipap.   Objective: Vital signs in last 24 hours: Temp:  [97.6 F (36.4 C)-98.3 F (36.8 C)] 97.8 F (36.6 C) (05/31 0259) Pulse Rate:  [62-84] 78 (05/31 0743) Resp:  [16-27] 27 (05/31 0743) BP: (126-164)/(79-122) 161/95 (05/31 0700) SpO2:  [94 %-100 %] 100 % (05/31 0743) FiO2 (%):  [40 %] 40 % (05/31 0743)  Intake/Output from previous day: 05/30 0701 - 05/31 0700 In: 120 [P.O.:120] Out: 500 [Urine:500] Intake/Output this shift: No intake/output data recorded.  Recent Labs    02/07/20 0415 02/08/20 0650 02/09/20 0256  HGB 9.5* 9.2* 11.1*   Recent Labs    02/08/20 0650 02/09/20 0256  WBC 11.2* 6.8  RBC 3.35* 4.01  HCT 31.8* 37.6  PLT 324 258   Recent Labs    02/08/20 0650 02/09/20 0256  NA 138 141  K 5.4* 4.8  CL 105 107  CO2 26 25  BUN 33* 23*  CREATININE 0.80 0.70  GLUCOSE 111* 95  CALCIUM 8.1* 8.2*   No results for input(s): LABPT, INR in the last 72 hours.  Physical exam: General: NAD, Bipap in place. MSK: RLE dressings in place with scant stable drainage.  Sensation intact distally.  Dorsiflexion plantarflexion intact.  Feet warm. +DP pulse.  Assessment/Plan: 4 Days Post-Op Procedure(s) (LRB): RIGHT HIP, INTRAMEDULLARY NAIL FIXATION (Right)  Right subtrochanteric femur fracture, left periprosthetic acetabular fracture. S/p IM nailing of right femur fracture 02/05/2020 and closed treatment of her left acetabular fracture by Dr Dion Saucier.    Weightbearing: NWB LLE.  PIVOT ONLY on her right leg for transfers.   Dressings: As needed if soiled. DVT prophylaxis: Lovenox  Dispo: Inpatient.   Tara Brown 02/09/2020, 8:27 AM

## 2020-02-10 ENCOUNTER — Inpatient Hospital Stay (HOSPITAL_COMMUNITY): Payer: Medicare HMO

## 2020-02-10 DIAGNOSIS — Z7189 Other specified counseling: Secondary | ICD-10-CM

## 2020-02-10 DIAGNOSIS — J9601 Acute respiratory failure with hypoxia: Secondary | ICD-10-CM

## 2020-02-10 DIAGNOSIS — S72001A Fracture of unspecified part of neck of right femur, initial encounter for closed fracture: Secondary | ICD-10-CM

## 2020-02-10 DIAGNOSIS — Z515 Encounter for palliative care: Secondary | ICD-10-CM

## 2020-02-10 DIAGNOSIS — R0902 Hypoxemia: Secondary | ICD-10-CM

## 2020-02-10 LAB — CBC WITH DIFFERENTIAL/PLATELET
Abs Immature Granulocytes: 0.02 10*3/uL (ref 0.00–0.07)
Basophils Absolute: 0 10*3/uL (ref 0.0–0.1)
Basophils Relative: 0 %
Eosinophils Absolute: 0.1 10*3/uL (ref 0.0–0.5)
Eosinophils Relative: 1 %
HCT: 33.7 % — ABNORMAL LOW (ref 36.0–46.0)
Hemoglobin: 9.6 g/dL — ABNORMAL LOW (ref 12.0–15.0)
Immature Granulocytes: 0 %
Lymphocytes Relative: 14 %
Lymphs Abs: 1.1 10*3/uL (ref 0.7–4.0)
MCH: 27.7 pg (ref 26.0–34.0)
MCHC: 28.5 g/dL — ABNORMAL LOW (ref 30.0–36.0)
MCV: 97.4 fL (ref 80.0–100.0)
Monocytes Absolute: 0.6 10*3/uL (ref 0.1–1.0)
Monocytes Relative: 8 %
Neutro Abs: 5.7 10*3/uL (ref 1.7–7.7)
Neutrophils Relative %: 77 %
Platelets: 366 10*3/uL (ref 150–400)
RBC: 3.46 MIL/uL — ABNORMAL LOW (ref 3.87–5.11)
RDW: 17.6 % — ABNORMAL HIGH (ref 11.5–15.5)
WBC: 7.4 10*3/uL (ref 4.0–10.5)
nRBC: 0 % (ref 0.0–0.2)

## 2020-02-10 LAB — BASIC METABOLIC PANEL
Anion gap: 7 (ref 5–15)
BUN: 18 mg/dL (ref 6–20)
CO2: 29 mmol/L (ref 22–32)
Calcium: 8.2 mg/dL — ABNORMAL LOW (ref 8.9–10.3)
Chloride: 105 mmol/L (ref 98–111)
Creatinine, Ser: 0.51 mg/dL (ref 0.44–1.00)
GFR calc Af Amer: >60 mL/min (ref >60)
GFR calc non Af Amer: >60 mL/min (ref >60)
Glucose, Bld: 118 mg/dL — ABNORMAL HIGH (ref 70–99)
Potassium: 4.8 mmol/L (ref 3.5–5.1)
Sodium: 141 mmol/L (ref 135–145)

## 2020-02-10 LAB — BLOOD GAS, VENOUS
Acid-Base Excess: 4.3 mmol/L — ABNORMAL HIGH (ref 0.0–2.0)
Bicarbonate: 31 mmol/L — ABNORMAL HIGH (ref 20.0–28.0)
O2 Saturation: 82.8 %
Patient temperature: 98.6
pCO2, Ven: 61.9 mmHg — ABNORMAL HIGH (ref 44.0–60.0)
pH, Ven: 7.32 (ref 7.250–7.430)
pO2, Ven: 50.1 mmHg — ABNORMAL HIGH (ref 32.0–45.0)

## 2020-02-10 MED ORDER — HYDRALAZINE HCL 20 MG/ML IJ SOLN
10.0000 mg | Freq: Three times a day (TID) | INTRAMUSCULAR | Status: DC | PRN
  Administered 2020-02-10 – 2020-02-12 (×3): 10 mg via INTRAVENOUS
  Filled 2020-02-10 (×2): qty 1

## 2020-02-10 MED ORDER — LORAZEPAM 2 MG/ML IJ SOLN
0.5000 mg | Freq: Once | INTRAMUSCULAR | Status: AC
  Administered 2020-02-10: 0.5 mg via INTRAVENOUS
  Filled 2020-02-10: qty 1

## 2020-02-10 MED ORDER — HYDROMORPHONE HCL 1 MG/ML IJ SOLN
INTRAMUSCULAR | Status: AC
  Filled 2020-02-10: qty 1

## 2020-02-10 MED ORDER — SODIUM CHLORIDE 0.9 % IV SOLN
2.0000 g | Freq: Two times a day (BID) | INTRAVENOUS | Status: DC
  Administered 2020-02-10 – 2020-02-13 (×7): 2 g via INTRAVENOUS
  Filled 2020-02-10 (×7): qty 2

## 2020-02-10 MED ORDER — METRONIDAZOLE IN NACL 5-0.79 MG/ML-% IV SOLN
500.0000 mg | Freq: Three times a day (TID) | INTRAVENOUS | Status: DC
  Administered 2020-02-10 – 2020-02-14 (×12): 500 mg via INTRAVENOUS
  Filled 2020-02-10 (×12): qty 100

## 2020-02-10 MED ORDER — FUROSEMIDE 10 MG/ML IJ SOLN
40.0000 mg | Freq: Once | INTRAMUSCULAR | Status: AC
  Administered 2020-02-10: 40 mg via INTRAVENOUS
  Filled 2020-02-10: qty 4

## 2020-02-10 MED ORDER — HYDROMORPHONE HCL 1 MG/ML IJ SOLN
0.5000 mg | INTRAMUSCULAR | Status: AC | PRN
  Administered 2020-02-10: 0.5 mg via INTRAVENOUS

## 2020-02-10 MED ORDER — LISINOPRIL 20 MG PO TABS
20.0000 mg | ORAL_TABLET | Freq: Every day | ORAL | Status: DC
  Administered 2020-02-11 – 2020-02-13 (×3): 20 mg via ORAL
  Filled 2020-02-10 (×3): qty 2
  Filled 2020-02-10 (×3): qty 1
  Filled 2020-02-10: qty 2

## 2020-02-10 NOTE — Progress Notes (Signed)
Physical Therapy Treatment Patient Details Name: Tara Brown MRN: [id number] DOB: Jan 10, 1961 Today's Date: 02/10/2020    History of Present Illness Patient is a 59 year old female with PMH significant for scoliosis, RA, multiple L hip sx's due to dislocations of THA, toe amputation admitted after a fall. Patient is s/p R hip IM nailing, pt also has L periarthicular acetabular fracture being treated conservatively. Pt had declining resp status on 5/31 requiring bipap and was transferred to stepdown.    PT Comments    Pt was agreeable to therapy today.  She required mod A for transfer to EOB and with limited tolerance for sitting EOB. Pt requiring 10 L HFNC and with transfer back to supine RR up to 41 requiring cues for relaxation.  Pt required cues for transfers and exercise technique.   Pt with gradual progress today.  Cont POC.    Follow Up Recommendations  SNF;Supervision/Assistance - 24 hour     Equipment Recommendations  None recommended by PT    Recommendations for Other Services       Precautions / Restrictions Precautions Precautions: Posterior Hip(on L - assumption from prior dislocations) Precaution Comments: hip abduction pillow for comfort, S/P multiple dislocations lt hip, last surgery for constrained liner. Restrictions RLE Weight Bearing: Weight bearing as tolerated LLE Weight Bearing: Weight bearing as tolerated Other Position/Activity Restrictions: R LE WBAT for pivots only    Mobility  Bed Mobility Overal bed mobility: Needs Assistance Bed Mobility: Supine to Sit;Sit to Supine     Supine to sit: Mod assist Sit to supine: Mod assist   General bed mobility comments: Pt able to initiate leg movements and reaching for therapist but required mod A to complete transfer  Transfers                 General transfer comment: refused  Ambulation/Gait                 Stairs             Wheelchair Mobility    Modified Rankin (Stroke  Patients Only)       Balance Overall balance assessment: Needs assistance Sitting-balance support: Bilateral upper extremity supported;Feet supported Sitting balance-Leahy Scale: Poor Sitting balance - Comments: Requiring min-mod A for balance.  Only sat 3-5 minutes before requesting to lay down and initiating laying down.  Unable to scoot.                                    Cognition Arousal/Alertness: Lethargic Behavior During Therapy: Anxious Overall Cognitive Status: No family/caregiver present to determine baseline cognitive functioning                                 General Comments: Pt lethargic but arousable.      Exercises General Exercises - Lower Extremity Ankle Circles/Pumps: AROM;10 reps;Supine Long Arc Quad: AROM;Both;15 reps;Seated Heel Slides: AAROM;Right;AROM;Left;10 reps;Supine    General Comments General comments (skin integrity, edema, etc.): Pt on 10 L HFNC with O2 sats 98% or >.  RR 30 at EOB but up to 41 when transferred back to supine requiring cues for deep slow breaths and able to return to 28-30 breaths/min.  BP and HR stable.      Pertinent Vitals/Pain Pain Assessment: Faces Faces Pain Scale: Hurts little more Pain Location: bil legs with movement Pain  Descriptors / Indicators: Discomfort;Grimacing Pain Intervention(s): Limited activity within patient's tolerance;Monitored during session;Repositioned;Relaxation    Home Living                      Prior Function            PT Goals (current goals can now be found in the care plan section) Progress towards PT goals: Progressing toward goals    Frequency    Min 3X/week      PT Plan Current plan remains appropriate    Co-evaluation              AM-PAC PT "6 Clicks" Mobility   Outcome Measure  Help needed turning from your back to your side while in a flat bed without using bedrails?: A Lot Help needed moving from lying on your back to  sitting on the side of a flat bed without using bedrails?: A Lot Help needed moving to and from a bed to a chair (including a wheelchair)?: Total Help needed standing up from a chair using your arms (e.g., wheelchair or bedside chair)?: Total Help needed to walk in hospital room?: Total Help needed climbing 3-5 steps with a railing? : Total 6 Click Score: 8    End of Session   Activity Tolerance: Patient limited by fatigue Patient left: in bed;with call bell/phone within reach;with bed alarm set Nurse Communication: Mobility status(request for yougart and juice) PT Visit Diagnosis: Unsteadiness on feet (R26.81);Difficulty in walking, not elsewhere classified (R26.2);Repeated falls (R29.6);Pain Pain - Right/Left: Right Pain - part of body: Leg     Time: 1610-1630 PT Time Calculation (min) (ACUTE ONLY): 20 min  Charges:  $Therapeutic Activity: 8-22 mins                     Maggie Font, PT Acute Rehab Services Pager [phone] [facility] [phone] [facility] 02/10/2020, 4:54 PM

## 2020-02-10 NOTE — Progress Notes (Addendum)
PROGRESS NOTE  Tara Brown IRC:[id number] DOB: 10/13/1960 DOA: 02/04/2020 PCP: Iva Lento, [name]  HPI/Recap of past 24 hours: HPI from Dr Alroy Bailiff [name] is a 59 y.o. female with medical history significant for rheumatoid arthritis with extensive scoliosis, major depressive disorder, hypertension, osteopenia, insomnia severe malnutrition presents to the hospital s/p fall, found down at home by health aide was on floor for over 12 hours.  Patient states she had a mechanical fall. She notes no loc or head trauma , but s/p fall unable to get up and note b/l hip pain and back pain. In the ED, VSS, labs stable from prior. Xray showed right subtrochanteric femur fracture, left periprosthetic acetabular fracture.  Orthopedics consulted.  Patient admitted for further management.    Early this morning, patient remove the BiPAP, stating that she wants to die, insisted on being switched to a DNR, requesting for pain medication and to be left alone to die comfortably.  Patient noted to be frustrated overall with her clinical progress, patient is basically unable to sit upright, basically lays flat for everything, including eating.  Not able to perform ADLs adequately in part due to significant body deformity.  Noted to have increased work of breathing, constantly requesting for pain/anxiety medication, although constantly noted to be sleepy/drowsy for the most part of the day and not eating adequately.  Also noted to have intermittent poor urine output as well.  Patient's respiratory status is progressively getting worse.  Patient denies any chest pain, abdominal pain, nausea/vomiting, fever/chills.     Assessment/Plan: Active Problems:   Hip fx (HCC)   Closed right hip fracture (HCC)   Pressure injury of skin   Protein-calorie malnutrition, severe   Right subtrochanteric femur fracture, left periprosthetic acetabular fracture s/p right hip intramedullary nail fixation on 02/05/2020 by  Dr Mardelle Matte Orthopedics on board Of note patient has complicated history of recent repair of fracture on the left at [facility], orthopedics on board, plan to consult her orthopedic surgeon at [facility] management, DVT ppx per orthopedics PT/OT s/p surgery  Acute hypoxic/hypercarbic respiratory failure likely 2/2 HCAP/aspiration PNA Currently afebrile, with no leukocytosis (although on steroids) Noted increased work of breathing Initial ABG showed pH 7.24, PO2 83.6, PCO2 64.3 Last chest x-ray done on 02/10/2020 showed full interstitial edema with additional right lung opacity suspicious for pneumonia SLP evaluated on 02/08/2020, recommended regular solids, thin liquids Start IV cefepime, metronidazole Received 1 dose of IV Lasix 40 mg on 02/10/2020, may repeat daily prn Continue BiPAP nightly, and nap times (currently refusing for continuous BiPAP), supplemental O2 as needed, duo nebs, incentive spirometry Monitor closely  Poor urine output/Acute urinary retention UO improved s/p IVF Difficult Foley placement by urology, draining about 800 cc of urine on 02/06/2020 Continue IV fluids, continue Foley Urology recommended to attempt voiding trial as an outpatient in their office after discharge  ABLA/Iron def anemia/B12 deficiency Baseline hemoglobin around 8 Received 2 units of blood on 02/05/2020 Anemia panel showed iron 11, sats 4%, vitamin B12 198 Continue vitamin B12 and oral iron supplementation Daily CBC  Rheumatoid arthritis/extensive scoliosis/osteopenia Chronic debility , multiple fall with fracture Hold MTX per guidelines, continue prednisone, weekly Fosamax  Hypertension BP uncontrolled Restart home lisinopril at a lower dose at 20 mg daily, may go up to home dose of 40 mg pending BP  Depression Continue home Lexapro, Wellbutrin  Severe malnutrition Dietitian consult placed  Goals of care Pt stating that she wants to die, insisted on being switched to a  DNR, requesting for  pain medication and to be left alone to die comfortably, noted very poor prognosis, with multiple fracture, advanced rheumatoid arthritis, severe scoliosis, with now respiratory failure.  Patient made DNR Palliative consult pending        Malnutrition Type:  Nutrition Problem: Severe Malnutrition Etiology: chronic illness(RA and scoliosis)   Malnutrition Characteristics:  Signs/Symptoms: moderate fat depletion, moderate muscle depletion, severe fat depletion, severe muscle depletion   Nutrition Interventions:  Interventions: Ensure Enlive (each supplement provides 350kcal and 20 grams of protein), Snacks    Estimated body mass index is 15.78 kg/m as calculated from the following:   Height as of this encounter: 5\' 4"  (1.626 m).   Weight as of this encounter: 41.7 kg.     Code Status: Full  Family Communication: Discussed extensively with patient  Disposition Plan: Status is: Inpatient  Remains inpatient appropriate because:Inpatient level of care appropriate due to severity of illness   Dispo: The patient is from: Home              Anticipated d/c is to: Home, unable to afford SNF              Anticipated d/c date is: 2 days              Patient currently is not medically stable to d/c. due to increased O2 requirement, requiring intermittent BiPAP, IV antibiotics    Consultants:  Orthopedics  Procedures:  None  Antimicrobials:  Cefepime  Metronidazole  DVT prophylaxis: Lovenox   Objective: Vitals:   02/10/20 0400 02/10/20 0740 02/10/20 0800 02/10/20 1200  BP: (!) 166/81  (!) 162/87 (!) 168/100  Pulse: 78 82 81 78  Resp: (!) 27 (!) 27 (!) 29 (!) 26  Temp:   (!) 97.5 F (36.4 C)   TempSrc:   Axillary   SpO2: 96% 95% 98% 100%  Weight:      Height:        Intake/Output Summary (Last 24 hours) at 02/10/2020 1449 Last data filed at 02/10/2020 1239 Gross per 24 hour  Intake 2104.04 ml  Output 2300 ml  Net -195.96 ml   Filed Weights   02/04/20  1531 02/05/20 1151 02/06/20 0246  Weight: 47 kg 47 kg 41.7 kg    Exam:  General: NAD, chronically ill-appearing, cachectic, scoliosis noted  Cardiovascular: S1, S2 present  Respiratory:  Diminished breath sounds bilaterally, increased work of breathing  Abdomen: Soft, nontender, nondistended, bowel sounds present  Musculoskeletal: No bilateral pedal edema noted, + joint deformity upper and lower extremities with noted contractures  Skin: Normal  Psychiatry:  Fair mood   Data Reviewed: CBC: Recent Labs  Lab 02/06/20 0242 02/07/20 0415 02/08/20 0650 02/09/20 0256 02/10/20 0244  WBC 8.4 13.7* 11.2* 6.8 7.4  NEUTROABS 7.6 11.2* 8.6* 5.1 5.7  HGB 8.9* 9.5* 9.2* 11.1* 9.6*  HCT 29.2* 32.7* 31.8* 37.6 33.7*  MCV 91.3 94.0 94.9 93.8 97.4  PLT 240 361 324 258 366   Basic Metabolic Panel: Recent Labs  Lab 02/06/20 0242 02/07/20 0415 02/08/20 0650 02/09/20 0256 02/10/20 0244  NA 134* 133* 138 141 141  K 4.7 4.7 5.4* 4.8 4.8  CL 100 98 105 107 105  CO2 24 23 26 25 29   GLUCOSE 224* 130* 111* 95 118*  BUN 24* 33* 33* 23* 18  CREATININE 1.12* 1.15* 0.80 0.70 0.51  CALCIUM 7.3* 7.4* 8.1* 8.2* 8.2*   GFR: Estimated Creatinine Clearance: 49.2 mL/min (by C-G formula based on SCr of  0.51 mg/dL). Liver Function Tests: Recent Labs  Lab 02/04/20 1601  AST 19  ALT 12  ALKPHOS 87  BILITOT 0.3  PROT 7.6  ALBUMIN 3.5   No results for input(s): LIPASE, AMYLASE in the last 168 hours. No results for input(s): AMMONIA in the last 168 hours. Coagulation Profile: No results for input(s): INR, PROTIME in the last 168 hours. Cardiac Enzymes: Recent Labs  Lab 02/04/20 1601  CKTOTAL 410*   BNP (last 3 results) No results for input(s): PROBNP in the last 8760 hours. HbA1C: No results for input(s): HGBA1C in the last 72 hours. CBG: No results for input(s): GLUCAP in the last 168 hours. Lipid Profile: No results for input(s): CHOL, HDL, LDLCALC, TRIG, CHOLHDL, LDLDIRECT in  the last 72 hours. Thyroid Function Tests: No results for input(s): TSH, T4TOTAL, FREET4, T3FREE, THYROIDAB in the last 72 hours. Anemia Panel: No results for input(s): VITAMINB12, FOLATE, FERRITIN, TIBC, IRON, RETICCTPCT in the last 72 hours. Urine analysis:    Component Value Date/Time   COLORURINE AMBER (A) 08/07/2015 1020   APPEARANCEUR CLOUDY (A) 08/07/2015 1020   LABSPEC 1.021 08/07/2015 1020   PHURINE 6.0 08/07/2015 1020   GLUCOSEU NEGATIVE 08/07/2015 1020   HGBUR LARGE (A) 08/07/2015 1020   BILIRUBINUR MODERATE (A) 08/07/2015 1020   KETONESUR 15 (A) 08/07/2015 1020   PROTEINUR 100 (A) 08/07/2015 1020   UROBILINOGEN 2.0 (H) 03/07/2015 1832   NITRITE NEGATIVE 08/07/2015 1020   LEUKOCYTESUR LARGE (A) 08/07/2015 1020   Sepsis Labs: @LABRCNTIP (procalcitonin:4,lacticidven:4)  ) Recent Results (from the past 240 hour(s))  SARS Coronavirus 2 by RT PCR (hospital order, performed in [facility] hospital lab) Nasopharyngeal Nasopharyngeal Swab     Status: None   Collection Time: 02/05/20 12:20 AM   Specimen: Nasopharyngeal Swab  Result Value Ref Range Status   SARS Coronavirus 2 NEGATIVE NEGATIVE Final    Comment: (NOTE) SARS-CoV-2 target nucleic acids are NOT DETECTED. The SARS-CoV-2 RNA is generally detectable in upper and lower respiratory specimens during the acute phase of infection. The lowest concentration of SARS-CoV-2 viral copies this assay can detect is 250 copies / mL. A negative result does not preclude SARS-CoV-2 infection and should not be used as the sole basis for treatment or other patient management decisions.  A negative result may occur with improper specimen collection / handling, submission of specimen other than nasopharyngeal swab, presence of viral mutation(s) within the areas targeted by this assay, and inadequate number of viral copies (<250 copies / mL). A negative result must be combined with clinical observations, patient history, and  epidemiological information. Fact Sheet for Patients:   02/07/20 Fact Sheet for Healthcare Providers: BoilerBrush.com.cy This test is not yet approved or cleared  by the https://pope.com/ FDA and has been authorized for detection and/or diagnosis of SARS-CoV-2 by FDA under an Emergency Use Authorization (EUA).  This EUA will remain in effect (meaning this test can be used) for the duration of the COVID-19 declaration under Section 564(b)(1) of the Act, 21 U.S.C. section 360bbb-3(b)(1), unless the authorization is terminated or revoked sooner. Performed at [facility], [address], [place], [place]   MRSA PCR Screening     Status: None   Collection Time: 02/06/20  2:12 AM   Specimen: Nasal Mucosa; Nasopharyngeal  Result Value Ref Range Status   MRSA by PCR NEGATIVE NEGATIVE Final    Comment:        The GeneXpert MRSA Assay (FDA approved for NASAL specimens only), is  one component of a comprehensive MRSA colonization surveillance program. It is not intended to diagnose MRSA infection nor to guide or monitor treatment for MRSA infections. Performed at [facility], [address], [place], [place] [address]       Studies: DG Chest Port 1 View  Result Date: 02/10/2020 CLINICAL DATA:  59 year old female with hypoxia. Recent surgical repair of right femur fracture. Chronic scoliosis. EXAM: PORTABLE CHEST 1 VIEW COMPARISON:  02/07/2020 portable chest and earlier. FINDINGS: Portable AP semi upright view at 0822 hours. Severe dextroconvex thoracic scoliosis again noted. Chronic rib and proximal humerus fractures. Progressive increased right lung opacity, and increased bibasilar density. Stable cardiac size and mediastinal contours. No pneumothorax. Increased pulmonary vascularity. Visualized tracheal air column is within normal limits. IMPRESSION: 1. Increasing bilateral lung base and  additional indistinct right lung opacity. This probably in part reflects bilateral pleural effusions, and consider interstitial edema. Although the additional right lung opacity is also suspicious for pneumonia. 2. Severe scoliosis, and chronic skeletal trauma. Electronically Signed   By: Odessa Fleming M.D.   On: 02/10/2020 08:36    Scheduled Meds: . buPROPion  150 mg Oral Daily  . chlorhexidine  15 mL Mouth Rinse BID  . Chlorhexidine Gluconate Cloth  6 each Topical Daily  . docusate sodium  100 mg Oral BID  . enoxaparin (LOVENOX) injection  30 mg Subcutaneous Q24H  . escitalopram  20 mg Oral Daily  . feeding supplement (ENSURE ENLIVE)  237 mL Oral TID BM  . ferrous sulfate  325 mg Oral Q breakfast  . ipratropium-albuterol  3 mL Nebulization BID  . lisinopril  10 mg Oral Daily  . mouth rinse  15 mL Mouth Rinse q12n4p  . predniSONE  5 mg Oral Daily  . senna  1 tablet Oral BID  . vitamin B-12  1,000 mcg Oral Daily    Continuous Infusions: . ceFEPime (MAXIPIME) IV Stopped (02/10/20 1145)  . metronidazole Stopped (02/10/20 1145)     LOS: 5 days     Briant Cedar, MD [facility]  If 7PM-7AM, please contact night-coverage [email or web address] 02/10/2020, 2:49 PM

## 2020-02-10 NOTE — Consult Note (Signed)
Palliative care consult note  Reason for consult: Goals of care in light of advanced scoliosis and rheumatoid arthritis with recent hip fracture and worsening respiratory status  Palliative care consult received.  Chart reviewed including personal review of pertinent labs and imaging.  Briefly, [name] is a-year-old female with past medical history of rheumatoid arthritis, extensive scoliosis, major depression, hypertension, osteopenia and insomnia who presented to the hospital status post fall after being found by home health aide after being on floor for over 12 hours.  She reports a mechanical fall with no loss of consciousness or head trauma but could not get up due to bilateral hip and back pain.  She was found to have right subtrochanteric femur fracture and left periprosthetic acetabular fracture.  Ortho was consulted and she had IM nail placed.  Over the last couple of days, her respiratory status has been worsening and she has been transition to stepdown unit with intermittent need for BiPAP.  She has been taking off BiPAP and says she wants to die.  Palliative consulted for goals of care.   [name] has been stating that she is ready to die and only wants medications to treat her pain.  I attempted to discuss with her today and she did endorse this, however, she was sleepy after receiving medications.  With stimulation and continued questioning, she reports that she is "tired."  I asked her to clarify this and she did report that she is, "ready to go."  I asked her if by this she meant that she was ready to die and she stated yes.  She states that she has continuous pain and shortness of breath if she is not given as much medication to make her sleep.  I asked for permission to call her family to further discuss goals moving forward and she gave consent for me to call her father.  I called and was able to reach patient's father, Tara Brown.  Mr. Vanderhoff reports being updated by bedside RN  earlier today and he is familiar with Lou[place]ina situation.  We reviewed her clinical course this admission including admission for fracture, surgery, and worsening respiratory status with concern for pneumonia and initiation of antibiotics today.  Abbe Amsterdam reports that he and Johnae sisters have been trying to talk with her about her long-term goals as she had stated before that she felt she was getting weaker and less able to live independently.  Phil reports that Roy had stated her worst nightmare would be to end up in a Medicaid nursing home being forced to share room with another person.  He states she has always been very private and appreciates being able to be on her own.  We talked about statements that Terrel had made to other staff regarding being ready to die.  He reports that he understands and he wants to be able to do what ever is the best option to support Jency and her desires for care moving forward.  He is certainly open to further conversation and would like to participate in goals of care conversation if Janaiah is more awake and able to do so tomorrow.  We have plan for another family meeting sometime between 31 and 10:00 tomorrow morning.  Discussed care plan at this point in time will be continuation of current interventions without escalating to heroic interventions in the event of cardiac or respiratory arrest.  He expressed understanding and agreed with this care plan.  Discussed that if she improves overnight, we  will talk further with Samarra about her wishes for her long-term care plan moving forward tomorrow morning.  If, however, she worsens despite current medical interventions, I discussed with Michele Mcalpine that with likely be looking at an end-of-life situation regardless of interventions moving forward.  He reported being comfortable with a plan to continue with current interventions and reassess her situation tomorrow morning.  He also agreed that if she does decompensate, we  should do her best to ensure that Cissy does not suffer.  -DNR/DNI -Continue symptom management as needed for pain, anxiety, and shortness of breath. -Plan for family meeting to try to further clarify goals of care tomorrow between 9 and 10 in the morning.  Hopefully, she will be more able to interact and participate in conversation at that time.  Also planning to call her father and he will participate in family meeting via phone.  Total time: 80 minutes  Greater than 50%  of this time was spent counseling and coordinating care related to the above assessment and plan.   Romie Minus, MD [facility] Palliative Medicine Team [phone]

## 2020-02-10 NOTE — Progress Notes (Signed)
Subjective: 5 Days Post-Op s/p Procedure(s): RIGHT HIP, INTRAMEDULLARY NAIL FIXATION   Patient laying comfortably in bed. Drifts into and out of sleep while speaking with me. Reports pain to be a 7 this morning, but states that she had just received pain medication. Complains of shortness of breath, denies chest pain, nausea or vomiting. Transferred back to stepdown unit 02/08/20 due to declining respiratory status, put on BiPAP, off BiPAP this morning.  Objective:  PE: VITALS:   Vitals:   02/10/20 0000 02/10/20 0200 02/10/20 0300 02/10/20 0400  BP: (!) 141/79 (!) 135/120  (!) 166/81  Pulse: 93 81  78  Resp: (!) 26 (!) 26  (!) 27  Temp:   97.6 F (36.4 C)   TempSrc:   Oral   SpO2: 100% 93%  96%  Weight:      Height:       General: somnolent on exam, in no acute distress, on nasal cannula.  MSK: RLE dressings in place with scant drainage. Dorsiflexion and plantarflexion intact bilaterally. Intact DP pulses bilaterally. Distal sensation intact bilaterally.    LABS  Results for orders placed or performed during the hospital encounter of 02/04/20 (from the past 24 hour(s))  Blood gas, arterial     Status: Abnormal   Collection Time: 02/09/20  7:50 AM  Result Value Ref Range   FIO2 40.00    pH, Arterial 7.298 (L) 7.350 - 7.450   pCO2 arterial 57.5 (H) 32.0 - 48.0 mmHg   pO2, Arterial 137 (H) 83.0 - 108.0 mmHg   Bicarbonate 27.3 20.0 - 28.0 mmol/L   Acid-Base Excess 0.9 0.0 - 2.0 mmol/L   O2 Saturation 99.3 %   Patient temperature 98.6   CBC with Differential/Platelet     Status: Abnormal   Collection Time: 02/10/20  2:44 AM  Result Value Ref Range   WBC 7.4 4.0 - 10.5 K/uL   RBC 3.46 (L) 3.87 - 5.11 MIL/uL   Hemoglobin 9.6 (L) 12.0 - 15.0 g/dL   HCT 33.7 (L) 36.0 - 46.0 %   MCV 97.4 80.0 - 100.0 fL   MCH 27.7 26.0 - 34.0 pg   MCHC 28.5 (L) 30.0 - 36.0 g/dL   RDW 17.6 (H) 11.5 - 15.5 %   Platelets 366 150 - 400 K/uL   nRBC 0.0 0.0 - 0.2 %   Neutrophils Relative %  77 %   Neutro Abs 5.7 1.7 - 7.7 K/uL   Lymphocytes Relative 14 %   Lymphs Abs 1.1 0.7 - 4.0 K/uL   Monocytes Relative 8 %   Monocytes Absolute 0.6 0.1 - 1.0 K/uL   Eosinophils Relative 1 %   Eosinophils Absolute 0.1 0.0 - 0.5 K/uL   Basophils Relative 0 %   Basophils Absolute 0.0 0.0 - 0.1 K/uL   Immature Granulocytes 0 %   Abs Immature Granulocytes 0.02 0.00 - 0.07 K/uL  Basic metabolic panel     Status: Abnormal   Collection Time: 02/10/20  2:44 AM  Result Value Ref Range   Sodium 141 135 - 145 mmol/L   Potassium 4.8 3.5 - 5.1 mmol/L   Chloride 105 98 - 111 mmol/L   CO2 29 22 - 32 mmol/L   Glucose, Bld 118 (H) 70 - 99 mg/dL   BUN 18 6 - 20 mg/dL   Creatinine, Ser 0.51 0.44 - 1.00 mg/dL   Calcium 8.2 (L) 8.9 - 10.3 mg/dL   GFR calc non Af Amer >60 >60 mL/min   GFR  calc Af Amer >60 >60 mL/min   Anion gap 7 5 - 15    No results found.  Assessment/Plan: Active Problems:   Hip fx (HCC)   Closed right hip fracture (HCC)   Pressure injury of skin   Protein-calorie malnutrition, severe  Right subtrochanteric femur fracture, left periprosthetic acetabular fracture End stage rheumatoid arthritis  - 5 days post-op right hip IM nail - spoke with arthroplasty revision specialist and ortho trauma specialist and both recommended non-operative management of acetabular fracture Weightbearing: Pivot transfer only RLE, NWB LLE Insicional and dressing care: PRN dressing changes, appear stable this morning VTE prophylaxis: lovenox Pain control: continue current regimen Follow - up plan: Follow-up with Dr. Dion Saucier in 2 weeks Dispo: Ideally would be discharged to SNF, but per [facility] notes - patient has not had 60 days of wellness since her previous discharge from SNF, so she will be in copay days and will need to pay 14-20 days upfront. Patient cannot afford this upfront payment. Patient will likely have to be discharged home with home health.    Armida Sans 02/10/2020, 7:15 AM   Contact  information:   Weekdays [address], [name] [phone] After hours and holidays please check [email or web address] for group call information for [facility]

## 2020-02-10 NOTE — Progress Notes (Signed)
Discussed with Dr. Sharolyn Douglas about patient's foley catheter and criteria to d/c it. Urology placed and wants to take out when they see patient as OP in their clinic.  Orders to continue foley to be placed by Dr. Sharolyn Douglas and will continue to monitor.

## 2020-02-10 NOTE — Progress Notes (Signed)
  Patient only wore BIPAP for 4 hours last night. Early this morning she removed the BIPAP herself and told me that she wanted to die. She would not allow me to put on her Jew[place] for a few minutes and quickly desaturated to the 70's. She insists that she wants to be a DNR  And placed on pain medication and left alone to die comfortably. The charge nurse and NP on call heard her say this as well. I was given a one time pain medication dose.

## 2020-02-10 NOTE — Progress Notes (Signed)
Pt. placed on per RN request per report on pt. 02/09/2020 7p/order currently states PRN, RT placed per request, pt. tolerating fair.

## 2020-02-10 NOTE — Progress Notes (Signed)
Pharmacy Antibiotic Note  Tara Brown is a 59 y.o. female with hx RA and scoliosis presented to the ED on 02/04/2020 with s/p fall at home. She underwent right hip intramedullary nail fixation on 5/27. CXR on 5/28 showed "new opacities at the right lung base, which may represent atelectasis and/or infection." Pharmacy has been consulted on 6/1 to dose cefepime for suspected PNA.  Plan: - cefepime 2gm IV q12h  ___________________________________  Height: 5\' 4"  (162.6 cm) Weight: 41.7 kg (91 lb 14.9 oz) IBW/kg (Calculated) : 54.7  Temp (24hrs), Avg:97.5 F (36.4 C), Min:97.4 F (36.3 C), Max:97.6 F (36.4 C)  Recent Labs  Lab 02/06/20 0242 02/07/20 0415 02/08/20 0650 02/09/20 0256 02/10/20 0244  WBC 8.4 13.7* 11.2* 6.8 7.4  CREATININE 1.12* 1.15* 0.80 0.70 0.51  LATICACIDVEN  --   --   --  0.7  --     Estimated Creatinine Clearance: 49.2 mL/min (by C-G formula based on SCr of 0.51 mg/dL).    No Known Allergies   Thank you for allowing pharmacy to be a part of this patient's care.  04/11/20 02/10/2020 8:30 AM

## 2020-02-11 DIAGNOSIS — S32302A Unspecified fracture of left ilium, initial encounter for closed fracture: Secondary | ICD-10-CM

## 2020-02-11 DIAGNOSIS — W19XXXA Unspecified fall, initial encounter: Secondary | ICD-10-CM

## 2020-02-11 LAB — CBC WITH DIFFERENTIAL/PLATELET
Abs Immature Granulocytes: 0.03 10*3/uL (ref 0.00–0.07)
Basophils Absolute: 0 10*3/uL (ref 0.0–0.1)
Basophils Relative: 0 %
Eosinophils Absolute: 0.1 10*3/uL (ref 0.0–0.5)
Eosinophils Relative: 1 %
HCT: 33.9 % — ABNORMAL LOW (ref 36.0–46.0)
Hemoglobin: 10 g/dL — ABNORMAL LOW (ref 12.0–15.0)
Immature Granulocytes: 0 %
Lymphocytes Relative: 13 %
Lymphs Abs: 1.2 10*3/uL (ref 0.7–4.0)
MCH: 27.8 pg (ref 26.0–34.0)
MCHC: 29.5 g/dL — ABNORMAL LOW (ref 30.0–36.0)
MCV: 94.2 fL (ref 80.0–100.0)
Monocytes Absolute: 0.8 10*3/uL (ref 0.1–1.0)
Monocytes Relative: 9 %
Neutro Abs: 6.9 10*3/uL (ref 1.7–7.7)
Neutrophils Relative %: 77 %
Platelets: 424 10*3/uL — ABNORMAL HIGH (ref 150–400)
RBC: 3.6 MIL/uL — ABNORMAL LOW (ref 3.87–5.11)
RDW: 17.6 % — ABNORMAL HIGH (ref 11.5–15.5)
WBC: 9 10*3/uL (ref 4.0–10.5)
nRBC: 0 % (ref 0.0–0.2)

## 2020-02-11 LAB — BASIC METABOLIC PANEL
Anion gap: 10 (ref 5–15)
BUN: 19 mg/dL (ref 6–20)
CO2: 33 mmol/L — ABNORMAL HIGH (ref 22–32)
Calcium: 8.2 mg/dL — ABNORMAL LOW (ref 8.9–10.3)
Chloride: 96 mmol/L — ABNORMAL LOW (ref 98–111)
Creatinine, Ser: 0.58 mg/dL (ref 0.44–1.00)
GFR calc Af Amer: >60 mL/min (ref >60)
GFR calc non Af Amer: >60 mL/min (ref >60)
Glucose, Bld: 122 mg/dL — ABNORMAL HIGH (ref 70–99)
Potassium: 3.6 mmol/L (ref 3.5–5.1)
Sodium: 139 mmol/L (ref 135–145)

## 2020-02-11 MED ORDER — HYDROCODONE-ACETAMINOPHEN 10-325 MG PO TABS
1.0000 | ORAL_TABLET | ORAL | Status: DC | PRN
  Administered 2020-02-11 – 2020-02-18 (×34): 1 via ORAL
  Filled 2020-02-11 (×35): qty 1

## 2020-02-11 MED ORDER — HYDROCODONE-ACETAMINOPHEN 5-325 MG PO TABS: 1.0000 | ORAL_TABLET | ORAL | Status: DC | PRN

## 2020-02-11 MED ORDER — LIP MEDEX EX OINT
TOPICAL_OINTMENT | CUTANEOUS | Status: AC
  Filled 2020-02-11: qty 7

## 2020-02-11 NOTE — Progress Notes (Signed)
Daily Progress Note   Patient Name: Tara Brown       Date: 02/11/2020 DOB: [birth year]  Age: 59 y.o. MRN#: [id number] Attending Physician: Georgette Shell, MD Primary Care Physician: Iva Lento, [name] Admit Date: 02/04/2020  Reason for Consultation/Follow-up: Establishing goals of care  Subjective: I met today with Tara Brown.  Her father joined [place] for meeting via phone.  She lays in bed with her eyes closed for most of the encounter, but she listens and responds appropriately to questions when asked directly.  She often will respond in a few word answers but does not elaborate unless specifically asked to do so.  Jourdin, Gens stated to multiple people that she only wanted medication to help control her pain and she wanted to die.  Today, she is talking more about her hope of being able to return to her home, "for a little while."  She states that her main concern is making sure her pain is better controlled.  She reports she has been miserable over the last couple of days and she does not feel that her pain is being adequately controlled.  We discussed the risk and benefit of opioid pain medications and she acknowledges risk associated with increased dosage of pain medications.  At the same time, she indicates that her pain is worse control than it is at home.  I reviewed her PDMP which reveals that her home dosage of Norco is 10/325 and she is currently getting 5/325 as needed every 4 hours.  We also discussed natural progression of her chronic underlying illnesses that are not going to go away and decline she has had in her nutrition and functional status.  Overall, she reports she is "getting tired" and her body is "coming to an end."  She appears frustrated today as she  states there are "no good options" for discharge as she does not have finances to go back to skilled facility for rehab.  She states that she wants to go back to her home and is hopeful that her sisters could help her manage in conjunction with aides that she has for a few hours a day.  She would like to have hospice involved in her care on discharge if she is able to transition back to home.  At this point, she and her father  agree that the best plan moving forward would be to continue with current interventions and see how she does with her clinical course of the next couple of days.  We discussed that she has a guarded prognosis and I am not sure how things will continue to progress based upon her respiratory status.  She does appear to be improved today, and I do not think that she is at a point where she would qualify for residential hospice at this time.  This is certainly something that can be reassessed based upon her clinical course of the next day or 2.  Length of Stay: 6  Current Medications: Scheduled Meds:  . buPROPion  150 mg Oral Daily  . chlorhexidine  15 mL Mouth Rinse BID  . Chlorhexidine Gluconate Cloth  6 each Topical Daily  . docusate sodium  100 mg Oral BID  . enoxaparin (LOVENOX) injection  30 mg Subcutaneous Q24H  . escitalopram  20 mg Oral Daily  . feeding supplement (ENSURE ENLIVE)  237 mL Oral TID BM  . ferrous sulfate  325 mg Oral Q breakfast  . ipratropium-albuterol  3 mL Nebulization BID  . lisinopril  20 mg Oral Daily  . mouth rinse  15 mL Mouth Rinse q12n4p  . predniSONE  5 mg Oral Daily  . senna  1 tablet Oral BID  . vitamin B-12  1,000 mcg Oral Daily    Continuous Infusions: . ceFEPime (MAXIPIME) IV Stopped (02/11/20 9735)  . metronidazole 500 mg (02/11/20 1011)    PRN Meds: acetaminophen, albuterol, alum & mag hydroxide-simeth, bisacodyl, hydrALAZINE, HYDROcodone-acetaminophen, magnesium citrate, menthol-cetylpyridinium **OR** phenol, ondansetron  **OR** ondansetron (ZOFRAN) IV, polyethylene glycol, zolpidem  Physical Exam        General: Frail and chronically ill-appearing, cachectic with severe scoliosis noted Heart: Regular rate and rhythm. No murmur appreciated. Lungs: Tachypnea, decreased air movement Abdomen: Soft, nontender, nondistended, positive bowel sounds.  Ext: No significant edema, joint deformity in upper and lower extremities with noted contracture and RA nodules Skin: Warm and dry Neuro: Grossly intact, nonfocal.   Vital Signs: BP (!) 160/81 (BP Location: Left Arm)   Pulse 84   Temp (!) 97 F (36.1 C) (Axillary)   Resp (!) 26   Ht 5' 4"  (1.626 m)   Wt 41.7 kg   SpO2 100%   BMI 15.78 kg/m  SpO2: SpO2: 100 % O2 Device: O2 Device: High Flow Nasal Cannula O2 Flow Rate: O2 Flow Rate (L/min): 8 L/min  Intake/output summary:   Intake/Output Summary (Last 24 hours) at 02/11/2020 1050 Last data filed at 02/11/2020 0900 Gross per 24 hour  Intake 1123.68 ml  Output 2850 ml  Net -1726.32 ml   LBM: Last BM Date: 02/09/20 Baseline Weight: Weight: 47 kg Most recent weight: Weight: 41.7 kg   Flowsheet Rows     Most Recent Value  Intake Tab  Referral Department  Hospitalist  Unit at Time of Referral  Intermediate Care Unit  Palliative Care Primary Diagnosis  Trauma  Date Notified  02/10/20  Palliative Care Type  New Palliative care  Reason for referral  Clarify Goals of Care  Date of Admission  02/04/20  Date first seen by Palliative Care  02/10/20  # of days Palliative referral response time  0 Day(s)  # of days IP prior to Palliative referral  6  Clinical Assessment  Palliative Performance Scale Score  40%  Psychosocial & Spiritual Assessment  Palliative Care Outcomes  Patient/Family meeting held?  Yes  Who  was at the meeting?  Patient, Father via phone      Patient Active Problem List   Diagnosis Date Noted  . Pressure injury of skin 02/06/2020  . Protein-calorie malnutrition, severe 02/06/2020   . Hip fx ([place]) 02/05/2020  . Closed right hip fracture ([place]) 02/05/2020  . MDD (major depressive disorder) 02/17/2017  . Osteoporosis 05/29/2016  . HTN (hypertension) 05/29/2016  . MDD (major depressive disorder), recurrent severe, without psychosis ([place]) 05/28/2016  . Rheumatoid arthritis ([place]) 12/06/2015  . Anemia of chronic disease 03/08/2015    Palliative Care Assessment & Plan   Patient Profile:  Recommendations/Plan: Pain: She reports that uncontrolled pain is her main concern moving forward.  She does lay in bed with her eyes closed throughout the encounter and does appear to drift off at times, however, she is easily aroused and per her father I am not sure that she is too far from her baseline in regard to mental status.  Currently, she has Norco 5/325 ordered every 4 hours as needed.  I reviewed her PDMP and her home dose is Norco 10/325 3 times daily as needed.  She reports her #1 goal at this point is to have her pain controlled, even to the point of requesting only comfort care if this is what is necessary to control her pain.  As she is acutely postop from IM nail placement and still has hip fracture, I think it is reasonable to reincrease her dose of Norco back to her home dose of 10 mg with increased frequency of every 4 hours as needed.   Goals of care: [place], [place] was requesting to be given only medication for pain and to die.  Today, she reports that she hopes she may get well enough to go back to her home.  She does not think that she is going to continue to improve long-term moving forward and she states that her overall goal is to continue to focus on being out of pain and hopefully getting out of the hospital.  There continues to be a large concern about care plan if she reaches a point where she is stable enough to discharge as she cannot afford to go back to rehab and is concerned about being home in her current state.  If she does transition home, she would like to  enroll in hospice if she is able to assist her with symptom management home and avoid future hospitalizations as she continues to decline in her functional status and nutrition.  She is certainly still at risk for further decompensation and/or death this hospitalization, but she appears to be a little improved from my encounter with her yesterday.  I discussed with her and her father regarding plan for continuation of current care, better control of her pain, and following her clinical course.  If she worsens, plan will likely be for transitioning to comfort care.  If she improves, we will need to figure out best disposition option, likely with the inclusion of hospice services if she is not going to be able to go for rehab. Her overall goal is to focus on being comfortable and having good quality of life, however, she does appear to be somewhat improved today.  Continue to follow clinical course for another 24 to 48 hours and progress conversation about goals of care based upon how she does over the next couple of days.    Code Status:    Code Status Orders  (From admission, onward)  Start     Ordered   02/10/20 0816  Do not attempt resuscitation (DNR)  Continuous    Question Answer Comment  In the event of cardiac or respiratory ARREST Do not call a "code blue"   In the event of cardiac or respiratory ARREST Do not perform Intubation, CPR, defibrillation or ACLS   In the event of cardiac or respiratory ARREST Use medication by any route, position, wound care, and other measures to relive pain and suffering. May use oxygen, suction and manual treatment of airway obstruction as needed for comfort.      02/10/20 0815            Prognosis:  Guarded  Discharge Planning: To Be Determined  Care plan was discussed with patient, father  Thank you for allowing the Palliative Medicine Team to assist in the care of this patient.   Time In: 0950 Time Out: 1030 Total Time 40 Prolonged  Time Billed No      Greater than 50%  of this time was spent counseling and coordinating care related to the above assessment and plan.  Micheline Rough, MD  Please contact Palliative Medicine Team phone at [phone] for questions and concerns.

## 2020-02-11 NOTE — Progress Notes (Addendum)
Physical Therapy Treatment Patient Details Name: Tara Brown MRN: [id number] DOB: 1961/06/29 Today's Date: 02/11/2020    History of Present Illness Patient is a 59 year old female with PMH significant for scoliosis, RA, multiple L hip sx's due to dislocations of THA, toe amputation admitted after a fall. Patient is s/p R hip IM nailing, pt also has L periarthicular acetabular fracture being treated conservatively. Pt had declining resp status on 5/31 requiring bipap and was transferred to stepdown.    PT Comments    Pt with limited activity tolerance; requiring +2 total assist for bed mobility, likely limited rehab potential.  Continue to recommend SNF, if pt transitions to hospice, PT will sign off; VSS during session today, on 5L HFNC  Follow Up Recommendations  SNF;Supervision/Assistance - 24 hour     Equipment Recommendations  None recommended by PT    Recommendations for Other Services       Precautions / Restrictions Precautions Precautions: Posterior Hip Restrictions RLE Weight Bearing: Weight bearing as tolerated LLE Weight Bearing: Weight bearing as tolerated Other Position/Activity Restrictions: R LE WBAT for pivots only    Mobility  Bed Mobility Overal bed mobility: Needs Assistance Bed Mobility: Supine to Sit;Sit to Supine     Supine to sit: +2 for safety/equipment;+2 for physical assistance;Max assist Sit to supine: +2 for physical assistance;+2 for safety/equipment;Total assist   General bed mobility comments: bed pad used to assist with lateral scooting and transition to sit;  assist  with LEs  and trunk  and LEs in both directions  Transfers                 General transfer comment: too fatigued   Ambulation/Gait                 Stairs             Wheelchair Mobility    Modified Rankin (Stroke Patients Only)       Balance     Sitting balance-Leahy Scale: Zero Sitting balance - Comments: requiring assist to maintain  upright (bed pad used as support to keep pt in midline) pt able to reach and hold ensure to drink  while on EOB                                    Cognition Arousal/Alertness: Awake/alert Behavior During Therapy: [facility] for tasks assessed/performed;Flat affect Overall Cognitive Status: No family/caregiver present to determine baseline cognitive functioning                                 General Comments: sleepy but easily arousable        Exercises      General Comments        Pertinent Vitals/Pain Pain Assessment: 0-10 Faces Pain Scale: Hurts little more Pain Location: bil legs with movement Pain Descriptors / Indicators: Discomfort;Grimacing Pain Intervention(s): Monitored during session;Limited activity within patient's tolerance    Home Living                      Prior Function            PT Goals (current goals can now be found in the care plan section) Acute Rehab PT Goals Patient Stated Goal: go to rehab PT Goal Formulation: With patient Time For Goal Achievement: 02/20/20 Potential to  Achieve Goals: Fair Progress towards PT goals: Progressing toward goals    Frequency    Min 3X/week      PT Plan Current plan remains appropriate    Co-evaluation              AM-PAC PT "6 Clicks" Mobility   Outcome Measure  Help needed turning from your back to your side while in a flat bed without using bedrails?: A Lot Help needed moving from lying on your back to sitting on the side of a flat bed without using bedrails?: A Lot Help needed moving to and from a bed to a chair (including a wheelchair)?: Total Help needed standing up from a chair using your arms (e.g., wheelchair or bedside chair)?: Total Help needed to walk in hospital room?: Total Help needed climbing 3-5 steps with a railing? : Total 6 Click Score: 8    End of Session   Activity Tolerance: Patient limited by fatigue Patient left: in bed;with call  bell/phone within reach;with bed alarm set   PT Visit Diagnosis: Unsteadiness on feet (R26.81);Difficulty in walking, not elsewhere classified (R26.2);Repeated falls (R29.6);Pain Pain - Right/Left: Right Pain - part of body: Leg     Time: 4536-4680 PT Time Calculation (min) (ACUTE ONLY): 15 min  Charges:  $Therapeutic Activity: 8-22 mins                     Baxter Flattery, PT   Acute Rehab Dept [facility]): [phone]   02/11/2020    [facility] 02/11/2020, 1:18 PM

## 2020-02-11 NOTE — Progress Notes (Signed)
OT Cancellation Note  Patient Details Name: Tara Brown MRN: [id number] DOB: Jan 23, 1961   Cancelled Treatment:    Reason Eval/Treat Not Completed: Other (comment) Palliative consult reviewed in chart, appears that patient cannot afford discharge to SNF and if transitioning home will have hospice services with home health aides/family support. At this time patient's main goal is pain control/being comfortable. Will discontinue acute OT services at this time. Please re-consult if new needs arise.   Carmelia Roller 02/11/2020, 12:22 PM

## 2020-02-11 NOTE — Progress Notes (Signed)
PROGRESS NOTE    Tara Brown  XAJ:[id number] DOB: December 01, 1960 DOA: 02/04/2020 PCP: [name], [name]    Brief Narrative:HPI from Dr Silas Flood Hocottis a 59 y.o.femalewith medical history significant for rheumatoid arthritis with extensive scoliosis, major depressive disorder, hypertension, osteopenia, insomnia severe malnutrition presents to the hospitals/p fall, found down at home by health aide was on floor for over12hours. Patient states she had a mechanical fall. She notes no loc or head trauma , but s/p fall unable to get up and note b/l hip pain and back pain. In the ED, VSS, labs stable from prior. Xray showed right subtrochanteric femur fracture, left periprosthetic acetabular fracture.  Orthopedics consulted.  Patient admitted for further management.  Assessment & Plan:   Active Problems:   Hip fx (HCC)   Closed right hip fracture (HCC)   Pressure injury of skin   Protein-calorie malnutrition, severe  #1 right subtrochanteric femur fracture and left periprosthetic acetabular fracture status post right hip IM nail 02/05/2020 by Dr. Dion Saucier.  Ortho following.  #2 acute urinary retention improving after IV fluids and difficult Foley placement by urology  #3 rheumatoid arthritis/extensive scoliosis/osteopenia multiple falls with fracture on prednisone and weekly Fosamax.  Methotrexate on hold.  #4 acute hypoxic hypercapnic respiratory failure-patient appears tachypneic though she reports her breathing is better. Chest x-ray showed new opacities at the right lung base atelectasis versus infection. Was on BiPAP currently on nasal cannula at 8 L saturating 100%.  #5 Abla/B12/iron deficiency Baseline hemoglobin approximately around 8.  She received 2 units of blood transfusion on 02/05/2020 after surgery.  Status post Feraheme 1 dose 02/05/2020.  Continue oral iron and B12 supplementation.  Hemoglobin today is 10.  #6 essential hypertension on lisinopril at home  blood pressure 160/81  #7 depression on Lexapro and Wellbutrin  #8 severe malnutrition follow dietary recommendations  #9 goals of care appreciate palliative input.  Palliative care to discuss with patient's family and patient today.  #10 right and left sacrum stage I pressure injury present on admission continue local wound care.  Pressure Injury 02/06/20 Sacrum Left;Right Stage 1 -  Intact skin with non-blanchable redness of a localized area usually over a bony prominence. (Active)  02/06/20 0235  Location: Sacrum  Location Orientation: Left;Right  Staging: Stage 1 -  Intact skin with non-blanchable redness of a localized area usually over a bony prominence.  Wound Description (Comments):   Present on Admission: Yes      Nutrition Problem: Severe Malnutrition Etiology: chronic illness(RA and scoliosis)     Signs/Symptoms: moderate fat depletion, moderate muscle depletion, severe fat depletion, severe muscle depletion    Interventions: Ensure Enlive (each supplement provides 350kcal and 20 grams of protein), Snacks  Estimated body mass index is 15.78 kg/m as calculated from the following:   Height as of this encounter: 5\' 4"  (1.626 m).   Weight as of this encounter: 41.7 kg.  DVT prophylaxis: Lovenox Code Status: DO NOT RESUSCITATE Family Communication: None at bedside Disposition Plan:  Status is: Inpatient  Dispo: The patient is from: Home              Anticipated d/c is to: PT OT recommends SNF but family and patient cannot afford palliative discussion pending              Anticipated d/c date is: Unknown              Patient currently is not medically stable to d/c.   Consultants:   Orthopedics  Procedures: Right hip IM nailing 02/05/2020 Antimicrobials: None  Subjective:  She is resting in bed, chronically ill-appearing cachectic Joint deformities of the upper and lower extremities with contractures noted Appears tachypneic she denies shortness of  breath  Objective: Vitals:   02/11/20 0800 02/11/20 0839 02/11/20 0900 02/11/20 1000  BP: (!) 160/81     Pulse: 81  77 84  Resp: (!) 21  17 (!) 26  Temp: (!) 97 F (36.1 C)     TempSrc: Axillary     SpO2: 99% 98% 98% 100%  Weight:      Height:        Intake/Output Summary (Last 24 hours) at 02/11/2020 1041 Last data filed at 02/11/2020 0900 Gross per 24 hour  Intake 1123.68 ml  Output 2850 ml  Net -1726.32 ml   Filed Weights   02/04/20 1531 02/05/20 1151 02/06/20 0246  Weight: 47 kg 47 kg 41.7 kg    Examination:  General exam: Appears tachypneic Respiratory system: Clear to auscultation. Respiratory effort normal. Cardiovascular system: S1 & S2 heard, RRR. No JVD, murmurs, rubs, gallops or clicks. No pedal edema. Gastrointestinal system: Abdomen is nondistended, soft and nontender. No organomegaly or masses felt. Normal bowel sounds heard. Central nervous system: Alert and oriented. No focal neurological deficits. Extremities: Contracted upper extremities Skin: No rashes, lesions or ulcers Psychiatry: Judgement and insight appear normal. Mood & affect appropriate.     Data Reviewed: I have personally reviewed following labs and imaging studies  CBC: Recent Labs  Lab 02/07/20 0415 02/08/20 0650 02/09/20 0256 02/10/20 0244 02/11/20 0226  WBC 13.7* 11.2* 6.8 7.4 9.0  NEUTROABS 11.2* 8.6* 5.1 5.7 6.9  HGB 9.5* 9.2* 11.1* 9.6* 10.0*  HCT 32.7* 31.8* 37.6 33.7* 33.9*  MCV 94.0 94.9 93.8 97.4 94.2  PLT 361 324 258 366 424*   Basic Metabolic Panel: Recent Labs  Lab 02/07/20 0415 02/08/20 0650 02/09/20 0256 02/10/20 0244 02/11/20 0226  NA 133* 138 141 141 139  K 4.7 5.4* 4.8 4.8 3.6  CL 98 105 107 105 96*  CO2 23 26 25 29  33*  GLUCOSE 130* 111* 95 118* 122*  BUN 33* 33* 23* 18 19  CREATININE 1.15* 0.80 0.70 0.51 0.58  CALCIUM 7.4* 8.1* 8.2* 8.2* 8.2*   GFR: Estimated Creatinine Clearance: 49.2 mL/min (by C-G formula based on SCr of 0.58 mg/dL). Liver  Function Tests: Recent Labs  Lab 02/04/20 1601  AST 19  ALT 12  ALKPHOS 87  BILITOT 0.3  PROT 7.6  ALBUMIN 3.5   No results for input(s): LIPASE, AMYLASE in the last 168 hours. No results for input(s): AMMONIA in the last 168 hours. Coagulation Profile: No results for input(s): INR, PROTIME in the last 168 hours. Cardiac Enzymes: Recent Labs  Lab 02/04/20 1601  CKTOTAL 410*   BNP (last 3 results) No results for input(s): PROBNP in the last 8760 hours. HbA1C: No results for input(s): HGBA1C in the last 72 hours. CBG: No results for input(s): GLUCAP in the last 168 hours. Lipid Profile: No results for input(s): CHOL, HDL, LDLCALC, TRIG, CHOLHDL, LDLDIRECT in the last 72 hours. Thyroid Function Tests: No results for input(s): TSH, T4TOTAL, FREET4, T3FREE, THYROIDAB in the last 72 hours. Anemia Panel: No results for input(s): VITAMINB12, FOLATE, FERRITIN, TIBC, IRON, RETICCTPCT in the last 72 hours. Sepsis Labs: Recent Labs  Lab 02/09/20 0256  LATICACIDVEN 0.7    Recent Results (from the past 240 hour(s))  SARS Coronavirus 2 by RT PCR (hospital order, performed  in [facility] hospital lab) Nasopharyngeal Nasopharyngeal Swab     Status: None   Collection Time: 02/05/20 12:20 AM   Specimen: Nasopharyngeal Swab  Result Value Ref Range Status   SARS Coronavirus 2 NEGATIVE NEGATIVE Final    Comment: (NOTE) SARS-CoV-2 target nucleic acids are NOT DETECTED. The SARS-CoV-2 RNA is generally detectable in upper and lower respiratory specimens during the acute phase of infection. The lowest concentration of SARS-CoV-2 viral copies this assay can detect is 250 copies / mL. A negative result does not preclude SARS-CoV-2 infection and should not be used as the sole basis for treatment or other patient management decisions.  A negative result may occur with improper specimen collection / handling, submission of specimen other than nasopharyngeal swab, presence of viral mutation(s)  within the areas targeted by this assay, and inadequate number of viral copies (<250 copies / mL). A negative result must be combined with clinical observations, patient history, and epidemiological information. Fact Sheet for Patients:   BoilerBrush.com.cy Fact Sheet for Healthcare Providers: https://pope.com/ This test is not yet approved or cleared  by the [place] FDA and has been authorized for detection and/or diagnosis of SARS-CoV-2 by FDA under an Emergency Use Authorization (EUA).  This EUA will remain in effect (meaning this test can be used) for the duration of the COVID-19 declaration under Section 564(b)(1) of the Act, 21 U.S.C. section 360bbb-3(b)(1), unless the authorization is terminated or revoked sooner. Performed at [facility], [address] [place], [place] [address]   MRSA PCR Screening     Status: None   Collection Time: 02/06/20  2:12 AM   Specimen: Nasal Mucosa; Nasopharyngeal  Result Value Ref Range Status   MRSA by PCR NEGATIVE NEGATIVE Final    Comment:        The GeneXpert MRSA Assay (FDA approved for NASAL specimens only), is one component of a comprehensive MRSA colonization surveillance program. It is not intended to diagnose MRSA infection nor to guide or monitor treatment for MRSA infections. Performed at [facility], [address] [place], [place] [address]          Radiology Studies: [facility] Chest Port 1 View  Result Date: 02/10/2020 CLINICAL DATA:  59 year old female with hypoxia. Recent surgical repair of right femur fracture. Chronic scoliosis. EXAM: PORTABLE CHEST 1 VIEW COMPARISON:  02/07/2020 portable chest and earlier. FINDINGS: Portable AP semi upright view at 0822 hours. Severe dextroconvex thoracic scoliosis again noted. Chronic rib and proximal humerus fractures. Progressive increased right lung opacity, and increased bibasilar density.  Stable cardiac size and mediastinal contours. No pneumothorax. Increased pulmonary vascularity. Visualized tracheal air column is within normal limits. IMPRESSION: 1. Increasing bilateral lung base and additional indistinct right lung opacity. This probably in part reflects bilateral pleural effusions, and consider interstitial edema. Although the additional right lung opacity is also suspicious for pneumonia. 2. Severe scoliosis, and chronic skeletal trauma. Electronically Signed   By: Odessa Fleming M.D.   On: 02/10/2020 08:36        Scheduled Meds: . buPROPion  150 mg Oral Daily  . chlorhexidine  15 mL Mouth Rinse BID  . Chlorhexidine Gluconate Cloth  6 each Topical Daily  . docusate sodium  100 mg Oral BID  . enoxaparin (LOVENOX) injection  30 mg Subcutaneous Q24H  . escitalopram  20 mg Oral Daily  . feeding supplement (ENSURE ENLIVE)  237 mL Oral TID BM  . ferrous sulfate  325 mg Oral Q breakfast  .  ipratropium-albuterol  3 mL Nebulization BID  . lisinopril  20 mg Oral Daily  . mouth rinse  15 mL Mouth Rinse q12n4p  . predniSONE  5 mg Oral Daily  . senna  1 tablet Oral BID  . vitamin B-12  1,000 mcg Oral Daily   Continuous Infusions: . ceFEPime (MAXIPIME) IV Stopped (02/11/20 3967)  . metronidazole 500 mg (02/11/20 1011)     LOS: 6 days     Georgette Shell, MD  02/11/2020, 10:41 AM

## 2020-02-11 NOTE — Progress Notes (Signed)
Nutrition Follow-up  DOCUMENTATION CODES:   Severe malnutrition in context of chronic illness, Underweight  INTERVENTION:  - continue Ensure Enlive TID and night time snack.  - will continue to monitor plans concerning GOC.Marland Kitchen  NUTRITION DIAGNOSIS:   Severe Malnutrition related to chronic illness(RA and scoliosis) as evidenced by moderate fat depletion, moderate muscle depletion, severe fat depletion, severe muscle depletion. -ongoing  GOAL:   Patient will meet greater than or equal to 90% of their needs -unmet  MONITOR:   PO intake, Supplement acceptance, Labs, Weight trends  ASSESSMENT:   59 y.o. female with medical history of rheumatoid arthritis, extensive scoliosis, major depressive disorder, HTN, osteopenia, insomnia, and severe malnutrition. She presented to the ED after a fall at home; she was on the floor for >12 hours.  Diet changed from Regular to NPO on 5/30 at 1212 and then advanced to FLD on 5/31 at 0845. Per flow sheet documentation, she consumed 25% of breakfast on 5/31; 25% of breakfast, 100% of lunch, and 0% of dinner on 6/1. She has been accepting Ensure 50-75% of the time offered. Patient was last weighed on 5/28.  Able to talk with RN and Palliative Care MD about patient.    Labs reviewed; Cl: 96 mmol/l, Ca: 8.2 mg/dl. Medications reviewed; 100 mg colace BID, 325 mg ferrous sulfate/day, 40 mg IV lasix x1 dose 6/1, 5 mg deltasone/day, 1 tablet senokot BID, 1000 mcg oral cyanocobalamin/day.    Diet Order:   Diet Order            Diet full liquid Room service appropriate? Yes; Fluid consistency: Thin  Diet effective now              EDUCATION NEEDS:   No education needs have been identified at this time  Skin:  Skin Assessment: Skin Integrity Issues: Skin Integrity Issues:: Stage I, Incisions Stage I: sacrum Incisions: R hip (5/27)  Last BM:  5/31  Height:   Ht Readings from Last 1 Encounters:  02/06/20 5\' 4"  (1.626 m)    Weight:   Wt  Readings from Last 1 Encounters:  02/06/20 41.7 kg    Estimated Nutritional Needs:  Kcal:  1600-1800 kcal Protein:  75-85 grams Fluid:  >/= 1.8 L/day     02/08/20, MS, RD, LDN, CNSC Inpatient Clinical Dietitian RD pager # available in AMION  After hours/weekend pager # available in [facility]

## 2020-02-11 NOTE — TOC Progression Note (Signed)
Transition of Care [facility]) - Progression Note    Patient Details  Name: Tara Brown MRN: [id number] Date of Birth: [birth year]  Transition of Care [facility]) CM/SW Contact  Golda Acre, RN Phone Number: 02/11/2020, 9:15 AM  Clinical Narrative:    tct-Phil Hocutt per hois and the patient's request.  Has spoken to Dr. Neale Burly and is expecting a call from him this am.  Will decide what next steps need to be taken.  Explained my role and will keep in touch with him and patient.   Expected Discharge Plan: Skilled Nursing Facility Barriers to Discharge: Continued Medical Work up  Expected Discharge Plan and Services Expected Discharge Plan: Skilled Nursing Facility In-house Referral: Clinical Social Work   Post Acute Care Choice: Skilled Nursing Facility Living arrangements for the past 2 months: Apartment                                       Social Determinants of Health (SDOH) Interventions    Readmission Risk Interventions No flowsheet data found.

## 2020-02-11 NOTE — Progress Notes (Signed)
Subjective: 6 Days Post-Op s/p Procedure(s): RIGHT HIP, INTRAMEDULLARY NAIL FIXATION   Patient is somnolent on exam. States pain is moderate, worse in left hip than in right. Denies chest pain but states she is short of breath. Denies abdominal pain or nausea.    Objective:  PE: VITALS:   Vitals:   02/11/20 0300 02/11/20 0400 02/11/20 0800 02/11/20 0839  BP:  (!) 144/124 (!) 160/81   Pulse: 92 92 81   Resp: (!) 27 (!) 32 (!) 21   Temp:  (!) 97.4 F (36.3 C)    TempSrc:  Axillary    SpO2: 100% 99% 99% 98%  Weight:      Height:        General: somnolent on exam, in no acute distress, on nasal cannula.  MSK: RLE dressings in place with scant drainage. Dorsiflexion and plantarflexion intact bilaterally. Intact DP pulses bilaterally. Distal sensation intact bilaterally.   LABS  Results for orders placed or performed during the hospital encounter of 02/04/20 (from the past 24 hour(s))  BLOOD TRANSFUSION REPORT - SCANNED     Status: None   Collection Time: 02/10/20 11:42 AM   Narrative   Ordered by an unspecified provider.  Basic metabolic panel     Status: Abnormal   Collection Time: 02/11/20  2:26 AM  Result Value Ref Range   Sodium 139 135 - 145 mmol/L   Potassium 3.6 3.5 - 5.1 mmol/L   Chloride 96 (L) 98 - 111 mmol/L   CO2 33 (H) 22 - 32 mmol/L   Glucose, Bld 122 (H) 70 - 99 mg/dL   BUN 19 6 - 20 mg/dL   Creatinine, Ser 6.75 0.44 - 1.00 mg/dL   Calcium 8.2 (L) 8.9 - 10.3 mg/dL   GFR calc non Af Amer >60 >60 mL/min   GFR calc Af Amer >60 >60 mL/min   Anion gap 10 5 - 15  CBC with Differential/Platelet     Status: Abnormal   Collection Time: 02/11/20  2:26 AM  Result Value Ref Range   WBC 9.0 4.0 - 10.5 K/uL   RBC 3.60 (L) 3.87 - 5.11 MIL/uL   Hemoglobin 10.0 (L) 12.0 - 15.0 g/dL   HCT 44.9 (L) 20.1 - 00.7 %   MCV 94.2 80.0 - 100.0 fL   MCH 27.8 26.0 - 34.0 pg   MCHC 29.5 (L) 30.0 - 36.0 g/dL   RDW 12.1 (H) 97.5 - 88.3 %   Platelets 424 (H) 150 - 400 K/uL    nRBC 0.0 0.0 - 0.2 %   Neutrophils Relative % 77 %   Neutro Abs 6.9 1.7 - 7.7 K/uL   Lymphocytes Relative 13 %   Lymphs Abs 1.2 0.7 - 4.0 K/uL   Monocytes Relative 9 %   Monocytes Absolute 0.8 0.1 - 1.0 K/uL   Eosinophils Relative 1 %   Eosinophils Absolute 0.1 0.0 - 0.5 K/uL   Basophils Relative 0 %   Basophils Absolute 0.0 0.0 - 0.1 K/uL   Immature Granulocytes 0 %   Abs Immature Granulocytes 0.03 0.00 - 0.07 K/uL    DG Chest Port 1 View  Result Date: 02/10/2020 CLINICAL DATA:  59 year old female with hypoxia. Recent surgical repair of right femur fracture. Chronic scoliosis. EXAM: PORTABLE CHEST 1 VIEW COMPARISON:  02/07/2020 portable chest and earlier. FINDINGS: Portable AP semi upright view at 0822 hours. Severe dextroconvex thoracic scoliosis again noted. Chronic rib and proximal humerus fractures. Progressive increased right lung opacity, and increased  bibasilar density. Stable cardiac size and mediastinal contours. No pneumothorax. Increased pulmonary vascularity. Visualized tracheal air column is within normal limits. IMPRESSION: 1. Increasing bilateral lung base and additional indistinct right lung opacity. This probably in part reflects bilateral pleural effusions, and consider interstitial edema. Although the additional right lung opacity is also suspicious for pneumonia. 2. Severe scoliosis, and chronic skeletal trauma. Electronically Signed   By: Genevie Ann M.D.   On: 02/10/2020 08:36    Assessment/Plan: Active Problems:   Hip fx (HCC)   Closed right hip fracture (HCC)   Pressure injury of skin   Protein-calorie malnutrition, severe  Right subtrochanteric femur fracture, left periprosthetic acetabular fracture End stage rheumatoid arthritis  - 6 days post-op right hip IM nail - spoke with arthroplasty revision specialist and ortho trauma specialist and both recommended non-operative management of acetabular fracture Weightbearing:Pivot transfer onlyRLE,  NWBLLE Insicional and dressing care:PRN dressing changes VTE prophylaxis:lovenox Pain control:continue current regimen Follow - up plan:Follow-up with Dr. Mardelle Matte in 2 weeks Dispo: Cannot afford SNF. Palliative care consulted with plan for family meeting today.  Contact information:   Weekdays 8-5 [address], [place] [phone] A fter hours and holidays please check [email or web address] for group call information for [facility]  Ventura Bruns 02/11/2020, 9:07 AM

## 2020-02-12 MED ORDER — POTASSIUM CHLORIDE CRYS ER 10 MEQ PO TBCR
10.0000 meq | EXTENDED_RELEASE_TABLET | Freq: Every day | ORAL | Status: DC
  Administered 2020-02-12 – 2020-02-18 (×7): 10 meq via ORAL
  Filled 2020-02-12 (×7): qty 1

## 2020-02-12 MED ORDER — FUROSEMIDE 10 MG/ML IJ SOLN
20.0000 mg | Freq: Every day | INTRAMUSCULAR | Status: DC
  Administered 2020-02-12: 20 mg via INTRAVENOUS
  Filled 2020-02-12: qty 2

## 2020-02-12 MED ORDER — FUROSEMIDE 10 MG/ML IJ SOLN
20.0000 mg | Freq: Every day | INTRAMUSCULAR | Status: AC
  Administered 2020-02-13 – 2020-02-15 (×2): 20 mg via INTRAVENOUS
  Filled 2020-02-12 (×3): qty 2

## 2020-02-12 NOTE — Progress Notes (Signed)
Pt seen and given her scheduled nebulized treatment which she tolerated well.  No increased wob/respiratory distress noted or voiced by patient.  Bipap remains at bedside but not indicated at this time.

## 2020-02-12 NOTE — Progress Notes (Signed)
PROGRESS NOTE    Tara Brown  ZOX:[id number] DOB: [birth year] DOA: 02/04/2020 PCP: [name], [name]    Brief Narrative:HPI from Dr Silas Flood Hocottis a 59 y.o.femalewith medical history significant for rheumatoid arthritis with extensive scoliosis, major depressive disorder, hypertension, osteopenia, insomnia severe malnutrition presents to the hospitals/p fall, found down at home by health aide was on floor for over12hours. Patient states she had a mechanical fall. She notes no loc or head trauma , but s/p fall unable to get up and note b/l hip pain and back pain. In the ED, VSS, labs stable from prior. Xray showed right subtrochanteric femur fracture, left periprosthetic acetabular fracture. Orthopedics consulted. Patient admitted for further management.   Assessment & Plan:   Active Problems:   Hip fx (HCC)   Closed right hip fracture (HCC)   Pressure injury of skin   Protein-calorie malnutrition, severe   Fall   #1 right subtrochanteric femur fracture and left periprosthetic acetabular fracture status post right hip IM nail 02/05/2020 by Dr. Dion Saucier.  Ortho following.  #2 acute urinary retention improving after IV fluids and difficult Foley placement by urology  #3 rheumatoid arthritis/extensive scoliosis/osteopenia multiple falls with fracture on prednisone and weekly Fosamax.  Methotrexate on hold.  #4 acute hypoxic hypercapnic respiratory failure-chest x-ray shows findings consistent with possible pulmonary edema.  On 3 L saturation above 88%.  Will give Lasix 20 mg daily for 3 days and monitor.  #5 Abla/B12/iron deficiency Baseline hemoglobin approximately around 8.  She received 2 units of blood transfusion on 02/05/2020 after surgery.  Status post Feraheme 1 dose 02/05/2020.  Continue oral iron and B12 supplementation.  Hemoglobin today is 10.  #6 essential hypertension continue lisinopril.  Blood pressure 145/82.    #7 depression on Lexapro and  Wellbutrin  #8 severe malnutrition follow dietary recommendations  #9 goals of care appreciate palliative input.  Palliative care to discuss with patient's family and patient today.  #10 right and left sacrum stage I pressure injury present on admission continue local wound care.   Pressure Injury 02/06/20 Sacrum Left;Right Stage 1 -  Intact skin with non-blanchable redness of a localized area usually over a bony prominence. (Active)  02/06/20 0235  Location: Sacrum  Location Orientation: Left;Right  Staging: Stage 1 -  Intact skin with non-blanchable redness of a localized area usually over a bony prominence.  Wound Description (Comments):   Present on Admission: Yes      Nutrition Problem: Severe Malnutrition Etiology: chronic illness(RA and scoliosis)     Signs/Symptoms: moderate fat depletion, moderate muscle depletion, severe fat depletion, severe muscle depletion    Interventions: Ensure Enlive (each supplement provides 350kcal and 20 grams of protein), Snacks  Estimated body mass index is 15.78 kg/m as calculated from the following:   Height as of this encounter: 5\' 4"  (1.626 m).   Weight as of this encounter: 41.7 kg.  DVT prophylaxis: Lovenox Code Status: DO NOT RESUSCITATE Family Communication: None at bedside Disposition Plan:  Status is: Inpatient  Dispo: The patient is from: Home  Anticipated d/c is to: PT OT recommends SNF but family and patient cannot afford palliative discussion pending  Anticipated d/c date is: Unknown  Patient currently is not medically stable to d/c.   Consultants:   Orthopedics  Procedures: Right hip IM nailing 02/05/2020 Antimicrobials: None   Subjective: Patient requesting IV Dilaudid she does not appear to be in any acute pain or distress.  She states she has pain all over.  Pain medication  have already been increased by Ortho and palliative care.  Objective: Vitals:    02/12/20 0720 02/12/20 0800 02/12/20 0857 02/12/20 1151  BP: (!) 171/98 (!) 145/82    Pulse:  73    Resp:  (!) 24    Temp:    (!) 97.4 F (36.3 C)  TempSrc:    Axillary  SpO2:  (!) 88% 95%   Weight:      Height:        Intake/Output Summary (Last 24 hours) at 02/12/2020 1340 Last data filed at 02/12/2020 1000 Gross per 24 hour  Intake 220.21 ml  Output 1300 ml  Net -1079.79 ml   Filed Weights   02/04/20 1531 02/05/20 1151 02/06/20 0246  Weight: 47 kg 47 kg 41.7 kg    Examination:  General exam: Appears calm and comfortable  Respiratory system coarse to auscultation. Respiratory effort normal. Cardiovascular system: S1 & S2 heard, RRR. No JVD, murmurs, rubs, gallops or clicks. No pedal edema. Gastrointestinal system: Abdomen is nondistended, soft and nontender. No organomegaly or masses felt. Normal bowel sounds heard. Central nervous system: Alert and oriented. No focal neurological deficits. Extremities: Contracted the right hip incision clean dry intact  skin: See pressure ulcer per below Psychiatry: Judgement and insight appear normal. Mood & affect appropriate.     Data Reviewed: I have personally reviewed following labs and imaging studies  CBC: Recent Labs  Lab 02/07/20 0415 02/08/20 0650 02/09/20 0256 02/10/20 0244 02/11/20 0226  WBC 13.7* 11.2* 6.8 7.4 9.0  NEUTROABS 11.2* 8.6* 5.1 5.7 6.9  HGB 9.5* 9.2* 11.1* 9.6* 10.0*  HCT 32.7* 31.8* 37.6 33.7* 33.9*  MCV 94.0 94.9 93.8 97.4 94.2  PLT 361 324 258 366 424*   Basic Metabolic Panel: Recent Labs  Lab 02/07/20 0415 02/08/20 0650 02/09/20 0256 02/10/20 0244 02/11/20 0226  NA 133* 138 141 141 139  K 4.7 5.4* 4.8 4.8 3.6  CL 98 105 107 105 96*  CO2 23 26 25 29  33*  GLUCOSE 130* 111* 95 118* 122*  BUN 33* 33* 23* 18 19  CREATININE 1.15* 0.80 0.70 0.51 0.58  CALCIUM 7.4* 8.1* 8.2* 8.2* 8.2*   GFR: Estimated Creatinine Clearance: 49.2 mL/min (by C-G formula based on SCr of 0.58 mg/dL). Liver  Function Tests: No results for input(s): AST, ALT, ALKPHOS, BILITOT, PROT, ALBUMIN in the last 168 hours. No results for input(s): LIPASE, AMYLASE in the last 168 hours. No results for input(s): AMMONIA in the last 168 hours. Coagulation Profile: No results for input(s): INR, PROTIME in the last 168 hours. Cardiac Enzymes: No results for input(s): CKTOTAL, CKMB, CKMBINDEX, TROPONINI in the last 168 hours. BNP (last 3 results) No results for input(s): PROBNP in the last 8760 hours. HbA1C: No results for input(s): HGBA1C in the last 72 hours. CBG: No results for input(s): GLUCAP in the last 168 hours. Lipid Profile: No results for input(s): CHOL, HDL, LDLCALC, TRIG, CHOLHDL, LDLDIRECT in the last 72 hours. Thyroid Function Tests: No results for input(s): TSH, T4TOTAL, FREET4, T3FREE, THYROIDAB in the last 72 hours. Anemia Panel: No results for input(s): VITAMINB12, FOLATE, FERRITIN, TIBC, IRON, RETICCTPCT in the last 72 hours. Sepsis Labs: Recent Labs  Lab 02/09/20 0256  LATICACIDVEN 0.7    Recent Results (from the past 240 hour(s))  SARS Coronavirus 2 by RT PCR (hospital order, performed in [facility] hospital lab) Nasopharyngeal Nasopharyngeal Swab     Status: None   Collection Time: 02/05/20 12:20 AM   Specimen: Nasopharyngeal Swab  Result Value Ref Range Status   SARS Coronavirus 2 NEGATIVE NEGATIVE Final    Comment: (NOTE) SARS-CoV-2 target nucleic acids are NOT DETECTED. The SARS-CoV-2 RNA is generally detectable in upper and lower respiratory specimens during the acute phase of infection. The lowest concentration of SARS-CoV-2 viral copies this assay can detect is 250 copies / mL. A negative result does not preclude SARS-CoV-2 infection and should not be used as the sole basis for treatment or other patient management decisions.  A negative result may occur with improper specimen collection / handling, submission of specimen other than nasopharyngeal swab, presence of  viral mutation(s) within the areas targeted by this assay, and inadequate number of viral copies (<250 copies / mL). A negative result must be combined with clinical observations, patient history, and epidemiological information. Fact Sheet for Patients:   StrictlyIdeas.no Fact Sheet for Healthcare Providers: BankingDealers.co.za This test is not yet approved or cleared  by the [place] FDA and has been authorized for detection and/or diagnosis of SARS-CoV-2 by FDA under an Emergency Use Authorization (EUA).  This EUA will remain in effect (meaning this test can be used) for the duration of the COVID-19 declaration under Section 564(b)(1) of the Act, 21 U.S.C. section 360bbb-3(b)(1), unless the authorization is terminated or revoked sooner. Performed at [facility], [place] [address], [place], [place] [address]   MRSA PCR Screening     Status: None   Collection Time: 02/06/20  2:12 AM   Specimen: Nasal Mucosa; Nasopharyngeal  Result Value Ref Range Status   MRSA by PCR NEGATIVE NEGATIVE Final    Comment:        The GeneXpert MRSA Assay (FDA approved for NASAL specimens only), is one component of a comprehensive MRSA colonization surveillance program. It is not intended to diagnose MRSA infection nor to guide or monitor treatment for MRSA infections. Performed at [facility], [place] [address], [place], [place] [address]          Radiology Studies: No results found.      Scheduled Meds: . buPROPion  150 mg Oral Daily  . chlorhexidine  15 mL Mouth Rinse BID  . Chlorhexidine Gluconate Cloth  6 each Topical Daily  . docusate sodium  100 mg Oral BID  . enoxaparin (LOVENOX) injection  30 mg Subcutaneous Q24H  . escitalopram  20 mg Oral Daily  . feeding supplement (ENSURE ENLIVE)  237 mL Oral TID BM  . ferrous sulfate  325 mg Oral Q breakfast  . ipratropium-albuterol  3 mL  Nebulization BID  . lisinopril  20 mg Oral Daily  . mouth rinse  15 mL Mouth Rinse q12n4p  . predniSONE  5 mg Oral Daily  . senna  1 tablet Oral BID  . vitamin B-12  1,000 mcg Oral Daily   Continuous Infusions: . ceFEPime (MAXIPIME) IV 10 mL/hr at 02/12/20 1000  . metronidazole 500 mg (02/12/20 1114)     LOS: 7 days    Georgette Shell, MD 02/12/2020, 1:40 PM

## 2020-02-12 NOTE — Progress Notes (Signed)
     Subjective: 7 Days Post-Op s/p Procedure(s): RIGHT HIP, INTRAMEDULLARY NAIL FIXATION   Patient is alert, laying in bed. Does not appear as somnolent on exam this morning. Patient in severe pain bilaterally, patient requesting an injection of pain medicine this morning.    Objective:  PE: VITALS:   Vitals:   02/12/20 0400 02/12/20 0720 02/12/20 0800 02/12/20 0857  BP: (!) 170/100 (!) 171/98 (!) 145/82   Pulse: 76  73   Resp: (!) 28  (!) 24   Temp:      TempSrc:      SpO2: 92%  (!) 88% 95%  Weight:      Height:       General: laying in bed, appears more awake and able to communicate better this morning MSK: Operative dressings in place, with scant drainage. Dorsiflexion and plantar flexion intact bilaterally, bilateral feet warm and well perfused. Distal sensation intact. Able to perform AROM at bilateral knees without pain. TTP to left and right groin, left worse than right.   Assessment/Plan: Active Problems:   Hip fx (HCC)   Closed right hip fracture (HCC)   Pressure injury of skin   Protein-calorie malnutrition, severe   Fall  Right subtrochanteric femur fracture, left periprosthetic acetabular fracture End stage rheumatoid arthritis - 7 days post-op right hip IM nail - spoke with arthroplasty revision specialist and ortho trauma specialist and both recommended non-operative management of acetabular fracture Weightbearing:Pivot transfer onlyRLE, NWBLLE Insicional and dressing care:PRN dressing changes, will plan to change mepilexes tomorrow VTE prophylaxis:lovenox Pain control: increased to hydrocodone 10/325 q 4 hours. Will defer to medicine/palliative care for increases in opioid pain medication given her respiratory status.  Follow - up plan:Follow-up with Dr. Dion Saucier 2 weeks after discharge Dispo: Palliative care consults. Cannot afford SNF, hopes to return home with help of sisters and aides that come a few hours a day   Armida Sans 02/12/2020, 9:02  AM

## 2020-02-12 NOTE — Progress Notes (Signed)
Chaplain rounding on pt due to RN referral.   Introduced spiritual care as resource.  Pt was resting on chaplain arrival.  She did not engage, but thanked chaplain for coming.  We will continue to follow for support    Please page [phone] if needs arise.

## 2020-02-12 NOTE — Progress Notes (Signed)
Daily Progress Note   Patient Name: Tara Brown       Date: 02/12/2020 DOB: 1960/09/20  Age: 59 y.o. MRN#: [id number] Attending Physician: Georgette Shell, MD Primary Care Physician: Iva Lento, [name] Admit Date: 02/04/2020  Reason for Consultation/Follow-up: Establishing goals of care  Subjective: I met today with Tara Brown.    She was finishing a breathing treatment at time of my encounter.  She states she is doing okay, but she wishes she could have more IV pain medication.  Discussed plan to continue with judicious use of opioid medications.  She is more frustrated today and has been saying that she is ready to die.  I called and spoke with her father.  See below.  Length of Stay: 7  Current Medications: Scheduled Meds:  . buPROPion  150 mg Oral Daily  . chlorhexidine  15 mL Mouth Rinse BID  . Chlorhexidine Gluconate Cloth  6 each Topical Daily  . docusate sodium  100 mg Oral BID  . enoxaparin (LOVENOX) injection  30 mg Subcutaneous Q24H  . escitalopram  20 mg Oral Daily  . feeding supplement (ENSURE ENLIVE)  237 mL Oral TID BM  . ferrous sulfate  325 mg Oral Q breakfast  . [START ON 02/13/2020] furosemide  20 mg Intravenous Daily  . ipratropium-albuterol  3 mL Nebulization BID  . lisinopril  20 mg Oral Daily  . mouth rinse  15 mL Mouth Rinse q12n4p  . potassium chloride  10 mEq Oral Daily  . predniSONE  5 mg Oral Daily  . senna  1 tablet Oral BID  . vitamin B-12  1,000 mcg Oral Daily    Continuous Infusions: . ceFEPime (MAXIPIME) IV Stopped (02/12/20 1113)  . metronidazole Stopped (02/12/20 1826)    PRN Meds: acetaminophen, albuterol, alum & mag hydroxide-simeth, bisacodyl, hydrALAZINE, HYDROcodone-acetaminophen, magnesium citrate,  menthol-cetylpyridinium **OR** phenol, ondansetron **OR** ondansetron (ZOFRAN) IV, polyethylene glycol, zolpidem  Physical Exam        General: Frail and chronically ill-appearing, cachectic with severe scoliosis noted Heart: Regular rate and rhythm. No murmur appreciated. Lungs: Tachypnea, decreased air movement Abdomen: Soft, nontender, nondistended, positive bowel sounds.  Ext: No significant edema, joint deformity in upper and lower extremities with noted contracture and RA nodules Skin: Warm and dry Neuro: Grossly intact, nonfocal.   Vital Signs: BP  96/66   Pulse 69   Temp 97.7 F (36.5 C) (Oral)   Resp 20   Ht _0  (1.626 m)   Wt 41.7 kg   SpO2 97%   BMI 15.78 kg/m  SpO2: SpO2: 97 % O2 Device: O2 Device: High Flow Nasal Cannula(salter, non-heated) O2 Flow Rate: O2 Flow Rate (L/min): 5 L/min  Intake/output summary:   Intake/Output Summary (Last 24 hours) at 02/12/2020 1949 Last data filed at 02/12/2020 1831 Gross per 24 hour  Intake 1512.47 ml  Output 2150 ml  Net -637.53 ml   LBM: Last BM Date: 02/09/20 Baseline Weight: Weight: 47 kg Most recent weight: Weight: 41.7 kg   Flowsheet Rows     Most Recent Value  Intake Tab  Referral Department  Hospitalist  Unit at Time of Referral  Intermediate Care Unit  Palliative Care Primary Diagnosis  Trauma  Date Notified  02/10/20  Palliative Care Type  New Palliative care  Reason for referral  Clarify Goals of Care  Date of Admission  02/04/20  Date first seen by Palliative Care  02/10/20  # of days Palliative referral response time  0 Day(s)  # of days IP prior to Palliative referral  6  Clinical Assessment  Palliative Performance Scale Score  40%  Psychosocial & Spiritual Assessment  Palliative Care Outcomes  Patient/Family meeting held?  Yes  Who was at the meeting?  Patient, Father via phone      Patient Active Problem List   Diagnosis Date Noted  . Fall   . Pressure injury of skin 02/06/2020  .  Protein-calorie malnutrition, severe 02/06/2020  . Hip fx ([place]) 02/05/2020  . Closed right hip fracture ([place]) 02/05/2020  . MDD (major depressive disorder) 02/17/2017  . Osteoporosis 05/29/2016  . HTN (hypertension) 05/29/2016  . MDD (major depressive disorder), recurrent severe, without psychosis ([place]) 05/28/2016  . Rheumatoid arthritis ([place]) 12/06/2015  . Anemia of chronic disease 03/08/2015    Palliative Care Assessment & Plan   Patient Profile:  Recommendations/Plan: Pain: Currently ordered Norco 10/325 every 4 hours as needed.  She has been using this regularly and seems to be more awake, alert, and appropriate doing my encounter with her today.  She is not in acute distress during my encounter..  She has also been regularly requesting IV medications.  I am concerned about continuing to increase opioids as she did become somnolent and required BiPAP and concerned there was a component of opioid effects related to this.  I certainly want to make sure that we are doing excluded job possible controlling her pain, however, I also want to be judicious as she does have anxiety and I do have some concern for chemical coping. Goals of care: Discussed at length again with her father today as Tara Brown reports today that she just wants to die.  Her father is concerned because he does not feel she is safe to live on her own as this is what occurred prior to this fall which resulted in fracture.  He reports that Areyana will often tell people what it is that she thinks they want to hear in order to be able to do what she wants to do.  I certainly agree with his concern about her being alone.  She did tell me that she would like to work with hospice if she discharges home to control her pain and help support her as she does not want to return to the hospital anymore.  I called and discussed with  [facility] [place] who reports that they would be able to take her on service for home hospice, however,  hospice will only be able to accept her if she has somebody who can stay with her in the home as they do not provide 24-hour in-home care.  At this point, I do not feel that she is appropriate for residential hospice placement as I believe her prognosis is longer than generally accepted residential hospice timeline of a few weeks. She is certainly still at risk for further decompensation and/or death this hospitalization, but she appears to be a little improved from my encounter with her yesterday.  I discussed with her and her father regarding plan for continuation of current care and following her clinical course.  If she worsens, plan will likely be for transitioning to comfort care.  If she improves, we will need to figure out best disposition option, likely with the inclusion of hospice services if she is not going to be able to go for rehab. Her overall goal is to focus on being comfortable and having good quality of life.  She reports that she is ready to die, however, she does appear to be improving.  She states that she wants to go home with hospice services.  I spoke with [facility] [place] today who can take her onto service, but only if she has somebody staying with her in the home.  Code Status:    Code Status Orders  (From admission, onward)         Start     Ordered   02/10/20 0816  Do not attempt resuscitation (DNR)  Continuous    Question Answer Comment  In the event of cardiac or respiratory ARREST Do not call a "code blue"   In the event of cardiac or respiratory ARREST Do not perform Intubation, CPR, defibrillation or ACLS   In the event of cardiac or respiratory ARREST Use medication by any route, position, wound care, and other measures to relive pain and suffering. May use oxygen, suction and manual treatment of airway obstruction as needed for comfort.      02/10/20 0815            Prognosis:  Guarded  Discharge Planning: To Be Determined  Care plan was  discussed with patient, father  Thank you for allowing the Palliative Medicine Team to assist in the care of this patient.   Total Time 30 Prolonged Time Billed No      Greater than 50%  of this time was spent counseling and coordinating care related to the above assessment and plan.  Micheline Rough, MD  Please contact Palliative Medicine Team phone at [phone] for questions and concerns.

## 2020-02-13 DIAGNOSIS — S32402A Unspecified fracture of left acetabulum, initial encounter for closed fracture: Secondary | ICD-10-CM

## 2020-02-13 MED ORDER — HYDROCODONE-ACETAMINOPHEN 10-325 MG PO TABS: 1.0000 | ORAL_TABLET | Freq: Four times a day (QID) | ORAL | 0 refills | Status: AC | PRN

## 2020-02-13 MED ORDER — ENOXAPARIN SODIUM 30 MG/0.3ML ~~LOC~~ SOLN: 30.0000 mg | SUBCUTANEOUS | 0 refills | Status: AC

## 2020-02-13 NOTE — TOC Progression Note (Signed)
Transition of Care [facility]) - Progression Note    Patient Details  Name: Tara Brown MRN: [id number] Date of Birth: 11-23-1960  [facility] CM/SW Contact  Golda Acre, RN Phone Number: 02/13/2020, 9:20 AM  Clinical Narrative:    Patient wanting to die.  [facility] [place] will take if someone is there in the home with her. Father is the next of kin and is aware.  Pt is on 1l/[place] for o2, iv abx and nebs. Will follow for dc needs.  Pallaitive care folloiwng.   Expected Discharge Plan: Skilled Nursing Facility Barriers to Discharge: Continued Medical Work up  Expected Discharge Plan and Services Expected Discharge Plan: Skilled Nursing Facility In-house Referral: Clinical Social Work   Post Acute Care Choice: Skilled Nursing Facility Living arrangements for the past 2 months: Apartment                                       Social Determinants of Health (SDOH) Interventions    Readmission Risk Interventions No flowsheet data found.

## 2020-02-13 NOTE — Progress Notes (Signed)
PROGRESS NOTE    Tara Brown  ZCH:[id number] DOB: May 18, 1961 DOA: 02/04/2020 PCP: [name], [name]   Brief Narrative:HPI from Dr Silas Flood Hocottis a 59 y.o.femalewith medical history significant for rheumatoid arthritis with extensive scoliosis, major depressive disorder, hypertension, osteopenia, insomnia severe malnutrition presents to the hospitals/p fall, found down at home by health aide was on floor for over12hours. Patient states she had a mechanical fall. She notes no loc or head trauma , but s/p fall unable to get up and note b/l hip pain and back pain. In the ED, VSS, labs stable from prior. Xray showed right subtrochanteric femur fracture, left periprosthetic acetabular fracture. Orthopedics consulted. Patient admitted for further management.   Assessment & Plan:   Active Problems:   Hip fx (HCC)   Closed right hip fracture (HCC)   Pressure injury of skin   Protein-calorie malnutrition, severe   Fall #1 right subtrochanteric femur fracture and left periprosthetic acetabular fracture status post right hip IM nail 02/05/2020 by Dr. Dion Saucier. Ortho following.  #2 acute urinary retention improving after IV fluids and difficult Foley placement by urology.  Patient will have to be discharged with the Foley in place and follow-up with urology as an outpatient.  #3 rheumatoid arthritis/extensive scoliosis/osteopenia multiple falls with fracture on prednisone and weekly Fosamax. Methotrexate on hold.  #4 acute hypoxic hypercapnic respiratory failure-chest x-ray shows findings consistent with possible pulmonary edema.  Currently on 1 L of oxygen saturation above 88%.  On Lasix 20 mg daily for 3 days today will be the second dose.  DC IV antibiotics.   #5 Abla/B12/iron deficiency Baseline hemoglobin approximately around 8. She received 2 units of blood transfusion on 02/05/2020 after surgery. Status post Feraheme 1 dose 02/05/2020. Continue oral iron and  B12 supplementation. Hemoglobin today is 10.  #6 essential hypertension-blood pressure 118/79.  Will monitor closely.    #7 depression on Lexapro and Wellbutrin  #8 severe malnutrition follow dietary recommendations  #9 goals of care appreciate palliative input.  Patient now agrees to go to rehab then to go home.  #10 right and left sacrum stage I pressure injury present on admission continue local wound care.   Pressure Injury 02/06/20 Sacrum Left;Right Stage 1 -  Intact skin with non-blanchable redness of a localized area usually over a bony prominence. (Active)  02/06/20 0235  Location: Sacrum  Location Orientation: Left;Right  Staging: Stage 1 -  Intact skin with non-blanchable redness of a localized area usually over a bony prominence.  Wound Description (Comments):   Present on Admission: Yes      Nutrition Problem: Severe Malnutrition Etiology: chronic illness(RA and scoliosis)     Signs/Symptoms: moderate fat depletion, moderate muscle depletion, severe fat depletion, severe muscle depletion    Interventions: Ensure Enlive (each supplement provides 350kcal and 20 grams of protein), Snacks  Estimated body mass index is 15.78 kg/m as calculated from the following:   Height as of this encounter: 5\' 4"  (1.626 m).   Weight as of this encounter: 41.7 kg. DVT prophylaxis:Lovenox Code Status:DO NOT RESUSCITATE Family Communication:None at bedside Disposition Plan:Status is: Inpatient  Dispo: The patient is from:Home Anticipated d/c is to:PT OT recommends SNF insurance authorization is pending. Anticipated d/c date Patient currentlyis  medically stable to d/c.  Consultants:  Orthopedics  Procedures:Right hip IM nailing 02/05/2020 Antimicrobials:None    Subjective:  Objective  she is resting in bed in no acute distress on 1 L of oxygen no new complaints.  E: Vitals:   02/13/20  0800  02/13/20 0833 02/13/20 1100 02/13/20 1200  BP: 118/79     Pulse: 63 69    Resp: 16 (!) 22    Temp: 98.1 F (36.7 C)  98 F (36.7 C) 98.1 F (36.7 C)  TempSrc: Oral  Oral Oral  SpO2: 93% 92%    Weight:      Height:        Intake/Output Summary (Last 24 hours) at 02/13/2020 1431 Last data filed at 02/13/2020 1106 Gross per 24 hour  Intake 1792.26 ml  Output 2100 ml  Net -307.74 ml   Filed Weights   02/04/20 1531 02/05/20 1151 02/06/20 0246  Weight: 47 kg 47 kg 41.7 kg    Examination:  General exam: Appears calm and comfortable  Respiratory system: Diminished breath sounds at the bases to auscultation. Respiratory effort normal. Cardiovascular system: S1 & S2 heard, RRR. No JVD, murmurs, rubs, gallops or clicks. No pedal edema. Gastrointestinal system: Abdomen is nondistended, soft and nontender. No organomegaly or masses felt. Normal bowel sounds heard. Central nervous system: Alert and oriented. No focal neurological deficits Extremities: . Contracted extremities Skin: No rashes, lesions or ulcers Psychiatry: Judgement and insight appear normal. Mood & affect appropriate.     Data Reviewed: I have personally reviewed following labs and imaging studies  CBC: Recent Labs  Lab 02/07/20 0415 02/08/20 0650 02/09/20 0256 02/10/20 0244 02/11/20 0226  WBC 13.7* 11.2* 6.8 7.4 9.0  NEUTROABS 11.2* 8.6* 5.1 5.7 6.9  HGB 9.5* 9.2* 11.1* 9.6* 10.0*  HCT 32.7* 31.8* 37.6 33.7* 33.9*  MCV 94.0 94.9 93.8 97.4 94.2  PLT 361 324 258 366 424*   Basic Metabolic Panel: Recent Labs  Lab 02/07/20 0415 02/08/20 0650 02/09/20 0256 02/10/20 0244 02/11/20 0226  NA 133* 138 141 141 139  K 4.7 5.4* 4.8 4.8 3.6  CL 98 105 107 105 96*  CO2 23 26 25 29  33*  GLUCOSE 130* 111* 95 118* 122*  BUN 33* 33* 23* 18 19  CREATININE 1.15* 0.80 0.70 0.51 0.58  CALCIUM 7.4* 8.1* 8.2* 8.2* 8.2*   GFR: Estimated Creatinine Clearance: 49.2 mL/min (by C-G formula based on SCr of 0.58  mg/dL). Liver Function Tests: No results for input(s): AST, ALT, ALKPHOS, BILITOT, PROT, ALBUMIN in the last 168 hours. No results for input(s): LIPASE, AMYLASE in the last 168 hours. No results for input(s): AMMONIA in the last 168 hours. Coagulation Profile: No results for input(s): INR, PROTIME in the last 168 hours. Cardiac Enzymes: No results for input(s): CKTOTAL, CKMB, CKMBINDEX, TROPONINI in the last 168 hours. BNP (last 3 results) No results for input(s): PROBNP in the last 8760 hours. HbA1C: No results for input(s): HGBA1C in the last 72 hours. CBG: No results for input(s): GLUCAP in the last 168 hours. Lipid Profile: No results for input(s): CHOL, HDL, LDLCALC, TRIG, CHOLHDL, LDLDIRECT in the last 72 hours. Thyroid Function Tests: No results for input(s): TSH, T4TOTAL, FREET4, T3FREE, THYROIDAB in the last 72 hours. Anemia Panel: No results for input(s): VITAMINB12, FOLATE, FERRITIN, TIBC, IRON, RETICCTPCT in the last 72 hours. Sepsis Labs: Recent Labs  Lab 02/09/20 0256  LATICACIDVEN 0.7    Recent Results (from the past 240 hour(s))  SARS Coronavirus 2 by RT PCR (hospital order, performed in [facility] hospital lab) Nasopharyngeal Nasopharyngeal Swab     Status: None   Collection Time: 02/05/20 12:20 AM   Specimen: Nasopharyngeal Swab  Result Value Ref Range Status   SARS Coronavirus 2 NEGATIVE NEGATIVE Final  Comment: (NOTE) SARS-CoV-2 target nucleic acids are NOT DETECTED. The SARS-CoV-2 RNA is generally detectable in upper and lower respiratory specimens during the acute phase of infection. The lowest concentration of SARS-CoV-2 viral copies this assay can detect is 250 copies / mL. A negative result does not preclude SARS-CoV-2 infection and should not be used as the sole basis for treatment or other patient management decisions.  A negative result may occur with improper specimen collection / handling, submission of specimen other than nasopharyngeal  swab, presence of viral mutation(s) within the areas targeted by this assay, and inadequate number of viral copies (<250 copies / mL). A negative result must be combined with clinical observations, patient history, and epidemiological information. Fact Sheet for Patients:   StrictlyIdeas.no Fact Sheet for Healthcare Providers: BankingDealers.co.za This test is not yet approved or cleared  by the [place] FDA and has been authorized for detection and/or diagnosis of SARS-CoV-2 by FDA under an Emergency Use Authorization (EUA).  This EUA will remain in effect (meaning this test can be used) for the duration of the COVID-19 declaration under Section 564(b)(1) of the Act, 21 U.S.C. section 360bbb-3(b)(1), unless the authorization is terminated or revoked sooner. Performed at [facility], [place] [address], [place], [place] [address]   MRSA PCR Screening     Status: None   Collection Time: 02/06/20  2:12 AM   Specimen: Nasal Mucosa; Nasopharyngeal  Result Value Ref Range Status   MRSA by PCR NEGATIVE NEGATIVE Final    Comment:        The GeneXpert MRSA Assay (FDA approved for NASAL specimens only), is one component of a comprehensive MRSA colonization surveillance program. It is not intended to diagnose MRSA infection nor to guide or monitor treatment for MRSA infections. Performed at [facility], [place] [name], [place], [place] [address]          Radiology Studies: No results found.      Scheduled Meds: . buPROPion  150 mg Oral Daily  . chlorhexidine  15 mL Mouth Rinse BID  . Chlorhexidine Gluconate Cloth  6 each Topical Daily  . docusate sodium  100 mg Oral BID  . enoxaparin (LOVENOX) injection  30 mg Subcutaneous Q24H  . escitalopram  20 mg Oral Daily  . feeding supplement (ENSURE ENLIVE)  237 mL Oral TID BM  . ferrous sulfate  325 mg Oral Q breakfast  . furosemide  20 mg  Intravenous Daily  . ipratropium-albuterol  3 mL Nebulization BID  . lisinopril  20 mg Oral Daily  . mouth rinse  15 mL Mouth Rinse q12n4p  . potassium chloride  10 mEq Oral Daily  . predniSONE  5 mg Oral Daily  . senna  1 tablet Oral BID  . vitamin B-12  1,000 mcg Oral Daily   Continuous Infusions: . ceFEPime (MAXIPIME) IV Stopped (02/13/20 0857)  . metronidazole Stopped (02/13/20 1101)     LOS: 8 days     Georgette Shell, MD  02/13/2020, 2:31 PM

## 2020-02-13 NOTE — Progress Notes (Signed)
     Subjective: 8 Days Post-Op s/p Procedure(s): RIGHT HIP, INTRAMEDULLARY NAIL FIXATION   Patient states pain is tolerable this morning, both hips feel about the same.   Objective:  PE: VITALS:   Vitals:   02/13/20 0800 02/13/20 0833 02/13/20 1100 02/13/20 1200  BP: 118/79     Pulse: 63 69    Resp: 16 (!) 22    Temp: 98.1 F (36.7 C)  98 F (36.7 C) 98.1 F (36.7 C)  TempSrc: Oral  Oral Oral  SpO2: 93% 92%    Weight:      Height:       General: laying in bed, in no acute distress MSK: Sensation intact distally, bilateral fee warm and well perfused. Incisions well healing with no signs of infection, dressings changed today.  Dosiflexion and plantarflexion intact.   LABS  No results found for this or any previous visit (from the past 24 hour(s)).  No results found.  Assessment/Plan: Active Problems:   Hip fx (HCC)   Closed nondisplaced fracture of left acetabulum (HCC)   Pressure injury of skin   Protein-calorie malnutrition, severe   Fall  Right subtrochanteric femur fracture, left periprosthetic acetabular fracture End stage rheumatoid arthritis 8 Days Post-Op s/p Procedure(s): RIGHT HIP, INTRAMEDULLARY NAIL FIXATION - continue pivot transfer only RLE and NWB LLE at discharge until follow up outpatient - PRN dressing changes, dressing changed today VTE prophylaxis - lovenox Pain control - continue current regimen Follow-up plan - Follow-up with Dr. Dion Saucier 2 weeks after discharge Dipo: per [facility] notes, patient has agreed to SNF. Ok to be discharged to SNF over the weekend from ortho perspective.   Contact information:   Weekdays 8-5 [address], [place] [phone] A fter hours and holidays please check [email or web address] for group call information for [facility]  Armida Sans 02/13/2020, 2:52 PM

## 2020-02-13 NOTE — Progress Notes (Signed)
Daily Progress Note   Patient Name: Tara Brown       Date: 02/13/2020 DOB: [birth year]  Age: 59 y.o. MRN#: [id number] Attending Physician: Alwyn Ren, MD Primary Care Physician: [name], [name] Admit Date: 02/04/2020  Reason for Consultation/Follow-up: Establishing goals of care  Subjective: I saw and examined Tara Brown today.  She was sleeping during my arrival and awoke briefly.  Reports pain is better today.  Denies any needs but states that she wants to rest.  I called and spoke with her father, Michele Mcalpine.  Discussed with Michele Mcalpine that Tara Brown is now agreeable to transition to skilled facility for rehab.  He reports that he believes this is best path forward for her as he does not feel she can safely live alone.  Length of Stay: 8  Current Medications: Scheduled Meds:   buPROPion  150 mg Oral Daily   chlorhexidine  15 mL Mouth Rinse BID   Chlorhexidine Gluconate Cloth  6 each Topical Daily   docusate sodium  100 mg Oral BID   enoxaparin (LOVENOX) injection  30 mg Subcutaneous Q24H   escitalopram  20 mg Oral Daily   feeding supplement (ENSURE ENLIVE)  237 mL Oral TID BM   ferrous sulfate  325 mg Oral Q breakfast   furosemide  20 mg Intravenous Daily   ipratropium-albuterol  3 mL Nebulization BID   lisinopril  20 mg Oral Daily   mouth rinse  15 mL Mouth Rinse q12n4p   potassium chloride  10 mEq Oral Daily   predniSONE  5 mg Oral Daily   senna  1 tablet Oral BID   vitamin B-12  1,000 mcg Oral Daily    Continuous Infusions:  metronidazole Stopped (02/13/20 1935)    PRN Meds: acetaminophen, albuterol, alum & mag hydroxide-simeth, bisacodyl, hydrALAZINE, HYDROcodone-acetaminophen, magnesium citrate, menthol-cetylpyridinium **OR** phenol,  ondansetron **OR** ondansetron (ZOFRAN) IV, polyethylene glycol, zolpidem  Physical Exam        General: Frail and chronically ill-appearing, cachectic with severe scoliosis noted Heart: Regular rate and rhythm. No murmur appreciated. Lungs: Tachypnea, decreased air movement Abdomen: Soft, nontender, nondistended, positive bowel sounds.  Ext: No significant edema, joint deformity in upper and lower extremities with noted contracture and RA nodules Skin: Warm and dry Neuro: Grossly intact, nonfocal.   Vital Signs: BP (!) 92/54  Pulse 69    Temp 98.2 F (36.8 C) (Oral)    Resp 19    Ht 5\' 4"  (1.626 m)    Wt 41.7 kg    SpO2 94%    BMI 15.78 kg/m  SpO2: SpO2: 94 % O2 Device: O2 Device: Nasal Cannula O2 Flow Rate: O2 Flow Rate (L/min): 2 L/min  Intake/output summary:   Intake/Output Summary (Last 24 hours) at 02/13/2020 1946 Last data filed at 02/13/2020 1829 Gross per 24 hour  Intake 400 ml  Output 2500 ml  Net -2100 ml   LBM: Last BM Date: 02/09/20 Baseline Weight: Weight: 47 kg Most recent weight: Weight: 41.7 kg   Flowsheet Rows     Most Recent Value  Intake Tab  Referral Department  Hospitalist  Unit at Time of Referral  Intermediate Care Unit  Palliative Care Primary Diagnosis  Trauma  Date Notified  02/10/20  Palliative Care Type  New Palliative care  Reason for referral  Clarify Goals of Care  Date of Admission  02/04/20  Date first seen by Palliative Care  02/10/20  # of days Palliative referral response time  0 Day(s)  # of days IP prior to Palliative referral  6  Clinical Assessment  Palliative Performance Scale Score  40%  Psychosocial & Spiritual Assessment  Palliative Care Outcomes  Patient/Family meeting held?  Yes  Who was at the meeting?  Patient, Father via phone      Patient Active Problem List   Diagnosis Date Noted   Fall    Pressure injury of skin 02/06/2020   Protein-calorie malnutrition, severe 02/06/2020   Hip fx ([place]) 02/05/2020    Closed nondisplaced fracture of left acetabulum ([place]) 02/05/2020   MDD (major depressive disorder) 02/17/2017   Osteoporosis 05/29/2016   HTN (hypertension) 05/29/2016   MDD (major depressive disorder), recurrent severe, without psychosis ([place]) 05/28/2016   Rheumatoid arthritis ([place]) 12/06/2015   Anemia of chronic disease 03/08/2015    Palliative Care Assessment & Plan   Patient Profile:  Recommendations/Plan: Pain: Reports better controlled today.  Continue same. Goals of care: Plan for rehab at [facility] when arrangements can be made.  When she completes rehab, she still would be candidate for hospice services at home, however, she would need to have in-home assistance for them to be able to take her onto hospice service.  Code Status:    Code Status Orders  (From admission, onward)         Start     Ordered   02/10/20 0816  Do not attempt resuscitation (DNR)  Continuous    Question Answer Comment  In the event of cardiac or respiratory ARREST Do not call a code blue   In the event of cardiac or respiratory ARREST Do not perform Intubation, CPR, defibrillation or ACLS   In the event of cardiac or respiratory ARREST Use medication by any route, position, wound care, and other measures to relive pain and suffering. May use oxygen, suction and manual treatment of airway obstruction as needed for comfort.      02/10/20 0815            Prognosis:  Guarded  Discharge Planning: To Be Determined  Care plan was discussed with patient, father  Thank you for allowing the Palliative Medicine Team to assist in the care of this patient.   Total Time 20 Prolonged Time Billed No      Greater than 50%  of this time was spent counseling and coordinating  care related to the above assessment and plan.  Romie Minus, MD  Please contact Palliative Medicine Team phone at [phone] for questions and concerns.

## 2020-02-13 NOTE — TOC Progression Note (Signed)
Transition of Care [facility]) - Progression Note    Patient Details  Name: Tara Brown MRN: [id number] Date of Birth: 14-Mar-1961  [facility] of [facility]) CM/SW Contact  Golda Acre, RN Phone Number: 02/13/2020, 11:39 AM  Clinical Narrative:    Patient has decided that she wants to go to [facility] [place]/tct-Karla Stapel[facility] at [employer].   Expected Discharge Plan: Skilled Nursing Facility Barriers to Discharge: Continued Medical Work up  Expected Discharge Plan and Services Expected Discharge Plan: Skilled Nursing Facility In-house Referral: Clinical Social Work   Post Acute Care Choice: Skilled Nursing Facility Living arrangements for the past 2 months: Apartment                                       Social Determinants of Health (SDOH) Interventions    Readmission Risk Interventions No flowsheet data found.

## 2020-02-13 NOTE — TOC Progression Note (Signed)
Transition of Care [facility]) - Progression Note    Patient Details  Name: Gennell How MRN: [id number] Date of Birth: 05/11/1961  Transition of Care [facility]) CM/SW Contact  Golda Acre, RN Phone Number: 02/13/2020, 11:56 AM  Clinical Narrative:    tct-Karla Alyson Locket with [facility] they do not require a co-pay upfromt has stated in previous notes.   Auth for [employer] started by [employer].  Will inquire as to if she has had a covid vaccination. Patient has had covid vaccinations-informed [name] of this. Spoke with the patient Robbie [facility] is acceptable and did let her know that the co-pay is not required upfront.   Expected Discharge Plan: Skilled Nursing Facility Barriers to Discharge: Continued Medical Work up  Expected Discharge Plan and Services Expected Discharge Plan: Skilled Nursing Facility In-house Referral: Clinical Social Work   Post Acute Care Choice: Skilled Nursing Facility Living arrangements for the past 2 months: Apartment                                       Social Determinants of Health (SDOH) Interventions    Readmission Risk Interventions No flowsheet data found.

## 2020-02-14 LAB — CBC
HCT: 34.8 % — ABNORMAL LOW (ref 36.0–46.0)
Hemoglobin: 10.1 g/dL — ABNORMAL LOW (ref 12.0–15.0)
MCH: 26.9 pg (ref 26.0–34.0)
MCHC: 29 g/dL — ABNORMAL LOW (ref 30.0–36.0)
MCV: 92.8 fL (ref 80.0–100.0)
Platelets: 361 10*3/uL (ref 150–400)
RBC: 3.75 MIL/uL — ABNORMAL LOW (ref 3.87–5.11)
RDW: 18.2 % — ABNORMAL HIGH (ref 11.5–15.5)
WBC: 12.6 10*3/uL — ABNORMAL HIGH (ref 4.0–10.5)
nRBC: 0 % (ref 0.0–0.2)

## 2020-02-14 LAB — COMPREHENSIVE METABOLIC PANEL
ALT: 11 U/L (ref 0–44)
AST: 26 U/L (ref 15–41)
Albumin: 2.8 g/dL — ABNORMAL LOW (ref 3.5–5.0)
Alkaline Phosphatase: 76 U/L (ref 38–126)
Anion gap: 13 (ref 5–15)
BUN: 20 mg/dL (ref 6–20)
CO2: 34 mmol/L — ABNORMAL HIGH (ref 22–32)
Calcium: 8.3 mg/dL — ABNORMAL LOW (ref 8.9–10.3)
Chloride: 86 mmol/L — ABNORMAL LOW (ref 98–111)
Creatinine, Ser: 0.57 mg/dL (ref 0.44–1.00)
GFR calc Af Amer: >60 mL/min (ref >60)
GFR calc non Af Amer: >60 mL/min (ref >60)
Glucose, Bld: 104 mg/dL — ABNORMAL HIGH (ref 70–99)
Potassium: 4.2 mmol/L (ref 3.5–5.1)
Sodium: 133 mmol/L — ABNORMAL LOW (ref 135–145)
Total Bilirubin: 0.7 mg/dL (ref 0.3–1.2)
Total Protein: 6.1 g/dL — ABNORMAL LOW (ref 6.5–8.1)

## 2020-02-14 MED ORDER — LIP MEDEX EX OINT
TOPICAL_OINTMENT | CUTANEOUS | Status: AC
  Filled 2020-02-14: qty 7

## 2020-02-14 MED ORDER — SODIUM CHLORIDE 0.9 % IV SOLN
INTRAVENOUS | Status: DC | PRN
  Administered 2020-02-14: 250 mL via INTRAVENOUS

## 2020-02-14 NOTE — Progress Notes (Signed)
CSW received a call from [place] in admissions at [place] stating [employer] authorization was denied and pt was not provided with SNF REHAB days  [facility] states they can accept pt under private pay for a month in advance at $257 a day or approx 7900.  CSW will continue to follow for D/C needs.  Dorothe Pea. Justice Aguirre  MSW, LCSW, LCAS, CCS Transitions of Care Clinical Social Worker Care Coordination Department Ph: [phone]

## 2020-02-14 NOTE — Progress Notes (Signed)
PROGRESS NOTE    Tara Brown  UXL:[id number] DOB: 1961-08-14 DOA: 02/04/2020 PCP: Iva Lento, [name]  Brief Narrative: HPI from Dr Alroy Bailiff Hocottis a 59 y.o.femalewith medical history significant for rheumatoid arthritis with extensive scoliosis, major depressive disorder, hypertension, osteopenia, insomnia severe malnutrition presents to the hospitals/p fall, found down at home by health aide was on floor for over12hours. Patient states she had a mechanical fall. She notes no loc or head trauma , but s/p fall unable to get up and note b/l hip pain and back pain. In the ED, VSS, labs stable from prior. Xray showed right subtrochanteric femur fracture, left periprosthetic acetabular fracture. Orthopedics consulted. Patient admitted for further management.   Assessment & Plan:   Active Problems:   Hip fx (HCC)   Closed nondisplaced fracture of left acetabulum (HCC)   Pressure injury of skin   Protein-calorie malnutrition, severe   Fall #1 right subtrochanteric femur fracture and left periprosthetic acetabular fracture status post right hip IM nail 02/05/2020 by Dr. Mardelle Matte. Ortho following.  #2 acute urinary retention improving after IV fluids and difficult Foley placement by urology.  Patient will have to be discharged with the Foley in place and follow-up with urology as an outpatient.  #3 rheumatoid arthritis/extensive scoliosis/osteopenia multiple falls with fracture on prednisone and weekly Fosamax. Methotrexate on hold.  #4 acute hypoxic hypercapnic respiratory failure-chest x-ray shows findings consistent with possible pulmonary edema.  Currently on 1 L of oxygen saturation above 88%.  On Lasix 20 mg daily for 3 days had to hold the dose today due to soft blood pressure.  Unasyn stopped since it was more of pulmonary edema then infiltrates consistent with pneumonia.  #5 Abla/B12/iron deficiency Baseline hemoglobin approximately around 8. She received 2  units of blood transfusion on 02/05/2020 after surgery. Status post Feraheme 1 dose 02/05/2020. Continue oral iron and B12 supplementation. Hemoglobin today is 10.  #6 essential hypertension-blood pressure is soft hold Lasix and beta-blocker.     #7 depression on Lexapro and Wellbutrin  #8 severe malnutrition follow dietary recommendations  #9 goals of care appreciate palliative input.  Patient now agrees to go to rehab then to go home.  #10 right and left sacrum stage I pressure injury present on admission continue local wound care    Pressure Injury 02/06/20 Sacrum Left;Right Stage 1 -  Intact skin with non-blanchable redness of a localized area usually over a bony prominence. (Active)  02/06/20 0235  Location: Sacrum  Location Orientation: Left;Right  Staging: Stage 1 -  Intact skin with non-blanchable redness of a localized area usually over a bony prominence.  Wound Description (Comments):   Present on Admission: Yes      Nutrition Problem: Severe Malnutrition Etiology: chronic illness(RA and scoliosis)     Signs/Symptoms: moderate fat depletion, moderate muscle depletion, severe fat depletion, severe muscle depletion    Interventions: Ensure Enlive (each supplement provides 350kcal and 20 grams of protein), Snacks  Estimated body mass index is 15.78 kg/m as calculated from the following:   Height as of this encounter: 5\' 4"  (1.626 m).   Weight as of this encounter: 41.7 kg.  DVT prophylaxis:Lovenox Code Status:DO NOT RESUSCITATE Family Communication:None at bedside Disposition Plan:Status is: Inpatient  Dispo: The patient is from:Home Anticipated d/c is to:PT OT recommends SNF insurance authorization is pending. Anticipated d/c date ZD:[id number] Patient currentlyis  medically stable to d/c.  Consultants:  Orthopedics  Procedures:Right hip IM nailing  02/05/2020 Antimicrobials:None Subjective: Patient resting in bed no new events  overnight  Objective: Vitals:   02/14/20 1014 02/14/20 1015 02/14/20 1100 02/14/20 1151  BP: (!) 102/57  (!) 126/59   Pulse: 66 67 63   Resp: 17 (!) 24 19   Temp:    97.6 F (36.4 C)  TempSrc:    Oral  SpO2: 99% 99% 98%   Weight:      Height:        Intake/Output Summary (Last 24 hours) at 02/14/2020 1247 Last data filed at 02/14/2020 0646 Gross per 24 hour  Intake 560 ml  Output 2425 ml  Net -1865 ml   Filed Weights   02/04/20 1531 02/05/20 1151 02/06/20 0246  Weight: 47 kg 47 kg 41.7 kg    Examination:  General exam: Appears calm and comfortable  Respiratory system: Clear to auscultation. Respiratory effort normal. Cardiovascular system: S1 & S2 heard, RRR. No JVD, murmurs, rubs, gallops or clicks. No pedal edema. Gastrointestinal system: Abdomen is nondistended, soft and nontender. No organomegaly or masses felt. Normal bowel sounds heard. Central nervous system: Alert and oriented. No focal neurological deficits. Extremities: Contracted upper and lower extremities Skin: No rashes, lesions or ulcers Psychiatry: Judgement and insight appear normal. Mood & affect appropriate.     Data Reviewed: I have personally reviewed following labs and imaging studies  CBC: Recent Labs  Lab 02/08/20 0650 02/09/20 0256 02/10/20 0244 02/11/20 0226  WBC 11.2* 6.8 7.4 9.0  NEUTROABS 8.6* 5.1 5.7 6.9  HGB 9.2* 11.1* 9.6* 10.0*  HCT 31.8* 37.6 33.7* 33.9*  MCV 94.9 93.8 97.4 94.2  PLT 324 258 366 424*   Basic Metabolic Panel: Recent Labs  Lab 02/08/20 0650 02/09/20 0256 02/10/20 0244 02/11/20 0226  NA 138 141 141 139  K 5.4* 4.8 4.8 3.6  CL 105 107 105 96*  CO2 26 25 29  33*  GLUCOSE 111* 95 118* 122*  BUN 33* 23* 18 19  CREATININE 0.80 0.70 0.51 0.58  CALCIUM 8.1* 8.2* 8.2* 8.2*   GFR: Estimated Creatinine Clearance: 49.2 mL/min (by C-G formula based on SCr of 0.58 mg/dL). Liver  Function Tests: No results for input(s): AST, ALT, ALKPHOS, BILITOT, PROT, ALBUMIN in the last 168 hours. No results for input(s): LIPASE, AMYLASE in the last 168 hours. No results for input(s): AMMONIA in the last 168 hours. Coagulation Profile: No results for input(s): INR, PROTIME in the last 168 hours. Cardiac Enzymes: No results for input(s): CKTOTAL, CKMB, CKMBINDEX, TROPONINI in the last 168 hours. BNP (last 3 results) No results for input(s): PROBNP in the last 8760 hours. HbA1C: No results for input(s): HGBA1C in the last 72 hours. CBG: No results for input(s): GLUCAP in the last 168 hours. Lipid Profile: No results for input(s): CHOL, HDL, LDLCALC, TRIG, CHOLHDL, LDLDIRECT in the last 72 hours. Thyroid Function Tests: No results for input(s): TSH, T4TOTAL, FREET4, T3FREE, THYROIDAB in the last 72 hours. Anemia Panel: No results for input(s): VITAMINB12, FOLATE, FERRITIN, TIBC, IRON, RETICCTPCT in the last 72 hours. Sepsis Labs: Recent Labs  Lab 02/09/20 0256  LATICACIDVEN 0.7    Recent Results (from the past 240 hour(s))  SARS Coronavirus 2 by RT PCR (hospital order, performed in [facility] hospital lab) Nasopharyngeal Nasopharyngeal Swab     Status: None   Collection Time: 02/05/20 12:20 AM   Specimen: Nasopharyngeal Swab  Result Value Ref Range Status   SARS Coronavirus 2 NEGATIVE NEGATIVE Final    Comment: (NOTE) SARS-CoV-2 target nucleic acids are NOT DETECTED. The SARS-CoV-2 RNA is generally detectable in upper  and lower respiratory specimens during the acute phase of infection. The lowest concentration of SARS-CoV-2 viral copies this assay can detect is 250 copies / mL. A negative result does not preclude SARS-CoV-2 infection and should not be used as the sole basis for treatment or other patient management decisions.  A negative result may occur with improper specimen collection / handling, submission of specimen other than nasopharyngeal swab, presence of  viral mutation(s) within the areas targeted by this assay, and inadequate number of viral copies (<250 copies / mL). A negative result must be combined with clinical observations, patient history, and epidemiological information. Fact Sheet for Patients:   BoilerBrush.com.cy Fact Sheet for Healthcare Providers: https://pope.com/ This test is not yet approved or cleared  by the [place] FDA and has been authorized for detection and/or diagnosis of SARS-CoV-2 by FDA under an Emergency Use Authorization (EUA).  This EUA will remain in effect (meaning this test can be used) for the duration of the COVID-19 declaration under Section 564(b)(1) of the Act, 21 U.S.C. section 360bbb-3(b)(1), unless the authorization is terminated or revoked sooner. Performed at [facility], [address] [place], [place] [address]   MRSA PCR Screening     Status: None   Collection Time: 02/06/20  2:12 AM   Specimen: Nasal Mucosa; Nasopharyngeal  Result Value Ref Range Status   MRSA by PCR NEGATIVE NEGATIVE Final    Comment:        The GeneXpert MRSA Assay (FDA approved for NASAL specimens only), is one component of a comprehensive MRSA colonization surveillance program. It is not intended to diagnose MRSA infection nor to guide or monitor treatment for MRSA infections. Performed at [facility], [address] [place], [place] [address]          Radiology Studies: No results found.      Scheduled Meds:  buPROPion  150 mg Oral Daily   chlorhexidine  15 mL Mouth Rinse BID   Chlorhexidine Gluconate Cloth  6 each Topical Daily   docusate sodium  100 mg Oral BID   enoxaparin (LOVENOX) injection  30 mg Subcutaneous Q24H   escitalopram  20 mg Oral Daily   feeding supplement (ENSURE ENLIVE)  237 mL Oral TID BM   ferrous sulfate  325 mg Oral Q breakfast   furosemide  20 mg Intravenous Daily    ipratropium-albuterol  3 mL Nebulization BID   lisinopril  20 mg Oral Daily   mouth rinse  15 mL Mouth Rinse q12n4p   potassium chloride  10 mEq Oral Daily   predniSONE  5 mg Oral Daily   senna  1 tablet Oral BID   vitamin B-12  1,000 mcg Oral Daily   Continuous Infusions:  sodium chloride 250 mL (02/14/20 0124)     LOS: 9 days     Alwyn Ren, MD 02/14/2020, 12:47 PM

## 2020-02-14 NOTE — Progress Notes (Signed)
[name] at [facility] who stated that per Misty Stanley at [facility] pt was denied insurance authorization.  CSW called Misty Stanley at [employer] at ph:[phone] and stated that the pt's doctor could be reached to see if a peer-to-peer could or should be completed to appeal pt's denial from [facility].  CSW provided the provider's contact info and name.   CSW awaiting return call from [place] at [facility].  CSW will continue to follow for D/C needs.  Dorothe Pea. Roza Creamer  MSW, LCSW, LCAS, CCS Transitions of Care Clinical Social Worker Care Coordination Department Ph: [phone]

## 2020-02-14 NOTE — Outcomes Assessment (Addendum)
CSW updated pt's father who stated pt is not competent to care for herself and voiced frustrations at the system that prohibits a person who makes a certain amount per month would be considered wealthy and unable to qualify for Medicaid which is the father's fear.  CSW outlined pt's options (pay private pay) or private pay then spending-down and applying for Medicaid simultaneously.   2nd shift CSW will leave handoff for 1st shift RN CM on Monday with whom the pt;s father would like to speak to gain insight.  CSW will continue to follow for D/C needs.  Dorothe Pea. Montrez Marietta  MSW, LCSW, LCAS, CCS Transitions of Care Clinical Social Worker Care Coordination Department Ph: [phone]

## 2020-02-14 NOTE — Progress Notes (Signed)
CSW received a call from pt's provider asking if pt's Tara Brown is in so pt's D/C date can be determined.   CSW awaiting return call from admissions [name] or [name]) with the answer.  CSW will continue to follow for D/C needs.  Dorothe Pea. [name]  MSW, LCSW, LCAS, CCS Transitions of Care Clinical Social Worker Care Coordination Department Ph: [phone]

## 2020-02-14 NOTE — Progress Notes (Signed)
CSW received a call from [place] in admissions at [facility] who stated pt's ins Berkley Harvey has not yet been approved.  Provider updated.  CSW will continue to follow for D/C needs.  Dorothe Pea. Diera Wirkkala  MSW, LCSW, LCAS, CCS Transitions of Care Clinical Social Worker Care Coordination Department Ph: [phone]

## 2020-02-15 NOTE — TOC Progression Note (Addendum)
Transition of Care [facility]) - Progression Note    Patient Details  Name: Alinna Siple MRN: [id number] Date of Birth: 1961-07-14  Transition of Care [facility]) CM/SW Contact  Marina Goodell Phone Number: [phone] 02/15/2020, 9:34 AM  Clinical Narrative:     CSW contacted and left voicemail for Opal Sidles at [facility] [phone], to set up peer-to-peer. Misty Stanley stated the claim had been closed because the SNF facility requested an expedited authorization, which allows for less time to do peer-to-peer and the time for that has lapsed.  Misty Stanley stated the only option left for the family to appeal the decision.  This CSW spoke Dr. Ashley Royalty for status update.  Expected Discharge Plan: Skilled Nursing Facility Barriers to Discharge: Continued Medical Work up  Expected Discharge Plan and Services Expected Discharge Plan: Skilled Nursing Facility In-house Referral: Clinical Social Work   Post Acute Care Choice: Skilled Nursing Facility Living arrangements for the past 2 months: Apartment                                       Social Determinants of Health (SDOH) Interventions    Readmission Risk Interventions No flowsheet data found.

## 2020-02-15 NOTE — Plan of Care (Signed)
  Problem: Skin Integrity: Goal: Risk for impaired skin integrity will decrease Outcome: Progressing   Problem: Clinical Measurements: Goal: Postoperative complications will be avoided or minimized Outcome: Progressing   Problem: Skin Integrity: Goal: Demonstration of wound healing without infection will improve Outcome: Progressing   

## 2020-02-15 NOTE — Progress Notes (Signed)
PROGRESS NOTE    Tara Brown  PZW:[id number] DOB: 1961/03/16 DOA: 02/04/2020 PCP: [name], [name]    Brief Narrative:HPI from Dr Silas Flood Hocottis a 59 y.o.femalewith medical history significant for rheumatoid arthritis with extensive scoliosis, major depressive disorder, hypertension, osteopenia, insomnia severe malnutrition presents to the hospitals/p fall, found down at home by health aide was on floor for over12hours. Patient states she had a mechanical fall. She notes no loc or head trauma , but s/p fall unable to get up and note b/l hip pain and back pain. In the ED, VSS, labs stable from prior. Xray showed right subtrochanteric femur fracture, left periprosthetic acetabular fracture. Orthopedics consulted. Patient admitted for further management  Assessment & Plan:   Active Problems:   Hip fx (HCC)   Closed nondisplaced fracture of left acetabulum (HCC)   Pressure injury of skin   Protein-calorie malnutrition, severe   Fall   #1right subtrochanteric femur fracture and left periprosthetic acetabular fracture status post right hip IM nail 02/05/2020 by Dr. Dion Saucier. Ortho signed off.  #2 acute urinary retention improving after IV fluids and difficult Foley placement by urology. Patient will have to be discharged with the Foley in place and follow-up with urology as an outpatient.  #3 rheumatoid arthritis/extensive scoliosis/osteopenia multiple falls with fracture on prednisone and weekly Fosamax. Methotrexate on hold.  #4 acute hypoxic hypercapnic respiratory failure-chest x-ray shows findings consistent with possible pulmonary edema.  Resolved.  Patient on room air.  #5 Abla/B12/iron deficiency Baseline hemoglobin approximately around 8. She received 2 units of blood transfusion on 02/05/2020 after surgery. Status post Feraheme 1 dose 02/05/2020. Continue oral iron and B12 supplementation. Hemoglobin today is 10.  #6 essential  hypertension-blood pressure 140/73.  On beta-blocker.  #7 depression on Lexapro and Wellbutrin  #8 severe malnutrition follow dietary recommendations  #9 goals of care appreciate palliative input.Patient now agrees to go to rehab then to go home.  Pending SNF.  #10 right and left sacrum stage I pressure injury present on admission continue local wound care  Pressure Injury 02/06/20 Sacrum Left;Right Stage 1 -  Intact skin with non-blanchable redness of a localized area usually over a bony prominence. (Active)  02/06/20 0235  Location: Sacrum  Location Orientation: Left;Right  Staging: Stage 1 -  Intact skin with non-blanchable redness of a localized area usually over a bony prominence.  Wound Description (Comments):   Present on Admission: Yes      Nutrition Problem: Severe Malnutrition Etiology: chronic illness(RA and scoliosis)     Signs/Symptoms: moderate fat depletion, moderate muscle depletion, severe fat depletion, severe muscle depletion    Interventions: Ensure Enlive (each supplement provides 350kcal and 20 grams of protein), Snacks  Estimated body mass index is 15.78 kg/m as calculated from the following:   Height as of this encounter: 5\' 4"  (1.626 m).   Weight as of this encounter: 41.7 kg.  DVT prophylaxis:Lovenox Code Status:DO NOT RESUSCITATE Family Communication:None at bedside Disposition Plan:Status is: Inpatient  Dispo: The patient is from:Home Anticipated d/c is to:PT OT recommends SNFinsurance authorization is pending. Anticipated d/c date Patient currentlyis medically stable to d/c.  Pending SNF  Consultants:  Orthopedics  Procedures:Right hip IM nailing 02/05/2020 Antimicrobials:None  Subjective:  No complaints resting in bed in no acute distress anxious to go to rehab Objective: Vitals:   02/15/20 0204 02/15/20 0842 02/15/20 1029 02/15/20 1347  BP: 123/61  100/85  140/73  Pulse: 68 72 66 70  Resp: 15 14 18 16   Temp: 98.3  F (36.8 C)  97.9 F (36.6 C)   TempSrc: Oral  Oral   SpO2: 92% 96% 98% 98%  Weight:      Height:        Intake/Output Summary (Last 24 hours) at 02/15/2020 1444 Last data filed at 02/15/2020 1409 Gross per 24 hour  Intake 1816 ml  Output 1700 ml  Net 116 ml   Filed Weights   02/04/20 1531 02/05/20 1151 02/06/20 0246  Weight: 47 kg 47 kg 41.7 kg    Examination:  General exam: Appears calm and comfortable  Respiratory system: Clear to auscultation. Respiratory effort normal. Cardiovascular system: S1 & S2 heard, RRR. No JVD, murmurs, rubs, gallops or clicks. No pedal edema. Gastrointestinal system: Abdomen is nondistended, soft and nontender. No organomegaly or masses felt. Normal bowel sounds heard. Central nervous system: Alert and oriented. No focal neurological deficits. Extremities: Upper and lower extremities with contractures Skin: No rashes, lesions or ulcers Psychiatry: Judgement and insight appear normal. Mood & affect appropriate.     Data Reviewed: I have personally reviewed following labs and imaging studies  CBC: Recent Labs  Lab 02/09/20 0256 02/10/20 0244 02/11/20 0226 02/14/20 1318  WBC 6.8 7.4 9.0 12.6*  NEUTROABS 5.1 5.7 6.9  --   HGB 11.1* 9.6* 10.0* 10.1*  HCT 37.6 33.7* 33.9* 34.8*  MCV 93.8 97.4 94.2 92.8  PLT 258 366 424* 361   Basic Metabolic Panel: Recent Labs  Lab 02/09/20 0256 02/10/20 0244 02/11/20 0226 02/14/20 1318  NA 141 141 139 133*  K 4.8 4.8 3.6 4.2  CL 107 105 96* 86*  CO2 25 29 33* 34*  GLUCOSE 95 118* 122* 104*  BUN 23* 18 19 20   CREATININE 0.70 0.51 0.58 0.57  CALCIUM 8.2* 8.2* 8.2* 8.3*   GFR: Estimated Creatinine Clearance: 49.2 mL/min (by C-G formula based on SCr of 0.57 mg/dL). Liver Function Tests: Recent Labs  Lab 02/14/20 1318  AST 26  ALT 11  ALKPHOS 76  BILITOT 0.7  PROT 6.1*  ALBUMIN 2.8*   No results for input(s): LIPASE, AMYLASE in  the last 168 hours. No results for input(s): AMMONIA in the last 168 hours. Coagulation Profile: No results for input(s): INR, PROTIME in the last 168 hours. Cardiac Enzymes: No results for input(s): CKTOTAL, CKMB, CKMBINDEX, TROPONINI in the last 168 hours. BNP (last 3 results) No results for input(s): PROBNP in the last 8760 hours. HbA1C: No results for input(s): HGBA1C in the last 72 hours. CBG: No results for input(s): GLUCAP in the last 168 hours. Lipid Profile: No results for input(s): CHOL, HDL, LDLCALC, TRIG, CHOLHDL, LDLDIRECT in the last 72 hours. Thyroid Function Tests: No results for input(s): TSH, T4TOTAL, FREET4, T3FREE, THYROIDAB in the last 72 hours. Anemia Panel: No results for input(s): VITAMINB12, FOLATE, FERRITIN, TIBC, IRON, RETICCTPCT in the last 72 hours. Sepsis Labs: Recent Labs  Lab 02/09/20 0256  LATICACIDVEN 0.7    Recent Results (from the past 240 hour(s))  MRSA PCR Screening     Status: None   Collection Time: 02/06/20  2:12 AM   Specimen: Nasal Mucosa; Nasopharyngeal  Result Value Ref Range Status   MRSA by PCR NEGATIVE NEGATIVE Final    Comment:        The GeneXpert MRSA Assay (FDA approved for NASAL specimens only), is one component of a comprehensive MRSA colonization surveillance program. It is not intended to diagnose MRSA infection nor to guide or monitor treatment for MRSA infections. Performed at [facility], [place] [address], [place], [place] [address]          Radiology Studies: No results found.      Scheduled Meds: . buPROPion  150 mg Oral Daily  . chlorhexidine  15 mL Mouth Rinse BID  . Chlorhexidine Gluconate Cloth  6 each Topical Daily  . docusate sodium  100 mg Oral BID  . enoxaparin (LOVENOX) injection  30 mg Subcutaneous Q24H  . escitalopram  20 mg Oral Daily  . feeding supplement (ENSURE ENLIVE)  237 mL Oral TID BM  . ferrous sulfate  325 mg Oral Q breakfast  . furosemide  20 mg  Intravenous Daily  . ipratropium-albuterol  3 mL Nebulization BID  . lisinopril  20 mg Oral Daily  . mouth rinse  15 mL Mouth Rinse q12n4p  . potassium chloride  10 mEq Oral Daily  . predniSONE  5 mg Oral Daily  . senna  1 tablet Oral BID  . vitamin B-12  1,000 mcg Oral Daily   Continuous Infusions: . sodium chloride 250 mL (02/14/20 0124)     LOS: 10 days     Georgette Shell, MD 02/15/2020, 2:44 PM

## 2020-02-16 NOTE — Plan of Care (Signed)
  Problem: Pain Managment: Goal: General experience of comfort will improve Outcome: Progressing   Problem: Safety: Goal: Ability to remain free from injury will improve Outcome: Progressing   Problem: Skin Integrity: Goal: Risk for impaired skin integrity will decrease Outcome: Progressing   Problem: Clinical Measurements: Goal: Postoperative complications will be avoided or minimized Outcome: Progressing   Problem: Skin Integrity: Goal: Demonstration of wound healing without infection will improve Outcome: Progressing   

## 2020-02-16 NOTE — Plan of Care (Signed)
  Problem: Education: Goal: Knowledge of General Education information will improve Description: Including pain rating scale, medication(s)/side effects and non-pharmacologic comfort measures Outcome: Progressing   Problem: Pain Managment: Goal: General experience of comfort will improve Outcome: Progressing   Problem: Safety: Goal: Ability to remain free from injury will improve Outcome: Progressing   Problem: Skin Integrity: Goal: Risk for impaired skin integrity will decrease Outcome: Progressing   Problem: Clinical Measurements: Goal: Postoperative complications will be avoided or minimized Outcome: Progressing   Problem: Skin Integrity: Goal: Demonstration of wound healing without infection will improve Outcome: Progressing

## 2020-02-16 NOTE — TOC Progression Note (Signed)
Transition of Care [facility]) - Progression Note    Patient Details  Name: Tara Brown MRN: [id number] Date of Birth: 12/16/1960  Transition of Care [facility]) CM/SW [place], [place] Phone Number: 02/16/2020, 2:30 PM  Clinical Narrative:    CSW reached out to the at [place] Lattie Haw [phone]  2x to follow up with the appeals process, left voicemail.   CSW received a call from the patient father Abbe Amsterdam. He is concern about the Aetna denial for rehab placement at SNF. He express the patient cannot return home because there is no in her family that can provide 24/7. He states the patient lives a home alone and has tried to be independent with some support. He reports it is no longer safe for the patient to go home and needs to stay at facility for long term.  The patient does not have funds to private pay for skilled nursing care.    CSW met with the patient and her sister Andee Poles at bedside. CSW explain the Aetna denial and attempt to call the case manager for the appeal. The patient reports understanding. CSW discussed plan of care, if [place] does not uphold the appeal.  CSW inquired about the patient care at home. She has personal care services 5 days a week for 2 hours on M.W, F and 3 hours on T and TH through [facility]. Patient sister is concern about the patient returning home unable to walk and unable to care for herself. Patient family express the need for long term care. Patient express her concern about nursing home placement. She would like to go home "just one more time" to determine if she can do it by herself. Patient sister express concerns about the patient safety and inability to care for her self, and her pet cat.  Patient requested to discuss her plan of care with the physician. After her discussion with the palliative physician and hospitalist, she is now agreeable to SNF.    CSW explain SNF process Pending medicaid. CSW explain there are certain facilities  that will accept medicaid pending. Patient and her sister reports understanding. Patient sister asked to search facilities in [place] if possible.  CSW faxed FL2 out to SNF's in the [place] area and [place] indicating the patient need for [facility] pending, with the hopes the facility will transition her current disability funds to [employer].   CSW reached out to [place] again at [facility] and explain the situation. She has requested her business office review. CSW waiting for a decision.    Expected Discharge Plan: Skilled Nursing Facility Barriers to Discharge: No SNF bed, Continued Medical Work up, SNF Authorization Denied, Other (comment)  Expected Discharge Plan and Services Expected Discharge Plan: [place] In-house Referral: Clinical Social Work   Post Acute Care Choice: [place] Living arrangements for the past 2 months: Apartment                                       Social Determinants of Health (SDOH) Interventions    Readmission Risk Interventions Readmission Risk Prevention Plan 02/16/2020  Transportation Screening Complete  PCP or Specialist Appt within 5-7 Days Not Complete  Home Care Screening Complete  Medication Review (RN CM) Complete  Some recent data might be hidden

## 2020-02-16 NOTE — Care Management Important Message (Signed)
Important Message  Patient Details  Name: Tara Brown MRN: [id number] Date of Birth: 13-Jan-1961   Medicare Important Message Given:     Document was given to [name], LCSW  to give to the patient.    Chari Manning 02/16/2020, 10:40 AM

## 2020-02-16 NOTE — Progress Notes (Signed)
Physical Therapy Treatment Patient Details Name: Tara Brown MRN: [id number] DOB: 08/04/1961 Today's Date: 02/16/2020    History of Present Illness Patient is a 59 year old female with PMH significant for scoliosis, RA, multiple L hip sx's due to dislocations of THA, toe amputation admitted after a fall. Patient is s/p R hip IM nailing, pt also has L periarticular acetabular fracture being treated conservatively. Pt had declining resp status on 5/31 requiring bipap and was transferred to stepdown.    PT Comments    Pt cooperative with LE and UE exercises. Worked on turning/encouraged pressure relief/educated pt. Continue to recommend SNF post acute. Pt was ambulatory and living fairly independently prior to this injury, really unsure why she was denied SNF. Pt would benefit from SNF to maximize mobility and safety. She is currently at risk for multiple medical complications associated with bedrest. Will continue to follow in acute setting. Pt would need near 24hour assist if she d/c's home at current status.    Follow Up Recommendations  SNF;Supervision/Assistance - 24 hour     Equipment Recommendations  None recommended by PT    Recommendations for Other Services       Precautions / Restrictions Precautions Precautions: Posterior Hip;Other (comment) Precaution Comments: hip abduction pillow for comfort, S/P multiple dislocations lt hip, last surgery for constrained liner. transfers only Restrictions Weight Bearing Restrictions: Yes RLE Weight Bearing: Partial weight bearing RLE Partial Weight Bearing Percentage or Pounds: no specified LLE Weight Bearing: Non weight bearing    Mobility  Bed Mobility Overal bed mobility: Needs Assistance Bed Mobility: Rolling Rolling: Min assist         General bed mobility comments: cues to reach and grip rail, assist to perform full turn. encouraged pt to perform turns if even partial turn on her own to allow pressure relief and prevent  skin breakdown  Transfers                    Ambulation/Gait                 Stairs             Wheelchair Mobility    Modified Rankin (Stroke Patients Only)       Balance                                            Cognition Arousal/Alertness: Awake/alert Behavior During Therapy: WFL for tasks assessed/performed;Flat affect Overall Cognitive Status: No family/caregiver present to determine baseline cognitive functioning                                 General Comments: pt very alert today      Exercises General Exercises - Lower Extremity Ankle Circles/Pumps: AROM;10 reps;Supine Quad Sets: AROM;Both;10 reps;Supine Heel Slides: AAROM;Right;AROM;Left;10 reps;Supine  bil shoulder flexion AAROM x 10 reps.  bil shoulder Adduction x 10 reps  Elbow (fused bil) flex/ext withing possible range x5 bil    General Comments        Pertinent Vitals/Pain Pain Assessment: Faces Faces Pain Scale: Hurts even more Pain Location: bil legs with movement, R >L  Pain Descriptors / Indicators: Discomfort;Grimacing;Sore Pain Intervention(s): Limited activity within patient's tolerance;Monitored during session;Repositioned    Home Living  Prior Function            PT Goals (current goals can now be found in the care plan section) Acute Rehab PT Goals Patient Stated Goal: go to rehab PT Goal Formulation: With patient Time For Goal Achievement: 02/20/20 Potential to Achieve Goals: Fair Progress towards PT goals: Progressing toward goals    Frequency    Min 3X/week      PT Plan Current plan remains appropriate    Co-evaluation              AM-PAC PT "6 Clicks" Mobility   Outcome Measure  Help needed turning from your back to your side while in a flat bed without using bedrails?: A Little Help needed moving from lying on your back to sitting on the side of a flat bed without using  bedrails?: A Lot Help needed moving to and from a bed to a chair (including a wheelchair)?: Total Help needed standing up from a chair using your arms (e.g., wheelchair or bedside chair)?: Total Help needed to walk in hospital room?: Total Help needed climbing 3-5 steps with a railing? : Total 6 Click Score: 9    End of Session   Activity Tolerance: Patient tolerated treatment well;Patient limited by pain Patient left: in bed;with call bell/phone within reach;with bed alarm set Nurse Communication: Mobility status PT Visit Diagnosis: Unsteadiness on feet (R26.81);Difficulty in walking, not elsewhere classified (R26.2);Repeated falls (R29.6);Pain Pain - Right/Left: Right Pain - part of body: Leg     Time: 2122-4825 PT Time Calculation (min) (ACUTE ONLY): 19 min  Charges:  $Therapeutic Exercise: 8-22 mins                     Baxter Flattery, PT   Acute Rehab Dept [facility]): [phone]   02/16/2020    [facility] 02/16/2020, 9:55 AM

## 2020-02-16 NOTE — Progress Notes (Signed)
° ° ° °  Subjective: 11 Days Post-Op s/p Procedure(s): RIGHT HIP, INTRAMEDULLARY NAIL FIXATION   Patient is alert states pain is "manageable" this morning.  Objective:  PE: VITALS:   Vitals:   02/15/20 2107 02/16/20 0519 02/16/20 0806 02/16/20 1322  BP:  119/75  131/77  Pulse:  64  68  Resp:  15  18  Temp:  97.8 F (36.6 C)  98.4 F (36.9 C)  TempSrc:    Oral  SpO2: 98% 93% 93% 96%  Weight:      Height:       General: laying in bed, in no acute distress MSK: Sensation intact distally, bilateral feet warm and well perfused. Dressing C/D/I.  Dosiflexion and plantarflexion intact.   LABS  No results found for this or any previous visit (from the past 24 hour(s)).  No results found.  Assessment/Plan: Active Problems:   Hip fx (HCC)   Closed nondisplaced fracture of left acetabulum (HCC)   Pressure injury of skin   Protein-calorie malnutrition, severe   Fall  Right subtrochanteric femur fracture, left periprosthetic acetabular fracture End stage rheumatoid arthritis 11 Days Post-Op s/p Procedure(s): RIGHT HIP, INTRAMEDULLARY NAIL FIXATION - continue pivot transfer only RLE and NWB LLE at discharge until follow up outpatient, continue PT - PRN dressing changes VTE prophylaxis - lovenox Pain control - continue current regimen Follow-up plan - Follow-up with Dr. Dion Saucier 2 weeks after discharge Dispo: SNF if patient able to qualify.   Contact information:   Weekdays 8-5 Janine Ores, [place] [phone] A fter hours and holidays please check [email or web address] for group call information for [facility]  Armida Sans 02/16/2020, 3:29 PM

## 2020-02-16 NOTE — Progress Notes (Signed)
PROGRESS NOTE    Tara Brown  LOV:[id number] DOB: [birth year] DOA: 02/04/2020 PCP: Iva Lento, [name]    Brief Narrative:HPI from Dr Alroy Bailiff Hocottis a 59 y.o.femalewith medical history significant for rheumatoid arthritis with extensive scoliosis, major depressive disorder, hypertension, osteopenia, insomnia severe malnutrition presents to the hospitals/p fall, found down at home by health aide was on floor for over12hours. Patient states she had a mechanical fall. She notes no loc or head trauma , but s/p fall unable to get up and note b/l hip pain and back pain. In the ED, VSS, labs stable from prior. Xray showed right subtrochanteric femur fracture, left periprosthetic acetabular fracture. Orthopedics consulted. Patient admitted for further management  Assessment & Plan:   Active Problems:   Hip fx (HCC)   Closed nondisplaced fracture of left acetabulum (HCC)   Pressure injury of skin   Protein-calorie malnutrition, severe   Fall   #1right subtrochanteric femur fracture and left periprosthetic acetabular fracture status post right hip IM nail 02/05/2020 by Dr. Mardelle Matte. Ortho signed off.  #2 acute urinary retention improving after IV fluids and difficult Foley placement by urology. Patient will have to be discharged with the Foley in place and follow-up with urology as an outpatient.  #3 rheumatoid arthritis/extensive scoliosis/osteopenia multiple falls with fracture on prednisone and weekly Fosamax. Methotrexate on hold.  #4 acute hypoxic hypercapnic respiratory failure-chest x-ray shows findings consistent with possible pulmonary edema.  Resolved.  Patient on room air.  #5 Abla/B12/iron deficiency Baseline hemoglobin approximately around 8. She received 2 units of blood transfusion on 02/05/2020 after surgery. Status post Feraheme 1 dose 02/05/2020. Continue oral iron and B12 supplementation. Stable  #6 essential hypertension-blood pressure  140/73.  On beta-blocker.  #7 depression on Lexapro and Wellbutrin  #8 severe malnutrition follow dietary recommendations  #9 goals of care appreciate palliative input.  Pending SNF/ltac  #10 right and left sacrum stage I pressure injury present on admission continue local wound care  Pressure Injury 02/06/20 Sacrum Left;Right Stage 1 -  Intact skin with non-blanchable redness of a localized area usually over a bony prominence. (Active)  02/06/20 0235  Location: Sacrum  Location Orientation: Left;Right  Staging: Stage 1 -  Intact skin with non-blanchable redness of a localized area usually over a bony prominence.  Wound Description (Comments):   Present on Admission: Yes      Nutrition Problem: Severe Malnutrition Etiology: chronic illness(RA and scoliosis)     Signs/Symptoms: moderate fat depletion, moderate muscle depletion, severe fat depletion, severe muscle depletion    Interventions: Ensure Enlive (each supplement provides 350kcal and 20 grams of protein), Snacks  Estimated body mass index is 15.78 kg/m as calculated from the following:   Height as of this encounter: 5\' 4"  (1.626 m).   Weight as of this encounter: 41.7 kg.  DVT prophylaxis:Lovenox Code Status:DO NOT RESUSCITATE Family Communication:None at bedside Disposition Plan:Status is: Inpatient  Dispo: The patient is from:Home Anticipated d/c is to:PT OT recommends SNFinsurance authorization is pending. Anticipated d/c date OA:[id number] Patient currentlyis medically stable to d/c.  Pending SNF  Consultants:  Orthopedics  Procedures:Right hip IM nailing 02/05/2020 Antimicrobials:None  Subjective: anxiuos to go somewhere   Objective: Vitals:   02/15/20 2106 02/15/20 2107 02/16/20 0519 02/16/20 0806  BP: 127/70  119/75   Pulse: 62  64   Resp: 16  15   Temp: 97.8 F (36.6 C)  97.8 F (36.6 C)   TempSrc:      SpO2: 99% 98% 93% 93%  Weight:      Height:        Intake/Output Summary (Last 24 hours) at 02/16/2020 1314 Last data filed at 02/16/2020 1000 Gross per 24 hour  Intake 1434 ml  Output 2750 ml  Net -1316 ml   Filed Weights   02/04/20 1531 02/05/20 1151 02/06/20 0246  Weight: 47 kg 47 kg 41.7 kg    Examination:  General exam: Appears calm and comfortable  Respiratory system: Clear to auscultation. Respiratory effort normal. Cardiovascular system: S1 & S2 heard, RRR. No JVD, murmurs, rubs, gallops or clicks. No pedal edema. Gastrointestinal system: Abdomen is nondistended, soft and nontender. No organomegaly or masses felt. Normal bowel sounds heard. Central nervous system: Alert and oriented. No focal neurological deficits. Extremities: contracted hands and legs Skin: No rashes, lesions or ulcers Psychiatry: Judgement and insight appear normal. Mood & affect appropriate.     Data Reviewed: I have personally reviewed following labs and imaging studies  CBC: Recent Labs  Lab 02/10/20 0244 02/11/20 0226 02/14/20 1318  WBC 7.4 9.0 12.6*  NEUTROABS 5.7 6.9  --   HGB 9.6* 10.0* 10.1*  HCT 33.7* 33.9* 34.8*  MCV 97.4 94.2 92.8  PLT 366 424* 361   Basic Metabolic Panel: Recent Labs  Lab 02/10/20 0244 02/11/20 0226 02/14/20 1318  NA 141 139 133*  K 4.8 3.6 4.2  CL 105 96* 86*  CO2 29 33* 34*  GLUCOSE 118* 122* 104*  BUN 18 19 20   CREATININE 0.51 0.58 0.57  CALCIUM 8.2* 8.2* 8.3*   GFR: Estimated Creatinine Clearance: 49.2 mL/min (by C-G formula based on SCr of 0.57 mg/dL). Liver Function Tests: Recent Labs  Lab 02/14/20 1318  AST 26  ALT 11  ALKPHOS 76  BILITOT 0.7  PROT 6.1*  ALBUMIN 2.8*   No results for input(s): LIPASE, AMYLASE in the last 168 hours. No results for input(s): AMMONIA in the last 168 hours. Coagulation Profile: No results for input(s): INR, PROTIME in the last 168 hours. Cardiac Enzymes: No results for input(s): CKTOTAL, CKMB, CKMBINDEX, TROPONINI in the  last 168 hours. BNP (last 3 results) No results for input(s): PROBNP in the last 8760 hours. HbA1C: No results for input(s): HGBA1C in the last 72 hours. CBG: No results for input(s): GLUCAP in the last 168 hours. Lipid Profile: No results for input(s): CHOL, HDL, LDLCALC, TRIG, CHOLHDL, LDLDIRECT in the last 72 hours. Thyroid Function Tests: No results for input(s): TSH, T4TOTAL, FREET4, T3FREE, THYROIDAB in the last 72 hours. Anemia Panel: No results for input(s): VITAMINB12, FOLATE, FERRITIN, TIBC, IRON, RETICCTPCT in the last 72 hours. Sepsis Labs: No results for input(s): PROCALCITON, LATICACIDVEN in the last 168 hours.  No results found for this or any previous visit (from the past 240 hour(s)).       Radiology Studies: No results found.      Scheduled Meds: . buPROPion  150 mg Oral Daily  . chlorhexidine  15 mL Mouth Rinse BID  . Chlorhexidine Gluconate Cloth  6 each Topical Daily  . docusate sodium  100 mg Oral BID  . enoxaparin (LOVENOX) injection  30 mg Subcutaneous Q24H  . escitalopram  20 mg Oral Daily  . feeding supplement (ENSURE ENLIVE)  237 mL Oral TID BM  . ferrous sulfate  325 mg Oral Q breakfast  . ipratropium-albuterol  3 mL Nebulization BID  . lisinopril  20 mg Oral Daily  . mouth rinse  15 mL Mouth Rinse q12n4p  . potassium chloride  10 mEq  Oral Daily  . predniSONE  5 mg Oral Daily  . senna  1 tablet Oral BID  . vitamin B-12  1,000 mcg Oral Daily   Continuous Infusions: . sodium chloride 250 mL (02/14/20 0124)     LOS: 11 days     Alwyn Ren, MD 02/16/2020, 1:14 PM

## 2020-02-16 NOTE — Progress Notes (Signed)
Notified Dr. Ashley Royalty about patient feels hopeless/ very discouraged/ tearful about going to a long term facility. Talked with the patient a long time about this but says that she does not want to talk right now. Will continue to monitor patient status closely.

## 2020-02-17 DIAGNOSIS — S32302A Unspecified fracture of left ilium, initial encounter for closed fracture: Secondary | ICD-10-CM

## 2020-02-17 LAB — CBC
HCT: 33.8 % — ABNORMAL LOW (ref 36.0–46.0)
Hemoglobin: 10.1 g/dL — ABNORMAL LOW (ref 12.0–15.0)
MCH: 26.8 pg (ref 26.0–34.0)
MCHC: 29.9 g/dL — ABNORMAL LOW (ref 30.0–36.0)
MCV: 89.7 fL (ref 80.0–100.0)
Platelets: 334 10*3/uL (ref 150–400)
RBC: 3.77 MIL/uL — ABNORMAL LOW (ref 3.87–5.11)
RDW: 18.5 % — ABNORMAL HIGH (ref 11.5–15.5)
WBC: 10.6 10*3/uL — ABNORMAL HIGH (ref 4.0–10.5)
nRBC: 0 % (ref 0.0–0.2)

## 2020-02-17 LAB — COMPREHENSIVE METABOLIC PANEL
ALT: 11 U/L (ref 0–44)
AST: 20 U/L (ref 15–41)
Albumin: 2.9 g/dL — ABNORMAL LOW (ref 3.5–5.0)
Alkaline Phosphatase: 102 U/L (ref 38–126)
Anion gap: 8 (ref 5–15)
BUN: 16 mg/dL (ref 6–20)
CO2: 35 mmol/L — ABNORMAL HIGH (ref 22–32)
Calcium: 8.7 mg/dL — ABNORMAL LOW (ref 8.9–10.3)
Chloride: 90 mmol/L — ABNORMAL LOW (ref 98–111)
Creatinine, Ser: 0.61 mg/dL (ref 0.44–1.00)
GFR calc Af Amer: >60 mL/min (ref >60)
GFR calc non Af Amer: >60 mL/min (ref >60)
Glucose, Bld: 100 mg/dL — ABNORMAL HIGH (ref 70–99)
Potassium: 4.9 mmol/L (ref 3.5–5.1)
Sodium: 133 mmol/L — ABNORMAL LOW (ref 135–145)
Total Bilirubin: 0.5 mg/dL (ref 0.3–1.2)
Total Protein: 6.9 g/dL (ref 6.5–8.1)

## 2020-02-17 LAB — SARS CORONAVIRUS 2 BY RT PCR (HOSPITAL ORDER, PERFORMED IN ~~LOC~~ HOSPITAL LAB): SARS Coronavirus 2: NEGATIVE

## 2020-02-17 MED ORDER — LISINOPRIL 5 MG PO TABS
5.0000 mg | ORAL_TABLET | Freq: Every day | ORAL | Status: DC
  Administered 2020-02-18: 5 mg via ORAL
  Filled 2020-02-17: qty 1

## 2020-02-17 MED ORDER — SODIUM CHLORIDE 0.9 % IV SOLN: INTRAVENOUS | Status: DC

## 2020-02-17 NOTE — TOC Progression Note (Signed)
Transition of Care [facility]) - Progression Note    Patient Details  Name: Tara Brown MRN: [id number] Date of Birth: March 20, 1961  [facility]) CM/SW Contact  Clearance Coots, LCSW Phone Number: 02/17/2020, 3:25 PM  Clinical Narrative:    Financial Advisor Erie Noe @ [facility] returned call. She will complete a medicaid application with the patient today.    Expected Discharge Plan: Skilled Nursing Facility Barriers to Discharge: Other (comment)(Covid-19 Test Pending.)  Expected Discharge Plan and Services Expected Discharge Plan: Skilled Nursing Facility In-house Referral: Clinical Social Work   Post Acute Care Choice: Skilled Nursing Facility Living arrangements for the past 2 months: Apartment                                       Social Determinants of Health (SDOH) Interventions    Readmission Risk Interventions Readmission Risk Prevention Plan 02/16/2020  Transportation Screening Complete  PCP or Specialist Appt within 5-7 Days Not Complete  Home Care Screening Complete  Medication Review (RN CM) Complete  Some recent data might be hidden

## 2020-02-17 NOTE — NC FL2 (Addendum)
[place] LEVEL OF CARE SCREENING TOOL     IDENTIFICATION  Patient Name: Tara Brown Birthdate: [birth year] Sex: female Admission Date (Current Location): 02/04/2020  [facility] and [place] Number:  Herbalist and Address:  [facility],  [place], [place]      Provider Number: [id number]  Attending Physician Name and Address:  Georgette Shell, MD  Relative Name and Phone Number:    [place], [place] Father [phone]      Current Level of Care: Hospital Recommended Level of Care: [place] Prior Approval Number:    Date Approved/Denied:   PASRR Number:   [id number]  Discharge Plan: SNF    Current Diagnoses: Patient Active Problem List   Diagnosis Date Noted  . Fall   . Pressure injury of skin 02/06/2020  . Protein-calorie malnutrition, severe 02/06/2020  . Hip fx ([place]) 02/05/2020  . Closed nondisplaced fracture of left acetabulum ([place]) 02/05/2020  . MDD (major depressive disorder) 02/17/2017  . Osteoporosis 05/29/2016  . HTN (hypertension) 05/29/2016  . MDD (major depressive disorder), recurrent severe, without psychosis ([place]) 05/28/2016  . Rheumatoid arthritis ([place]) 12/06/2015  . Anemia of chronic disease 03/08/2015    Orientation RESPIRATION BLADDER Height & Weight     Self, Time, Situation, Place  Normal  Continent Weight: 93 lb 14.7 oz (42.6 kg) Height:  5\' 4"  (162.6 cm)  BEHAVIORAL SYMPTOMS/MOOD NEUROLOGICAL BOWEL NUTRITION STATUS      Continent Diet(see dc summary)  AMBULATORY STATUS COMMUNICATION OF NEEDS Skin   Extensive Assist Verbally Surgical wounds    Stage I Pressure Injury on Sacrum                   Personal Care Assistance Level of Assistance  Bathing, Feeding, Dressing Bathing Assistance: Maximum assistance Feeding assistance: Limited assistance Dressing Assistance: Maximum assistance     Functional Limitations Info  Sight, Hearing, Speech Sight Info:  Adequate Hearing Info: Adequate Speech Info: Adequate    SPECIAL CARE FACTORS FREQUENCY  PT (By licensed PT), OT (By licensed OT)     PT Frequency: 5x week OT Frequency: 3x week            Contractures Contractures Info: Present(bilateral hands)    Additional Factors Info  Code Status, Allergies, Psychotropic Code Status Info: Full Allergies Info: NKA Psychotropic Info: Wellbutrin, Lexapro         Current Medications (02/17/2020):  This is the current hospital active medication list Current Facility-Administered Medications  Medication Dose Route Frequency Provider Last Rate Last Admin  . 0.9 %  sodium chloride infusion   Intravenous PRN Georgette Shell, MD 10 mL/hr at 02/14/20 0124 250 mL at 02/14/20 0124  . acetaminophen (TYLENOL) tablet 325-650 mg  325-650 mg Oral Q6H PRN Merlene Pulling K, [name]      . albuterol (PROVENTIL) (2.5 MG/3ML) 0.083% nebulizer solution 2.5 mg  2.5 mg Nebulization Q4H PRN Alma Friendly, MD      . alum & mag hydroxide-simeth (MAALOX/MYLANTA) 200-200-20 MG/5ML suspension 30 mL  30 mL Oral Q4H PRN Merlene Pulling K, [name]   30 mL at 02/13/20 1752  . bisacodyl (DULCOLAX) suppository 10 mg  10 mg Rectal Daily PRN Merlene Pulling K, [name]      . buPROPion (WELLBUTRIN XL) 24 hr tablet 150 mg  150 mg Oral Daily Merlene Pulling K, [name]   150 mg at 02/17/20 0944  . chlorhexidine (PERIDEX) 0.12 % solution 15 mL  15 mL  Mouth Rinse BID Briant Cedar, MD   15 mL at 02/16/20 2110  . Chlorhexidine Gluconate Cloth 2 % PADS 6 each  6 each Topical Daily Briant Cedar, MD   6 each at 02/17/20 [phone]  . docusate sodium (COLACE) capsule 100 mg  100 mg Oral BID Janine Ores K, [name]   100 mg at 02/16/20 2111  . enoxaparin (LOVENOX) injection 30 mg  30 mg Subcutaneous Q24H Albina Billet III, [name]   30 mg at 02/17/20 0947  . escitalopram (LEXAPRO) tablet 20 mg  20 mg Oral Daily Janine Ores K, [name]   20 mg at 02/17/20 0944  . feeding supplement  (ENSURE ENLIVE) (ENSURE ENLIVE) liquid 237 mL  237 mL Oral TID BM Briant Cedar, MD   237 mL at 02/17/20 0945  . ferrous sulfate tablet 325 mg  325 mg Oral Q breakfast Briant Cedar, MD   325 mg at 02/15/20 0919  . hydrALAZINE (APRESOLINE) injection 10 mg  10 mg Intravenous Q8H PRN Briant Cedar, MD   10 mg at 02/12/20 0720  . HYDROcodone-acetaminophen (NORCO) 10-325 MG per tablet 1 tablet  1 tablet Oral Q4H PRN Janine Ores K, [name]   1 tablet at 02/17/20 1155  . lisinopril (ZESTRIL) tablet 20 mg  20 mg Oral Daily Briant Cedar, MD   Stopped at 02/17/20 0945  . MEDLINE mouth rinse  15 mL Mouth Rinse q12n4p Briant Cedar, MD   15 mL at 02/16/20 1159  . menthol-cetylpyridinium (CEPACOL) lozenge 3 mg  1 lozenge Oral PRN Janine Ores K, [name]       Or  . phenol (CHLORASEPTIC) mouth spray 1 spray  1 spray Mouth/Throat PRN Janine Ores K, [name]      . ondansetron [facility]) tablet 4 mg  4 mg Oral Q6H PRN Janine Ores K, [name]       Or  . ondansetron [facility]) injection 4 mg  4 mg Intravenous Q6H PRN Janine Ores K, [name]   4 mg at 02/13/20 1237  . polyethylene glycol (MIRALAX / GLYCOLAX) packet 17 g  17 g Oral Daily PRN Janine Ores K, [name]      . potassium chloride (KLOR-CON) CR tablet 10 mEq  10 mEq Oral Daily Alwyn Ren, MD   10 mEq at 02/17/20 0943  . predniSONE (DELTASONE) tablet 5 mg  5 mg Oral Daily Janine Ores K, [name]   5 mg at 02/17/20 0943  . senna (SENOKOT) tablet 8.6 mg  1 tablet Oral BID Janine Ores K, [name]   8.6 mg at 02/16/20 2111  . vitamin B-12 (CYANOCOBALAMIN) tablet 1,000 mcg  1,000 mcg Oral Daily Janine Ores K, [name]   1,000 mcg at 02/17/20 0944  . zolpidem (AMBIEN) tablet 5 mg  5 mg Oral QHS PRN Briant Cedar, MD   5 mg at 02/16/20 2111     Discharge Medications: Please see discharge summary for a list of discharge medications.  Relevant Imaging Results:  Relevant Lab Results:   Additional Information SSN: [id number]  [id number]  [place], [place]

## 2020-02-17 NOTE — Consult Note (Signed)
[facility] Face-to-Face Psychiatry Consult   Reason for Consult:   Referring Physician:   Patient Identification: Tara Brown MRN:  [id number] Principal Diagnosis: <principal problem not specified> Diagnosis:  Active Problems:   Hip fx (HCC)   Closed nondisplaced fracture of left acetabulum (HCC)   Pressure injury of skin   Protein-calorie malnutrition, severe   Fall   Total Time spent with patient: 30 minutes  Subjective:   Tara Brown is a 59 y.o. female patient admitted after a fall. Per patient " I fell and broke my leg. Yes I am depressed I just found out that I have to give up everything. How am I supposed to feel after losing everything? " Patient reports history of intermittent depression, and denies any current recent or active suicidal thoughts. She is currently taking anti depressant therapy and open to therapy at the new facility if she is able to afford it. Discussed with patient that her feelings and expressions are normal for someone who was previously independent and now is requiring assistance for around the clock care. She states she has acknowledged this and currently in agreeance with the plan for SNF/LTAC.  At this time will psych clear patient.   HPI:  Tara Brown a 59 y.o.femalewith medical history significant for rheumatoid arthritis with extensive scoliosis, major depressive disorder, hypertension, osteopenia, insomnia severe malnutrition presents to the hospitals/p fall, found down at home by health aide was on floor for over12hours. Patient states she had a mechanical fall. She notes no loc or head trauma , but s/p fall unable to get up and note b/l hip pain and back pain. In the ED, VSS, labs stable from prior. Xray showed right subtrochanteric femur fracture, left periprosthetic acetabular fracture. Orthopedics consulted. Patient admitted for further management.   Past Psychiatric History: Depression currently taking Lexapro 20mg  and Wellbutrin 150mg   po daily.   Risk to Self:   Risk to Others:   Prior Inpatient Therapy:   Prior Outpatient Therapy:    Past Medical History:  Past Medical History:  Diagnosis Date  . Anemia   . Arthritis   . 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 Osteoporosis     Past Surgical History:  Procedure Laterality Date  . AMPUTATION Left 12/07/2015   Procedure: AMPUTATION LEFT GREAT TOE;  Surgeon: 03/13/2015, MD;  Location: [facility];  Service: Orthopedics;  Laterality: Left;  . FOOT SURGERY    . HAND SURGERY    . HIP FRACTURE SURGERY    . INTRAMEDULLARY (IM) NAIL INTERTROCHANTERIC Right 02/05/2020   Procedure: RIGHT HIP, INTRAMEDULLARY NAIL FIXATION;  Surgeon: Nadara Mustard, MD;  Location: [facility];  Service: Orthopedics;  Laterality: Right;  . KNEE SURGERY    . NECK SURGERY     Family History:  Family History  Problem Relation Age of Onset  . Bipolar disorder Sister   . Suicidality Other   . Schizophrenia Other   . Stroke Neg Hx    Family Psychiatric  History: Denies Social History:  Social History   Substance and Sexual Activity  Alcohol Use No     Social History   Substance and Sexual Activity  Drug Use No    Social History   Socioeconomic History  . Marital status: Divorced    Spouse name: Not on file  . Number of children: Not on file  . Years of education: Not on file  . Highest education level: Not on file  Occupational History  .  Not on file  Tobacco Use  . Smoking status: Never Smoker  . Smokeless tobacco: Never Used  Substance and Sexual Activity  . Alcohol use: No  . Drug use: No  . Sexual activity: Never    Birth control/protection: None  Other Topics Concern  . Not on file  Social History Narrative  . Not on file   Social Determinants of Health   Financial Resource Strain:   . Difficulty of Paying Living Expenses:   Food Insecurity:   . Worried About Charity fundraiser in the Last Year:   . Academic librarian in the Last Year:   Transportation Needs:   . Film/video editor (Medical):   Marland Kitchen Lack of Transportation (Non-Medical):   Physical Activity:   . Days of Exercise per Week:   . Minutes of Exercise per Session:   Stress:   . Feeling of Stress :   Social Connections:   . Frequency of Communication with Friends and Family:   . Frequency of Social Gatherings with Friends and Family:   . Attends Religious Services:   . Active Member of Clubs or Organizations:   . Attends Archivist Meetings:   Marland Kitchen Marital Status:    Additional Social History:    Allergies:  No Known Allergies  Labs: No results found for this or any previous visit (from the past 48 hour(s)).  Current Facility-Administered Medications  Medication Dose Route Frequency Provider Last Rate Last Admin  . 0.9 %  sodium chloride infusion   Intravenous PRN Georgette Shell, MD 10 mL/hr at 02/14/20 0124 250 mL at 02/14/20 0124  . acetaminophen (TYLENOL) tablet 325-650 mg  325-650 mg Oral Q6H PRN Merlene Pulling K, [name]      . albuterol (PROVENTIL) (2.5 MG/3ML) 0.083% nebulizer solution 2.5 mg  2.5 mg Nebulization Q4H PRN Alma Friendly, MD      . alum & mag hydroxide-simeth (MAALOX/MYLANTA) 200-200-20 MG/5ML suspension 30 mL  30 mL Oral Q4H PRN Merlene Pulling K, [name]   30 mL at 02/13/20 1752  . bisacodyl (DULCOLAX) suppository 10 mg  10 mg Rectal Daily PRN Merlene Pulling K, [name]      . buPROPion (WELLBUTRIN XL) 24 hr tablet 150 mg  150 mg Oral Daily Merlene Pulling K, [name]   150 mg at 02/16/20 0955  . chlorhexidine (PERIDEX) 0.12 % solution 15 mL  15 mL Mouth Rinse BID Alma Friendly, MD   15 mL at 02/16/20 2110  . Chlorhexidine Gluconate Cloth 2 % PADS 6 each  6 each Topical Daily Alma Friendly, MD   6 each at 02/16/20 1020  . docusate sodium (COLACE) capsule 100 mg  100 mg Oral BID Merlene Pulling K, [name]   100 mg at 02/16/20 2111  . enoxaparin (LOVENOX) injection 30 mg  30 mg Subcutaneous Q24H  Prudencio Burly III, [name]   30 mg at 02/16/20 1021  . escitalopram (LEXAPRO) tablet 20 mg  20 mg Oral Daily Merlene Pulling K, [name]   20 mg at 02/16/20 0957  . feeding supplement (ENSURE ENLIVE) (ENSURE ENLIVE) liquid 237 mL  237 mL Oral TID BM Alma Friendly, MD   237 mL at 02/16/20 2015  . ferrous sulfate tablet 325 mg  325 mg Oral Q breakfast Alma Friendly, MD   325 mg at 02/15/20 0919  . hydrALAZINE (APRESOLINE) injection 10 mg  10 mg Intravenous Q8H PRN Alma Friendly, MD   10 mg  at 02/12/20 0720  . HYDROcodone-acetaminophen (NORCO) 10-325 MG per tablet 1 tablet  1 tablet Oral Q4H PRN Janine Ores K, [name]   1 tablet at 02/17/20 0606  . lisinopril (ZESTRIL) tablet 20 mg  20 mg Oral Daily Briant Cedar, MD   20 mg at 02/13/20 0949  . MEDLINE mouth rinse  15 mL Mouth Rinse q12n4p Briant Cedar, MD   15 mL at 02/16/20 1159  . menthol-cetylpyridinium (CEPACOL) lozenge 3 mg  1 lozenge Oral PRN Janine Ores K, [name]       Or  . phenol (CHLORASEPTIC) mouth spray 1 spray  1 spray Mouth/Throat PRN Janine Ores K, [name]      . ondansetron [facility]) tablet 4 mg  4 mg Oral Q6H PRN Janine Ores K, [name]       Or  . ondansetron [facility]) injection 4 mg  4 mg Intravenous Q6H PRN Janine Ores K, [name]   4 mg at 02/13/20 1237  . polyethylene glycol (MIRALAX / GLYCOLAX) packet 17 g  17 g Oral Daily PRN Janine Ores K, [name]      . potassium chloride (KLOR-CON) CR tablet 10 mEq  10 mEq Oral Daily Alwyn Ren, MD   10 mEq at 02/16/20 0956  . predniSONE (DELTASONE) tablet 5 mg  5 mg Oral Daily Janine Ores K, [name]   5 mg at 02/16/20 0955  . senna (SENOKOT) tablet 8.6 mg  1 tablet Oral BID Janine Ores K, [name]   8.6 mg at 02/16/20 2111  . vitamin B-12 (CYANOCOBALAMIN) tablet 1,000 mcg  1,000 mcg Oral Daily Janine Ores K, [name]   1,000 mcg at 02/16/20 0957  . zolpidem (AMBIEN) tablet 5 mg  5 mg Oral QHS PRN Briant Cedar, MD   5 mg at 02/16/20 2111     Musculoskeletal: Strength & Muscle Tone: within normal limits Gait & Station: normal Patient leans: N/A  Psychiatric Specialty Exam: Physical Exam  Review of Systems  Blood pressure 97/70, pulse 65, temperature 98.6 F (37 C), temperature source Oral, resp. rate 18, height 5\' 4"  (1.626 m), weight 41.7 kg, SpO2 92 %.Body mass index is 15.78 kg/m.  General Appearance: Fairly Groomed and frail   Eye Contact:  Fair  Speech:  Clear and Coherent and Slow  Volume:  Decreased  Mood:  Depressed  Affect:  Appropriate and Congruent  Thought Process:  Coherent, Linear and Descriptions of Associations: Intact  Orientation:  Full (Time, Place, and Person)  Thought Content:  Logical  Suicidal Thoughts:  No  Homicidal Thoughts:  No  Memory:  Immediate;   Fair Recent;   Fair  Judgement:  Intact  Insight:  Fair  Psychomotor Activity:  Psychomotor Retardation  Concentration:  Concentration: Fair and Attention Span: Fair  Recall:  of Knowledge:  Fair  Language:  Fair  Akathisia:  No  Handed:  Right  AIMS (if indicated):     Assets:  Communication Skills Desire for Improvement Financial Resources/Insurance Leisure Time Social Support  ADL's:  Intact  Cognition:  WNL  Sleep:        Treatment Plan Summary: Plan Recommend continuing home medications. Will recommend increasing wellbutrin 300mg  po daily. Patient is open to therapy and or chaplain at this time due to acute stressors. WIll psych clear at this time, please reconsult with psychiatry for further assistance.   Disposition: No evidence of imminent risk to self or others at present.   Patient does not meet criteria for  psychiatric inpatient admission. Supportive therapy provided about ongoing stressors. Discussed crisis plan, support from social network, calling 911, coming to the Emergency Department, and calling Suicide Hotline.  Maryagnes Amos, FNP 02/17/2020 9:36 AM

## 2020-02-17 NOTE — Plan of Care (Signed)
  Problem: Education: Goal: Knowledge of General Education information will improve Description: Including pain rating scale, medication(s)/side effects and non-pharmacologic comfort measures Outcome: Progressing   Problem: Pain Managment: Goal: General experience of comfort will improve Outcome: Progressing   Problem: Safety: Goal: Ability to remain free from injury will improve Outcome: Progressing   Problem: Skin Integrity: Goal: Risk for impaired skin integrity will decrease Outcome: Progressing   Problem: Clinical Measurements: Goal: Postoperative complications will be avoided or minimized Outcome: Progressing   Problem: Skin Integrity: Goal: Demonstration of wound healing without infection will improve Outcome: Progressing   

## 2020-02-17 NOTE — Progress Notes (Signed)
Nutrition Follow-up  DOCUMENTATION CODES:   Severe malnutrition in context of chronic illness, Underweight  INTERVENTION:  - continue Ensure Enlive (prefers strawberry flavor) TID and greek yogurt for HS snack. - weigh patient today.   NUTRITION DIAGNOSIS:   Severe Malnutrition related to chronic illness(RA and scoliosis) as evidenced by moderate fat depletion, moderate muscle depletion, severe fat depletion, severe muscle depletion. -ongoing  GOAL:   Patient will meet greater than or equal to 90% of their needs -unmet  MONITOR:   PO intake, Supplement acceptance, Labs, Weight trends  ASSESSMENT:   59 y.o. female with medical history of rheumatoid arthritis, extensive scoliosis, major depressive disorder, HTN, osteopenia, insomnia, and severe malnutrition. She presented to the ED after a fall at home; she was on the floor for >12 hours.  She has not been weighed since 5/28. She has been accepting Ensure Enlive 50% of the time offered. Per flow sheet documentation patient recently consumed the following at meals:  6/5- 50% of dinner 6/6- 15% of breakfast, 25% of lunch 6/7- 25% of lunch, 100% of dinner  Patient laying in bed and drinking what appeared to be juice. RN at bedside. Patient requested orange sherbet and RN states plan to also get patient an Ensure shortly.  Patient denies any nutrition-related questions or concerns at this time.   Per notes, patient is medically stable and pending SNF placement.     Labs reviewed; Na: 133 mmol/l, Cl: 88 mmol/l, Ca: 8.3 mg/dl. Medications reviewed; 100 mg colace BID, 325 mg ferrous sulfate/day, 10 mEq Klor-Con/day, 5 mg deltasone/day, 1 tablet senokot BID, 1000 mcg oral cyanocobalamin/day.   Diet Order:   Diet Order            DIET SOFT Room service appropriate? Yes; Fluid consistency: Thin  Diet effective now              EDUCATION NEEDS:   No education needs have been identified at this time  Skin:  Skin Assessment:  Skin Integrity Issues: Skin Integrity Issues:: Stage I, Incisions Stage I: sacrum Incisions: R hip (5/27)  Last BM:  6/6  Height:   Ht Readings from Last 1 Encounters:  02/06/20 5\' 4"  (1.626 m)    Weight:   Wt Readings from Last 1 Encounters:  02/06/20 41.7 kg     Estimated Nutritional Needs:  Kcal:  1600-1800 kcal Protein:  75-85 grams Fluid:  >/= 1.8 L/day     02/08/20, MS, RD, LDN, CNSC Inpatient Clinical Dietitian RD pager # available in [facility]  After hours/weekend pager # available in [facility]

## 2020-02-17 NOTE — Progress Notes (Addendum)
PROGRESS NOTE    Tara Brown  VOP:[id number] DOB: August 12, 1961 DOA: 02/04/2020 PCP: [name], [name]    Brief Narrative:HPI from Dr Silas Flood Hocottis a 59 y.o.femalewith medical history significant for rheumatoid arthritis with extensive scoliosis, major depressive disorder, hypertension, osteopenia, insomnia severe malnutrition presents to the hospitals/p fall, found down at home by health aide was on floor for over12hours. Patient states she had a mechanical fall. She notes no loc or head trauma , but s/p fall unable to get up and note b/l hip pain and back pain. In the ED, VSS, labs stable from prior. Xray showed right subtrochanteric femur fracture, left periprosthetic acetabular fracture. Orthopedics consulted. Patient admitted for further management  Assessment & Plan:   Active Problems:   Hip fx (HCC)   Closed nondisplaced fracture of left acetabulum (HCC)   Pressure injury of skin   Protein-calorie malnutrition, severe   Fall   #1right subtrochanteric femur fracture and left periprosthetic acetabular fracture status post right hip IM nail 02/05/2020 by Dr. Dion Saucier. Orthosigned off.  #2 acute urinary retention improving after IV fluids and difficult Foley placement by urology. Patient will have to be discharged with the Foley in place and follow-up with urology as an outpatient.  #3 rheumatoid arthritis/extensive scoliosis/osteopenia multiple falls with fracture on prednisone and weekly Fosamax. Methotrexate on hold.  Restart on discharge.  #4 acute hypoxic hypercapnic respiratory failure-chest x-ray shows findings consistent with possible pulmonary edema.Resolved. Patient on room air.  #5 Abla/B12/iron deficiency Baseline hemoglobin approximately around 8. She received 2 units of blood transfusion on 02/05/2020 after surgery. Status post Feraheme 1 dose 02/05/2020. Continue oral iron and B12 supplementation. Stable  #6 essential  hypertension-blood pressure very soft today.  Decrease antihypertensives and I have put holding parameters.  We will give her 500 cc normal saline.  Zestril decreased to 5 mg.  #7 depression on Lexapro and Wellbutrin.  She feels very helpless and hopeless.  I have consulted psychiatry for any recommendations.  #8 severe malnutrition follow dietary recommendations  #9 goals of care appreciate palliative input.Pending SNF/ltac  #10 right and left sacrum stage I pressure injury present on admission continue local wound care  Pressure Injury 02/06/20 Sacrum Left;Right Stage 1 -  Intact skin with non-blanchable redness of a localized area usually over a bony prominence. (Active)  02/06/20 0235  Location: Sacrum  Location Orientation: Left;Right  Staging: Stage 1 -  Intact skin with non-blanchable redness of a localized area usually over a bony prominence.  Wound Description (Comments):   Present on Admission: Yes      Nutrition Problem: Severe Malnutrition Etiology: chronic illness(RA and scoliosis)     Signs/Symptoms: moderate fat depletion, moderate muscle depletion, severe fat depletion, severe muscle depletion    Interventions: Ensure Enlive (each supplement provides 350kcal and 20 grams of protein), Snacks  Estimated body mass index is 16.12 kg/m as calculated from the following:   Height as of this encounter: 5\' 4"  (1.626 m).   Weight as of this encounter: 42.6 kg.  DVT prophylaxis:Lovenox Code Status:DO NOT RESUSCITATE Family Communication:None at bedside Disposition Plan:Status is: Inpatient  Dispo: The patient is from:Home Anticipated d/c is to:PT OT recommends SNF/insurance authorization pending Anticipated d/c date Patient currentlyis medically stable to d/c.Pending SNF  Consultants:  Orthopedics  Procedures:Right hip IM nailing 02/05/2020 Antimicrobials:None   Subjective: Staff  reports that patient was very tearful and depressed And today she tells me she is depressed about her life which she could go home and be  home does not really want to go to the nursing home and spend the rest of her life in a nursing home but she does know she is not safe to be home alone  Objective: Vitals:   02/16/20 1322 02/16/20 2157 02/17/20 0547 02/17/20 1100  BP: 131/77 109/64 97/70   Pulse: 68 73 65   Resp: 18 20 18    Temp: 98.4 F (36.9 C) 97.8 F (36.6 C) 98.6 F (37 C)   TempSrc: Oral Oral Oral   SpO2: 96% 98% 92%   Weight:    42.6 kg  Height:        Intake/Output Summary (Last 24 hours) at 02/17/2020 1407 Last data filed at 02/17/2020 1229 Gross per 24 hour  Intake 920 ml  Output 4200 ml  Net -3280 ml   Filed Weights   02/05/20 1151 02/06/20 0246 02/17/20 1100  Weight: 47 kg 41.7 kg 42.6 kg    Examination:  General exam: Appears calm and comfortable  Respiratory system: Clear to auscultation. Respiratory effort normal. Cardiovascular system: S1 & S2 heard, RRR. No JVD, murmurs, rubs, gallops or clicks. No pedal edema. Gastrointestinal system: Abdomen is nondistended, soft and nontender. No organomegaly or masses felt. Normal bowel sounds heard. Central nervous system: Alert and oriented. No focal neurological deficits. Extremities: Contracted upper and lower extremities Skin: No rashes, lesions or ulcers Psychiatry: Judgement and insight appear normal. Mood & affect appropriate.     Data Reviewed: I have personally reviewed following labs and imaging studies  CBC: Recent Labs  Lab 02/11/20 0226 02/14/20 1318  WBC 9.0 12.6*  NEUTROABS 6.9  --   HGB 10.0* 10.1*  HCT 33.9* 34.8*  MCV 94.2 92.8  PLT 424* 361   Basic Metabolic Panel: Recent Labs  Lab 02/11/20 0226 02/14/20 1318  NA 139 133*  K 3.6 4.2  CL 96* 86*  CO2 33* 34*  GLUCOSE 122* 104*  BUN 19 20  CREATININE 0.58 0.57  CALCIUM 8.2* 8.3*   GFR: Estimated Creatinine Clearance: 50.3  mL/min (by C-G formula based on SCr of 0.57 mg/dL). Liver Function Tests: Recent Labs  Lab 02/14/20 1318  AST 26  ALT 11  ALKPHOS 76  BILITOT 0.7  PROT 6.1*  ALBUMIN 2.8*   No results for input(s): LIPASE, AMYLASE in the last 168 hours. No results for input(s): AMMONIA in the last 168 hours. Coagulation Profile: No results for input(s): INR, PROTIME in the last 168 hours. Cardiac Enzymes: No results for input(s): CKTOTAL, CKMB, CKMBINDEX, TROPONINI in the last 168 hours. BNP (last 3 results) No results for input(s): PROBNP in the last 8760 hours. HbA1C: No results for input(s): HGBA1C in the last 72 hours. CBG: No results for input(s): GLUCAP in the last 168 hours. Lipid Profile: No results for input(s): CHOL, HDL, LDLCALC, TRIG, CHOLHDL, LDLDIRECT in the last 72 hours. Thyroid Function Tests: No results for input(s): TSH, T4TOTAL, FREET4, T3FREE, THYROIDAB in the last 72 hours. Anemia Panel: No results for input(s): VITAMINB12, FOLATE, FERRITIN, TIBC, IRON, RETICCTPCT in the last 72 hours. Sepsis Labs: No results for input(s): PROCALCITON, LATICACIDVEN in the last 168 hours.  No results found for this or any previous visit (from the past 240 hour(s)).       Radiology Studies: No results found.      Scheduled Meds: . buPROPion  150 mg Oral Daily  . chlorhexidine  15 mL Mouth Rinse BID  . Chlorhexidine Gluconate Cloth  6 each Topical Daily  . docusate sodium  100 mg Oral BID  . enoxaparin (LOVENOX) injection  30 mg Subcutaneous Q24H  . escitalopram  20 mg Oral Daily  . feeding supplement (ENSURE ENLIVE)  237 mL Oral TID BM  . ferrous sulfate  325 mg Oral Q breakfast  . lisinopril  20 mg Oral Daily  . mouth rinse  15 mL Mouth Rinse q12n4p  . potassium chloride  10 mEq Oral Daily  . predniSONE  5 mg Oral Daily  . senna  1 tablet Oral BID  . vitamin B-12  1,000 mcg Oral Daily   Continuous Infusions: . sodium chloride 250 mL (02/14/20 0124)     LOS: 12  days      Georgette Shell, MD  02/17/2020, 2:07 PM

## 2020-02-17 NOTE — Progress Notes (Signed)
Daily Progress Note   Patient Name: Tara Brown       Date: 02/17/2020 DOB: 05-15-1961  Age: 59 y.o. MRN#: [id number] Attending Physician: Georgette Shell, MD Primary Care Physician: Iva Lento, [name] Admit Date: 02/04/2020  Reason for Consultation/Follow-up: Establishing goals of care  Subjective: I met today with Ms. Bohman.  Discussed with LCSW who has been looking to facilitate transition to long term care as she was declined for rehab trial and is not safe to discharge back to her home.  She was lying in bed eating on my arrival.  No distress and she was the most alert and talkative that I have seen this admission.  Discussed concerns about her living alone and working to transition to long term care.  She becomes quiet and reports she wishes this were not the case but she understands concern for her safety.  Discussed that as we are looking for long term and not rehab placement, she may be able to have hospice services on discharge as well if desired.  She had asked for hospice services on discharge this admission.  She stated that she does not feel that she is "there yet" to consider hospice services.    Length of Stay: 12  Current Medications: Scheduled Meds:  . buPROPion  150 mg Oral Daily  . chlorhexidine  15 mL Mouth Rinse BID  . Chlorhexidine Gluconate Cloth  6 each Topical Daily  . docusate sodium  100 mg Oral BID  . enoxaparin (LOVENOX) injection  30 mg Subcutaneous Q24H  . escitalopram  20 mg Oral Daily  . feeding supplement (ENSURE ENLIVE)  237 mL Oral TID BM  . ferrous sulfate  325 mg Oral Q breakfast  . lisinopril  20 mg Oral Daily  . mouth rinse  15 mL Mouth Rinse q12n4p  . potassium chloride  10 mEq Oral Daily  . predniSONE  5 mg Oral Daily  . senna   1 tablet Oral BID  . vitamin B-12  1,000 mcg Oral Daily    Continuous Infusions: . sodium chloride 250 mL (02/14/20 0124)    PRN Meds: sodium chloride, acetaminophen, albuterol, alum & mag hydroxide-simeth, bisacodyl, hydrALAZINE, HYDROcodone-acetaminophen, menthol-cetylpyridinium **OR** phenol, ondansetron **OR** ondansetron (ZOFRAN) IV, polyethylene glycol, zolpidem  Physical Exam        General: Frail and  chronically ill-appearing, cachectic with severe scoliosis noted Heart: Regular rate and rhythm. No murmur appreciated. Lungs: Tachypnea, decreased air movement Abdomen: Soft, nontender, nondistended, positive bowel sounds.  Ext: No significant edema, joint deformity in upper and lower extremities with noted contracture and RA nodules Skin: Warm and dry Neuro: Grossly intact, nonfocal.   Vital Signs: BP 97/70 (BP Location: Right Arm)   Pulse 65   Temp 98.6 F (37 C) (Oral)   Resp 18   Ht _0  (1.626 m)   Wt 41.7 kg   SpO2 92%   BMI 15.78 kg/m  SpO2: SpO2: 92 % O2 Device: O2 Device: Room Air O2 Flow Rate: O2 Flow Rate (L/min): 2 L/min  Intake/output summary:   Intake/Output Summary (Last 24 hours) at 02/17/2020 0947 Last data filed at 02/17/2020 0935 Gross per 24 hour  Intake 1034 ml  Output 3300 ml  Net -2266 ml   LBM: Last BM Date: 02/15/20 Baseline Weight: Weight: 47 kg Most recent weight: Weight: 41.7 kg   Flowsheet Rows     Most Recent Value  Intake Tab  Referral Department  Hospitalist  Unit at Time of Referral  Intermediate Care Unit  Palliative Care Primary Diagnosis  Trauma  Date Notified  02/10/20  Palliative Care Type  New Palliative care  Reason for referral  Clarify Goals of Care  Date of Admission  02/04/20  Date first seen by Palliative Care  02/10/20  # of days Palliative referral response time  0 Day(s)  # of days IP prior to Palliative referral  6  Clinical Assessment  Palliative Performance Scale Score  40%  Psychosocial & Spiritual  Assessment  Palliative Care Outcomes  Patient/Family meeting held?  Yes  Who was at the meeting?  Patient, Father via phone      Patient Active Problem List   Diagnosis Date Noted  . Fall   . Pressure injury of skin 02/06/2020  . Protein-calorie malnutrition, severe 02/06/2020  . Hip fx ([facility]) 02/05/2020  . Closed nondisplaced fracture of left acetabulum ([facility]) 02/05/2020  . MDD (major depressive disorder) 02/17/2017  . Osteoporosis 05/29/2016  . HTN (hypertension) 05/29/2016  . MDD (major depressive disorder), recurrent severe, without psychosis ([facility]) 05/28/2016  . Rheumatoid arthritis ([facility]) 12/06/2015  . Anemia of chronic disease 03/08/2015    Palliative Care Assessment & Plan   Patient Profile:  Recommendations/Plan: Today, Makenize tells me she is not interested in hospice services at this point.  Discussed plan to work toward long term care and recommended palliative care follow-up as outpatient.  She was agreeable to this.   Code Status:    Code Status Orders  (From admission, onward)         Start     Ordered   02/10/20 0816  Do not attempt resuscitation (DNR)  Continuous    Question Answer Comment  In the event of cardiac or respiratory ARREST Do not call a "code blue"   In the event of cardiac or respiratory ARREST Do not perform Intubation, CPR, defibrillation or ACLS   In the event of cardiac or respiratory ARREST Use medication by any route, position, wound care, and other measures to relive pain and suffering. May use oxygen, suction and manual treatment of airway obstruction as needed for comfort.      02/10/20 0815            Prognosis:  Guarded  Discharge Planning: To Be Determined- Long term care?  Care plan was discussed with patient  Thank you for allowing the Palliative Medicine Team to assist in the care of this patient.   Total Time 20 Prolonged Time Billed No      Greater than 50%  of this time was spent counseling and  coordinating care related to the above assessment and plan.  Micheline Rough, MD  Please contact Palliative Medicine Team phone at [phone] for questions and concerns.

## 2020-02-17 NOTE — Care Management Important Message (Signed)
Important Message  Patient Details IM Letter given to [name] SW Case Manager to present to the Patient Name: Tara Brown MRN: [id number] Date of Birth: 22-Aug-1961   Medicare Important Message Given:  Yes     Caren Macadam 02/17/2020, 1:42 PM

## 2020-02-17 NOTE — TOC Progression Note (Addendum)
Transition of Care [facility]) - Progression Note    Patient Details  Name: Tara Brown MRN: [id number] Date of Birth: Oct 30, 1960  [facility]) CM/SW Contact  Clearance Coots, LCSW Phone Number: 02/17/2020, 2:33 PM  Clinical Narrative:    [facility] agreeable to accept the patient pending medicaid insurance benefit. Patient agreeable to sign over her disability check until her medicaid is finalize. CSW informed the patient at bedside, she is agreeable. Patient express concern about her belongings in her apartment. She reports, " they(family)  want me in a facility then they can figure out how to move my stuff."  CSW notified the patient Father and sister Duwayne Heck . Patient family will assist with transferring her belongings from the apartment.   Patient covid test pending. PASRR recently completed expires 03/13/2020   Expected Discharge Plan: Skilled Nursing Facility Barriers to Discharge: Other (comment)(Covid-19 Test Pending.)  Expected Discharge Plan and Services Expected Discharge Plan: Skilled Nursing Facility In-house Referral: Clinical Social Work   Post Acute Care Choice: Skilled Nursing Facility Living arrangements for the past 2 months: Apartment                                       Social Determinants of Health (SDOH) Interventions    Readmission Risk Interventions Readmission Risk Prevention Plan 02/16/2020  Transportation Screening Complete  PCP or Specialist Appt within 5-7 Days Not Complete  Home Care Screening Complete  Medication Review (RN CM) Complete  Some recent data might be hidden

## 2020-02-18 DIAGNOSIS — E43 Unspecified severe protein-calorie malnutrition: Secondary | ICD-10-CM

## 2020-02-18 MED ORDER — DOCUSATE SODIUM 100 MG PO CAPS: 100.0000 mg | ORAL_CAPSULE | Freq: Two times a day (BID) | ORAL | Status: AC

## 2020-02-18 MED ORDER — BUPROPION HCL ER (XL) 300 MG PO TB24: 300.0000 mg | ORAL_TABLET | Freq: Every day | ORAL | Status: AC

## 2020-02-18 MED ORDER — ZOLPIDEM TARTRATE 5 MG PO TABS: 5.0000 mg | ORAL_TABLET | Freq: Every evening | ORAL | 0 refills | Status: AC | PRN

## 2020-02-18 MED ORDER — ENSURE ENLIVE PO LIQD: 237.0000 mL | Freq: Three times a day (TID) | ORAL | Status: AC

## 2020-02-18 MED ORDER — SENNA 8.6 MG PO TABS: 1.0000 | ORAL_TABLET | Freq: Two times a day (BID) | ORAL | 0 refills | Status: AC

## 2020-02-18 MED ORDER — ZOLPIDEM TARTRATE 5 MG PO TABS: 5.0000 mg | ORAL_TABLET | Freq: Every evening | ORAL | Status: DC | PRN

## 2020-02-18 MED ORDER — POLYETHYLENE GLYCOL 3350 17 G PO PACK: 17.0000 g | PACK | Freq: Every day | ORAL | 0 refills | Status: AC | PRN

## 2020-02-18 MED ORDER — LISINOPRIL 5 MG PO TABS: 5.0000 mg | ORAL_TABLET | Freq: Every day | ORAL | Status: AC

## 2020-02-18 MED ORDER — FERROUS SULFATE 325 (65 FE) MG PO TABS: 325.0000 mg | ORAL_TABLET | Freq: Every day | ORAL | Status: AC

## 2020-02-18 NOTE — Plan of Care (Signed)
  Problem: Pain Managment: Goal: General experience of comfort will improve Outcome: Adequate for Discharge   Problem: Safety: Goal: Ability to remain free from injury will improve Outcome: Adequate for Discharge   Problem: Skin Integrity: Goal: Risk for impaired skin integrity will decrease Outcome: Adequate for Discharge   Problem: Clinical Measurements: Goal: Postoperative complications will be avoided or minimized Outcome: Adequate for Discharge   Problem: Skin Integrity: Goal: Demonstration of wound healing without infection will improve Outcome: Adequate for Discharge

## 2020-02-18 NOTE — Discharge Summary (Addendum)
Physician Discharge Summary  Valle Poirrier VQQ:[id number] DOB: 05/23/1961 DOA: 02/04/2020  PCP: [name], [name]  Admit date: 02/04/2020 Discharge date: 02/18/2020  Admitted From: Home Disposition: SNF  Recommendations for Outpatient Follow-up:  1. Follow up with PCP in 1 week 2. Follow up with Dr. Ronne Binning, Urology, for voiding trial in 1 week 3. Follow up with Dr. Dion Saucier, Orthopedic surgery, in 2 weeks 4. Follow up with cardiology for murmur 5. Recommend palliative care referral 6. Please follow up on the following pending results: None   Discharge Condition: Stable CODE STATUS: DNR Diet recommendation: Heart healthy   Brief/Interim Summary:  Admission HPI written by [name], MD   Chief Complaint:  Fall at home   HPI: Tara Brown is a 58 y.o. female with medical history significant of  rheumatoid arthritis with extensive scoliosis, major depressive disorder, hypertension, osteopenia, insomnia severe malnutrition presents to the hospital s/p fall, found down at home by health aide was on floor for over 12 hours.  Patient states she has a mechanical fall. She notes no loc or head trauma , but s/p fall unable to get up and note b/l hip pain and back pain. On ros she notes no sob, chest pain, n/v/d/dysuria, fever /chills, cough, noted wt is stable, no change in appetite.     Hospital course:  Right subtrochanteric femur fracture Left periprosthetic acetabular fracture Patient is s/p right hip IM nail on 02/05/20. Recommendations for weight bearing as tolerated. Patient to follow up with orthopedic surgery in 2 weeks. Discharged with Percocet for pain management and Lovenox for VTE prophylaxis.  Acute urinary retention Patient required foley placement by urology. No voiding trial attempted secondary to difficulty of foley placement. Recommendation for outpatient urology follow-up.  Arthritis Patient's chart notes rheumatoid arthritis but patient  states she has psoriatic arthritis. She appears to have significant disease. She is on methotrexate and prednisone as an outpatient. Continue on discharge.  Acute respiratory failure with hypoxia and hypercapnia Chest x-ray significant for likely interstitial edema and pleural effusion but symptoms resolved. Given IV lasix. Possibly related to recent surgery. Recommendation for outpatient Transthoracic Echocardiogram and cardiology follow-up.  Acute blood loss anemia Iron deficiency anemia Continue iron supplementation  Essential hypertension Lisnopril decreased to 5 mg daily  Murmur Possibly new. Loud. Could not find history of echocardiogram. Since noted on day of discharge, recommend outpatient follow-up with cardiology. Hemodynamically stable.  Depression Patient was evaluated by psychiatry with recommendations to increase to Wellbutrin XL 300 mg daily  Severe malnutrition Ensure  Goals of care DNR, palliative care follow-up outpatient.  Sacral ulcer Present on admission. Stage 1 left sacrum.  Discharge Diagnoses:  Active Problems:   Hip fx (HCC)   Closed nondisplaced fracture of left acetabulum (HCC)   Pressure injury of skin   Protein-calorie malnutrition, severe   Fall   Closed fracture of left iliac crest [facility])    Discharge Instructions  Discharge Instructions    Diet - low sodium heart healthy   Complete by: As directed    Increase activity slowly   Complete by: As directed    No wound care   Complete by: As directed      Allergies as of 02/18/2020   No Known Allergies     Medication List    STOP taking these medications   hydrOXYzine 25 MG tablet Commonly known as: ATARAX/VISTARIL   oxyCODONE-acetaminophen 5-325 MG tablet Commonly known as: PERCOCET/ROXICET   sertraline 50 MG tablet Commonly known as: ZOLOFT  traZODone 50 MG tablet Commonly known as: DESYREL     TAKE these medications   alendronate 70 MG tablet Commonly known as:  FOSAMAX Take 70 mg by mouth once a week. fridays   aspirin 81 MG chewable tablet Chew 81 mg by mouth daily.   buPROPion 300 MG 24 hr tablet Commonly known as: WELLBUTRIN XL Take 1 tablet (300 mg total) by mouth daily. What changed:   medication strength  how much to take   docusate sodium 100 MG capsule Commonly known as: COLACE Take 1 capsule (100 mg total) by mouth 2 (two) times daily.   enoxaparin 30 MG/0.3ML injection Commonly known as: Lovenox Inject 0.3 mLs (30 mg total) into the skin daily.   escitalopram 20 MG tablet Commonly known as: LEXAPRO Take 20 mg by mouth daily.   feeding supplement (ENSURE ENLIVE) Liqd Take 237 mLs by mouth 3 (three) times daily between meals.   ferrous sulfate 325 (65 FE) MG tablet Take 1 tablet (325 mg total) by mouth daily with breakfast. Start taking on: February 19, 2020   HYDROcodone-acetaminophen 10-325 MG tablet Commonly known as: Norco Take 1 tablet by mouth every 6 (six) hours as needed for up to 5 days.   lisinopril 5 MG tablet Commonly known as: ZESTRIL Take 1 tablet (5 mg total) by mouth daily. Start taking on: February 19, 2020 What changed:   medication strength  how much to take   methotrexate 2.5 MG tablet Commonly known as: RHEUMATREX Take 12.5 mg by mouth once a week. Caution:Chemotherapy. Protect from light. Wednesdays   polyethylene glycol 17 g packet Commonly known as: MIRALAX / GLYCOLAX Take 17 g by mouth daily as needed for mild constipation.   predniSONE 5 MG tablet Commonly known as: DELTASONE Take 5 mg by mouth daily.   senna 8.6 MG Tabs tablet Commonly known as: SENOKOT Take 1 tablet (8.6 mg total) by mouth 2 (two) times daily.   zolpidem 5 MG tablet Commonly known as: AMBIEN Take 1 tablet (5 mg total) by mouth at bedtime as needed. What changed:   medication strength  how much to take       Contact information for follow-up providers    Teryl Lucy, MD. Schedule an appointment as soon  as possible for a visit in 2 weeks.   Specialty: Orthopedic Surgery Contact information: [address] [place] [address] [phone]        [facility]. Schedule an appointment as soon as possible for a visit.   Why: Heart murmur Contact information: [address] [place] [address] [phone]           Contact information for after-discharge care    Destination    [facility] .   Service: Skilled Nursing Contact information: [address] [place] [address] [phone]                 No Known Allergies  Consultations:  Orthopedic surgery   Procedures/Studies: DG Chest 2 View  Result Date: 02/04/2020 CLINICAL DATA:  Fall EXAM: CHEST - 2 VIEW COMPARISON:  01/08/2020 FINDINGS: Examination is limited by severe scoliosis. Lateral view limited by patient's arms and positioning. No gross pleural effusion. Linear atelectasis at the left base. Stable cardiomediastinal silhouette. Old appearing bilateral rib fractures. No gross pneumothorax allowing for positioning and arc technique. Chronic deformities of the proximal humeri. IMPRESSION: Limited by positioning and technique. Subsegmental atelectasis or scar at the left  base. Electronically Signed   By: Donavan Foil M.D.   On: 02/04/2020 18:15   DG Knee 1-2 Views Right  Result Date: 02/04/2020 CLINICAL DATA:  Fall with pain EXAM: RIGHT KNEE - 1-2 VIEW COMPARISON:  None. FINDINGS: Prior knee replacement with intact hardware and normal alignment. Bones appear osteopenic. No acute displaced fracture or malalignment. No large knee effusion. Diffuse soft tissue atrophy. Possible chronic fracture deformity of the proximal shaft of the fibula IMPRESSION: Right knee replacement without acute osseous abnormality Electronically Signed   By: Donavan Foil M.D.   On: 02/04/2020 18:28   CT ABDOMEN PELVIS W CONTRAST  Result  Date: 02/04/2020 CLINICAL DATA:  [place], found down, right hip and pelvic pain EXAM: CT ABDOMEN AND PELVIS WITH CONTRAST TECHNIQUE: Multidetector CT imaging of the abdomen and pelvis was performed using the standard protocol following bolus administration of intravenous contrast. CONTRAST:  67mL OMNIPAQUE IOHEXOL 300 MG/ML  SOLN COMPARISON:  08/17/2010, 02/04/2020 FINDINGS: Lower chest: Hypoventilatory changes are seen at the left lung base. No acute pleural or parenchymal lung disease. Hepatobiliary: Liver compatible with hemangioma 2.6 cm hypodensity right lobe. No intrahepatic duct dilation. The gallbladder is unremarkable. Pancreas: Unremarkable. No pancreatic ductal dilatation or surrounding inflammatory changes. Spleen: Normal in size without focal abnormality. Adrenals/Urinary Tract: There is bilateral renal cortical atrophy, with small bilateral renal cortical cysts. No urinary tract calculi or obstructive uropathy. Bladder is decompressed. The adrenals are unremarkable. Stomach/Bowel: There is a large amount of stool within the rectal vault consistent with fecal impaction. No bowel obstruction or ileus. No bowel wall thickening or inflammatory change. Vascular/Lymphatic: Aortic atherosclerosis. No enlarged abdominal or pelvic lymph nodes. Reproductive: Uterus and bilateral adnexa are unremarkable. Other: No free fluid or free gas.  No abdominal wall hernia. Musculoskeletal: There is a mildly comminuted left iliac bone fracture, extending through the left acetabular roof. Fracture line extends along the superolateral margin of the acetabular component of the left hip arthroplasty. There is a markedly comminuted and impacted inter trochanteric proximal right femur fracture. Postsurgical changes right iliac crest. Prior healed bilateral pubic rami fractures. Reconstructed images demonstrate no additional findings. IMPRESSION: 1. Comminuted proximal right femoral intertrochanteric fracture. 2. Comminuted fracture  left iliac bone, involving the superior lateral aspect of the femoral component of the left hip arthroplasty. 3. Fecal impaction. Electronically Signed   By: Randa Ngo M.D.   On: 02/04/2020 20:26   Pelvis Portable  Result Date: 02/05/2020 CLINICAL DATA:  Status post right intramedullary rod fixation. EXAM: PORTABLE PELVIS 1-2 VIEWS COMPARISON:  Jan 11, 2020. FINDINGS: Status post left total hip arthroplasty. Status post intramedullary rod fixation of mildly displaced subtrochanteric fracture of the proximal right femur. Expected postoperative changes are noted in the soft tissues. IMPRESSION: Status post intramedullary rod fixation of mildly displaced subtrochanteric fracture of proximal right femur. Electronically Signed   By: Marijo Conception M.D.   On: 02/05/2020 19:04   CT L-SPINE NO CHARGE  Result Date: 02/04/2020 CLINICAL DATA:  [place], hip fracture EXAM: CT LUMBAR SPINE WITHOUT CONTRAST TECHNIQUE: Multidetector CT imaging of the lumbar spine was performed without intravenous contrast administration. Multiplanar CT image reconstructions were also generated. COMPARISON:  04/24/2019 FINDINGS: Segmentation: 5 non-rib-bearing lumbar type vertebral bodies. Alignment: Mild left convex curvature centered at the thoracolumbar junction. Otherwise alignment is anatomic. Vertebrae: No acute displaced fractures. Paraspinal and other soft tissues: Significant retained stool within the rectum. Paraspinal soft tissues are unremarkable. Disc levels: There is prominent spondylosis at T11/T12, without significant compressive  sequela. Disc spaces of the lumbar spine are well preserved. IMPRESSION: 1. No acute displaced lumbar spine fracture. Electronically Signed   By: Sharlet Salina M.D.   On: 02/04/2020 20:29   DG Chest Port 1 View  Result Date: 02/10/2020 CLINICAL DATA:  59 year old female with hypoxia. Recent surgical repair of right femur fracture. Chronic scoliosis. EXAM: PORTABLE CHEST 1 VIEW COMPARISON:   02/07/2020 portable chest and earlier. FINDINGS: Portable AP semi upright view at 0822 hours. Severe dextroconvex thoracic scoliosis again noted. Chronic rib and proximal humerus fractures. Progressive increased right lung opacity, and increased bibasilar density. Stable cardiac size and mediastinal contours. No pneumothorax. Increased pulmonary vascularity. Visualized tracheal air column is within normal limits. IMPRESSION: 1. Increasing bilateral lung base and additional indistinct right lung opacity. This probably in part reflects bilateral pleural effusions, and consider interstitial edema. Although the additional right lung opacity is also suspicious for pneumonia. 2. Severe scoliosis, and chronic skeletal trauma. Electronically Signed   By: Odessa Fleming M.D.   On: 02/10/2020 08:36   DG Chest Port 1 View  Result Date: 02/07/2020 CLINICAL DATA:  Acute respiratory failure with hypoxia. Tara Brown with history of rheumatoid arthritis and scoliosis EXAM: PORTABLE CHEST 1 VIEW COMPARISON:  Chest radiograph 02/06/2020 FINDINGS: Evaluation limited by severe scoliosis. Stable cardiomediastinal contours. There are new heterogeneous opacities at the right lung base. Left lung appears clear. Old bilateral rib fractures. No evidence of pneumothorax or significant pleural effusion. Chronic deformities of the bilateral humeral heads. IMPRESSION: New opacities at the right lung base, which may represent atelectasis and/or infection. Electronically Signed   By: Emmaline Kluver M.D.   On: 02/07/2020 16:08   DG CHEST PORT 1 VIEW  Result Date: 02/06/2020 CLINICAL DATA:  Hypoxia EXAM: PORTABLE CHEST 1 VIEW COMPARISON:  02/04/2020 FINDINGS: Unchanged severe thoracic scoliosis. No focal airspace consolidation. Cardiomediastinal contours are normal. IMPRESSION: Unchanged examination of the chest without acute airspace disease. Electronically Signed   By: Deatra Robinson M.D.   On: 02/06/2020 02:45   DG C-Arm 1-60 Min-No Report  Result  Date: 02/05/2020 Fluoroscopy was utilized by the requesting physician.  No radiographic interpretation.   DG Hip Port Unilat With Pelvis 1V Right  Result Date: 02/05/2020 CLINICAL DATA:  Post right IM nail EXAM: DG HIP (WITH OR WITHOUT PELVIS) 1V PORT RIGHT COMPARISON:  02/05/2020, 02/04/2020 FINDINGS: Only a single view is submitted. Previous left hip replacement with numerous wire fragments and surgical mesh. Interval intramedullary rodding of the right femur for comminuted intertrochanteric fracture. Gas in the soft tissues consistent with recent surgery IMPRESSION: Interval intramedullary rodding of right femur for comminuted intertrochanteric fracture Electronically Signed   By: Jasmine Pang M.D.   On: 02/05/2020 19:05   DG FEMUR, MIN 2 VIEWS RIGHT  Result Date: 02/05/2020 CLINICAL DATA:  Intramedullary rod fixation of right femur. EXAM: RIGHT FEMUR 2 VIEWS Radiation exposure index: 23.567 mGy. COMPARISON:  Feb 04, 2020. FINDINGS: Four intraoperative fluoroscopic images were obtained of the right femur. These images demonstrate intramedullary rod fixation of proximal right femoral fracture. Improved alignment of fracture components is noted. IMPRESSION: Status post intramedullary rod fixation of proximal right femoral fracture. Electronically Signed   By: Lupita Raider M.D.   On: 02/05/2020 19:05   DG Hips Bilat W or Wo Pelvis 5 Views  Result Date: 02/04/2020 CLINICAL DATA:  Larey Seat at night EXAM: DG HIP (WITH OR WITHOUT PELVIS) 5+V BILAT COMPARISON:  12/05/2019, 12/03/2019, 11/09/2016 FINDINGS: Previous surgical plate and multiple screw fixation of  the right iliac bone with multiple screw fracture and screw fragments. Acute comminuted fracture involving the right trochanter and subtrochanteric femur with varus angulation the distal fracture fragment and less than 1/4 shaft diameter displacement of distal fragment toward the midline. The femoral head on the right is normally position. Prior left hip  replacement without dislocation. Surgical mesh and multiple wire fragments are again visualized. Amorphous material caudal to the femoral stem without change. Acute mildly displaced fracture through the left iliac bone, extending from the crest to the lateral aspect of the acetabulum. Chronic appearing fracture deformities of the bilateral pubic rami. IMPRESSION: 1. Prior left hip replacement without dislocation. Acute mildly displaced fracture within the left iliac bone extending from the crest to the superolateral acetabular margin. 2. Acute mildly comminuted, displaced and angulated fracture involving the proximal right femur, involves the trochanter and subtrochanteric femur. Electronically Signed   By: Jasmine Pang M.D.   On: 02/04/2020 18:27      Subjective: Did not get sleep because her Ambien was discontinued. No other issues.  Discharge Exam: Vitals:   02/17/20 2053 02/18/20 0425  BP: 116/76 121/73  Pulse: 67 62  Resp: 16 18  Temp: 98.1 F (36.7 C) 98.4 F (36.9 C)  SpO2: 93% 96%   Vitals:   02/17/20 1100 02/17/20 1414 02/17/20 2053 02/18/20 0425  BP:  106/64 116/76 121/73  Pulse:  71 67 62  Resp:  17 16 18   Temp:  98.2 F (36.8 C) 98.1 F (36.7 C) 98.4 F (36.9 C)  TempSrc:   Oral Oral  SpO2:  95% 93% 96%  Weight: 42.6 kg     Height:        General: Tara Brown is alert, awake, not in acute distress Cardiovascular: RRR, S1/S2 +, no rubs, no gallops. 2/6 mid-systolic murmur Respiratory: CTA bilaterally, no wheezing, no rhonchi Abdominal: Soft, NT, ND, bowel sounds + Extremities: no cyanosis    The results of significant diagnostics from this hospitalization (including imaging, microbiology, ancillary and laboratory) are listed below for reference.     Microbiology: Recent Results (from the past 240 hour(s))  SARS Coronavirus 2 by RT PCR (hospital order, performed in [facility] hospital lab) Nasopharyngeal Nasopharyngeal Swab     Status: None   Collection Time:  02/17/20 12:47 PM   Specimen: Nasopharyngeal Swab  Result Value Ref Range Status   SARS Coronavirus 2 NEGATIVE NEGATIVE Final    Comment: (NOTE) SARS-CoV-2 target nucleic acids are NOT DETECTED. The SARS-CoV-2 RNA is generally detectable in upper and lower respiratory specimens during the acute phase of infection. The lowest concentration of SARS-CoV-2 viral copies this assay can detect is 250 copies / mL. A negative result does not preclude SARS-CoV-2 infection and should not be used as the sole basis for treatment or other patient management decisions.  A negative result may occur with improper specimen collection / handling, submission of specimen other than nasopharyngeal swab, presence of viral mutation(s) within the areas targeted by this assay, and inadequate number of viral copies (<250 copies / mL). A negative result must be combined with clinical observations, patient history, and epidemiological information. Fact Sheet for Patients:   04/18/20 Fact Sheet for Healthcare Providers: BoilerBrush.com.cy This test is not yet approved or cleared  by the https://pope.com/ FDA and has been authorized for detection and/or diagnosis of SARS-CoV-2 by FDA under an Emergency Use Authorization (EUA).  This EUA will remain in effect (meaning this test can be used) for the duration of the  COVID-19 declaration under Section 564(b)(1) of the Act, 21 U.S.C. section 360bbb-3(b)(1), unless the authorization is terminated or revoked sooner. Performed at [facility], [address], [place], [place] [address]      Labs: BNP (last 3 results) No results for input(s): BNP in the last 8760 hours. Basic Metabolic Panel: Recent Labs  Lab 02/14/20 1318 02/17/20 1450  NA 133* 133*  K 4.2 4.9  CL 86* 90*  CO2 34* 35*  GLUCOSE 104* 100*  BUN 20 16  CREATININE 0.57 0.61  CALCIUM 8.3* 8.7*   Liver Function Tests: Recent  Labs  Lab 02/14/20 1318 02/17/20 1450  AST 26 20  ALT 11 11  ALKPHOS 76 102  BILITOT 0.7 0.5  PROT 6.1* 6.9  ALBUMIN 2.8* 2.9*   No results for input(s): LIPASE, AMYLASE in the last 168 hours. No results for input(s): AMMONIA in the last 168 hours. CBC: Recent Labs  Lab 02/14/20 1318 02/17/20 1450  WBC 12.6* 10.6*  HGB 10.1* 10.1*  HCT 34.8* 33.8*  MCV 92.8 89.7  PLT 361 334   Cardiac Enzymes: No results for input(s): CKTOTAL, CKMB, CKMBINDEX, TROPONINI in the last 168 hours. BNP: Invalid input(s): POCBNP CBG: No results for input(s): GLUCAP in the last 168 hours. D-Dimer No results for input(s): DDIMER in the last 72 hours. Hgb A1c No results for input(s): HGBA1C in the last 72 hours. Lipid Profile No results for input(s): CHOL, HDL, LDLCALC, TRIG, CHOLHDL, LDLDIRECT in the last 72 hours. Thyroid function studies No results for input(s): TSH, T4TOTAL, T3FREE, THYROIDAB in the last 72 hours.  Invalid input(s): FREET3 Anemia work up No results for input(s): VITAMINB12, FOLATE, FERRITIN, TIBC, IRON, RETICCTPCT in the last 72 hours. Urinalysis    Component Value Date/Time   COLORURINE AMBER (A) 08/07/2015 1020   APPEARANCEUR CLOUDY (A) 08/07/2015 1020   LABSPEC 1.021 08/07/2015 1020   PHURINE 6.0 08/07/2015 1020   GLUCOSEU NEGATIVE 08/07/2015 1020   HGBUR LARGE (A) 08/07/2015 1020   BILIRUBINUR MODERATE (A) 08/07/2015 1020   KETONESUR 15 (A) 08/07/2015 1020   PROTEINUR 100 (A) 08/07/2015 1020   UROBILINOGEN 2.0 (H) 03/07/2015 1832   NITRITE NEGATIVE 08/07/2015 1020   LEUKOCYTESUR LARGE (A) 08/07/2015 1020   Sepsis Labs Invalid input(s): PROCALCITONIN,  WBC,  LACTICIDVEN Microbiology Recent Results (from the past 240 hour(s))  SARS Coronavirus 2 by RT PCR (hospital order, performed in [facility] hospital lab) Nasopharyngeal Nasopharyngeal Swab     Status: None   Collection Time: 02/17/20 12:47 PM   Specimen: Nasopharyngeal Swab  Result Value Ref Range  Status   SARS Coronavirus 2 NEGATIVE NEGATIVE Final    Comment: (NOTE) SARS-CoV-2 target nucleic acids are NOT DETECTED. The SARS-CoV-2 RNA is generally detectable in upper and lower respiratory specimens during the acute phase of infection. The lowest concentration of SARS-CoV-2 viral copies this assay can detect is 250 copies / mL. A negative result does not preclude SARS-CoV-2 infection and should not be used as the sole basis for treatment or other patient management decisions.  A negative result may occur with improper specimen collection / handling, submission of specimen other than nasopharyngeal swab, presence of viral mutation(s) within the areas targeted by this assay, and inadequate number of viral copies (<250 copies / mL). A negative result must be combined with clinical observations, patient history, and epidemiological information. Fact Sheet for Patients:   BoilerBrush.com.cy Fact Sheet for Healthcare Providers: https://pope.com/ This test is not yet approved or cleared  by  the [employer] and has been authorized for detection and/or diagnosis of SARS-CoV-2 by FDA under an Emergency Use Authorization (EUA).  This EUA will remain in effect (meaning this test can be used) for the duration of the COVID-19 declaration under Section 564(b)(1) of the Act, 21 U.S.C. section 360bbb-3(b)(1), unless the authorization is terminated or revoked sooner. Performed at [facility], [address] [place], [place] [address]      Time coordinating discharge: 35 minutes  SIGNED:   Jacquelin Hawking, MD [facility] 02/18/2020, 11:00 AM

## 2020-02-18 NOTE — TOC Transition Note (Signed)
Transition of Care [facility]) - CM/SW Discharge Note   Patient Details  Name: Labrenda Lasky MRN: [id number] Date of Birth: 1961-09-10  Transition of Care [facility]) CM/SW Contact:  Clearance Coots, LCSW Phone Number: 02/18/2020, 10:25 AM   Clinical Narrative:    [facility] ready to accept the patient.  Nurse call report to:[phone] Room 201B PTAR to transport Patent father Michele Mcalpine and sister Duwayne Heck notified.   Final next level of care: Skilled Nursing Facility Barriers to Discharge: No Barriers Identified   Patient Goals and CMS Choice Patient states their goals for this hospitalization and ongoing recovery are:: get better CMS Medicare.gov Compare Post Acute Care list provided to:: Patient Choice offered to / list presented to : Patient  Discharge Placement   Existing PASRR number confirmed : 02/17/20          Patient chooses bed at: [facility] Patient to be transferred to facility by: PTAR Name of family member notified: Father-Phil Patient and family notified of of transfer: 02/18/20  Discharge Plan and Services In-house Referral: Clinical Social Work   Post Acute Care Choice: Skilled Nursing Facility                               Social Determinants of Health (SDOH) Interventions     Readmission Risk Interventions Readmission Risk Prevention Plan 02/16/2020  Transportation Screening Complete  PCP or Specialist Appt within 5-7 Days Not Complete  Home Care Screening Complete  Medication Review (RN CM) Complete  Some recent data might be hidden

## 2020-02-18 NOTE — Progress Notes (Signed)
Reported to transport EMT. Pain medication last given at 1530. Patient being d/c in [facility] condition.

## 2020-02-18 NOTE — Progress Notes (Signed)
Attempt to call patient report x3. To phone number given [phone]. No answer- left message with name and direct callback number.

## 2020-02-18 NOTE — Progress Notes (Signed)
Patient repositioned q2 hours and more. Patient comfortable at this time.

## 2020-02-18 NOTE — Plan of Care (Signed)
  Problem: Education: Goal: Knowledge of General Education information will improve Description: Including pain rating scale, medication(s)/side effects and non-pharmacologic comfort measures 02/18/2020 1713 by Minette Brine, RN Outcome: Adequate for Discharge 02/18/2020 1157 by Jermany Sundell, Petra Kuba, RN Outcome: Adequate for Discharge   Problem: Pain Managment: Goal: General experience of comfort will improve 02/18/2020 1713 by Phaedra Colgate, Petra Kuba, RN Outcome: Adequate for Discharge 02/18/2020 1157 by Minette Brine, RN Outcome: Adequate for Discharge   Problem: Safety: Goal: Ability to remain free from injury will improve 02/18/2020 1713 by Jamale Spangler, Petra Kuba, RN Outcome: Adequate for Discharge 02/18/2020 1157 by Minette Brine, RN Outcome: Adequate for Discharge   Problem: Skin Integrity: Goal: Risk for impaired skin integrity will decrease 02/18/2020 1713 by Minette Brine, RN Outcome: Adequate for Discharge 02/18/2020 1157 by Minette Brine, RN Outcome: Adequate for Discharge   Problem: Clinical Measurements: Goal: Postoperative complications will be avoided or minimized 02/18/2020 1713 by Minette Brine, RN Outcome: Adequate for Discharge 02/18/2020 1157 by Minette Brine, RN Outcome: Adequate for Discharge   Problem: Skin Integrity: Goal: Demonstration of wound healing without infection will improve 02/18/2020 1713 by Minette Brine, RN Outcome: Adequate for Discharge 02/18/2020 1157 by Torre Schaumburg, Petra Kuba, RN Outcome: Adequate for Discharge

## 2020-03-03 ENCOUNTER — Ambulatory Visit: Payer: Medicare HMO | Admitting: Cardiology

## 2020-06-21 ENCOUNTER — Telehealth: Payer: Self-pay | Admitting: *Deleted

## 2022-01-09 DEATH — deceased
